# Patient Record
Sex: Female | Born: 1970
Health system: Southern US, Community
[De-identification: ages and names within clinical notes are randomized; demographics above are authoritative.]

## PROBLEM LIST (undated history)

## (undated) DIAGNOSIS — N289 Disorder of kidney and ureter, unspecified: Secondary | ICD-10-CM

## (undated) DIAGNOSIS — E119 Type 2 diabetes mellitus without complications: Secondary | ICD-10-CM

## (undated) DIAGNOSIS — I10 Essential (primary) hypertension: Secondary | ICD-10-CM

## (undated) DIAGNOSIS — R1310 Dysphagia, unspecified: Secondary | ICD-10-CM

## (undated) DIAGNOSIS — E039 Hypothyroidism, unspecified: Secondary | ICD-10-CM

## (undated) DIAGNOSIS — K829 Disease of gallbladder, unspecified: Secondary | ICD-10-CM

## (undated) DIAGNOSIS — K219 Gastro-esophageal reflux disease without esophagitis: Secondary | ICD-10-CM

## (undated) DIAGNOSIS — J40 Bronchitis, not specified as acute or chronic: Secondary | ICD-10-CM

## (undated) DIAGNOSIS — I499 Cardiac arrhythmia, unspecified: Secondary | ICD-10-CM

## (undated) DIAGNOSIS — D329 Benign neoplasm of meninges, unspecified: Secondary | ICD-10-CM

## (undated) DIAGNOSIS — F32A Depression, unspecified: Secondary | ICD-10-CM

## (undated) DIAGNOSIS — G629 Polyneuropathy, unspecified: Secondary | ICD-10-CM

## (undated) DIAGNOSIS — Z9889 Other specified postprocedural states: Secondary | ICD-10-CM

## (undated) DIAGNOSIS — R112 Nausea with vomiting, unspecified: Secondary | ICD-10-CM

## (undated) DIAGNOSIS — E221 Hyperprolactinemia: Secondary | ICD-10-CM

## (undated) DIAGNOSIS — H8110 Benign paroxysmal vertigo, unspecified ear: Secondary | ICD-10-CM

## (undated) DIAGNOSIS — R011 Cardiac murmur, unspecified: Secondary | ICD-10-CM

## (undated) DIAGNOSIS — K5900 Constipation, unspecified: Secondary | ICD-10-CM

## (undated) DIAGNOSIS — M2550 Pain in unspecified joint: Secondary | ICD-10-CM

## (undated) DIAGNOSIS — F319 Bipolar disorder, unspecified: Secondary | ICD-10-CM

## (undated) DIAGNOSIS — F419 Anxiety disorder, unspecified: Secondary | ICD-10-CM

## (undated) DIAGNOSIS — E559 Vitamin D deficiency, unspecified: Secondary | ICD-10-CM

## (undated) DIAGNOSIS — IMO0001 Reserved for inherently not codable concepts without codable children: Secondary | ICD-10-CM

## (undated) DIAGNOSIS — R600 Localized edema: Secondary | ICD-10-CM

## (undated) DIAGNOSIS — K8010 Calculus of gallbladder with chronic cholecystitis without obstruction: Secondary | ICD-10-CM

## (undated) DIAGNOSIS — E282 Polycystic ovarian syndrome: Secondary | ICD-10-CM

## (undated) DIAGNOSIS — T753XXA Motion sickness, initial encounter: Secondary | ICD-10-CM

## (undated) DIAGNOSIS — E785 Hyperlipidemia, unspecified: Secondary | ICD-10-CM

## (undated) DIAGNOSIS — G4730 Sleep apnea, unspecified: Secondary | ICD-10-CM

## (undated) DIAGNOSIS — R42 Dizziness and giddiness: Secondary | ICD-10-CM

## (undated) DIAGNOSIS — M549 Dorsalgia, unspecified: Secondary | ICD-10-CM

## (undated) HISTORY — DX: Type 2 diabetes mellitus without complications: E11.9

## (undated) HISTORY — DX: Benign neoplasm of meninges, unspecified: D32.9

## (undated) HISTORY — PX: GALLBLADDER SURGERY: SHX652

## (undated) HISTORY — DX: Disorder of kidney and ureter, unspecified: N28.9

## (undated) HISTORY — DX: Benign paroxysmal vertigo, unspecified ear: H81.10

## (undated) HISTORY — DX: Hypothyroidism, unspecified: E03.9

## (undated) HISTORY — DX: Dysphagia, unspecified: R13.10

## (undated) HISTORY — DX: Constipation, unspecified: K59.00

## (undated) HISTORY — DX: Hyperprolactinemia: E22.1

## (undated) HISTORY — DX: Pain in unspecified joint: M25.50

## (undated) HISTORY — DX: Localized edema: R60.0

## (undated) HISTORY — DX: Polyneuropathy, unspecified: G62.9

## (undated) HISTORY — DX: Anxiety disorder, unspecified: F41.9

## (undated) HISTORY — DX: Depression, unspecified: F32.A

## (undated) HISTORY — DX: Hyperlipidemia, unspecified: E78.5

## (undated) HISTORY — DX: Vitamin D deficiency, unspecified: E55.9

## (undated) HISTORY — DX: Disease of gallbladder, unspecified: K82.9

## (undated) HISTORY — DX: Dorsalgia, unspecified: M54.9

## (undated) HISTORY — DX: Calculus of gallbladder with chronic cholecystitis without obstruction: K80.10

## (undated) HISTORY — DX: Essential (primary) hypertension: I10

## (undated) HISTORY — PX: CHOLECYSTECTOMY: SHX55

## (undated) HISTORY — DX: Gastro-esophageal reflux disease without esophagitis: K21.9

## (undated) HISTORY — PX: DENTAL SURGERY: SHX609

## (undated) HISTORY — DX: Polycystic ovarian syndrome: E28.2

---

## 2010-07-12 HISTORY — PX: ABDOMINAL HYSTERECTOMY: SHX81

## 2010-07-12 HISTORY — PX: TOTAL ABDOMINAL HYSTERECTOMY: SHX209

## 2012-06-21 DIAGNOSIS — E039 Hypothyroidism, unspecified: Secondary | ICD-10-CM | POA: Insufficient documentation

## 2012-06-21 DIAGNOSIS — E221 Hyperprolactinemia: Secondary | ICD-10-CM | POA: Insufficient documentation

## 2012-06-21 DIAGNOSIS — E063 Autoimmune thyroiditis: Secondary | ICD-10-CM | POA: Insufficient documentation

## 2012-06-21 DIAGNOSIS — E282 Polycystic ovarian syndrome: Secondary | ICD-10-CM | POA: Insufficient documentation

## 2012-06-21 DIAGNOSIS — R635 Abnormal weight gain: Secondary | ICD-10-CM | POA: Insufficient documentation

## 2012-06-21 HISTORY — DX: Hyperprolactinemia: E22.1

## 2012-06-21 HISTORY — DX: Polycystic ovarian syndrome: E28.2

## 2012-06-21 HISTORY — DX: Hypothyroidism, unspecified: E03.9

## 2012-08-08 DIAGNOSIS — R110 Nausea: Secondary | ICD-10-CM | POA: Insufficient documentation

## 2012-08-14 DIAGNOSIS — E119 Type 2 diabetes mellitus without complications: Secondary | ICD-10-CM

## 2012-08-14 DIAGNOSIS — K8010 Calculus of gallbladder with chronic cholecystitis without obstruction: Secondary | ICD-10-CM

## 2012-08-14 DIAGNOSIS — R899 Unspecified abnormal finding in specimens from other organs, systems and tissues: Secondary | ICD-10-CM | POA: Insufficient documentation

## 2012-08-14 DIAGNOSIS — F309 Manic episode, unspecified: Secondary | ICD-10-CM | POA: Insufficient documentation

## 2012-08-14 DIAGNOSIS — K8020 Calculus of gallbladder without cholecystitis without obstruction: Secondary | ICD-10-CM | POA: Insufficient documentation

## 2012-08-14 DIAGNOSIS — D3000 Benign neoplasm of unspecified kidney: Secondary | ICD-10-CM | POA: Insufficient documentation

## 2012-08-14 DIAGNOSIS — F319 Bipolar disorder, unspecified: Secondary | ICD-10-CM | POA: Insufficient documentation

## 2012-08-14 DIAGNOSIS — D1771 Benign lipomatous neoplasm of kidney: Secondary | ICD-10-CM | POA: Insufficient documentation

## 2012-08-14 DIAGNOSIS — R7303 Prediabetes: Secondary | ICD-10-CM | POA: Insufficient documentation

## 2012-08-14 HISTORY — DX: Calculus of gallbladder with chronic cholecystitis without obstruction: K80.10

## 2012-08-14 HISTORY — DX: Type 2 diabetes mellitus without complications: E11.9

## 2013-08-25 ENCOUNTER — Ambulatory Visit: Payer: Self-pay | Admitting: Emergency Medicine

## 2013-08-25 LAB — RAPID STREP-A WITH REFLX: Micro Text Report: NEGATIVE

## 2013-08-28 LAB — BETA STREP CULTURE(ARMC)

## 2013-08-31 ENCOUNTER — Ambulatory Visit: Payer: Self-pay

## 2014-02-14 DIAGNOSIS — H8110 Benign paroxysmal vertigo, unspecified ear: Secondary | ICD-10-CM | POA: Insufficient documentation

## 2014-02-14 DIAGNOSIS — I10 Essential (primary) hypertension: Secondary | ICD-10-CM | POA: Insufficient documentation

## 2014-02-14 DIAGNOSIS — I152 Hypertension secondary to endocrine disorders: Secondary | ICD-10-CM | POA: Insufficient documentation

## 2014-02-14 HISTORY — DX: Essential (primary) hypertension: I10

## 2014-02-26 ENCOUNTER — Ambulatory Visit: Payer: Self-pay | Admitting: Physician Assistant

## 2014-03-30 ENCOUNTER — Ambulatory Visit: Payer: Self-pay

## 2014-05-06 ENCOUNTER — Ambulatory Visit: Payer: Self-pay | Admitting: Physician Assistant

## 2014-05-06 LAB — RAPID STREP-A WITH REFLX: MICRO TEXT REPORT: NEGATIVE

## 2014-05-08 LAB — BETA STREP CULTURE(ARMC)

## 2014-07-12 DIAGNOSIS — R42 Dizziness and giddiness: Secondary | ICD-10-CM

## 2014-07-12 HISTORY — DX: Dizziness and giddiness: R42

## 2014-09-24 DIAGNOSIS — R002 Palpitations: Secondary | ICD-10-CM | POA: Insufficient documentation

## 2015-03-01 ENCOUNTER — Ambulatory Visit
Admission: EM | Admit: 2015-03-01 | Discharge: 2015-03-01 | Disposition: A | Discharge status: Home / Self Care | Payer: 59 | Attending: Family Medicine | Admitting: Family Medicine

## 2015-03-01 ENCOUNTER — Encounter: Payer: Self-pay | Admitting: Emergency Medicine

## 2015-03-01 DIAGNOSIS — T148 Other injury of unspecified body region: Secondary | ICD-10-CM

## 2015-03-01 DIAGNOSIS — W57XXXA Bitten or stung by nonvenomous insect and other nonvenomous arthropods, initial encounter: Secondary | ICD-10-CM | POA: Diagnosis not present

## 2015-03-01 DIAGNOSIS — R197 Diarrhea, unspecified: Secondary | ICD-10-CM | POA: Diagnosis not present

## 2015-03-01 DIAGNOSIS — J069 Acute upper respiratory infection, unspecified: Secondary | ICD-10-CM | POA: Diagnosis not present

## 2015-03-01 HISTORY — DX: Bipolar disorder, unspecified: F31.9

## 2015-03-01 HISTORY — DX: Type 2 diabetes mellitus without complications: E11.9

## 2015-03-01 HISTORY — DX: Essential (primary) hypertension: I10

## 2015-03-01 MED ORDER — MUPIROCIN 2 % EX OINT: 1.0000 "application " | TOPICAL_OINTMENT | Freq: Three times a day (TID) | CUTANEOUS | Status: DC

## 2015-03-01 MED ORDER — DOXYCYCLINE HYCLATE 100 MG PO CAPS: 100.0000 mg | ORAL_CAPSULE | Freq: Two times a day (BID) | ORAL | Status: DC

## 2015-03-01 MED ORDER — HYDROCOD POLST-CPM POLST ER 10-8 MG/5ML PO SUER: 5.0000 mL | Freq: Two times a day (BID) | ORAL | Status: DC

## 2015-03-01 NOTE — Discharge Instructions (Signed)
DEET Insect Repellent  DEET is a commonly used insect repellent. DEET is effective against mosquitoes, ticks, and chiggers.DEET is not effective against stinging insects, such as bees and wasps. When mosquitoes or ticks are active, take the following precautions.  Use DEET according to the directions on the label.  Wear protective clothing if you are outside in an area where there are weeds, tall grass, or bushes. This includes long pants, socks, and loose-fitting, long-sleeved shirts. Consider spraying DEET on your clothing. Avoid being outdoors in the early evening. This is when mosquitoes are most active.  Products with a low concentration of DEET (10% to 20%) may be useful in areas with few insects. Higher concentrations of DEET may be needed in areas with many insects. Repellents used on children should not contain more than 30% DEET. Although higher concentrations of DEET (up to 95%) are available for adults, they are not recommended for routine use. Concentrations higher than 50% do not provide additional protection. Depending on the concentration of DEET in a product, it can be effective for about 2 to 6 hours.  When applying DEET to children, use the lowest concentration that is effective. Ten percent DEET will last approximately 2 to 3 hours, while 30% will last 4 to 5 hours. Do not use DEET on infants younger than 2 months old. Do not apply DEET more often than once a day to children under the age of 2.  Avoid prolonged or excessive use of DEET. Use it sparingly to cover exposed skin and clothing. Adverse reactions to DEET in the recommended concentrations are uncommon. However, skin irritation can occur in some people.  Wash all treated skin and clothing with soap and water after returning indoors.  Do not allow children to apply insect repellent themselves.  Do not apply DEET near cuts or open wounds. You can apply DEET and sunscreen together. However, it is recommend that you apply  the sunscreen first.  Do not apply DEET to a child's hands or near a child's eyes and mouth. If DEET is accidentally sprayed in the eyes, wash the eyes out with large amounts of water.  Store DEET out of the reach of children.  Most authorities feel that it is safe to use DEET during pregnancy. However, pregnant women should only use insect repellents when they are in areas with a high risk of disease carried by insects (malaria, West Nile virus, encephalitis). Document Released: 03/23/2001 Document Revised: 11/12/2013 Document Reviewed: 03/17/2011 [facility] Patient Information 2015 [place], [place]. This information is not intended to replace advice given to you by your health care provider. Make sure you discuss any questions you have with your health care provider.  Diarrhea Diarrhea is frequent loose and watery bowel movements. It can cause you to feel weak and dehydrated. Dehydration can cause you to become tired and thirsty, have a dry mouth, and have decreased urination that often is dark yellow. Diarrhea is a sign of another problem, most often an infection that will not last long. In most cases, diarrhea typically lasts 2-3 days. However, it can last longer if it is a sign of something more serious. It is important to treat your diarrhea as directed by your caregiver to lessen or prevent future episodes of diarrhea. CAUSES  Some common causes include:  Gastrointestinal infections caused by viruses, bacteria, or parasites.  Food poisoning or food allergies.  Certain medicines, such as antibiotics, chemotherapy, and laxatives.  Artificial sweeteners and fructose.  Digestive disorders. HOME CARE INSTRUCTIONS  Ensure adequate fluid intake (hydration): Have 1 cup (8 oz) of fluid for each diarrhea episode. Avoid fluids that contain simple sugars or sports drinks, fruit juices, whole milk products, and sodas. Your urine should be clear or pale yellow if you are drinking enough fluids.  Hydrate with an oral rehydration solution that you can purchase at pharmacies, retail stores, and online. You can prepare an oral rehydration solution at home by mixing the following ingredients together:   - tsp table salt.   tsp baking soda.   tsp salt substitute containing potassium chloride.  1  tablespoons sugar.  1 L (34 oz) of water.  Certain foods and beverages may increase the speed at which food moves through the gastrointestinal (GI) tract. These foods and beverages should be avoided and include:  Caffeinated and alcoholic beverages.  High-fiber foods, such as raw fruits and vegetables, nuts, seeds, and whole grain breads and cereals.  Foods and beverages sweetened with sugar alcohols, such as xylitol, sorbitol, and mannitol.  Some foods may be well tolerated and may help thicken stool including:  Starchy foods, such as rice, toast, pasta, low-sugar cereal, oatmeal, grits, baked potatoes, crackers, and bagels.  Bananas.  Applesauce.  Add probiotic-rich foods to help increase healthy bacteria in the GI tract, such as yogurt and fermented milk products.  Wash your hands well after each diarrhea episode.  Only take over-the-counter or prescription medicines as directed by your caregiver.  Take a warm bath to relieve any burning or pain from frequent diarrhea episodes. SEEK IMMEDIATE MEDICAL CARE IF:   You are unable to keep fluids down.  You have persistent vomiting.  You have blood in your stool, or your stools are black and tarry.  You do not urinate in 6-8 hours, or there is only a small amount of very dark urine.  You have abdominal pain that increases or localizes.  You have weakness, dizziness, confusion, or light-headedness.  You have a severe headache.  Your diarrhea gets worse or does not get better.  You have a fever or persistent symptoms for more than 2-3 days.  You have a fever and your symptoms suddenly get worse. MAKE SURE YOU:    Understand these instructions.  Will watch your condition.  Will get help right away if you are not doing well or get worse. Document Released: 06/18/2002 Document Revised: 11/12/2013 Document Reviewed: 03/05/2012 [facility] Patient Information 2015 [place], [place]. This information is not intended to replace advice given to you by your health care provider. Make sure you discuss any questions you have with your health care provider.  Insect Bite Mosquitoes, flies, fleas, bedbugs, and many other insects can bite. Insect bites are different from insect stings. A sting is when venom is injected into the skin. Some insect bites can transmit infectious diseases. SYMPTOMS  Insect bites usually turn red, swell, and itch for 2 to 4 days. They often go away on their own. TREATMENT  Your caregiver may prescribe antibiotic medicines if a bacterial infection develops in the bite. HOME CARE INSTRUCTIONS  Do not scratch the bite area.  Keep the bite area clean and dry. Wash the bite area thoroughly with soap and water.  Put ice or cool compresses on the bite area.  Put ice in a plastic bag.  Place a towel between your skin and the bag.  Leave the ice on for 20 minutes, 4 times a day for the first 2 to 3 days, or as directed.  You may apply a  baking soda paste, cortisone cream, or calamine lotion to the bite area as directed by your caregiver. This can help reduce itching and swelling.  Only take over-the-counter or prescription medicines as directed by your caregiver.  If you are given antibiotics, take them as directed. Finish them even if you start to feel better. You may need a tetanus shot if:  You cannot remember when you had your last tetanus shot.  You have never had a tetanus shot.  The injury broke your skin. If you get a tetanus shot, your arm may swell, get red, and feel warm to the touch. This is common and not a problem. If you need a tetanus shot and you choose not to have  one, there is a rare chance of getting tetanus. Sickness from tetanus can be serious. SEEK IMMEDIATE MEDICAL CARE IF:   You have increased pain, redness, or swelling in the bite area.  You see a red line on the skin coming from the bite.  You have a fever.  You have joint pain.  You have a headache or neck pain.  You have unusual weakness.  You have a rash.  You have chest pain or shortness of breath.  You have abdominal pain, nausea, or vomiting.  You feel unusually tired or sleepy. MAKE SURE YOU:   Understand these instructions.  Will watch your condition.  Will get help right away if you are not doing well or get worse. Document Released: 08/05/2004 Document Revised: 09/20/2011 Document Reviewed: 01/27/2011 [facility] Patient Information 2015 [place], [place]. This information is not intended to replace advice given to you by your health care provider. Make sure you discuss any questions you have with your health care provider.  Upper Respiratory Infection, Adult An upper respiratory infection (URI) is also sometimes known as the common cold. The upper respiratory tract includes the nose, sinuses, throat, trachea, and bronchi. Bronchi are the airways leading to the lungs. Most people improve within 1 week, but symptoms can last up to 2 weeks. A residual cough may last even longer.  CAUSES Many different viruses can infect the tissues lining the upper respiratory tract. The tissues become irritated and inflamed and often become very moist. Mucus production is also common. A cold is contagious. You can easily spread the virus to others by oral contact. This includes kissing, sharing a glass, coughing, or sneezing. Touching your mouth or nose and then touching a surface, which is then touched by another person, can also spread the virus. SYMPTOMS  Symptoms typically develop 1 to 3 days after you come in contact with a cold virus. Symptoms vary from person to person. They may  include:  Runny nose.  Sneezing.  Nasal congestion.  Sinus irritation.  Sore throat.  Loss of voice (laryngitis).  Cough.  Fatigue.  Muscle aches.  Loss of appetite.  Headache.  Low-grade fever. DIAGNOSIS  You might diagnose your own cold based on familiar symptoms, since most people get a cold 2 to 3 times a year. Your caregiver can confirm this based on your exam. Most importantly, your caregiver can check that your symptoms are not due to another disease such as strep throat, sinusitis, pneumonia, asthma, or epiglottitis. Blood tests, throat tests, and X-rays are not necessary to diagnose a common cold, but they may sometimes be helpful in excluding other more serious diseases. Your caregiver will decide if any further tests are required. RISKS AND COMPLICATIONS  You may be at risk for a more severe case  of the common cold if you smoke cigarettes, have chronic heart disease (such as heart failure) or lung disease (such as asthma), or if you have a weakened immune system. The very young and very old are also at risk for more serious infections. Bacterial sinusitis, middle ear infections, and bacterial pneumonia can complicate the common cold. The common cold can worsen asthma and chronic obstructive pulmonary disease (COPD). Sometimes, these complications can require emergency medical care and may be life-threatening. PREVENTION  The best way to protect against getting a cold is to practice good hygiene. Avoid oral or hand contact with people with cold symptoms. Wash your hands often if contact occurs. There is no clear evidence that vitamin C, vitamin E, echinacea, or exercise reduces the chance of developing a cold. However, it is always recommended to get plenty of rest and practice good nutrition. TREATMENT  Treatment is directed at relieving symptoms. There is no cure. Antibiotics are not effective, because the infection is caused by a virus, not by bacteria. Treatment may  include:  Increased fluid intake. Sports drinks offer valuable electrolytes, sugars, and fluids.  Breathing heated mist or steam (vaporizer or shower).  Eating chicken soup or other clear broths, and maintaining good nutrition.  Getting plenty of rest.  Using gargles or lozenges for comfort.  Controlling fevers with ibuprofen or acetaminophen as directed by your caregiver.  Increasing usage of your inhaler if you have asthma. Zinc gel and zinc lozenges, taken in the first 24 hours of the common cold, can shorten the duration and lessen the severity of symptoms. Pain medicines may help with fever, muscle aches, and throat pain. A variety of non-prescription medicines are available to treat congestion and runny nose. Your caregiver can make recommendations and may suggest nasal or lung inhalers for other symptoms.  HOME CARE INSTRUCTIONS   Only take over-the-counter or prescription medicines for pain, discomfort, or fever as directed by your caregiver.  Use a warm mist humidifier or inhale steam from a shower to increase air moisture. This may keep secretions moist and make it easier to breathe.  Drink enough water and fluids to keep your urine clear or pale yellow.  Rest as needed.  Return to work when your temperature has returned to normal or as your caregiver advises. You may need to stay home longer to avoid infecting others. You can also use a face mask and careful hand washing to prevent spread of the virus. SEEK MEDICAL CARE IF:   After the first few days, you feel you are getting worse rather than better.  You need your caregiver's advice about medicines to control symptoms.  You develop chills, worsening shortness of breath, or brown or red sputum. These may be signs of pneumonia.  You develop yellow or brown nasal discharge or pain in the face, especially when you bend forward. These may be signs of sinusitis.  You develop a fever, swollen neck glands, pain with  swallowing, or white areas in the back of your throat. These may be signs of strep throat. SEEK IMMEDIATE MEDICAL CARE IF:   You have a fever.  You develop severe or persistent headache, ear pain, sinus pain, or chest pain.  You develop wheezing, a prolonged cough, cough up blood, or have a change in your usual mucus (if you have chronic lung disease).  You develop sore muscles or a stiff neck. Document Released: 12/22/2000 Document Revised: 09/20/2011 Document Reviewed: 10/03/2013 [facility] Patient Information 2015 [place], [place]. This information is not  intended to replace advice given to you by your health care provider. Make sure you discuss any questions you have with your health care provider. ° °

## 2015-03-01 NOTE — ED Provider Notes (Signed)
CSN: [id number]     Arrival date & time 03/01/15  1246 History   First MD Initiated Contact with Patient 03/01/15 1434     Chief Complaint  Patient presents with  . Rash  . Cough   (Consider location/radiation/quality/duration/timing/severity/associated sxs/prior Treatment) HPI   A 44 year old female who presents with a rash in her lower extremities in addition to a cough that is productive of white phlegm and diarrhea. States that she was on a vacation up in [place] when she was sitting around a campfire on a low stool and she awoke with bug bites all over her lower legs. These are confined to below the knee mostly on her lower half of her leg and her feet more on the right than the left and more on the anterior surfaces in the posterior surface. His one small area on her left index finger and another that was in her left palm that has since improved. Her cough seems to occur yearly. He keeps her awake at night and is a rapidly type of cough with very minimal production. She quit smoking in 2004. Her boyfriend also has diarrhea been having 6-7 bowel movements per day for 6 days. She also noticed this on her trip and may be related to tainted food. No blood or mucus in the stool. The stool has some form is rather soft. She denies any dark tarry nystatin stool. She has no significant nausea or vomiting. She denies any abdominal pain.  Past Medical History  Diagnosis Date  . Diabetes mellitus without complication     pre-diabetic  . Hypertension   . Bipolar 1 disorder    Past Surgical History  Procedure Laterality Date  . Abdominal hysterectomy    . Gallbladder surgery     No family history on file. Social History  Substance Use Topics  . Smoking status: Former Research scientist (life sciences)  . Smokeless tobacco: Never Used  . Alcohol Use: No   OB History    No data available     Review of Systems  Constitutional: Positive for appetite change and fatigue. Negative for fever and chills.  HENT: Positive  for congestion.   Respiratory: Positive for cough.   Gastrointestinal: Positive for diarrhea.  Skin: Positive for rash.    Allergies  Lisinopril  Home Medications   Prior to Admission medications   Medication Sig Start Date End Date Taking? Authorizing Provider  lamoTRIgine (LAMICTAL) 200 MG tablet Take 200 mg by mouth daily.   Yes Historical Provider, MD  levothyroxine (SYNTHROID, LEVOTHROID) 112 MCG tablet Take 112 mcg by mouth daily before breakfast.   Yes Historical Provider, MD  losartan (COZAAR) 50 MG tablet Take 50 mg by mouth daily.   Yes Historical Provider, MD  metFORMIN (GLUCOPHAGE) 500 MG tablet Take 500 mg by mouth 2 (two) times daily with a meal.   Yes Historical Provider, MD  ziprasidone (GEODON) 60 MG capsule Take 60 mg by mouth 2 (two) times daily with a meal.   Yes Historical Provider, MD  chlorpheniramine-HYDROcodone (TUSSIONEX PENNKINETIC ER) 10-8 MG/5ML SUER Take 5 mLs by mouth 2 (two) times daily. 03/01/15   [name], PA-C  doxycycline (VIBRAMYCIN) 100 MG capsule Take 1 capsule (100 mg total) by mouth 2 (two) times daily. 03/01/15   [name], PA-C  mupirocin ointment (BACTROBAN) 2 % Apply 1 application topically 3 (three) times daily. 03/01/15   [name], PA-C   BP 118/85 mmHg  Pulse 73  Temp(Src) 97.9 F (36.6 C) (  Tympanic)  Resp 16  Ht 5\' 7"  (1.702 m)  Wt 265 lb (120.203 kg)  BMI 41.50 kg/m2  SpO2 97% Physical Exam  Constitutional: She is oriented to person, place, and time. She appears well-developed and well-nourished.  HENT:  Head: Normocephalic and atraumatic.  Right Ear: External ear normal.  Left Ear: External ear normal.  Eyes: Pupils are equal, round, and reactive to light.  Neck: Neck supple.  Pulmonary/Chest: Effort normal and breath sounds normal. No stridor. No respiratory distress. She has no wheezes. She has no rales.  Abdominal: Soft. Bowel sounds are normal. She exhibits no distension. There is no tenderness. There  is no rebound and no guarding.  Musculoskeletal: Normal range of motion.  Lymphadenopathy:    She has no cervical adenopathy.  Neurological: She is alert and oriented to person, place, and time.  Skin: Skin is warm and dry. Rash noted.  Mission of the legs shows numerous scattered papules on an erythematous base that is blanchable. The papules are 1-3 mm in diameter and scattered luster on the lower extremities. Several little further up on the knees and she has one on the dorsum of the left index finger. There are excoriations present.  Psychiatric: She has a normal mood and affect. Her behavior is normal. Judgment and thought content normal.  Nursing note and vitals reviewed.   ED Course  Procedures (including critical care time) Labs Review Labs Reviewed - No data to display  Imaging Review No results found.   MDM   1. Diarrhea   2. Insect bites   3. URI, acute    New Prescriptions   CHLORPHENIRAMINE-HYDROCODONE (TUSSIONEX PENNKINETIC ER) 10-8 MG/5ML SUER    Take 5 mLs by mouth 2 (two) times daily.   DOXYCYCLINE (VIBRAMYCIN) 100 MG CAPSULE    Take 1 capsule (100 mg total) by mouth 2 (two) times daily.   MUPIROCIN OINTMENT (BACTROBAN) 2 %    Apply 1 application topically 3 (three) times daily.  Plan: 1. Diagnosis reviewed with patient 2. rx as per orders; risks, benefits, potential side effects reviewed with patient 3. Recommend supportive treatment with fluids,BRAT diet 4. F/u prn if symptoms worsen or don't improve with PCP     [name], PA-C 03/01/15 1515

## 2015-03-01 NOTE — ED Notes (Signed)
Rash on legs for 1 week. Was at a camp fire and woke up with rash. Also has a cough, diarrhea for 1 week

## 2015-04-08 ENCOUNTER — Encounter: Payer: Self-pay | Admitting: Emergency Medicine

## 2015-04-08 ENCOUNTER — Ambulatory Visit
Admission: EM | Admit: 2015-04-08 | Discharge: 2015-04-08 | Disposition: A | Discharge status: Home / Self Care | Payer: 59 | Attending: Emergency Medicine | Admitting: Emergency Medicine

## 2015-04-08 DIAGNOSIS — S0990XA Unspecified injury of head, initial encounter: Secondary | ICD-10-CM | POA: Diagnosis not present

## 2015-04-08 MED ORDER — ONDANSETRON 4 MG PO TBDP
4.0000 mg | ORAL_TABLET | Freq: Once | ORAL | Status: AC
  Administered 2015-04-08: 4 mg via ORAL

## 2015-04-08 MED ORDER — ONDANSETRON 4 MG PO TBDP: 4.0000 mg | ORAL_TABLET | Freq: Three times a day (TID) | ORAL | Status: DC | PRN

## 2015-04-08 NOTE — ED Notes (Signed)
Pt hit forehead on a truck lid last PM

## 2015-04-08 NOTE — Discharge Instructions (Signed)
Take zofran as needed for nausea. Take over the counter tylenol as needed. Rest. Drink plenty of water. Avoid strenuous activity, no contact sports.   Follow up with your primary care physician this week as discussed. Follow up in 2-3 days.   Return to Urgent care or go to ER immediately for worsening headache, vomiting, neck pain, confusion, abnormal behavior, new or worsening concerns.     Head Injury  You have received a head injury. It does not appear serious at this time. Headaches and vomiting are common following head injury. It should be easy to awaken from sleeping. Sometimes it is necessary for you to stay in the emergency department for a while for observation. Sometimes admission to the hospital may be needed. After injuries such as yours, most problems occur within the first 24 hours, but side effects may occur up to 7-10 days after the injury. It is important for you to carefully monitor your condition and contact your health care provider or seek immediate medical care if there is a change in your condition. WHAT ARE THE TYPES OF HEAD INJURIES? Head injuries can be as minor as a bump. Some head injuries can be more severe. More severe head injuries include:  A jarring injury to the brain (concussion).  A bruise of the brain (contusion). This mean there is bleeding in the brain that can cause swelling.  A cracked skull (skull fracture).  Bleeding in the brain that collects, clots, and forms a bump (hematoma). WHAT CAUSES A HEAD INJURY? A serious head injury is most likely to happen to someone who is in a car wreck and is not wearing a seat belt. Other causes of major head injuries include bicycle or motorcycle accidents, sports injuries, and falls. HOW ARE HEAD INJURIES DIAGNOSED? A complete history of the event leading to the injury and your current symptoms will be helpful in diagnosing head injuries. Many times, pictures of the brain, such as CT or MRI are needed to see the  extent of the injury. Often, an overnight hospital stay is necessary for observation.  WHEN SHOULD I SEEK IMMEDIATE MEDICAL CARE?  You should get help right away if:  You have confusion or drowsiness.  You feel sick to your stomach (nauseous) or have continued, forceful vomiting.  You have dizziness or unsteadiness that is getting worse.  You have severe, continued headaches not relieved by medicine. Only take over-the-counter or prescription medicines for pain, fever, or discomfort as directed by your health care provider.  You do not have normal function of the arms or legs or are unable to walk.  You notice changes in the black spots in the center of the colored part of your eye (pupil).  You have a clear or bloody fluid coming from your nose or ears.  You have a loss of vision. During the next 24 hours after the injury, you must stay with someone who can watch you for the warning signs. This person should contact local emergency services (911 in the [place] if you have seizures, you become unconscious, or you are unable to wake up. HOW CAN I PREVENT A HEAD INJURY IN THE FUTURE? The most important factor for preventing major head injuries is avoiding motor vehicle accidents. To minimize the potential for damage to your head, it is crucial to wear seat belts while riding in motor vehicles. Wearing helmets while bike riding and playing collision sports (like football) is also helpful. Also, avoiding dangerous activities around the house will further  help reduce your risk of head injury.  WHEN CAN I RETURN TO NORMAL ACTIVITIES AND ATHLETICS? You should be reevaluated by your health care provider before returning to these activities. If you have any of the following symptoms, you should not return to activities or contact sports until 1 week after the symptoms have stopped:  Persistent headache.  Dizziness or vertigo.  Poor attention and concentration.  Confusion.  Memory  problems.  Nausea or vomiting.  Fatigue or tire easily.  Irritability.  Intolerant of bright lights or loud noises.  Anxiety or depression.  Disturbed sleep. MAKE SURE YOU:   Understand these instructions.  Will watch your condition.  Will get help right away if you are not doing well or get worse. Document Released: 06/28/2005 Document Revised: 07/03/2013 Document Reviewed: 03/05/2013 [facility] Patient Information 2015 [place], [place]. This information is not intended to replace advice given to you by your health care provider. Make sure you discuss any questions you have with your health care provider.

## 2015-04-08 NOTE — ED Provider Notes (Signed)
[facility] Emergency Department Provider Note  ____________________________________________  Time seen: Approximately 1:21 PM  I have reviewed the triage vital signs and the nursing notes.   HISTORY  Chief Complaint Head Injury   HPI Lori Knight is a 44 y.o. female  Presents for complaints of head injury. Patient reports last night approximately 8 pm she was reaching in her car trunk to get her groceries, and when she reached in, she states she reached too far in and hit her forehead on the trunk edge. States the trunk did NOT fall on her or hit her, but states she hit her forehead when she reached in for the groceries. Denies loss of consciousness. States she did NOT fall. States she had intermittent headache last night to same area that she hit her head at. Also reports she has had some nausea intermittently. States occasional dizziness last night with position changes. Denies fall. Denies LOC. Denies other head injury or other injury.   States today she has had some intermittent right sided headache, as well as intermittent nausea. States current right frontal headache at 4/10 described as an aching pain around right forehead where she hit her head. Denies pain radiation. States she has not taken anything for headache. Denies current dizziness. Denies vision changes. Denies vomiting. Denies weakness, neck pain, back pain, or other changes. Reports has continued to eat and drink well. Patient states she wanted to make sure everything was ok.    Past Medical History  Diagnosis Date  . Diabetes mellitus without complication     pre-diabetic  . Hypertension   . Bipolar 1 disorder     There are no active problems to display for this patient.   Past Surgical History  Procedure Laterality Date  . Abdominal hysterectomy    . Gallbladder surgery      Current Outpatient Rx  Name  Route  Sig  Dispense  Refill  .           .           . lamoTRIgine  (LAMICTAL) 200 MG tablet   Oral   Take 200 mg by mouth daily.         Marland Kitchen levothyroxine (SYNTHROID, LEVOTHROID) 112 MCG tablet   Oral   Take 112 mcg by mouth daily before breakfast.         . losartan (COZAAR) 50 MG tablet   Oral   Take 50 mg by mouth daily.         . metFORMIN (GLUCOPHAGE) 500 MG tablet   Oral   Take 500 mg by mouth 2 (two) times daily with a meal.                    . ziprasidone (GEODON) 60 MG capsule   Oral   Take 60 mg by mouth 2 (two) times daily with a meal.           Allergies Lisinopril  History reviewed. No pertinent family history.  Social History Social History  Substance Use Topics  . Smoking status: Former Research scientist (life sciences)  . Smokeless tobacco: Never Used  . Alcohol Use: No    Review of Systems Constitutional: No fever/chills Eyes: No visual changes. ENT: No sore throat. Cardiovascular: Denies chest pain. Respiratory: Denies shortness of breath. Gastrointestinal: No abdominal pain.  Positive for intermittent nausea, no vomiting.  No diarrhea.  No constipation. Genitourinary: Negative for dysuria. Musculoskeletal: Negative for back pain. Skin: Negative for rash. Neurological:  Negative focal weakness or numbness. Positive for intermittent headache as above.   10-point ROS otherwise negative.  ____________________________________________   PHYSICAL EXAM:  VITAL SIGNS: ED Triage Vitals  Enc Vitals Group     BP 04/08/15 1230 123/77 mmHg     Pulse Rate 04/08/15 1230 67     Resp 04/08/15 1230 20     Temp 04/08/15 1230 98.9 F (37.2 C)     Temp Source 04/08/15 1230 Tympanic     SpO2 04/08/15 1230 99 %     Weight 04/08/15 1230 246 lb (111.585 kg)     Height 04/08/15 1230 5\' 7"  (1.702 m)     Head Cir --      Peak Flow --      Pain Score 04/08/15 1232 5     Pain Loc --      Pain Edu? --      Excl. in [place]? --     Constitutional: Alert and oriented. Well appearing and in no acute distress. Eyes: Conjunctivae are normal. PERRL.  EOMI. Head: Atraumatic. Right forehead minimal TTP, no swelling, no ecchymosis. Skin intact.   Ears: no erythema, normal TMs bilaterally.   Nose: No congestion/rhinnorhea.  Mouth/Throat: Mucous membranes are moist.  Oropharynx non-erythematous. Neck: No stridor.  No cervical spine tenderness to palpation. Hematological/Lymphatic/Immunilogical: No cervical lymphadenopathy. Cardiovascular: Normal rate, regular rhythm. Grossly normal heart sounds.  Good peripheral circulation. Respiratory: Normal respiratory effort.  No retractions. Lungs CTAB. Gastrointestinal: Soft and nontender. No distention. Normal Bowel sounds.  No abdominal bruits. No CVA tenderness. Musculoskeletal: No lower or upper extremity tenderness nor edema.  No joint effusions. Bilateral pedal pulses equal and easily palpated. No midline cervical, thoracic or lumbar TTP. Mild right trapezius muscular TTP, no bony tenderness and no cervical tenderness. Full cervical ROM, and no pain with cervical ROM.  Neurologic:  Normal speech and language. No gross focal neurologic deficits are appreciated. No gait instability.  No ataxia, normal finger to nose. Negative Romberg. Negative brudzkinski's and negative kernig's. No meningismus. CN 2-12 grossly intact.  Skin:  Skin is warm, dry and intact. No rash noted. Psychiatric: Mood and affect are normal. Speech and behavior are normal.   [place] CT head/trauma rule utilized: Recommends NO CT head.  [place] C-spine rule utilized: no c-spine imaging recommended.   LABS (all labs ordered are listed, but only abnormal results are displayed)  Labs Reviewed - No data to display ____________________________________   INITIAL IMPRESSION / ASSESSMENT AND PLAN / ED COURSE  Pertinent labs & imaging results that were available during my care of the patient were reviewed by me and considered in my medical decision making (see chart for details).  Very well appearing patient. No acute distress. No  focal neurological deficits. Ambulatory with steady gait. Drove self to Urgent care. Present for complaints of evaluation post head injury which occurred greater than 16 hours ago. Patient bent and leaned into trunk of car to get groceries and hit right forehead on car edge. Canadian Ct and c-spine rules utilized and recommends no CT head or c-spine imaging. Patient alert and oriented with decisional capacity. Patient states she does NOT want any imaging of her head or her neck performed at this time.  Discussed with patient strict follow up and return parameters regarding recent head injury. Will treat nausea with prn zofran. Counseled resting, fluids, avoidance of strenuous activity as well as close PCP follow up. Follow up with PCP in 2-3 days. Discussed follow up with Primary care  physician this week in 2 days. Discussed follow up and return parameters including no resolution or any worsening concerns. Patient verbalized understanding and agreed to plan.   ____________________________________________   FINAL CLINICAL IMPRESSION(S) / ED DIAGNOSES  Final diagnoses:  Head injury, initial encounter       Marylene Land, NP 04/08/15 [place], NP 04/08/15 1637

## 2015-05-21 ENCOUNTER — Ambulatory Visit
Admission: EM | Admit: 2015-05-21 | Discharge: 2015-05-21 | Disposition: A | Discharge status: Home / Self Care | Payer: 59 | Attending: Family Medicine | Admitting: Family Medicine

## 2015-05-21 ENCOUNTER — Encounter: Payer: Self-pay | Admitting: Emergency Medicine

## 2015-05-21 DIAGNOSIS — L089 Local infection of the skin and subcutaneous tissue, unspecified: Secondary | ICD-10-CM

## 2015-05-21 DIAGNOSIS — L723 Sebaceous cyst: Secondary | ICD-10-CM | POA: Diagnosis not present

## 2015-05-21 MED ORDER — SULFAMETHOXAZOLE-TRIMETHOPRIM 800-160 MG PO TABS: 1.0000 | ORAL_TABLET | Freq: Two times a day (BID) | ORAL | Status: AC

## 2015-05-21 MED ORDER — LIDOCAINE-EPINEPHRINE-TETRACAINE (LET) SOLUTION
3.0000 mL | Freq: Once | NASAL | Status: AC
  Administered 2015-05-21: 3 mL via TOPICAL

## 2015-05-21 MED ORDER — LIDOCAINE HCL (PF) 1 % IJ SOLN
5.0000 mL | Freq: Once | INTRAMUSCULAR | Status: AC
  Administered 2015-05-21: 5 mL

## 2015-05-21 NOTE — Discharge Instructions (Signed)
Keep clean. Apply warm compresses multiple times per day.   Return to Urgent care in 2-3 days for packing removal and wound check. Return sooner for fever, new or worsening concerns.   Sebaceous Cyst Removal Sebaceous cyst removal is a procedure to remove a sac of oily material that forms under your skin (sebaceous cyst). Sebaceous cysts may also be called epidermoid cysts or keratin cysts. Normally, the skin secretes this oily material through a gland or a hair follicle. This type of cyst usually results when a skin gland or hair follicle becomes blocked. You may need this procedure if you have a sebaceous cyst that becomes large, uncomfortable, or infected. LET [facility] CARE PROVIDER KNOW ABOUT:  Any allergies you have.  All medicines you are taking, including vitamins, herbs, eye drops, creams, and over-the-counter medicines.  Previous problems you or members of your family have had with the use of anesthetics.  Any blood disorders you have.  Previous surgeries you have had.  Medical conditions you have. RISKS AND COMPLICATIONS Generally, this is a safe procedure. However, problems may occur, including:  Developing another cyst.  Bleeding.  Infection.  Scarring. BEFORE THE PROCEDURE  Ask your health care provider about:  Changing or stopping your regular medicines. This is especially important if you are taking diabetes medicines or blood thinners.  Taking medicines such as aspirin and ibuprofen. These medicines can thin your blood. Do not take these medicines before your procedure if your health care provider instructs you not to.  If you have an infected cyst, you may have to take antibiotic medicines before or after the cyst removal. Take your antibiotics as directed by your health care provider. Finish all of the medicine even if you start to feel better.  Take a shower on the morning of your procedure. Your health care provider may ask you to use a germ-killing  (antiseptic) soap. PROCEDURE  You will be given a medicine that numbs the area (local anesthetic).  The skin around the cyst will be cleaned with a germ-killing solution (antiseptic).  Your health care provider will make a small surgical incision over the cyst.  The cyst will be separated from the surrounding tissues that are under your skin.  If possible, the cyst will be removed undamaged (intact).  If the cyst bursts (ruptures), it will need to be removed in pieces.  After the cyst is removed, your health care provider will control any bleeding and close the incision with small stitches (sutures). Small incisions may not need sutures, and the bleeding will be controlled by applying direct pressure with gauze.  Your health care provider may apply antibiotic ointment and a light bandage (dressing) over the incision. This procedure may vary among health care providers and hospitals. AFTER THE PROCEDURE  If your cyst ruptured during surgery, you may need to take antibiotic medicine. If you were prescribed an antibiotic medicine, finish all of it even if you start to feel better.   This information is not intended to replace advice given to you by your health care provider. Make sure you discuss any questions you have with your health care provider.   Document Released: 06/25/2000 Document Revised: 07/19/2014 Document Reviewed: 03/13/2014 Elsevier Interactive Patient Education [employer].

## 2015-05-21 NOTE — ED Notes (Signed)
Pt with a knot on back x 2 weeks

## 2015-05-21 NOTE — ED Provider Notes (Signed)
[facility]  ____________________________________________  Time seen: Approximately 7:25 PM  I have reviewed the triage vital signs and the nursing notes.   HISTORY  Chief Complaint Cyst   HPI SHTERNA LARAMEE is a 44 y.o. female presents with a complaint of a tender swollen area to left posterior shoulder times proximal to 2 weeks. States that it has gradually gotten larger and appears red. States it is tender to touch at 5/10. States increases with pain as bra strap rubs against it. Denies fevers. States gradual onset. Denies other similar areas. Reports continues to eat and drink well.   States similar episode several months ago beneath left breast and reports negative MRSA and staph on wound culture at PCP office.     Past Medical History  Diagnosis Date  . Diabetes mellitus without complication ([place])     pre-diabetic  . Hypertension   . Bipolar 1 disorder ([place])     There are no active problems to display for this patient.   Past Surgical History  Procedure Laterality Date  . Abdominal hysterectomy    . Gallbladder surgery      Current Outpatient Rx  Name  Route  Sig  Dispense  Refill  .           .           . lamoTRIgine (LAMICTAL) 200 MG tablet   Oral   Take 200 mg by mouth daily.         Marland Kitchen levothyroxine (SYNTHROID, LEVOTHROID) 112 MCG tablet   Oral   Take 112 mcg by mouth daily before breakfast.         . losartan (COZAAR) 50 MG tablet   Oral   Take 50 mg by mouth daily.         . metFORMIN (GLUCOPHAGE) 500 MG tablet   Oral   Take 500 mg by mouth 2 (two) times daily with a meal.         .           .           . ziprasidone (GEODON) 60 MG capsule   Oral   Take 60 mg by mouth 2 (two) times daily with a meal.           Allergies Lisinopril  History reviewed. No pertinent family history.  Social History Social History  Substance Use Topics  . Smoking status: Former Research scientist (life sciences)  . Smokeless tobacco: Never Used  . Alcohol  Use: No    Review of Systems Constitutional: No fever/chills Eyes: No visual changes. ENT: No sore throat. Cardiovascular: Denies chest pain. Respiratory: Denies shortness of breath. Gastrointestinal: No abdominal pain.  No nausea, no vomiting.  No diarrhea.  No constipation. Genitourinary: Negative for dysuria. Musculoskeletal: Negative for back pain. Skin: Negative for rash. Tender red swollen area. Neurological: Negative for headaches, focal weakness or numbness.  10-point ROS otherwise negative.  ____________________________________________   PHYSICAL EXAM:  VITAL SIGNS: ED Triage Vitals  Enc Vitals Group     BP 05/21/15 1730 114/76 mmHg     Pulse Rate 05/21/15 1730 75     Resp 05/21/15 1730 20     Temp 05/21/15 1730 99.8 F (37.7 C)     Temp Source 05/21/15 1730 Oral     SpO2 05/21/15 1730 99 %     Weight 05/21/15 1730 273 lb (123.832 kg)     Height 05/21/15 1730 5\' 8"  (1.727 m)  Head Cir --      Peak Flow --      Pain Score 05/21/15 1731 4     Pain Loc --      Pain Edu? --      Excl. in [place]? --     Constitutional: Alert and oriented. Well appearing and in no acute distress. Eyes: Conjunctivae are normal. PERRL. EOMI. Head: Atraumatic.  Nose: No congestion/rhinnorhea.  Mouth/Throat: Mucous membranes are moist.   Neck: No stridor.  No cervical spine tenderness to palpation. Hematological/Lymphatic/Immunilogical: No cervical lymphadenopathy. Cardiovascular: Normal rate, regular rhythm. Grossly normal heart sounds.  Good peripheral circulation. Respiratory: Normal respiratory effort.  No retractions. Lungs CTAB. Gastrointestinal: Soft and nontender. No distention. Normal Bowel sounds.  Musculoskeletal: No lower or upper extremity tenderness nor edema. Bilateral pedal pulses equal and easily palpated.  Neurologic:  Normal speech and language. No gross focal neurologic deficits are appreciated. No gait instability. Skin:  Skin is warm, dry and intact. No rash  noted. Except: Left posterior shoulder upper back area with a 3 x 2 cm area of induration and fluctuance, no pointing abscess, round in shape with mild surrounding erythema. Mild to moderate tenderness to palpation. No other erythema, induration or swelling noted. Psychiatric: Mood and affect are normal. Speech and behavior are normal.  ____________________________________________   LABS (all labs ordered are listed, but only abnormal results are displayed)  Labs Reviewed - No data to display   PROCEDURES  Procedure(s) performed:  Procedure(s) performed:  Procedure explained and verbal consent obtained. Consent: Verbal consent obtained. Written consent not obtained. Risks and benefits: risks, benefits and alternatives were discussed Patient identity confirmed: verbally with patient and hospital-assigned identification number  Consent given by: patient   I&D abscess Location: left posterior shoulder Preparation: Patient was prepped and draped in the usual sterile fashion. Anesthesia with 1% Lidocaine 5 mls Irrigation solution: saline and betadine Amount of cleaning: copious Incision made with #11 blade scalpel Moderate amount of thick white drainage immediately obtained with expression. Sterile forceps used to probe and break up loculations and remove cyst sac. 1/4 " iodoform gauze used and packed. Small amount packing used.  Patient tolerate well. Wound well approximated post repair.  dressing applied.  Wound care instructions provided.  Observe for any signs of infection or other problems.     INITIAL IMPRESSION / ASSESSMENT AND PLAN / ED COURSE  Pertinent labs & imaging results that were available during my care of the patient were reviewed by me and considered in my medical decision making (see chart for details).  Very well-appearing patient. No acute distress. Presents for the complaints of left posterior shoulder tender red swollen area times proximally 2 weeks. Suspect  infected sebaceous cyst. Area incised and drained. Patient tolerated well. Small amount packing placed. Patient to apply warm compresses to assist in promoting drainage. Discussed dressing care. Return to urgent care in 2-3 days for packing removal and wound check. Oral Bactrim twice a day 7 days.Discussed follow up with Primary care physician this week. Discussed follow up and return parameters including no resolution or any worsening concerns. Patient verbalized understanding and agreed to plan.   ____________________________________________   FINAL CLINICAL IMPRESSION(S) / ED DIAGNOSES  Final diagnoses:  Infected sebaceous cyst of skin       Marylene Land, NP 05/21/15 1943

## 2015-05-24 ENCOUNTER — Ambulatory Visit
Admission: EM | Admit: 2015-05-24 | Discharge: 2015-05-24 | Disposition: A | Discharge status: Home / Self Care | Payer: 59 | Attending: Family Medicine | Admitting: Family Medicine

## 2015-05-24 DIAGNOSIS — R1084 Generalized abdominal pain: Secondary | ICD-10-CM

## 2015-05-24 DIAGNOSIS — Z09 Encounter for follow-up examination after completed treatment for conditions other than malignant neoplasm: Secondary | ICD-10-CM | POA: Diagnosis not present

## 2015-05-24 DIAGNOSIS — R197 Diarrhea, unspecified: Secondary | ICD-10-CM | POA: Diagnosis not present

## 2015-05-24 DIAGNOSIS — K625 Hemorrhage of anus and rectum: Secondary | ICD-10-CM

## 2015-05-24 LAB — COMPREHENSIVE METABOLIC PANEL
ALK PHOS: 81 U/L (ref 38–126)
ALT: 25 U/L (ref 14–54)
AST: 21 U/L (ref 15–41)
Albumin: 4.1 g/dL (ref 3.5–5.0)
Anion gap: 9 (ref 5–15)
BILIRUBIN TOTAL: 0.5 mg/dL (ref 0.3–1.2)
BUN: 15 mg/dL (ref 6–20)
CALCIUM: 8.8 mg/dL — AB (ref 8.9–10.3)
CO2: 27 mmol/L (ref 22–32)
CREATININE: 0.84 mg/dL (ref 0.44–1.00)
Chloride: 98 mmol/L — ABNORMAL LOW (ref 101–111)
GFR calc Af Amer: >60 mL/min (ref >60)
GFR calc non Af Amer: >60 mL/min (ref >60)
GLUCOSE: 109 mg/dL — AB (ref 65–99)
Potassium: 4.1 mmol/L (ref 3.5–5.1)
Sodium: 134 mmol/L — ABNORMAL LOW (ref 135–145)
TOTAL PROTEIN: 7.6 g/dL (ref 6.5–8.1)

## 2015-05-24 LAB — OCCULT BLOOD X 1 CARD TO LAB, STOOL: FECAL OCCULT BLD: POSITIVE — AB

## 2015-05-24 LAB — CBC WITH DIFFERENTIAL/PLATELET
Basophils Absolute: 0.1 10*3/uL (ref 0–0.1)
Basophils Relative: 1 %
EOS PCT: 2 %
Eosinophils Absolute: 0.1 10*3/uL (ref 0–0.7)
HCT: 45.2 % (ref 35.0–47.0)
Hemoglobin: 15 g/dL (ref 12.0–16.0)
LYMPHS ABS: 2.5 10*3/uL (ref 1.0–3.6)
LYMPHS PCT: 27 %
MCH: 29.7 pg (ref 26.0–34.0)
MCHC: 33.2 g/dL (ref 32.0–36.0)
MCV: 89.7 fL (ref 80.0–100.0)
MONO ABS: 0.7 10*3/uL (ref 0.2–0.9)
Monocytes Relative: 7 %
Neutro Abs: 6.1 10*3/uL (ref 1.4–6.5)
Neutrophils Relative %: 63 %
PLATELETS: 296 10*3/uL (ref 150–440)
RBC: 5.04 MIL/uL (ref 3.80–5.20)
RDW: 14 % (ref 11.5–14.5)
WBC: 9.5 10*3/uL (ref 3.6–11.0)

## 2015-05-24 LAB — LIPASE, BLOOD: Lipase: 36 U/L (ref 11–51)

## 2015-05-24 LAB — C DIFFICILE QUICK SCREEN W PCR REFLEX
C Diff antigen: NEGATIVE
C Diff interpretation: NEGATIVE
C Diff toxin: NEGATIVE

## 2015-05-24 LAB — AMYLASE: Amylase: 51 U/L (ref 28–100)

## 2015-05-24 MED ORDER — ONDANSETRON 8 MG PO TBDP: 8.0000 mg | ORAL_TABLET | Freq: Three times a day (TID) | ORAL | Status: DC | PRN

## 2015-05-24 MED ORDER — MUPIROCIN 2 % EX OINT: 1.0000 "application " | TOPICAL_OINTMENT | Freq: Three times a day (TID) | CUTANEOUS | Status: DC

## 2015-05-24 NOTE — ED Provider Notes (Signed)
CSN: [id number]     Arrival date & time 05/24/15  1007 History   First MD Initiated Contact with Patient 05/24/15 1209    Nurses notes were reviewed. Chief Complaint  Patient presents with  . Diarrhea    one week of diarrhea and noticed blood on toilet tissue. Has had nausea no vomiting. Here also for wound packing removal but none noted.    #1 patient is here to have an abscess rechecked on her left upper shoulder. She states that Wednesday a cyst was removed by Stevphen Meuse and packing placed. She states that she was unable or unwilling to get the Bactrim filled she is here to have that packing removed.   #2 diarrhea/ abdominal pain. She states for about the last 2-3 weeks she's been having diarrhea and intermittent abdominal pain. She states diarrhea is gotten worse initially the symptoms formed stools now is mainly just loose. She denies any history of Clostridium infection or recent antibiotic usage. States that all this started after she returned from a wedding in [place] the food that she ate there is a cavity the dig was a steak dish but after that event is when the abdominal pain and diarrhea started. She's never had a colonoscopy before. There is no history of colon cancer in the mother's side of family and she is  unaware of her father's side of family . She's never had a colonoscopy before. States that today he acknowledges she have diarrhea this morning but she also saw bright red blood coming from her rectum as well and that alarmed and she came in to be seen and evaluated for that. She denies any time and to eating with this abdominal pain and she is status post hysterectomy as well.     (Consider location/radiation/quality/duration/timing/severity/associated sxs/prior Treatment) Patient is a 44 y.o. female presenting with diarrhea.  Diarrhea Quality:  Bloody, watery and unusually odiferous Severity:  Moderate Duration:  2 weeks Timing:  Constant Progression:   Worsening Relieved by:  Nothing Ineffective treatments:  None tried Associated symptoms: abdominal pain and vomiting   Associated symptoms comment:  He has had abdominal pain vomiting and nausea this Abdominal pain:    Location:  Generalized   Quality:  Dull   Severity:  Moderate   Onset quality:  Sudden   Progression:  Waxing and waning Vomiting:    Quality:  Stomach contents   Severity:  Moderate   Timing:  Sporadic Risk factors: suspect food intake   Risk factors: no recent antibiotic use, no sick contacts and no travel to endemic areas     Past Medical History  Diagnosis Date  . Diabetes mellitus without complication ([place])     pre-diabetic  . Hypertension   . Bipolar 1 disorder [facility])    Past Surgical History  Procedure Laterality Date  . Abdominal hysterectomy    . Gallbladder surgery     History reviewed. No pertinent family history. Social History  Substance Use Topics  . Smoking status: Former Research scientist (life sciences)  . Smokeless tobacco: Never Used  . Alcohol Use: No   OB History    No data available     Review of Systems  Respiratory: Negative for chest tightness, shortness of breath and wheezing.   Gastrointestinal: Positive for vomiting, abdominal pain and diarrhea.  All other systems reviewed and are negative.   Allergies  Lisinopril  Home Medications   Prior to Admission medications   Medication Sig Start Date End Date Taking? Authorizing Provider  chlorpheniramine-HYDROcodone (TUSSIONEX PENNKINETIC ER) 10-8 MG/5ML SUER Take 5 mLs by mouth 2 (two) times daily. 03/01/15   [name], PA-C  doxycycline (VIBRAMYCIN) 100 MG capsule Take 1 capsule (100 mg total) by mouth 2 (two) times daily. 03/01/15   [name], PA-C  lamoTRIgine (LAMICTAL) 200 MG tablet Take 200 mg by mouth daily.    Historical Provider, MD  levothyroxine (SYNTHROID, LEVOTHROID) 112 MCG tablet Take 112 mcg by mouth daily before breakfast.    Historical Provider, MD  losartan (COZAAR) 50  MG tablet Take 50 mg by mouth daily.    Historical Provider, MD  metFORMIN (GLUCOPHAGE) 500 MG tablet Take 500 mg by mouth 2 (two) times daily with a meal.    Historical Provider, MD  mupirocin ointment (BACTROBAN) 2 % Apply 1 application topically 3 (three) times daily. 03/01/15   [name], PA-C  mupirocin ointment (BACTROBAN) 2 % Apply 1 application topically 3 (three) times daily. 05/24/15   Frederich Cha, MD  ondansetron (ZOFRAN ODT) 4 MG disintegrating tablet Take 1 tablet (4 mg total) by mouth every 8 (eight) hours as needed for nausea or vomiting. 04/08/15   Marylene Land, NP  sulfamethoxazole-trimethoprim (BACTRIM DS,SEPTRA DS) 800-160 MG tablet Take 1 tablet by mouth 2 (two) times daily. 05/21/15 05/28/15  Marylene Land, NP  ziprasidone (GEODON) 60 MG capsule Take 20 mg by mouth 2 (two) times daily with a meal.     Historical Provider, MD   Meds Ordered and Administered this Visit  Medications - No data to display  BP 144/85 mmHg  Pulse 68  Temp(Src) 97.4 F (36.3 C) (Oral)  Resp 20  Ht 5\' 6"  (1.676 m)  Wt 275 lb (124.739 kg)  BMI 44.41 kg/m2  SpO2 100% No data found.   Physical Exam  Constitutional: She is oriented to person, place, and time. She appears well-developed and well-nourished.  Obese white female  HENT:  Head: Normocephalic.  Eyes: Conjunctivae are normal. Pupils are equal, round, and reactive to light.  Neck: Neck supple.  Abdominal: Soft. She exhibits distension. She exhibits no shifting dullness, no fluid wave and no ascites. There is no hepatosplenomegaly. There is tenderness in the right upper quadrant. There is no CVA tenderness, no tenderness at McBurney's point and negative Murphy's sign.  Obese abdomen with some mild tenderness in the right upper quadrant.  Genitourinary: Rectal exam shows external hemorrhoid and tenderness. Rectal exam shows no internal hemorrhoid, no fissure, no mass and anal tone normal. Guaiac positive stool.  Musculoskeletal:  Normal range of motion.  The cyst that was removed off the left shoulder appears to be healed is some mild swelling but no significant redness no signs whatsoever is present in the wound appears to be completely closed.  Neurological: She is alert and oriented to person, place, and time.  Skin: Skin is warm and dry.  Vitals reviewed.   ED Course  Procedures (including critical care time)  Labs Review Labs Reviewed  OCCULT BLOOD X 1 CARD TO LAB, STOOL - Abnormal; Notable for the following:    Fecal Occult Bld POSITIVE (*)    All other components within normal limits  COMPREHENSIVE METABOLIC PANEL - Abnormal; Notable for the following:    Sodium 134 (*)    Chloride 98 (*)    Glucose, Bld 109 (*)    Calcium 8.8 (*)    All other components within normal limits  STOOL CULTURE  C DIFFICILE QUICK SCREEN W PCR REFLEX  CBC WITH  DIFFERENTIAL/PLATELET  AMYLASE  LIPASE, BLOOD  OVA + PARASITE EXAM    Imaging Review No results found.   Visual Acuity Review  Right Eye Distance:   Left Eye Distance:   Bilateral Distance:    Right Eye Near:   Left Eye Near:    Bilateral Near:     Results for orders placed or performed during the hospital encounter of 05/24/15  Occult blood card to lab, stool Provider will collect  Result Value Ref Range   Fecal Occult Bld POSITIVE (A) NEGATIVE  CBC with Differential  Result Value Ref Range   WBC 9.5 3.6 - 11.0 K/uL   RBC 5.04 3.80 - 5.20 MIL/uL   Hemoglobin 15.0 12.0 - 16.0 g/dL   HCT 45.2 35.0 - 47.0 %   MCV 89.7 80.0 - 100.0 fL   MCH 29.7 26.0 - 34.0 pg   MCHC 33.2 32.0 - 36.0 g/dL   RDW 14.0 11.5 - 14.5 %   Platelets 296 150 - 440 K/uL   Neutrophils Relative % 63 %   Neutro Abs 6.1 1.4 - 6.5 K/uL   Lymphocytes Relative 27 %   Lymphs Abs 2.5 1.0 - 3.6 K/uL   Monocytes Relative 7 %   Monocytes Absolute 0.7 0.2 - 0.9 K/uL   Eosinophils Relative 2 %   Eosinophils Absolute 0.1 0 - 0.7 K/uL   Basophils Relative 1 %   Basophils  Absolute 0.1 0 - 0.1 K/uL  Comprehensive metabolic panel  Result Value Ref Range   Sodium 134 (L) 135 - 145 mmol/L   Potassium 4.1 3.5 - 5.1 mmol/L   Chloride 98 (L) 101 - 111 mmol/L   CO2 27 22 - 32 mmol/L   Glucose, Bld 109 (H) 65 - 99 mg/dL   BUN 15 6 - 20 mg/dL   Creatinine, Ser 0.84 0.44 - 1.00 mg/dL   Calcium 8.8 (L) 8.9 - 10.3 mg/dL   Total Protein 7.6 6.5 - 8.1 g/dL   Albumin 4.1 3.5 - 5.0 g/dL   AST 21 15 - 41 U/L   ALT 25 14 - 54 U/L   Alkaline Phosphatase 81 38 - 126 U/L   Total Bilirubin 0.5 0.3 - 1.2 mg/dL   GFR calc non Af Amer >60 >60 mL/min   GFR calc Af Amer >60 >60 mL/min   Anion gap 9 5 - 15  Amylase  Result Value Ref Range   Amylase 51 28 - 100 U/L  Lipase, blood  Result Value Ref Range   Lipase 36 11 - 51 U/L   Results for orders placed or performed during the hospital encounter of 05/24/15  Occult blood card to lab, stool Provider will collect  Result Value Ref Range   Fecal Occult Bld POSITIVE (A) NEGATIVE  CBC with Differential  Result Value Ref Range   WBC 9.5 3.6 - 11.0 K/uL   RBC 5.04 3.80 - 5.20 MIL/uL   Hemoglobin 15.0 12.0 - 16.0 g/dL   HCT 45.2 35.0 - 47.0 %   MCV 89.7 80.0 - 100.0 fL   MCH 29.7 26.0 - 34.0 pg   MCHC 33.2 32.0 - 36.0 g/dL   RDW 14.0 11.5 - 14.5 %   Platelets 296 150 - 440 K/uL   Neutrophils Relative % 63 %   Neutro Abs 6.1 1.4 - 6.5 K/uL   Lymphocytes Relative 27 %   Lymphs Abs 2.5 1.0 - 3.6 K/uL   Monocytes Relative 7 %   Monocytes Absolute 0.7 0.2 -  0.9 K/uL   Eosinophils Relative 2 %   Eosinophils Absolute 0.1 0 - 0.7 K/uL   Basophils Relative 1 %   Basophils Absolute 0.1 0 - 0.1 K/uL  Comprehensive metabolic panel  Result Value Ref Range   Sodium 134 (L) 135 - 145 mmol/L   Potassium 4.1 3.5 - 5.1 mmol/L   Chloride 98 (L) 101 - 111 mmol/L   CO2 27 22 - 32 mmol/L   Glucose, Bld 109 (H) 65 - 99 mg/dL   BUN 15 6 - 20 mg/dL   Creatinine, Ser 0.84 0.44 - 1.00 mg/dL   Calcium 8.8 (L) 8.9 - 10.3 mg/dL   Total  Protein 7.6 6.5 - 8.1 g/dL   Albumin 4.1 3.5 - 5.0 g/dL   AST 21 15 - 41 U/L   ALT 25 14 - 54 U/L   Alkaline Phosphatase 81 38 - 126 U/L   Total Bilirubin 0.5 0.3 - 1.2 mg/dL   GFR calc non Af Amer >60 >60 mL/min   GFR calc Af Amer >60 >60 mL/min   Anion gap 9 5 - 15  Amylase  Result Value Ref Range   Amylase 51 28 - 100 U/L  Lipase, blood  Result Value Ref Range   Lipase 36 11 - 51 U/L    MDM   1. Encounter for recheck of abscess following incision and drainage   2. Abdominal pain, generalized   3. Diarrhea, unspecified type   4. Rectal bleeding    This time will get stool for ova and parasites and C&S and C. difficile. Do recommend she follow-up with her PCP and get scheduled for colonoscopy because of rectal bleeding. Irregardless of what the stool specimens show. Will send obstruction for Zofran for nausea in case he gets worse. For the abscess recheck recommending she doesn't get the Septra prescription filled this time. She continued get Bactroban ointment which I'll send in as a prescription and use that keep the wound from getting infected. Work note written for today and tomorrow as well.``    Frederich Cha, MD 05/24/15 [phone]

## 2015-05-24 NOTE — ED Notes (Signed)
Patient sent home with stool kit with instructions and will return once collected.

## 2015-05-24 NOTE — ED Notes (Signed)
Pt sent home with stool kit and instructions and will return once collected

## 2015-05-24 NOTE — Discharge Instructions (Signed)
Abdominal Pain, Adult Many things can cause belly (abdominal) pain. Most times, the belly pain is not dangerous. Many cases of belly pain can be watched and treated at home. HOME CARE   Do not take medicines that help you go poop (laxatives) unless told to by your doctor.  Only take medicine as told by your doctor.  Eat or drink as told by your doctor. Your doctor will tell you if you should be on a special diet. GET HELP IF:  You do not know what is causing your belly pain.  You have belly pain while you are sick to your stomach (nauseous) or have runny poop (diarrhea).  You have pain while you pee or poop.  Your belly pain wakes you up at night.  You have belly pain that gets worse or better when you eat.  You have belly pain that gets worse when you eat fatty foods.  You have a fever. GET HELP RIGHT AWAY IF:   The pain does not go away within 2 hours.  You keep throwing up (vomiting).  The pain changes and is only in the right or left part of the belly.  You have bloody or tarry looking poop. MAKE SURE YOU:   Understand these instructions.  Will watch your condition.  Will get help right away if you are not doing well or get worse.   This information is not intended to replace advice given to you by your health care provider. Make sure you discuss any questions you have with your health care provider.   Document Released: 12/15/2007 Document Revised: 07/19/2014 Document Reviewed: 03/07/2013 Elsevier Interactive Patient Education 2016 [employer]  Bloody Diarrhea Bloody diarrhea can be caused by many different conditions. Most of the time bloody diarrhea is the result of food poisoning or minor infections. Bloody diarrhea usually improves over 2 to 3 days of rest and fluid replacement. Other conditions that can cause bloody diarrhea include:  Internal bleeding.  Infection.  Diseases of the bowel and colon. Internal bleeding from an ulcer or bowel disease can  be severe and requires hospital care or even surgery. DIAGNOSIS  To find out what is wrong your caregiver may check your:  Stool.  Blood.  Results from a test that looks inside the body (endoscopy). TREATMENT   Get plenty of rest.  Drink enough water and fluids to keep your urine clear or pale yellow.  Do not smoke.  Solid foods and dairy products should be avoided until your illness improves.  As you improve, slowly return to a regular diet with easily-digested foods first. Examples are:  Bananas.  Rice.  Toast.  Crackers. You should only need these for about 2 days before adding more normal foods to your diet.  Avoid spicy or fatty foods as well as caffeine and alcohol for several days.  Medicine to control cramping and diarrhea can relieve symptoms but may prolong some cases of bloody diarrhea. Antibiotics can speed recovery from diarrhea due to some bacterial infections. Call your caregiver if diarrhea does not get better in 3 days. SEEK MEDICAL CARE IF:   You do not improve after 3 days.  Your diarrhea improves but your stool appears black. SEEK IMMEDIATE MEDICAL CARE IF:   You become extremely weak or faint.  You become very sweaty.  You have increased pain or bleeding.  You develop repeated vomiting.  You vomit and you see blood or the vomit looks black in color.  You have a fever.  This information is not intended to replace advice given to you by your health care provider. Make sure you discuss any questions you have with your health care provider.   Document Released: 06/28/2005 Document Revised: 07/19/2014 Document Reviewed: 05/30/2009 Elsevier Interactive Patient Education 2016 [place].  Diarrhea Diarrhea is watery poop (stool). It can make you feel weak, tired, thirsty, or give you a dry mouth (signs of dehydration). Watery poop is a sign of another problem, most often an infection. It often lasts 2-3 days. It can last longer if it is a  sign of something serious. Take care of yourself as told by your doctor. HOME CARE   Drink 1 cup (8 ounces) of fluid each time you have watery poop.  Do not drink the following fluids:  Those that contain simple sugars (fructose, glucose, galactose, lactose, sucrose, maltose).  Sports drinks.  Fruit juices.  Whole milk products.  Sodas.  Drinks with caffeine (coffee, tea, soda) or alcohol.  Oral rehydration solution may be used if the doctor says it is okay. You may make your own solution. Follow this recipe:   - teaspoon table salt.   teaspoon baking soda.   teaspoon salt substitute containing potassium chloride.  1 tablespoons sugar.  1 liter (34 ounces) of water.  Avoid the following foods:  High fiber foods, such as raw fruits and vegetables.  Nuts, seeds, and whole grain breads and cereals.   Those that are sweetened with sugar alcohols (xylitol, sorbitol, mannitol).  Try eating the following foods:  Starchy foods, such as rice, toast, pasta, low-sugar cereal, oatmeal, baked potatoes, crackers, and bagels.  Bananas.  Applesauce.  Eat probiotic-rich foods, such as yogurt and milk products that are fermented.  Wash your hands well after each time you have watery poop.  Only take medicine as told by your doctor.  Take a warm bath to help lessen burning or pain from having watery poop. GET HELP RIGHT AWAY IF:   You cannot drink fluids without throwing up (vomiting).  You keep throwing up.  You have blood in your poop, or your poop looks black and tarry.  You do not pee (urinate) in 6-8 hours, or there is only a small amount of very dark pee.  You have belly (abdominal) pain that gets worse or stays in the same spot (localizes).  You are weak, dizzy, confused, or light-headed.  You have a very bad headache.  Your watery poop gets worse or does not get better.  You have a fever or lasting symptoms for more than 2-3 days.  You have a fever and  your symptoms suddenly get worse. MAKE SURE YOU:   Understand these instructions.  Will watch your condition.  Will get help right away if you are not doing well or get worse.   This information is not intended to replace advice given to you by your health care provider. Make sure you discuss any questions you have with your health care provider.   Document Released: 12/15/2007 Document Revised: 07/19/2014 Document Reviewed: 03/05/2012 Elsevier Interactive Patient Education [employer].

## 2015-05-26 ENCOUNTER — Telehealth: Payer: Self-pay

## 2015-05-26 NOTE — ED Notes (Signed)
Patient called for Stool results. Informed that all negative so far. Patient states increased abdominal cramping and increased diarrhea. Informed may need to go to ER for evaluation. Follow up with PMD and Dr. Karolee Ohs office contacted and Myles Lipps RN given patient information who will give to Ginger to call with an appointment time/date

## 2015-05-27 LAB — OVA + PARASITE EXAM

## 2015-05-27 LAB — STOOL CULTURE: SPECIAL REQUESTS: NORMAL

## 2015-05-27 LAB — O&P RESULT

## 2015-06-11 ENCOUNTER — Other Ambulatory Visit: Payer: Self-pay

## 2015-06-11 DIAGNOSIS — F319 Bipolar disorder, unspecified: Secondary | ICD-10-CM | POA: Insufficient documentation

## 2015-06-12 ENCOUNTER — Ambulatory Visit (INDEPENDENT_AMBULATORY_CARE_PROVIDER_SITE_OTHER): Payer: 59 | Admitting: Gastroenterology

## 2015-06-12 ENCOUNTER — Other Ambulatory Visit: Payer: Self-pay

## 2015-06-12 ENCOUNTER — Encounter: Payer: Self-pay | Admitting: Gastroenterology

## 2015-06-12 VITALS — BP 127/87 | HR 80 | Temp 98.8°F | Ht 66.0 in | Wt 280.0 lb

## 2015-06-12 DIAGNOSIS — K6289 Other specified diseases of anus and rectum: Secondary | ICD-10-CM | POA: Diagnosis not present

## 2015-06-12 DIAGNOSIS — K921 Melena: Secondary | ICD-10-CM

## 2015-06-12 NOTE — Progress Notes (Signed)
Gastroenterology Consultation  Referring Provider:     Sharyne Peach, MD Primary Care Physician:  Sharyne Peach, MD Primary Gastroenterologist:  Dr. [name]     Reason for Consultation:     Alternating diarrhea and constipation with rectal bleeding and rectal pain        HPI:   Lori Knight is a 44 y.o. y/o female referred for consultation & management of rectal bleeding and rectal pain by Dr. Iona Beard, Rubbie Battiest, MD.  This patient is a pleasant woman who comes in today with a report of alternating diarrhea and constipation for many years. She states it had been worse when she would have her menstrual cycle but now has had her uterus removed but still has her ovaries. The patient reports that she has had episodes of severe rectal pain that have brought her to her knees that it was so painful. She states it was worse than labor pains. There is no report of any unexplained weight loss but she does report having some rectal bleeding at the same time his rectal pain. There is no family history of colon cancer colon polyps. The patient denies any abdominal pain with the rectal pain. The patient was seen in urgent care and her bleeding was not thought to be from hemorrhoids and she was recommended to see a gastrologist. Patient tells me she has a history of bipolar disorder  Past Medical History  Diagnosis Date  . Diabetes mellitus without complication ([place])     pre-diabetic  . Hypertension   . Bipolar 1 disorder ([place])   . BP (high blood pressure) 02/14/2014  . Biliary calculus with cholecystitis 08/14/2012  . Hyperprolactinemia ([place]) 06/21/2012  . Adult hypothyroidism 06/21/2012  . Bilateral polycystic ovarian syndrome 06/21/2012  . Diabetes mellitus ([place]) 08/14/2012    Past Surgical History  Procedure Laterality Date  . Abdominal hysterectomy    . Gallbladder surgery      Prior to Admission medications   Medication Sig Start Date End Date Taking? Authorizing Provider  lamoTRIgine  (LAMICTAL) 200 MG tablet Take 200 mg by mouth daily.   Yes Historical Provider, MD  lansoprazole (PREVACID) 30 MG capsule  05/04/15  Yes Historical Provider, MD  levothyroxine (SYNTHROID, LEVOTHROID) 112 MCG tablet Take 112 mcg by mouth daily before breakfast.   Yes Historical Provider, MD  losartan (COZAAR) 50 MG tablet Take 50 mg by mouth daily.   Yes Historical Provider, MD  magnesium oxide (MAG-OX) 400 MG tablet Take by mouth. 12/10/14 12/10/15 Yes Historical Provider, MD  Melatonin 5 MG CAPS Take 5 mg by mouth Nightly.   Yes Historical Provider, MD  metFORMIN (GLUCOPHAGE) 500 MG tablet Take 500 mg by mouth 2 (two) times daily with a meal.   Yes Historical Provider, MD  Multiple Vitamin (MULTIVITAMIN) tablet Take 1 tablet by mouth daily.   Yes Historical Provider, MD  nadolol (CORGARD) 20 MG tablet  05/04/15  Yes Historical Provider, MD  ondansetron (ZOFRAN ODT) 8 MG disintegrating tablet Take 1 tablet (8 mg total) by mouth every 8 (eight) hours as needed for nausea or vomiting. 05/24/15  Yes Frederich Cha, MD  chlorpheniramine-HYDROcodone [facility] PENNKINETIC ER) 10-8 MG/5ML SUER Take 5 mLs by mouth 2 (two) times daily. Patient not taking: Reported on 06/12/2015 03/01/15   [name], PA-C  doxycycline (VIBRAMYCIN) 100 MG capsule Take 1 capsule (100 mg total) by mouth 2 (two) times daily. Patient not taking: Reported on 06/12/2015 03/01/15   [name],  PA-C  lamoTRIgine (LAMICTAL) 100 MG tablet  05/03/15   Historical Provider, MD  mupirocin ointment (BACTROBAN) 2 % Apply 1 application topically 3 (three) times daily. Patient not taking: Reported on 06/12/2015 03/01/15   [name], PA-C  mupirocin ointment (BACTROBAN) 2 % Apply 1 application topically 3 (three) times daily. Patient not taking: Reported on 06/12/2015 05/24/15   Frederich Cha, MD  ondansetron (ZOFRAN ODT) 4 MG disintegrating tablet Take 1 tablet (4 mg total) by mouth every 8 (eight) hours as needed for nausea or  vomiting. Patient not taking: Reported on 06/12/2015 04/08/15   Marylene Land, NP  ziprasidone (GEODON) 60 MG capsule Take 20 mg by mouth 2 (two) times daily with a meal.     Historical Provider, MD    Family History  Problem Relation Age of Onset  . Diabetes Maternal Grandmother   . Hypertension Maternal Grandmother   . Hyperlipidemia Maternal Grandmother   . Diabetes Mother   . Hypertension Mother   . Hypothyroidism Maternal Grandmother   . Hypothyroidism Mother   . Bipolar disorder Mother      Social History  Substance Use Topics  . Smoking status: Former Research scientist (life sciences)  . Smokeless tobacco: Never Used  . Alcohol Use: No    Allergies as of 06/12/2015 - Review Complete 06/12/2015  Allergen Reaction Noted  . Lisinopril Cough 03/01/2015    Review of Systems:    All systems reviewed and negative except where noted in HPI.   Physical Exam:  BP 127/87 mmHg  Pulse 80  Temp(Src) 98.8 F (37.1 C) (Oral)  Ht 5\' 6"  (1.676 m)  Wt 280 lb (127.007 kg)  BMI 45.21 kg/m2 No LMP recorded. Patient has had a hysterectomy. Psych:  Alert and cooperative. Normal mood and affect. General:   Alert,  Well-developed, obese, well-nourished, pleasant and cooperative in NAD Head:  Normocephalic and atraumatic. Eyes:  Sclera clear, no icterus.   Conjunctiva pink. Ears:  Normal auditory acuity. Nose:  No deformity, discharge, or lesions. Mouth:  No deformity or lesions,oropharynx pink & moist. Neck:  Supple; no masses or thyromegaly. Lungs:  Respirations even and unlabored.  Clear throughout to auscultation.   No wheezes, crackles, or rhonchi. No acute distress. Heart:  Regular rate and rhythm; no murmurs, clicks, rubs, or gallops. Abdomen:  Normal bowel sounds.  No bruits.  Soft, non-tender and non-distended without masses, hepatosplenomegaly or hernias noted.  No guarding or rebound tenderness.  Negative Carnett sign.   Rectal:  Deferred.  Msk:  Symmetrical without gross deformities.  Good, equal  movement & strength bilaterally. Pulses:  Normal pulses noted. Extremities:  No clubbing or edema.  No cyanosis. Neurologic:  Alert and oriented x3;  grossly normal neurologically. Skin:  Intact without significant lesions or rashes.  No jaundice. Lymph Nodes:  No significant cervical adenopathy. Psych:  Alert and cooperative. Normal mood and affect.  Imaging Studies: No results found.  Assessment and Plan:   Lori Knight is a 44 y.o. y/o female comes in today for severe rectal pain with rectal bleeding. The patient is likely having rectal spasms. The patient has been given information on rectal physical therapy. Due to the rectal pain in the rectal bleeding the patient will be set up for colonoscopy. The patient has been explained the plan and agrees with it.I have discussed risks & benefits which include, but are not limited to, bleeding, infection, perforation & drug reaction.  The patient agrees with this plan & written consent will be obtained.  Note: This dictation was prepared with Dragon dictation along with smaller phrase technology. Any transcriptional errors that result from this process are unintentional.

## 2015-06-17 ENCOUNTER — Encounter: Payer: Self-pay | Admitting: *Deleted

## 2015-06-18 NOTE — Discharge Instructions (Signed)

## 2015-06-19 ENCOUNTER — Ambulatory Visit
Admission: RE | Admit: 2015-06-19 | Discharge: 2015-06-19 | Disposition: A | Discharge status: Home / Self Care | Payer: 59 | Source: Ambulatory Visit | Attending: Gastroenterology | Admitting: Gastroenterology

## 2015-06-19 ENCOUNTER — Ambulatory Visit: Payer: 59 | Admitting: Anesthesiology

## 2015-06-19 ENCOUNTER — Encounter
Admission: RE | Disposition: A | Discharge status: Home / Self Care | Payer: Self-pay | Source: Ambulatory Visit | Attending: Gastroenterology

## 2015-06-19 DIAGNOSIS — I10 Essential (primary) hypertension: Secondary | ICD-10-CM | POA: Insufficient documentation

## 2015-06-19 DIAGNOSIS — Z90710 Acquired absence of both cervix and uterus: Secondary | ICD-10-CM | POA: Insufficient documentation

## 2015-06-19 DIAGNOSIS — K602 Anal fissure, unspecified: Secondary | ICD-10-CM | POA: Diagnosis not present

## 2015-06-19 DIAGNOSIS — R011 Cardiac murmur, unspecified: Secondary | ICD-10-CM | POA: Diagnosis not present

## 2015-06-19 DIAGNOSIS — Z9889 Other specified postprocedural states: Secondary | ICD-10-CM | POA: Insufficient documentation

## 2015-06-19 DIAGNOSIS — Z7984 Long term (current) use of oral hypoglycemic drugs: Secondary | ICD-10-CM | POA: Diagnosis not present

## 2015-06-19 DIAGNOSIS — G4730 Sleep apnea, unspecified: Secondary | ICD-10-CM | POA: Diagnosis not present

## 2015-06-19 DIAGNOSIS — E282 Polycystic ovarian syndrome: Secondary | ICD-10-CM | POA: Diagnosis not present

## 2015-06-19 DIAGNOSIS — K641 Second degree hemorrhoids: Secondary | ICD-10-CM | POA: Diagnosis not present

## 2015-06-19 DIAGNOSIS — Z818 Family history of other mental and behavioral disorders: Secondary | ICD-10-CM | POA: Insufficient documentation

## 2015-06-19 DIAGNOSIS — K921 Melena: Secondary | ICD-10-CM | POA: Diagnosis not present

## 2015-06-19 DIAGNOSIS — E119 Type 2 diabetes mellitus without complications: Secondary | ICD-10-CM | POA: Diagnosis not present

## 2015-06-19 DIAGNOSIS — E039 Hypothyroidism, unspecified: Secondary | ICD-10-CM | POA: Diagnosis not present

## 2015-06-19 DIAGNOSIS — Z8249 Family history of ischemic heart disease and other diseases of the circulatory system: Secondary | ICD-10-CM | POA: Diagnosis not present

## 2015-06-19 DIAGNOSIS — Z833 Family history of diabetes mellitus: Secondary | ICD-10-CM | POA: Insufficient documentation

## 2015-06-19 DIAGNOSIS — Z888 Allergy status to other drugs, medicaments and biological substances status: Secondary | ICD-10-CM | POA: Insufficient documentation

## 2015-06-19 DIAGNOSIS — Z87891 Personal history of nicotine dependence: Secondary | ICD-10-CM | POA: Diagnosis not present

## 2015-06-19 DIAGNOSIS — Z79899 Other long term (current) drug therapy: Secondary | ICD-10-CM | POA: Insufficient documentation

## 2015-06-19 DIAGNOSIS — K6289 Other specified diseases of anus and rectum: Secondary | ICD-10-CM | POA: Diagnosis not present

## 2015-06-19 DIAGNOSIS — F319 Bipolar disorder, unspecified: Secondary | ICD-10-CM | POA: Diagnosis not present

## 2015-06-19 HISTORY — DX: Cardiac arrhythmia, unspecified: I49.9

## 2015-06-19 HISTORY — DX: Cardiac murmur, unspecified: R01.1

## 2015-06-19 HISTORY — DX: Reserved for inherently not codable concepts without codable children: IMO0001

## 2015-06-19 HISTORY — DX: Nausea with vomiting, unspecified: R11.2

## 2015-06-19 HISTORY — PX: COLONOSCOPY WITH PROPOFOL: SHX5780

## 2015-06-19 HISTORY — DX: Sleep apnea, unspecified: G47.30

## 2015-06-19 HISTORY — DX: Dizziness and giddiness: R42

## 2015-06-19 HISTORY — DX: Motion sickness, initial encounter: T75.3XXA

## 2015-06-19 HISTORY — DX: Nausea with vomiting, unspecified: Z98.890

## 2015-06-19 LAB — GLUCOSE, CAPILLARY: Glucose-Capillary: 111 mg/dL — ABNORMAL HIGH (ref 65–99)

## 2015-06-19 SURGERY — COLONOSCOPY WITH PROPOFOL
Anesthesia: Monitor Anesthesia Care | Wound class: Contaminated

## 2015-06-19 MED ORDER — ACETAMINOPHEN 160 MG/5ML PO SOLN: 325.0000 mg | ORAL | Status: DC | PRN

## 2015-06-19 MED ORDER — PROPOFOL 10 MG/ML IV BOLUS
INTRAVENOUS | Status: DC | PRN
  Administered 2015-06-19: 40 mg via INTRAVENOUS
  Administered 2015-06-19: 30 mg via INTRAVENOUS
  Administered 2015-06-19: 50 mg via INTRAVENOUS
  Administered 2015-06-19: 70 mg via INTRAVENOUS
  Administered 2015-06-19: 50 mg via INTRAVENOUS
  Administered 2015-06-19: 40 mg via INTRAVENOUS
  Administered 2015-06-19: 50 mg via INTRAVENOUS

## 2015-06-19 MED ORDER — ACETAMINOPHEN 325 MG PO TABS: 325.0000 mg | ORAL_TABLET | ORAL | Status: DC | PRN

## 2015-06-19 MED ORDER — SIMETHICONE 40 MG/0.6ML PO SUSP
ORAL | Status: DC | PRN
  Administered 2015-06-19: 08:00:00

## 2015-06-19 MED ORDER — LACTATED RINGERS IV SOLN
INTRAVENOUS | Status: DC
  Administered 2015-06-19 (×2): via INTRAVENOUS

## 2015-06-19 MED ORDER — LIDOCAINE HCL (CARDIAC) 20 MG/ML IV SOLN
INTRAVENOUS | Status: DC | PRN
  Administered 2015-06-19: 20 mg via INTRAVENOUS

## 2015-06-19 SURGICAL SUPPLY — 28 items
CANISTER SUCT 1200ML W/VALVE (MISCELLANEOUS) ×2 IMPLANT
FCP ESCP3.2XJMB 240X2.8X (MISCELLANEOUS)
FORCEPS BIOP RAD 4 LRG CAP 4 (CUTTING FORCEPS) IMPLANT
FORCEPS BIOP RJ4 240 W/NDL (MISCELLANEOUS)
FORCEPS ESCP3.2XJMB 240X2.8X (MISCELLANEOUS) IMPLANT
GOWN CVR UNV OPN BCK APRN NK (MISCELLANEOUS) ×2 IMPLANT
GOWN ISOL THUMB LOOP REG UNIV (MISCELLANEOUS) ×2
HEMOCLIP INSTINCT (CLIP) IMPLANT
INJECTOR VARIJECT VIN23 (MISCELLANEOUS) IMPLANT
KIT CO2 TUBING (TUBING) IMPLANT
KIT DEFENDO VALVE AND CONN (KITS) IMPLANT
KIT ENDO PROCEDURE OLY (KITS) ×2 IMPLANT
LIGATOR MULTIBAND 6SHOOTER MBL (MISCELLANEOUS) IMPLANT
MARKER SPOT ENDO TATTOO 5ML (MISCELLANEOUS) IMPLANT
PAD GROUND ADULT SPLIT (MISCELLANEOUS) IMPLANT
SNARE SHORT THROW 13M SML OVAL (MISCELLANEOUS) IMPLANT
SNARE SHORT THROW 30M LRG OVAL (MISCELLANEOUS) IMPLANT
SPOT EX ENDOSCOPIC TATTOO (MISCELLANEOUS)
SUCTION POLY TRAP 4CHAMBER (MISCELLANEOUS) IMPLANT
TRAP SUCTION POLY (MISCELLANEOUS) IMPLANT
TUBING CONN 6MMX3.1M (TUBING)
TUBING SUCTION CONN 0.25 STRL (TUBING) IMPLANT
UNDERPAD 30X60 958B10 (PK) (MISCELLANEOUS) IMPLANT
VALVE BIOPSY ENDO (VALVE) IMPLANT
VARIJECT INJECTOR VIN23 (MISCELLANEOUS)
WATER AUXILLARY (MISCELLANEOUS) IMPLANT
WATER STERILE IRR 250ML POUR (IV SOLUTION) ×2 IMPLANT
WATER STERILE IRR 500ML POUR (IV SOLUTION) IMPLANT

## 2015-06-19 NOTE — Anesthesia Procedure Notes (Signed)
Procedure Name: MAC Performed by: Shaunika Italiano Pre-anesthesia Checklist: Patient identified, Emergency Drugs available, Suction available, Patient being monitored and Timeout performed Patient Re-evaluated:Patient Re-evaluated prior to inductionOxygen Delivery Method: Nasal cannula       

## 2015-06-19 NOTE — Transfer of Care (Signed)
Immediate Anesthesia Transfer of Care Note  Patient: Lori Knight  Procedure(s) Performed: Procedure(s) with comments: COLONOSCOPY WITH PROPOFOL (N/A) - Diabetic - oral meds CPAP  Patient Location: PACU  Anesthesia Type: MAC  Level of Consciousness: awake, alert  and patient cooperative  Airway and Oxygen Therapy: Patient Spontanous Breathing and Patient connected to supplemental oxygen  Post-op Assessment: Post-op Vital signs reviewed, Patient's Cardiovascular Status Stable, Respiratory Function Stable, Patent Airway and No signs of Nausea or vomiting  Post-op Vital Signs: Reviewed and stable  Complications: No apparent anesthesia complications

## 2015-06-19 NOTE — Op Note (Signed)
[facility]astroenter[facility] Patient Name: Lori Knight Procedure Date: 06/19/2015 7:54 AM MRN: [id number] Account #: [id number] Date of Birth: September 22, 1970 Admit Type: Outpatient Age: 44 Room: [facility] OR ROOM 01 Gender: Female Note Status: Finalized Procedure:         Colonoscopy Indications:       Hematochezia, Rectal pain Providers:         [name], MD Referring MD:      Rubbie Battiest. Iona Beard, MD (Referring MD) Medicines:         Propofol per Anesthesia Complications:     No immediate complications. Procedure:         Pre-Anesthesia Assessment:                    - Prior to the procedure, a History and Physical was                     performed, and patient medications and allergies were                     reviewed. The patient's tolerance of previous anesthesia                     was also reviewed. The risks and benefits of the procedure                     and the sedation options and risks were discussed with the                     patient. All questions were answered, and informed consent                     was obtained. Prior Anticoagulants: The patient has taken                     no previous anticoagulant or antiplatelet agents. ASA                     Grade Assessment: II - A patient with mild systemic                     disease. After reviewing the risks and benefits, the                     patient was deemed in satisfactory condition to undergo                     the procedure.                    After obtaining informed consent, the colonoscope was                     passed under direct vision. Throughout the procedure, the                     patient's blood pressure, pulse, and oxygen saturations                     were monitored continuously. The Olympus CF H180AL                     colonoscope (S#: [id number]) was introduced through the anus  and advanced to the the cecum, identified by appendiceal   orifice and ileocecal valve. The colonoscopy was performed                     without difficulty. The patient tolerated the procedure                     well. The quality of the bowel preparation was excellent. Findings:      Non-bleeding internal hemorrhoids were found during retroflexion. The       hemorrhoids were Grade II (internal hemorrhoids that prolapse but reduce       spontaneously).      The digital rectal exam findings include anal fissure. Impression:        - Non-bleeding internal hemorrhoids.                    - Anal fissure found on digital rectal exam.                    - No specimens collected. Recommendation:    - High fiber diet daily. Procedure Code(s): --- Professional ---                    [phone], Colonoscopy, flexible; diagnostic, including                     collection of specimen(s) by brushing or washing, when                     performed (separate procedure) Diagnosis Code(s): --- Professional ---                    K92.1, Melena                    K62.89, Other specified diseases of anus and rectum                    K60.2, Anal fissure, unspecified                    K64.1, Second degree hemorrhoids CPT copyright 2014 American Medical Association. All rights reserved. The codes documented in this report are preliminary and upon coder review may  be revised to meet current compliance requirements. [name], MD 06/19/2015 8:10:12 AM This report has been signed electronically. Number of Addenda: 0 Note Initiated On: 06/19/2015 7:54 AM Scope Withdrawal Time: 0 hours 6 minutes 15 seconds  Total Procedure Duration: 0 hours 8 minutes 46 seconds       [facility]

## 2015-06-19 NOTE — Anesthesia Preprocedure Evaluation (Addendum)
Anesthesia Evaluation  Patient identified by MRN, date of birth, ID band  Reviewed: Allergy & Precautions, H&P , NPO status , Patient's Chart, lab work & pertinent test results  Airway Mallampati: II  TM Distance: >3 FB Neck ROM: full    Dental no notable dental hx.    Pulmonary shortness of breath, sleep apnea , former smoker,    Pulmonary exam normal        Cardiovascular hypertension,  Rhythm:regular Rate:Normal     Neuro/Psych PSYCHIATRIC DISORDERS    GI/Hepatic   Endo/Other  diabetesHypothyroidism   Renal/GU      Musculoskeletal   Abdominal   Peds  Hematology   Anesthesia Other Findings   Reproductive/Obstetrics                            Anesthesia Physical Anesthesia Plan  ASA: III  Anesthesia Plan: MAC   Post-op Pain Management:    Induction:   Airway Management Planned:   Additional Equipment:   Intra-op Plan:   Post-operative Plan:   Informed Consent: I have reviewed the patients History and Physical, chart, labs and discussed the procedure including the risks, benefits and alternatives for the proposed anesthesia with the patient or authorized representative who has indicated his/her understanding and acceptance.     Plan Discussed with: CRNA  Anesthesia Plan Comments:        Anesthesia Quick Evaluation

## 2015-06-19 NOTE — Anesthesia Postprocedure Evaluation (Signed)
Anesthesia Post Note  Patient: Lori Knight  Procedure(s) Performed: Procedure(s) (LRB): COLONOSCOPY WITH PROPOFOL (N/A)  Patient location during evaluation: PACU Anesthesia Type: MAC Level of consciousness: awake and alert and oriented Pain management: satisfactory to patient Vital Signs Assessment: post-procedure vital signs reviewed and stable Respiratory status: spontaneous breathing, nonlabored ventilation and respiratory function stable Cardiovascular status: blood pressure returned to baseline and stable Postop Assessment: Adequate PO intake and No signs of nausea or vomiting Anesthetic complications: no    Raliegh Ip

## 2015-06-19 NOTE — H&P (Signed)
[facility]  [address], [place], [place] [address] Phone: [phone] Fax : [phone]  Primary Care Physician:  Sharyne Peach, MD Primary Gastroenterologist:  Dr. [name]  Pre-Procedure History & Physical: HPI:  Lori Knight is a 44 y.o. female is here for an colonoscopy.   Past Medical History  Diagnosis Date  . Diabetes mellitus without complication ([place])     pre-diabetic  . Hypertension   . Bipolar 1 disorder ([place])   . BP (high blood pressure) 02/14/2014  . Biliary calculus with cholecystitis 08/14/2012  . Hyperprolactinemia ([place]) 06/21/2012  . Adult hypothyroidism 06/21/2012  . Bilateral polycystic ovarian syndrome 06/21/2012  . Diabetes mellitus ([place]) 08/14/2012  . PONV (postoperative nausea and vomiting)   . Heart murmur     followed by PCP  . Dysrhythmia     wore heart monitor 2016. Corrected by changing Levothyroxine dose.  . Sleep apnea     has CPAP. has not used since 2011  . Shortness of breath dyspnea     stairs. related to wt.  . Vertigo     2x in last yr  . Motion sickness     carnival rides    Past Surgical History  Procedure Laterality Date  . Abdominal hysterectomy    . Gallbladder surgery    . Dental surgery      Prior to Admission medications   Medication Sig Start Date End Date Taking? Authorizing Provider  lamoTRIgine (LAMICTAL) 200 MG tablet Take 200 mg by mouth daily.   Yes Historical Provider, MD  lansoprazole (PREVACID) 30 MG capsule  05/04/15  Yes Historical Provider, MD  levothyroxine (SYNTHROID, LEVOTHROID) 112 MCG tablet Take 112 mcg by mouth daily before breakfast. Currently takes 1/2 tab AM and 1/2 tab PM   Yes Historical Provider, MD  losartan (COZAAR) 50 MG tablet Take 50 mg by mouth daily.   Yes Historical Provider, MD  magnesium oxide (MAG-OX) 400 MG tablet Take by mouth. 12/10/14 12/10/15 Yes Historical Provider, MD  Melatonin 5 MG CAPS Take 5 mg by mouth Nightly.   Yes Historical Provider, MD    metFORMIN (GLUCOPHAGE) 500 MG tablet Take 500 mg by mouth 2 (two) times daily with a meal.   Yes Historical Provider, MD  Multiple Vitamin (MULTIVITAMIN) tablet Take 1 tablet by mouth daily.   Yes Historical Provider, MD  nadolol (CORGARD) 20 MG tablet  05/04/15  Yes Historical Provider, MD  ondansetron (ZOFRAN ODT) 8 MG disintegrating tablet Take 1 tablet (8 mg total) by mouth every 8 (eight) hours as needed for nausea or vomiting. 05/24/15  Yes Frederich Cha, MD  chlorpheniramine-HYDROcodone [facility] PENNKINETIC ER) 10-8 MG/5ML SUER Take 5 mLs by mouth 2 (two) times daily. Patient not taking: Reported on 06/12/2015 03/01/15   [name], PA-C  doxycycline (VIBRAMYCIN) 100 MG capsule Take 1 capsule (100 mg total) by mouth 2 (two) times daily. Patient not taking: Reported on 06/12/2015 03/01/15   [name], PA-C  lamoTRIgine (LAMICTAL) 100 MG tablet  05/03/15   Historical Provider, MD  mupirocin ointment (BACTROBAN) 2 % Apply 1 application topically 3 (three) times daily. Patient not taking: Reported on 06/12/2015 03/01/15   [name], PA-C  mupirocin ointment (BACTROBAN) 2 % Apply 1 application topically 3 (three) times daily. Patient not taking: Reported on 06/12/2015 05/24/15   Frederich Cha, MD  ondansetron (ZOFRAN ODT) 4 MG disintegrating tablet Take 1 tablet (4 mg total) by mouth every 8 (eight) hours as needed for  nausea or vomiting. Patient not taking: Reported on 06/12/2015 04/08/15   Marylene Land, NP  ziprasidone (GEODON) 60 MG capsule Take 20 mg by mouth 2 (two) times daily with a meal.     Historical Provider, MD    Allergies as of 06/12/2015 - Review Complete 06/12/2015  Allergen Reaction Noted  . Lisinopril Cough 03/01/2015    Family History  Problem Relation Age of Onset  . Diabetes Maternal Grandmother   . Hypertension Maternal Grandmother   . Hyperlipidemia Maternal Grandmother   . Diabetes Mother   . Hypertension Mother   . Hypothyroidism Maternal  Grandmother   . Hypothyroidism Mother   . Bipolar disorder Mother     Social History   Social History  . Marital Status: Single    Spouse Name: N/A  . Number of Children: N/A  . Years of Education: N/A   Occupational History  . Not on file.   Social History Main Topics  . Smoking status: Former Research scientist (life sciences)  . Smokeless tobacco: Never Used     Comment: quit 2004  . Alcohol Use: No  . Drug Use: No  . Sexual Activity: Not on file   Other Topics Concern  . Not on file   Social History Narrative    Review of Systems: See HPI, otherwise negative ROS  Physical Exam: BP 124/83 mmHg  Pulse 74  Temp(Src) 98.2 F (36.8 C) (Tympanic)  Resp 16  Ht 5\' 6"  (1.676 m)  Wt 277 lb (125.646 kg)  BMI 44.73 kg/m2 General:   Alert,  pleasant and cooperative in NAD Head:  Normocephalic and atraumatic. Neck:  Supple; no masses or thyromegaly. Lungs:  Clear throughout to auscultation.    Heart:  Regular rate and rhythm. Abdomen:  Soft, nontender and nondistended. Normal bowel sounds, without guarding, and without rebound.   Neurologic:  Alert and  oriented x4;  grossly normal neurologically.  Impression/Plan: Lori Knight is here for an colonoscopy to be performed for hematochezia  Risks, benefits, limitations, and alternatives regarding  colonoscopy have been reviewed with the patient.  Questions have been answered.  All parties agreeable.   Ollen Bowl, MD  06/19/2015, 7:49 AM

## 2015-06-20 ENCOUNTER — Encounter: Payer: Self-pay | Admitting: Gastroenterology

## 2015-07-15 ENCOUNTER — Ambulatory Visit
Admission: EM | Admit: 2015-07-15 | Discharge: 2015-07-15 | Disposition: A | Discharge status: Home / Self Care | Payer: 59 | Attending: Family Medicine | Admitting: Family Medicine

## 2015-07-15 ENCOUNTER — Encounter: Payer: Self-pay | Admitting: Emergency Medicine

## 2015-07-15 DIAGNOSIS — J0100 Acute maxillary sinusitis, unspecified: Secondary | ICD-10-CM

## 2015-07-15 DIAGNOSIS — J40 Bronchitis, not specified as acute or chronic: Secondary | ICD-10-CM | POA: Diagnosis not present

## 2015-07-15 MED ORDER — HYDROCOD POLST-CPM POLST ER 10-8 MG/5ML PO SUER: 5.0000 mL | Freq: Two times a day (BID) | ORAL | Status: DC | PRN

## 2015-07-15 MED ORDER — AMOXICILLIN-POT CLAVULANATE 875-125 MG PO TABS: 1.0000 | ORAL_TABLET | Freq: Two times a day (BID) | ORAL | Status: DC

## 2015-07-15 MED ORDER — ALBUTEROL SULFATE HFA 108 (90 BASE) MCG/ACT IN AERS: 2.0000 | INHALATION_SPRAY | RESPIRATORY_TRACT | Status: DC | PRN

## 2015-07-15 MED ORDER — FEXOFENADINE-PSEUDOEPHED ER 180-240 MG PO TB24: 1.0000 | ORAL_TABLET | Freq: Every day | ORAL | Status: DC

## 2015-07-15 NOTE — ED Provider Notes (Signed)
CSN: [id number]     Arrival date & time 07/15/15  1209 History   First MD Initiated Contact with Patient 07/15/15 1546    Nurses notes were reviewed. Chief Complaint  Patient presents with  . Cough  . Facial Pain  . Nasal Congestion   She reports off-and-on symptoms of of URI and nasal congestion and cough. Things would get better and then she would have an exacerbation. This feeling off and on for about 2 weeks. She states that last 3 days cough and chest congestion has gotten worse. She reports shortness of breath walking up steps and doing other activities. Because of the increase shortness of breath she decided to come in to be seen and evaluated.  She does not smoke no significant family medical history is present with a concern about.    (Consider location/radiation/quality/duration/timing/severity/associated sxs/prior Treatment) Patient is a 45 y.o. female presenting with cough. The history is provided by the patient. No language interpreter was used.  Cough Cough characteristics:  Harsh and paroxysmal Severity:  Moderate Timing:  Constant Progression:  Worsening Chronicity:  New Context: upper respiratory infection and with activity   Context: not animal exposure, not smoke exposure and not weather changes   Relieved by:  Cough suppressants Ineffective treatments:  Cough suppressants Associated symptoms: rhinorrhea, shortness of breath and sinus congestion   Associated symptoms: no chest pain, no chills, no diaphoresis, no ear pain, no fever, no headaches, no rash, no sore throat and no wheezing     Past Medical History  Diagnosis Date  . Diabetes mellitus without complication ([place])     pre-diabetic  . Hypertension   . Bipolar 1 disorder ([place])   . BP (high blood pressure) 02/14/2014  . Biliary calculus with cholecystitis 08/14/2012  . Hyperprolactinemia ([place]) 06/21/2012  . Adult hypothyroidism 06/21/2012  . Bilateral polycystic ovarian syndrome 06/21/2012  . Diabetes  mellitus ([place]) 08/14/2012  . PONV (postoperative nausea and vomiting)   . Heart murmur     followed by PCP  . Dysrhythmia     wore heart monitor 2016. Corrected by changing Levothyroxine dose.  . Sleep apnea     has CPAP. has not used since 2011  . Shortness of breath dyspnea     stairs. related to wt.  . Vertigo     2x in last yr  . Motion sickness     carnival rides   Past Surgical History  Procedure Laterality Date  . Abdominal hysterectomy    . Gallbladder surgery    . Dental surgery    . Colonoscopy with propofol N/A 06/19/2015    Procedure: COLONOSCOPY WITH PROPOFOL;  Surgeon: [name], MD;  Location: [facility];  Service: Endoscopy;  Laterality: N/A;  Diabetic - oral meds CPAP   Family History  Problem Relation Age of Onset  . Diabetes Maternal Grandmother   . Hypertension Maternal Grandmother   . Hyperlipidemia Maternal Grandmother   . Diabetes Mother   . Hypertension Mother   . Hypothyroidism Maternal Grandmother   . Hypothyroidism Mother   . Bipolar disorder Mother    Social History  Substance Use Topics  . Smoking status: Former Research scientist (life sciences)  . Smokeless tobacco: Never Used     Comment: quit 2004  . Alcohol Use: No   OB History    No data available     Review of Systems  Constitutional: Negative for fever, chills and diaphoresis.  HENT: Positive for rhinorrhea. Negative for ear pain and sore throat.  Respiratory: Positive for cough and shortness of breath. Negative for wheezing.   Cardiovascular: Negative for chest pain.  Skin: Negative for rash.  Neurological: Negative for headaches.    Allergies  Lisinopril and Latex  Home Medications   Prior to Admission medications   Medication Sig Start Date End Date Taking? Authorizing Provider  albuterol (PROVENTIL HFA;VENTOLIN HFA) 108 (90 Base) MCG/ACT inhaler Inhale 2 puffs into the lungs every 4 (four) hours as needed for wheezing or shortness of breath. 07/15/15   Frederich Cha, MD   amoxicillin-clavulanate (AUGMENTIN) 875-125 MG tablet Take 1 tablet by mouth 2 (two) times daily. 07/15/15   Frederich Cha, MD  chlorpheniramine-HYDROcodone [facility] PENNKINETIC ER) 10-8 MG/5ML SUER Take 5 mLs by mouth 2 (two) times daily. Patient not taking: Reported on 06/12/2015 03/01/15   [name], PA-C  chlorpheniramine-HYDROcodone [facility] ER) 10-8 MG/5ML SUER Take 5 mLs by mouth every 12 (twelve) hours as needed for cough. 07/15/15   Frederich Cha, MD  doxycycline (VIBRAMYCIN) 100 MG capsule Take 1 capsule (100 mg total) by mouth 2 (two) times daily. Patient not taking: Reported on 06/12/2015 03/01/15   [name], PA-C  fexofenadine-pseudoephedrine (ALLEGRA-D ALLERGY & CONGESTION) 180-240 MG 24 hr tablet Take 1 tablet by mouth daily. 07/15/15   Frederich Cha, MD  lamoTRIgine (LAMICTAL) 100 MG tablet  05/03/15   Historical Provider, MD  lamoTRIgine (LAMICTAL) 200 MG tablet Take 200 mg by mouth daily.    Historical Provider, MD  lansoprazole (PREVACID) 30 MG capsule  05/04/15   Historical Provider, MD  levothyroxine (SYNTHROID, LEVOTHROID) 112 MCG tablet Take 112 mcg by mouth daily before breakfast. Currently takes 1/2 tab AM and 1/2 tab PM    Historical Provider, MD  losartan (COZAAR) 50 MG tablet Take 50 mg by mouth daily.    Historical Provider, MD  magnesium oxide (MAG-OX) 400 MG tablet Take by mouth. 12/10/14 12/10/15  Historical Provider, MD  Melatonin 5 MG CAPS Take 5 mg by mouth Nightly.    Historical Provider, MD  metFORMIN (GLUCOPHAGE) 500 MG tablet Take 500 mg by mouth 2 (two) times daily with a meal.    Historical Provider, MD  Multiple Vitamin (MULTIVITAMIN) tablet Take 1 tablet by mouth daily.    Historical Provider, MD  mupirocin ointment (BACTROBAN) 2 % Apply 1 application topically 3 (three) times daily. Patient not taking: Reported on 06/12/2015 03/01/15   [name], PA-C  mupirocin ointment (BACTROBAN) 2 % Apply 1 application topically 3 (three) times  daily. Patient not taking: Reported on 06/12/2015 05/24/15   Frederich Cha, MD  nadolol (CORGARD) 20 MG tablet  05/04/15   Historical Provider, MD  ondansetron (ZOFRAN ODT) 4 MG disintegrating tablet Take 1 tablet (4 mg total) by mouth every 8 (eight) hours as needed for nausea or vomiting. Patient not taking: Reported on 06/12/2015 04/08/15   Marylene Land, NP  ondansetron (ZOFRAN ODT) 8 MG disintegrating tablet Take 1 tablet (8 mg total) by mouth every 8 (eight) hours as needed for nausea or vomiting. 05/24/15   Frederich Cha, MD  ziprasidone (GEODON) 60 MG capsule Take 20 mg by mouth 2 (two) times daily with a meal.     Historical Provider, MD   Meds Ordered and Administered this Visit  Medications - No data to display  BP 144/103 mmHg  Pulse 90  Temp(Src) 98.9 F (37.2 C) (Oral)  Resp 16  Ht 5\' 6"  (1.676 m)  Wt 280 lb (127.007 kg)  BMI 45.21 kg/m2  SpO2 98% No data found.   Physical Exam  Constitutional: She is oriented to person, place, and time. She appears well-developed and well-nourished.  HENT:  Head: Normocephalic and atraumatic.  Right Ear: External ear normal.  Left Ear: External ear normal.  Mouth/Throat: Oropharynx is clear and moist.  Eyes: Pupils are equal, round, and reactive to light.  Neck: Normal range of motion. Neck supple. No tracheal deviation present.  Cardiovascular: Normal rate, regular rhythm and normal heart sounds.   No murmur heard. Pulmonary/Chest: Effort normal and breath sounds normal. No respiratory distress.  Musculoskeletal: Normal range of motion. She exhibits no tenderness.  Lymphadenopathy:    She has cervical adenopathy.  Neurological: She is alert and oriented to person, place, and time.  Skin: Skin is warm and dry. No erythema.  Psychiatric: She has a normal mood and affect. Her behavior is normal.  Vitals reviewed.   ED Course  Procedures (including critical care time)  Labs Review Labs Reviewed - No data to display  Imaging  Review No results found.   Visual Acuity Review  Right Eye Distance:   Left Eye Distance:   Bilateral Distance:    Right Eye Near:   Left Eye Near:    Bilateral Near:         MDM   1. Bronchitis   2. Acute maxillary sinusitis, recurrence not specified    Patient will be treated with Augmentin 875 one tablet twice a day for 10 days Allegra-D, Tussionex 1/2-1 teaspoon twice a day for the cough and albuterol for the bronchospasm reactive airways needed. Off work note patient declined states she had bronchitis earlier this summer and will continue to monitor her. She requests referral to primary care and will refer to [facility] office in this building. She also states the colonoscopy was significant for what well and she is pleased with the physician.    Frederich Cha, MD 07/15/15 [phone]

## 2015-07-15 NOTE — Discharge Instructions (Signed)
How to Use an Inhaler Using your inhaler correctly is very important. Good technique will make sure that the medicine reaches your lungs.  HOW TO USE AN INHALER:  Take the cap off the inhaler.  If this is the first time using your inhaler, you need to prime it. Shake the inhaler for 5 seconds. Release four puffs into the air, away from your face. Ask your doctor for help if you have questions.  Shake the inhaler for 5 seconds.  Turn the inhaler so the bottle is above the mouthpiece.  Put your pointer finger on top of the bottle. Your thumb holds the bottom of the inhaler.  Open your mouth.  Either hold the inhaler away from your mouth (the width of 2 fingers) or place your lips tightly around the mouthpiece. Ask your doctor which way to use your inhaler.  Breathe out as much air as possible.  Breathe in and push down on the bottle 1 time to release the medicine. You will feel the medicine go in your mouth and throat.  Continue to take a deep breath in very slowly. Try to fill your lungs.  After you have breathed in completely, hold your breath for 10 seconds. This will help the medicine to settle in your lungs. If you cannot hold your breath for 10 seconds, hold it for as long as you can before you breathe out.  Breathe out slowly, through pursed lips. Whistling is an example of pursed lips.  If your doctor has told you to take more than 1 puff, wait at least 15-30 seconds between puffs. This will help you get the best results from your medicine. Do not use the inhaler more than your doctor tells you to.  Put the cap back on the inhaler.  Follow the directions from your doctor or from the inhaler package about cleaning the inhaler. If you use more than one inhaler, ask your doctor which inhalers to use and what order to use them in. Ask your doctor to help you figure out when you will need to refill your inhaler.  If you use a steroid inhaler, always rinse your mouth with water  after your last puff, gargle and spit out the water. Do not swallow the water. GET HELP IF:  The inhaler medicine only partially helps to stop wheezing or shortness of breath.  You are having trouble using your inhaler.  You have some increase in thick spit (phlegm). GET HELP RIGHT AWAY IF:  The inhaler medicine does not help your wheezing or shortness of breath or you have tightness in your chest.  You have dizziness, headaches, or fast heart rate.  You have chills, fever, or night sweats.  You have a large increase of thick spit, or your thick spit is bloody. MAKE SURE YOU:   Understand these instructions.  Will watch your condition.  Will get help right away if you are not doing well or get worse.   This information is not intended to replace advice given to you by your health care provider. Make sure you discuss any questions you have with your health care provider.   Document Released: 04/06/2008 Document Revised: 04/18/2013 Document Reviewed: 01/25/2013 Elsevier Interactive Patient Education 2016 Elsevier Inc[employer]  Upper Respiratory Infection, Adult Most upper respiratory infections (URIs) are caused by a virus. A URI affects the nose, throat, and upper air passages. The most common type of URI is often called "the common cold." HOME CARE   Take medicines only as  told by your doctor.  Gargle warm saltwater or take cough drops to comfort your throat as told by your doctor.  Use a warm mist humidifier or inhale steam from a shower to increase air moisture. This may make it easier to breathe.  Drink enough fluid to keep your pee (urine) clear or pale yellow.  Eat soups and other clear broths.  Have a healthy diet.  Rest as needed.  Go back to work when your fever is gone or your doctor says it is okay.  You may need to stay home longer to avoid giving your URI to others.  You can also wear a face mask and wash your hands often to prevent spread of the virus.  Use  your inhaler more if you have asthma.  Do not use any tobacco products, including cigarettes, chewing tobacco, or electronic cigarettes. If you need help quitting, ask your doctor. GET HELP IF:  You are getting worse, not better.  Your symptoms are not helped by medicine.  You have chills.  You are getting more short of breath.  You have brown or red mucus.  You have yellow or brown discharge from your nose.  You have pain in your face, especially when you bend forward.  You have a fever.  You have puffy (swollen) neck glands.  You have pain while swallowing.  You have white areas in the back of your throat. GET HELP RIGHT AWAY IF:   You have very bad or constant:  Headache.  Ear pain.  Pain in your forehead, behind your eyes, and over your cheekbones (sinus pain).  Chest pain.  You have long-lasting (chronic) lung disease and any of the following:  Wheezing.  Long-lasting cough.  Coughing up blood.  A change in your usual mucus.  You have a stiff neck.  You have changes in your:  Vision.  Hearing.  Thinking.  Mood. MAKE SURE YOU:   Understand these instructions.  Will watch your condition.  Will get help right away if you are not doing well or get worse.   This information is not intended to replace advice given to you by your health care provider. Make sure you discuss any questions you have with your health care provider.   Document Released: 12/15/2007 Document Revised: 11/12/2014 Document Reviewed: 10/03/2013 Elsevier Interactive Patient Education 2016 Elsevier Inc.  Sinusitis, Adult Sinusitis is redness, soreness, and puffiness (inflammation) of the air pockets in the bones of your face (sinuses). The redness, soreness, and puffiness can cause air and mucus to get trapped in your sinuses. This can allow germs to grow and cause an infection.  HOME CARE   Drink enough fluids to keep your pee (urine) clear or pale yellow.  Use a  humidifier in your home.  Run a hot shower to create steam in the bathroom. Sit in the bathroom with the door closed. Breathe in the steam 3-4 times a day.  Put a warm, moist washcloth on your face 3-4 times a day, or as told by your doctor.  Use salt water sprays (saline sprays) to wet the thick fluid in your nose. This can help the sinuses drain.  Only take medicine as told by your doctor. GET HELP RIGHT AWAY IF:   Your pain gets worse.  You have very bad headaches.  You are sick to your stomach (nauseous).  You throw up (vomit).  You are very sleepy (drowsy) all the time.  Your face is puffy (swollen).  Your vision  changes.  You have a stiff neck.  You have trouble breathing. MAKE SURE YOU:   Understand these instructions.  Will watch your condition.  Will get help right away if you are not doing well or get worse.   This information is not intended to replace advice given to you by your health care provider. Make sure you discuss any questions you have with your health care provider.   Document Released: 12/15/2007 Document Revised: 07/19/2014 Document Reviewed: 02/01/2012 Elsevier Interactive Patient Education [employer].

## 2015-07-15 NOTE — ED Notes (Signed)
Patient c/o cough and chest congestion and nasal congestion since Thanksgiving.  Patient reports fevers.

## 2015-07-21 ENCOUNTER — Telehealth: Payer: Self-pay

## 2015-07-21 NOTE — ED Notes (Signed)
Spoke with patient and informed her that all stool tests were negative. States has since had a Colonoscopy and "all was normal". States diarrhea stopped after the Colonoscopy.

## 2015-07-23 ENCOUNTER — Ambulatory Visit
Admission: EM | Admit: 2015-07-23 | Discharge: 2015-07-23 | Disposition: A | Discharge status: Home / Self Care | Payer: 59 | Attending: Family Medicine | Admitting: Family Medicine

## 2015-07-23 ENCOUNTER — Encounter: Payer: Self-pay | Admitting: Emergency Medicine

## 2015-07-23 DIAGNOSIS — B379 Candidiasis, unspecified: Secondary | ICD-10-CM | POA: Diagnosis not present

## 2015-07-23 DIAGNOSIS — R0982 Postnasal drip: Secondary | ICD-10-CM | POA: Diagnosis not present

## 2015-07-23 MED ORDER — FLUCONAZOLE 150 MG PO TABS: 150.0000 mg | ORAL_TABLET | Freq: Every day | ORAL | Status: DC

## 2015-07-23 MED ORDER — FLUTICASONE PROPIONATE 50 MCG/ACT NA SUSP: 2.0000 | Freq: Every day | NASAL | Status: DC

## 2015-07-23 NOTE — ED Notes (Signed)
Patient c/o runny nose, cough and chest congestion since Thanksgiving.  Patient has been on an antibiotic since Jan. 3.

## 2015-07-23 NOTE — ED Provider Notes (Signed)
Patient presents today with symptoms of rhinorrhea, itchy watery eyes, thick postnasal drip. Patient states that she was seen here earlier in the month and given Augmentin for sinus infection and upper respiratory infection. She states that her cough is better but she has the symptoms above now. She has been taking the prescription cough medicine and also the antibiotic. She has not had to use her albuterol inhaler for her symptoms. She also states that she has a yeast infection now due to the Augmentin. She denies any nausea, vomiting, abdominal pain, diarrhea, severe headache, chest pain, shortness of breath.  ROS: Negative except mentioned above.  Vitals as per Epic. GENERAL: NAD HEENT: mild pharyngeal erythema, no exudate, no erythema of TMs, no cervical LAD RESP: CTA B CARD: RRR NEURO: CN II-XII grossly intact   A/P: Allergic Rhinitis, Postnasal Drip, Vaginal Yeast Infection- Will treat with Claritin or Zyrtec prn , Flonase prn, Diflucan, seek medical attention if symptoms persist or worsen as discussed.  Paulina Fusi, MD 07/23/15 [phone]

## 2015-08-27 ENCOUNTER — Encounter: Payer: Self-pay | Admitting: Family Medicine

## 2015-08-27 ENCOUNTER — Ambulatory Visit (INDEPENDENT_AMBULATORY_CARE_PROVIDER_SITE_OTHER): Payer: 59 | Admitting: Family Medicine

## 2015-08-27 VITALS — BP 138/82 | HR 78 | Resp 16 | Ht 66.0 in | Wt 284.0 lb

## 2015-08-27 DIAGNOSIS — L568 Other specified acute skin changes due to ultraviolet radiation: Secondary | ICD-10-CM | POA: Diagnosis not present

## 2015-08-27 DIAGNOSIS — E039 Hypothyroidism, unspecified: Secondary | ICD-10-CM

## 2015-08-27 DIAGNOSIS — K219 Gastro-esophageal reflux disease without esophagitis: Secondary | ICD-10-CM

## 2015-08-27 DIAGNOSIS — R002 Palpitations: Secondary | ICD-10-CM | POA: Diagnosis not present

## 2015-08-27 DIAGNOSIS — I10 Essential (primary) hypertension: Secondary | ICD-10-CM | POA: Diagnosis not present

## 2015-08-27 DIAGNOSIS — R7303 Prediabetes: Secondary | ICD-10-CM | POA: Diagnosis not present

## 2015-08-27 DIAGNOSIS — E282 Polycystic ovarian syndrome: Secondary | ICD-10-CM | POA: Diagnosis not present

## 2015-08-27 DIAGNOSIS — F309 Manic episode, unspecified: Secondary | ICD-10-CM | POA: Diagnosis not present

## 2015-08-27 DIAGNOSIS — G4700 Insomnia, unspecified: Secondary | ICD-10-CM

## 2015-08-27 DIAGNOSIS — E66813 Obesity, class 3: Secondary | ICD-10-CM

## 2015-08-27 DIAGNOSIS — E669 Obesity, unspecified: Secondary | ICD-10-CM | POA: Insufficient documentation

## 2015-08-27 MED ORDER — MUPIROCIN 2 % EX OINT: 1.0000 "application " | TOPICAL_OINTMENT | Freq: Three times a day (TID) | CUTANEOUS | Status: DC | PRN

## 2015-08-28 ENCOUNTER — Encounter: Payer: Self-pay | Admitting: Family Medicine

## 2015-08-28 ENCOUNTER — Other Ambulatory Visit: Payer: Self-pay | Admitting: Family Medicine

## 2015-08-28 DIAGNOSIS — G4700 Insomnia, unspecified: Secondary | ICD-10-CM | POA: Insufficient documentation

## 2015-08-28 DIAGNOSIS — R002 Palpitations: Secondary | ICD-10-CM | POA: Insufficient documentation

## 2015-08-28 DIAGNOSIS — K219 Gastro-esophageal reflux disease without esophagitis: Secondary | ICD-10-CM | POA: Insufficient documentation

## 2015-08-28 DIAGNOSIS — E559 Vitamin D deficiency, unspecified: Secondary | ICD-10-CM | POA: Insufficient documentation

## 2015-08-28 DIAGNOSIS — K648 Other hemorrhoids: Secondary | ICD-10-CM | POA: Insufficient documentation

## 2015-08-28 LAB — MAGNESIUM: MAGNESIUM: 2.3 mg/dL (ref 1.6–2.3)

## 2015-08-28 LAB — VITAMIN D 25 HYDROXY (VIT D DEFICIENCY, FRACTURES): Vit D, 25-Hydroxy: 16.4 ng/mL — ABNORMAL LOW (ref 30.0–100.0)

## 2015-08-28 MED ORDER — VITAMIN D 50 MCG (2000 UT) PO CAPS: 1.0000 | ORAL_CAPSULE | Freq: Every day | ORAL | Status: DC

## 2015-08-28 NOTE — Progress Notes (Signed)
Date:  08/27/2015   Name:  Lori Knight   DOB:  03/09/1971   MRN:  [id number]  PCP:  Sharyne Peach, MD    Chief Complaint: Establish Care   History of Present Illness:  This is a 45 y.o. female  To establish care. Has a rash over her arms and legs after going on cruise last week, used sunscreen but did not reapply. On Lamictal for bipolar 1 d/o, previously on Geodon, takes long acting and short acting dose daily, followed by Dr. Deneise Lever at [facility], saw last month. Hx palpitations on nadolol, resolved since split Synthroid dose, wants to d/c if possible. Also on losartan for HTN. Takes Prevacid daily for GERD sxs, willing to stop. On magnesium supplement because level low years ago. Hx chole for gallstones 2014. Hx prediabetes a1c 5.8 06/2015. Also hx of hypothyroidism and PCOS followed by Dr. Gabriel Carina on Synthroid and metformin. Colonoscopy 06/2015 by Dr. Roselyn Reef, int hemorrhoids only. Uses albuterol prn only. S/p hysterectomy for precancerous cells 2011. Tetanus booster 2013, last mammogram 6 years ago, last pelvic 2 yrs ago. Father died 44's alcoholism, mother bipolar with CAD.  Review of Systems:  Review of Systems  Constitutional: Negative for fever.  HENT: Negative for ear pain and sore throat.   Eyes: Negative for pain.  Respiratory: Negative for cough and shortness of breath.   Cardiovascular: Negative for chest pain and leg swelling.  Gastrointestinal: Negative for abdominal pain.  Endocrine: Negative for polyuria.  Genitourinary: Negative for difficulty urinating.  Neurological: Negative for syncope and light-headedness.    Patient Active Problem List   Diagnosis Date Noted  . Intermittent palpitations 08/28/2015  . GERD (gastroesophageal reflux disease) 08/28/2015  . Insomnia 08/28/2015  . Obesity, Class III, BMI 40-49.9 (morbid obesity) ([place]) 08/27/2015  . BP (high blood pressure) 02/14/2014  . Benign paroxysmal positional nystagmus 02/14/2014  . Benign neoplasm  of kidney 08/14/2012  . Bipolar I disorder, single manic episode ([place]) 08/14/2012  . Calculus of gallbladder 08/14/2012  . Biliary calculus with cholecystitis 08/14/2012  . Prediabetes 08/14/2012  . Hyperprolactinemia ([place]) 06/21/2012  . Adult hypothyroidism 06/21/2012  . Bilateral polycystic ovarian syndrome 06/21/2012    Prior to Admission medications   Medication Sig Start Date End Date Taking? Authorizing Provider  albuterol (PROVENTIL HFA;VENTOLIN HFA) 108 (90 Base) MCG/ACT inhaler Inhale 2 puffs into the lungs every 4 (four) hours as needed for wheezing or shortness of breath. 07/15/15  Yes Frederich Cha, MD  LamoTRIgine (LAMICTAL XR) 50 MG TB24 Take 1 tablet by mouth daily.   Yes Historical Provider, MD  lamoTRIgine (LAMICTAL) 200 MG tablet Take 200 mg by mouth daily.   Yes Historical Provider, MD  lansoprazole (PREVACID) 30 MG capsule  05/04/15  Yes Historical Provider, MD  levothyroxine (SYNTHROID, LEVOTHROID) 112 MCG tablet Take 112 mcg by mouth daily before breakfast. Currently takes 1/2 tab AM and 1/2 tab PM   Yes Historical Provider, MD  losartan (COZAAR) 50 MG tablet Take 50 mg by mouth daily.   Yes Historical Provider, MD  magnesium oxide (MAG-OX) 400 MG tablet Take 1 tablet by mouth daily. 12/10/14 12/10/15 Yes Historical Provider, MD  Melatonin 5 MG CAPS Take 5 mg by mouth Nightly.   Yes Historical Provider, MD  metFORMIN (GLUCOPHAGE) 500 MG tablet Take 500 mg by mouth 2 (two) times daily with a meal.   Yes Historical Provider, MD  Multiple Vitamin (MULTIVITAMIN) tablet Take 1 tablet by mouth daily.   Yes Historical Provider,  MD  mupirocin ointment (BACTROBAN) 2 % Apply 1 application topically 3 (three) times daily as needed. 08/27/15  Yes Adline Potter, MD  nadolol (CORGARD) 20 MG tablet Take 1 tablet by mouth daily. 05/04/15  Yes Historical Provider, MD  ondansetron (ZOFRAN ODT) 8 MG disintegrating tablet Take 1 tablet (8 mg total) by mouth every 8 (eight) hours as needed for  nausea or vomiting. 05/24/15  Yes Frederich Cha, MD    Allergies  Allergen Reactions  . Lisinopril Cough  . Latex Rash    Condoms only    Past Surgical History  Procedure Laterality Date  . Abdominal hysterectomy    . Gallbladder surgery    . Dental surgery    . Colonoscopy with propofol N/A 06/19/2015    Procedure: COLONOSCOPY WITH PROPOFOL;  Surgeon: [name], MD;  Location: [facility];  Service: Endoscopy;  Laterality: N/A;  Diabetic - oral meds CPAP    Social History  Substance Use Topics  . Smoking status: Former Smoker    Types: Cigarettes    Quit date: 07/12/2002  . Smokeless tobacco: Never Used     Comment: quit 2004  . Alcohol Use: No    Family History  Problem Relation Age of Onset  . Diabetes Maternal Grandmother   . Hypertension Maternal Grandmother   . Hyperlipidemia Maternal Grandmother   . Diabetes Mother   . Hypertension Mother   . Hypothyroidism Maternal Grandmother   . Hypothyroidism Mother   . Bipolar disorder Mother     Medication list has been reviewed and updated.  Physical Examination: BP 138/82 mmHg  Pulse 78  Resp 16  Ht 5\' 6"  (1.676 m)  Wt 284 lb (128.822 kg)  BMI 45.86 kg/m2  Physical Exam  Constitutional: She is oriented to person, place, and time. She appears well-developed and well-nourished.  HENT:  Right Ear: External ear normal.  Left Ear: External ear normal.  Nose: Nose normal.  Mouth/Throat: Oropharynx is clear and moist.  TM's clear  Eyes: Conjunctivae and EOM are normal. Pupils are equal, round, and reactive to light. No scleral icterus.  Neck: Neck supple. No thyromegaly present.  Cardiovascular: Normal rate, regular rhythm and normal heart sounds.   Pulmonary/Chest: Effort normal and breath sounds normal.  Abdominal: Soft. She exhibits no distension and no mass. There is no tenderness.  Musculoskeletal: She exhibits no edema.  Lymphadenopathy:    She has no cervical adenopathy.  Neurological: She is  alert and oriented to person, place, and time. Coordination normal.  Skin: Skin is warm and dry.  Diffuse erythematous rash over B arms and legs in sun exposure distribution  Psychiatric: She has a normal mood and affect. Her behavior is normal.  Nursing note and vitals reviewed.   Assessment and Plan:  1. Photosensitivity dermatitis Likely due to Lamictal, should resolve spontaneously  2. Essential hypertension Well controlled, consider d/c Mg if level ok - Magnesium  3. Obesity, Class III, BMI 40-49.9 (morbid obesity) ([place]) Weight going up on Lamictal, recommend she discuss with psychiatrist - Vitamin D (25 hydroxy)  4. Hypothyroidism, unspecified hypothyroidism type Well controlled (TSH normal 06/2015), followed by endo  5. Bilateral polycystic ovarian syndrome On metformin, followed by endo  6. Prediabetes Well controlled, followed by endo  7. Bipolar I disorder, single manic episode ([place]) Well controlled on Lamictal, followed by psych  8. Intermittent palpitations Resolved with Synthroid split dose, trial off nadolol  9. Gastroesophageal reflux disease, esophagitis presence not specified No recent sxs, trial off  Prevacid, possible rebound sxs discussed  10. Insomnia Well controlled on melatonin  Return in about 4 weeks (around 09/24/2015).  Satira Anis. [facility]  08/28/2015

## 2015-09-18 ENCOUNTER — Ambulatory Visit
Admission: EM | Admit: 2015-09-18 | Discharge: 2015-09-18 | Disposition: A | Discharge status: Home / Self Care | Payer: 59 | Attending: Family Medicine | Admitting: Family Medicine

## 2015-09-18 ENCOUNTER — Encounter: Payer: Self-pay | Admitting: *Deleted

## 2015-09-18 DIAGNOSIS — R100 Acute abdomen: Secondary | ICD-10-CM | POA: Diagnosis not present

## 2015-09-18 DIAGNOSIS — R5381 Other malaise: Secondary | ICD-10-CM

## 2015-09-18 DIAGNOSIS — R5383 Other fatigue: Secondary | ICD-10-CM | POA: Diagnosis not present

## 2015-09-18 DIAGNOSIS — R197 Diarrhea, unspecified: Secondary | ICD-10-CM | POA: Diagnosis not present

## 2015-09-18 DIAGNOSIS — R109 Unspecified abdominal pain: Secondary | ICD-10-CM

## 2015-09-18 LAB — URINALYSIS COMPLETE WITH MICROSCOPIC (ARMC ONLY)
Bilirubin Urine: NEGATIVE
GLUCOSE, UA: NEGATIVE mg/dL
HGB URINE DIPSTICK: NEGATIVE
Ketones, ur: NEGATIVE mg/dL
Leukocytes, UA: NEGATIVE
Nitrite: NEGATIVE
Protein, ur: NEGATIVE mg/dL
Specific Gravity, Urine: 1.025 (ref 1.005–1.030)
pH: 5.5 (ref 5.0–8.0)

## 2015-09-18 LAB — CBC WITH DIFFERENTIAL/PLATELET
BASOS ABS: 0.1 10*3/uL (ref 0–0.1)
Eosinophils Absolute: 0.2 10*3/uL (ref 0–0.7)
HCT: 43.6 % (ref 35.0–47.0)
Hemoglobin: 14.6 g/dL (ref 12.0–16.0)
Lymphs Abs: 2.7 10*3/uL (ref 1.0–3.6)
MCH: 29.6 pg (ref 26.0–34.0)
MCHC: 33.5 g/dL (ref 32.0–36.0)
MCV: 88.3 fL (ref 80.0–100.0)
Monocytes Absolute: 0.8 10*3/uL (ref 0.2–0.9)
Monocytes Relative: 8 %
NEUTROS ABS: 6.7 10*3/uL — AB (ref 1.4–6.5)
Neutrophils Relative %: 63 %
PLATELETS: 270 10*3/uL (ref 150–440)
RBC: 4.94 MIL/uL (ref 3.80–5.20)
RDW: 14.1 % (ref 11.5–14.5)
WBC: 10.6 10*3/uL (ref 3.6–11.0)

## 2015-09-18 LAB — COMPREHENSIVE METABOLIC PANEL
ALT: 46 U/L (ref 14–54)
AST: 28 U/L (ref 15–41)
Albumin: 4.2 g/dL (ref 3.5–5.0)
Alkaline Phosphatase: 75 U/L (ref 38–126)
Anion gap: 8 (ref 5–15)
BILIRUBIN TOTAL: 0.3 mg/dL (ref 0.3–1.2)
BUN: 13 mg/dL (ref 6–20)
CALCIUM: 9 mg/dL (ref 8.9–10.3)
CHLORIDE: 106 mmol/L (ref 101–111)
CO2: 23 mmol/L (ref 22–32)
CREATININE: 0.72 mg/dL (ref 0.44–1.00)
Glucose, Bld: 101 mg/dL — ABNORMAL HIGH (ref 65–99)
Potassium: 4.2 mmol/L (ref 3.5–5.1)
Sodium: 137 mmol/L (ref 135–145)
TOTAL PROTEIN: 7.4 g/dL (ref 6.5–8.1)

## 2015-09-18 LAB — LIPASE, BLOOD: LIPASE: 27 U/L (ref 11–51)

## 2015-09-18 LAB — AMYLASE: AMYLASE: 44 U/L (ref 28–100)

## 2015-09-18 MED ORDER — CIPROFLOXACIN HCL 500 MG PO TABS: 500.0000 mg | ORAL_TABLET | Freq: Two times a day (BID) | ORAL | Status: DC

## 2015-09-18 MED ORDER — FLUCONAZOLE 150 MG PO TABS: 150.0000 mg | ORAL_TABLET | Freq: Once | ORAL | Status: DC

## 2015-09-18 NOTE — Discharge Instructions (Signed)
Diarrhea Diarrhea is watery poop (stool). It can make you feel weak, tired, thirsty, or give you a dry mouth (signs of dehydration). Watery poop is a sign of another problem, most often an infection. It often lasts 2-3 days. It can last longer if it is a sign of something serious. Take care of yourself as told by your doctor. HOME CARE   Drink 1 cup (8 ounces) of fluid each time you have watery poop.  Do not drink the following fluids:  Those that contain simple sugars (fructose, glucose, galactose, lactose, sucrose, maltose).  Sports drinks.  Fruit juices.  Whole milk products.  Sodas.  Drinks with caffeine (coffee, tea, soda) or alcohol.  Oral rehydration solution may be used if the doctor says it is okay. You may make your own solution. Follow this recipe:   - teaspoon table salt.   teaspoon baking soda.   teaspoon salt substitute containing potassium chloride.  1 tablespoons sugar.  1 liter (34 ounces) of water.  Avoid the following foods:  High fiber foods, such as raw fruits and vegetables.  Nuts, seeds, and whole grain breads and cereals.   Those that are sweetened with sugar alcohols (xylitol, sorbitol, mannitol).  Try eating the following foods:  Starchy foods, such as rice, toast, pasta, low-sugar cereal, oatmeal, baked potatoes, crackers, and bagels.  Bananas.  Applesauce.  Eat probiotic-rich foods, such as yogurt and milk products that are fermented.  Wash your hands well after each time you have watery poop.  Only take medicine as told by your doctor.  Take a warm bath to help lessen burning or pain from having watery poop. GET HELP RIGHT AWAY IF:   You cannot drink fluids without throwing up (vomiting).  You keep throwing up.  You have blood in your poop, or your poop looks black and tarry.  You do not pee (urinate) in 6-8 hours, or there is only a small amount of very dark pee.  You have belly (abdominal) pain that gets worse or stays  in the same spot (localizes).  You are weak, dizzy, confused, or light-headed.  You have a very bad headache.  Your watery poop gets worse or does not get better.  You have a fever or lasting symptoms for more than 2-3 days.  You have a fever and your symptoms suddenly get worse. MAKE SURE YOU:   Understand these instructions.  Will watch your condition.  Will get help right away if you are not doing well or get worse.   This information is not intended to replace advice given to you by your health care provider. Make sure you discuss any questions you have with your health care provider.   Document Released: 12/15/2007 Document Revised: 07/19/2014 Document Reviewed: 03/05/2012 Elsevier Interactive Patient Education 2016 Elsevier Inc.  Abdominal Pain, Adult Many things can cause belly (abdominal) pain. Most times, the belly pain is not dangerous. Many cases of belly pain can be watched and treated at home. HOME CARE   Do not take medicines that help you go poop (laxatives) unless told to by your doctor.  Only take medicine as told by your doctor.  Eat or drink as told by your doctor. Your doctor will tell you if you should be on a special diet. GET HELP IF:  You do not know what is causing your belly pain.  You have belly pain while you are sick to your stomach (nauseous) or have runny poop (diarrhea).  You have pain while you  pee or poop.  Your belly pain wakes you up at night.  You have belly pain that gets worse or better when you eat.  You have belly pain that gets worse when you eat fatty foods.  You have a fever. GET HELP RIGHT AWAY IF:   The pain does not go away within 2 hours.  You keep throwing up (vomiting).  The pain changes and is only in the right or left part of the belly.  You have bloody or tarry looking poop. MAKE SURE YOU:   Understand these instructions.  Will watch your condition.  Will get help right away if you are not doing well  or get worse.   This information is not intended to replace advice given to you by your health care provider. Make sure you discuss any questions you have with your health care provider.   Document Released: 12/15/2007 Document Revised: 07/19/2014 Document Reviewed: 03/07/2013 Elsevier Interactive Patient Education 2016 Elsevier Inc.  Probiotics WHAT ARE PROBIOTICS? Probiotics are the good bacteria and yeasts that live in your body and keep you and your digestive system healthy. Probiotics also help your body's defense (immune) system and protect your body against bad bacterial growth.  Certain foods contain probiotics, such as yogurt. Probiotics can also be purchased as a supplement. As with any supplement or drug, it is important to discuss its use with your health care provider.  WHAT AFFECTS THE BALANCE OF BACTERIA IN MY BODY? The balance of bacteria in your body can be affected by:   Antibiotic medicines. Antibiotics are sometimes necessary to treat infection. Unfortunately, they may kill good or friendly bacteria in your body as well as the bad bacteria. This may lead to stomach problems like diarrhea, gas, and cramping.  Disease. Some conditions are the result of an overgrowth of bad bacteria, yeasts, parasites, or fungi. These conditions include:   Infectious diarrhea.  Stomach and respiratory infections.  Skin infections.  Irritable bowel syndrome (IBS).  Inflammatory bowel diseases.  Ulcer due to Helicobacter pylori (H. pylori) infection.  Tooth decay and periodontal disease.  Vaginal infections. Stress and poor diet may also lower the good bacteria in your body.  WHAT TYPE OF PROBIOTIC IS RIGHT FOR ME? Probiotics are available over the counter at your local pharmacy, health food, or grocery store. They come in many different forms, combinations of strains, and dosing strengths. Some may need to be refrigerated. Always read the label for storage and usage  instructions. Specific strains have been shown to be more effective for certain conditions. Ask your health care provider what option is best for you.  WHY WOULD I NEED PROBIOTICS? There are many reasons your health care provider might recommend a probiotic supplement, including:   Diarrhea.  Constipation.  IBS.  Respiratory infections.  Yeast infections.  Acne, eczema, and other skin conditions.  Frequent urinary tract infections (UTIs). ARE THERE SIDE EFFECTS OF PROBIOTICS? Some people experience mild side effects when taking probiotics. Side effects are usually temporary and may include:   Gas.  Bloating.  Cramping. Rarely, serious side effects, such as infection or immune system changes, may occur. WHAT ELSE DO I NEED TO KNOW ABOUT PROBIOTICS?   There are many different strains of probiotics. Certain strains may be more effective depending on your condition. Probiotics are available in varying doses. Ask your health care provider which probiotic you should use and how often.   If you are taking probiotics along with antibiotics, it is generally recommended to  wait at least 2 hours between taking the antibiotic and taking the probiotic.  FOR MORE INFORMATION:  [email or web address]   This information is not intended to replace advice given to you by your health care provider. Make sure you discuss any questions you have with your health care provider.   Document Released: 01/23/2014 Document Reviewed: 01/23/2014 Elsevier Interactive Patient Education [employer].  Weakness Weakness is a lack of strength. You may feel weak all over your body or just in one part of your body. Weakness can be serious. In some cases, you may need more medical tests. HOME [place] a well-balanced diet.  Try to exercise every day.  Only take medicines as told by your doctor. GET HELP RIGHT AWAY IF:   You  cannot do your normal daily activities.  You cannot walk up and down stairs, or you feel very tired when you do so.  You have shortness of breath or chest pain.  You have trouble moving parts of your body.  You have weakness in only one body part or on only one side of the body.  You have a fever.  You have trouble speaking or swallowing.  You cannot control when you pee (urinate) or poop (bowel movement).  You have black or bloody throw up (vomit) or poop.  Your weakness gets worse or spreads to other body parts.  You have new aches or pains. MAKE SURE YOU:   Understand these instructions.  Will watch your condition.  Will get help right away if you are not doing well or get worse.   This information is not intended to replace advice given to you by your health care provider. Make sure you discuss any questions you have with your health care provider.   Document Released: 06/10/2008 Document Revised: 12/28/2011 Document Reviewed: 08/27/2011 Elsevier Interactive Patient Education [employer].

## 2015-09-18 NOTE — ED Provider Notes (Addendum)
CSN: [id number]     Arrival date & time 09/18/15  [id number] History   First MD Initiated Contact with Patient 09/18/15 1057    Nurses notes were reviewed. Chief Complaint  Patient presents with  . Fatigue  . Diarrhea  . Generalized Body Aches    Patient's here because of vague problems. She's had diarrhea for since Sunday she states her boyfriend also had diarrhea for a few days last week but he seemed recovered. She is recently went to the [place] in February for over a week and came back from there. She denies any fever but feels fatigue, generalized body aches and diarrhea.  She states that she's having hot flashes but realizes she needs to see Dr. Vicente Masson for the hot flashes. She reports trying to go to work today but left edema make it because distance field cut. She states that she's had some nausea as well. Past smoking history is positive for prediabetes hypertension bipolar disease hypothyroidism or cystic ovarian disease diabetes mellitus sleep apnea and dyspnea.  She used to smoke. Mother also has diabetes, hypertension and bipolar disease. She's had cholecystectomy and abdominal hysterectomy.    (Consider location/radiation/quality/duration/timing/severity/associated sxs/prior Treatment) HPI  Past Medical History  Diagnosis Date  . Diabetes mellitus without complication ([place])     pre-diabetic  . Hypertension   . Bipolar 1 disorder ([place])   . BP (high blood pressure) 02/14/2014  . Biliary calculus with cholecystitis 08/14/2012  . Hyperprolactinemia ([place]) 06/21/2012  . Adult hypothyroidism 06/21/2012  . Bilateral polycystic ovarian syndrome 06/21/2012  . Diabetes mellitus ([place]) 08/14/2012  . PONV (postoperative nausea and vomiting)   . Heart murmur     followed by PCP  . Dysrhythmia     wore heart monitor 2016. Corrected by changing Levothyroxine dose.  . Sleep apnea     has CPAP. has not used since 2011  . Shortness of breath dyspnea     stairs. related to wt.  . Vertigo     2x  in last yr  . Motion sickness     carnival rides   Past Surgical History  Procedure Laterality Date  . Abdominal hysterectomy    . Gallbladder surgery    . Dental surgery    . Colonoscopy with propofol N/A 06/19/2015    Procedure: COLONOSCOPY WITH PROPOFOL;  Surgeon: [name], MD;  Location: [facility];  Service: Endoscopy;  Laterality: N/A;  Diabetic - oral meds CPAP   Family History  Problem Relation Age of Onset  . Diabetes Maternal Grandmother   . Hypertension Maternal Grandmother   . Hyperlipidemia Maternal Grandmother   . Diabetes Mother   . Hypertension Mother   . Hypothyroidism Maternal Grandmother   . Hypothyroidism Mother   . Bipolar disorder Mother    Social History  Substance Use Topics  . Smoking status: Former Smoker    Types: Cigarettes    Quit date: 07/12/2002  . Smokeless tobacco: Never Used     Comment: quit 2004  . Alcohol Use: No   OB History    No data available     Review of Systems  Constitutional: Positive for chills and fatigue.  Gastrointestinal: Positive for nausea and diarrhea.  Musculoskeletal: Positive for myalgias.  All other systems reviewed and are negative.   Allergies  Lisinopril and Latex  Home Medications   Prior to Admission medications   Medication Sig Start Date End Date Taking? Authorizing Provider  Cholecalciferol (VITAMIN D) 2000 units CAPS Take 1 capsule (2,000  Units total) by mouth daily. 08/28/15  Yes Adline Potter, MD  LamoTRIgine (LAMICTAL XR) 50 MG TB24 Take 1 tablet by mouth daily.   Yes Historical Provider, MD  lamoTRIgine (LAMICTAL) 200 MG tablet Take 200 mg by mouth daily.   Yes Historical Provider, MD  levothyroxine (SYNTHROID, LEVOTHROID) 112 MCG tablet Take 0.5 tablets by mouth 2 (two) times daily.   Yes Historical Provider, MD  losartan (COZAAR) 50 MG tablet Take 50 mg by mouth daily.   Yes Historical Provider, MD  Melatonin 5 MG CAPS Take 5 mg by mouth Nightly.   Yes Historical Provider, MD   metFORMIN (GLUCOPHAGE) 500 MG tablet Take 500 mg by mouth 2 (two) times daily with a meal.   Yes Historical Provider, MD  Multiple Vitamin (MULTIVITAMIN) tablet Take 1 tablet by mouth daily.   Yes Historical Provider, MD  albuterol (PROVENTIL HFA;VENTOLIN HFA) 108 (90 Base) MCG/ACT inhaler Inhale 2 puffs into the lungs every 4 (four) hours as needed for wheezing or shortness of breath. 07/15/15   Frederich Cha, MD  ciprofloxacin (CIPRO) 500 MG tablet Take 1 tablet (500 mg total) by mouth 2 (two) times daily. 09/18/15   Frederich Cha, MD  mupirocin ointment (BACTROBAN) 2 % Apply 1 application topically 3 (three) times daily as needed. 08/27/15   Adline Potter, MD   Meds Ordered and Administered this Visit  Medications - No data to display  BP 148/92 mmHg  Pulse 86  Temp(Src) 97.5 F (36.4 C) (Oral)  Resp 18  Ht 5\' 6"  (1.676 m)  Wt 285 lb (129.275 kg)  BMI 46.02 kg/m2  SpO2 98% No data found.   Physical Exam  Constitutional: She is oriented to person, place, and time. She appears well-developed and well-nourished.  Obese WF  HENT:  Head: Normocephalic and atraumatic.  Right Ear: External ear normal.  Left Ear: External ear normal.  Mouth/Throat: Oropharynx is clear and moist.  Eyes: Pupils are equal, round, and reactive to light.  Neck: Normal range of motion. Neck supple. No tracheal deviation present.  Cardiovascular: Normal rate, regular rhythm and normal heart sounds.   Pulmonary/Chest: Effort normal and breath sounds normal. No respiratory distress.  Abdominal: Soft. Bowel sounds are normal. There is no hepatosplenomegaly. There is no tenderness. There is no CVA tenderness.  Musculoskeletal: Normal range of motion. She exhibits no tenderness.  Lymphadenopathy:    She has no cervical adenopathy.  Neurological: She is alert and oriented to person, place, and time. No cranial nerve deficit.  Skin: Skin is warm and dry.  Vitals reviewed.   ED Course  Procedures (including critical  care time)  Labs Review Labs Reviewed  COMPREHENSIVE METABOLIC PANEL - Abnormal; Notable for the following:    Glucose, Bld 101 (*)    All other components within normal limits  URINALYSIS COMPLETEWITH MICROSCOPIC ([facility] ONLY) - Abnormal; Notable for the following:    Bacteria, UA RARE (*)    Squamous Epithelial / LPF 0-5 (*)    All other components within normal limits  URINE CULTURE  AMYLASE  LIPASE, BLOOD  CBC WITH DIFFERENTIAL/PLATELET    Imaging Review No results found.   Visual Acuity Review  Right Eye Distance:   Left Eye Distance:   Bilateral Distance:    Right Eye Near:   Left Eye Near:    Bilateral Near:    Results for orders placed or performed during the hospital encounter of 09/18/15  Amylase  Result Value Ref Range   Amylase 44 28 -  100 U/L  Comprehensive metabolic panel  Result Value Ref Range   Sodium 137 135 - 145 mmol/L   Potassium 4.2 3.5 - 5.1 mmol/L   Chloride 106 101 - 111 mmol/L   CO2 23 22 - 32 mmol/L   Glucose, Bld 101 (H) 65 - 99 mg/dL   BUN 13 6 - 20 mg/dL   Creatinine, Ser 0.72 0.44 - 1.00 mg/dL   Calcium 9.0 8.9 - 10.3 mg/dL   Total Protein 7.4 6.5 - 8.1 g/dL   Albumin 4.2 3.5 - 5.0 g/dL   AST 28 15 - 41 U/L   ALT 46 14 - 54 U/L   Alkaline Phosphatase 75 38 - 126 U/L   Total Bilirubin 0.3 0.3 - 1.2 mg/dL   GFR calc non Af Amer >60 >60 mL/min   GFR calc Af Amer >60 >60 mL/min   Anion gap 8 5 - 15  Urinalysis complete, with microscopic  Result Value Ref Range   Color, Urine YELLOW YELLOW   APPearance CLEAR CLEAR   Glucose, UA NEGATIVE NEGATIVE mg/dL   Bilirubin Urine NEGATIVE NEGATIVE   Ketones, ur NEGATIVE NEGATIVE mg/dL   Specific Gravity, Urine 1.025 1.005 - 1.030   Hgb urine dipstick NEGATIVE NEGATIVE   pH 5.5 5.0 - 8.0   Protein, ur NEGATIVE NEGATIVE mg/dL   Nitrite NEGATIVE NEGATIVE   Leukocytes, UA NEGATIVE NEGATIVE   RBC / HPF 0-5 0 - 5 RBC/hpf   WBC, UA 0-5 0 - 5 WBC/hpf   Bacteria, UA RARE (A) NONE SEEN    Squamous Epithelial / LPF 0-5 (A) NONE SEEN   Mucous PRESENT   Lipase, blood  Result Value Ref Range   Lipase 27 11 - 51 U/L       MDM   1. Diarrhea, unspecified type   2. Malaise and fatigue   3. Abdominal pain, acute      The diarrhea is of concern explained to patient we can get stool specimens since she is just came [place] from [place] the [place] but she is not too thrilled about that apparently 1 week at stool specimens on her back in December she had nausea and vomiting after she elected the specimen since she did travel outside the country and she's been having diarrhea for 7 more days if urine shows anything abnormal we'll place him Cipro for 5 days. Will get CBC CMP dysmature not missing anything else. Urine was negative lab work essentially normal will place her on Cipro for 3-5 days for the diarrhea and if persistently to come back this specimen will give a work note for today and tomorrow.   Patient discharged informed staff that she has some questions about being put on Cipro in the we discussed the report on about a 3 day course if the urine was negative which we did. Since she did not want to bring a stool specimen in which she states made her sick the last time she did that in November. Also because she yeast infection she goes on Cipro will place her on Diflucan as well. An option is for her not to take Cipro and to see if this diarrhea clears on his.  Note: This dictation was prepared with Dragon dictation along with smaller phrase technology. Any transcriptional errors that result from this process are unintentional.  Frederich Cha, MD 09/18/15 [name], MD 09/18/15 1245

## 2015-09-18 NOTE — ED Notes (Signed)
Patient started having nausea and diarrhea 5 days ago. Fatigue and generalized weakness started 2 weeks ago.

## 2015-09-20 LAB — URINE CULTURE: SPECIAL REQUESTS: NORMAL

## 2015-09-22 ENCOUNTER — Encounter: Payer: Self-pay | Admitting: Family Medicine

## 2015-09-22 ENCOUNTER — Ambulatory Visit (INDEPENDENT_AMBULATORY_CARE_PROVIDER_SITE_OTHER): Payer: 59 | Admitting: Family Medicine

## 2015-09-22 VITALS — BP 130/100 | HR 88 | Ht 66.0 in | Wt 287.0 lb

## 2015-09-22 DIAGNOSIS — I10 Essential (primary) hypertension: Secondary | ICD-10-CM | POA: Diagnosis not present

## 2015-09-22 DIAGNOSIS — R5383 Other fatigue: Secondary | ICD-10-CM

## 2015-09-22 DIAGNOSIS — M791 Myalgia, unspecified site: Secondary | ICD-10-CM

## 2015-09-22 DIAGNOSIS — G4733 Obstructive sleep apnea (adult) (pediatric): Secondary | ICD-10-CM | POA: Insufficient documentation

## 2015-09-22 DIAGNOSIS — R7303 Prediabetes: Secondary | ICD-10-CM

## 2015-09-22 DIAGNOSIS — E559 Vitamin D deficiency, unspecified: Secondary | ICD-10-CM | POA: Diagnosis not present

## 2015-09-22 DIAGNOSIS — Z9989 Dependence on other enabling machines and devices: Secondary | ICD-10-CM

## 2015-09-22 DIAGNOSIS — E039 Hypothyroidism, unspecified: Secondary | ICD-10-CM | POA: Diagnosis not present

## 2015-09-23 ENCOUNTER — Other Ambulatory Visit: Payer: Self-pay | Admitting: Family Medicine

## 2015-09-23 ENCOUNTER — Telehealth: Payer: Self-pay | Admitting: Emergency Medicine

## 2015-09-23 LAB — VITAMIN B12: Vitamin B-12: 580 pg/mL (ref 211–946)

## 2015-09-23 LAB — HEMOGLOBIN A1C
Est. average glucose Bld gHb Est-mCnc: 137 mg/dL
Hgb A1c MFr Bld: 6.4 % — ABNORMAL HIGH (ref 4.8–5.6)

## 2015-09-23 LAB — CK: CK TOTAL: 86 U/L (ref 24–173)

## 2015-09-23 LAB — MAGNESIUM: Magnesium: 2.1 mg/dL (ref 1.6–2.3)

## 2015-09-23 LAB — SEDIMENTATION RATE: SED RATE: 2 mm/h (ref 0–32)

## 2015-09-23 LAB — TSH: TSH: 5.64 u[IU]/mL — AB (ref 0.450–4.500)

## 2015-09-23 MED ORDER — LEVOTHYROXINE SODIUM 125 MCG PO TABS: 62.5000 ug | ORAL_TABLET | Freq: Two times a day (BID) | ORAL | Status: DC

## 2015-09-23 NOTE — Progress Notes (Signed)
Date:  09/22/2015   Name:  Lori Knight   DOB:  11-28-1970   MRN:  [id number]  PCP:  Adline Potter, MD    Chief Complaint: Muscle Pain   History of Present Illness:  This is a 45 y.o. female for one month f/u, seen [facility] last week for diarrhea, place on Cipro as had traveled overseas lately. Also c/o diffuse myalgias and fatigue past month. Reports GERD sxs recurred off Prevacid but have not progressed. Needs CPAP machine recalibrated. Taking vit D 5000 units daily because more is better. Palps ok off nadolol and Mg. Unsure if had mono in past.  Review of Systems:  Review of Systems  Constitutional: Negative for fever.  HENT: Negative for rhinorrhea and sore throat.   Respiratory: Negative for cough and shortness of breath.   Cardiovascular: Negative for chest pain and leg swelling.  Gastrointestinal: Negative for abdominal pain.  Neurological: Negative for syncope and light-headedness.  Hematological: Negative for adenopathy.    Patient Active Problem List   Diagnosis Date Noted  . OSA on CPAP 09/22/2015  . Intermittent palpitations 08/28/2015  . GERD (gastroesophageal reflux disease) 08/28/2015  . Insomnia 08/28/2015  . Internal hemorrhoids 08/28/2015  . Vitamin D deficiency 08/28/2015  . Obesity, Class III, BMI 40-49.9 (morbid obesity) ([place]) 08/27/2015  . BP (high blood pressure) 02/14/2014  . Benign paroxysmal positional nystagmus 02/14/2014  . 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 Bipolar 1 disorder ([place]) 08/14/2012  . Prediabetes 08/14/2012  . Adult hypothyroidism 06/21/2012  . Bilateral polycystic ovarian syndrome 06/21/2012    Prior to Admission medications   Medication Sig Start Date End Date Taking? Authorizing Provider  albuterol (PROVENTIL HFA;VENTOLIN HFA) 108 (90 Base) MCG/ACT inhaler Inhale 2 puffs into the lungs every 4 (four) hours as needed for wheezing or shortness of breath. 07/15/15  Yes Frederich Cha, MD  Cholecalciferol (VITAMIN D) 2000 units CAPS  Take 1 capsule (2,000 Units total) by mouth daily. 08/28/15  Yes Adline Potter, MD  LamoTRIgine (LAMICTAL XR) 50 MG TB24 Take 1 tablet by mouth daily.   Yes Historical Provider, MD  lamoTRIgine (LAMICTAL) 200 MG tablet Take 200 mg by mouth daily.   Yes Historical Provider, MD  levothyroxine (SYNTHROID, LEVOTHROID) 112 MCG tablet Take 0.5 tablets by mouth 2 (two) times daily.   Yes Historical Provider, MD  losartan (COZAAR) 50 MG tablet Take 50 mg by mouth 2 (two) times daily.    Yes Historical Provider, MD  Melatonin 5 MG CAPS Take 15 mg by mouth Nightly.    Yes Historical Provider, MD  metFORMIN (GLUCOPHAGE) 500 MG tablet Take 500 mg by mouth daily.   Yes Historical Provider, MD  Multiple Vitamin (MULTIVITAMIN) tablet Take 1 tablet by mouth daily.   Yes Historical Provider, MD  mupirocin ointment (BACTROBAN) 2 % Apply 1 application topically 3 (three) times daily as needed. 08/27/15  Yes Adline Potter, MD    Allergies  Allergen Reactions  . Lisinopril Cough  . Latex Rash    Condoms only    Past Surgical History  Procedure Laterality Date  . Abdominal hysterectomy    . Gallbladder surgery    . Dental surgery    . Colonoscopy with propofol N/A 06/19/2015    Procedure: COLONOSCOPY WITH PROPOFOL;  Surgeon: [name], MD;  Location: [facility];  Service: Endoscopy;  Laterality: N/A;  Diabetic - oral meds CPAP    Social History  Substance Use Topics  . Smoking status: Former Smoker  Types: Cigarettes    Quit date: 07/12/2002  . Smokeless tobacco: Never Used     Comment: quit 2004  . Alcohol Use: No    Family History  Problem Relation Age of Onset  . Diabetes Maternal Grandmother   . Hypertension Maternal Grandmother   . Hyperlipidemia Maternal Grandmother   . Diabetes Mother   . Hypertension Mother   . Hypothyroidism Maternal Grandmother   . Hypothyroidism Mother   . Bipolar disorder Mother     Medication list has been reviewed and updated.  Physical  Examination: BP 130/100 mmHg  Pulse 88  Ht _0  (1.676 m)  Wt 287 lb (130.182 kg)  BMI 46.35 kg/m2  Physical Exam  Constitutional: She appears well-developed and well-nourished.  Cardiovascular: Normal rate, regular rhythm and normal heart sounds.   Pulmonary/Chest: Effort normal and breath sounds normal.  Abdominal: Soft. She exhibits no distension and no mass. There is no tenderness.  Musculoskeletal: She exhibits no edema.  Neurological: She is alert.  Skin: Skin is warm and dry.  Psychiatric: She has a normal mood and affect. Her behavior is normal.  Nursing note and vitals reviewed.   Assessment and Plan:  1. Myalgia Unclear etiology, rapid flu negative, check labs - Magnesium - B12 - CK (Creatine Kinase) - Sed Rate (ESR) - POCT Influenza A/B  2. Other fatigue As above - POCT Influenza A/B  3. Prediabetes Unclear control - HgB A1c  4. Vitamin D deficiency Decrease vit D supplement to 2000 units daily or 5000 units qod  5. Essential hypertension Marginal control today but well controlled last visit, cont losartan bid  6. Hypothyroidism, unspecified hypothyroidism type Check control on split dose Synthroid - TSH  7. OSA on CPAP Needs CPAP recalibration - Ambulatory referral to Sleep Studies  Return in about 4 weeks (around 10/20/2015).  Satira Anis. [facility]  09/23/2015

## 2015-09-23 NOTE — ED Notes (Signed)
Patient was notified that the urine that was collected at [facility], the lab was unable to run a urine culture on it.  Patient states that she was not feeling better and followed up with her PCP Dr. Vicente Masson yesterday Monday, March 13 and had additional tests performed.  Refer to PCP notes.  Patient was instructed to follow-up with her PCP if her symptoms persist or worsen.  Patient verbalized understanding.

## 2015-10-01 ENCOUNTER — Ambulatory Visit: Payer: 59 | Admitting: Family Medicine

## 2015-10-02 ENCOUNTER — Telehealth: Payer: Self-pay

## 2015-10-02 ENCOUNTER — Other Ambulatory Visit: Payer: Self-pay

## 2015-10-02 NOTE — Telephone Encounter (Signed)
Sent to [place] 

## 2015-10-02 NOTE — Telephone Encounter (Signed)
Ok thanks 

## 2015-10-27 ENCOUNTER — Ambulatory Visit (INDEPENDENT_AMBULATORY_CARE_PROVIDER_SITE_OTHER): Payer: 59 | Admitting: Family Medicine

## 2015-10-27 ENCOUNTER — Encounter: Payer: Self-pay | Admitting: Family Medicine

## 2015-10-27 VITALS — BP 160/108 | HR 88 | Ht 66.0 in | Wt 294.4 lb

## 2015-10-27 DIAGNOSIS — I10 Essential (primary) hypertension: Secondary | ICD-10-CM | POA: Diagnosis not present

## 2015-10-27 DIAGNOSIS — G4733 Obstructive sleep apnea (adult) (pediatric): Secondary | ICD-10-CM | POA: Diagnosis not present

## 2015-10-27 DIAGNOSIS — F319 Bipolar disorder, unspecified: Secondary | ICD-10-CM | POA: Diagnosis not present

## 2015-10-27 DIAGNOSIS — E039 Hypothyroidism, unspecified: Secondary | ICD-10-CM

## 2015-10-27 DIAGNOSIS — E559 Vitamin D deficiency, unspecified: Secondary | ICD-10-CM | POA: Diagnosis not present

## 2015-10-27 DIAGNOSIS — R7303 Prediabetes: Secondary | ICD-10-CM | POA: Diagnosis not present

## 2015-10-27 DIAGNOSIS — Z9989 Dependence on other enabling machines and devices: Secondary | ICD-10-CM

## 2015-10-27 DIAGNOSIS — R5383 Other fatigue: Secondary | ICD-10-CM

## 2015-10-27 MED ORDER — HYDROCHLOROTHIAZIDE 25 MG PO TABS: 25.0000 mg | ORAL_TABLET | Freq: Every day | ORAL | Status: DC

## 2015-10-27 NOTE — Progress Notes (Signed)
Date:  10/27/2015   Name:  Lori Knight   DOB:  1971/06/21   MRN:  [id number]  PCP:  Adline Potter, MD    Chief Complaint: Follow-up; Fatigue; Myalgia; and Weight Gain   History of Present Illness:  This is a 45 y.o. female seen in one month f/u for fatigue, myalgias improved but still having significant daytime fatigue, blood work unrevealing except high TSH, Synthroid dose increased. Prediabetes worse with a1c increased from 5.8% to 6.4%. Weight up 7# since last, thinks may be due to psych meds but also not exercising, feels bipolar d/o well controlled. Scheduled for CPAP titration later this week. Taking vit D supp daily.  Review of Systems:  Review of Systems  Constitutional: Negative for fever.  Respiratory: Negative for cough and shortness of breath.   Cardiovascular: Negative for chest pain and leg swelling.  Endocrine: Negative for polyuria.  Genitourinary: Negative for difficulty urinating.  Skin: Negative for rash.  Neurological: Negative for syncope and light-headedness.    Patient Active Problem List   Diagnosis Date Noted  . OSA on CPAP 09/22/2015  . Intermittent palpitations 08/28/2015  . GERD (gastroesophageal reflux disease) 08/28/2015  . Insomnia 08/28/2015  . Internal hemorrhoids 08/28/2015  . Vitamin D deficiency 08/28/2015  . Obesity, Class III, BMI 40-49.9 (morbid obesity) ([place]) 08/27/2015  . Hypertension 02/14/2014  . Benign paroxysmal positional nystagmus 02/14/2014  . 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 Bipolar 1 disorder ([place]) 08/14/2012  . Prediabetes 08/14/2012  . Adult hypothyroidism 06/21/2012  . Bilateral polycystic ovarian syndrome 06/21/2012    Prior to Admission medications   Medication Sig Start Date End Date Taking? Authorizing Provider  albuterol (PROVENTIL HFA;VENTOLIN HFA) 108 (90 Base) MCG/ACT inhaler Inhale 2 puffs into the lungs every 4 (four) hours as needed for wheezing or shortness of breath. 07/15/15  Yes Frederich Cha, MD   Cholecalciferol (VITAMIN D) 2000 units CAPS Take 1 capsule (2,000 Units total) by mouth daily. 08/28/15  Yes Adline Potter, MD  LamoTRIgine (LAMICTAL XR) 50 MG TB24 Take 1 tablet by mouth daily.   Yes Historical Provider, MD  lamoTRIgine (LAMICTAL) 200 MG tablet Take 200 mg by mouth daily.   Yes Historical Provider, MD  levothyroxine (SYNTHROID, LEVOTHROID) 125 MCG tablet Take 0.5 tablets (62.5 mcg total) by mouth 2 (two) times daily. 09/23/15  Yes Adline Potter, MD  losartan (COZAAR) 50 MG tablet Take 50 mg by mouth 2 (two) times daily.    Yes Historical Provider, MD  Melatonin 5 MG CAPS Take 15 mg by mouth Nightly.    Yes Historical Provider, MD  metFORMIN (GLUCOPHAGE) 500 MG tablet Take 500 mg by mouth 2 (two) times daily.   Yes Historical Provider, MD  Multiple Vitamin (MULTIVITAMIN) tablet Take 1 tablet by mouth daily.   Yes Historical Provider, MD  hydrochlorothiazide (HYDRODIURIL) 25 MG tablet Take 1 tablet (25 mg total) by mouth daily. 10/27/15   Adline Potter, MD    Allergies  Allergen Reactions  . Lisinopril Cough  . Latex Rash    Condoms only    Past Surgical History  Procedure Laterality Date  . Abdominal hysterectomy    . Gallbladder surgery    . Dental surgery    . Colonoscopy with propofol N/A 06/19/2015    Procedure: COLONOSCOPY WITH PROPOFOL;  Surgeon: [name], MD;  Location: [facility];  Service: Endoscopy;  Laterality: N/A;  Diabetic - oral meds CPAP    Social History  Substance Use Topics  . Smoking  status: Former Smoker    Types: Cigarettes    Quit date: 07/12/2002  . Smokeless tobacco: Never Used     Comment: quit 2004  . Alcohol Use: No    Family History  Problem Relation Age of Onset  . Diabetes Maternal Grandmother   . Hypertension Maternal Grandmother   . Hyperlipidemia Maternal Grandmother   . Diabetes Mother   . Hypertension Mother   . Hypothyroidism Maternal Grandmother   . Hypothyroidism Mother   . Bipolar disorder Mother      Medication list has been reviewed and updated.  Physical Examination: BP 160/108 mmHg  Pulse 88  Ht 5\' 6"  (1.676 m)  Wt 294 lb 6.4 oz (133.539 kg)  BMI 47.54 kg/m2  Physical Exam  Constitutional: She appears well-developed and well-nourished.  Cardiovascular: Normal rate, regular rhythm and normal heart sounds.   Pulmonary/Chest: Effort normal and breath sounds normal.  Musculoskeletal: She exhibits no edema.  Neurological: She is alert.  Skin: Skin is warm and dry.  Psychiatric: She has a normal mood and affect. Her behavior is normal.  Nursing note and vitals reviewed.   Assessment and Plan:  1. Other fatigue Unclear etiology, may represent CFS/fibromyalgia syndrome, consider gabapentin/Lyrica if CPAP titration ineffective  2. OSA on CPAP For CPAP titration this week  3. Essential hypertension Poor control on losartan bid, add HCTZ  4. Hypothyroidism, unspecified hypothyroidism type On increased Synthroid dose, consider TSH next visit  5. Prediabetes Poor control, has already increased metformin to bid  6. Obesity, Class III, BMI 40-49.9 (morbid obesity) ([place]) Weight gain continues, pt to discuss with psychiatrist, exercise 150 mins/week advised  7. Vitamin D deficiency On supp, consider recheck level next visit  8. Bipolar d/o Followed by psych   Return in about 4 weeks (around 11/24/2015).  Satira Anis. [facility]  10/27/2015

## 2015-10-28 ENCOUNTER — Ambulatory Visit: Payer: 59 | Attending: Internal Medicine

## 2015-10-28 DIAGNOSIS — G4733 Obstructive sleep apnea (adult) (pediatric): Secondary | ICD-10-CM | POA: Insufficient documentation

## 2015-11-24 ENCOUNTER — Other Ambulatory Visit: Payer: Self-pay | Admitting: Family Medicine

## 2015-11-24 MED ORDER — LEVOTHYROXINE SODIUM 125 MCG PO TABS: 62.5000 ug | ORAL_TABLET | Freq: Two times a day (BID) | ORAL | Status: DC

## 2015-11-27 ENCOUNTER — Ambulatory Visit (INDEPENDENT_AMBULATORY_CARE_PROVIDER_SITE_OTHER): Payer: 59 | Admitting: Family Medicine

## 2015-11-27 ENCOUNTER — Encounter: Payer: Self-pay | Admitting: Family Medicine

## 2015-11-27 VITALS — BP 128/87 | HR 82 | Resp 16 | Ht 66.0 in | Wt 281.0 lb

## 2015-11-27 DIAGNOSIS — G4733 Obstructive sleep apnea (adult) (pediatric): Secondary | ICD-10-CM

## 2015-11-27 DIAGNOSIS — I10 Essential (primary) hypertension: Secondary | ICD-10-CM | POA: Diagnosis not present

## 2015-11-27 DIAGNOSIS — Z9989 Dependence on other enabling machines and devices: Secondary | ICD-10-CM

## 2015-11-27 DIAGNOSIS — E559 Vitamin D deficiency, unspecified: Secondary | ICD-10-CM

## 2015-11-27 DIAGNOSIS — R7303 Prediabetes: Secondary | ICD-10-CM

## 2015-11-27 DIAGNOSIS — K648 Other hemorrhoids: Secondary | ICD-10-CM | POA: Diagnosis not present

## 2015-11-27 DIAGNOSIS — F319 Bipolar disorder, unspecified: Secondary | ICD-10-CM

## 2015-11-27 DIAGNOSIS — E039 Hypothyroidism, unspecified: Secondary | ICD-10-CM | POA: Diagnosis not present

## 2015-11-27 NOTE — Progress Notes (Signed)
Date:  11/27/2015   Name:  Lori Knight   DOB:  1970-08-11   MRN:  [id number]  PCP:  Adline Potter, MD    Chief Complaint: Hypertension and Diarrhea   History of Present Illness:  This is a 45 y.o. female seen in one month f/u. Myalgias/insomnia improved with CPAP titration. HCTZ added to losartan last visit, tolerating well. Did have some diarrhea and painless rectal bleeding earlier this month, known internal hemorrhoids with otherwise negative colonoscopy in December. Has psych appt tomorrow to discuss increased agitation. Synthroid dose increased in March. Taking vit D supplement.  Review of Systems:  Review of Systems  Constitutional: Negative for fever and chills.  Respiratory: Negative for cough and shortness of breath.   Cardiovascular: Negative for chest pain and leg swelling.  Endocrine: Negative for polyuria.  Genitourinary: Negative for difficulty urinating.  Neurological: Negative for syncope and light-headedness.    Patient Active Problem List   Diagnosis Date Noted  . OSA on CPAP 09/22/2015  . Intermittent palpitations 08/28/2015  . GERD (gastroesophageal reflux disease) 08/28/2015  . Insomnia 08/28/2015  . Internal hemorrhoids 08/28/2015  . Vitamin D deficiency 08/28/2015  . Obesity, Class III, BMI 40-49.9 (morbid obesity) ([place]) 08/27/2015  . Hypertension 02/14/2014  . Benign paroxysmal positional nystagmus 02/14/2014  . 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 Bipolar 1 disorder ([place]) 08/14/2012  . Prediabetes 08/14/2012  . Adult hypothyroidism 06/21/2012  . Bilateral polycystic ovarian syndrome 06/21/2012    Prior to Admission medications   Medication Sig Start Date End Date Taking? Authorizing Provider  albuterol (PROVENTIL HFA;VENTOLIN HFA) 108 (90 Base) MCG/ACT inhaler Inhale 2 puffs into the lungs every 4 (four) hours as needed for wheezing or shortness of breath. 07/15/15  Yes Frederich Cha, MD  Cholecalciferol (VITAMIN D) 2000 units CAPS Take 1  capsule (2,000 Units total) by mouth daily. 08/28/15  Yes Adline Potter, MD  hydrochlorothiazide (HYDRODIURIL) 25 MG tablet Take 1 tablet (25 mg total) by mouth daily. 10/27/15  Yes Adline Potter, MD  LamoTRIgine (LAMICTAL XR) 50 MG TB24 Take 1 tablet by mouth daily.   Yes Historical Provider, MD  levothyroxine (SYNTHROID, LEVOTHROID) 125 MCG tablet Take 0.5 tablets (62.5 mcg total) by mouth 2 (two) times daily. 11/24/15  Yes Adline Potter, MD  losartan (COZAAR) 50 MG tablet Take 50 mg by mouth 2 (two) times daily.    Yes Historical Provider, MD  Melatonin 5 MG CAPS Take 15 mg by mouth Nightly.    Yes Historical Provider, MD  metFORMIN (GLUCOPHAGE) 500 MG tablet Take 500 mg by mouth 2 (two) times daily.   Yes Historical Provider, MD  Multiple Vitamin (MULTIVITAMIN) tablet Take 1 tablet by mouth daily.   Yes Historical Provider, MD  lamoTRIgine (LAMICTAL) 200 MG tablet Take 200 mg by mouth daily. Reported on 11/27/2015    Historical Provider, MD    Allergies  Allergen Reactions  . Lisinopril Cough  . Latex Rash    Condoms only    Past Surgical History  Procedure Laterality Date  . Abdominal hysterectomy    . Gallbladder surgery    . Dental surgery    . Colonoscopy with propofol N/A 06/19/2015    Procedure: COLONOSCOPY WITH PROPOFOL;  Surgeon: [name], MD;  Location: [facility];  Service: Endoscopy;  Laterality: N/A;  Diabetic - oral meds CPAP    Social History  Substance Use Topics  . Smoking status: Former Smoker    Types: Cigarettes    Quit date: 07/12/2002  .  Smokeless tobacco: Never Used     Comment: quit 2004  . Alcohol Use: No    Family History  Problem Relation Age of Onset  . Diabetes Maternal Grandmother   . Hypertension Maternal Grandmother   . Hyperlipidemia Maternal Grandmother   . Diabetes Mother   . Hypertension Mother   . Hypothyroidism Maternal Grandmother   . Hypothyroidism Mother   . Bipolar disorder Mother     Medication list has been  reviewed and updated.  Physical Examination: BP 128/87 mmHg  Pulse 82  Resp 16  Ht 5\' 6"  (1.676 m)  Wt 281 lb (127.461 kg)  BMI 45.38 kg/m2  SpO2 96%  Physical Exam  Constitutional: She appears well-developed and well-nourished.  Cardiovascular: Normal rate, regular rhythm and normal heart sounds.   Pulmonary/Chest: Effort normal and breath sounds normal.  Musculoskeletal: She exhibits no edema.  Neurological: She is alert.  Skin: Skin is warm and dry.  Psychiatric: She has a normal mood and affect. Her behavior is normal.  Nursing note and vitals reviewed.   Assessment and Plan:  1. Essential hypertension Improved control on HCTZ with losartan, consider combo pill next refill - Basic Metabolic Panel (BMET)  2. Internal hemorrhoids Discussed high fiber diet, consider suppositories if recurrent  3. OSA on CPAP S/p recent titration  4. Hypothyroidism, unspecified hypothyroidism type On increased Synthroid dose - TSH  5. Prediabetes Marginal control on metformin - HgB A1c  6. Vitamin D deficiency On supplement - Vitamin D (25 hydroxy)  7. Bipolar 1 disorder (HCC) On Lamictal, psych following  8. Obesity, Class III, BMI 40-49.9 (morbid obesity) ([place]) Weight down 6#, continue exercise/weight loss  Return in about 3 months (around 02/27/2016).  Satira Anis. [facility]  11/27/2015

## 2015-11-28 ENCOUNTER — Other Ambulatory Visit: Payer: Self-pay | Admitting: Family Medicine

## 2015-11-28 LAB — BASIC METABOLIC PANEL
BUN / CREAT RATIO: 14 (ref 9–23)
BUN: 10 mg/dL (ref 6–24)
CO2: 19 mmol/L (ref 18–29)
CREATININE: 0.74 mg/dL (ref 0.57–1.00)
Calcium: 9.5 mg/dL (ref 8.7–10.2)
Chloride: 96 mmol/L (ref 96–106)
GFR calc Af Amer: 112 mL/min/{1.73_m2} (ref >59)
GFR calc non Af Amer: 97 mL/min/{1.73_m2} (ref >59)
GLUCOSE: 100 mg/dL — AB (ref 65–99)
Potassium: 3.9 mmol/L (ref 3.5–5.2)
Sodium: 141 mmol/L (ref 134–144)

## 2015-11-28 LAB — HEMOGLOBIN A1C
Est. average glucose Bld gHb Est-mCnc: 146 mg/dL
Hgb A1c MFr Bld: 6.7 % — ABNORMAL HIGH (ref 4.8–5.6)

## 2015-11-28 LAB — VITAMIN D 25 HYDROXY (VIT D DEFICIENCY, FRACTURES): Vit D, 25-Hydroxy: 25.3 ng/mL — ABNORMAL LOW (ref 30.0–100.0)

## 2015-11-28 LAB — TSH: TSH: 5.5 u[IU]/mL — AB (ref 0.450–4.500)

## 2015-11-28 MED ORDER — METFORMIN HCL 1000 MG PO TABS: 1000.0000 mg | ORAL_TABLET | Freq: Two times a day (BID) | ORAL | Status: DC

## 2015-11-28 MED ORDER — CHOLECALCIFEROL 125 MCG (5000 UT) PO CAPS: 5000.0000 [IU] | ORAL_CAPSULE | Freq: Every day | ORAL | Status: AC

## 2015-11-28 MED ORDER — LEVOTHYROXINE SODIUM 150 MCG PO TABS: 150.0000 ug | ORAL_TABLET | Freq: Every day | ORAL | Status: DC

## 2015-12-01 ENCOUNTER — Other Ambulatory Visit: Payer: Self-pay | Admitting: Family Medicine

## 2015-12-01 DIAGNOSIS — E119 Type 2 diabetes mellitus without complications: Secondary | ICD-10-CM

## 2015-12-01 DIAGNOSIS — E1165 Type 2 diabetes mellitus with hyperglycemia: Secondary | ICD-10-CM | POA: Insufficient documentation

## 2015-12-01 DIAGNOSIS — E1169 Type 2 diabetes mellitus with other specified complication: Secondary | ICD-10-CM | POA: Insufficient documentation

## 2015-12-05 ENCOUNTER — Telehealth: Payer: Self-pay

## 2015-12-05 NOTE — Telephone Encounter (Signed)
Patient left message upset that she has been awaiting call back. I have not seen or heard any messages and lifestyles ref is ordered. I called and got VM.

## 2015-12-09 ENCOUNTER — Ambulatory Visit (INDEPENDENT_AMBULATORY_CARE_PROVIDER_SITE_OTHER): Payer: 59 | Admitting: Family Medicine

## 2015-12-09 ENCOUNTER — Encounter: Payer: Self-pay | Admitting: Family Medicine

## 2015-12-09 VITALS — BP 128/82 | HR 96 | Temp 99.5°F | Resp 16 | Ht 66.0 in | Wt 284.0 lb

## 2015-12-09 DIAGNOSIS — F319 Bipolar disorder, unspecified: Secondary | ICD-10-CM | POA: Diagnosis not present

## 2015-12-09 DIAGNOSIS — E119 Type 2 diabetes mellitus without complications: Secondary | ICD-10-CM | POA: Diagnosis not present

## 2015-12-09 DIAGNOSIS — Z9989 Dependence on other enabling machines and devices: Secondary | ICD-10-CM

## 2015-12-09 DIAGNOSIS — E559 Vitamin D deficiency, unspecified: Secondary | ICD-10-CM | POA: Diagnosis not present

## 2015-12-09 DIAGNOSIS — G4733 Obstructive sleep apnea (adult) (pediatric): Secondary | ICD-10-CM

## 2015-12-09 DIAGNOSIS — I10 Essential (primary) hypertension: Secondary | ICD-10-CM

## 2015-12-09 DIAGNOSIS — E039 Hypothyroidism, unspecified: Secondary | ICD-10-CM | POA: Diagnosis not present

## 2015-12-09 MED ORDER — METFORMIN HCL 500 MG PO TABS: 500.0000 mg | ORAL_TABLET | Freq: Two times a day (BID) | ORAL | Status: DC

## 2015-12-09 MED ORDER — SITAGLIPTIN PHOSPHATE 100 MG PO TABS
100.0000 mg | ORAL_TABLET | Freq: Every day | ORAL | Status: DC
Start: 2015-12-09 — End: 2016-03-03

## 2015-12-10 NOTE — Progress Notes (Signed)
Date:  12/09/2015   Name:  Lori Knight   DOB:  July 25, 1970   MRN:  [id number]  PCP:  Adline Potter, MD    Chief Complaint: Medication Problem   History of Present Illness:  This is a 45 y.o. female seen in two week f/u, has appt with Dr. Army Melia on 12/28/15. Synthroid and vit D doses increased after last visit, tolerating well. Blood work showed prediabetes now in diabetic range, metformin increased to 1000 mg bid but not tolerating due to persistent diarrhea, was ok on 500 mg bid, concerned about meds causing weight gain. Psych increased Lamictal dose, seems to be helping. New CPAP machine working well. Diabetic education referral sent but has not heard back from office.  Review of Systems:  Review of Systems  Constitutional: Negative for fever and fatigue.  Respiratory: Negative for cough and shortness of breath.   Cardiovascular: Negative for chest pain and leg swelling.  Endocrine: Negative for polyuria.  Genitourinary: Negative for difficulty urinating.  Neurological: Negative for syncope and light-headedness.    Patient Active Problem List   Diagnosis Date Noted  . Diabetes mellitus type 2, controlled, without complications ([place]) XX123456  . OSA on CPAP 09/22/2015  . Intermittent palpitations 08/28/2015  . GERD (gastroesophageal reflux disease) 08/28/2015  . Insomnia 08/28/2015  . Internal hemorrhoids 08/28/2015  . Vitamin D deficiency 08/28/2015  . Obesity, Class III, BMI 40-49.9 (morbid obesity) ([place]) 08/27/2015  . Hypertension 02/14/2014  . Benign paroxysmal positional nystagmus 02/14/2014  . 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 Bipolar 1 disorder ([place]) 08/14/2012  . Adult hypothyroidism 06/21/2012  . Bilateral polycystic ovarian syndrome 06/21/2012    Prior to Admission medications   Medication Sig Start Date End Date Taking? Authorizing Provider  albuterol (PROVENTIL HFA;VENTOLIN HFA) 108 (90 Base) MCG/ACT inhaler Inhale 2 puffs into the lungs every 4  (four) hours as needed for wheezing or shortness of breath. 07/15/15   Frederich Cha, MD  Cholecalciferol 5000 units capsule Take 1 capsule (5,000 Units total) by mouth daily. 11/28/15   Adline Potter, MD  hydrochlorothiazide (HYDRODIURIL) 25 MG tablet Take 1 tablet (25 mg total) by mouth daily. 10/27/15   Adline Potter, MD  lamoTRIgine (LAMICTAL) 100 MG tablet  11/28/15   Historical Provider, MD  levothyroxine (SYNTHROID, LEVOTHROID) 150 MCG tablet Take 1 tablet (150 mcg total) by mouth daily. 11/28/15   Adline Potter, MD  losartan (COZAAR) 50 MG tablet Take 50 mg by mouth 2 (two) times daily.     Historical Provider, MD  Melatonin 5 MG CAPS Take 15 mg by mouth Nightly.     Historical Provider, MD  metFORMIN (GLUCOPHAGE) 500 MG tablet Take 1 tablet (500 mg total) by mouth 2 (two) times daily with a meal. 12/09/15   Adline Potter, MD  Multiple Vitamin (MULTIVITAMIN) tablet Take 1 tablet by mouth daily.    Historical Provider, MD  sitaGLIPtin (JANUVIA) 100 MG tablet Take 1 tablet (100 mg total) by mouth daily. 12/09/15   Adline Potter, MD    Allergies  Allergen Reactions  . Lisinopril Cough  . Latex Rash    Condoms only    Past Surgical History  Procedure Laterality Date  . Abdominal hysterectomy    . Gallbladder surgery    . Dental surgery    . Colonoscopy with propofol N/A 06/19/2015    Procedure: COLONOSCOPY WITH PROPOFOL;  Surgeon: [name], MD;  Location: [facility];  Service: Endoscopy;  Laterality: N/A;  Diabetic - oral meds CPAP  Social History  Substance Use Topics  . Smoking status: Former Smoker    Types: Cigarettes    Quit date: 07/12/2002  . Smokeless tobacco: Never Used     Comment: quit 2004  . Alcohol Use: No    Family History  Problem Relation Age of Onset  . Diabetes Maternal Grandmother   . Hypertension Maternal Grandmother   . Hyperlipidemia Maternal Grandmother   . Diabetes Mother   . Hypertension Mother   . Hypothyroidism Maternal Grandmother   .  Hypothyroidism Mother   . Bipolar disorder Mother     Medication list has been reviewed and updated.  Physical Examination: BP 128/82 mmHg  Pulse 96  Temp(Src) 99.5 F (37.5 C) (Oral)  Resp 16  Ht 5\' 6"  (1.676 m)  Wt 284 lb (128.822 kg)  BMI 45.86 kg/m2  SpO2 97%  Physical Exam  Constitutional: She appears well-developed and well-nourished.  Cardiovascular: Normal rate, regular rhythm and normal heart sounds.   Pulmonary/Chest: Effort normal and breath sounds normal.  Musculoskeletal: She exhibits no edema.  Neurological: She is alert.  Skin: Skin is warm and dry.  Psychiatric: She has a normal mood and affect. Her behavior is normal.  Nursing note and vitals reviewed.   Assessment and Plan:  1. Controlled type 2 diabetes mellitus without complication, without long-term current use of insulin (Lori Knight) New onset, recommend decrease metformin to 500 mg bid and add Januvia 100 mg daily, diabetic education eval pending  2. Essential hypertension Well controlled on losartan/HCTZ, consider d/c HCTZ next visit as may be worsening diabetic control  3. Hypothyroidism, unspecified hypothyroidism type On increase Synthroid dose, consider repeat TSH next visit  4. Vitamin D deficiency On increased supplement, consider repeat level next visit  5. Bipolar 1 disorder (HCC) Improved on increased Lamictal per psych  6. OSA on CPAP Improved with new CPAP machine  7. Obesity, Class III, BMI 40-49.9 (morbid obesity) (Lori Knight) Weight up 3#, discuss with diabetic educator  Return in about 3 weeks (around 12/30/2015).  Satira Anis. [facility]  12/10/2015

## 2015-12-19 ENCOUNTER — Encounter: Payer: 59 | Attending: Family Medicine | Admitting: *Deleted

## 2015-12-19 ENCOUNTER — Encounter: Payer: Self-pay | Admitting: *Deleted

## 2015-12-19 VITALS — BP 128/82 | Ht 66.0 in | Wt 281.5 lb

## 2015-12-19 DIAGNOSIS — E119 Type 2 diabetes mellitus without complications: Secondary | ICD-10-CM | POA: Diagnosis not present

## 2015-12-19 NOTE — Progress Notes (Signed)
Eat a Diabetes Self-Management Education  Visit Type: First/Initial  Appt. Start Time: 0905 Appt. End Time: 1010  12/19/2015  [name], identified by name and date of birth, is a 45 y.o. female with a diagnosis of Diabetes: Type 2.   ASSESSMENT  Blood pressure 128/82, height '5\' 6"'  (1.676 m), weight 281 lb 8 oz (127.688 kg). Body mass index is 45.46 kg/(m^2).      Diabetes Self-Management Education - 12/19/15 1237    Visit Information   Visit Type First/Initial   Initial Visit   Diabetes Type Type 2   Are you currently following a meal plan? No   Are you taking your medications as prescribed? Yes   Date Diagnosed 1 month   Health Coping   How would you rate your overall health? Poor   Psychosocial Assessment   Patient Belief/Attitude about Diabetes Motivated to manage diabetes   Self-care barriers Other (comment)  lack of money to attend diabetes classes   Self-management support Doctor's office;Friends;Family   Patient Concerns Nutrition/Meal planning;Glycemic Control;Problem Solving;Weight Control   Special Needs None   Preferred Learning Style Visual;Auditory   Learning Readiness Change in progress   How often do you need to have someone help you when you read instructions, pamphlets, or other written materials from your doctor or pharmacy? 1 - Never   What is the last grade level you completed in school? 11th   Pre-Education Assessment   Patient understands the diabetes disease and treatment process. Needs Instruction   Patient understands incorporating nutritional management into lifestyle. Needs Instruction   Patient undertands incorporating physical activity into lifestyle. Needs Instruction   Patient understands using medications safely. Needs Instruction   Patient understands monitoring blood glucose, interpreting and using results Needs Instruction   Patient understands prevention, detection, and treatment of acute complications. Needs Instruction   Patient understands prevention, detection, and treatment of chronic complications. Needs Instruction   Patient understands how to develop strategies to address psychosocial issues. Needs Instruction   Patient understands how to develop strategies to promote health/change behavior. Needs Instruction   Complications   Last HgB A1C per patient/outside source 6.7 %  11/27/15   How often do you check your blood sugar? Not recommended by provider  She has a meter but was told she didn't need to test; recommended she test 3 x week - fasting and 2 hrs after a meal   Have you had a dilated eye exam in the past 12 months? Yes   Have you had a dental exam in the past 12 months? No   Are you checking your feet? No   Dietary Intake   Breakfast egg, sausage, cheese   Snack (morning) red peppers, string cheese, meat and cheese stick   Lunch [employer]; chicken legs; [employer]   Snack (afternoon) red peppers, string cheese, meat and cheese stick   Dinner chicken, brocolli, cauliflower   Beverage(s) water, unsweetened tea   Exercise   Exercise Type Light (walking / raking leaves)  CURVES   How many days per week to you exercise? 1   How many minutes per day do you exercise? 30   Total minutes per week of exercise 30   Patient Education   Previous Diabetes Education No   Disease state  Definition of diabetes, type 1 and 2, and the diagnosis of diabetes;Factors that contribute to the development of diabetes   Nutrition management  Role of diet in the treatment of diabetes and the relationship between the three  main macronutrients and blood glucose level;Carbohydrate counting   Physical activity and exercise  Role of exercise on diabetes management, blood pressure control and cardiac health.   Medications Reviewed patients medication for diabetes, action, purpose, timing of dose and side effects.   Monitoring Purpose and frequency of SMBG.;Identified appropriate SMBG and/or A1C goals.   Chronic  complications Relationship between chronic complications and blood glucose control   Psychosocial adjustment Identified and addressed patients feelings and concerns about diabetes   Individualized Goals (developed by patient)   Reducing Risk Other (comment)   Outcomes   Expected Outcomes Demonstrated interest in learning. Expect positive outcomes   Program Status Not Completed  Pt is unable to afford any further visits at this time.      Individualized Plan for Diabetes Self-Management Training:   Learning Objective:  Patient will have a greater understanding of diabetes self-management. Patient education plan is to attend individual and/or group sessions per assessed needs and concerns.   Plan:   Patient Instructions  Check blood sugars 2 x day before breakfast or 2 hrs after supper 3 x week Exercise: Continue CURVES   for   30  minutes  2-3 days a week and gradually increase to 150 minutes/week Eat 3 meals day, 2 snacks a day Space meals 4-6 hours apart Eat a snack between meals if needed with 1 protein and 1 carbohydrate serving Call back if you want to schedule classes or 1:1 appointment with the dietitian   Expected Outcomes:  Demonstrated interest in learning. Expect positive outcomes  Education material provided:  General Meal Planning Guidelines Simple Meal Plan Carb Counting and Meal Planning  If problems or questions, patient to contact team via:  Lori Knight, [place], CCM, CDE [phone]  Future DSME appointment:  Since she hasn't met her deductible, she is not able to afford diabetes education at this time. Patient reports she has other medical bills to pay as well. She was offered classes or an additional appointment with the dietitian.

## 2015-12-19 NOTE — Patient Instructions (Addendum)
Check blood sugars 2 x day before breakfast or 2 hrs after supper 3 x week Exercise: Continue CURVES   for   30  minutes  2-3 days a week and gradually increase to 150 minutes/week Eat 3 meals day, 2 snacks a day Space meals 4-6 hours apart Eat a snack between meals if needed with 1 protein and 1 carbohydrate serving Call back if you want to schedule classes or 1:1 appointment with the dietitian

## 2015-12-29 ENCOUNTER — Encounter: Payer: Self-pay | Admitting: Internal Medicine

## 2015-12-29 ENCOUNTER — Ambulatory Visit (INDEPENDENT_AMBULATORY_CARE_PROVIDER_SITE_OTHER): Payer: 59 | Admitting: Internal Medicine

## 2015-12-29 VITALS — BP 128/84 | HR 89 | Resp 16 | Ht 66.0 in | Wt 281.0 lb

## 2015-12-29 DIAGNOSIS — E038 Other specified hypothyroidism: Secondary | ICD-10-CM

## 2015-12-29 DIAGNOSIS — G4733 Obstructive sleep apnea (adult) (pediatric): Secondary | ICD-10-CM | POA: Diagnosis not present

## 2015-12-29 DIAGNOSIS — Z1239 Encounter for other screening for malignant neoplasm of breast: Secondary | ICD-10-CM | POA: Diagnosis not present

## 2015-12-29 DIAGNOSIS — E034 Atrophy of thyroid (acquired): Secondary | ICD-10-CM

## 2015-12-29 DIAGNOSIS — Z9989 Dependence on other enabling machines and devices: Secondary | ICD-10-CM

## 2015-12-29 DIAGNOSIS — I10 Essential (primary) hypertension: Secondary | ICD-10-CM | POA: Diagnosis not present

## 2015-12-29 DIAGNOSIS — E119 Type 2 diabetes mellitus without complications: Secondary | ICD-10-CM | POA: Diagnosis not present

## 2015-12-29 NOTE — Progress Notes (Signed)
Date:  12/29/2015   Name:  Lori Knight   DOB:  1971/04/13   MRN:  [id number]  Patient of Dr. Vicente Masson.  Here to establish and follow up.  Chief Complaint: Establish Care; Hypothyroidism; and Diabetes Diabetes She presents for her follow-up diabetic visit. She has type 2 diabetes mellitus. Her disease course has been improving. Hypoglycemia symptoms include nervousness/anxiousness. Pertinent negatives for hypoglycemia include no dizziness or headaches. Pertinent negatives for diabetes include no chest pain, no fatigue and no weakness. Her weight is stable. She has had a previous visit with a dietitian. Frequency home blood tests: not testing but has a meter. An ACE inhibitor/angiotensin II receptor blocker is being taken.  Thyroid Problem Presents for follow-up visit. Symptoms include anxiety and weight gain. Patient reports no constipation, diarrhea, fatigue, hair loss, hoarse voice, leg swelling or palpitations. The symptoms have been improving. Past treatments include levothyroxine (dose changed recently to 150 mcg ).  Hypertension This is a chronic problem. The current episode started more than 1 year ago. The problem is unchanged. The problem is controlled. Pertinent negatives include no chest pain, headaches, palpitations or shortness of breath. Past treatments include angiotensin blockers and diuretics. Hypertensive end-organ damage includes a thyroid problem.  Bipolar with anxiety - lamictal recently increased to 300 mg.  Feels better emotionally but now having intermittent random itching over various areas without rash.  She will follow up with Psychiatry. OSA - recently resumed using CPAP. She tolerates it for several hours then wakes up with a dry cough.  She is not using the humidifier yet.  Review of Systems  Constitutional: Positive for weight gain. Negative for fever, chills, fatigue and unexpected weight change.  HENT: Negative for hoarse voice.   Eyes: Negative for visual  disturbance.  Respiratory: Negative for choking, chest tightness, shortness of breath and wheezing.   Cardiovascular: Negative for chest pain, palpitations and leg swelling.  Gastrointestinal: Negative for vomiting, abdominal pain, diarrhea and constipation.  Genitourinary: Negative for dysuria.  Musculoskeletal: Negative for arthralgias.  Skin: Negative for rash (but random itching).  Neurological: Negative for dizziness, syncope, weakness, numbness and headaches.  Psychiatric/Behavioral: Negative for sleep disturbance and dysphoric mood. The patient is nervous/anxious.     Patient Active Problem List   Diagnosis Date Noted  . Diabetes mellitus type 2, controlled, without complications ([place]) XX123456  . OSA on CPAP 09/22/2015  . Intermittent palpitations 08/28/2015  . GERD (gastroesophageal reflux disease) 08/28/2015  . Insomnia 08/28/2015  . Internal hemorrhoids 08/28/2015  . Vitamin D deficiency 08/28/2015  . Obesity, Class III, BMI 40-49.9 (morbid obesity) ([place]) 08/27/2015  . Hypertension 02/14/2014  . Benign paroxysmal positional nystagmus 02/14/2014  . 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 Bipolar 1 disorder ([place]) 08/14/2012  . Adult hypothyroidism 06/21/2012  . Bilateral polycystic ovarian syndrome 06/21/2012    Prior to Admission medications   Medication Sig Start Date End Date Taking? Authorizing Provider  albuterol (PROVENTIL HFA;VENTOLIN HFA) 108 (90 Base) MCG/ACT inhaler Inhale 2 puffs into the lungs every 4 (four) hours as needed for wheezing or shortness of breath. 07/15/15  Yes Frederich Cha, MD  Ascorbic Acid (VITAMIN C) 1000 MG tablet Take 1,000 mg by mouth daily.   Yes Historical Provider, MD  Cholecalciferol 5000 units capsule Take 1 capsule (5,000 Units total) by mouth daily. 11/28/15  Yes Adline Potter, MD  hydrochlorothiazide (HYDRODIURIL) 25 MG tablet Take 1 tablet (25 mg total) by mouth daily. 10/27/15  Yes Adline Potter, MD  lamoTRIgine (  LAMICTAL) 100 MG  tablet Take 300 mg by mouth daily.  11/28/15  Yes Historical Provider, MD  lamoTRIgine (LAMICTAL) 200 MG tablet  12/20/15  Yes Historical Provider, MD  levothyroxine (SYNTHROID, LEVOTHROID) 150 MCG tablet Take 1 tablet (150 mcg total) by mouth daily. 11/28/15  Yes Adline Potter, MD  losartan (COZAAR) 50 MG tablet Take 50 mg by mouth 2 (two) times daily.    Yes Historical Provider, MD  Melatonin 5 MG CAPS Take 15 mg by mouth Nightly.    Yes Historical Provider, MD  metFORMIN (GLUCOPHAGE) 500 MG tablet Take 1 tablet (500 mg total) by mouth 2 (two) times daily with a meal. 12/09/15  Yes Adline Potter, MD  Multiple Vitamin (MULTIVITAMIN) tablet Take 1 tablet by mouth daily.   Yes Historical Provider, MD  sitaGLIPtin (JANUVIA) 100 MG tablet Take 1 tablet (100 mg total) by mouth daily. 12/09/15  Yes Adline Potter, MD    Allergies  Allergen Reactions  . Lisinopril Cough  . Latex Rash    Condoms only    Past Surgical History  Procedure Laterality Date  . Abdominal hysterectomy    . Gallbladder surgery    . Dental surgery    . Colonoscopy with propofol N/A 06/19/2015    Procedure: COLONOSCOPY WITH PROPOFOL;  Surgeon: [name], MD;  Location: [facility];  Service: Endoscopy;  Laterality: N/A;  Diabetic - oral meds CPAP    Social History  Substance Use Topics  . Smoking status: Former Smoker -- 1.00 packs/day for 10 years    Types: Cigarettes    Quit date: 07/12/2002  . Smokeless tobacco: Never Used     Comment: quit 2004  . Alcohol Use: 0.0 oz/week    0 Standard drinks or equivalent per week     Comment: rarely    Medication list has been reviewed and updated.   Physical Exam  Constitutional: She appears well-developed and well-nourished.  Neck: Normal range of motion. Neck supple. No thyromegaly present.  Cardiovascular: Normal rate, regular rhythm and normal heart sounds.   Pulmonary/Chest: Effort normal and breath sounds normal.  Musculoskeletal: She exhibits no edema or  tenderness.  Lymphadenopathy:    She has no cervical adenopathy.  Neurological: She is alert.  Skin: Skin is warm and dry. No rash noted. No erythema.  Psychiatric: She has a normal mood and affect. Her behavior is normal.  Nursing note and vitals reviewed.   BP 128/84 mmHg  Pulse 89  Resp 16  Ht 5\' 6"  (1.676 m)  Wt 281 lb (127.461 kg)  BMI 45.38 kg/m2  SpO2 96%  Assessment and Plan: 1. Hypothyroidism due to acquired atrophy of thyroid Dose recently changed - will check next visit  2. Controlled type 2 diabetes mellitus without complication, without long-term current use of insulin (HCC) Now on metformin and Januvia Recommend checking BS 2-3 times per week Check A1c next visit  3. Essential hypertension controlled  4. OSA on CPAP Fair compliance - waking early; possibly due to dry air  Discussed using humidifier nightly  5. Breast cancer screening Order for mammogram entered Pt will check on cost Continue self exams   Halina Maidens, MD [facility]  12/29/2015

## 2015-12-29 NOTE — Patient Instructions (Signed)
Breast Self-Awareness Practicing breast self-awareness may pick up problems early, prevent significant medical complications, and possibly save your life. By practicing breast self-awareness, you can become familiar with how your breasts look and feel and if your breasts are changing. This allows you to notice changes early. It can also offer you some reassurance that your breast health is good. One way to learn what is normal for your breasts and whether your breasts are changing is to do a breast self-exam. If you find a lump or something that was not present in the past, it is best to contact your caregiver right away. Other findings that should be evaluated by your caregiver include nipple discharge, especially if it is bloody; skin changes or reddening; areas where the skin seems to be pulled in (retracted); or new lumps and bumps. Breast pain is seldom associated with cancer (malignancy), but should also be evaluated by a caregiver. HOW TO PERFORM A BREAST SELF-EXAM The best time to examine your breasts is 5-7 days after your menstrual period is over. During menstruation, the breasts are lumpier, and it may be more difficult to pick up changes. If you do not menstruate, have reached menopause, or had your uterus removed (hysterectomy), you should examine your breasts at regular intervals, such as monthly. If you are breastfeeding, examine your breasts after a feeding or after using a breast pump. Breast implants do not decrease the risk for lumps or tumors, so continue to perform breast self-exams as recommended. Talk to your caregiver about how to determine the difference between the implant and breast tissue. Also, talk about the amount of pressure you should use during the exam. Over time, you will become more familiar with the variations of your breasts and more comfortable with the exam. A breast self-exam requires you to remove all your clothes above the waist. 1. Look at your breasts and nipples.  Stand in front of a mirror in a room with good lighting. With your hands on your hips, push your hands firmly downward. Look for a difference in shape, contour, and size from one breast to the other (asymmetry). Asymmetry includes puckers, dips, or bumps. Also, look for skin changes, such as reddened or scaly areas on the breasts. Look for nipple changes, such as discharge, dimpling, repositioning, or redness. 2. Carefully feel your breasts. This is best done either in the shower or tub while using soapy water or when flat on your back. Place the arm (on the side of the breast you are examining) above your head. Use the pads (not the fingertips) of your three middle fingers on your opposite hand to feel your breasts. Start in the underarm area and use  inch (2 cm) overlapping circles to feel your breast. Use 3 different levels of pressure (light, medium, and firm pressure) at each circle before moving to the next circle. The light pressure is needed to feel the tissue closest to the skin. The medium pressure will help to feel breast tissue a little deeper, while the firm pressure is needed to feel the tissue close to the ribs. Continue the overlapping circles, moving downward over the breast until you feel your ribs below your breast. Then, move one finger-width towards the center of the body. Continue to use the  inch (2 cm) overlapping circles to feel your breast as you move slowly up toward the collar bone (clavicle) near the base of the neck. Continue the up and down exam using all 3 pressures until you reach the   middle of the chest. Do this with each breast, carefully feeling for lumps or changes. 3.  Keep a written record with breast changes or normal findings for each breast. By writing this information down, you do not need to depend only on memory for size, tenderness, or location. Write down where you are in your menstrual cycle, if you are still menstruating. Breast tissue can have some lumps or  thick tissue. However, see your caregiver if you find anything that concerns you.  SEEK MEDICAL CARE IF:  You see a change in shape, contour, or size of your breasts or nipples.   You see skin changes, such as reddened or scaly areas on the breasts or nipples.   You have an unusual discharge from your nipples.   You feel a new lump or unusually thick areas.    This information is not intended to replace advice given to you by your health care provider. Make sure you discuss any questions you have with your health care provider.   Document Released: 06/28/2005 Document Revised: 06/14/2012 Document Reviewed: 10/13/2011 Elsevier Interactive Patient Education 2016 [employer]  

## 2016-01-19 ENCOUNTER — Other Ambulatory Visit: Payer: Self-pay | Admitting: Internal Medicine

## 2016-01-19 MED ORDER — HYDROCHLOROTHIAZIDE 25 MG PO TABS: 25.0000 mg | ORAL_TABLET | Freq: Every day | ORAL | Status: DC

## 2016-02-04 ENCOUNTER — Other Ambulatory Visit: Payer: Self-pay

## 2016-02-13 ENCOUNTER — Other Ambulatory Visit: Payer: Self-pay

## 2016-02-13 MED ORDER — LEVOTHYROXINE SODIUM 150 MCG PO TABS: 150.0000 ug | ORAL_TABLET | Freq: Every day | ORAL | 2 refills | Status: DC

## 2016-03-03 ENCOUNTER — Ambulatory Visit (INDEPENDENT_AMBULATORY_CARE_PROVIDER_SITE_OTHER): Payer: 59 | Admitting: Internal Medicine

## 2016-03-03 ENCOUNTER — Encounter: Payer: Self-pay | Admitting: Internal Medicine

## 2016-03-03 VITALS — BP 132/88 | HR 101 | Resp 16 | Ht 66.0 in | Wt 283.0 lb

## 2016-03-03 DIAGNOSIS — E038 Other specified hypothyroidism: Secondary | ICD-10-CM | POA: Diagnosis not present

## 2016-03-03 DIAGNOSIS — D2120 Benign neoplasm of connective and other soft tissue of unspecified lower limb, including hip: Secondary | ICD-10-CM | POA: Insufficient documentation

## 2016-03-03 DIAGNOSIS — G5691 Unspecified mononeuropathy of right upper limb: Secondary | ICD-10-CM | POA: Diagnosis not present

## 2016-03-03 DIAGNOSIS — E119 Type 2 diabetes mellitus without complications: Secondary | ICD-10-CM

## 2016-03-03 DIAGNOSIS — E034 Atrophy of thyroid (acquired): Secondary | ICD-10-CM

## 2016-03-03 DIAGNOSIS — D2121 Benign neoplasm of connective and other soft tissue of right lower limb, including hip: Secondary | ICD-10-CM

## 2016-03-03 DIAGNOSIS — M792 Neuralgia and neuritis, unspecified: Secondary | ICD-10-CM | POA: Insufficient documentation

## 2016-03-03 NOTE — Progress Notes (Signed)
Date:  03/03/2016   Name:  Lori Knight   DOB:  02-Sep-1970   MRN:  [id number]   Chief Complaint: Diabetes (having body aches from [place]) Diabetes  She presents for her follow-up diabetic visit. She has type 2 diabetes mellitus. Her disease course has been worsening. Pertinent negatives for hypoglycemia include no dizziness or headaches. Pertinent negatives for diabetes include no chest pain, no fatigue, no polydipsia and no polyuria. Her breakfast blood glucose is taken between 7-8 am. Her breakfast blood glucose range is generally 110-130 mg/dl.  Thyroid Problem  Presents for follow-up visit. Symptoms include weight gain. Patient reports no fatigue or palpitations. Symptom course: dose changed in May.   Lab Results  Component Value Date   HGBA1C 6.7 (H) 11/27/2015   Finger pain - For years she's had discomfort in the distal right fifth finger. There is no history of injury. When she touches the area since a sharp shooting pain all the way up her arm. Now that seems to be getting worse and is triggered by movement as well.  This has never been evaluated but now she thinks his symptoms are severe enough she would like to be seen.    Review of Systems  Constitutional: Positive for unexpected weight change and weight gain. Negative for chills, fatigue and fever.  Respiratory: Positive for shortness of breath. Negative for chest tightness and wheezing.   Cardiovascular: Negative for chest pain, palpitations and leg swelling.  Endocrine: Negative for polydipsia and polyuria.  Genitourinary: Negative for dysuria.  Musculoskeletal: Positive for myalgias. Negative for arthralgias.  Neurological: Negative for dizziness and headaches.  Psychiatric/Behavioral: Negative for sleep disturbance.    Patient Active Problem List   Diagnosis Date Noted  . Diabetes mellitus type 2, controlled, without complications ([place]) XX123456  . OSA on CPAP 09/22/2015  . Intermittent palpitations  08/28/2015  . GERD (gastroesophageal reflux disease) 08/28/2015  . Insomnia 08/28/2015  . Internal hemorrhoids 08/28/2015  . Vitamin D deficiency 08/28/2015  . Obesity, Class III, BMI 40-49.9 (morbid obesity) ([place]) 08/27/2015  . Hypertension 02/14/2014  . Benign paroxysmal positional nystagmus 02/14/2014  . 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 Bipolar 1 disorder ([place]) 08/14/2012  . Adult hypothyroidism 06/21/2012  . Bilateral polycystic ovarian syndrome 06/21/2012    Prior to Admission medications   Medication Sig Start Date End Date Taking? Authorizing Provider  albuterol (PROVENTIL HFA;VENTOLIN HFA) 108 (90 Base) MCG/ACT inhaler Inhale 2 puffs into the lungs every 4 (four) hours as needed for wheezing or shortness of breath. 07/15/15  Yes Frederich Cha, MD  Ascorbic Acid (VITAMIN C) 1000 MG tablet Take 1,000 mg by mouth daily.   Yes Historical Provider, MD  Cholecalciferol 5000 units capsule Take 1 capsule (5,000 Units total) by mouth daily. 11/28/15  Yes Adline Potter, MD  fluticasone Asencion Islam) 50 MCG/ACT nasal spray  02/11/16  Yes Historical Provider, MD  hydrochlorothiazide (HYDRODIURIL) 25 MG tablet Take 1 tablet (25 mg total) by mouth daily. 01/19/16  Yes [name], MD  lamoTRIgine (LAMICTAL) 100 MG tablet Take 300 mg by mouth daily.  11/28/15  Yes Historical Provider, MD  lamoTRIgine (LAMICTAL) 200 MG tablet  12/20/15  Yes Historical Provider, MD  levothyroxine (SYNTHROID, LEVOTHROID) 150 MCG tablet Take 1 tablet (150 mcg total) by mouth daily. 02/13/16  Yes [name], MD  losartan (COZAAR) 50 MG tablet Take 50 mg by mouth 2 (two) times daily.    Yes Historical Provider, MD  Melatonin 5 MG CAPS  Take 15 mg by mouth Nightly.    Yes Historical Provider, MD  metFORMIN (GLUCOPHAGE) 500 MG tablet Take 1 tablet (500 mg total) by mouth 2 (two) times daily with a meal. 12/09/15  Yes Adline Potter, MD  Multiple Vitamin (MULTIVITAMIN) tablet Take 1 tablet by mouth daily.   Yes Historical  Provider, MD  sitaGLIPtin (JANUVIA) 100 MG tablet Take 1 tablet (100 mg total) by mouth daily. 12/09/15  Yes Adline Potter, MD    Allergies  Allergen Reactions  . Lisinopril Cough  . Latex Rash    Condoms only    Past Surgical History:  Procedure Laterality Date  . ABDOMINAL HYSTERECTOMY  2012   cervical dysplasia/ovaries remian  . COLONOSCOPY WITH PROPOFOL N/A 06/19/2015   Procedure: COLONOSCOPY WITH PROPOFOL;  Surgeon: [name], MD;  Location: [facility];  Service: Endoscopy;  Laterality: N/A;  Diabetic - oral meds   . DENTAL SURGERY    . GALLBLADDER SURGERY      Social History  Substance Use Topics  . Smoking status: Former Smoker    Packs/day: 1.00    Years: 10.00    Types: Cigarettes    Quit date: 07/12/2002  . Smokeless tobacco: Never Used     Comment: quit 2004  . Alcohol use 0.0 oz/week     Comment: rarely     Medication list has been reviewed and updated.   Physical Exam  Constitutional: She is oriented to person, place, and time. She appears well-developed. No distress.  HENT:  Head: Normocephalic and atraumatic.  Neck: Normal range of motion. No thyromegaly present.  Cardiovascular: Normal rate, regular rhythm and normal heart sounds.   Pulmonary/Chest: Effort normal and breath sounds normal. No respiratory distress.  Musculoskeletal: She exhibits no edema or tenderness.  Very painful distal right 5th finger - no deformity noted.  Neurological: She is alert and oriented to person, place, and time.  Skin: Skin is warm and dry. No rash noted.     Psychiatric: Her speech is normal and behavior is normal. Thought content normal. Her mood appears anxious.  Nursing note and vitals reviewed.   BP 132/88   Pulse (!) 101   Resp 16   Ht 5\' 6"  (1.676 m)   Wt 283 lb (128.4 kg)   SpO2 98%   BMI 45.68 kg/m   Assessment and Plan: 1. Hypothyroidism due to acquired atrophy of thyroid Check labs - TSH  2. Controlled type 2 diabetes mellitus  without complication, without long-term current use of insulin (HCC) Consider LifeStyle classes Stop Januvia Report sx response in 3-4 weeks - Hemoglobin A1c  3. Neuropathic pain of finger, right - Ambulatory referral to [place], MD [facility]  03/03/2016

## 2016-03-04 LAB — TSH: TSH: 12.98 u[IU]/mL — AB (ref 0.450–4.500)

## 2016-03-04 LAB — HEMOGLOBIN A1C
Est. average glucose Bld gHb Est-mCnc: 131 mg/dL
Hgb A1c MFr Bld: 6.2 % — ABNORMAL HIGH (ref 4.8–5.6)

## 2016-03-05 ENCOUNTER — Encounter: Payer: Self-pay | Admitting: Internal Medicine

## 2016-03-06 ENCOUNTER — Other Ambulatory Visit: Payer: Self-pay | Admitting: Internal Medicine

## 2016-03-06 MED ORDER — LEVOTHYROXINE SODIUM 175 MCG PO TABS: 175.0000 ug | ORAL_TABLET | Freq: Every day | ORAL | 5 refills | Status: DC

## 2016-03-18 ENCOUNTER — Ambulatory Visit: Payer: 59 | Admitting: Internal Medicine

## 2016-03-27 ENCOUNTER — Encounter: Payer: Self-pay | Admitting: Internal Medicine

## 2016-03-30 ENCOUNTER — Telehealth: Payer: Self-pay

## 2016-03-30 ENCOUNTER — Encounter: Payer: Self-pay | Admitting: Internal Medicine

## 2016-03-30 NOTE — Telephone Encounter (Signed)
Patient said she would like you to reach out to her via mychart and not a phone call back due to being in meetings most the day - she would like to go back onto Januvia - higher blood sugar is becoming debilitating. Patient just wants Dr. Gaspar Cola permission that it is okay to continue Januvia. Patient still has some of the medication at home. Durene Fruits has made blood sugar better.

## 2016-03-30 NOTE — Telephone Encounter (Signed)
Please advise 

## 2016-04-12 ENCOUNTER — Ambulatory Visit: Payer: Self-pay | Admitting: Internal Medicine

## 2016-04-12 ENCOUNTER — Other Ambulatory Visit: Payer: Self-pay | Admitting: Internal Medicine

## 2016-04-12 MED ORDER — SITAGLIPTIN PHOSPHATE 100 MG PO TABS: 100.0000 mg | ORAL_TABLET | Freq: Every day | ORAL | 2 refills | Status: DC

## 2016-04-20 ENCOUNTER — Ambulatory Visit (INDEPENDENT_AMBULATORY_CARE_PROVIDER_SITE_OTHER): Payer: 59 | Admitting: Internal Medicine

## 2016-04-20 ENCOUNTER — Encounter: Payer: Self-pay | Admitting: Internal Medicine

## 2016-04-20 ENCOUNTER — Other Ambulatory Visit: Payer: Self-pay | Admitting: Internal Medicine

## 2016-04-20 VITALS — BP 112/78 | HR 94 | Resp 16 | Ht 66.0 in | Wt 283.0 lb

## 2016-04-20 DIAGNOSIS — E119 Type 2 diabetes mellitus without complications: Secondary | ICD-10-CM

## 2016-04-20 DIAGNOSIS — F319 Bipolar disorder, unspecified: Secondary | ICD-10-CM

## 2016-04-20 DIAGNOSIS — E039 Hypothyroidism, unspecified: Secondary | ICD-10-CM | POA: Diagnosis not present

## 2016-04-20 DIAGNOSIS — I10 Essential (primary) hypertension: Secondary | ICD-10-CM

## 2016-04-20 NOTE — Progress Notes (Signed)
Date:  04/20/2016   Name:  Lori Knight   DOB:  1970-10-17   MRN:  [id number]   Chief Complaint: Headache (Wants to get CT for headache and other issues with head. ) and Manic Behavior (Feels she may have brain tumor that can cause some of her behaviors and headaches/. Has had two manic episodes and feels along with psych that it could be something else causing this all of a sudden as bipolar would have caused it long ago. )  HPI Patient comes in to discuss issues with psychiatric dx and not feeling well.  She has no energy and believes that she may have Sheehan's syndrome.  She is also concerned about the possibility of a brain tumor.  Review of chart shows extensive hormonal and pituitary testing at [facility] in 2013 and 2014 - all tests were normal.  She has a diagnosis of Bipolar disorder and has been treated with numerous medications over the past 8 years.  Her current psychiatrist has been seeing her for only a short period time.  Recently added an atypical antipsychotic.  Since then she has had 2 episodes of manic behavior lasting 2 days.  She did not enjoy the sensation and began to believe that something else was wrong - such as a brain tumor or pituitary abnormality.  She denies headache of any sort. She only said that to see if she could get at CT brain. She has no neurological symptoms.  She is now tolerating Januvia for DM and is on higher dose Levothyroxine - due for repeat labs in 6 weeks.  Review of Systems  Constitutional: Negative for chills and fatigue.  Eyes: Negative for visual disturbance.  Respiratory: Negative for chest tightness, shortness of breath and wheezing.   Cardiovascular: Negative for chest pain, palpitations and leg swelling.  Gastrointestinal: Negative for abdominal pain.  Musculoskeletal: Negative for arthralgias.  Skin: Negative for color change and rash.  Neurological: Negative for dizziness, tremors, seizures, syncope, weakness, numbness and  headaches.  Hematological: Negative for adenopathy.  Psychiatric/Behavioral: Negative for hallucinations and suicidal ideas. The patient is not hyperactive (other than 2 brief episodes).     Patient Active Problem List   Diagnosis Date Noted  . Fibroma of foot 03/03/2016  . Neuropathic pain of finger 03/03/2016  . Diabetes mellitus type 2, controlled, without complications ([place]) XX123456  . OSA on CPAP 09/22/2015  . Intermittent palpitations 08/28/2015  . GERD (gastroesophageal reflux disease) 08/28/2015  . Insomnia 08/28/2015  . Internal hemorrhoids 08/28/2015  . Vitamin D deficiency 08/28/2015  . Obesity, Class III, BMI 40-49.9 (morbid obesity) ([place]) 08/27/2015  . Hypertension 02/14/2014  . Benign paroxysmal positional nystagmus 02/14/2014  . 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 Bipolar 1 disorder ([place]) 08/14/2012  . Adult hypothyroidism 06/21/2012  . Bilateral polycystic ovarian syndrome 06/21/2012    Prior to Admission medications   Medication Sig Start Date End Date Taking? Authorizing Provider  albuterol (PROVENTIL HFA;VENTOLIN HFA) 108 (90 Base) MCG/ACT inhaler Inhale 2 puffs into the lungs every 4 (four) hours as needed for wheezing or shortness of breath. 07/15/15  Yes Frederich Cha, MD  Ascorbic Acid (VITAMIN C) 1000 MG tablet Take 1,000 mg by mouth daily.   Yes Historical Provider, MD  Cholecalciferol 5000 units capsule Take 1 capsule (5,000 Units total) by mouth daily. 11/28/15  Yes Adline Potter, MD  fluticasone Asencion Islam) 50 MCG/ACT nasal spray  02/11/16  Yes Historical Provider, MD  hydrochlorothiazide (HYDRODIURIL) 25 MG tablet  Take 1 tablet (25 mg total) by mouth daily. 01/19/16  Yes Reubin Milan, MD  lamoTRIgine (LAMICTAL) 100 MG tablet Take 300 mg by mouth daily.  11/28/15  Yes Historical Provider, MD  levothyroxine (SYNTHROID, LEVOTHROID) 175 MCG tablet Take 1 tablet (175 mcg total) by mouth daily. 03/06/16  Yes Reubin Milan, MD  losartan (COZAAR) 50 MG tablet  Take 50 mg by mouth 2 (two) times daily.    Yes Historical Provider, MD  Melatonin 5 MG CAPS Take 15 mg by mouth Nightly.    Yes Historical Provider, MD  metFORMIN (GLUCOPHAGE) 500 MG tablet Take 1 tablet (500 mg total) by mouth 2 (two) times daily with a meal. 12/09/15  Yes Schuyler Amor, MD  Multiple Vitamin (MULTIVITAMIN) tablet Take 1 tablet by mouth daily.   Yes Historical Provider, MD  REXULTI 1 MG TABS Take 1 tablet by mouth daily. 04/06/16  Yes Historical Provider, MD  sitaGLIPtin (JANUVIA) 100 MG tablet Take 1 tablet (100 mg total) by mouth daily. 04/12/16  Yes Reubin Milan, MD    Allergies  Allergen Reactions  . Lisinopril Cough  . Latex Rash    Condoms only    Past Surgical History:  Procedure Laterality Date  . ABDOMINAL HYSTERECTOMY  2012   cervical dysplasia/ovaries remian  . COLONOSCOPY WITH PROPOFOL N/A 06/19/2015   Procedure: COLONOSCOPY WITH PROPOFOL;  Surgeon: Midge Minium, MD;  [facility];  Service: Endoscopy;  Laterality: N/A;  Diabetic - oral meds   . DENTAL SURGERY    . GALLBLADDER SURGERY      Social History  Substance Use Topics  . Smoking status: Former Smoker    Packs/day: 1.00    Years: 10.00    Types: Cigarettes    Quit date: 07/12/2002  . Smokeless tobacco: Never Used     Comment: quit 2004  . Alcohol use 0.0 oz/week     Comment: rarely     Medication list has been reviewed and updated.   Physical Exam  Constitutional: She is oriented to person, place, and time. She appears well-developed. No distress.  HENT:  Head: Normocephalic and atraumatic.  Eyes: EOM are normal. Pupils are equal, round, and reactive to light.  Neck: Normal range of motion. Neck supple. Carotid bruit is not present. No thyromegaly present.  Cardiovascular: Normal rate, regular rhythm, normal heart sounds and normal pulses.   Pulmonary/Chest: Effort normal and breath sounds normal. No respiratory distress. She has no wheezes. She has no rales.    Musculoskeletal: Normal range of motion.  Neurological: She is alert and oriented to person, place, and time. She has normal strength and normal reflexes. No cranial nerve deficit or sensory deficit. She displays a negative Romberg sign. Coordination and gait normal.  Skin: Skin is warm and dry. No rash noted.  Psychiatric: She has a normal mood and affect. Her behavior is normal. Thought content normal.  Nursing note and vitals reviewed.   BP 112/78   Pulse 94   Resp 16   Ht  (1.676 m)   Wt 283 lb (128.4 kg)   SpO2 97%   BMI 45.68 kg/m   Assessment and Plan: 1. Essential hypertension controlled  2. Controlled type 2 diabetes mellitus without complication, without long-term current use of insulin (HCC) Continue metformin and Januvia Return in 6 weeks for labs  3. Adult hypothyroidism Supplemented No evidence of pituitary insufficiency with extensive workup in recent past  4. Bipolar 1 disorder (HCC) Recommend that she  discuss diagnosis with her psychiatrist tomorrow and inform of hypo-manic episodes since starting [place], MD [facility]  04/20/2016

## 2016-05-05 ENCOUNTER — Other Ambulatory Visit: Payer: Self-pay | Admitting: Orthopedic Surgery

## 2016-05-05 ENCOUNTER — Ambulatory Visit: Admission: RE | Admit: 2016-05-05 | Payer: 59 | Source: Ambulatory Visit

## 2016-05-05 ENCOUNTER — Other Ambulatory Visit (HOSPITAL_COMMUNITY): Payer: Self-pay | Admitting: Orthopedic Surgery

## 2016-05-05 DIAGNOSIS — M25541 Pain in joints of right hand: Secondary | ICD-10-CM

## 2016-05-10 ENCOUNTER — Other Ambulatory Visit: Payer: Self-pay | Admitting: Internal Medicine

## 2016-05-10 MED ORDER — SITAGLIPTIN PHOSPHATE 100 MG PO TABS: 100.0000 mg | ORAL_TABLET | Freq: Every day | ORAL | 1 refills | Status: DC

## 2016-05-10 MED ORDER — HYDROCHLOROTHIAZIDE 25 MG PO TABS: 25.0000 mg | ORAL_TABLET | Freq: Every day | ORAL | 1 refills | Status: DC

## 2016-05-13 ENCOUNTER — Ambulatory Visit
Admission: RE | Admit: 2016-05-13 | Discharge: 2016-05-13 | Disposition: A | Discharge status: Home / Self Care | Payer: 59 | Source: Ambulatory Visit | Attending: Internal Medicine | Admitting: Internal Medicine

## 2016-05-13 DIAGNOSIS — Z1231 Encounter for screening mammogram for malignant neoplasm of breast: Secondary | ICD-10-CM | POA: Diagnosis present

## 2016-05-13 DIAGNOSIS — Z1239 Encounter for other screening for malignant neoplasm of breast: Secondary | ICD-10-CM

## 2016-05-20 ENCOUNTER — Ambulatory Visit (HOSPITAL_COMMUNITY): Payer: 59

## 2016-05-20 ENCOUNTER — Ambulatory Visit: Admit: 2016-05-20 | Payer: 59

## 2016-05-20 SURGERY — RADIOLOGY WITH ANESTHESIA
Anesthesia: General | Laterality: Right

## 2016-05-26 ENCOUNTER — Ambulatory Visit
Admission: EM | Admit: 2016-05-26 | Discharge: 2016-05-26 | Disposition: A | Discharge status: Home / Self Care | Payer: 59 | Attending: Family Medicine | Admitting: Family Medicine

## 2016-05-26 DIAGNOSIS — J40 Bronchitis, not specified as acute or chronic: Secondary | ICD-10-CM

## 2016-05-26 DIAGNOSIS — R059 Cough, unspecified: Secondary | ICD-10-CM

## 2016-05-26 DIAGNOSIS — R05 Cough: Secondary | ICD-10-CM

## 2016-05-26 MED ORDER — ALBUTEROL SULFATE HFA 108 (90 BASE) MCG/ACT IN AERS: 1.0000 | INHALATION_SPRAY | Freq: Four times a day (QID) | RESPIRATORY_TRACT | 0 refills | Status: DC | PRN

## 2016-05-26 MED ORDER — GUAIFENESIN-CODEINE 100-10 MG/5ML PO SOLN: ORAL | 0 refills | Status: DC

## 2016-05-26 MED ORDER — AMOXICILLIN-POT CLAVULANATE 875-125 MG PO TABS: 1.0000 | ORAL_TABLET | Freq: Two times a day (BID) | ORAL | 0 refills | Status: DC

## 2016-05-26 MED ORDER — HYDROCOD POLST-CPM POLST ER 10-8 MG/5ML PO SUER: 5.0000 mL | Freq: Two times a day (BID) | ORAL | 0 refills | Status: DC | PRN

## 2016-05-26 NOTE — ED Provider Notes (Signed)
[facility]    CSN: [id number] Arrival date & time: 05/26/16  0836     History   Chief Complaint Chief Complaint  Patient presents with  . Cough    HPI Lori Knight is a 45 y.o. female.   The history is provided by the patient.  Cough  Associated symptoms: fever and wheezing   URI  Presenting symptoms: congestion, cough, fatigue and fever   Severity:  Moderate Onset quality:  Sudden Duration:  1 week Timing:  Constant Progression:  Worsening Chronicity:  New Relieved by:  Nothing Ineffective treatments:  OTC medications Associated symptoms: wheezing   Risk factors: diabetes mellitus and recent illness (viral uri)   Risk factors: not elderly, no chronic cardiac disease, no chronic kidney disease, no chronic respiratory disease, no immunosuppression, no recent travel and no sick contacts     Past Medical History:  Diagnosis Date  . Adult hypothyroidism 06/21/2012  . Bilateral polycystic ovarian syndrome 06/21/2012  . Biliary calculus with cholecystitis 08/14/2012  . Bipolar 1 disorder ([place])   . BP (high blood pressure) 02/14/2014  . Diabetes mellitus ([place]) 08/14/2012  . Diabetes mellitus without complication ([place])    pre-diabetic  . Dysrhythmia    wore heart monitor 2016. Corrected by changing Levothyroxine dose.  Marland Kitchen Heart murmur    followed by PCP  . Hyperprolactinemia ([place]) 06/21/2012  . Hypertension   . Motion sickness    carnival rides  . PONV (postoperative nausea and vomiting)   . Shortness of breath dyspnea    stairs. related to wt.  . Sleep apnea    has CPAP. has not used since 2011  . Vertigo    2x in last yr    Patient Active Problem List   Diagnosis Date Noted  . Fibroma of foot 03/03/2016  . Neuropathic pain of finger 03/03/2016  . Diabetes mellitus type 2, controlled, without complications ([place]) [id number]  . OSA on CPAP 09/22/2015  . Intermittent palpitations 08/28/2015  . GERD (gastroesophageal reflux disease) 08/28/2015    . Insomnia 08/28/2015  . Internal hemorrhoids 08/28/2015  . Vitamin D deficiency 08/28/2015  . Obesity, Class III, BMI 40-49.9 (morbid obesity) ([place]) 08/27/2015  . Hypertension 02/14/2014  . Benign paroxysmal positional nystagmus 02/14/2014  . 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 Bipolar 1 disorder ([place]) 08/14/2012  . Adult hypothyroidism 06/21/2012  . Bilateral polycystic ovarian syndrome 06/21/2012    Past Surgical History:  Procedure Laterality Date  . ABDOMINAL HYSTERECTOMY  2012   cervical dysplasia/ovaries remian  . COLONOSCOPY WITH PROPOFOL N/A 06/19/2015   Procedure: COLONOSCOPY WITH PROPOFOL;  Surgeon: [name], MD;  Location: [facility];  Service: Endoscopy;  Laterality: N/A;  Diabetic - oral meds   . DENTAL SURGERY    . GALLBLADDER SURGERY      OB History    No data available       Home Medications    Prior to Admission medications   Medication Sig Start Date End Date Taking? Authorizing Provider  Ascorbic Acid (VITAMIN C) 1000 MG tablet Take 1,000 mg by mouth daily.   Yes Historical Provider, MD  Cholecalciferol 5000 units capsule Take 1 capsule (5,000 Units total) by mouth daily. 11/28/15  Yes Adline Potter, MD  hydrochlorothiazide (HYDRODIURIL) 25 MG tablet Take 1 tablet (25 mg total) by mouth daily. 05/10/16  Yes [name], MD  lamoTRIgine (LAMICTAL) 100 MG tablet Take 300 mg by mouth daily.  11/28/15  Yes Historical Provider, MD  levothyroxine (  SYNTHROID, LEVOTHROID) 175 MCG tablet Take 1 tablet (175 mcg total) by mouth daily. 03/06/16  Yes [name], MD  losartan (COZAAR) 50 MG tablet Take 50 mg by mouth 2 (two) times daily.    Yes Historical Provider, MD  Melatonin 5 MG CAPS Take 15 mg by mouth Nightly.    Yes Historical Provider, MD  metFORMIN (GLUCOPHAGE) 500 MG tablet Take 1 tablet (500 mg total) by mouth 2 (two) times daily with a meal. 12/09/15  Yes Adline Potter, MD  Multiple Vitamin (MULTIVITAMIN) tablet Take 1 tablet by  mouth daily.   Yes Historical Provider, MD  Omega-3 Fatty Acids (FISH OIL) 1200 MG CAPS Take by mouth.   Yes Historical Provider, MD  REXULTI 1 MG TABS Take 1 tablet by mouth daily. 04/06/16  Yes Historical Provider, MD  sitaGLIPtin (JANUVIA) 100 MG tablet Take 1 tablet (100 mg total) by mouth daily. 05/10/16  Yes [name], MD  albuterol (PROVENTIL HFA;VENTOLIN HFA) 108 (90 Base) MCG/ACT inhaler Inhale 1-2 puffs into the lungs every 6 (six) hours as needed for wheezing or shortness of breath. 05/26/16   Norval Gable, MD  amoxicillin-clavulanate (AUGMENTIN) 875-125 MG tablet Take 1 tablet by mouth 2 (two) times daily. 05/26/16   Norval Gable, MD  fluticasone Asencion Islam) 50 MCG/ACT nasal spray  02/11/16   Historical Provider, MD  guaiFENesin-codeine 100-10 MG/5ML syrup 5 ml po qhs prn cough 05/26/16   Norval Gable, MD    Family History Family History  Problem Relation Age of Onset  . Diabetes Maternal Grandmother   . Hypertension Maternal Grandmother   . Hyperlipidemia Maternal Grandmother   . Hypothyroidism Maternal Grandmother   . Diabetes Mother   . Hypertension Mother   . Hypothyroidism Mother   . Bipolar disorder Mother   . Breast cancer Neg Hx     Social History Social History  Substance Use Topics  . Smoking status: Former Smoker    Packs/day: 1.00    Years: 10.00    Types: Cigarettes    Quit date: 07/12/2002  . Smokeless tobacco: Never Used     Comment: quit 2004  . Alcohol use 0.0 oz/week     Comment: rarely     Allergies   Lisinopril and Latex   Review of Systems Review of Systems  Constitutional: Positive for fatigue and fever.  HENT: Positive for congestion.   Respiratory: Positive for cough and wheezing.      Physical Exam Triage Vital Signs ED Triage Vitals  Enc Vitals Group     BP 05/26/16 0858 125/85     Pulse Rate 05/26/16 0858 86     Resp 05/26/16 0858 17     Temp 05/26/16 0858 99.1 F (37.3 C)     Temp Source 05/26/16 0858 Oral      SpO2 05/26/16 0858 95 %     Weight 05/26/16 0856 285 lb (129.3 kg)     Height 05/26/16 0856 5\' 6"  (1.676 m)     Head Circumference --      Peak Flow --      Pain Score 05/26/16 0857 3     Pain Loc --      Pain Edu? --      Excl. in [place]? --    No data found.   Updated Vital Signs BP 125/85 (BP Location: Left Arm)   Pulse 86   Temp 99.1 F (37.3 C) (Oral)   Resp 17   Ht 5\' 6"  (1.676 m)  Wt 285 lb (129.3 kg)   SpO2 95%   BMI 46.00 kg/m   Visual Acuity Right Eye Distance:   Left Eye Distance:   Bilateral Distance:    Right Eye Near:   Left Eye Near:    Bilateral Near:     Physical Exam  Constitutional: She appears well-developed and well-nourished. No distress.  HENT:  Head: Normocephalic and atraumatic.  Right Ear: Tympanic membrane, external ear and ear canal normal.  Left Ear: Tympanic membrane, external ear and ear canal normal.  Nose: No mucosal edema, rhinorrhea, nose lacerations, sinus tenderness, nasal deformity, septal deviation or nasal septal hematoma. No epistaxis.  No foreign bodies. Right sinus exhibits no maxillary sinus tenderness and no frontal sinus tenderness. Left sinus exhibits no maxillary sinus tenderness and no frontal sinus tenderness.  Mouth/Throat: Uvula is midline, oropharynx is clear and moist and mucous membranes are normal. No oropharyngeal exudate.  Eyes: Conjunctivae and EOM are normal. Pupils are equal, round, and reactive to light. Right eye exhibits no discharge. Left eye exhibits no discharge. No scleral icterus.  Neck: Normal range of motion. Neck supple. No thyromegaly present.  Cardiovascular: Normal rate, regular rhythm and normal heart sounds.   Pulmonary/Chest: Effort normal. No respiratory distress. She has wheezes (diffuse, mild, plus rhonchi). She has no rales.  Lymphadenopathy:    She has no cervical adenopathy.  Skin: She is not diaphoretic.  Nursing note and vitals reviewed.    UC Treatments / Results  Labs (all labs  ordered are listed, but only abnormal results are displayed) Labs Reviewed - No data to display  EKG  EKG Interpretation None       Radiology No results found.  Procedures Procedures (including critical care time)  Medications Ordered in UC Medications - No data to display   Initial Impression / Assessment and Plan / UC Course  I have reviewed the triage vital signs and the nursing notes.  Pertinent labs & imaging results that were available during my care of the patient were reviewed by me and considered in my medical decision making (see chart for details).  Clinical Course       Final Clinical Impressions(s) / UC Diagnoses   Final diagnoses:  Cough  Bronchitis    New Prescriptions Discharge Medication List as of 05/26/2016  9:37 AM    START taking these medications   Details  amoxicillin-clavulanate (AUGMENTIN) 875-125 MG tablet Take 1 tablet by mouth 2 (two) times daily., Starting Wed 05/26/2016, Normal    chlorpheniramine-HYDROcodone (TUSSIONEX PENNKINETIC ER) 10-8 MG/5ML SUER Take 5 mLs by mouth every 12 (twelve) hours as needed., Starting Wed 05/26/2016, Normal    guaiFENesin-codeine 100-10 MG/5ML syrup 5 ml po qhs prn cough, Print       1. diagnosis reviewed with patient 2. rx as per orders above; reviewed possible side effects, interactions, risks and benefits  3. Follow-up prn if symptoms worsen or don't improve   Norval Gable, MD 05/26/16 1041

## 2016-05-26 NOTE — ED Triage Notes (Signed)
Patient complains of cough, sinus pain and pressure. Patient states that she has tried tussin cough medication without relief. Patient states that she has been having shortness of breath and worsened over the last few days.

## 2016-06-01 ENCOUNTER — Ambulatory Visit: Payer: 59 | Admitting: Internal Medicine

## 2016-06-10 ENCOUNTER — Ambulatory Visit
Admission: EM | Admit: 2016-06-10 | Discharge: 2016-06-10 | Disposition: A | Discharge status: Home / Self Care | Payer: 59 | Attending: Emergency Medicine | Admitting: Emergency Medicine

## 2016-06-10 ENCOUNTER — Ambulatory Visit (INDEPENDENT_AMBULATORY_CARE_PROVIDER_SITE_OTHER): Payer: 59

## 2016-06-10 DIAGNOSIS — R0781 Pleurodynia: Secondary | ICD-10-CM

## 2016-06-10 DIAGNOSIS — J0140 Acute pansinusitis, unspecified: Secondary | ICD-10-CM | POA: Diagnosis not present

## 2016-06-10 MED ORDER — IBUPROFEN 800 MG PO TABS: 800.0000 mg | ORAL_TABLET | Freq: Three times a day (TID) | ORAL | 0 refills | Status: DC | PRN

## 2016-06-10 MED ORDER — HYDROCOD POLST-CPM POLST ER 10-8 MG/5ML PO SUER: 5.0000 mL | Freq: Two times a day (BID) | ORAL | 0 refills | Status: DC | PRN

## 2016-06-10 MED ORDER — HYDROCODONE-ACETAMINOPHEN 5-325 MG PO TABS: 2.0000 | ORAL_TABLET | Freq: Four times a day (QID) | ORAL | 0 refills | Status: DC | PRN

## 2016-06-10 MED ORDER — LEVOFLOXACIN 500 MG PO TABS: 500.0000 mg | ORAL_TABLET | Freq: Every day | ORAL | 0 refills | Status: DC

## 2016-06-10 NOTE — ED Triage Notes (Signed)
Patient complains of cough x 20 days. Patient states that she was seen here on 05/26/2016 for a cough and was treated with Augmentin. Patient states that she had a severe cough this morning and felt something pop in her left rib cage. Patient reports that she has been having pain since this occurred.

## 2016-06-10 NOTE — ED Provider Notes (Signed)
HPI  SUBJECTIVE:  Lori Knight is a 45 y.o. female who presents with a cough for the past 4 days. She reports nasal congestion, rhinorrhea, post nasal drip which is worse at night. She reports posttussive emesis. She denies fevers, bodyaches. She states that her ribs are achy from the coughing and the posttussive emesis but denies any other chest pain. She states that she was coughing this morning "felt a "pop" underneath her breasts and now she is sharp, stabbing pain that is constant and worse with walking, sitting, inspiration and torso rotation. Symptoms are better with holding ribs and standing. She has been using her codeine cough syrup, Tussionex, NyQuil area She was seen in this clinic on 11/15 for cough, fever, wheezing, thought to have a cough, bronchitis, sent home on Augmentin, Tussionex.  She also reports left-sided sinus pain and pressure, states her upper left teeth hurt for the past several weeks in addition to the nasal congestion. She reports ear popping, but no ear pain change in hearing, otorrhea. No antipyretic in past 6-8 hours. She did get a Pneumovax this year. She did not get a flu shot.  She has an extensive past medical history including diabetes, hypertension. No history of asthma, emphysema, COPD, sinusitis. She is a former smoker. LMP: Hysterectomy. PMD: Halina Maidens, MD   Past Medical History:  Diagnosis Date  . Adult hypothyroidism 06/21/2012  . Bilateral polycystic ovarian syndrome 06/21/2012  . Biliary calculus with cholecystitis 08/14/2012  . Bipolar 1 disorder ([place])   . BP (high blood pressure) 02/14/2014  . Diabetes mellitus ([place]) 08/14/2012  . Diabetes mellitus without complication ([place])    pre-diabetic  . Dysrhythmia    wore heart monitor 2016. Corrected by changing Levothyroxine dose.  Marland Kitchen Heart murmur    followed by PCP  . Hyperprolactinemia ([place]) 06/21/2012  . Hypertension   . Motion sickness    carnival rides  . PONV (postoperative nausea and  vomiting)   . Shortness of breath dyspnea    stairs. related to wt.  . Sleep apnea    has CPAP. has not used since 2011  . Vertigo    2x in last yr    Past Surgical History:  Procedure Laterality Date  . ABDOMINAL HYSTERECTOMY  2012   cervical dysplasia/ovaries remian  . COLONOSCOPY WITH PROPOFOL N/A 06/19/2015   Procedure: COLONOSCOPY WITH PROPOFOL;  Surgeon: [name], MD;  Location: [facility];  Service: Endoscopy;  Laterality: N/A;  Diabetic - oral meds   . DENTAL SURGERY    . GALLBLADDER SURGERY      Family History  Problem Relation Age of Onset  . Diabetes Maternal Grandmother   . Hypertension Maternal Grandmother   . Hyperlipidemia Maternal Grandmother   . Hypothyroidism Maternal Grandmother   . Diabetes Mother   . Hypertension Mother   . Hypothyroidism Mother   . Bipolar disorder Mother   . Breast cancer Neg Hx     Social History  Substance Use Topics  . Smoking status: Former Smoker    Packs/day: 1.00    Years: 10.00    Types: Cigarettes    Quit date: 07/12/2002  . Smokeless tobacco: Never Used     Comment: quit 2004  . Alcohol use 0.0 oz/week     Comment: rarely    No current facility-administered medications for this encounter.   Current Outpatient Prescriptions:  .  albuterol (PROVENTIL HFA;VENTOLIN HFA) 108 (90 Base) MCG/ACT inhaler, Inhale 1-2 puffs into the lungs every 6 (six)  hours as needed for wheezing or shortness of breath., Disp: 1 Inhaler, Rfl: 0 .  Ascorbic Acid (VITAMIN C) 1000 MG tablet, Take 1,000 mg by mouth daily., Disp: , Rfl:  .  Cholecalciferol 5000 units capsule, Take 1 capsule (5,000 Units total) by mouth daily., Disp: , Rfl:  .  hydrochlorothiazide (HYDRODIURIL) 25 MG tablet, Take 1 tablet (25 mg total) by mouth daily., Disp: 90 tablet, Rfl: 1 .  lamoTRIgine (LAMICTAL) 100 MG tablet, Take 300 mg by mouth daily. , Disp: , Rfl:  .  levothyroxine (SYNTHROID, LEVOTHROID) 175 MCG tablet, Take 1 tablet (175 mcg total) by mouth  daily., Disp: 30 tablet, Rfl: 5 .  losartan (COZAAR) 50 MG tablet, Take 50 mg by mouth 2 (two) times daily. , Disp: , Rfl:  .  Melatonin 5 MG CAPS, Take 15 mg by mouth Nightly. , Disp: , Rfl:  .  metFORMIN (GLUCOPHAGE) 500 MG tablet, Take 1 tablet (500 mg total) by mouth 2 (two) times daily with a meal., Disp: 180 tablet, Rfl: 3 .  Multiple Vitamin (MULTIVITAMIN) tablet, Take 1 tablet by mouth daily., Disp: , Rfl:  .  Omega-3 Fatty Acids (FISH OIL) 1200 MG CAPS, Take by mouth., Disp: , Rfl:  .  REXULTI 1 MG TABS, Take 1 tablet by mouth daily., Disp: , Rfl:  .  sitaGLIPtin (JANUVIA) 100 MG tablet, Take 1 tablet (100 mg total) by mouth daily., Disp: 90 tablet, Rfl: 1 .  chlorpheniramine-HYDROcodone (TUSSIONEX PENNKINETIC ER) 10-8 MG/5ML SUER, Take 5 mLs by mouth every 12 (twelve) hours as needed for cough., Disp: 120 mL, Rfl: 0 .  HYDROcodone-acetaminophen (NORCO/VICODIN) 5-325 MG tablet, Take 2 tablets by mouth every 6 (six) hours as needed for moderate pain., Disp: 20 tablet, Rfl: 0 .  ibuprofen (ADVIL,MOTRIN) 800 MG tablet, Take 1 tablet (800 mg total) by mouth every 8 (eight) hours as needed., Disp: 30 tablet, Rfl: 0 .  levofloxacin (LEVAQUIN) 500 MG tablet, Take 1 tablet (500 mg total) by mouth daily. X 7 days, Disp: 7 tablet, Rfl: 0  Allergies  Allergen Reactions  . Lisinopril Cough  . Latex Rash    Condoms only     ROS  As noted in HPI.   Physical Exam  BP 115/73 (BP Location: Left Arm)   Pulse 88   Temp 98.7 F (37.1 C) (Oral)   Resp 17   SpO2 95%   Constitutional: Well developed, well nourished, appears uncomfortable, obese  Eyes:  EOMI, conjunctiva normal bilaterally HENT: Normocephalic, atraumatic,mucus membranes moist TMs normal bilaterally. Positive nasal congestion, swollen, erythematous turbinates with purulent nasal drainage. Positive left maxillary and frontal sinus tenderness  Respiratory: Poor inspiratory effort. Positive tenderness along left ribs #6 or 7 in the  mid clavicular line , Tenderness along ribs #10 and 11 in the midaxillary line. No appreciable crepitus.  Cardiovascular: Normal rate regular rhythm no murmurs rubs or gallops GI: nondistended skin: No rash, skin intact Musculoskeletal: no deformities Neurologic: Alert & oriented x 3, no focal neuro deficits Psychiatric: Speech and behavior appropriate   ED Course   Medications - No data to display  Orders Placed This Encounter  Procedures  . DG Ribs Unilateral W/Chest Left    Standing Status:   Standing    Number of Occurrences:   1    Order Specific Question:   Reason for Exam (SYMPTOM  OR DIAGNOSIS REQUIRED)    Answer:   cough felt L anterior rib under breast pop r/o fx ptx  No results found for this or any previous visit (from the past 24 hour(s)). Dg Ribs Unilateral W/chest Left  Result Date: 06/10/2016 CLINICAL DATA:  Cough. EXAM: LEFT RIBS AND CHEST - 3+ VIEW COMPARISON:  03/30/2014 FINDINGS: Mediastinum hilar structures normal. Cardiomegaly with normal pulmonary vascularity. Low lung volumes. No pleural effusion or pneumothorax. No evidence of displaced rib fracture or pneumothorax. IMPRESSION: No acute or focal abnormality. Electronically Signed   By: Marcello Moores  Register   On: 06/10/2016 10:09    ED Clinical Impression  Rib pain on left side  Acute pansinusitis, recurrence not specified  ED Assessment/Plan  Imaging independently reviewed. No pleural effusion, pneumothorax, no evidence of displaced rib fracture. See radiology report for details.  Presentation consistent with hairline left rib fractures and a left-sided sinusitis.  Plan to send home with Norco, ibuprofen, more Tussionex, saline nasal irrigation, Mucinex D. We'll send home with Levaquin 500 mg by mouth twice a day for 7 days because she recently finished a course of Augmentin which should've treated her sinusitis. No incentive spirometer available here. Advised patient to take deep, regular breaths at  least several times an hour to help prevent pneumonia. Follow Up with PMD as needed. To the ER if gets worse.  Discussed  imaging, MDM, plan and followup with patient. Discussed sn/sx that should prompt return to the ED. Patient agrees with plan.   Meds ordered this encounter  Medications  . levofloxacin (LEVAQUIN) 500 MG tablet    Sig: Take 1 tablet (500 mg total) by mouth daily. X 7 days    Dispense:  7 tablet    Refill:  0  . ibuprofen (ADVIL,MOTRIN) 800 MG tablet    Sig: Take 1 tablet (800 mg total) by mouth every 8 (eight) hours as needed.    Dispense:  30 tablet    Refill:  0  . HYDROcodone-acetaminophen (NORCO/VICODIN) 5-325 MG tablet    Sig: Take 2 tablets by mouth every 6 (six) hours as needed for moderate pain.    Dispense:  20 tablet    Refill:  0  . chlorpheniramine-HYDROcodone (TUSSIONEX PENNKINETIC ER) 10-8 MG/5ML SUER    Sig: Take 5 mLs by mouth every 12 (twelve) hours as needed for cough.    Dispense:  120 mL    Refill:  0    *This clinic note was created using [employer]. Therefore, there may be occasional mistakes despite careful proofreading.  ?   Melynda Ripple, MD 06/10/16 1028

## 2016-06-10 NOTE — Discharge Instructions (Signed)
Take the medication as written. You may take 800 mg of motrin with 1 gram of tylenol up to 3 times a day as needed for pain. This is an effective combination for pain.   Use a neti pot or the NeilMed sinus rinse as often as you want to to reduce nasal congestion. Follow the directions on the box.   Take several deep breaths per hour to help prevent a pneumonia. It will take 6-8 weeks for a rib fracture to heal. The Tussionex will help sleep at night, and the Norco will help for severe pain so that you can continue taking deep breaths in.  Go to www.goodrx.com to look up your medications. This will give you a list of where you can find your prescriptions at the most affordable prices.

## 2016-06-26 ENCOUNTER — Ambulatory Visit
Admission: EM | Admit: 2016-06-26 | Discharge: 2016-06-26 | Disposition: A | Discharge status: Home / Self Care | Payer: 59 | Attending: Family Medicine | Admitting: Family Medicine

## 2016-06-26 ENCOUNTER — Encounter: Payer: Self-pay | Admitting: Emergency Medicine

## 2016-06-26 DIAGNOSIS — N393 Stress incontinence (female) (male): Secondary | ICD-10-CM

## 2016-06-26 DIAGNOSIS — N898 Other specified noninflammatory disorders of vagina: Secondary | ICD-10-CM | POA: Diagnosis not present

## 2016-06-26 DIAGNOSIS — B373 Candidiasis of vulva and vagina: Secondary | ICD-10-CM | POA: Diagnosis not present

## 2016-06-26 DIAGNOSIS — B3731 Acute candidiasis of vulva and vagina: Secondary | ICD-10-CM

## 2016-06-26 DIAGNOSIS — Z8781 Personal history of (healed) traumatic fracture: Secondary | ICD-10-CM

## 2016-06-26 LAB — URINALYSIS, COMPLETE (UACMP) WITH MICROSCOPIC
Bilirubin Urine: NEGATIVE
GLUCOSE, UA: NEGATIVE mg/dL
Ketones, ur: NEGATIVE mg/dL
Nitrite: NEGATIVE
PH: 6 (ref 5.0–8.0)
Protein, ur: NEGATIVE mg/dL
SPECIFIC GRAVITY, URINE: 1.025 (ref 1.005–1.030)

## 2016-06-26 MED ORDER — FLUCONAZOLE 150 MG PO TABS: 150.0000 mg | ORAL_TABLET | Freq: Once | ORAL | 0 refills | Status: DC

## 2016-06-26 NOTE — ED Triage Notes (Signed)
Patient c/o vaginal discharge and dysuria that started 4 days ago.  Patient denies fevers.  Patient reports ongoing cough and post nasal drip.

## 2016-06-26 NOTE — ED Provider Notes (Signed)
CSN: [id number]     Arrival date & time 06/26/16  1445 History   None    Chief Complaint  Patient presents with  . Dysuria  . Cough  . Vaginal Discharge   (Consider location/radiation/quality/duration/timing/severity/associated sxs/prior Treatment) 45 yo F with recent URI had course of back to back antibiotics- now has symptoms c/w yeast vulvovaginitis. Describes cottage cheese discharge , vulvar irritation and deep vault itching. Cough also exacerbated SUI ( present since past hysterectomy)and made necessary the use of  Depends/Always /Poise perineal pads that were also irritating. Currently has contact dysuria ,denies bladder discomfort-urine pending. Pt. has DM S/P hyst with ovaries reported in situ. Unsure of menopausal status- bipolar medication makes evaluation of flashes difficult per patient.  Patient experienced hairline  left rib fracture with cough- reports cough and rib pain almost gone since completion of atb. Occassional brief non-productive. Has hx of seasonal allergies and recently began experiencing post nasal drip. Is on new medication for bipolar and concerned about taking OTC Rx- encouraged her to discuss with her PCP      Past Medical History:  Diagnosis Date  . Adult hypothyroidism 06/21/2012  . Bilateral polycystic ovarian syndrome 06/21/2012  . Biliary calculus with cholecystitis 08/14/2012  . Bipolar 1 disorder ([place])   . BP (high blood pressure) 02/14/2014  . Diabetes mellitus ([place]) 08/14/2012  . Diabetes mellitus without complication ([place])    pre-diabetic  . Dysrhythmia    wore heart monitor 2016. Corrected by changing Levothyroxine dose.  Marland Kitchen Heart murmur    followed by PCP  . Hyperprolactinemia ([place]) 06/21/2012  . Hypertension   . Motion sickness    carnival rides  . PONV (postoperative nausea and vomiting)   . Shortness of breath dyspnea    stairs. related to wt.  . Sleep apnea    has CPAP. has not used since 2011  . Vertigo    2x in last yr   Past  Surgical History:  Procedure Laterality Date  . ABDOMINAL HYSTERECTOMY  2012   cervical dysplasia/ovaries remian  . COLONOSCOPY WITH PROPOFOL N/A 06/19/2015   Procedure: COLONOSCOPY WITH PROPOFOL;  Surgeon: [name], MD;  Location: [facility];  Service: Endoscopy;  Laterality: N/A;  Diabetic - oral meds   . DENTAL SURGERY    . GALLBLADDER SURGERY     Family History  Problem Relation Age of Onset  . Diabetes Maternal Grandmother   . Hypertension Maternal Grandmother   . Hyperlipidemia Maternal Grandmother   . Hypothyroidism Maternal Grandmother   . Diabetes Mother   . Hypertension Mother   . Hypothyroidism Mother   . Bipolar disorder Mother   . Breast cancer Neg Hx    Social History  Substance Use Topics  . Smoking status: Former Smoker    Packs/day: 1.00    Years: 10.00    Types: Cigarettes    Quit date: 07/12/2002  . Smokeless tobacco: Never Used     Comment: quit 2004  . Alcohol use 0.0 oz/week     Comment: rarely   OB History    No data available     Review of Systems  Constitutional: Negative.   HENT: Negative.   Eyes: Negative.   Respiratory: Positive for cough.        Infrequent and mild now, non-productive  Cardiovascular: Negative.   Gastrointestinal: Negative.   Endocrine: Negative.   Genitourinary: Positive for dysuria and vaginal discharge.       Perineal contact tenderness with urine stream, no bladder  pain.Symptoms c/w yeast present see HPI  Musculoskeletal: Negative.   Skin: Negative.     Allergies  Lisinopril and Latex  Home Medications   Prior to Admission medications   Medication Sig Start Date End Date Taking? Authorizing Provider  albuterol (PROVENTIL HFA;VENTOLIN HFA) 108 (90 Base) MCG/ACT inhaler Inhale 1-2 puffs into the lungs every 6 (six) hours as needed for wheezing or shortness of breath. 05/26/16   Norval Gable, MD  Ascorbic Acid (VITAMIN C) 1000 MG tablet Take 1,000 mg by mouth daily.    Historical Provider, MD   Cholecalciferol 5000 units capsule Take 1 capsule (5,000 Units total) by mouth daily. 11/28/15   Adline Potter, MD  fluconazole (DIFLUCAN) 150 MG tablet Take 1 tablet (150 mg total) by mouth once. Repeat in one week if continued symptoms 06/26/16 06/26/16  Jan Fireman, PA-C  hydrochlorothiazide (HYDRODIURIL) 25 MG tablet Take 1 tablet (25 mg total) by mouth daily. 05/10/16   [name], MD  ibuprofen (ADVIL,MOTRIN) 800 MG tablet Take 1 tablet (800 mg total) by mouth every 8 (eight) hours as needed. 06/10/16   Melynda Ripple, MD  lamoTRIgine (LAMICTAL) 100 MG tablet Take 300 mg by mouth daily.  11/28/15   Historical Provider, MD  levothyroxine (SYNTHROID, LEVOTHROID) 175 MCG tablet Take 1 tablet (175 mcg total) by mouth daily. 03/06/16   [name], MD  losartan (COZAAR) 50 MG tablet Take 50 mg by mouth 2 (two) times daily.     Historical Provider, MD  Melatonin 5 MG CAPS Take 15 mg by mouth Nightly.     Historical Provider, MD  metFORMIN (GLUCOPHAGE) 500 MG tablet Take 1 tablet (500 mg total) by mouth 2 (two) times daily with a meal. 12/09/15   Adline Potter, MD  Multiple Vitamin (MULTIVITAMIN) tablet Take 1 tablet by mouth daily.    Historical Provider, MD  Omega-3 Fatty Acids (FISH OIL) 1200 MG CAPS Take by mouth.    Historical Provider, MD  REXULTI 1 MG TABS Take 1 tablet by mouth daily. 04/06/16   Historical Provider, MD  sitaGLIPtin (JANUVIA) 100 MG tablet Take 1 tablet (100 mg total) by mouth daily. 05/10/16   [name], MD   Meds Ordered and Administered this Visit  Medications - No data to display  BP (!) 127/91 (BP Location: Left Arm)   Pulse (!) 109   Temp 98.3 F (36.8 C) (Oral)   Resp 16   Ht 5\' 6"  (1.676 m)   Wt 285 lb (129.3 kg)   SpO2 97%   BMI 46.00 kg/m  No data found.   Physical Exam  Constitutional: She is oriented to person, place, and time. She appears well-developed and well-nourished.  HENT:  Head: Normocephalic.  Eyes: EOM are normal.   Neck: Neck supple.  Cardiovascular: Normal rate and regular rhythm.   Pulmonary/Chest: Effort normal and breath sounds normal. No respiratory distress.  Minimal tenderness over area of hx left rib hairline fracture  Abdominal: Soft.  Genitourinary:  Genitourinary Comments: Pt deferred exam; reports discharge  Musculoskeletal: Normal range of motion.  NO CVAT  Neurological: She is alert and oriented to person, place, and time.  Psychiatric: She has a normal mood and affect.    Urgent Care Course   Clinical Course    U/A appears contaminated but given hx and DM will submit for culture- patient advised 3 d callback  Procedures (including critical care time)  Labs Review Labs Reviewed  URINALYSIS, COMPLETE (UACMP) WITH MICROSCOPIC - Abnormal;  Notable for the following:       Result Value   APPearance CLOUDY (*)    Hgb urine dipstick TRACE (*)    Leukocytes, UA MODERATE (*)    Squamous Epithelial / LPF 6-30 (*)    Bacteria, UA FEW (*)    All other components within normal limits  URINE CULTURE    Imaging Review No results found.    MDM   1. Vaginal discharge   2. Vulvovaginal candidiasis   3. SUI (stress urinary incontinence, female)   4. History of rib fracture    Questions fielded and recommendations reviewed with patient. She expresses understanding and accepts. Will RTC with exacerbation,concerns or failure to resolve. F/U with PCP on issues we discussed additionally.  Discharge Medication List as of 06/26/2016  5:12 PM    START taking these medications   Details  fluconazole (DIFLUCAN) 150 MG tablet Take 1 tablet (150 mg total) by mouth once. Repeat in one week if continued symptoms, Starting Sat 06/26/2016, Print         Jan Fireman, [place] 06/27/16 1535

## 2016-06-26 NOTE — Discharge Instructions (Signed)
Diflucan 150 mg 1 po now and repeat in one week if still symptomatic...call back in 3 days for urine culture results

## 2016-06-28 LAB — URINE CULTURE

## 2016-08-28 ENCOUNTER — Encounter: Payer: Self-pay | Admitting: Internal Medicine

## 2016-09-02 ENCOUNTER — Other Ambulatory Visit: Payer: Self-pay | Admitting: Internal Medicine

## 2016-09-02 ENCOUNTER — Ambulatory Visit (INDEPENDENT_AMBULATORY_CARE_PROVIDER_SITE_OTHER): Payer: 59 | Admitting: Internal Medicine

## 2016-09-02 ENCOUNTER — Encounter: Payer: Self-pay | Admitting: Internal Medicine

## 2016-09-02 VITALS — BP 122/88 | HR 88 | Ht 66.0 in | Wt 291.0 lb

## 2016-09-02 DIAGNOSIS — N898 Other specified noninflammatory disorders of vagina: Secondary | ICD-10-CM

## 2016-09-02 DIAGNOSIS — Z1272 Encounter for screening for malignant neoplasm of vagina: Secondary | ICD-10-CM

## 2016-09-02 DIAGNOSIS — E119 Type 2 diabetes mellitus without complications: Secondary | ICD-10-CM

## 2016-09-02 DIAGNOSIS — E039 Hypothyroidism, unspecified: Secondary | ICD-10-CM

## 2016-09-02 LAB — POCT WET PREP WITH KOH
KOH Prep POC: NEGATIVE
RBC Wet Prep HPF POC: 0
TRICHOMONAS UA: NEGATIVE
WBC Wet Prep HPF POC: 5

## 2016-09-02 MED ORDER — METRONIDAZOLE 500 MG PO TABS: 500.0000 mg | ORAL_TABLET | Freq: Two times a day (BID) | ORAL | 0 refills | Status: DC

## 2016-09-02 NOTE — Patient Instructions (Signed)
Bacterial Vaginosis Bacterial vaginosis is a vaginal infection that occurs when the normal balance of bacteria in the vagina is disrupted. It results from an overgrowth of certain bacteria. This is the most common vaginal infection among women ages 15-44. Because bacterial vaginosis increases your risk for STIs (sexually transmitted infections), getting treated can help reduce your risk for chlamydia, gonorrhea, herpes, and HIV (human immunodeficiency virus). Treatment is also important for preventing complications in pregnant women, because this condition can cause an early (premature) delivery. What are the causes? This condition is caused by an increase in harmful bacteria that are normally present in small amounts in the vagina. However, the reason that the condition develops is not fully understood. What increases the risk? The following factors may make you more likely to develop this condition:  Having a new sexual partner or multiple sexual partners.  Having unprotected sex.  Douching.  Having an intrauterine device (IUD).  Smoking.  Drug and alcohol abuse.  Taking certain antibiotic medicines.  Being pregnant.  You cannot get bacterial vaginosis from toilet seats, bedding, swimming pools, or contact with objects around you. What are the signs or symptoms? Symptoms of this condition include:  Grey or white vaginal discharge. The discharge can also be watery or foamy.  A fish-like odor with discharge, especially after sexual intercourse or during menstruation.  Itching in and around the vagina.  Burning or pain with urination.  Some women with bacterial vaginosis have no signs or symptoms. How is this diagnosed? This condition is diagnosed based on:  Your medical history.  A physical exam of the vagina.  Testing a sample of vaginal fluid under a microscope to look for a large amount of bad bacteria or abnormal cells. Your health care provider may use a cotton swab  or a small wooden spatula to collect the sample.  How is this treated? This condition is treated with antibiotics. These may be given as a pill, a vaginal cream, or a medicine that is put into the vagina (suppository). If the condition comes back after treatment, a second round of antibiotics may be needed. Follow these instructions at home: Medicines  Take over-the-counter and prescription medicines only as told by your health care provider.  Take or use your antibiotic as told by your health care provider. Do not stop taking or using the antibiotic even if you start to feel better. General instructions  If you have a female sexual partner, tell her that you have a vaginal infection. She should see her health care provider and be treated if she has symptoms. If you have a female sexual partner, he does not need treatment.  During treatment: ? Avoid sexual activity until you finish treatment. ? Do not douche. ? Avoid alcohol as directed by your health care provider. ? Avoid breastfeeding as directed by your health care provider.  Drink enough water and fluids to keep your urine clear or pale yellow.  Keep the area around your vagina and rectum clean. ? Wash the area daily with warm water. ? Wipe yourself from front to back after using the toilet.  Keep all follow-up visits as told by your health care provider. This is important. How is this prevented?  Do not douche.  Wash the outside of your vagina with warm water only.  Use protection when having sex. This includes latex condoms and dental dams.  Limit how many sexual partners you have. To help prevent bacterial vaginosis, it is best to have sex with just   one partner (monogamous).  Make sure you and your sexual partner are tested for STIs.  Wear cotton or cotton-lined underwear.  Avoid wearing tight pants and pantyhose, especially during summer.  Limit the amount of alcohol that you drink.  Do not use any products that  contain nicotine or tobacco, such as cigarettes and e-cigarettes. If you need help quitting, ask your health care provider.  Do not use illegal drugs. Where to find more information:  Centers for Disease Control and Prevention: www.cdc.gov/std  American Sexual Health Association (ASHA): www.ashastd.org  U.S. Department of Health and Human Services, Office on Women's Health: www.womenshealth.gov/ or https://www.womenshealth.gov/a-z-topics/bacterial-vaginosis Contact a health care provider if:  Your symptoms do not improve, even after treatment.  You have more discharge or pain when urinating.  You have a fever.  You have pain in your abdomen.  You have pain during sex.  You have vaginal bleeding between periods. Summary  Bacterial vaginosis is a vaginal infection that occurs when the normal balance of bacteria in the vagina is disrupted.  Because bacterial vaginosis increases your risk for STIs (sexually transmitted infections), getting treated can help reduce your risk for chlamydia, gonorrhea, herpes, and HIV (human immunodeficiency virus). Treatment is also important for preventing complications in pregnant women, because the condition can cause an early (premature) delivery.  This condition is treated with antibiotic medicines. These may be given as a pill, a vaginal cream, or a medicine that is put into the vagina (suppository). This information is not intended to replace advice given to you by your health care provider. Make sure you discuss any questions you have with your health care provider. Document Released: 06/28/2005 Document Revised: 03/13/2016 Document Reviewed: 03/13/2016 Elsevier Interactive Patient Education  2017 Elsevier Inc.  

## 2016-09-02 NOTE — Progress Notes (Signed)
Date:  09/02/2016   Name:  Lori Knight   DOB:  Oct 24, 1970   MRN:  [id number]   Chief Complaint: skin tags (The ones pt has have gotten bigger, and there are new ones popping up.) and Vaginal Discharge (X 3 months. Everynight has leakage from Vagina. Not urine. Having to change underwear due to large leakage of fluid.) Vaginal Discharge  The patient's primary symptoms include vaginal discharge. This is a new problem. The current episode started 1 to 4 weeks ago. The problem has been unchanged. The patient is experiencing no pain. Pertinent negatives include no abdominal pain, chills, dysuria, fever, frequency or hematuria.  Thyroid Problem  Presents for follow-up visit. Patient reports no fatigue or palpitations. Symptom course: dose changed.  Diabetes  She presents for her follow-up diabetic visit. She has type 2 diabetes mellitus. Pertinent negatives for diabetes include no chest pain and no fatigue.   Skin tag - multiple skin tags over chest, neck.  Some get irritated with jewelry and clothing.  One under right eye. She has not had a follow up PAP since her hysterectomy.  Was told to have them 3-5 year intervals to screen for vaginal cancer.  Hysterectomy was 2012 for cervical dysplasia.  Review of Systems  Constitutional: Negative for chills, fatigue and fever.  Respiratory: Negative for chest tightness and shortness of breath.   Cardiovascular: Negative for chest pain, palpitations and leg swelling.  Gastrointestinal: Negative for abdominal pain.  Genitourinary: Positive for vaginal discharge. Negative for difficulty urinating, dysuria, frequency, genital sores and hematuria.  Skin:       Skin tags  Psychiatric/Behavioral: Negative for dysphoric mood and sleep disturbance.    Patient Active Problem List   Diagnosis Date Noted  . Fibroma of foot 03/03/2016  . Neuropathic pain of finger 03/03/2016  . Diabetes mellitus type 2, controlled, without complications ([place])  [id number]  . OSA on CPAP 09/22/2015  . Intermittent palpitations 08/28/2015  . GERD (gastroesophageal reflux disease) 08/28/2015  . Insomnia 08/28/2015  . Internal hemorrhoids 08/28/2015  . Vitamin D deficiency 08/28/2015  . Obesity, Class III, BMI 40-49.9 (morbid obesity) ([place]) 08/27/2015  . Hypertension 02/14/2014  . Benign paroxysmal positional nystagmus 02/14/2014  . 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 Bipolar 1 disorder ([place]) 08/14/2012  . Adult hypothyroidism 06/21/2012  . Bilateral polycystic ovarian syndrome 06/21/2012    Prior to Admission medications   Medication Sig Start Date End Date Taking? Authorizing Provider  albuterol (PROVENTIL HFA;VENTOLIN HFA) 108 (90 Base) MCG/ACT inhaler Inhale 1-2 puffs into the lungs every 6 (six) hours as needed for wheezing or shortness of breath. 05/26/16  Yes Norval Gable, MD  Ascorbic Acid (VITAMIN C) 1000 MG tablet Take 1,000 mg by mouth daily.   Yes Historical Provider, MD  Cholecalciferol 5000 units capsule Take 1 capsule (5,000 Units total) by mouth daily. 11/28/15  Yes Adline Potter, MD  hydrochlorothiazide (HYDRODIURIL) 25 MG tablet Take 1 tablet (25 mg total) by mouth daily. 05/10/16  Yes [name], MD  lamoTRIgine (LAMICTAL) 100 MG tablet Take 300 mg by mouth daily.  11/28/15  Yes Historical Provider, MD  levothyroxine (SYNTHROID, LEVOTHROID) 175 MCG tablet Take 1 tablet (175 mcg total) by mouth daily. 03/06/16  Yes [name], MD  losartan (COZAAR) 50 MG tablet Take 50 mg by mouth 2 (two) times daily.    Yes Historical Provider, MD  Melatonin 5 MG CAPS Take 15 mg by mouth Nightly.    Yes Historical Provider,  MD  metFORMIN (GLUCOPHAGE) 500 MG tablet Take 1 tablet (500 mg total) by mouth 2 (two) times daily with a meal. 12/09/15  Yes Adline Potter, MD  Multiple Vitamin (MULTIVITAMIN) tablet Take 1 tablet by mouth daily.   Yes Historical Provider, MD  Omega-3 Fatty Acids (FISH OIL) 1200 MG CAPS Take by mouth.   Yes  Historical Provider, MD  REXULTI 1 MG TABS Take 1 tablet by mouth daily. 04/06/16  Yes Historical Provider, MD  sitaGLIPtin (JANUVIA) 100 MG tablet Take 1 tablet (100 mg total) by mouth daily. 05/10/16  Yes [name], MD  fluconazole (DIFLUCAN) 150 MG tablet Take 1 tablet (150 mg total) by mouth once. Repeat in one week if continued symptoms 06/26/16 06/26/16  Jan Fireman, PA-C  ibuprofen (ADVIL,MOTRIN) 800 MG tablet Take 1 tablet (800 mg total) by mouth every 8 (eight) hours as needed. Patient not taking: Reported on 09/02/2016 06/10/16   Melynda Ripple, MD    Allergies  Allergen Reactions  . Lisinopril Cough  . Latex Rash    Condoms only    Past Surgical History:  Procedure Laterality Date  . ABDOMINAL HYSTERECTOMY  2012   cervical dysplasia/ovaries remian  . COLONOSCOPY WITH PROPOFOL N/A 06/19/2015   Procedure: COLONOSCOPY WITH PROPOFOL;  Surgeon: [name], MD;  Location: [facility];  Service: Endoscopy;  Laterality: N/A;  Diabetic - oral meds   . DENTAL SURGERY    . GALLBLADDER SURGERY      Social History  Substance Use Topics  . Smoking status: Former Smoker    Packs/day: 1.00    Years: 10.00    Types: Cigarettes    Quit date: 07/12/2002  . Smokeless tobacco: Never Used     Comment: quit 2004  . Alcohol use 0.0 oz/week     Comment: rarely     Medication list has been reviewed and updated.   Physical Exam  Constitutional: She is oriented to person, place, and time. She appears well-developed. No distress.  HENT:  Head: Normocephalic and atraumatic.  Cardiovascular: Normal rate, regular rhythm and normal heart sounds.   Pulmonary/Chest: Effort normal and breath sounds normal. No respiratory distress.  Genitourinary: There is no tenderness, lesion or injury on the right labia. There is no tenderness, lesion or injury on the left labia. Right adnexum displays no mass, no tenderness and no fullness. Left adnexum displays no mass, no tenderness and no  fullness. No erythema, tenderness or bleeding in the vagina. Vaginal discharge found.  Genitourinary Comments: Cervix and uterus surgically absent Vaginal cuff intact  Musculoskeletal: Normal range of motion.  Neurological: She is alert and oriented to person, place, and time.  Skin: Skin is warm, dry and intact. No ecchymosis and no rash noted. No erythema.  Multiple large skin tags of neck and chest.  Several small tags under right eye  Psychiatric: She has a normal mood and affect. Her behavior is normal. Thought content normal.  Nursing note and vitals reviewed.   BP 122/88   Pulse 88   Ht 5\' 6"  (1.676 m)   Wt 291 lb (132 kg)   SpO2 97%   BMI 46.97 kg/m   Assessment and Plan: 1. Vaginal discharge BV - info sheet given - POCT Wet Prep with KOH - metroNIDAZOLE (FLAGYL) 500 MG tablet; Take 1 tablet (500 mg total) by mouth 2 (two) times daily.  Dispense: 14 tablet; Refill: 0  2. Controlled type 2 diabetes mellitus without complication, without long-term current use of insulin (  [place]) Continue current medications - Hemoglobin A1c - Comprehensive metabolic panel  3. Adult hypothyroidism Dose changed last visit - recheck labs - TSH  4. Screening for vaginal cancer - Pap IG ([facility])   Meds ordered this encounter  Medications  . metroNIDAZOLE (FLAGYL) 500 MG tablet    Sig: Take 1 tablet (500 mg total) by mouth 2 (two) times daily.    Dispense:  14 tablet    Refill:  0    Halina Maidens, MD [facility]  09/02/2016

## 2016-09-03 LAB — COMPREHENSIVE METABOLIC PANEL
ALT: 29 IU/L (ref 0–32)
AST: 19 IU/L (ref 0–40)
Albumin/Globulin Ratio: 1.4 (ref 1.2–2.2)
Albumin: 4 g/dL (ref 3.5–5.5)
Alkaline Phosphatase: 81 IU/L (ref 39–117)
BUN/Creatinine Ratio: 16 (ref 9–23)
BUN: 11 mg/dL (ref 6–24)
Bilirubin Total: <0.2 mg/dL (ref 0.0–1.2)
CO2: 26 mmol/L (ref 18–29)
Calcium: 8.6 mg/dL — ABNORMAL LOW (ref 8.7–10.2)
Chloride: 100 mmol/L (ref 96–106)
Creatinine, Ser: 0.68 mg/dL (ref 0.57–1.00)
GFR calc Af Amer: 120 mL/min/{1.73_m2} (ref >59)
GFR calc non Af Amer: 105 mL/min/{1.73_m2} (ref >59)
Globulin, Total: 2.8 g/dL (ref 1.5–4.5)
Glucose: 147 mg/dL — ABNORMAL HIGH (ref 65–99)
Potassium: 4.2 mmol/L (ref 3.5–5.2)
Sodium: 141 mmol/L (ref 134–144)
Total Protein: 6.8 g/dL (ref 6.0–8.5)

## 2016-09-03 LAB — TSH: TSH: 1.63 u[IU]/mL (ref 0.450–4.500)

## 2016-09-03 LAB — HEMOGLOBIN A1C
Est. average glucose Bld gHb Est-mCnc: 131 mg/dL
Hgb A1c MFr Bld: 6.2 % — ABNORMAL HIGH (ref 4.8–5.6)

## 2016-09-06 ENCOUNTER — Other Ambulatory Visit: Payer: Self-pay | Admitting: Internal Medicine

## 2016-09-07 LAB — PAP IG (IMAGE GUIDED): PAP Smear Comment: 0

## 2016-10-05 ENCOUNTER — Encounter: Payer: Self-pay | Admitting: Internal Medicine

## 2016-10-05 ENCOUNTER — Other Ambulatory Visit: Payer: Self-pay | Admitting: Internal Medicine

## 2016-10-05 DIAGNOSIS — N898 Other specified noninflammatory disorders of vagina: Secondary | ICD-10-CM

## 2016-10-05 MED ORDER — METRONIDAZOLE 500 MG PO TABS: 500.0000 mg | ORAL_TABLET | Freq: Two times a day (BID) | ORAL | 0 refills | Status: DC

## 2016-10-14 ENCOUNTER — Other Ambulatory Visit: Payer: Self-pay | Admitting: Internal Medicine

## 2016-10-15 ENCOUNTER — Other Ambulatory Visit: Payer: Self-pay | Admitting: Internal Medicine

## 2016-10-15 LAB — HM DIABETES EYE EXAM

## 2016-11-10 ENCOUNTER — Encounter: Payer: Self-pay | Admitting: Internal Medicine

## 2017-01-13 ENCOUNTER — Other Ambulatory Visit: Payer: Self-pay

## 2017-01-13 MED ORDER — METFORMIN HCL 500 MG PO TABS: 500.0000 mg | ORAL_TABLET | Freq: Two times a day (BID) | ORAL | 0 refills | Status: DC

## 2017-01-13 NOTE — Telephone Encounter (Signed)
Fax sent from pharmacy- [facility] in [place]- patient requesting refill on Metformin medication. Please Advise.

## 2017-01-15 ENCOUNTER — Other Ambulatory Visit: Payer: Self-pay | Admitting: Internal Medicine

## 2017-01-18 NOTE — Telephone Encounter (Signed)
What does this mean? Does she not have insurance now?

## 2017-01-18 NOTE — Telephone Encounter (Signed)
Understood thanks

## 2017-01-18 NOTE — Telephone Encounter (Signed)
Her insurance will not kick in till September.

## 2017-01-18 NOTE — Telephone Encounter (Signed)
Called patient about meds and to follow up with an office visit, patient stated she will call back after September for insurance purposes.

## 2017-04-14 ENCOUNTER — Encounter: Payer: Self-pay | Admitting: Internal Medicine

## 2017-04-14 ENCOUNTER — Ambulatory Visit (INDEPENDENT_AMBULATORY_CARE_PROVIDER_SITE_OTHER): Payer: BLUE CROSS/BLUE SHIELD | Admitting: Internal Medicine

## 2017-04-14 VITALS — BP 136/88 | HR 92 | Ht 66.0 in | Wt 292.8 lb

## 2017-04-14 DIAGNOSIS — E119 Type 2 diabetes mellitus without complications: Secondary | ICD-10-CM

## 2017-04-14 DIAGNOSIS — E559 Vitamin D deficiency, unspecified: Secondary | ICD-10-CM | POA: Diagnosis not present

## 2017-04-14 DIAGNOSIS — I10 Essential (primary) hypertension: Secondary | ICD-10-CM

## 2017-04-14 DIAGNOSIS — K219 Gastro-esophageal reflux disease without esophagitis: Secondary | ICD-10-CM | POA: Diagnosis not present

## 2017-04-14 MED ORDER — RANITIDINE HCL 150 MG PO TABS: 150.0000 mg | ORAL_TABLET | Freq: Two times a day (BID) | ORAL | 5 refills | Status: DC

## 2017-04-14 NOTE — Progress Notes (Signed)
Date:  04/14/2017   Name:  Lori Knight   DOB:  Dec 01, 1970   MRN:  [id number]   Chief Complaint: Diabetes; Chest Pain (Rib Pain. No congestion, no cough. States ribs just hurts and feels heavy. States nothing to do with heart but entire ribs are hurting. ); and Vitamin D (Wants Vitamin D Tested. Psychiatrist asked for results. )  Diabetes  She presents for her follow-up diabetic visit. She has type 2 diabetes mellitus. Pertinent negatives for hypoglycemia include no dizziness or headaches. Associated symptoms include chest pain. Pertinent negatives for diabetes include no weakness. Symptoms are stable. Current diabetic treatment includes oral agent (dual therapy) Celesta Gentile and metformin). She is compliant with treatment most of the time.  Chest Pain   This is a recurrent problem. The problem occurs intermittently. The pain is mild. The quality of the pain is described as burning. The pain radiates to the precordial region, mid back and left neck. Associated symptoms include back pain (lumbar, not thoracic). Pertinent negatives include no dizziness, exertional chest pressure, fever, headaches, irregular heartbeat, numbness, palpitations, shortness of breath, vomiting or weakness. The pain is aggravated by nothing. She has tried nothing for the symptoms.  Her past medical history is significant for hypertension. Past medical history comments: gerd - previously on prevacid  Hypertension  This is a chronic problem. The problem is controlled. Associated symptoms include chest pain. Pertinent negatives include no headaches, palpitations or shortness of breath.  Gastroesophageal Reflux  She complains of chest pain. She reports no stridor or no wheezing. This is a recurrent problem.   Lab Results  Component Value Date   HGBA1C 6.2 (H) 09/02/2016     Review of Systems  Constitutional: Negative for chills and fever.  Respiratory: Negative for chest tightness, shortness of breath and wheezing.     Cardiovascular: Positive for chest pain. Negative for palpitations and leg swelling.  Gastrointestinal: Negative for constipation, diarrhea and vomiting.       Reflux  Genitourinary: Negative for dysuria.  Musculoskeletal: Positive for back pain (lumbar, not thoracic).  Neurological: Negative for dizziness, weakness, numbness and headaches.  Hematological: Negative for adenopathy.  Psychiatric/Behavioral: Negative for sleep disturbance.    Patient Active Problem List   Diagnosis Date Noted  . Fibroma of foot 03/03/2016  . Neuropathic pain of finger 03/03/2016  . Diabetes mellitus type 2, controlled, without complications ([place]) 54/62/7035  . OSA on CPAP 09/22/2015  . Intermittent palpitations 08/28/2015  . GERD (gastroesophageal reflux disease) 08/28/2015  . Insomnia 08/28/2015  . Internal hemorrhoids 08/28/2015  . Vitamin D deficiency 08/28/2015  . Obesity, Class III, BMI 40-49.9 (morbid obesity) ([place]) 08/27/2015  . Hypertension 02/14/2014  . Benign paroxysmal positional nystagmus 02/14/2014  . 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 Bipolar 1 disorder ([place]) 08/14/2012  . Adult hypothyroidism 06/21/2012  . Bilateral polycystic ovarian syndrome 06/21/2012    Prior to Admission medications   Medication Sig Start Date End Date Taking? Authorizing Provider  albuterol (PROVENTIL HFA;VENTOLIN HFA) 108 (90 Base) MCG/ACT inhaler Inhale 1-2 puffs into the lungs every 6 (six) hours as needed for wheezing or shortness of breath. 05/26/16  Yes Norval Gable, MD  Ascorbic Acid (VITAMIN C) 1000 MG tablet Take 1,000 mg by mouth daily.   Yes [provider]  Cholecalciferol 5000 units capsule Take 1 capsule (5,000 Units total) by mouth daily. 11/28/15  Yes Plonk, Gwyndolyn Saxon, MD  hydrochlorothiazide (HYDRODIURIL) 25 MG tablet TAKE 1 TABLET (25 MG TOTAL) BY MOUTH DAILY. 10/14/16  Yes [name], MD  ibuprofen (ADVIL,MOTRIN) 800 MG tablet Take 1 tablet (800 mg total) by mouth every 8  (eight) hours as needed. 06/10/16  Yes Melynda Ripple, MD  JANUVIA 100 MG tablet TAKE 1 TABLET (100 MG TOTAL) BY MOUTH DAILY. 10/14/16  Yes [name], MD  lamoTRIgine (LAMICTAL) 100 MG tablet Take 300 mg by mouth daily.  11/28/15  Yes [provider]  levothyroxine (SYNTHROID, LEVOTHROID) 175 MCG tablet TAKE 1 TABLET (175 MCG TOTAL) BY MOUTH DAILY. 01/16/17  Yes [name], MD  losartan (COZAAR) 50 MG tablet TAKE 1 TABLET (50 MG TOTAL) BY MOUTH 2 (TWO) TIMES DAILY. 10/15/16  Yes [name], MD  Melatonin 5 MG CAPS Take 15 mg by mouth Nightly.    Yes [provider]  metFORMIN (GLUCOPHAGE) 500 MG tablet Take 1 tablet (500 mg total) by mouth 2 (two) times daily with a meal. 01/13/17  Yes [name], MD  Multiple Vitamin (MULTIVITAMIN) tablet Take 1 tablet by mouth daily.   Yes [provider]  Omega-3 Fatty Acids (FISH OIL) 1200 MG CAPS Take by mouth.   Yes [provider]    Allergies  Allergen Reactions  . Lisinopril Cough  . Latex Rash    Condoms only    Past Surgical History:  Procedure Laterality Date  . ABDOMINAL HYSTERECTOMY  2012   cervical dysplasia/ovaries remian  . COLONOSCOPY WITH PROPOFOL N/A 06/19/2015   Procedure: COLONOSCOPY WITH PROPOFOL;  Surgeon: [name], MD;  Location: [facility];  Service: Endoscopy;  Laterality: N/A;  Diabetic - oral meds   . DENTAL SURGERY    . GALLBLADDER SURGERY      Social History  Substance Use Topics  . Smoking status: Former Smoker    Packs/day: 1.00    Years: 10.00    Types: Cigarettes    Quit date: 07/12/2002  . Smokeless tobacco: Never Used     Comment: quit 2004  . Alcohol use 0.0 oz/week     Comment: rarely     Medication list has been reviewed and updated.  PHQ 2/9 Scores 04/14/2017 12/19/2015 11/27/2015 08/27/2015  PHQ - 2 Score 0 0 - -  Exception Documentation - - Other- indicate reason in comment box Other- indicate reason in comment box  Not completed -  - psychiatry appt next week Follows Psych    Physical Exam  Constitutional: She is oriented to person, place, and time. She appears well-developed. No distress.  HENT:  Head: Normocephalic and atraumatic.  Neck: Normal range of motion. Neck supple. No thyromegaly present.  Cardiovascular: Normal rate, regular rhythm and normal heart sounds.   Pulmonary/Chest: Effort normal and breath sounds normal. No respiratory distress. She has no wheezes.  Abdominal: Soft. Bowel sounds are normal. There is tenderness in the epigastric area. There is no rigidity, no rebound and no guarding.  Musculoskeletal: Normal range of motion.  Neurological: She is alert and oriented to person, place, and time. No sensory deficit.  Skin: Skin is warm and dry. No rash noted.  Psychiatric: She has a normal mood and affect. Her speech is normal and behavior is normal. Thought content normal.  Nursing note and vitals reviewed.   BP 136/88   Pulse 92   Ht 5\' 6"  (1.676 m)   Wt 292 lb 12.8 oz (132.8 kg)   SpO2 96%   BMI 47.26 kg/m   Assessment and Plan: 1. Controlled type 2 diabetes mellitus without complication, without long-term current  use of insulin (HCC) Continue oral agents - Hemoglobin A1c - Comprehensive metabolic panel  2. Essential hypertension controlled  3. Vitamin D deficiency supplementing - VITAMIN D 25 Hydroxy (Vit-D Deficiency, Fractures)  4. Gastroesophageal reflux disease, esophagitis presence not specified May be the source of atypical rib and chest pain - rec trial of H2 blocker - ranitidine (ZANTAC) 150 MG tablet; Take 1 tablet (150 mg total) by mouth 2 (two) times daily.  Dispense: 60 tablet; Refill: 5   Meds ordered this encounter  Medications  . ranitidine (ZANTAC) 150 MG tablet    Sig: Take 1 tablet (150 mg total) by mouth 2 (two) times daily.    Dispense:  60 tablet    Refill:  5    Partially dictated using Editor, commissioning. Any errors are unintentional.  Halina Maidens, MD [facility]  04/14/2017

## 2017-04-15 LAB — COMPREHENSIVE METABOLIC PANEL
ALBUMIN: 4.2 g/dL (ref 3.5–5.5)
ALT: 33 IU/L — ABNORMAL HIGH (ref 0–32)
AST: 22 IU/L (ref 0–40)
Albumin/Globulin Ratio: 1.6 (ref 1.2–2.2)
Alkaline Phosphatase: 94 IU/L (ref 39–117)
BUN / CREAT RATIO: 15 (ref 9–23)
BUN: 12 mg/dL (ref 6–24)
Bilirubin Total: 0.3 mg/dL (ref 0.0–1.2)
CALCIUM: 9.2 mg/dL (ref 8.7–10.2)
CO2: 21 mmol/L (ref 20–29)
CREATININE: 0.8 mg/dL (ref 0.57–1.00)
Chloride: 102 mmol/L (ref 96–106)
GFR calc Af Amer: 102 mL/min/{1.73_m2} (ref >59)
GFR, EST NON AFRICAN AMERICAN: 88 mL/min/{1.73_m2} (ref >59)
GLOBULIN, TOTAL: 2.7 g/dL (ref 1.5–4.5)
Glucose: 157 mg/dL — ABNORMAL HIGH (ref 65–99)
Potassium: 4.3 mmol/L (ref 3.5–5.2)
SODIUM: 140 mmol/L (ref 134–144)
Total Protein: 6.9 g/dL (ref 6.0–8.5)

## 2017-04-15 LAB — VITAMIN D 25 HYDROXY (VIT D DEFICIENCY, FRACTURES): Vit D, 25-Hydroxy: 43.1 ng/mL (ref 30.0–100.0)

## 2017-04-15 LAB — HEMOGLOBIN A1C
Est. average glucose Bld gHb Est-mCnc: 160 mg/dL
Hgb A1c MFr Bld: 7.2 % — ABNORMAL HIGH (ref 4.8–5.6)

## 2017-05-01 ENCOUNTER — Ambulatory Visit
Admission: EM | Admit: 2017-05-01 | Discharge: 2017-05-01 | Disposition: A | Discharge status: Home / Self Care | Payer: BLUE CROSS/BLUE SHIELD | Attending: Family Medicine | Admitting: Family Medicine

## 2017-05-01 DIAGNOSIS — N39498 Other specified urinary incontinence: Secondary | ICD-10-CM

## 2017-05-01 DIAGNOSIS — J209 Acute bronchitis, unspecified: Secondary | ICD-10-CM | POA: Diagnosis not present

## 2017-05-01 LAB — URINALYSIS, COMPLETE (UACMP) WITH MICROSCOPIC
BILIRUBIN URINE: NEGATIVE
GLUCOSE, UA: NEGATIVE mg/dL
Hgb urine dipstick: NEGATIVE
KETONES UR: NEGATIVE mg/dL
LEUKOCYTES UA: NEGATIVE
Nitrite: NEGATIVE
PH: 5.5 (ref 5.0–8.0)
Protein, ur: NEGATIVE mg/dL
RBC / HPF: NONE SEEN RBC/hpf (ref 0–5)
Specific Gravity, Urine: 1.025 (ref 1.005–1.030)

## 2017-05-01 MED ORDER — HYDROCOD POLST-CPM POLST ER 10-8 MG/5ML PO SUER
5.0000 mL | Freq: Two times a day (BID) | ORAL | 0 refills | Status: DC | PRN
Start: 2017-05-01 — End: 2017-05-20

## 2017-05-01 MED ORDER — BENZONATATE 100 MG PO CAPS: 100.0000 mg | ORAL_CAPSULE | Freq: Three times a day (TID) | ORAL | 0 refills | Status: DC | PRN

## 2017-05-01 NOTE — ED Provider Notes (Signed)
[facility]    CSN: [id number] Arrival date & time: 05/01/17  0827  History   Chief Complaint Chief Complaint  Patient presents with  . Cough   HPI  46 year old female presents with severe cough.  Patient states she's been sick for the past week. She's had severe cough, chest tightness, sore throat, and associated back pain. Cough is severe and nonproductive. No associated fever. She's taken some leftover cough medication from previous bouts with improvement but states that it makes her quite sedated. She reports shortness of breath with a cough. She also states that she has urinary incontinence associated with a cough. No other associated symptoms. No other complaints or concerns at this time.  Past Medical History:  Diagnosis Date  . Adult hypothyroidism 06/21/2012  . Bilateral polycystic ovarian syndrome 06/21/2012  . Biliary calculus with cholecystitis 08/14/2012  . Bipolar 1 disorder ([place])   . BP (high blood pressure) 02/14/2014  . Diabetes mellitus ([place]) 08/14/2012  . Diabetes mellitus without complication ([place])    pre-diabetic  . Dysrhythmia    wore heart monitor 2016. Corrected by changing Levothyroxine dose.  Marland Kitchen Heart murmur    followed by PCP  . Hyperprolactinemia ([place]) 06/21/2012  . Hypertension   . Motion sickness    carnival rides  . PONV (postoperative nausea and vomiting)   . Shortness of breath dyspnea    stairs. related to wt.  . Sleep apnea    has CPAP. has not used since 2011  . Vertigo    2x in last yr    Patient Active Problem List   Diagnosis Date Noted  . Fibroma of foot 03/03/2016  . Neuropathic pain of finger 03/03/2016  . Diabetes mellitus type 2, controlled, without complications ([place]) 84/13/2440  . OSA on CPAP 09/22/2015  . Intermittent palpitations 08/28/2015  . GERD (gastroesophageal reflux disease) 08/28/2015  . Insomnia 08/28/2015  . Internal hemorrhoids 08/28/2015  . Vitamin D deficiency 08/28/2015  . Obesity, Class III,  BMI 40-49.9 (morbid obesity) ([place]) 08/27/2015  . Hypertension 02/14/2014  . Benign paroxysmal positional nystagmus 02/14/2014  . 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 Bipolar 1 disorder ([place]) 08/14/2012  . Adult hypothyroidism 06/21/2012  . Bilateral polycystic ovarian syndrome 06/21/2012    Past Surgical History:  Procedure Laterality Date  . ABDOMINAL HYSTERECTOMY  2012   cervical dysplasia/ovaries remian  . COLONOSCOPY WITH PROPOFOL N/A 06/19/2015   Procedure: COLONOSCOPY WITH PROPOFOL;  Surgeon: [name], MD;  Location: [facility];  Service: Endoscopy;  Laterality: N/A;  Diabetic - oral meds   . DENTAL SURGERY    . GALLBLADDER SURGERY      OB History    No data available       Home Medications    Prior to Admission medications   Medication Sig Start Date End Date Taking? Authorizing Provider  albuterol (PROVENTIL HFA;VENTOLIN HFA) 108 (90 Base) MCG/ACT inhaler Inhale 1-2 puffs into the lungs every 6 (six) hours as needed for wheezing or shortness of breath. 05/26/16   Norval Gable, MD  Ascorbic Acid (VITAMIN C) 1000 MG tablet Take 1,000 mg by mouth daily.    [provider]  benzonatate (TESSALON) 100 MG capsule Take 1 capsule (100 mg total) by mouth 3 (three) times daily as needed for cough. 05/01/17   Coral Spikes, DO  chlorpheniramine-HYDROcodone (TUSSIONEX PENNKINETIC ER) 10-8 MG/5ML SUER Take 5 mLs by mouth every 12 (twelve) hours as needed. 05/01/17   Coral Spikes, DO  Cholecalciferol  5000 units capsule Take 1 capsule (5,000 Units total) by mouth daily. 11/28/15   Plonk, Gwyndolyn Saxon, MD  hydrochlorothiazide (HYDRODIURIL) 25 MG tablet TAKE 1 TABLET (25 MG TOTAL) BY MOUTH DAILY. 10/14/16   [name], MD  ibuprofen (ADVIL,MOTRIN) 800 MG tablet Take 1 tablet (800 mg total) by mouth every 8 (eight) hours as needed. 06/10/16   Melynda Ripple, MD  JANUVIA 100 MG tablet TAKE 1 TABLET (100 MG TOTAL) BY MOUTH DAILY. 10/14/16   [name], MD    lamoTRIgine (LAMICTAL) 100 MG tablet Take 300 mg by mouth daily.  11/28/15   [provider]  levothyroxine (SYNTHROID, LEVOTHROID) 175 MCG tablet TAKE 1 TABLET (175 MCG TOTAL) BY MOUTH DAILY. 01/16/17   [name], MD  losartan (COZAAR) 50 MG tablet TAKE 1 TABLET (50 MG TOTAL) BY MOUTH 2 (TWO) TIMES DAILY. 10/15/16   [name], MD  Melatonin 5 MG CAPS Take 15 mg by mouth Nightly.     [provider]  metFORMIN (GLUCOPHAGE) 500 MG tablet Take 1 tablet (500 mg total) by mouth 2 (two) times daily with a meal. 01/13/17   [name], MD  Multiple Vitamin (MULTIVITAMIN) tablet Take 1 tablet by mouth daily.    [provider]  Omega-3 Fatty Acids (FISH OIL) 1200 MG CAPS Take by mouth.    [provider]  ranitidine (ZANTAC) 150 MG tablet Take 1 tablet (150 mg total) by mouth 2 (two) times daily. 04/14/17   [name], MD    Family History Family History  Problem Relation Age of Onset  . Diabetes Maternal Grandmother   . Hypertension Maternal Grandmother   . Hyperlipidemia Maternal Grandmother   . Hypothyroidism Maternal Grandmother   . Diabetes Mother   . Hypertension Mother   . Hypothyroidism Mother   . Bipolar disorder Mother   . Breast cancer Neg Hx     Social History Social History  Substance Use Topics  . Smoking status: Former Smoker    Packs/day: 1.00    Years: 10.00    Types: Cigarettes    Quit date: 07/12/2002  . Smokeless tobacco: Never Used     Comment: quit 2004  . Alcohol use 0.0 oz/week     Comment: rarely     Allergies   Lisinopril and Latex   Review of Systems Review of Systems  Constitutional: Negative for fever.  Respiratory: Positive for cough, chest tightness and shortness of breath.   Genitourinary:       Incontinence with cough.   Physical Exam Triage Vital Signs ED Triage Vitals [05/01/17 0902]  Enc Vitals Group     BP (!) 147/101     Pulse Rate 83     Resp 20     Temp 98 F (36.7 C)      Temp Source Oral     SpO2 96 %     Weight 292 lb (132.5 kg)     Height 5\' 6"  (1.676 m)     Head Circumference      Peak Flow      Pain Score      Pain Loc      Pain Edu?      Excl. in [place]?    Updated Vital Signs BP (!) 147/101 (BP Location: Right Arm) Comment: hasn't taken her b/p meds x 2 days  Pulse 83   Temp 98 F (36.7 C) (Oral)   Resp 20   Ht 5\' 6"  (1.676 m)  Wt 292 lb (132.5 kg)   SpO2 96%   BMI 47.13 kg/m  Physical Exam  Constitutional: She is oriented to person, place, and time. She appears well-developed. No distress.  HENT:  Head: Normocephalic and atraumatic.  Mouth/Throat: Oropharynx is clear and moist.  Normal TM's bilaterally.  Cardiovascular: Normal rate and regular rhythm.   No murmur heard. Pulmonary/Chest: Effort normal and breath sounds normal. No respiratory distress. She has no wheezes. She has no rales.  Musculoskeletal:  Diffuse tenderness of the back. Tenderness with minimal palpation. Out of proportion to exam.  Neurological: She is alert and oriented to person, place, and time.  Psychiatric:  Flat affect.  Vitals reviewed.  UC Treatments / Results  Labs (all labs ordered are listed, but only abnormal results are displayed) Labs Reviewed  URINALYSIS, COMPLETE (UACMP) WITH MICROSCOPIC - Abnormal; Notable for the following:       Result Value   Squamous Epithelial / LPF 6-30 (*)    Bacteria, UA RARE (*)    All other components within normal limits    EKG  EKG Interpretation None       Radiology No results found.  Procedures Procedures (including critical care time)  Medications Ordered in UC Medications - No data to display   Initial Impression / Assessment and Plan / UC Course  I have reviewed the triage vital signs and the nursing notes.  Pertinent labs & imaging results that were available during my care of the patient were reviewed by me and considered in my medical decision making (see chart for details).     46 year old female presents with acute bronchitis. Treating with Tessalon and tussionex. Antibiotic not indicated. Declined Prednisone.  Final Clinical Impressions(s) / UC Diagnoses   Final diagnoses:  Acute bronchitis, unspecified organism   New Prescriptions Discharge Medication List as of 05/01/2017  9:26 AM    START taking these medications   Details  benzonatate (TESSALON) 100 MG capsule Take 1 capsule (100 mg total) by mouth 3 (three) times daily as needed for cough., Starting Sun 05/01/2017, Normal    chlorpheniramine-HYDROcodone (TUSSIONEX PENNKINETIC ER) 10-8 MG/5ML SUER Take 5 mLs by mouth every 12 (twelve) hours as needed., Starting Sun 05/01/2017, Print       Controlled Substance Prescriptions [place] Controlled Substance Registry consulted? No   Coral Spikes, [place] 05/01/17 [phone]

## 2017-05-01 NOTE — Discharge Instructions (Signed)
Cough medication as prescribed. ° °Take care ° °Dr. Stella Bortle  °

## 2017-05-01 NOTE — ED Triage Notes (Addendum)
Pt reports cough starting last weekend. Taking old Tussionex and has helped some. Non-productive. Now feels like it moved into her chest. Also reports low back pain all the way across her back. Pain 4/10. No fevers. Reports she is urinating more frequently than usual.

## 2017-05-11 ENCOUNTER — Encounter: Payer: Self-pay | Admitting: Internal Medicine

## 2017-05-11 ENCOUNTER — Ambulatory Visit (INDEPENDENT_AMBULATORY_CARE_PROVIDER_SITE_OTHER): Payer: BLUE CROSS/BLUE SHIELD | Admitting: Internal Medicine

## 2017-05-11 VITALS — BP 146/92 | HR 94 | Temp 98.1°F | Ht 66.0 in | Wt 283.0 lb

## 2017-05-11 DIAGNOSIS — J40 Bronchitis, not specified as acute or chronic: Secondary | ICD-10-CM

## 2017-05-11 DIAGNOSIS — M62830 Muscle spasm of back: Secondary | ICD-10-CM | POA: Diagnosis not present

## 2017-05-11 MED ORDER — DOXYCYCLINE HYCLATE 100 MG PO TABS: 100.0000 mg | ORAL_TABLET | Freq: Two times a day (BID) | ORAL | 0 refills | Status: DC

## 2017-05-11 MED ORDER — ALBUTEROL SULFATE HFA 108 (90 BASE) MCG/ACT IN AERS: 2.0000 | INHALATION_SPRAY | RESPIRATORY_TRACT | 0 refills | Status: DC | PRN

## 2017-05-11 MED ORDER — HYDROCHLOROTHIAZIDE 25 MG PO TABS: 25.0000 mg | ORAL_TABLET | Freq: Every day | ORAL | 1 refills | Status: DC

## 2017-05-11 NOTE — Progress Notes (Signed)
Date:  05/11/2017   Name:  Lori Knight   DOB:  February 04, 1971   MRN:  [id number]   Chief Complaint: Cough (Sick and coughing since over a month. Seen UC on the 21st- gave two cough medicines. Still not better. Hydrocodone at night and benzononate during the day makes her fall asleep while driving to work. Coughing is now causing vomiting and peeing on herself. )  Cough  This is a new problem. The current episode started 1 to 4 weeks ago. The problem has been gradually worsening. The problem occurs every few minutes. The cough is productive of sputum. Pertinent negatives include no chest pain, chills, fever, headaches, shortness of breath or wheezing. She has tried prescription cough suppressant (was seen in UC 10 days ago) for the symptoms. The treatment provided mild relief.  Back Pain  This is a recurrent problem. The problem occurs intermittently. The pain is present in the lumbar spine (paraspinous muscles). The pain is moderate. The symptoms are aggravated by bending, coughing and twisting. Pertinent negatives include no abdominal pain, chest pain, fever or headaches. She has tried nothing for the symptoms.     Review of Systems  Constitutional: Negative for chills, fatigue and fever.  Respiratory: Positive for cough and chest tightness. Negative for shortness of breath and wheezing.   Cardiovascular: Negative for chest pain and palpitations.  Gastrointestinal: Positive for vomiting. Negative for abdominal pain and diarrhea.  Musculoskeletal: Positive for back pain. Negative for gait problem.  Neurological: Negative for dizziness and headaches.    Patient Active Problem List   Diagnosis Date Noted  . Fibroma of foot 03/03/2016  . Neuropathic pain of finger 03/03/2016  . Diabetes mellitus type 2, controlled, without complications ([place]) 52/77/8242  . OSA on CPAP 09/22/2015  . Intermittent palpitations 08/28/2015  . GERD (gastroesophageal reflux disease) 08/28/2015  . Insomnia  08/28/2015  . Internal hemorrhoids 08/28/2015  . Vitamin D deficiency 08/28/2015  . Obesity, Class III, BMI 40-49.9 (morbid obesity) ([place]) 08/27/2015  . Hypertension 02/14/2014  . Benign paroxysmal positional nystagmus 02/14/2014  . 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 Bipolar 1 disorder ([place]) 08/14/2012  . Adult hypothyroidism 06/21/2012  . Bilateral polycystic ovarian syndrome 06/21/2012    Prior to Admission medications   Medication Sig Start Date End Date Taking? Authorizing Provider  albuterol (PROVENTIL HFA;VENTOLIN HFA) 108 (90 Base) MCG/ACT inhaler Inhale 1-2 puffs into the lungs every 6 (six) hours as needed for wheezing or shortness of breath. 05/26/16  Yes Norval Gable, MD  Ascorbic Acid (VITAMIN C) 1000 MG tablet Take 1,000 mg by mouth daily.   Yes [provider]  benzonatate (TESSALON) 100 MG capsule Take 1 capsule (100 mg total) by mouth 3 (three) times daily as needed for cough. 05/01/17  Yes Cook, Jayce G, DO  chlorpheniramine-HYDROcodone (TUSSIONEX PENNKINETIC ER) 10-8 MG/5ML SUER Take 5 mLs by mouth every 12 (twelve) hours as needed. 05/01/17  Yes Coral Spikes, DO  Cholecalciferol 5000 units capsule Take 1 capsule (5,000 Units total) by mouth daily. 11/28/15  Yes Plonk, Gwyndolyn Saxon, MD  hydrochlorothiazide (HYDRODIURIL) 25 MG tablet TAKE 1 TABLET (25 MG TOTAL) BY MOUTH DAILY. 10/14/16  Yes [name], MD  ibuprofen (ADVIL,MOTRIN) 800 MG tablet Take 1 tablet (800 mg total) by mouth every 8 (eight) hours as needed. 06/10/16  Yes Melynda Ripple, MD  JANUVIA 100 MG tablet TAKE 1 TABLET (100 MG TOTAL) BY MOUTH DAILY. 10/14/16  Yes [name], MD  lamoTRIgine (LAMICTAL)  100 MG tablet Take 300 mg by mouth daily.  11/28/15  Yes [provider]  levothyroxine (SYNTHROID, LEVOTHROID) 175 MCG tablet TAKE 1 TABLET (175 MCG TOTAL) BY MOUTH DAILY. 01/16/17  Yes [name], MD  losartan (COZAAR) 50 MG tablet TAKE 1 TABLET (50 MG TOTAL) BY MOUTH 2 (TWO)  TIMES DAILY. 10/15/16  Yes [name], MD  Melatonin 5 MG CAPS Take 15 mg by mouth Nightly.    Yes [provider]  metFORMIN (GLUCOPHAGE) 500 MG tablet Take 1 tablet (500 mg total) by mouth 2 (two) times daily with a meal. 01/13/17  Yes [name], MD  Multiple Vitamin (MULTIVITAMIN) tablet Take 1 tablet by mouth daily.   Yes [provider]  Omega-3 Fatty Acids (FISH OIL) 1200 MG CAPS Take by mouth.   Yes [provider]  ranitidine (ZANTAC) 150 MG tablet Take 1 tablet (150 mg total) by mouth 2 (two) times daily. 04/14/17  Yes [name], MD    Allergies  Allergen Reactions  . Lisinopril Cough  . Latex Rash    Condoms only    Past Surgical History:  Procedure Laterality Date  . ABDOMINAL HYSTERECTOMY  2012   cervical dysplasia/ovaries remian  . COLONOSCOPY WITH PROPOFOL N/A 06/19/2015   Procedure: COLONOSCOPY WITH PROPOFOL;  Surgeon: [name], MD;  Location: [facility];  Service: Endoscopy;  Laterality: N/A;  Diabetic - oral meds   . DENTAL SURGERY    . GALLBLADDER SURGERY      Social History  Substance Use Topics  . Smoking status: Former Smoker    Packs/day: 1.00    Years: 10.00    Types: Cigarettes    Quit date: 07/12/2002  . Smokeless tobacco: Never Used     Comment: quit 2004  . Alcohol use 0.0 oz/week     Comment: rarely     Medication list has been reviewed and updated.  PHQ 2/9 Scores 04/14/2017 12/19/2015 11/27/2015 08/27/2015  PHQ - 2 Score 0 0 - -  Exception Documentation - - Other- indicate reason in comment box Other- indicate reason in comment box  Not completed - - psychiatry appt next week Follows Psych    Physical Exam  Constitutional: She is oriented to person, place, and time. She appears well-developed. No distress.  HENT:  Head: Normocephalic and atraumatic.  Cardiovascular: Normal rate, regular rhythm and normal heart sounds.   Pulmonary/Chest: Effort normal. No respiratory distress. She has  decreased breath sounds (bronchial breat sounds). She has no wheezes. She has no rhonchi.  Musculoskeletal: Normal range of motion.       Arms: Neurological: She is alert and oriented to person, place, and time.  Skin: Skin is warm and dry. No rash noted.  Psychiatric: She has a normal mood and affect. Her behavior is normal. Thought content normal.  Nursing note and vitals reviewed.   BP (!) 146/92   Pulse 94   Temp 98.1 F (36.7 C) (Oral)   Ht 5\' 6"  (1.676 m)   Wt 283 lb (128.4 kg)   SpO2 95%   BMI 45.68 kg/m   Assessment and Plan: 1. Bronchitis Resume inhaler, continue tussionex at hs - albuterol (PROVENTIL HFA;VENTOLIN HFA) 108 (90 Base) MCG/ACT inhaler; Inhale 2 puffs into the lungs every 4 (four) hours as needed for wheezing or shortness of breath.  Dispense: 1 Inhaler; Refill: 0 - doxycycline (VIBRA-TABS) 100 MG tablet; Take 1 tablet (100 mg total) by mouth 2 (two) times daily.  Dispense: 20 tablet; Refill: 0  2. Spasm of muscle of lower back Use heat and tylenol as needed   Meds ordered this encounter  Medications  . albuterol (PROVENTIL HFA;VENTOLIN HFA) 108 (90 Base) MCG/ACT inhaler    Sig: Inhale 2 puffs into the lungs every 4 (four) hours as needed for wheezing or shortness of breath.    Dispense:  1 Inhaler    Refill:  0  . doxycycline (VIBRA-TABS) 100 MG tablet    Sig: Take 1 tablet (100 mg total) by mouth 2 (two) times daily.    Dispense:  20 tablet    Refill:  0  . hydrochlorothiazide (HYDRODIURIL) 25 MG tablet    Sig: Take 1 tablet (25 mg total) by mouth daily.    Dispense:  90 tablet    Refill:  1    Partially dictated using Editor, commissioning. Any errors are unintentional.  Halina Maidens, MD [facility]  05/11/2017

## 2017-05-20 ENCOUNTER — Encounter: Payer: Self-pay | Admitting: Internal Medicine

## 2017-05-20 ENCOUNTER — Other Ambulatory Visit: Payer: Self-pay | Admitting: Internal Medicine

## 2017-05-20 ENCOUNTER — Ambulatory Visit: Payer: BLUE CROSS/BLUE SHIELD | Admitting: Internal Medicine

## 2017-05-20 VITALS — BP 124/70 | HR 96 | Temp 98.7°F | Ht 66.0 in | Wt 283.0 lb

## 2017-05-20 DIAGNOSIS — J40 Bronchitis, not specified as acute or chronic: Secondary | ICD-10-CM

## 2017-05-20 MED ORDER — HYDROCOD POLST-CPM POLST ER 10-8 MG/5ML PO SUER: 5.0000 mL | Freq: Two times a day (BID) | ORAL | 0 refills | Status: DC

## 2017-05-20 MED ORDER — AMOXICILLIN-POT CLAVULANATE 875-125 MG PO TABS: 1.0000 | ORAL_TABLET | Freq: Two times a day (BID) | ORAL | 0 refills | Status: DC

## 2017-05-20 MED ORDER — FLUTICASONE PROPIONATE 50 MCG/ACT NA SUSP: 2.0000 | Freq: Every day | NASAL | 1 refills | Status: DC

## 2017-05-20 NOTE — Progress Notes (Signed)
Date:  05/20/2017   Name:  Lori Knight   DOB:  02-15-1971   MRN:  [id number]   Chief Complaint: Bronchitis (no better from last week. Coughing so much she is now throwing up. Taking antibiotics and cough syrup with no relief. once starts breathing and talking she starts to vomit. Feels stuck in chest. Because having history of pnuemonia, concerned of it.  )  Cough  This is a chronic problem. The current episode started in the past 7 days. The problem occurs constantly. The cough is non-productive. Pertinent negatives include no chest pain, chills, fever, rash, shortness of breath or wheezing. The symptoms are aggravated by lying down. Treatments tried: finished doxycycline and tussionex. The treatment provided no relief.     Review of Systems  Constitutional: Positive for fatigue. Negative for chills and fever.  Respiratory: Positive for cough and chest tightness. Negative for shortness of breath and wheezing.   Cardiovascular: Negative for chest pain and palpitations.  Gastrointestinal: Positive for vomiting (from coughing hard). Negative for constipation and diarrhea.  Skin: Negative for rash.  Psychiatric/Behavioral: Positive for sleep disturbance.    Patient Active Problem List   Diagnosis Date Noted  . Spasm of muscle of lower back 05/11/2017  . Fibroma of foot 03/03/2016  . Neuropathic pain of finger 03/03/2016  . Diabetes mellitus type 2, controlled, without complications ([place]) 01/75/1025  . OSA on CPAP 09/22/2015  . Intermittent palpitations 08/28/2015  . GERD (gastroesophageal reflux disease) 08/28/2015  . Insomnia 08/28/2015  . Internal hemorrhoids 08/28/2015  . Vitamin D deficiency 08/28/2015  . Obesity, Class III, BMI 40-49.9 (morbid obesity) ([place]) 08/27/2015  . Hypertension 02/14/2014  . Benign paroxysmal positional nystagmus 02/14/2014  . 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 Bipolar 1 disorder ([place]) 08/14/2012  . Adult hypothyroidism 06/21/2012  .  Bilateral polycystic ovarian syndrome 06/21/2012    Prior to Admission medications   Medication Sig Start Date End Date Taking? Authorizing Provider  albuterol (PROVENTIL HFA;VENTOLIN HFA) 108 (90 Base) MCG/ACT inhaler Inhale 2 puffs into the lungs every 4 (four) hours as needed for wheezing or shortness of breath. 05/11/17  Yes [name], MD  Ascorbic Acid (VITAMIN C) 1000 MG tablet Take 1,000 mg by mouth daily.   Yes [provider]  benzonatate (TESSALON) 100 MG capsule Take 1 capsule (100 mg total) by mouth 3 (three) times daily as needed for cough. 05/01/17  Yes Cook, Jayce G, DO  chlorpheniramine-HYDROcodone (TUSSIONEX PENNKINETIC ER) 10-8 MG/5ML SUER Take 5 mLs by mouth every 12 (twelve) hours as needed. 05/01/17  Yes Coral Spikes, DO  Cholecalciferol 5000 units capsule Take 1 capsule (5,000 Units total) by mouth daily. 11/28/15  Yes Plonk, Gwyndolyn Saxon, MD  cyclobenzaprine (FEXMID) 7.5 MG tablet Take 7.5 mg 3 (three) times daily as needed by mouth for muscle spasms.   Yes [provider]  doxycycline (VIBRA-TABS) 100 MG tablet Take 1 tablet (100 mg total) by mouth 2 (two) times daily. 05/11/17  Yes [name], MD  hydrochlorothiazide (HYDRODIURIL) 25 MG tablet Take 1 tablet (25 mg total) by mouth daily. 05/11/17  Yes [name], MD  ibuprofen (ADVIL,MOTRIN) 800 MG tablet Take 1 tablet (800 mg total) by mouth every 8 (eight) hours as needed. 06/10/16  Yes Melynda Ripple, MD  JANUVIA 100 MG tablet TAKE 1 TABLET (100 MG TOTAL) BY MOUTH DAILY. 10/14/16  Yes [name], MD  lamoTRIgine (LAMICTAL) 100 MG tablet Take 300 mg by mouth daily.  11/28/15  Yes [provider]  levothyroxine (SYNTHROID, LEVOTHROID) 175 MCG tablet TAKE 1 TABLET (175 MCG TOTAL) BY MOUTH DAILY. 05/20/17  Yes [name], MD  losartan (COZAAR) 50 MG tablet TAKE 1 TABLET (50 MG TOTAL) BY MOUTH 2 (TWO) TIMES DAILY. 10/15/16  Yes [name], MD  Melatonin 5 MG CAPS  Take 15 mg by mouth Nightly.    Yes [provider]  metFORMIN (GLUCOPHAGE) 500 MG tablet Take 1 tablet (500 mg total) by mouth 2 (two) times daily with a meal. 01/13/17  Yes [name], MD  Multiple Vitamin (MULTIVITAMIN) tablet Take 1 tablet by mouth daily.   Yes [provider]  Omega-3 Fatty Acids (FISH OIL) 1200 MG CAPS Take by mouth.   Yes [provider]  ranitidine (ZANTAC) 150 MG tablet Take 1 tablet (150 mg total) by mouth 2 (two) times daily. 04/14/17  Yes [name], MD    Allergies  Allergen Reactions  . Lisinopril Cough  . Latex Rash    Condoms only    Past Surgical History:  Procedure Laterality Date  . ABDOMINAL HYSTERECTOMY  2012   cervical dysplasia/ovaries remian  . DENTAL SURGERY    . GALLBLADDER SURGERY      Social History   Tobacco Use  . Smoking status: Former Smoker    Packs/day: 1.00    Years: 10.00    Pack years: 10.00    Types: Cigarettes    Last attempt to quit: 07/12/2002    Years since quitting: 14.8  . Smokeless tobacco: Never Used  . Tobacco comment: quit 2004  Substance Use Topics  . Alcohol use: Yes    Alcohol/week: 0.0 oz    Comment: rarely  . Drug use: No     Medication list has been reviewed and updated.  PHQ 2/9 Scores 04/14/2017 12/19/2015 11/27/2015 08/27/2015  PHQ - 2 Score 0 0 - -  Exception Documentation - - Other- indicate reason in comment box Other- indicate reason in comment box  Not completed - - psychiatry appt next week Follows Psych    Physical Exam  Constitutional: She is oriented to person, place, and time. She appears well-developed. No distress.  HENT:  Head: Normocephalic and atraumatic.  Mouth/Throat: No posterior oropharyngeal erythema.  Cardiovascular: Normal rate, regular rhythm and normal heart sounds.  Pulmonary/Chest: Effort normal. No respiratory distress. She has decreased breath sounds. She has no wheezes. She has no rhonchi.  Musculoskeletal: Normal range of  motion.  Neurological: She is alert and oriented to person, place, and time.  Skin: Skin is warm and dry. No rash noted.  Psychiatric: She has a normal mood and affect. Her behavior is normal. Thought content normal.  Nursing note and vitals reviewed.   BP 124/70   Pulse 96   Temp 98.7 F (37.1 C) (Oral)   Ht 5\' 6"  (1.676 m)   Wt 283 lb (128.4 kg)   SpO2 97%   BMI 45.68 kg/m   Assessment and Plan: 1. Bronchitis Change therapy and add flonase for PND triggered cough/gag - chlorpheniramine-HYDROcodone (TUSSIONEX PENNKINETIC ER) 10-8 MG/5ML SUER; Take 5 mLs 2 (two) times daily by mouth.  Dispense: 115 mL; Refill: 0 - amoxicillin-clavulanate (AUGMENTIN) 875-125 MG tablet; Take 1 tablet 2 (two) times daily by mouth.  Dispense: 20 tablet; Refill: 0 - fluticasone (FLONASE) 50 MCG/ACT nasal spray; Place 2 sprays daily into both nostrils.  Dispense: 16 g; Refill: 1   Meds ordered this encounter  Medications  .  chlorpheniramine-HYDROcodone (TUSSIONEX PENNKINETIC ER) 10-8 MG/5ML SUER    Sig: Take 5 mLs 2 (two) times daily by mouth.    Dispense:  115 mL    Refill:  0  . amoxicillin-clavulanate (AUGMENTIN) 875-125 MG tablet    Sig: Take 1 tablet 2 (two) times daily by mouth.    Dispense:  20 tablet    Refill:  0  . fluticasone (FLONASE) 50 MCG/ACT nasal spray    Sig: Place 2 sprays daily into both nostrils.    Dispense:  16 g    Refill:  1    Partially dictated using Editor, commissioning. Any errors are unintentional.  Halina Maidens, MD [facility]  05/20/2017

## 2017-05-21 ENCOUNTER — Encounter: Payer: Self-pay | Admitting: Internal Medicine

## 2017-05-23 ENCOUNTER — Other Ambulatory Visit: Payer: Self-pay | Admitting: Internal Medicine

## 2017-05-23 MED ORDER — ONDANSETRON 4 MG PO TBDP: 4.0000 mg | ORAL_TABLET | Freq: Three times a day (TID) | ORAL | 0 refills | Status: DC | PRN

## 2017-05-23 MED ORDER — AZITHROMYCIN 200 MG/5ML PO SUSR: 250.0000 mg | Freq: Every day | ORAL | 0 refills | Status: AC

## 2017-05-23 NOTE — Telephone Encounter (Signed)
Spoke with you verbally- will inform patient you will switch her to different antibiotic and give her zofran for nausea.

## 2017-06-08 ENCOUNTER — Encounter: Payer: Self-pay | Admitting: Internal Medicine

## 2017-06-08 ENCOUNTER — Ambulatory Visit: Payer: BLUE CROSS/BLUE SHIELD | Admitting: Internal Medicine

## 2017-06-08 VITALS — BP 142/98 | HR 84 | Temp 98.3°F | Ht 66.0 in | Wt 289.0 lb

## 2017-06-08 DIAGNOSIS — N760 Acute vaginitis: Secondary | ICD-10-CM

## 2017-06-08 DIAGNOSIS — J40 Bronchitis, not specified as acute or chronic: Secondary | ICD-10-CM

## 2017-06-08 MED ORDER — HYDROCOD POLST-CPM POLST ER 10-8 MG/5ML PO SUER: 5.0000 mL | Freq: Two times a day (BID) | ORAL | 0 refills | Status: DC

## 2017-06-08 MED ORDER — FLUCONAZOLE 100 MG PO TABS: 100.0000 mg | ORAL_TABLET | Freq: Once | ORAL | 0 refills | Status: AC

## 2017-06-08 NOTE — Progress Notes (Signed)
Date:  06/08/2017   Name:  Lori Knight   DOB:  1971/05/21   MRN:  [id number]   Chief Complaint: Bronchitis (Follow up. Better but not great. Cough is still holding on second half of the day. Not peeing or vomiting on self anymore due to cough. Would like different antibiotic and more cough medicine. Would like diflucan for one day to keep from getting another yeast infection.)  Cough  This is a new problem. The current episode started 1 to 4 weeks ago. The problem has been gradually improving. The problem occurs hourly. The cough is non-productive. Pertinent negatives include no chest pain, fever, sore throat or wheezing. The symptoms are aggravated by lying down and exercise. She has tried prescription cough suppressant (and doxy then zithromax) for the symptoms. The treatment provided moderate relief.  She took 1/2 of the Zpak course and then finished the Doxycycline.  Vaginitis - has had some itching since taking antibiotics.  She took one diflucan and improved but has sx again.  Review of Systems  Constitutional: Negative for fatigue and fever.  HENT: Negative for dental problem, sore throat and trouble swallowing.   Respiratory: Positive for cough. Negative for chest tightness and wheezing.   Cardiovascular: Negative for chest pain.  Gastrointestinal: Negative for constipation, diarrhea, nausea and vomiting.  Genitourinary: Negative for vaginal bleeding and vaginal discharge.       Vaginal itching    Patient Active Problem List   Diagnosis Date Noted  . Spasm of muscle of lower back 05/11/2017  . Fibroma of foot 03/03/2016  . Neuropathic pain of finger 03/03/2016  . Diabetes mellitus type 2, controlled, without complications ([place]) 69/67/8938  . OSA on CPAP 09/22/2015  . Intermittent palpitations 08/28/2015  . GERD (gastroesophageal reflux disease) 08/28/2015  . Insomnia 08/28/2015  . Internal hemorrhoids 08/28/2015  . Vitamin D deficiency 08/28/2015  . Obesity, Class  III, BMI 40-49.9 (morbid obesity) ([place]) 08/27/2015  . Hypertension 02/14/2014  . Benign paroxysmal positional nystagmus 02/14/2014  . 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 Bipolar 1 disorder ([place]) 08/14/2012  . Adult hypothyroidism 06/21/2012  . Bilateral polycystic ovarian syndrome 06/21/2012    Prior to Admission medications   Medication Sig Start Date End Date Taking? Authorizing Provider  albuterol (PROVENTIL HFA;VENTOLIN HFA) 108 (90 Base) MCG/ACT inhaler Inhale 2 puffs into the lungs every 4 (four) hours as needed for wheezing or shortness of breath. 05/11/17   [name], MD  Ascorbic Acid (VITAMIN C) 1000 MG tablet Take 1,000 mg by mouth daily.    [provider]  chlorpheniramine-HYDROcodone (TUSSIONEX PENNKINETIC ER) 10-8 MG/5ML SUER Take 5 mLs 2 (two) times daily by mouth. 05/20/17   [name], MD  Cholecalciferol 5000 units capsule Take 1 capsule (5,000 Units total) by mouth daily. 11/28/15   Plonk, Gwyndolyn Saxon, MD  cyclobenzaprine (FEXMID) 7.5 MG tablet Take 7.5 mg 3 (three) times daily as needed by mouth for muscle spasms.    [provider]  fluticasone (FLONASE) 50 MCG/ACT nasal spray Place 2 sprays daily into both nostrils. 05/20/17   [name], MD  hydrochlorothiazide (HYDRODIURIL) 25 MG tablet Take 1 tablet (25 mg total) by mouth daily. 05/11/17   [name], MD  ibuprofen (ADVIL,MOTRIN) 800 MG tablet Take 1 tablet (800 mg total) by mouth every 8 (eight) hours as needed. 06/10/16   Melynda Ripple, MD  JANUVIA 100 MG tablet TAKE 1 TABLET (100 MG TOTAL) BY MOUTH DAILY. 10/14/16  [name], MD  lamoTRIgine (LAMICTAL) 100 MG tablet Take 300 mg by mouth daily.  11/28/15   [provider]  levothyroxine (SYNTHROID, LEVOTHROID) 175 MCG tablet TAKE 1 TABLET (175 MCG TOTAL) BY MOUTH DAILY. 05/20/17   [name], MD  losartan (COZAAR) 50 MG tablet TAKE 1 TABLET (50 MG TOTAL) BY MOUTH 2 (TWO) TIMES DAILY. 10/15/16    [name], MD  Melatonin 5 MG CAPS Take 15 mg by mouth Nightly.     [provider]  metFORMIN (GLUCOPHAGE) 500 MG tablet Take 1 tablet (500 mg total) by mouth 2 (two) times daily with a meal. 01/13/17   [name], MD  Multiple Vitamin (MULTIVITAMIN) tablet Take 1 tablet by mouth daily.    [provider]  Omega-3 Fatty Acids (FISH OIL) 1200 MG CAPS Take by mouth.    [provider]  ondansetron (ZOFRAN-ODT) 4 MG disintegrating tablet Take 1 tablet (4 mg total) every 8 (eight) hours as needed by mouth for nausea or vomiting. 05/23/17   [name], MD  ranitidine (ZANTAC) 150 MG tablet Take 1 tablet (150 mg total) by mouth 2 (two) times daily. 04/14/17   [name], MD    Allergies  Allergen Reactions  . Lisinopril Cough  . Latex Rash    Condoms only    Past Surgical History:  Procedure Laterality Date  . ABDOMINAL HYSTERECTOMY  2012   cervical dysplasia/ovaries remian  . COLONOSCOPY WITH PROPOFOL N/A 06/19/2015   Procedure: COLONOSCOPY WITH PROPOFOL;  Surgeon: [name], MD;  Location: [facility];  Service: Endoscopy;  Laterality: N/A;  Diabetic - oral meds   . DENTAL SURGERY    . GALLBLADDER SURGERY      Social History   Tobacco Use  . Smoking status: Former Smoker    Packs/day: 1.00    Years: 10.00    Pack years: 10.00    Types: Cigarettes    Last attempt to quit: 07/12/2002    Years since quitting: 14.9  . Smokeless tobacco: Never Used  . Tobacco comment: quit 2004  Substance Use Topics  . Alcohol use: Yes    Alcohol/week: 0.0 oz    Comment: rarely  . Drug use: No     Medication list has been reviewed and updated.  PHQ 2/9 Scores 04/14/2017 12/19/2015 11/27/2015 08/27/2015  PHQ - 2 Score 0 0 - -  Exception Documentation - - Other- indicate reason in comment box Other- indicate reason in comment box  Not completed - - psychiatry appt next week Follows Psych    Physical Exam  Constitutional: She is  oriented to person, place, and time. She appears well-developed. No distress.  HENT:  Head: Normocephalic and atraumatic.  Neck: Normal range of motion. No thyromegaly present.  Cardiovascular: Normal rate, regular rhythm and normal heart sounds.  Pulmonary/Chest: Effort normal and breath sounds normal. No respiratory distress. She has no wheezes. She has no rales.  Musculoskeletal: Normal range of motion.  Neurological: She is alert and oriented to person, place, and time.  Skin: Skin is warm and dry. No rash noted.  Psychiatric: She has a normal mood and affect. Her behavior is normal. Thought content normal.  Nursing note and vitals reviewed.   BP (!) 142/98   Pulse 84   Temp 98.3 F (36.8 C) (Oral)   Ht 5\' 6"  (1.676 m)   Wt 289 lb (131.1 kg)   SpO2 97%   BMI 46.65 kg/m  Assessment and Plan: 1. Bronchitis Much improved - no further antibiotics are needed - chlorpheniramine-HYDROcodone (TUSSIONEX PENNKINETIC ER) 10-8 MG/5ML SUER; Take 5 mLs by mouth 2 (two) times daily.  Dispense: 115 mL; Refill: 0  2. Acute vaginitis - fluconazole (DIFLUCAN) 100 MG tablet; Take 1 tablet (100 mg total) by mouth once for 1 dose.  Dispense: 2 tablet; Refill: 0   Meds ordered this encounter  Medications  . chlorpheniramine-HYDROcodone (TUSSIONEX PENNKINETIC ER) 10-8 MG/5ML SUER    Sig: Take 5 mLs by mouth 2 (two) times daily.    Dispense:  115 mL    Refill:  0  . fluconazole (DIFLUCAN) 100 MG tablet    Sig: Take 1 tablet (100 mg total) by mouth once for 1 dose.    Dispense:  2 tablet    Refill:  0    Partially dictated using Editor, commissioning. Any errors are unintentional.  Halina Maidens, MD [facility]  06/08/2017

## 2017-06-14 ENCOUNTER — Other Ambulatory Visit: Payer: Self-pay | Admitting: Internal Medicine

## 2017-06-15 ENCOUNTER — Other Ambulatory Visit: Payer: Self-pay | Admitting: Internal Medicine

## 2017-06-27 ENCOUNTER — Telehealth: Payer: Self-pay

## 2017-06-27 ENCOUNTER — Other Ambulatory Visit: Payer: Self-pay | Admitting: Internal Medicine

## 2017-06-27 DIAGNOSIS — J40 Bronchitis, not specified as acute or chronic: Secondary | ICD-10-CM

## 2017-06-27 MED ORDER — HYDROCOD POLST-CPM POLST ER 10-8 MG/5ML PO SUER: 5.0000 mL | Freq: Two times a day (BID) | ORAL | 0 refills | Status: DC

## 2017-06-27 NOTE — Telephone Encounter (Signed)
Rx written - she can pick it up.

## 2017-06-27 NOTE — Telephone Encounter (Signed)
Patient called stating cough is still lingering. During the day it is fine but 4pm and after she is coughing so bad she is not sleeping. She is now out of cough syrup. Wants to know if she can have refill on this and pick it up tomorrow. If she needs an appt tomorrow she will come in for that tomorrow if needed. Please Advise.

## 2017-06-27 NOTE — Telephone Encounter (Signed)
Patient informed. 

## 2017-07-12 DIAGNOSIS — D239 Other benign neoplasm of skin, unspecified: Secondary | ICD-10-CM

## 2017-07-12 HISTORY — DX: Other benign neoplasm of skin, unspecified: D23.9

## 2017-07-13 ENCOUNTER — Other Ambulatory Visit: Payer: Self-pay | Admitting: Internal Medicine

## 2017-07-13 DIAGNOSIS — J40 Bronchitis, not specified as acute or chronic: Secondary | ICD-10-CM

## 2017-07-20 ENCOUNTER — Other Ambulatory Visit: Payer: Self-pay

## 2017-07-20 DIAGNOSIS — E119 Type 2 diabetes mellitus without complications: Secondary | ICD-10-CM

## 2017-08-10 ENCOUNTER — Other Ambulatory Visit: Payer: Self-pay

## 2017-08-10 LAB — HEMOGLOBIN A1C
ESTIMATED AVERAGE GLUCOSE: 169 mg/dL
Hgb A1c MFr Bld: 7.5 % — ABNORMAL HIGH (ref 4.8–5.6)

## 2017-08-10 MED ORDER — METFORMIN HCL 500 MG PO TABS: 500.0000 mg | ORAL_TABLET | Freq: Three times a day (TID) | ORAL | 0 refills | Status: DC

## 2017-09-05 ENCOUNTER — Other Ambulatory Visit: Payer: Self-pay | Admitting: Internal Medicine

## 2017-09-11 ENCOUNTER — Ambulatory Visit
Admission: EM | Admit: 2017-09-11 | Discharge: 2017-09-11 | Disposition: A | Discharge status: Home / Self Care | Payer: BLUE CROSS/BLUE SHIELD | Attending: Family Medicine | Admitting: Family Medicine

## 2017-09-11 ENCOUNTER — Other Ambulatory Visit: Payer: Self-pay | Admitting: Internal Medicine

## 2017-09-11 ENCOUNTER — Other Ambulatory Visit: Payer: Self-pay

## 2017-09-11 DIAGNOSIS — R059 Cough, unspecified: Secondary | ICD-10-CM

## 2017-09-11 DIAGNOSIS — R05 Cough: Secondary | ICD-10-CM

## 2017-09-11 MED ORDER — AZITHROMYCIN 250 MG PO TABS: ORAL_TABLET | ORAL | 0 refills | Status: DC

## 2017-09-11 MED ORDER — HYDROCOD POLST-CPM POLST ER 10-8 MG/5ML PO SUER: 5.0000 mL | Freq: Two times a day (BID) | ORAL | 0 refills | Status: DC | PRN

## 2017-09-11 NOTE — ED Triage Notes (Signed)
Pt reports she gets bronchitis quite often and this feels similar, but worse. Has burning in chest with cough. Just off a cruise ship to [place] and [place]. Pain only when coughing

## 2017-09-11 NOTE — ED Provider Notes (Addendum)
[facility]    CSN: [id number] Arrival date & time: 09/11/17  0813     History   Chief Complaint Chief Complaint  Patient presents with  . Cough    HPI Lori Knight is a 47 y.o. female.   Patient states last time she was here, she was told she had a viral URI and ended up having to go see her PCP to get 2 rounds antibiotics for "bronchitis". States she feels she has bronchitis now again.    The history is provided by the patient.  Cough  Associated symptoms: fever and rhinorrhea   Associated symptoms: no wheezing   URI  Presenting symptoms: congestion, cough, fever and rhinorrhea   Severity:  Moderate Duration:  3 days Timing:  Constant Progression:  Worsening Chronicity:  New Relieved by:  Nothing Ineffective treatments:  OTC medications Associated symptoms: no wheezing   Risk factors: diabetes mellitus, recent travel and sick contacts   Risk factors: not elderly, no chronic cardiac disease and no chronic kidney disease     Past Medical History:  Diagnosis Date  . Adult hypothyroidism 06/21/2012  . Bilateral polycystic ovarian syndrome 06/21/2012  . Biliary calculus with cholecystitis 08/14/2012  . Bipolar 1 disorder ([place])   . BP (high blood pressure) 02/14/2014  . Diabetes mellitus ([place]) 08/14/2012  . Diabetes mellitus without complication ([place])    pre-diabetic  . Dysrhythmia    wore heart monitor 2016. Corrected by changing Levothyroxine dose.  Marland Kitchen Heart murmur    followed by PCP  . Hyperprolactinemia ([place]) 06/21/2012  . Hypertension   . Motion sickness    carnival rides  . PONV (postoperative nausea and vomiting)   . Shortness of breath dyspnea    stairs. related to wt.  . Sleep apnea    has CPAP. has not used since 2011  . Vertigo    2x in last yr    Patient Active Problem List   Diagnosis Date Noted  . Spasm of muscle of lower back 05/11/2017  . Fibroma of foot 03/03/2016  . Neuropathic pain of finger 03/03/2016  . Diabetes  mellitus type 2, controlled, without complications ([place]) 40/34/7425  . OSA on CPAP 09/22/2015  . Intermittent palpitations 08/28/2015  . GERD (gastroesophageal reflux disease) 08/28/2015  . Insomnia 08/28/2015  . Internal hemorrhoids 08/28/2015  . Vitamin D deficiency 08/28/2015  . Obesity, Class III, BMI 40-49.9 (morbid obesity) ([place]) 08/27/2015  . Hypertension 02/14/2014  . Benign paroxysmal positional nystagmus 02/14/2014  . 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 Bipolar 1 disorder ([place]) 08/14/2012  . Adult hypothyroidism 06/21/2012  . Bilateral polycystic ovarian syndrome 06/21/2012    Past Surgical History:  Procedure Laterality Date  . ABDOMINAL HYSTERECTOMY  2012   cervical dysplasia/ovaries remian  . COLONOSCOPY WITH PROPOFOL N/A 06/19/2015   Procedure: COLONOSCOPY WITH PROPOFOL;  Surgeon: [name], MD;  Location: [facility];  Service: Endoscopy;  Laterality: N/A;  Diabetic - oral meds   . DENTAL SURGERY    . GALLBLADDER SURGERY      OB History    No data available       Home Medications    Prior to Admission medications   Medication Sig Start Date End Date Taking? Authorizing Provider  albuterol (PROVENTIL HFA;VENTOLIN HFA) 108 (90 Base) MCG/ACT inhaler Inhale 2 puffs into the lungs every 4 (four) hours as needed for wheezing or shortness of breath. 05/11/17   [name], MD  Ascorbic Acid (VITAMIN C) 1000 MG  tablet Take 1,000 mg by mouth daily.    [provider]  azithromycin (ZITHROMAX Z-PAK) 250 MG tablet 2 tabs po once day 1, then 1 tab po qd for next 4 days 09/11/17   Norval Gable, MD  chlorpheniramine-HYDROcodone [facility] PENNKINETIC ER) 10-8 MG/5ML SUER Take 5 mLs by mouth every 12 (twelve) hours as needed. 09/11/17   Norval Gable, MD  Cholecalciferol 5000 units capsule Take 1 capsule (5,000 Units total) by mouth daily. 11/28/15   Plonk, Gwyndolyn Saxon, MD  cyclobenzaprine (FEXMID) 7.5 MG tablet Take 7.5 mg 3 (three) times daily as  needed by mouth for muscle spasms.    [provider]  fluticasone (FLONASE) 50 MCG/ACT nasal spray PLACE 2 SPRAYS DAILY INTO BOTH NOSTRILS. 07/14/17   [name], MD  hydrochlorothiazide (HYDRODIURIL) 25 MG tablet Take 1 tablet (25 mg total) by mouth daily. 05/11/17   [name], MD  ibuprofen (ADVIL,MOTRIN) 800 MG tablet Take 1 tablet (800 mg total) by mouth every 8 (eight) hours as needed. 06/10/16   Melynda Ripple, MD  JANUVIA 100 MG tablet TAKE 1 TABLET (100 MG TOTAL) BY MOUTH DAILY. 06/16/17   [name], MD  lamoTRIgine (LAMICTAL) 100 MG tablet Take 300 mg by mouth daily.  11/28/15   [provider]  levothyroxine (SYNTHROID, LEVOTHROID) 175 MCG tablet TAKE 1 TABLET (175 MCG TOTAL) BY MOUTH DAILY. 09/05/17   [name], MD  losartan (COZAAR) 50 MG tablet TAKE 1 TABLET (50 MG TOTAL) BY MOUTH 2 (TWO) TIMES DAILY. 06/14/17   [name], MD  Melatonin 5 MG CAPS Take 15 mg by mouth Nightly.     [provider]  metFORMIN (GLUCOPHAGE) 500 MG tablet Take 1 tablet (500 mg total) by mouth 3 (three) times daily. 08/10/17   [name], MD  Multiple Vitamin (MULTIVITAMIN) tablet Take 1 tablet by mouth daily.    [provider]  Omega-3 Fatty Acids (FISH OIL) 1200 MG CAPS Take by mouth.    [provider]  ondansetron (ZOFRAN-ODT) 4 MG disintegrating tablet Take 1 tablet (4 mg total) every 8 (eight) hours as needed by mouth for nausea or vomiting. 05/23/17   [name], MD  ranitidine (ZANTAC) 150 MG tablet Take 1 tablet (150 mg total) by mouth 2 (two) times daily. 04/14/17   [name], MD    Family History Family History  Problem Relation Age of Onset  . Diabetes Maternal Grandmother   . Hypertension Maternal Grandmother   . Hyperlipidemia Maternal Grandmother   . Hypothyroidism Maternal Grandmother   . Diabetes Mother   . Hypertension Mother   . Hypothyroidism Mother   . Bipolar disorder Mother   .  Breast cancer Neg Hx     Social History Social History   Tobacco Use  . Smoking status: Former Smoker    Packs/day: 1.00    Years: 10.00    Pack years: 10.00    Types: Cigarettes    Last attempt to quit: 07/12/2002    Years since quitting: 15.1  . Smokeless tobacco: Never Used  . Tobacco comment: quit 2004  Substance Use Topics  . Alcohol use: Yes    Alcohol/week: 0.0 oz    Comment: rarely  . Drug use: No     Allergies   Lisinopril and Latex   Review of Systems Review of Systems  Constitutional: Positive for fever.  HENT: Positive for congestion and rhinorrhea.   Respiratory: Positive for cough. Negative for wheezing.  Physical Exam Triage Vital Signs ED Triage Vitals  Enc Vitals Group     BP 09/11/17 0826 (!) 159/98     Pulse Rate 09/11/17 0826 100     Resp 09/11/17 0826 20     Temp 09/11/17 0826 (!) 100.6 F (38.1 C)     Temp Source 09/11/17 0826 Oral     SpO2 09/11/17 0826 95 %     Weight 09/11/17 0825 300 lb (136.1 kg)     Height 09/11/17 0825 5\' 6"  (1.676 m)     Head Circumference --      Peak Flow --      Pain Score 09/11/17 0912 0     Pain Loc --      Pain Edu? --      Excl. in [place]? --    No data found.  Updated Vital Signs BP (!) 159/98 (BP Location: Right Arm)   Pulse 100   Temp (!) 100.6 F (38.1 C) (Oral)   Resp 20   Ht 5\' 6"  (1.676 m)   Wt 300 lb (136.1 kg)   SpO2 95%   BMI 48.42 kg/m   Visual Acuity Right Eye Distance:   Left Eye Distance:   Bilateral Distance:    Right Eye Near:   Left Eye Near:    Bilateral Near:     Physical Exam  Constitutional: She appears well-developed and well-nourished. No distress.  HENT:  Head: Normocephalic and atraumatic.  Right Ear: Tympanic membrane, external ear and ear canal normal.  Left Ear: Tympanic membrane, external ear and ear canal normal.  Nose: Rhinorrhea present. No mucosal edema, nose lacerations, sinus tenderness, nasal deformity, septal deviation or nasal septal hematoma.  No epistaxis.  No foreign bodies. Right sinus exhibits no maxillary sinus tenderness and no frontal sinus tenderness. Left sinus exhibits no maxillary sinus tenderness and no frontal sinus tenderness.  Mouth/Throat: Uvula is midline, oropharynx is clear and moist and mucous membranes are normal. No oropharyngeal exudate.  Eyes: Conjunctivae are normal. Right eye exhibits no discharge. Left eye exhibits no discharge. No scleral icterus.  Neck: Normal range of motion. Neck supple. No thyromegaly present.  Cardiovascular: Normal rate, regular rhythm and normal heart sounds.  Pulmonary/Chest: Effort normal. No stridor. No respiratory distress. She has no wheezes. She has no rales.  rhonchi  Lymphadenopathy:    She has no cervical adenopathy.  Skin: She is not diaphoretic.  Nursing note and vitals reviewed.    UC Treatments / Results  Labs (all labs ordered are listed, but only abnormal results are displayed) Labs Reviewed - No data to display  EKG  EKG Interpretation None       Radiology No results found.  Procedures Procedures (including critical care time)  Medications Ordered in UC Medications - No data to display   Initial Impression / Assessment and Plan / UC Course  I have reviewed the triage vital signs and the nursing notes.  Pertinent labs & imaging results that were available during my care of the patient were reviewed by me and considered in my medical decision making (see chart for details).       Final Clinical Impressions(s) / UC Diagnoses   Final diagnoses:  Cough    ED Discharge Orders        Ordered    azithromycin (ZITHROMAX Z-PAK) 250 MG tablet     09/11/17 0910    chlorpheniramine-HYDROcodone (TUSSIONEX PENNKINETIC ER) 10-8 MG/5ML SUER  Every 12 hours PRN  09/11/17 0910     1. diagnosis reviewed with patient 2. rx as per orders above; reviewed possible side effects, interactions, risks and benefits; provided some education to patient  regarding antibiotic treatment and use  3. Recommend supportive treatment with rest, fluids, otc analgesics  4. Follow up with PCP 5. Follow-up prn if symptoms worsen or don't improve  Controlled Substance Prescriptions [place] Controlled Substance Registry consulted? Not Applicable   Norval Gable, MD 09/11/17 [place], MD 09/11/17 [phone]

## 2017-09-29 ENCOUNTER — Other Ambulatory Visit: Payer: Self-pay | Admitting: Internal Medicine

## 2017-09-29 ENCOUNTER — Encounter: Payer: Self-pay | Admitting: Internal Medicine

## 2017-09-29 ENCOUNTER — Ambulatory Visit (INDEPENDENT_AMBULATORY_CARE_PROVIDER_SITE_OTHER): Payer: Self-pay | Admitting: Internal Medicine

## 2017-09-29 VITALS — BP 130/82 | HR 93 | Temp 98.0°F | Ht 66.0 in | Wt 289.0 lb

## 2017-09-29 DIAGNOSIS — R0982 Postnasal drip: Secondary | ICD-10-CM

## 2017-09-29 DIAGNOSIS — R059 Cough, unspecified: Secondary | ICD-10-CM

## 2017-09-29 DIAGNOSIS — R05 Cough: Secondary | ICD-10-CM

## 2017-09-29 NOTE — Patient Instructions (Signed)
Resume Flonase nasal spray 2 sprays each nostril once a day  Allegra 180 mg or Claritin 10 mg once a day.

## 2017-09-29 NOTE — Progress Notes (Signed)
Date:  09/29/2017   Name:  Lori Knight   DOB:  09-01-1970   MRN:  [id number]   Chief Complaint: Cough (Came back from a cruise 09/12/2017. Coughing since then. Went to UC- got Zpack and cough medicine. Coughing so bad causing to vomit. X 1 week. Lastnight- could feel drainage in throat and caused to wake up in sleep and vomit. )  Cough  This is a new problem. The current episode started 1 to 4 weeks ago. The problem has been unchanged. The problem occurs every few hours. The cough is non-productive. Associated symptoms include postnasal drip. Pertinent negatives include no chest pain, chills, ear pain, fever, headaches, sore throat, shortness of breath or wheezing.   She feels that the chest congestion has resolved after Zpak.  She had been coughing less until about a week ago.  Now has PND triggering gag reflex and vomiting from cough.  She has been taking tussionex syrup fairly consistently since the beginning of the year.  When she does not take it, she feels that she has nausea and vomiting.  No abdominal pain or tremors.   Review of Systems  Constitutional: Negative for chills, fatigue and fever.  HENT: Positive for postnasal drip. Negative for ear pain, sinus pressure, sore throat and trouble swallowing.   Respiratory: Positive for cough. Negative for chest tightness, shortness of breath and wheezing.   Cardiovascular: Negative for chest pain and palpitations.  Gastrointestinal: Positive for vomiting. Negative for constipation and diarrhea.  Neurological: Negative for dizziness, tremors and headaches.    Patient Active Problem List   Diagnosis Date Noted  . Spasm of muscle of lower back 05/11/2017  . Fibroma of foot 03/03/2016  . Neuropathic pain of finger 03/03/2016  . Diabetes mellitus type 2, controlled, without complications ([place]) 60/04/9322  . OSA on CPAP 09/22/2015  . Intermittent palpitations 08/28/2015  . GERD (gastroesophageal reflux disease) 08/28/2015  .  Insomnia 08/28/2015  . Internal hemorrhoids 08/28/2015  . Vitamin D deficiency 08/28/2015  . Obesity, Class III, BMI 40-49.9 (morbid obesity) ([place]) 08/27/2015  . Hypertension 02/14/2014  . Benign paroxysmal positional nystagmus 02/14/2014  . 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 Bipolar 1 disorder ([place]) 08/14/2012  . Adult hypothyroidism 06/21/2012  . Bilateral polycystic ovarian syndrome 06/21/2012    Prior to Admission medications   Medication Sig Start Date End Date Taking? Authorizing Provider  albuterol (PROVENTIL HFA;VENTOLIN HFA) 108 (90 Base) MCG/ACT inhaler Inhale 2 puffs into the lungs every 4 (four) hours as needed for wheezing or shortness of breath. 05/11/17  Yes [name], MD  Ascorbic Acid (VITAMIN C) 1000 MG tablet Take 1,000 mg by mouth daily.   Yes [provider]  Cholecalciferol 5000 units capsule Take 1 capsule (5,000 Units total) by mouth daily. 11/28/15  Yes Plonk, Gwyndolyn Saxon, MD  cyclobenzaprine (FEXMID) 7.5 MG tablet Take 7.5 mg by mouth 3 (three) times daily as needed for muscle spasms (PRN).    Yes [provider]  diclofenac (VOLTAREN) 75 MG EC tablet Take 75 mg by mouth 2 (two) times daily. 08/23/17  Yes [provider]  fluticasone (FLONASE) 50 MCG/ACT nasal spray PLACE 2 SPRAYS DAILY INTO BOTH NOSTRILS. 07/14/17  Yes [name], MD  hydrochlorothiazide (HYDRODIURIL) 25 MG tablet Take 1 tablet (25 mg total) by mouth daily. 05/11/17  Yes [name], MD  ibuprofen (ADVIL,MOTRIN) 800 MG tablet Take 1 tablet (800 mg total) by mouth every 8 (eight) hours as needed. 06/10/16  Yes Melynda Ripple, MD  JANUVIA 100 MG tablet TAKE 1 TABLET (100 MG TOTAL) BY MOUTH DAILY. 06/16/17  Yes [name], MD  lamoTRIgine (LAMICTAL) 100 MG tablet Take 300 mg by mouth daily.  11/28/15  Yes [provider]  levothyroxine (SYNTHROID, LEVOTHROID) 175 MCG tablet TAKE 1 TABLET (175 MCG TOTAL) BY MOUTH DAILY. 09/05/17  Yes [name], MD  losartan (COZAAR) 50 MG tablet TAKE 1 TABLET (50 MG TOTAL) BY MOUTH 2 (TWO) TIMES DAILY. 06/14/17  Yes [name], MD  Melatonin 5 MG CAPS Take 15 mg by mouth Nightly.    Yes [provider]  metFORMIN (GLUCOPHAGE) 500 MG tablet Take 1 tablet (500 mg total) by mouth 3 (three) times daily. 08/10/17  Yes [name], MD  Multiple Vitamin (MULTIVITAMIN) tablet Take 1 tablet by mouth daily.   Yes [provider]  Omega-3 Fatty Acids (FISH OIL) 1200 MG CAPS Take by mouth.   Yes [provider]  ondansetron (ZOFRAN-ODT) 4 MG disintegrating tablet Take 1 tablet (4 mg total) every 8 (eight) hours as needed by mouth for nausea or vomiting. 05/23/17  Yes [name], MD  ranitidine (ZANTAC) 150 MG tablet Take 1 tablet (150 mg total) by mouth 2 (two) times daily. 04/14/17  Yes [name], MD    Allergies  Allergen Reactions  . Lisinopril Cough  . Latex Rash    Condoms only    Past Surgical History:  Procedure Laterality Date  . ABDOMINAL HYSTERECTOMY  2012   cervical dysplasia/ovaries remian  . COLONOSCOPY WITH PROPOFOL N/A 06/19/2015   Procedure: COLONOSCOPY WITH PROPOFOL;  Surgeon: [name], MD;  Location: [facility];  Service: Endoscopy;  Laterality: N/A;  Diabetic - oral meds   . DENTAL SURGERY    . GALLBLADDER SURGERY      Social History   Tobacco Use  . Smoking status: Former Smoker    Packs/day: 1.00    Years: 10.00    Pack years: 10.00    Types: Cigarettes    Last attempt to quit: 07/12/2002    Years since quitting: 15.2  . Smokeless tobacco: Never Used  . Tobacco comment: quit 2004  Substance Use Topics  . Alcohol use: Yes    Alcohol/week: 0.0 oz    Comment: rarely  . Drug use: No     Medication list has been reviewed and updated.  PHQ 2/9 Scores 04/14/2017 12/19/2015 11/27/2015 08/27/2015  PHQ - 2 Score 0 0 - -  Exception Documentation - - Other- indicate reason in comment box Other- indicate reason  in comment box  Not completed - - psychiatry appt next week Follows Psych    Physical Exam  Constitutional: She is oriented to person, place, and time. She appears well-developed. No distress.  HENT:  Head: Normocephalic and atraumatic.  Right Ear: Tympanic membrane and ear canal normal.  Left Ear: Tympanic membrane and ear canal normal.  Mouth/Throat: No oropharyngeal exudate, posterior oropharyngeal edema or posterior oropharyngeal erythema.  Neck: Normal range of motion. Neck supple. Carotid bruit is not present.  Cardiovascular: Normal rate, regular rhythm and normal heart sounds.  Pulmonary/Chest: Effort normal and breath sounds normal. No respiratory distress. She has no wheezes. She has no rales.  Musculoskeletal: Normal range of motion.  Neurological: She is alert and oriented to person, place, and time.  Skin: Skin is warm and dry. No rash noted.  Psychiatric: She has a normal mood and affect. Her behavior is  normal. Thought content normal.  Nursing note and vitals reviewed.   BP 130/82   Pulse 93   Temp 98 F (36.7 C) (Oral)   Ht 5\' 6"  (1.676 m)   Wt 289 lb (131.1 kg)   SpO2 97%   BMI 46.65 kg/m   Assessment and Plan: 1. Post-nasal drainage Resume flonase and Allegra  2. Cough Due to PND with strong gag reflex causing vomiting Need to stop narcotic cough syrup due to concerns for dependency Treatment of PND should be sufficient   No orders of the defined types were placed in this encounter.   Partially dictated using Editor, commissioning. Any errors are unintentional.  Halina Maidens, MD [facility]  09/29/2017

## 2017-10-27 ENCOUNTER — Other Ambulatory Visit: Payer: Self-pay | Admitting: Internal Medicine

## 2017-10-27 DIAGNOSIS — K219 Gastro-esophageal reflux disease without esophagitis: Secondary | ICD-10-CM

## 2017-11-01 ENCOUNTER — Other Ambulatory Visit: Payer: Self-pay | Admitting: Internal Medicine

## 2017-11-03 DIAGNOSIS — F3181 Bipolar II disorder: Secondary | ICD-10-CM | POA: Diagnosis not present

## 2017-11-05 ENCOUNTER — Other Ambulatory Visit: Payer: Self-pay | Admitting: Internal Medicine

## 2017-11-09 ENCOUNTER — Ambulatory Visit: Payer: BLUE CROSS/BLUE SHIELD | Admitting: Internal Medicine

## 2017-11-10 ENCOUNTER — Ambulatory Visit: Payer: Self-pay | Admitting: Internal Medicine

## 2017-12-16 ENCOUNTER — Other Ambulatory Visit: Payer: Self-pay

## 2017-12-26 ENCOUNTER — Ambulatory Visit: Payer: BLUE CROSS/BLUE SHIELD | Admitting: Internal Medicine

## 2017-12-26 ENCOUNTER — Encounter: Payer: Self-pay | Admitting: Internal Medicine

## 2017-12-26 VITALS — BP 138/84 | HR 85 | Temp 98.4°F | Resp 16 | Ht 66.0 in | Wt 292.0 lb

## 2017-12-26 DIAGNOSIS — E039 Hypothyroidism, unspecified: Secondary | ICD-10-CM | POA: Diagnosis not present

## 2017-12-26 DIAGNOSIS — M722 Plantar fascial fibromatosis: Secondary | ICD-10-CM | POA: Diagnosis not present

## 2017-12-26 DIAGNOSIS — R05 Cough: Secondary | ICD-10-CM

## 2017-12-26 DIAGNOSIS — R059 Cough, unspecified: Secondary | ICD-10-CM | POA: Insufficient documentation

## 2017-12-26 DIAGNOSIS — I10 Essential (primary) hypertension: Secondary | ICD-10-CM | POA: Diagnosis not present

## 2017-12-26 DIAGNOSIS — Z1231 Encounter for screening mammogram for malignant neoplasm of breast: Secondary | ICD-10-CM

## 2017-12-26 DIAGNOSIS — E119 Type 2 diabetes mellitus without complications: Secondary | ICD-10-CM

## 2017-12-26 MED ORDER — BENZONATATE 200 MG PO CAPS: 200.0000 mg | ORAL_CAPSULE | Freq: Every day | ORAL | 0 refills | Status: DC

## 2017-12-26 MED ORDER — MONTELUKAST SODIUM 10 MG PO TABS: 10.0000 mg | ORAL_TABLET | Freq: Every day | ORAL | 3 refills | Status: DC

## 2017-12-26 NOTE — Progress Notes (Signed)
Date:  12/26/2017   Name:  Lori Knight   DOB:  29-Sep-1970   MRN:  [id number]   Chief Complaint: Diabetes and URI (constant infection ) Diabetes  She presents for her follow-up diabetic visit. She has type 2 diabetes mellitus. Her disease course has been fluctuating. Pertinent negatives for hypoglycemia include no dizziness, headaches or nervousness/anxiousness. Associated symptoms include blurred vision and fatigue. Pertinent negatives for diabetes include no chest pain, no foot ulcerations, no polyphagia, no weakness and no weight loss. Current diabetic treatment includes oral agent (dual therapy) Celesta Gentile, metformin). Her home blood glucose trend is fluctuating dramatically. (Range from 80 to 320)  Hypertension  This is a chronic problem. The problem is controlled. Associated symptoms include blurred vision. Pertinent negatives include no chest pain, headaches or palpitations. Past treatments include angiotensin blockers. Identifiable causes of hypertension include a thyroid problem.  Cough  This is a recurrent problem. The problem has been unchanged. The cough is non-productive. Associated symptoms include heartburn and postnasal drip. Pertinent negatives include no chest pain, chills, fever, headaches, rash, weight loss or wheezing. Treatments tried: claritin not tolerated, flonase taste bothersome. The treatment provided no relief.  Thyroid Problem  Presents for follow-up visit. Symptoms include fatigue. Patient reports no anxiety, diaphoresis, palpitations, weight gain or weight loss. The symptoms have been stable.  OSA - She is not sleeping well due to cough.  Using flonase every few days.  Unable to tolerate CPAP due to cough and therefore tired every day and feels that her glucoses are suffering, as is her mental health. Foot pain - had pain on her right heel near the arch.  She denies injury.  Thinks it may be plantar fasciitis.  She has not been taking any anti-inflammatory  medications.  Lab Results  Component Value Date   HGBA1C 7.5 (H) 08/09/2017   Lab Results  Component Value Date   TSH 1.630 09/02/2016     Review of Systems  Constitutional: Positive for fatigue. Negative for chills, diaphoresis, fever, unexpected weight change, weight gain and weight loss.  HENT: Positive for postnasal drip.   Eyes: Positive for blurred vision.  Respiratory: Positive for cough. Negative for chest tightness and wheezing.   Cardiovascular: Negative for chest pain, palpitations and leg swelling.  Gastrointestinal: Positive for heartburn.  Endocrine: Negative for polyphagia.  Musculoskeletal: Positive for arthralgias.  Skin: Negative for color change and rash.  Neurological: Negative for dizziness, weakness and headaches.  Psychiatric/Behavioral: Positive for sleep disturbance. Negative for dysphoric mood. The patient is not nervous/anxious.     Patient Active Problem List   Diagnosis Date Noted  . Spasm of muscle of lower back 05/11/2017  . Fibroma of foot 03/03/2016  . Neuropathic pain of finger 03/03/2016  . Diabetes mellitus type 2, controlled, without complications ([place]) 62/26/3335  . OSA on CPAP 09/22/2015  . Intermittent palpitations 08/28/2015  . GERD (gastroesophageal reflux disease) 08/28/2015  . Insomnia 08/28/2015  . Internal hemorrhoids 08/28/2015  . Vitamin D deficiency 08/28/2015  . Obesity, Class III, BMI 40-49.9 (morbid obesity) ([place]) 08/27/2015  . Hypertension 02/14/2014  . Benign paroxysmal positional nystagmus 02/14/2014  . 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 Bipolar 1 disorder ([place]) 08/14/2012  . Adult hypothyroidism 06/21/2012  . Bilateral polycystic ovarian syndrome 06/21/2012    Prior to Admission medications   Medication Sig Start Date End Date Taking? Authorizing Provider  albuterol (PROVENTIL HFA;VENTOLIN HFA) 108 (90 Base) MCG/ACT inhaler Inhale 2 puffs into the lungs every 4 (four)  hours as needed for wheezing or  shortness of breath. 05/11/17  Yes [name], MD  Ascorbic Acid (VITAMIN C) 1000 MG tablet Take 1,000 mg by mouth daily.   Yes [provider]  Cholecalciferol 5000 units capsule Take 1 capsule (5,000 Units total) by mouth daily. 11/28/15  Yes Plonk, Gwyndolyn Saxon, MD  cyclobenzaprine (FEXMID) 7.5 MG tablet Take 7.5 mg by mouth 3 (three) times daily as needed for muscle spasms (PRN).    Yes [provider]  diclofenac (VOLTAREN) 75 MG EC tablet Take 75 mg by mouth 2 (two) times daily. 08/23/17  Yes [provider]  fluticasone (FLONASE) 50 MCG/ACT nasal spray PLACE 2 SPRAYS DAILY INTO BOTH NOSTRILS. 07/14/17  Yes [name], MD  hydrochlorothiazide (HYDRODIURIL) 25 MG tablet TAKE 1 TABLET BY MOUTH EVERY DAY 11/01/17  Yes [name], MD  ibuprofen (ADVIL,MOTRIN) 800 MG tablet Take 1 tablet (800 mg total) by mouth every 8 (eight) hours as needed. 06/10/16  Yes Melynda Ripple, MD  JANUVIA 100 MG tablet TAKE 1 TABLET (100 MG TOTAL) BY MOUTH DAILY. 06/16/17  Yes [name], MD  lamoTRIgine (LAMICTAL) 100 MG tablet Take 300 mg by mouth daily.  11/28/15  Yes [provider]  levothyroxine (SYNTHROID, LEVOTHROID) 175 MCG tablet TAKE 1 TABLET (175 MCG TOTAL) BY MOUTH DAILY. 09/05/17  Yes [name], MD  losartan (COZAAR) 50 MG tablet TAKE 1 TABLET (50 MG TOTAL) BY MOUTH 2 (TWO) TIMES DAILY. 06/14/17  Yes [name], MD  Melatonin 5 MG CAPS Take 15 mg by mouth Nightly.    Yes [provider]  metFORMIN (GLUCOPHAGE) 500 MG tablet Take 1 tablet (500 mg total) by mouth 3 (three) times daily. 11/06/17  Yes [name], MD  Multiple Vitamin (MULTIVITAMIN) tablet Take 1 tablet by mouth daily.   Yes [provider]  Omega-3 Fatty Acids (FISH OIL) 1200 MG CAPS Take by mouth.   Yes [provider]  ondansetron (ZOFRAN-ODT) 4 MG disintegrating tablet Take 1 tablet (4 mg total) every 8 (eight) hours as needed by mouth for  nausea or vomiting. 05/23/17  Yes [name], MD  ranitidine (ZANTAC) 150 MG tablet TAKE 1 TABLET BY MOUTH TWICE A DAY 10/27/17  Yes [name], MD    Allergies  Allergen Reactions  . Hctz [Hydrochlorothiazide] Cough  . Lisinopril Cough  . Latex Rash    Condoms only    Past Surgical History:  Procedure Laterality Date  . ABDOMINAL HYSTERECTOMY  2012   cervical dysplasia/ovaries remian  . COLONOSCOPY WITH PROPOFOL N/A 06/19/2015   Procedure: COLONOSCOPY WITH PROPOFOL;  Surgeon: [name], MD;  Location: [facility];  Service: Endoscopy;  Laterality: N/A;  Diabetic - oral meds   . DENTAL SURGERY    . GALLBLADDER SURGERY      Social History   Tobacco Use  . Smoking status: Former Smoker    Packs/day: 1.00    Years: 10.00    Pack years: 10.00    Types: Cigarettes    Last attempt to quit: 07/12/2002    Years since quitting: 15.4  . Smokeless tobacco: Never Used  . Tobacco comment: quit 2004  Substance Use Topics  . Alcohol use: Yes    Alcohol/week: 0.0 oz    Comment: rarely  . Drug use: No     Medication list has been reviewed and updated.  Current Meds  Medication Sig  . albuterol (PROVENTIL HFA;VENTOLIN HFA) 108 (90  Base) MCG/ACT inhaler Inhale 2 puffs into the lungs every 4 (four) hours as needed for wheezing or shortness of breath.  . Ascorbic Acid (VITAMIN C) 1000 MG tablet Take 1,000 mg by mouth daily.  . Cholecalciferol 5000 units capsule Take 1 capsule (5,000 Units total) by mouth daily.  . cyclobenzaprine (FEXMID) 7.5 MG tablet Take 7.5 mg by mouth 3 (three) times daily as needed for muscle spasms (PRN).   Marland Kitchen diclofenac (VOLTAREN) 75 MG EC tablet Take 75 mg by mouth 2 (two) times daily.  . fluticasone (FLONASE) 50 MCG/ACT nasal spray PLACE 2 SPRAYS DAILY INTO BOTH NOSTRILS.  Marland Kitchen ibuprofen (ADVIL,MOTRIN) 800 MG tablet Take 1 tablet (800 mg total) by mouth every 8 (eight) hours as needed.  Marland Kitchen JANUVIA 100 MG tablet TAKE 1 TABLET (100 MG TOTAL) BY  MOUTH DAILY.  Marland Kitchen lamoTRIgine (LAMICTAL) 100 MG tablet Take 300 mg by mouth daily.   Marland Kitchen levothyroxine (SYNTHROID, LEVOTHROID) 175 MCG tablet TAKE 1 TABLET (175 MCG TOTAL) BY MOUTH DAILY.  Marland Kitchen losartan (COZAAR) 50 MG tablet TAKE 1 TABLET (50 MG TOTAL) BY MOUTH 2 (TWO) TIMES DAILY.  . Melatonin 5 MG CAPS Take 15 mg by mouth Nightly.   . metFORMIN (GLUCOPHAGE) 500 MG tablet Take 1 tablet (500 mg total) by mouth 3 (three) times daily.  . Multiple Vitamin (MULTIVITAMIN) tablet Take 1 tablet by mouth daily.  . Omega-3 Fatty Acids (FISH OIL) 1200 MG CAPS Take by mouth.  . ondansetron (ZOFRAN-ODT) 4 MG disintegrating tablet Take 1 tablet (4 mg total) every 8 (eight) hours as needed by mouth for nausea or vomiting.  . ranitidine (ZANTAC) 150 MG tablet TAKE 1 TABLET BY MOUTH TWICE A DAY    PHQ 2/9 Scores 04/14/2017 12/19/2015 11/27/2015 08/27/2015  PHQ - 2 Score 0 0 - -  Exception Documentation - - Other- indicate reason in comment box Other- indicate reason in comment box  Not completed - - psychiatry appt next week Follows Psych    Physical Exam  Constitutional: She is oriented to person, place, and time. She appears well-developed. No distress.  HENT:  Head: Normocephalic and atraumatic.  Eyes: Pupils are equal, round, and reactive to light.  Neck: Normal range of motion. Neck supple.  Cardiovascular: Normal rate, regular rhythm and normal heart sounds.  Pulmonary/Chest: Effort normal and breath sounds normal. No respiratory distress.  Musculoskeletal: Normal range of motion. She exhibits tenderness (at plantar fascia insertion on heel).  Neurological: She is alert and oriented to person, place, and time. She has normal strength. No sensory deficit.  Skin: Skin is warm and dry. No rash noted.  Psychiatric: She has a normal mood and affect. Her behavior is normal. Thought content normal.  Nursing note and vitals reviewed.   BP 138/84   Pulse 85   Temp 98.4 F (36.9 C) (Oral)   Resp 16   Ht 5\' 6"   (1.676 m)   Wt 292 lb (132.5 kg)   SpO2 97%   BMI 47.13 kg/m   Assessment and Plan: 1. Controlled type 2 diabetes mellitus without complication, without long-term current use of insulin ([place]) Continue current therapy - may need to change regimen if not improving - Hemoglobin D1V - Basic metabolic panel  2. Essential hypertension controlled  3. Adult hypothyroidism Supplemented; overdue for lab testing - TSH  4. Encounter for screening mammogram for breast cancer Pt is overdue for screening - MM DIGITAL SCREENING BILATERAL; Future  5. Cough in adult patient Continue zantac Add singulair and  benzonate Stop flonase - montelukast (SINGULAIR) 10 MG tablet; Take 1 tablet (10 mg total) by mouth at bedtime.  Dispense: 30 tablet; Refill: 3 - benzonatate (TESSALON) 200 MG capsule; Take 1 capsule (200 mg total) by mouth at bedtime.  Dispense: 30 capsule; Refill: 0  6. Plantar fasciitis Stretching and ice massage recommended   Meds ordered this encounter  Medications  . montelukast (SINGULAIR) 10 MG tablet    Sig: Take 1 tablet (10 mg total) by mouth at bedtime.    Dispense:  30 tablet    Refill:  3  . benzonatate (TESSALON) 200 MG capsule    Sig: Take 1 capsule (200 mg total) by mouth at bedtime.    Dispense:  30 capsule    Refill:  0    Partially dictated using Editor, commissioning. Any errors are unintentional.  Halina Maidens, MD [facility]  12/26/2017

## 2017-12-27 LAB — BASIC METABOLIC PANEL
BUN / CREAT RATIO: 12 (ref 9–23)
BUN: 10 mg/dL (ref 6–24)
CHLORIDE: 102 mmol/L (ref 96–106)
CO2: 23 mmol/L (ref 20–29)
Calcium: 9.1 mg/dL (ref 8.7–10.2)
Creatinine, Ser: 0.82 mg/dL (ref 0.57–1.00)
GFR calc Af Amer: 98 mL/min/{1.73_m2} (ref >59)
GFR calc non Af Amer: 85 mL/min/{1.73_m2} (ref >59)
GLUCOSE: 130 mg/dL — AB (ref 65–99)
Potassium: 4.5 mmol/L (ref 3.5–5.2)
SODIUM: 141 mmol/L (ref 134–144)

## 2017-12-27 LAB — HEMOGLOBIN A1C
ESTIMATED AVERAGE GLUCOSE: 157 mg/dL
Hgb A1c MFr Bld: 7.1 % — ABNORMAL HIGH (ref 4.8–5.6)

## 2017-12-27 LAB — TSH: TSH: 4.68 u[IU]/mL — ABNORMAL HIGH (ref 0.450–4.500)

## 2018-01-08 ENCOUNTER — Encounter: Payer: Self-pay | Admitting: Internal Medicine

## 2018-01-11 ENCOUNTER — Other Ambulatory Visit: Payer: Self-pay | Admitting: Internal Medicine

## 2018-01-11 DIAGNOSIS — R05 Cough: Secondary | ICD-10-CM

## 2018-01-11 DIAGNOSIS — R059 Cough, unspecified: Secondary | ICD-10-CM

## 2018-01-19 DIAGNOSIS — R05 Cough: Secondary | ICD-10-CM | POA: Diagnosis not present

## 2018-01-19 DIAGNOSIS — K219 Gastro-esophageal reflux disease without esophagitis: Secondary | ICD-10-CM | POA: Diagnosis not present

## 2018-01-19 DIAGNOSIS — J329 Chronic sinusitis, unspecified: Secondary | ICD-10-CM | POA: Diagnosis not present

## 2018-01-19 DIAGNOSIS — J309 Allergic rhinitis, unspecified: Secondary | ICD-10-CM | POA: Diagnosis not present

## 2018-01-19 DIAGNOSIS — R0981 Nasal congestion: Secondary | ICD-10-CM | POA: Diagnosis not present

## 2018-01-20 ENCOUNTER — Other Ambulatory Visit: Payer: Self-pay | Admitting: Internal Medicine

## 2018-01-27 ENCOUNTER — Ambulatory Visit: Payer: BLUE CROSS/BLUE SHIELD | Admitting: Internal Medicine

## 2018-01-27 ENCOUNTER — Encounter: Payer: Self-pay | Admitting: Internal Medicine

## 2018-01-27 VITALS — BP 118/70 | HR 88 | Ht 66.0 in | Wt 296.0 lb

## 2018-01-27 DIAGNOSIS — G4733 Obstructive sleep apnea (adult) (pediatric): Secondary | ICD-10-CM | POA: Diagnosis not present

## 2018-01-27 DIAGNOSIS — R059 Cough, unspecified: Secondary | ICD-10-CM

## 2018-01-27 DIAGNOSIS — R05 Cough: Secondary | ICD-10-CM | POA: Diagnosis not present

## 2018-01-27 DIAGNOSIS — N3000 Acute cystitis without hematuria: Secondary | ICD-10-CM | POA: Diagnosis not present

## 2018-01-27 DIAGNOSIS — Z9989 Dependence on other enabling machines and devices: Secondary | ICD-10-CM

## 2018-01-27 DIAGNOSIS — E119 Type 2 diabetes mellitus without complications: Secondary | ICD-10-CM | POA: Diagnosis not present

## 2018-01-27 DIAGNOSIS — F319 Bipolar disorder, unspecified: Secondary | ICD-10-CM

## 2018-01-27 LAB — POCT URINALYSIS DIPSTICK
Bilirubin, UA: NEGATIVE
GLUCOSE UA: NEGATIVE
Ketones, UA: NEGATIVE
NITRITE UA: NEGATIVE
Protein, UA: NEGATIVE
SPEC GRAV UA: 1.02 (ref 1.010–1.025)
UROBILINOGEN UA: 0.2 U/dL
pH, UA: 5 (ref 5.0–8.0)

## 2018-01-27 MED ORDER — CIPROFLOXACIN HCL 250 MG PO TABS: 250.0000 mg | ORAL_TABLET | Freq: Two times a day (BID) | ORAL | 0 refills | Status: AC

## 2018-01-27 NOTE — Progress Notes (Signed)
Date:  01/27/2018   Name:  Lori Knight   DOB:  [birth year]   MRN:  [id number]   Chief Complaint: Headache (On and off- starts in the back of head. All of a sudden. ); Fatigue (On going for a year. Can be talking or eating and then falling alseep. ); and Vaginal Itching (Started a week ago. On and off has this problem. Itching is only in the inside of vaginal and not outside. ) Cough  This is a chronic problem. The current episode started more than 1 month ago. The problem occurs every few minutes. The cough is non-productive. Associated symptoms include postnasal drip. Pertinent negatives include no chest pain, chills, fever, headaches, rash, sore throat or wheezing. She has tried body position changes (last visit added singulair (worse) and tessalon (no relief)) for the symptoms. The treatment provided no relief (referred to ENT - work up pending).   Bipolar - followed and treated by Psychiatry.  She is stable on Lamictal daily. No concerns today.  DM - on dual oral agents.  Her BS vary wildly but last A1C was good.  She is not taking insulin or any other injectable.  Fatigue - chronic issue but seems to be getting worse.  Falls asleep easily but never feels rested. She is not using CPAP but she will try to start.  She is urged to start again, use humidifier as well.  Vaginitis - started last week, unchanged.  Itching in the vagina but no discharge noted.  She actually thinks that she might have a UTI.  She has stopped the tessalon and singulair due to no benefit. She continues on zantac.  She tried to undergo nasal scope but had excessive gagging and choking.  Since then has some pressure on the back of her head.  No n/v or vision changes.  Pain relieved by naproxen.  Review of Systems  Constitutional: Negative for chills, diaphoresis, fatigue, fever and unexpected weight change.  HENT: Positive for postnasal drip. Negative for sinus pressure and sore throat.   Eyes: Negative for  visual disturbance.  Respiratory: Positive for cough. Negative for chest tightness and wheezing.   Cardiovascular: Negative for chest pain, palpitations and leg swelling.  Endocrine: Negative for polyphagia.  Genitourinary: Positive for frequency and urgency. Negative for dysuria and hematuria.  Musculoskeletal: Positive for arthralgias.  Skin: Negative for color change and rash.  Neurological: Negative for dizziness, weakness and headaches.  Psychiatric/Behavioral: Positive for sleep disturbance. Negative for dysphoric mood. The patient is not nervous/anxious.     Patient Active Problem List   Diagnosis Date Noted  . Plantar fasciitis 12/26/2017  . Cough in adult patient 12/26/2017  . Spasm of muscle of lower back 05/11/2017  . Fibroma of foot 03/03/2016  . Neuropathic pain of finger 03/03/2016  . Diabetes mellitus type 2, controlled, without complications ([place]) 66/12/3014  . OSA on CPAP 09/22/2015  . Intermittent palpitations 08/28/2015  . GERD (gastroesophageal reflux disease) 08/28/2015  . Insomnia 08/28/2015  . Internal hemorrhoids 08/28/2015  . Vitamin D deficiency 08/28/2015  . Obesity, Class III, BMI 40-49.9 (morbid obesity) ([place]) 08/27/2015  . Hypertension 02/14/2014  . Benign paroxysmal positional nystagmus 02/14/2014  . 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 Bipolar 1 disorder ([place]) 08/14/2012  . Adult hypothyroidism 06/21/2012  . Bilateral polycystic ovarian syndrome 06/21/2012    Prior to Admission medications   Medication Sig Start Date End Date Taking? Authorizing Provider  albuterol (PROVENTIL HFA;VENTOLIN HFA) 108 (90 Base) MCG/ACT  inhaler Inhale 2 puffs into the lungs every 4 (four) hours as needed for wheezing or shortness of breath. 05/11/17   [name], MD  Ascorbic Acid (VITAMIN C) 1000 MG tablet Take 1,000 mg by mouth daily.    [provider]  benzonatate (TESSALON) 200 MG capsule Take 1 capsule (200 mg total) by mouth at bedtime.  12/26/17   [name], MD  Cholecalciferol 5000 units capsule Take 1 capsule (5,000 Units total) by mouth daily. 11/28/15   Plonk, Gwyndolyn Saxon, MD  cyclobenzaprine (FEXMID) 7.5 MG tablet Take 7.5 mg by mouth 3 (three) times daily as needed for muscle spasms (PRN).     [provider]  diclofenac (VOLTAREN) 75 MG EC tablet Take 75 mg by mouth 2 (two) times daily. 08/23/17   [provider]  fluticasone (FLONASE) 50 MCG/ACT nasal spray PLACE 2 SPRAYS DAILY INTO BOTH NOSTRILS. 07/14/17   [name], MD  hydrochlorothiazide (HYDRODIURIL) 25 MG tablet TAKE 1 TABLET BY MOUTH EVERY DAY Patient not taking: Reported on 12/26/2017 11/01/17   [name], MD  ibuprofen (ADVIL,MOTRIN) 800 MG tablet Take 1 tablet (800 mg total) by mouth every 8 (eight) hours as needed. 06/10/16   Melynda Ripple, MD  JANUVIA 100 MG tablet TAKE 1 TABLET (100 MG TOTAL) BY MOUTH DAILY. 06/16/17   [name], MD  lamoTRIgine (LAMICTAL) 100 MG tablet Take 300 mg by mouth daily.  11/28/15   [provider]  levothyroxine (SYNTHROID, LEVOTHROID) 175 MCG tablet TAKE 1 TABLET (175 MCG TOTAL) BY MOUTH DAILY. 09/05/17   [name], MD  losartan (COZAAR) 50 MG tablet TAKE 1 TABLET (50 MG TOTAL) BY MOUTH 2 (TWO) TIMES DAILY. 06/14/17   [name], MD  Melatonin 5 MG CAPS Take 15 mg by mouth Nightly.     [provider]  metFORMIN (GLUCOPHAGE) 500 MG tablet Take 1 tablet (500 mg total) by mouth 3 (three) times daily. 11/06/17   [name], MD  metFORMIN (GLUCOPHAGE) 500 MG tablet TAKE 1 TABLET BY MOUTH THREE TIMES A DAY *NEED TO SCHEDULE FOLLOW UP IN NEXT 30 DAYS* 01/20/18   [name], MD  montelukast (SINGULAIR) 10 MG tablet Take 1 tablet (10 mg total) by mouth at bedtime. 12/26/17   [name], MD  Multiple Vitamin (MULTIVITAMIN) tablet Take 1 tablet by mouth daily.    [provider]  Omega-3 Fatty Acids (FISH OIL) 1200 MG CAPS Take by mouth.     [provider]  ondansetron (ZOFRAN-ODT) 4 MG disintegrating tablet Take 1 tablet (4 mg total) every 8 (eight) hours as needed by mouth for nausea or vomiting. 05/23/17   [name], MD  ranitidine (ZANTAC) 150 MG tablet TAKE 1 TABLET BY MOUTH TWICE A DAY 10/27/17   [name], MD    Allergies  Allergen Reactions  . Hctz [Hydrochlorothiazide] Cough  . Lisinopril Cough  . Latex Rash    Condoms only    Past Surgical History:  Procedure Laterality Date  . ABDOMINAL HYSTERECTOMY  2012   cervical dysplasia/ovaries remian  . COLONOSCOPY WITH PROPOFOL N/A 06/19/2015   Procedure: COLONOSCOPY WITH PROPOFOL;  Surgeon: [name], MD;  Location: [facility];  Service: Endoscopy;  Laterality: N/A;  Diabetic - oral meds   . DENTAL SURGERY    . GALLBLADDER SURGERY      Social History   Tobacco Use  . Smoking status: Former Smoker    Packs/day: 1.00  Years: 10.00    Pack years: 10.00    Types: Cigarettes    Last attempt to quit: 07/12/2002    Years since quitting: 15.5  . Smokeless tobacco: Never Used  . Tobacco comment: quit 2004  Substance Use Topics  . Alcohol use: Yes    Alcohol/week: 0.0 oz    Comment: rarely  . Drug use: No     Medication list has been reviewed and updated.  Current Meds  Medication Sig  . albuterol (PROVENTIL HFA;VENTOLIN HFA) 108 (90 Base) MCG/ACT inhaler Inhale 2 puffs into the lungs every 4 (four) hours as needed for wheezing or shortness of breath.  . Ascorbic Acid (VITAMIN C) 1000 MG tablet Take 1,000 mg by mouth daily.  . Cholecalciferol 5000 units capsule Take 1 capsule (5,000 Units total) by mouth daily.  . cyclobenzaprine (FEXMID) 7.5 MG tablet Take 7.5 mg by mouth 3 (three) times daily as needed for muscle spasms (PRN).   Marland Kitchen diclofenac (VOLTAREN) 75 MG EC tablet Take 75 mg by mouth 2 (two) times daily.  . fluticasone (FLONASE) 50 MCG/ACT nasal spray PLACE 2 SPRAYS DAILY INTO BOTH NOSTRILS.  . hydrochlorothiazide  (HYDRODIURIL) 25 MG tablet TAKE 1 TABLET BY MOUTH EVERY DAY  . ibuprofen (ADVIL,MOTRIN) 800 MG tablet Take 1 tablet (800 mg total) by mouth every 8 (eight) hours as needed.  Marland Kitchen JANUVIA 100 MG tablet TAKE 1 TABLET (100 MG TOTAL) BY MOUTH DAILY.  Marland Kitchen lamoTRIgine (LAMICTAL) 100 MG tablet Take 300 mg by mouth daily.   Marland Kitchen levothyroxine (SYNTHROID, LEVOTHROID) 175 MCG tablet TAKE 1 TABLET (175 MCG TOTAL) BY MOUTH DAILY.  Marland Kitchen losartan (COZAAR) 50 MG tablet TAKE 1 TABLET (50 MG TOTAL) BY MOUTH 2 (TWO) TIMES DAILY.  . Melatonin 5 MG CAPS Take 15 mg by mouth Nightly.   . metFORMIN (GLUCOPHAGE) 500 MG tablet Take 1 tablet (500 mg total) by mouth 3 (three) times daily.  . metFORMIN (GLUCOPHAGE) 500 MG tablet TAKE 1 TABLET BY MOUTH THREE TIMES A DAY *NEED TO SCHEDULE FOLLOW UP IN NEXT 30 DAYS*  . montelukast (SINGULAIR) 10 MG tablet Take 1 tablet (10 mg total) by mouth at bedtime.  . Multiple Vitamin (MULTIVITAMIN) tablet Take 1 tablet by mouth daily.  . Omega-3 Fatty Acids (FISH OIL) 1200 MG CAPS Take by mouth.  . ondansetron (ZOFRAN-ODT) 4 MG disintegrating tablet Take 1 tablet (4 mg total) every 8 (eight) hours as needed by mouth for nausea or vomiting.  . ranitidine (ZANTAC) 150 MG tablet TAKE 1 TABLET BY MOUTH TWICE A DAY    PHQ 2/9 Scores 01/27/2018 04/14/2017 12/19/2015 11/27/2015  PHQ - 2 Score 0 0 0 -  Exception Documentation Other- indicate reason in comment box - - Other- indicate reason in comment box  Not completed Pt is treated and followed by Psychiatry - - psychiatry appt next week    Physical Exam  Constitutional: She is oriented to person, place, and time. She appears well-developed. No distress.  HENT:  Head: Normocephalic and atraumatic.  Eyes: Pupils are equal, round, and reactive to light.  Neck: Normal range of motion. Neck supple.  Cardiovascular: Normal rate and regular rhythm.  Pulmonary/Chest: Effort normal. No respiratory distress.  Abdominal: Soft. Bowel sounds are normal.    Genitourinary:  Genitourinary Comments: Exam deferred  Musculoskeletal: Normal range of motion. She exhibits no tenderness (palpation of posterior and parietal scalp normal).       Cervical back: She exhibits normal range of motion and no spasm.  Neurological:  She is alert and oriented to person, place, and time.  Skin: Skin is warm and dry. No rash noted.  Psychiatric: She has a normal mood and affect. Her speech is normal and behavior is normal. Thought content normal. Cognition and memory are normal.  Nursing note and vitals reviewed.   BP 118/70   Pulse 88   Ht 5\' 6"  (1.676 m)   Wt 296 lb (134.3 kg)   SpO2 97%   BMI 47.78 kg/m   Assessment and Plan: 1. Cough in adult patient Slightly improved with zantac and elevation of HOB See ENT as planned  2. Controlled type 2 diabetes mellitus without complication, without long-term current use of insulin ([place]) Doing well on oral agents  3. Bipolar 1 disorder ([facility]) stable  4. OSA on CPAP Pt to resume CPAP - urged to try even 4 hours per day  5. Acute cystitis without hematuria Treat with 5 days Cipro - POCT urinalysis dipstick   Meds ordered this encounter  Medications  . ciprofloxacin (CIPRO) 250 MG tablet    Sig: Take 1 tablet (250 mg total) by mouth 2 (two) times daily for 5 days.    Dispense:  10 tablet    Refill:  0    Partially dictated using Editor, commissioning. Any errors are unintentional.  Halina Maidens, MD [facility]  01/27/2018   There are no diagnoses linked to this encounter.

## 2018-01-31 DIAGNOSIS — G4733 Obstructive sleep apnea (adult) (pediatric): Secondary | ICD-10-CM | POA: Diagnosis not present

## 2018-01-31 DIAGNOSIS — K219 Gastro-esophageal reflux disease without esophagitis: Secondary | ICD-10-CM | POA: Diagnosis not present

## 2018-01-31 DIAGNOSIS — R05 Cough: Secondary | ICD-10-CM | POA: Diagnosis not present

## 2018-01-31 DIAGNOSIS — J3489 Other specified disorders of nose and nasal sinuses: Secondary | ICD-10-CM | POA: Diagnosis not present

## 2018-02-09 ENCOUNTER — Encounter: Payer: Self-pay | Admitting: Internal Medicine

## 2018-02-09 DIAGNOSIS — H52223 Regular astigmatism, bilateral: Secondary | ICD-10-CM | POA: Diagnosis not present

## 2018-02-09 DIAGNOSIS — H524 Presbyopia: Secondary | ICD-10-CM | POA: Diagnosis not present

## 2018-02-09 DIAGNOSIS — H5213 Myopia, bilateral: Secondary | ICD-10-CM | POA: Diagnosis not present

## 2018-02-09 DIAGNOSIS — Z7984 Long term (current) use of oral hypoglycemic drugs: Secondary | ICD-10-CM | POA: Diagnosis not present

## 2018-02-09 LAB — HM DIABETES EYE EXAM

## 2018-02-10 ENCOUNTER — Encounter: Payer: Self-pay | Admitting: Internal Medicine

## 2018-02-14 ENCOUNTER — Ambulatory Visit: Payer: Self-pay | Admitting: Family Medicine

## 2018-02-16 ENCOUNTER — Ambulatory Visit
Admission: EM | Admit: 2018-02-16 | Discharge: 2018-02-16 | Disposition: A | Discharge status: Home / Self Care | Payer: BLUE CROSS/BLUE SHIELD | Attending: Family Medicine | Admitting: Family Medicine

## 2018-02-16 ENCOUNTER — Telehealth: Payer: Self-pay

## 2018-02-16 ENCOUNTER — Other Ambulatory Visit: Payer: Self-pay

## 2018-02-16 DIAGNOSIS — R350 Frequency of micturition: Secondary | ICD-10-CM

## 2018-02-16 DIAGNOSIS — R110 Nausea: Secondary | ICD-10-CM

## 2018-02-16 DIAGNOSIS — R300 Dysuria: Secondary | ICD-10-CM

## 2018-02-16 LAB — URINALYSIS, COMPLETE (UACMP) WITH MICROSCOPIC
BILIRUBIN URINE: NEGATIVE
Glucose, UA: NEGATIVE mg/dL
NITRITE: NEGATIVE
PH: 5.5 (ref 5.0–8.0)
Protein, ur: 100 mg/dL — AB
SPECIFIC GRAVITY, URINE: 1.025 (ref 1.005–1.030)
WBC, UA: >50 WBC/hpf (ref 0–5)

## 2018-02-16 MED ORDER — NITROFURANTOIN MONOHYD MACRO 100 MG PO CAPS: 100.0000 mg | ORAL_CAPSULE | Freq: Two times a day (BID) | ORAL | 0 refills | Status: AC

## 2018-02-16 MED ORDER — FLUCONAZOLE 150 MG PO TABS: 150.0000 mg | ORAL_TABLET | Freq: Once | ORAL | 0 refills | Status: AC

## 2018-02-16 NOTE — ED Provider Notes (Signed)
[facility]    CSN: [id number] Arrival date & time: 02/16/18  1918     History   Chief Complaint Chief Complaint  Patient presents with  . Urinary Frequency    HPI Lori Knight is a 47 y.o. female history of DM type II, hypertension, PCOS, OSA presenting today for evaluation of urinary frequency and dysuria.  Patient states that she has had the symptoms for approximately 2 to 3 weeks.  She notes that she was treated around 7/19 for UTI with Cipro for 3 days.  She feels like her symptoms slightly improved, but never fully went away and since have worsened.  She has had some mild nausea, occasional back pain especially on the right side.  Denies any fevers.  Patient is postmenopausal secondary to hysterectomy.  Patient notes she has had a UTI monthly for the past 4 months.  HPI  Past Medical History:  Diagnosis Date  . Adult hypothyroidism 06/21/2012  . Bilateral polycystic ovarian syndrome 06/21/2012  . Biliary calculus with cholecystitis 08/14/2012  . Bipolar 1 disorder ([place])   . BP (high blood pressure) 02/14/2014  . Diabetes mellitus ([place]) 08/14/2012  . Diabetes mellitus without complication ([place])    pre-diabetic  . Dysrhythmia    wore heart monitor 2016. Corrected by changing Levothyroxine dose.  [place] Heart murmur    followed by PCP  . Hyperprolactinemia ([place]) 06/21/2012  . Hypertension   . Motion sickness    carnival rides  . PONV (postoperative nausea and vomiting)   . Shortness of breath dyspnea    stairs. related to wt.  . Sleep apnea    has CPAP. has not used since 2011  . Vertigo    2x in last yr    Patient Active Problem List   Diagnosis Date Noted  . Plantar fasciitis 12/26/2017  . Cough in adult patient 12/26/2017  . Spasm of muscle of lower back 05/11/2017  . Fibroma of foot 03/03/2016  . Neuropathic pain of finger 03/03/2016  . Diabetes mellitus type 2, controlled, without complications ([place]) 01/60/1093  . OSA on CPAP 09/22/2015  .  Intermittent palpitations 08/28/2015  . GERD (gastroesophageal reflux disease) 08/28/2015  . Insomnia 08/28/2015  . Internal hemorrhoids 08/28/2015  . Vitamin D deficiency 08/28/2015  . Obesity, Class III, BMI 40-49.9 (morbid obesity) ([place]) 08/27/2015  . Hypertension 02/14/2014  . Benign paroxysmal positional nystagmus 02/14/2014  . 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 Bipolar 1 disorder ([place]) 08/14/2012  . Adult hypothyroidism 06/21/2012  . Bilateral polycystic ovarian syndrome 06/21/2012    Past Surgical History:  Procedure Laterality Date  . ABDOMINAL HYSTERECTOMY  2012   cervical dysplasia/ovaries remian  . COLONOSCOPY WITH PROPOFOL N/A 06/19/2015   Procedure: COLONOSCOPY WITH PROPOFOL;  Surgeon: [name], MD;  Location: [place];  Service: Endoscopy;  Laterality: N/A;  Diabetic - oral meds   . DENTAL SURGERY    . GALLBLADDER SURGERY      OB History   None      Home Medications    Prior to Admission medications   Medication Sig Start Date End Date Taking? Authorizing Provider  albuterol (PROVENTIL HFA;VENTOLIN HFA) 108 (90 Base) MCG/ACT inhaler Inhale 2 puffs into the lungs every 4 (four) hours as needed for wheezing or shortness of breath. 05/11/17  Yes [name], MD  Ascorbic Acid (VITAMIN C) 1000 MG tablet Take 1,000 mg by mouth daily.   Yes [provider]  Cholecalciferol 5000 units capsule Take 1  capsule (5,000 Units total) by mouth daily. 11/28/15  Yes Plonk, Gwyndolyn Saxon, MD  cyclobenzaprine (FEXMID) 7.5 MG tablet Take 7.5 mg by mouth 3 (three) times daily as needed for muscle spasms (PRN).    Yes [provider]  diclofenac (VOLTAREN) 75 MG EC tablet Take 75 mg by mouth 2 (two) times daily. 08/23/17  Yes [provider]  fluticasone (FLONASE) 50 MCG/ACT nasal spray PLACE 2 SPRAYS DAILY INTO BOTH NOSTRILS. 07/14/17  Yes [name], MD  hydrochlorothiazide (HYDRODIURIL) 25 MG tablet TAKE 1 TABLET BY MOUTH EVERY DAY  11/01/17  Yes [name], MD  ibuprofen (ADVIL,MOTRIN) 800 MG tablet Take 1 tablet (800 mg total) by mouth every 8 (eight) hours as needed. 06/10/16  Yes Melynda Ripple, MD  JANUVIA 100 MG tablet TAKE 1 TABLET (100 MG TOTAL) BY MOUTH DAILY. 06/16/17  Yes [name], MD  lamoTRIgine (LAMICTAL) 100 MG tablet Take 300 mg by mouth daily.  11/28/15  Yes [provider]  levothyroxine (SYNTHROID, LEVOTHROID) 175 MCG tablet TAKE 1 TABLET (175 MCG TOTAL) BY MOUTH DAILY. 09/05/17  Yes [name], MD  losartan (COZAAR) 50 MG tablet TAKE 1 TABLET (50 MG TOTAL) BY MOUTH 2 (TWO) TIMES DAILY. 06/14/17  Yes [name], MD  Melatonin 5 MG CAPS Take 15 mg by mouth Nightly.    Yes [provider]  metFORMIN (GLUCOPHAGE) 500 MG tablet Take 1 tablet (500 mg total) by mouth 3 (three) times daily. 11/06/17  Yes [name], MD  metFORMIN (GLUCOPHAGE) 500 MG tablet TAKE 1 TABLET BY MOUTH THREE TIMES A DAY *NEED TO SCHEDULE FOLLOW UP IN NEXT 30 DAYS* 01/20/18  Yes [name], MD  Multiple Vitamin (MULTIVITAMIN) tablet Take 1 tablet by mouth daily.   Yes [provider]  Omega-3 Fatty Acids (FISH OIL) 1200 MG CAPS Take by mouth.   Yes [provider]  ondansetron (ZOFRAN-ODT) 4 MG disintegrating tablet Take 1 tablet (4 mg total) every 8 (eight) hours as needed by mouth for nausea or vomiting. 05/23/17  Yes [name], MD  ranitidine (ZANTAC) 150 MG tablet TAKE 1 TABLET BY MOUTH TWICE A DAY 10/27/17  Yes [name], MD  fluconazole (DIFLUCAN) 150 MG tablet Take 1 tablet (150 mg total) by mouth once for 1 dose. 02/16/18 02/16/18  Shade Rivenbark C, PA-C  nitrofurantoin, macrocrystal-monohydrate, (MACROBID) 100 MG capsule Take 1 capsule (100 mg total) by mouth 2 (two) times daily for 5 days. 02/16/18 02/21/18  Teleshia Lemere, Elesa Hacker, PA-C    Family History Family History  Problem Relation Age of Onset  . Diabetes Maternal Grandmother   . Hypertension  Maternal Grandmother   . Hyperlipidemia Maternal Grandmother   . Hypothyroidism Maternal Grandmother   . Diabetes Mother   . Hypertension Mother   . Hypothyroidism Mother   . Bipolar disorder Mother   . Breast cancer Neg Hx     Social History Social History   Tobacco Use  . Smoking status: Former Smoker    Packs/day: 1.00    Years: 10.00    Pack years: 10.00    Types: Cigarettes    Last attempt to quit: 07/12/2002    Years since quitting: 15.6  . Smokeless tobacco: Never Used  . Tobacco comment: quit 2004  Substance Use Topics  . Alcohol use: Yes    Alcohol/week: 0.0 standard drinks    Comment: rarely  . Drug use: No     Allergies   Hctz [  hydrochlorothiazide]; Lisinopril; and Latex   Review of Systems Review of Systems  Constitutional: Negative for fever.  Respiratory: Negative for shortness of breath.   Cardiovascular: Negative for chest pain.  Gastrointestinal: Negative for abdominal pain, diarrhea, nausea and vomiting.  Genitourinary: Positive for difficulty urinating, dysuria, frequency and urgency. Negative for flank pain, genital sores, hematuria, menstrual problem, vaginal bleeding, vaginal discharge and vaginal pain.  Musculoskeletal: Negative for back pain.  Skin: Negative for rash.  Neurological: Negative for dizziness, light-headedness and headaches.     Physical Exam Triage Vital Signs ED Triage Vitals  Enc Vitals Group     BP 02/16/18 1937 (!) 168/114     Pulse Rate 02/16/18 1937 84     Resp 02/16/18 1937 18     Temp 02/16/18 1937 99.1 F (37.3 C)     Temp Source 02/16/18 1937 Oral     SpO2 02/16/18 1937 99 %     Weight 02/16/18 1936 296 lb (134.3 kg)     Height 02/16/18 1936 5\' 6"  (1.676 m)     Head Circumference --      Peak Flow --      Pain Score 02/16/18 1935 4     Pain Loc --      Pain Edu? --      Excl. in [place]? --    No data found.  Updated Vital Signs BP (!) 168/114 (BP Location: Left Arm)   Pulse 84   Temp 99.1 F (37.3 C)  (Oral)   Resp 18   Ht 5\' 6"  (1.676 m)   Wt 296 lb (134.3 kg)   SpO2 99%   BMI 47.78 kg/m   Visual Acuity Right Eye Distance:   Left Eye Distance:   Bilateral Distance:    Right Eye Near:   Left Eye Near:    Bilateral Near:     Physical Exam  Constitutional: She is oriented to person, place, and time. She appears well-developed and well-nourished.  No acute distress  HENT:  Head: Normocephalic and atraumatic.  Nose: Nose normal.  Eyes: Conjunctivae are normal.  Neck: Neck supple.  Cardiovascular: Normal rate.  Pulmonary/Chest: Effort normal. No respiratory distress.  Abdominal: She exhibits no distension.  Nontender to light deep palpation throughout all 4 quadrants, does have tenderness over suprapubic area. Negative CVA tenderness  Musculoskeletal: Normal range of motion.  Neurological: She is alert and oriented to person, place, and time.  Skin: Skin is warm and dry.  Psychiatric: She has a normal mood and affect.  Nursing note and vitals reviewed.    UC Treatments / Results  Labs (all labs ordered are listed, but only abnormal results are displayed) Labs Reviewed  URINALYSIS, COMPLETE (UACMP) WITH MICROSCOPIC - Abnormal; Notable for the following components:      Result Value   APPearance CLOUDY (*)    Hgb urine dipstick MODERATE (*)    Ketones, ur TRACE (*)    Protein, ur 100 (*)    Leukocytes, UA LARGE (*)    Bacteria, UA FEW (*)    All other components within normal limits  URINE CULTURE    EKG None  Radiology No results found.  Procedures Procedures (including critical care time)  Medications Ordered in UC Medications - No data to display  Initial Impression / Assessment and Plan / UC Course  I have reviewed the triage vital signs and the nursing notes.  Pertinent labs & imaging results that were available during my care of the patient were reviewed  by me and considered in my medical decision making (see chart for details).     Large  leuks on UA, will treat for UTI with Macrobid.  Urine culture obtained given patient recently treated with persistent symptoms.  Will call patient with results and alter treatment as needed.  Discussed with patient if she continues to have recurrent UTIs to follow-up with her PCP or possibly urology.Discussed strict return precautions. Patient verbalized understanding and is agreeable with plan.  Final Clinical Impressions(s) / UC Diagnoses   Final diagnoses:  Dysuria     Discharge Instructions     Urine showed evidence of infection. We are treating you with macrobid. Be sure to take full course. Stay hydrated- urine should be pale yellow to clear. May continue azo for relief of burning while infection is being cleared.   Please return or follow up with your primary provider if symptoms not improving with treatment. Please return sooner if you have worsening of symptoms or develop fever, nausea, vomiting, abdominal pain, back pain, lightheadedness, dizziness.   ED Prescriptions    Medication Sig Dispense Auth. Provider   nitrofurantoin, macrocrystal-monohydrate, (MACROBID) 100 MG capsule Take 1 capsule (100 mg total) by mouth 2 (two) times daily for 5 days. 10 capsule Cordie Beazley C, PA-C   fluconazole (DIFLUCAN) 150 MG tablet Take 1 tablet (150 mg total) by mouth once for 1 dose. 2 tablet Jules Baty C, PA-C     Controlled Substance Prescriptions [place] Controlled Substance Registry consulted? Not Applicable   Janith Lima, [place] 02/16/18 [phone]

## 2018-02-16 NOTE — Telephone Encounter (Signed)
Patient called and left a VM stating she was not feeling well. Has appt for UTI tomorrow at 8am and wanting to know if Dr Ronnald Ramp could see her today instead.  Called and informed patient Ronnald Ramp schedule is full today but if she has more than just UTI problems then she needs to see UC and not wait. Informed her Ronnald Ramp is only seeing acute issues this week while [facility] is out. Told her if she is having multiple probs to go see urgent care and not wait until next week. She declined urgent care and said when she said "i'm not feeling well" that means her back was hurting and the UTI is getting worse.   Informed her she can still keep 8am appt tomorrow but it will only be to treat and evaluate UTI. She verbalized understanding.

## 2018-02-16 NOTE — Discharge Instructions (Signed)
Urine showed evidence of infection. We are treating you with macrobid. Be sure to take full course. Stay hydrated- urine should be pale yellow to clear. May continue azo for relief of burning while infection is being cleared.   Please return or follow up with your primary provider if symptoms not improving with treatment. Please return sooner if you have worsening of symptoms or develop fever, nausea, vomiting, abdominal pain, back pain, lightheadedness, dizziness.

## 2018-02-16 NOTE — ED Triage Notes (Signed)
Patient complains of urinary frequency, burning with urination, low back pain x off and on for a few months. Patient states that she has had 2 e-visits with insurance and then a visit with Dr. Army Melia. Patient states that she has not had a urine culture. Patient states that she most recently took Cipro. She states that she felt better with cipro x 3 days and then took one diflucan.

## 2018-02-17 ENCOUNTER — Ambulatory Visit: Payer: Self-pay | Admitting: Family Medicine

## 2018-02-18 LAB — URINE CULTURE

## 2018-02-21 ENCOUNTER — Other Ambulatory Visit: Payer: Self-pay | Admitting: Internal Medicine

## 2018-02-21 ENCOUNTER — Telehealth: Payer: Self-pay | Admitting: Emergency Medicine

## 2018-02-21 ENCOUNTER — Telehealth: Payer: Self-pay

## 2018-02-21 DIAGNOSIS — J329 Chronic sinusitis, unspecified: Secondary | ICD-10-CM | POA: Diagnosis not present

## 2018-02-21 DIAGNOSIS — Z87440 Personal history of urinary (tract) infections: Secondary | ICD-10-CM

## 2018-02-21 NOTE — Telephone Encounter (Signed)
Patient called and left VM. She was seen in UC on Thursday for UTI symptoms. Still having pain and irritaion since taking antibiotics. Only has one antibiotic left. She stated that UC said "something was wrong with her Urine culture." She wanted to know if she needs to make an appt with [place]. Should she see uroloygy? Patient canceled her first appt this past week with Dr Ronnald Ramp and no showed for the last one for having this UTI.  Please Advise.

## 2018-02-21 NOTE — Telephone Encounter (Signed)
Her culture was not definite for a single bacteria - it showed mixed species which is usually not a UTI.  If she is still having sx, she needs to be seen or see Urology if they can get in her quickly.

## 2018-02-21 NOTE — Telephone Encounter (Signed)
Patient would like referral for urology here in [place].  Please Advise.

## 2018-02-21 NOTE — Telephone Encounter (Signed)
Patient called wanting to know the results of her urine culture result.  Patient notified that the lab was unable to get a culture off the urine specimen that was collected and the lab suggested recollection.  Patient was informed that if she was still having her urinary symptoms that she should follow-up here or with her PCP for possible recollection and further evaluation.  Patient verbalized understanding.

## 2018-02-23 NOTE — Progress Notes (Signed)
02/24/2018 9:47 AM   Lori Knight 12-21-1970 [id number]  Referring provider: [name], MD [address] [place],  [address]  Chief Complaint  Patient presents with  . Recurrent UTI    New Patient    HPI: Patient is a 47 -year-old Caucasian female who is referred to [place] by Dr. Halina Maidens for recurrent urinary tract infections.  Patient states that she has had 3 to 4 urinary tract infections over the last year.  She states she has received antibiotics from tele docs, urgent care and PCP.    Reviewing her records,  she has had no documented positive urine cultures.  She did have 6-10 RBC's on an UA on 02/16/2018.    Her symptoms with a urinary tract infection consist of burning.    Today, she is experiencing frequency x 5-6, strong urgency, intermittent dysuria x 3 to 4 months, nocturia x 1 -3, SUI and urge incontinence wearing 2 pads during the day and one pad at night, intermittency and straining to urinate.   Patient denies any gross hematuria, dysuria or suprapubic/flank pain.  Patient denies any fevers, chills, nausea or vomiting.       She does not have a history of nephrolithiasis.  She states her bladder was "nicked" during her hysterectomy in 2011.  She states they kept having her sign a paper so she wouldn't sue.    She is not sexually active.  She admits to diarrhea.   She does not engage in good perineal hygiene. She does not take tub baths.   She is drinking 3 to 4 bottles of water daily.  She does not drink coffee.  She drinks two Cokes daily.  She drinks un sweet tea.  She drinks cranberry juice when she feels she has an infection.  She does not drink on alcohol.     She is a former smoker.  Quit in 2004.  Her PVR was 50 mL.  Her CATH UA was positive for 0-5 WBC's and 0-5 RBC's.    PMH: Past Medical History:  Diagnosis Date  . Adult hypothyroidism 06/21/2012  . Bilateral polycystic ovarian syndrome 06/21/2012  . Biliary  calculus with cholecystitis 08/14/2012  . Bipolar 1 disorder ([place])   . BP (high blood pressure) 02/14/2014  . Diabetes mellitus ([place]) 08/14/2012  . Diabetes mellitus without complication ([place])    pre-diabetic  . Dysrhythmia    wore heart monitor 2016. Corrected by changing Levothyroxine dose.  Marland Kitchen Heart murmur    followed by PCP  . Hyperprolactinemia ([place]) 06/21/2012  . Hypertension   . Motion sickness    carnival rides  . PONV (postoperative nausea and vomiting)   . Shortness of breath dyspnea    stairs. related to wt.  . Sleep apnea    has CPAP. has not used since 2011  . Vertigo    2x in last yr    Surgical History: Past Surgical History:  Procedure Laterality Date  . ABDOMINAL HYSTERECTOMY  2012   cervical dysplasia/ovaries remian  . COLONOSCOPY WITH PROPOFOL N/A 06/19/2015   Procedure: COLONOSCOPY WITH PROPOFOL;  Surgeon: [name], MD;  Location: [facility];  Service: Endoscopy;  Laterality: N/A;  Diabetic - oral meds   . DENTAL SURGERY    . GALLBLADDER SURGERY      Home Medications:  Allergies as of 02/24/2018      Reactions   Hctz [hydrochlorothiazide] Cough   Lisinopril Cough   Latex Rash  Condoms only      Medication List        Accurate as of 02/24/18  9:47 AM. Always use your most recent med list.          albuterol 108 (90 Base) MCG/ACT inhaler Commonly known as:  PROVENTIL HFA;VENTOLIN HFA Inhale 2 puffs into the lungs every 4 (four) hours as needed for wheezing or shortness of breath.   Cholecalciferol 5000 units capsule Take 1 capsule (5,000 Units total) by mouth daily.   cyclobenzaprine 7.5 MG tablet Commonly known as:  FEXMID Take 7.5 mg by mouth 3 (three) times daily as needed for muscle spasms (PRN).   diclofenac 75 MG EC tablet Commonly known as:  VOLTAREN Take 75 mg by mouth 2 (two) times daily.   Fish Oil 1200 MG Caps Take by mouth.   fluticasone 50 MCG/ACT nasal spray Commonly known as:  FLONASE PLACE 2 SPRAYS DAILY INTO  BOTH NOSTRILS.   hydrochlorothiazide 25 MG tablet Commonly known as:  HYDRODIURIL TAKE 1 TABLET BY MOUTH EVERY DAY   ibuprofen 800 MG tablet Commonly known as:  ADVIL,MOTRIN Take 1 tablet (800 mg total) by mouth every 8 (eight) hours as needed.   JANUVIA 100 MG tablet Generic drug:  sitaGLIPtin TAKE 1 TABLET (100 MG TOTAL) BY MOUTH DAILY.   lamoTRIgine 100 MG tablet Commonly known as:  LAMICTAL Take 300 mg by mouth daily.   levothyroxine 175 MCG tablet Commonly known as:  SYNTHROID, LEVOTHROID TAKE 1 TABLET (175 MCG TOTAL) BY MOUTH DAILY.   losartan 50 MG tablet Commonly known as:  COZAAR TAKE 1 TABLET (50 MG TOTAL) BY MOUTH 2 (TWO) TIMES DAILY.   Melatonin 5 MG Caps Take 15 mg by mouth Nightly.   metFORMIN 500 MG tablet Commonly known as:  GLUCOPHAGE Take 1 tablet (500 mg total) by mouth 3 (three) times daily.   multivitamin tablet Take 1 tablet by mouth daily.   ranitidine 150 MG tablet Commonly known as:  ZANTAC TAKE 1 TABLET BY MOUTH TWICE A DAY   vitamin C 1000 MG tablet Take 1,000 mg by mouth daily.       Allergies:  Allergies  Allergen Reactions  . Hctz [Hydrochlorothiazide] Cough  . Lisinopril Cough  . Latex Rash    Condoms only    Family History: Family History  Problem Relation Age of Onset  . Diabetes Maternal Grandmother   . Hypertension Maternal Grandmother   . Hyperlipidemia Maternal Grandmother   . Hypothyroidism Maternal Grandmother   . Diabetes Mother   . Hypertension Mother   . Hypothyroidism Mother   . Bipolar disorder Mother   . Breast cancer Neg Hx     Social History:  reports that she quit smoking about 15 years ago. Her smoking use included cigarettes. She has a 10.00 pack-year smoking history. She has never used smokeless tobacco. She reports that she drinks alcohol. She reports that she does not use drugs.  ROS: UROLOGY Frequent Urination?: Yes Hard to postpone urination?: Yes Burning/pain with urination?: Yes Get up  at night to urinate?: Yes Leakage of urine?: Yes Urine stream starts and stops?: Yes Trouble starting stream?: No Do you have to strain to urinate?: Yes Blood in urine?: No Urinary tract infection?: Yes Sexually transmitted disease?: No Injury to kidneys or bladder?: No Painful intercourse?: No Weak stream?: No Currently pregnant?: No Vaginal bleeding?: No Last menstrual period?: n  Gastrointestinal Nausea?: Yes Vomiting?: Yes Indigestion/heartburn?: Yes Diarrhea?: Yes Constipation?: No  Constitutional Fever: No Night  sweats?: Yes Weight loss?: No Fatigue?: Yes  Skin Skin rash/lesions?: No Itching?: No  Eyes Blurred vision?: Yes Double vision?: No  Ears/Nose/Throat Sore throat?: Yes Sinus problems?: Yes  Hematologic/Lymphatic Swollen glands?: No Easy bruising?: No  Cardiovascular Leg swelling?: Yes Chest pain?: No  Respiratory Cough?: Yes Shortness of breath?: Yes  Endocrine Excessive thirst?: Yes  Musculoskeletal Back pain?: Yes Joint pain?: Yes  Neurological Headaches?: Yes Dizziness?: Yes  Psychologic Depression?: Yes Anxiety?: Yes  Physical Exam: Ht 5\' 6"  (1.676 m)   Wt 300 lb (136.1 kg)   BMI 48.42 kg/m   Constitutional:  Well nourished. Alert and oriented, No acute distress. HEENT: [place] AT, moist mucus membranes.  Trachea midline, no masses. Cardiovascular: No clubbing, cyanosis, or edema. Respiratory: Normal respiratory effort, no increased work of breathing. GI: Abdomen is soft, non tender, non distended, no abdominal masses. Liver and spleen not palpable.  No hernias appreciated.  Stool sample for occult testing is not indicated.   GU: No CVA tenderness.  No bladder fullness or masses.  Normal external genitalia, normal pubic hair distribution, no lesions.  Normal urethral meatus, no lesions, no prolapse, no discharge.   No urethral masses, tenderness and/or tenderness. No bladder fullness, tenderness or masses. Normal vagina mucosa,  good estrogen effect, no discharge, no lesions, good pelvic support, no cystocele or rectocele noted.  Cervix and uterus are surgically absent.  No adnexal/parametria masses or tenderness noted.  Anus and perineum are without rashes or lesions.    Skin: No rashes, bruises or suspicious lesions. Lymph: No cervical or inguinal adenopathy. Neurologic: Grossly intact, no focal deficits, moving all 4 extremities. Psychiatric: Normal mood and affect.  Laboratory Data: Lab Results  Component Value Date   WBC 10.6 09/18/2015   HGB 14.6 09/18/2015   HCT 43.6 09/18/2015   MCV 88.3 09/18/2015   PLT 270 09/18/2015    Lab Results  Component Value Date   CREATININE 0.82 12/26/2017    No results found for: PSA  No results found for: TESTOSTERONE  Lab Results  Component Value Date   HGBA1C 7.1 (H) 12/26/2017    Lab Results  Component Value Date   TSH 4.680 (H) 12/26/2017    No results found for: CHOL, HDL, CHOLHDL, VLDL, LDLCALC  Lab Results  Component Value Date   AST 22 04/14/2017   Lab Results  Component Value Date   ALT 33 (H) 04/14/2017   No components found for: ALKALINEPHOPHATASE No components found for: BILIRUBINTOTAL  No results found for: ESTRADIOL  Urinalysis 0-5 WBC's and 0-5 RBC's.  See Epic and HPI.   I have reviewed the labs.   Assessment & Plan:    1. Microscopic hematuria I explained to the patient that there are a number of causes that can be associated with blood in the urine, such as stones, UTI's, damage to the urinary tract and/or cancer. At this time, I felt that the patient warranted further urologic evaluation with 3 or greater RBC's/hpf on microscopic evaluation of the urine.  The AUA guidelines state that a CT urogram is the preferred imaging study to evaluate hematuria. I explained to the patient that a contrast material will be injected into a vein and that in rare instances, an allergic reaction can result and may even life threatening   The  patient denies any allergies to contrast, iodine and/or seafood and is not taking metformin. Following the imaging study,  I've recommended a cystoscopy. I described how this is performed, typically in an office  setting with a flexible cystoscope. We described the risks, benefits, and possible side effects, the most common of which is a minor amount of blood in the urine and/or burning which usually resolves in 24 to 48 hours.   The patient had the opportunity to ask questions which were answered. Based upon this discussion, the patient is willing to proceed. Therefore, I've ordered: a CT Urogram and cystoscopy. The patient will return following all of the above for discussion of the results.  UA Urine culture BUN + creatinine    2. Mixed incontinence Discussion behavioral therapies, bladder training and bladder control strategies Pelvic floor muscle training - patient would like referral  Fluid management - avoid Cokes  She does not want any more medication at this time RTC in 6 weeks for PVR and symptom recheck   3. Nocturia Has sleep apnea, but she cannot tolerate the mask She is currently being evaluated by ENT   Return for CT Urogram report and cystoscopy.  These notes generated with voice recognition software. I apologize for typographical errors.  Zara Council, PA-C  [facility] [address]  [place], [place] [address] [phone]

## 2018-02-24 ENCOUNTER — Other Ambulatory Visit
Admission: RE | Admit: 2018-02-24 | Discharge: 2018-02-24 | Disposition: A | Discharge status: Home / Self Care | Payer: BLUE CROSS/BLUE SHIELD | Source: Ambulatory Visit | Attending: Urology | Admitting: Urology

## 2018-02-24 ENCOUNTER — Ambulatory Visit (INDEPENDENT_AMBULATORY_CARE_PROVIDER_SITE_OTHER): Payer: BLUE CROSS/BLUE SHIELD | Admitting: Urology

## 2018-02-24 ENCOUNTER — Encounter: Payer: Self-pay | Admitting: Urology

## 2018-02-24 VITALS — Ht 66.0 in | Wt 300.0 lb

## 2018-02-24 DIAGNOSIS — R3129 Other microscopic hematuria: Secondary | ICD-10-CM

## 2018-02-24 DIAGNOSIS — N3946 Mixed incontinence: Secondary | ICD-10-CM | POA: Diagnosis not present

## 2018-02-24 DIAGNOSIS — R351 Nocturia: Secondary | ICD-10-CM | POA: Diagnosis not present

## 2018-02-24 LAB — URINALYSIS, COMPLETE (UACMP) WITH MICROSCOPIC
BACTERIA UA: NONE SEEN
Bilirubin Urine: NEGATIVE
GLUCOSE, UA: NEGATIVE mg/dL
Hgb urine dipstick: NEGATIVE
KETONES UR: NEGATIVE mg/dL
LEUKOCYTES UA: NEGATIVE
Nitrite: NEGATIVE
PROTEIN: 100 mg/dL — AB
Specific Gravity, Urine: 1.02 (ref 1.005–1.030)
pH: 6 (ref 5.0–8.0)

## 2018-02-24 NOTE — Progress Notes (Signed)
In and Out Catheterization  Patient is present today for a I & O catheterization due to recurrent UTI. Patient was cleaned and prepped in a sterile fashion with betadine and Lidocaine 2% jelly was instilled into the urethra.  A 14FR cath was inserted no complications were noted , 67ml of urine return was noted, urine was yellow in color. A clean urine sample was collected for UA and culture. Bladder was drained  And catheter was removed with out difficulty.    Preformed by: Fonnie Jarvis, CMA

## 2018-02-25 LAB — URINE CULTURE: Culture: NO GROWTH

## 2018-02-27 ENCOUNTER — Telehealth: Payer: Self-pay

## 2018-02-27 NOTE — Telephone Encounter (Signed)
Called pt informed her of information below. Pt gave verbal understanding.  

## 2018-02-27 NOTE — Telephone Encounter (Signed)
-----   Message from Nori Riis, PA-C sent at 02/26/2018  8:22 PM EDT ----- Please let Lori Knight know that her urine culture was negative.

## 2018-02-28 DIAGNOSIS — J328 Other chronic sinusitis: Secondary | ICD-10-CM | POA: Diagnosis not present

## 2018-02-28 DIAGNOSIS — J3489 Other specified disorders of nose and nasal sinuses: Secondary | ICD-10-CM | POA: Diagnosis not present

## 2018-02-28 DIAGNOSIS — J342 Deviated nasal septum: Secondary | ICD-10-CM | POA: Diagnosis not present

## 2018-02-28 DIAGNOSIS — J343 Hypertrophy of nasal turbinates: Secondary | ICD-10-CM | POA: Diagnosis not present

## 2018-03-17 ENCOUNTER — Ambulatory Visit
Admission: RE | Admit: 2018-03-17 | Discharge: 2018-03-17 | Disposition: A | Discharge status: Home / Self Care | Payer: BLUE CROSS/BLUE SHIELD | Source: Ambulatory Visit | Attending: Urology | Admitting: Urology

## 2018-03-17 DIAGNOSIS — I700 Atherosclerosis of aorta: Secondary | ICD-10-CM | POA: Insufficient documentation

## 2018-03-17 DIAGNOSIS — N200 Calculus of kidney: Secondary | ICD-10-CM | POA: Diagnosis not present

## 2018-03-17 DIAGNOSIS — R3129 Other microscopic hematuria: Secondary | ICD-10-CM | POA: Diagnosis not present

## 2018-03-17 DIAGNOSIS — D1771 Benign lipomatous neoplasm of kidney: Secondary | ICD-10-CM | POA: Insufficient documentation

## 2018-03-17 DIAGNOSIS — K760 Fatty (change of) liver, not elsewhere classified: Secondary | ICD-10-CM | POA: Insufficient documentation

## 2018-03-17 LAB — POCT I-STAT CREATININE: Creatinine, Ser: 0.8 mg/dL (ref 0.44–1.00)

## 2018-03-17 MED ORDER — IOPAMIDOL (ISOVUE-370) INJECTION 76%
100.0000 mL | Freq: Once | INTRAVENOUS | Status: AC | PRN
  Administered 2018-03-17: 100 mL via INTRAVENOUS

## 2018-03-20 ENCOUNTER — Encounter: Payer: Self-pay | Admitting: Internal Medicine

## 2018-03-21 ENCOUNTER — Ambulatory Visit: Payer: BLUE CROSS/BLUE SHIELD

## 2018-03-21 ENCOUNTER — Other Ambulatory Visit: Payer: Self-pay

## 2018-03-21 DIAGNOSIS — R5383 Other fatigue: Secondary | ICD-10-CM

## 2018-03-21 NOTE — Telephone Encounter (Signed)
Patient wants to have a iron test. Please Advise.

## 2018-03-21 NOTE — Telephone Encounter (Signed)
Patient response

## 2018-03-22 DIAGNOSIS — R5383 Other fatigue: Secondary | ICD-10-CM | POA: Diagnosis not present

## 2018-03-23 ENCOUNTER — Encounter: Payer: Self-pay | Admitting: Internal Medicine

## 2018-03-23 LAB — CBC WITH DIFFERENTIAL/PLATELET
Basophils Absolute: 0.1 10*3/uL (ref 0.0–0.2)
Basos: 1 %
EOS (ABSOLUTE): 0.2 10*3/uL (ref 0.0–0.4)
EOS: 1 %
HEMATOCRIT: 43.5 % (ref 34.0–46.6)
HEMOGLOBIN: 14 g/dL (ref 11.1–15.9)
IMMATURE GRANS (ABS): 0.2 10*3/uL — AB (ref 0.0–0.1)
IMMATURE GRANULOCYTES: 2 %
LYMPHS: 23 %
Lymphocytes Absolute: 2.4 10*3/uL (ref 0.7–3.1)
MCH: 28.2 pg (ref 26.6–33.0)
MCHC: 32.2 g/dL (ref 31.5–35.7)
MCV: 88 fL (ref 79–97)
Monocytes Absolute: 0.9 10*3/uL (ref 0.1–0.9)
Monocytes: 8 %
NEUTROS ABS: 6.7 10*3/uL (ref 1.4–7.0)
NEUTROS PCT: 65 %
Platelets: 311 10*3/uL (ref 150–450)
RBC: 4.97 x10E6/uL (ref 3.77–5.28)
RDW: 14.3 % (ref 12.3–15.4)
WBC: 10.4 10*3/uL (ref 3.4–10.8)

## 2018-03-24 NOTE — Telephone Encounter (Signed)
Patient response to you. Please Advise.

## 2018-03-28 ENCOUNTER — Ambulatory Visit: Payer: BLUE CROSS/BLUE SHIELD

## 2018-03-30 ENCOUNTER — Other Ambulatory Visit: Payer: Self-pay

## 2018-03-30 ENCOUNTER — Encounter: Payer: Self-pay | Admitting: Emergency Medicine

## 2018-03-30 ENCOUNTER — Ambulatory Visit
Admission: EM | Admit: 2018-03-30 | Discharge: 2018-03-30 | Disposition: A | Discharge status: Home / Self Care | Payer: BLUE CROSS/BLUE SHIELD | Attending: Emergency Medicine | Admitting: Emergency Medicine

## 2018-03-30 DIAGNOSIS — K219 Gastro-esophageal reflux disease without esophagitis: Secondary | ICD-10-CM | POA: Diagnosis not present

## 2018-03-30 MED ORDER — BENZONATATE 200 MG PO CAPS: 200.0000 mg | ORAL_CAPSULE | Freq: Three times a day (TID) | ORAL | 0 refills | Status: DC | PRN

## 2018-03-30 NOTE — Discharge Instructions (Addendum)
Try either full dose of Maalox or you can try the Benadryl/Maalox mixture that we discussed.  5 mL of Benadryl mixed with 5 mL of Maalox.  This will help with sore throat.  Tessalon might help dampen your cough reflux, I would take this in the morning when your symptoms are bothering you.

## 2018-03-30 NOTE — ED Triage Notes (Signed)
Patient c/o sore throat that started 2 days ago. Patient stated this morning she had an episode of vomiting from her reflux that was pink tinged.

## 2018-03-30 NOTE — ED Provider Notes (Signed)
HPI  SUBJECTIVE:  Lori Knight is a 47 y.o. female who presents with vomiting with posttussive emesis due to GERD which is severe in the morning for the past year and a half. she is seeing ENT for this, and has tried lifestyle modifications such as sleeping with her head upright, no eating 3 hours before bed, etc.  She is tolerating p.o. otherwise.  She reports a mild sore throat today.  She states that her GERD is always worse in the morning, throat itching and irritated, "tickle in the back of my throat", which produces a cough.  Reports a lot of nasal congestion, rhinorrhea, postnasal drip, phlegm in her throat.  Is taking Nasacort for this.  She is here today because she was brushing her teeth, which triggered her gag reflex, causing a cough, resulting in dry heaving.  Patient states that she produced some pink, yellowish tinged phlegm.  She denies actual vomiting.  She denies hemoptysis, vomiting blood, epistaxis, melena, hematochezia, abdominal pain.  She is not on any anticoagulants or antiplatelets.  There are no other aggravating or alleviating factors.  She has tried drinking water without improvement in her symptoms.  She has a past medical history of GERD, diabetes, hypertension, allergies, OSA, but has not been using the CPAP.  No history of alcohol abuse, esophageal varices, peptic ulcer disease, GI bleed.  PMD: .[name], MD .  She is currently being worked up by urology for hematuria.  She is also being followed by ENT.  LMP: Status post hysterectomy.    Past Medical History:  Diagnosis Date  . Adult hypothyroidism 06/21/2012  . Bilateral polycystic ovarian syndrome 06/21/2012  . Biliary calculus with cholecystitis 08/14/2012  . Bipolar 1 disorder ([place])   . BP (high blood pressure) 02/14/2014  . Diabetes mellitus ([place]) 08/14/2012  . Diabetes mellitus without complication ([place])    pre-diabetic  . Dysrhythmia    wore heart monitor 2016. Corrected by changing Levothyroxine  dose.  Marland Kitchen Heart murmur    followed by PCP  . Hyperprolactinemia ([place]) 06/21/2012  . Hypertension   . Motion sickness    carnival rides  . PONV (postoperative nausea and vomiting)   . Shortness of breath dyspnea    stairs. related to wt.  . Sleep apnea    has CPAP. has not used since 2011  . Vertigo    2x in last yr    Past Surgical History:  Procedure Laterality Date  . ABDOMINAL HYSTERECTOMY  2012   cervical dysplasia/ovaries remian  . COLONOSCOPY WITH PROPOFOL N/A 06/19/2015   Procedure: COLONOSCOPY WITH PROPOFOL;  Surgeon: [name], MD;  Location: [facility];  Service: Endoscopy;  Laterality: N/A;  Diabetic - oral meds   . DENTAL SURGERY    . GALLBLADDER SURGERY      Family History  Problem Relation Age of Onset  . Diabetes Maternal Grandmother   . Hypertension Maternal Grandmother   . Hyperlipidemia Maternal Grandmother   . Hypothyroidism Maternal Grandmother   . Diabetes Mother   . Hypertension Mother   . Hypothyroidism Mother   . Bipolar disorder Mother   . Breast cancer Neg Hx     Social History   Tobacco Use  . Smoking status: Former Smoker    Packs/day: 1.00    Years: 10.00    Pack years: 10.00    Types: Cigarettes    Last attempt to quit: 07/12/2002    Years since quitting: 15.7  . Smokeless tobacco: Never Used  .  Tobacco comment: quit 2004  Substance Use Topics  . Alcohol use: Yes    Alcohol/week: 0.0 standard drinks    Comment: rarely  . Drug use: No    No current facility-administered medications for this encounter.   Current Outpatient Medications:  .  albuterol (PROVENTIL HFA;VENTOLIN HFA) 108 (90 Base) MCG/ACT inhaler, Inhale 2 puffs into the lungs every 4 (four) hours as needed for wheezing or shortness of breath., Disp: 1 Inhaler, Rfl: 0 .  Ascorbic Acid (VITAMIN C) 1000 MG tablet, Take 1,000 mg by mouth daily., Disp: , Rfl:  .  Cholecalciferol 5000 units capsule, Take 1 capsule (5,000 Units total) by mouth daily., Disp: , Rfl:   .  cyclobenzaprine (FEXMID) 7.5 MG tablet, Take 7.5 mg by mouth 3 (three) times daily as needed for muscle spasms (PRN). , Disp: , Rfl:  .  diclofenac (VOLTAREN) 75 MG EC tablet, Take 75 mg by mouth 2 (two) times daily., Disp: , Rfl: 1 .  hydrochlorothiazide (HYDRODIURIL) 25 MG tablet, TAKE 1 TABLET BY MOUTH EVERY DAY, Disp: 90 tablet, Rfl: 1 .  ibuprofen (ADVIL,MOTRIN) 800 MG tablet, Take 1 tablet (800 mg total) by mouth every 8 (eight) hours as needed., Disp: 30 tablet, Rfl: 0 .  JANUVIA 100 MG tablet, TAKE 1 TABLET (100 MG TOTAL) BY MOUTH DAILY., Disp: 90 tablet, Rfl: 3 .  lamoTRIgine (LAMICTAL) 100 MG tablet, Take 300 mg by mouth daily. , Disp: , Rfl:  .  levothyroxine (SYNTHROID, LEVOTHROID) 175 MCG tablet, TAKE 1 TABLET (175 MCG TOTAL) BY MOUTH DAILY., Disp: 30 tablet, Rfl: 12 .  losartan (COZAAR) 50 MG tablet, TAKE 1 TABLET (50 MG TOTAL) BY MOUTH 2 (TWO) TIMES DAILY., Disp: 180 tablet, Rfl: 1 .  Melatonin 5 MG CAPS, Take 15 mg by mouth Nightly. , Disp: , Rfl:  .  metFORMIN (GLUCOPHAGE) 500 MG tablet, Take 1 tablet (500 mg total) by mouth 3 (three) times daily., Disp: 270 tablet, Rfl: 0 .  Multiple Vitamin (MULTIVITAMIN) tablet, Take 1 tablet by mouth daily., Disp: , Rfl:  .  Omega-3 Fatty Acids (FISH OIL) 1200 MG CAPS, Take by mouth., Disp: , Rfl:  .  ranitidine (ZANTAC) 150 MG tablet, TAKE 1 TABLET BY MOUTH TWICE A DAY, Disp: 60 tablet, Rfl: 5 .  triamcinolone (NASACORT ALLERGY 24HR) 55 MCG/ACT AERO nasal inhaler, Place 2 sprays into the nose daily., Disp: , Rfl:  .  benzonatate (TESSALON) 200 MG capsule, Take 1 capsule (200 mg total) by mouth 3 (three) times daily as needed for cough., Disp: 30 capsule, Rfl: 0 .  fluticasone (FLONASE) 50 MCG/ACT nasal spray, PLACE 2 SPRAYS DAILY INTO BOTH NOSTRILS., Disp: 16 g, Rfl: 1  Allergies  Allergen Reactions  . Hctz [Hydrochlorothiazide] Cough  . Lisinopril Cough  . Latex Rash    Condoms only     ROS  As noted in HPI.   Physical  Exam  BP (!) 183/101 (BP Location: Right Arm) Comment: patient has not taken BP medication this morning  Pulse 86   Temp 98 F (36.7 C) (Oral)   Resp 18   Ht 5\' 6"  (1.676 m)   Wt 136.1 kg   SpO2 98%   BMI 48.42 kg/m   Constitutional: Well developed, well nourished, no acute distress Eyes:  EOMI, conjunctiva normal bilaterally HENT: Normocephalic, atraumatic,mucus membranes moist.  Erythematous, swollen turbinates with clear nasal congestion.  No epistaxis.  No blood in the oropharynx. Respiratory: Normal inspiratory effort Cardiovascular: Normal rate GI: nondistended, soft, nontender,  active bowel sounds.  No guarding, rebound. skin: No rash, skin intact Musculoskeletal: no deformities Neurologic: Alert & oriented x 3, no focal neuro deficits Psychiatric: Speech and behavior appropriate   ED Course   Medications - No data to display  No orders of the defined types were placed in this encounter.   No results found for this or any previous visit (from the past 24 hour(s)). No results found.  ED Clinical Impression  Gastroesophageal reflux disease, esophagitis presence not specified   ED Assessment/Plan  Suspect that the patient's throat is irritated, doubt GI bleed, variceal bleed.  Suspect that the cough with posttussive emesis is coming from GERD and she has no respiratory complaints.  She is not on any antiplatelets or anticoagulants.[place]es Benadryl/Maalox mixture or full dose of Maalox in the morning, help tamp down her GERD symptoms.  We will also try Tessalon in the morning to see if that does not desensitize her cough reflex.  Blood pressure noted.  Patient is currently asymptomatic.  Not take blood pressure medicine this morning.  States that she will take it as soon as she leaves.  Denies headache, strokelike symptoms, chest pain, difference in her baseline shortness of breath, lower extremity edema, abdominal pain, anuria.  She is currently being worked up for  hematuria.  Discussed MDM, treatment plan, and plan for follow-up with patient. Discussed sn/sx that should prompt return to the ED. patient agrees with plan.   Meds ordered this encounter  Medications  . benzonatate (TESSALON) 200 MG capsule    Sig: Take 1 capsule (200 mg total) by mouth 3 (three) times daily as needed for cough.    Dispense:  30 capsule    Refill:  0    *This clinic note was created using [employer]. Therefore, there may be occasional mistakes despite careful proofreading.   ?   Melynda Ripple, MD 03/30/18 [phone]

## 2018-04-04 DIAGNOSIS — J343 Hypertrophy of nasal turbinates: Secondary | ICD-10-CM | POA: Diagnosis not present

## 2018-04-04 DIAGNOSIS — J328 Other chronic sinusitis: Secondary | ICD-10-CM | POA: Diagnosis not present

## 2018-04-04 DIAGNOSIS — J342 Deviated nasal septum: Secondary | ICD-10-CM | POA: Diagnosis not present

## 2018-04-04 DIAGNOSIS — J3489 Other specified disorders of nose and nasal sinuses: Secondary | ICD-10-CM | POA: Diagnosis not present

## 2018-04-10 ENCOUNTER — Encounter
Admission: RE | Admit: 2018-04-10 | Discharge: 2018-04-10 | Disposition: A | Discharge status: Home / Self Care | Payer: BLUE CROSS/BLUE SHIELD | Source: Ambulatory Visit | Attending: Otolaryngology | Admitting: Otolaryngology

## 2018-04-10 ENCOUNTER — Other Ambulatory Visit: Payer: Self-pay

## 2018-04-10 HISTORY — DX: Bronchitis, not specified as acute or chronic: J40

## 2018-04-10 NOTE — Patient Instructions (Addendum)
Your procedure is scheduled on: 04-17-18 MONDAY Report to Same Day Surgery 2nd floor medical mall [facility] Entrance-take elevator on left to 2nd floor.  Check in with surgery information desk.) To find out your arrival time please call [phone] between 1PM - 3PM on 04-14-18 FRIDAY  Remember: Instructions that are not followed completely may result in serious medical risk, up to and including death, or upon the discretion of your surgeon and anesthesiologist your surgery may need to be rescheduled.    _x___ 1. Do not eat food after midnight the night before your procedure. NO GUM OR CANDY AFTER MIDNIGHT.  You may drink WATER up to 2 hours before you are scheduled to arrive at the hospital for your procedure.  Do not drink WATER within 2 hours of your scheduled arrival to the hospital.  Type 1 and type 2 diabetics should only drink water.   ____Ensure clear carbohydrate drink on the way to the hospital for bariatric patients  ____Ensure clear carbohydrate drink 3 hours before surgery for Dr Dwyane Luo patients if physician instructed.    __x__ 2. No Alcohol for 24 hours before or after surgery.   __x__3. No Smoking or e-cigarettes for 24 prior to surgery.  Do not use any chewable tobacco products for at least 6 hour prior to surgery   ____  4. Bring all medications with you on the day of surgery if instructed.    __x__ 5. Notify your doctor if there is any change in your medical condition     (cold, fever, infections).    x___6. On the morning of surgery brush your teeth with toothpaste and water.  You may rinse your mouth with mouth wash if you wish.  Do not swallow any toothpaste or mouthwash.   Do not wear jewelry, make-up, hairpins, clips or nail polish.  Do not wear lotions, powders, or perfumes. You may wear deodorant.  Do not shave 48 hours prior to surgery. Men may shave face and neck.  Do not bring valuables to the hospital.    [facility] is not responsible for any  belongings or valuables.               Contacts, dentures or bridgework may not be worn into surgery.  Leave your suitcase in the car. After surgery it may be brought to your room.  For patients admitted to the hospital, discharge time is determined by your  treatment team.  _  Patients discharged the day of surgery will not be allowed to drive home.  You will need someone to drive you home and stay with you the night of your procedure.    Please read over the following fact sheets that you were given:   [facility] Preparing for Surgery   _x___ TAKE THE FOLLOWING MEDICATION THE MORNING OF SURGERY WITH SMALL SIP OF WATER. These include:  1. LEVOTHYROXINE  2. LAMICTAL  3. ZANTAC  4.  5.  6.  ____Fleets enema or Magnesium Citrate as directed.   ____ Use CHG Soap or sage wipes as directed on instruction sheet   _X___ Use inhalers on the day of surgery and bring to hospital day of surgery-USE YOUR ALBUTEROL [place]  _X___ Stop Metformin 2 days prior to surgery-LAST DOSE ON Friday, October 4TH    ____ Take 1/2 of usual insulin dose the night before surgery and none on the morning surgery.   ____ Follow recommendations from Cardiologist, Pulmonologist or PCP regarding  stopping Aspirin, Coumadin, Plavix ,Eliquis, Effient, or Pradaxa, and Pletal.  X____Stop Anti-inflammatories such as Advil, Aleve, Ibuprofen, Motrin, Naproxen, DICLOFENAC,  Naprosyn, Goodies powders or aspirin products NOW-OK to take Tylenol    _x___ Stop supplements until after surgery-STOP FISH OIL NOW-MAY RESUME AFTER SURGERY   ____ Bring C-Pap to the hospital.

## 2018-04-11 ENCOUNTER — Encounter: Payer: Self-pay | Admitting: Urology

## 2018-04-11 ENCOUNTER — Ambulatory Visit (INDEPENDENT_AMBULATORY_CARE_PROVIDER_SITE_OTHER): Payer: BLUE CROSS/BLUE SHIELD | Admitting: Urology

## 2018-04-11 VITALS — BP 159/94 | HR 92 | Resp 16 | Ht 67.0 in | Wt 300.5 lb

## 2018-04-11 DIAGNOSIS — N393 Stress incontinence (female) (male): Secondary | ICD-10-CM

## 2018-04-11 DIAGNOSIS — D494 Neoplasm of unspecified behavior of bladder: Secondary | ICD-10-CM

## 2018-04-11 DIAGNOSIS — D1771 Benign lipomatous neoplasm of kidney: Secondary | ICD-10-CM | POA: Diagnosis not present

## 2018-04-11 DIAGNOSIS — R3129 Other microscopic hematuria: Secondary | ICD-10-CM | POA: Diagnosis not present

## 2018-04-11 LAB — URINALYSIS, COMPLETE
Bilirubin, UA: NEGATIVE
Glucose, UA: NEGATIVE
Leukocytes, UA: NEGATIVE
NITRITE UA: NEGATIVE
RBC UA: NEGATIVE
Specific Gravity, UA: >1.03 — ABNORMAL HIGH (ref 1.005–1.030)
UUROB: 0.2 mg/dL (ref 0.2–1.0)
pH, UA: 5.5 (ref 5.0–7.5)

## 2018-04-11 LAB — MICROSCOPIC EXAMINATION
RBC MICROSCOPIC, UA: NONE SEEN /HPF (ref 0–2)
WBC UA: NONE SEEN /HPF (ref 0–5)

## 2018-04-11 NOTE — Progress Notes (Signed)
   04/11/18  CC:  Chief Complaint  Patient presents with  . Cysto    HPI: 47 year old female with history of microscopic hematuria and recurrent urinary tract infections who presents today for office cystoscopy to complete her hematuria work-up.  She underwent CT urogram on 03/17/2018 which shows an incidental 3.7 cm right lower pole angiomyolipoma and a 6 mm left angiomyolipoma.  She does report today that she has had increasing stress incontinence over the past year.  She has had significant weight gain this year.  Vitals:   04/11/18 1450  Weight: (!) 300 lb 8 oz (136.3 kg)  Height: 5\' 7"  (1.702 m)   NED. A&Ox3.   No respiratory distress   Abd soft, NT, ND Normal external genitalia with patent urethral meatus  Cystoscopy Procedure Note  Patient identification was confirmed, informed consent was obtained, and patient was prepped using Betadine solution.  Lidocaine jelly was administered per urethral meatus.     Procedure: - Flexible cystoscope introduced, without any difficulty.   - Thorough search of the bladder revealed:    normal urethral meatus    normal urothelium     no stones    no ulcers     Small 1 cm round papillary tumor at left dome of bladder highly concerning for bladder cancer    no urethral polyps    no trabeculation  - Ureteral orifices were normal in position and appearance.  Squamous metaplasia of bladder neck extending to the trigone appreciated.  Post-Procedure: - Patient tolerated the procedure well  Assessment/ Plan:  1. Microscopic hematuria Bladder tumor as below Angiomyolipoma as below - Urinalysis, Complete  2. Angiomyolipoma of both kidneys We discussed the benign nature of these tumors although they do have a propensity to bleed especially when greater than 4-5 cm We discussed signs and symptoms of spontaneous hemorrhage Given that the right-sided lesion on the larger size, does seem reasonable to follow-up with renal ultrasound in  6 months to assess for interval growth She is agreeable this plan will follow-up with renal ultrasound in 6 months  3. Bladder tumor Small 1 cm tumor at dome of bladder, highly concerning for urothelial carcinoma Recommend proceeding to the operating room for TURBT, small with instillation of intravesical mitomycin Risk and benefits of this were discussed in detail including risk of bleeding, infection, damage surrounding structures, bladder perforation, and irritative voiding symptoms All questions answered She will be having surgery on Monday with ENT here to [facility], will reach out to see if we can do a combined procedure Preop urine culture ordered today  4. Stress incontinence, female Likely exacerbated by recent weight gain Encourage weight loss and Kegel exercises   Return in about 6 months (around 10/11/2018) for RUS with Larene Beach.  [name], MD

## 2018-04-12 ENCOUNTER — Encounter: Payer: Self-pay | Admitting: Urology

## 2018-04-12 ENCOUNTER — Other Ambulatory Visit: Payer: Self-pay | Admitting: Radiology

## 2018-04-12 DIAGNOSIS — D494 Neoplasm of unspecified behavior of bladder: Secondary | ICD-10-CM

## 2018-04-13 ENCOUNTER — Encounter
Admission: RE | Admit: 2018-04-13 | Discharge: 2018-04-13 | Disposition: A | Discharge status: Home / Self Care | Payer: BLUE CROSS/BLUE SHIELD | Source: Ambulatory Visit | Attending: Otolaryngology | Admitting: Otolaryngology

## 2018-04-13 DIAGNOSIS — R9431 Abnormal electrocardiogram [ECG] [EKG]: Secondary | ICD-10-CM | POA: Insufficient documentation

## 2018-04-13 DIAGNOSIS — I10 Essential (primary) hypertension: Secondary | ICD-10-CM | POA: Diagnosis not present

## 2018-04-13 LAB — BASIC METABOLIC PANEL
Anion gap: 7 (ref 5–15)
BUN: 17 mg/dL (ref 6–20)
CHLORIDE: 104 mmol/L (ref 98–111)
CO2: 28 mmol/L (ref 22–32)
CREATININE: 0.78 mg/dL (ref 0.44–1.00)
Calcium: 9.1 mg/dL (ref 8.9–10.3)
GFR calc Af Amer: >60 mL/min (ref >60)
GFR calc non Af Amer: >60 mL/min (ref >60)
Glucose, Bld: 186 mg/dL — ABNORMAL HIGH (ref 70–99)
Potassium: 4.2 mmol/L (ref 3.5–5.1)
Sodium: 139 mmol/L (ref 135–145)

## 2018-04-13 NOTE — Pre-Procedure Instructions (Addendum)
CALLED DR Ronelle Nigh REGARDING ABNORMAL EKG-DR KEPHART WANTS MEDICAL CLEARANCE-ALL OFFICES CLOSED-WILL CALL SURGEONS OFFICE IN AM AND INFORM THEM PT NEEDS CLEARANCE

## 2018-04-13 NOTE — Pre-Procedure Instructions (Signed)
ECG 12 lead1/25/2014 [facility] Result Impression  Diagnosis Line: Normal sinus rhythm Normal ECG No previous ECGs available Confirmed by Delila Spence ([id number]) on 08/05/2012 11:20:00 AM  Result Narrative  Ventricular Rate: 67 Atrial Rate: 67 P-R Interval: 168 QRS Duration: 96 Q-T Interval: 440 QTC Calculation(Bezet): 464 P Axis: 44 R Axis: 66 T Axis: 43  Status Results Details   Unavailable

## 2018-04-14 ENCOUNTER — Telehealth: Payer: Self-pay

## 2018-04-14 ENCOUNTER — Other Ambulatory Visit: Payer: Self-pay

## 2018-04-14 DIAGNOSIS — G4733 Obstructive sleep apnea (adult) (pediatric): Secondary | ICD-10-CM | POA: Diagnosis not present

## 2018-04-14 DIAGNOSIS — Z01818 Encounter for other preprocedural examination: Secondary | ICD-10-CM

## 2018-04-14 DIAGNOSIS — E119 Type 2 diabetes mellitus without complications: Secondary | ICD-10-CM | POA: Diagnosis not present

## 2018-04-14 DIAGNOSIS — I10 Essential (primary) hypertension: Secondary | ICD-10-CM | POA: Diagnosis not present

## 2018-04-14 DIAGNOSIS — R9431 Abnormal electrocardiogram [ECG] [EKG]: Secondary | ICD-10-CM

## 2018-04-14 DIAGNOSIS — R002 Palpitations: Secondary | ICD-10-CM | POA: Diagnosis not present

## 2018-04-14 LAB — CULTURE, URINE COMPREHENSIVE

## 2018-04-14 NOTE — Telephone Encounter (Signed)
LM that she will get a call from Pre Admit to reschedule Surgery since PCP unable to sign off on clearance due to Abnl EKG and differences in last EKG in care everywhere. Informed her that we will order Cardio appt and she should wait for call as to when and where.

## 2018-04-14 NOTE — Pre-Procedure Instructions (Signed)
CALLED AND SPOKE WITH DR Oneal Deputy NURSE ABOUT CLEARANCE-SHE SAID PT WAS LAST SEEN IN July. I TOLD HER I HAD FAXED OVER CLEARANCE REQUEST ALONG WITH EKG.  SHE SAID SHE WILL LOOK FOR IT AND CALL ME BACK ONCE DR [facility] LOOKS AT EKG

## 2018-04-14 NOTE — Pre-Procedure Instructions (Signed)
DR FATH CLEARED PT FOR SURGERY-CALLED AMY AT DR Audree Bane OFFICE AND SPOKE WITH TINA AT DR Delaine Lame OFFICE AND INFORMED THEM PT HAS BEEN CLEARED

## 2018-04-14 NOTE — Pre-Procedure Instructions (Signed)
Lori Knight [place], [place] - 04/14/2018 1:00 PM EDT Formatting of this note might be different from the original. Established Patient Visit   Chief Complaint: Chief Complaint  Patient presents with  . Pre-op Exam  clearance bladder and nose  Date of Service: 04/14/2018 Date of Birth: March 17, 1971 PCP: Eulah Pont, MD  History of Present Illness: Ms. Aden is a 47 y.o.female patient with past medical history significant for type 2 diabetes, hypertension, obstructive sleep apnea, and obesity who presents for preoperative cardiac clearance prior to transurethral resection of bladder tumor. Is a known patient of the clinic but hasn't been seen in a few years. Reports that she is doing well from a cardiac standpoint. Denies chest pain or lower extremity swelling. Has had no recurrence of palpitations. Does have exertional shortness of breath, more noticeable since weight gain. Is actively trying to lose weight through dietary modifications. Has lost 8 pounds since eliminating soda from her diet. Also denies any dizziness, lightheadedness, recent falls, or syncopal episodes.   ECG on 04/13/18 revealed normal sinus rhythm with no acute ischemic changes. Patient is cleared to proceed with surgery from a cardiac standpoint.   Past Medical and Surgical History  Past Medical History Past Medical History:  Diagnosis Date  . Bipolar disorder (CMS-HCC)  . Diabetes mellitus type 2, controlled, without complications (CMS-HCC) 11/09/256  . Hyperprolactinemia (CMS-HCC)  . Hypertension  . Hypothyroidism  . PCOS (polycystic ovarian syndrome)  . Sleep apnea  . Tachycardia   Past Surgical History She has a past surgical history that includes Hysterectomy Vaginal (2012) and cholecystectomy (2013).   Medications and Allergies  Current Medications  Current Outpatient Medications  Medication Sig Dispense Refill  . albuterol 90 mcg/actuation inhaler Inhale into the lungs  . ascorbic acid, vitamin  C, (VITAMIN C) 1000 MG tablet Take 1,000 mg by mouth once daily  . cholecalciferol (VITAMIN D3) 5,000 unit capsule Take 2 capsules by mouth once daily  . cyclobenzaprine (FLEXERIL) 5 MG tablet Take 5 mg by mouth once daily as needed 0  . diclofenac (VOLTAREN) 75 MG EC tablet Take 75 mg by mouth 2 (two) times daily as needed 1  . duke's magic mouthwash suspension SWISH AND SPIT 3 TIMES DAILY 2  . ibuprofen (ADVIL,MOTRIN) 200 MG tablet Take by mouth once daily as needed  . JANUVIA 100 mg tablet Take 1 tablet by mouth once daily 3  . lamoTRIgine (LAMICTAL) 100 MG tablet Take 3 tablets by mouth once daily  . levothyroxine (SYNTHROID, LEVOTHROID) 175 MCG tablet Take 175 mcg by mouth once daily  . losartan (COZAAR) 50 MG tablet Take 1 tablet by mouth 2 (two) times daily  . metFORMIN (GLUCOPHAGE) 500 MG tablet Take 1 tablet by mouth 3 (three) times daily  . multivitamin (MULTIVITAMIN) tablet Take by mouth  . omega-3 fatty acids-fish oil (FISH OIL) 360-1,200 mg Cap Take 1 capsule by mouth once daily  . ranitidine (ZANTAC) 150 MG tablet Take 150 mg by mouth 2 (two) times daily 5  . ziprasidone (GEODON) 60 MG capsule Take 60 mg by mouth once daily. Reported on 09/03/2015   No current facility-administered medications for this visit.   Allergies: Lisinopril; Hydrochlorothiazide; and Latex  Social and Family History  Social History reports that she quit smoking about 15 years ago. She has never used smokeless tobacco. She reports that she drinks about 0.6 oz of alcohol per week. She reports that she does not use drugs.  Family History Family History  Problem Relation  Age of Onset  . Diabetes Mother  . High blood pressure (Hypertension) Mother  . Heart disease Mother  . Hypothyroidism Mother  . Bipolar disorder Mother  . Diabetes Maternal Grandmother  . High blood pressure (Hypertension) Maternal Grandmother  . Heart disease Maternal Grandmother  . Hypothyroidism Maternal Grandmother   Review  of Systems   Review of Systems: The patient denies chest pain, shortness of breath, orthopnea, paroxysmal nocturnal dyspnea, pedal edema, palpitations, heart racing, fatigue, dizziness, lightheadedness, presyncope, syncope, leg pain, leg cramping. Review of 12 Systems is negative except as described in HPI.   Physical Examination   Vitals:BP 124/72  Pulse 76  Resp 16  Ht 168.9 cm (5' 6.5")  Wt (!) 134.4 kg (296 lb 4.8 oz)  BMI 47.11 kg/m  Ht:168.9 cm (5' 6.5") Wt:(!) 134.4 kg (296 lb 4.8 oz) ZOX:WRUE surface area is 2.51 meters squared. Body mass index is 47.11 kg/m.  General: Well developed, well nourished. In no acute distress HEENT: Pupils equally reactive to light and accomodation  Neck: Supple without thyromegaly, or goiter. Carotid pulses 2+. No carotid bruits present.  Pulmonary: Clear to auscultation bilaterally; no wheezes, rales, rhonchi Cardiovascular: Regular rate and rhythm. No gallops, murmurs or rubs Gastrointestinal: Soft nontender, nondistended, with normal bowel sounds Extremities: No cyanosis, clubbing, or edema Peripheral Pulses: 2+ in upper extremities, 2+ in lower extremities  Neurology: Alert and oriented X3 Pysch: Good affect. Responds appropriately  ECG on 04/13/18: Normal sinus rhythm with no acute ischemic changes Vent rate: 65 bpm, PR interval: 170 ms, QRS duration: 92 ms, QT/QTc: 438/455 ms Assessment   47 y.o. female with  1. Essential hypertension  2. OSA on CPAP  3. Controlled type 2 diabetes mellitus without complication, without long-term current use of insulin (CMS-HCC)  4. Heart palpitations  5. Obesity, Class III, BMI 40-49.9 (morbid obesity) (CMS-HCC)  6. Preoperative cardiovascular examination   Plan  1. Essential hypertension -Continue with losartan 50 mg  -Low-sodium diet encouraged 2. Obstructive sleep apnea -Continue with nightly compliance of CPAP machine -Weight loss encouraged 3. Type 2 diabetes -Continue with metformin and  Januvia with routine monitoring by PCP -A1C goal <6.0 4. Heart palpitations -Have resolved; will consider echocardiogram in future if needed  5. Obesity -Weight loss strongly encouraged through dietary modifications and daily physical activity  -Intermittent fasting and mediterranean diet information explained at today's visit  5. Preoperative cardiovascular examination  -Okay to proceed with surgery from a cardiac standpoint   No orders of the defined types were placed in this encounter.  Return if symptoms worsen or fail to improve.  I personally performed the service, non-incident to. (WP)  NICOLE Julian Hy, PA    Electronically signed by Zeb Comfort, PA at 04/14/2018 2:37 PM EDT  Plan of Treatment - documented as of this encounter  Not on file  Visit Diagnoses - documented in this encounter  Diagnosis  Essential hypertension - Primary   OSA on CPAP   Controlled type 2 diabetes mellitus without complication, without long-term current use of insulin (CMS-HCC)   Heart palpitations  Palpitations   Obesity, Class III, BMI 40-49.9 (morbid obesity) (CMS-HCC)   Preoperative cardiovascular examination  Pre-operative cardiovascular examination   Discontinued Medications - documented as of this encounter  Medication Sig Discontinue Reason Start Date End Date  lamoTRIgine (LAMICTAL) 200 MG tablet  Take 200 mg by mouth once daily.   04/14/2018  lamoTRIgine (LAMICTAL) 25 MG tablet  Take 50 mg by mouth once daily.  04/14/2018  levothyroxine (SYNTHROID, LEVOTHROID) 112 MCG tablet  Take 0.5 tablets (56 mcg total) by mouth 2 (two) times daily.  12/20/2014 04/14/2018  losartan (COZAAR) 50 MG tablet  TAKE 1 TABLET (50 MG TOTAL) BY MOUTH 2 (TWO) TIMES DAILY.  10/01/2015 04/14/2018  metFORMIN (GLUCOPHAGE) 500 MG tablet  Take 1 tablet (500 mg total) by mouth 2 (two) times daily.  09/08/2015 04/14/2018  Historical Medications - added in this  encounter  This list may reflect changes made after this encounter.  Medication Sig Dispensed Refills Start Date End Date  duke's magic mouthwash suspension  SWISH AND SPIT 3 TIMES DAILY  2 01/20/2018   metFORMIN (GLUCOPHAGE) 500 MG tablet  Take 1 tablet by mouth 3 (three) times daily  0 09/08/2015   losartan (COZAAR) 50 MG tablet  Take 1 tablet by mouth 2 (two) times daily  0 10/01/2015   levothyroxine (SYNTHROID, LEVOTHROID) 175 MCG tablet  Take 175 mcg by mouth once daily  0 09/05/2017   lamoTRIgine (LAMICTAL) 100 MG tablet  Take 3 tablets by mouth once daily  0 11/28/2015   JANUVIA 100 mg tablet  Take 1 tablet by mouth once daily  3 04/03/2018   ranitidine (ZANTAC) 150 MG tablet  Take 150 mg by mouth 2 (two) times daily  5 04/03/2018   omega-3 fatty acids-fish oil (FISH OIL) 360-1,200 mg Cap  Take 1 capsule by mouth once daily  0    multivitamin (MULTIVITAMIN) tablet  Take by mouth  0    ibuprofen (ADVIL,MOTRIN) 200 MG tablet  Take by mouth once daily as needed  0    diclofenac (VOLTAREN) 75 MG EC tablet  Take 75 mg by mouth 2 (two) times daily as needed  1 01/07/2018   cyclobenzaprine (FLEXERIL) 5 MG tablet  Take 5 mg by mouth once daily as needed  0 07/19/2017   cholecalciferol (VITAMIN D3) 5,000 unit capsule  Take 2 capsules by mouth once daily  0 11/28/2015   ascorbic acid, vitamin C, (VITAMIN C) 1000 MG tablet  Take 1,000 mg by mouth once daily  0    albuterol 90 mcg/actuation inhaler  Inhale into the lungs  0 05/11/2017   Images Patient Contacts   Contact Name Contact Address Communication Relationship to Patient  Etta Quill Unknown [phone] [facility]) Other, Emergency Contact  Document Information  Primary Care Provider Other Service Providers Document Coverage Dates  Eulah Pont, MD (Oct. 04, 2019October 04, 2019 - Present) DM: [phone] (Work) [phone] (Fax) [facility] [place], [place] [address] Internal Medicine  Oct. 04, 2019October 04, 2019   [place] [address] [place], [place] [address]   Encounter Providers Encounter Date  Zeb Comfort, [place] (Attending) [phone] (Work) [phone]) [place], [place] [address] Cardiovascular Disease Oct. 04, 2019October 04, 2019

## 2018-04-14 NOTE — Pre-Procedure Instructions (Signed)
JAMIE FROM DR [facility] CALLED AND SAID THAT DR BERGLUND COMPARED EKG TO PRIOR EKG AND THERE HAS BEEN CHANGES SO SHE IS NOT CLEARING PT AND THEY ARE SENDING A STAT REFERAL TO DR CALLWOOD FOR CARDIAC CLEARANCE. I CALLED AND INFORMED SAMANTHA AT DR JUENGEL'S OFFICE AND INFORMED AMY AT DR Audree Bane OFFICE OF THIS

## 2018-04-14 NOTE — Pre-Procedure Instructions (Signed)
CALLED AND SPOKE WITH TINA AT DR Delaine Lame OFFICE AND NOTIFIED HER OF CLEARANCE-ALSO CALLED AND LEFT MESSAGE FOR AMY AT DR Audree Bane OFFICE REGARDING MEDICAL CLEARANCE NEEDED-FAXED CLEARANCE PAPERWORK TO BOTH OFFICES AND TO DR [facility] OFFICE AS WELL

## 2018-04-16 MED ORDER — CEFAZOLIN SODIUM-DEXTROSE 2-4 GM/100ML-% IV SOLN
2.0000 g | INTRAVENOUS | Status: AC
  Administered 2018-04-17: 2 g via INTRAVENOUS

## 2018-04-16 MED ORDER — SODIUM CHLORIDE 0.9 % IR SOLN
2000.0000 mg | Freq: Once | Status: DC
  Filled 2018-04-16: qty 53

## 2018-04-17 ENCOUNTER — Encounter
Admission: AD | Disposition: A | Discharge status: Home / Self Care | Payer: Self-pay | Source: Ambulatory Visit | Attending: Otolaryngology

## 2018-04-17 ENCOUNTER — Encounter: Payer: Self-pay | Admitting: *Deleted

## 2018-04-17 ENCOUNTER — Observation Stay
Admission: AD | Admit: 2018-04-17 | Discharge: 2018-04-18 | Disposition: A | Discharge status: Home / Self Care | Payer: BLUE CROSS/BLUE SHIELD | Source: Ambulatory Visit | Attending: Otolaryngology | Admitting: Otolaryngology

## 2018-04-17 ENCOUNTER — Other Ambulatory Visit: Payer: Self-pay

## 2018-04-17 ENCOUNTER — Other Ambulatory Visit: Payer: Self-pay | Admitting: Internal Medicine

## 2018-04-17 ENCOUNTER — Ambulatory Visit: Payer: BLUE CROSS/BLUE SHIELD | Admitting: Anesthesiology

## 2018-04-17 DIAGNOSIS — J328 Other chronic sinusitis: Secondary | ICD-10-CM | POA: Diagnosis not present

## 2018-04-17 DIAGNOSIS — Z83511 Family history of glaucoma: Secondary | ICD-10-CM | POA: Diagnosis not present

## 2018-04-17 DIAGNOSIS — Z7951 Long term (current) use of inhaled steroids: Secondary | ICD-10-CM | POA: Diagnosis not present

## 2018-04-17 DIAGNOSIS — Z7984 Long term (current) use of oral hypoglycemic drugs: Secondary | ICD-10-CM | POA: Insufficient documentation

## 2018-04-17 DIAGNOSIS — K219 Gastro-esophageal reflux disease without esophagitis: Secondary | ICD-10-CM | POA: Insufficient documentation

## 2018-04-17 DIAGNOSIS — I10 Essential (primary) hypertension: Secondary | ICD-10-CM | POA: Diagnosis not present

## 2018-04-17 DIAGNOSIS — J343 Hypertrophy of nasal turbinates: Secondary | ICD-10-CM | POA: Insufficient documentation

## 2018-04-17 DIAGNOSIS — D494 Neoplasm of unspecified behavior of bladder: Secondary | ICD-10-CM

## 2018-04-17 DIAGNOSIS — Z91040 Latex allergy status: Secondary | ICD-10-CM | POA: Diagnosis not present

## 2018-04-17 DIAGNOSIS — Z90710 Acquired absence of both cervix and uterus: Secondary | ICD-10-CM | POA: Insufficient documentation

## 2018-04-17 DIAGNOSIS — N3941 Urge incontinence: Secondary | ICD-10-CM | POA: Diagnosis not present

## 2018-04-17 DIAGNOSIS — Z811 Family history of alcohol abuse and dependence: Secondary | ICD-10-CM | POA: Diagnosis not present

## 2018-04-17 DIAGNOSIS — Z888 Allergy status to other drugs, medicaments and biological substances status: Secondary | ICD-10-CM | POA: Insufficient documentation

## 2018-04-17 DIAGNOSIS — N329 Bladder disorder, unspecified: Secondary | ICD-10-CM | POA: Diagnosis present

## 2018-04-17 DIAGNOSIS — F319 Bipolar disorder, unspecified: Secondary | ICD-10-CM | POA: Insufficient documentation

## 2018-04-17 DIAGNOSIS — R0902 Hypoxemia: Secondary | ICD-10-CM

## 2018-04-17 DIAGNOSIS — Z87891 Personal history of nicotine dependence: Secondary | ICD-10-CM | POA: Diagnosis not present

## 2018-04-17 DIAGNOSIS — J342 Deviated nasal septum: Secondary | ICD-10-CM | POA: Diagnosis not present

## 2018-04-17 DIAGNOSIS — R05 Cough: Secondary | ICD-10-CM

## 2018-04-17 DIAGNOSIS — Z8249 Family history of ischemic heart disease and other diseases of the circulatory system: Secondary | ICD-10-CM | POA: Insufficient documentation

## 2018-04-17 DIAGNOSIS — Z8349 Family history of other endocrine, nutritional and metabolic diseases: Secondary | ICD-10-CM | POA: Diagnosis not present

## 2018-04-17 DIAGNOSIS — J321 Chronic frontal sinusitis: Secondary | ICD-10-CM | POA: Diagnosis not present

## 2018-04-17 DIAGNOSIS — R42 Dizziness and giddiness: Secondary | ICD-10-CM | POA: Insufficient documentation

## 2018-04-17 DIAGNOSIS — Z79899 Other long term (current) drug therapy: Secondary | ICD-10-CM | POA: Insufficient documentation

## 2018-04-17 DIAGNOSIS — Z818 Family history of other mental and behavioral disorders: Secondary | ICD-10-CM | POA: Diagnosis not present

## 2018-04-17 DIAGNOSIS — J329 Chronic sinusitis, unspecified: Secondary | ICD-10-CM | POA: Diagnosis not present

## 2018-04-17 DIAGNOSIS — Z833 Family history of diabetes mellitus: Secondary | ICD-10-CM | POA: Insufficient documentation

## 2018-04-17 DIAGNOSIS — Z823 Family history of stroke: Secondary | ICD-10-CM | POA: Insufficient documentation

## 2018-04-17 DIAGNOSIS — J322 Chronic ethmoidal sinusitis: Secondary | ICD-10-CM | POA: Insufficient documentation

## 2018-04-17 DIAGNOSIS — N3081 Other cystitis with hematuria: Principal | ICD-10-CM | POA: Insufficient documentation

## 2018-04-17 DIAGNOSIS — K648 Other hemorrhoids: Secondary | ICD-10-CM | POA: Insufficient documentation

## 2018-04-17 DIAGNOSIS — Z8261 Family history of arthritis: Secondary | ICD-10-CM | POA: Insufficient documentation

## 2018-04-17 DIAGNOSIS — J320 Chronic maxillary sinusitis: Secondary | ICD-10-CM | POA: Insufficient documentation

## 2018-04-17 DIAGNOSIS — E559 Vitamin D deficiency, unspecified: Secondary | ICD-10-CM | POA: Insufficient documentation

## 2018-04-17 DIAGNOSIS — R3129 Other microscopic hematuria: Secondary | ICD-10-CM | POA: Insufficient documentation

## 2018-04-17 DIAGNOSIS — E669 Obesity, unspecified: Secondary | ICD-10-CM | POA: Insufficient documentation

## 2018-04-17 DIAGNOSIS — J3489 Other specified disorders of nose and nasal sinuses: Secondary | ICD-10-CM | POA: Insufficient documentation

## 2018-04-17 DIAGNOSIS — Z87440 Personal history of urinary (tract) infections: Secondary | ICD-10-CM | POA: Insufficient documentation

## 2018-04-17 DIAGNOSIS — E282 Polycystic ovarian syndrome: Secondary | ICD-10-CM | POA: Insufficient documentation

## 2018-04-17 DIAGNOSIS — N3090 Cystitis, unspecified without hematuria: Secondary | ICD-10-CM | POA: Diagnosis not present

## 2018-04-17 DIAGNOSIS — G4733 Obstructive sleep apnea (adult) (pediatric): Secondary | ICD-10-CM | POA: Insufficient documentation

## 2018-04-17 DIAGNOSIS — C679 Malignant neoplasm of bladder, unspecified: Secondary | ICD-10-CM | POA: Diagnosis not present

## 2018-04-17 DIAGNOSIS — E119 Type 2 diabetes mellitus without complications: Secondary | ICD-10-CM | POA: Diagnosis not present

## 2018-04-17 DIAGNOSIS — E039 Hypothyroidism, unspecified: Secondary | ICD-10-CM | POA: Insufficient documentation

## 2018-04-17 DIAGNOSIS — Z9049 Acquired absence of other specified parts of digestive tract: Secondary | ICD-10-CM | POA: Insufficient documentation

## 2018-04-17 DIAGNOSIS — R011 Cardiac murmur, unspecified: Secondary | ICD-10-CM | POA: Insufficient documentation

## 2018-04-17 DIAGNOSIS — Z6841 Body Mass Index (BMI) 40.0 and over, adult: Secondary | ICD-10-CM | POA: Insufficient documentation

## 2018-04-17 DIAGNOSIS — R059 Cough, unspecified: Secondary | ICD-10-CM

## 2018-04-17 DIAGNOSIS — R002 Palpitations: Secondary | ICD-10-CM | POA: Insufficient documentation

## 2018-04-17 HISTORY — PX: ETHMOIDECTOMY: SHX5197

## 2018-04-17 HISTORY — PX: TRANSURETHRAL RESECTION OF BLADDER TUMOR: SHX2575

## 2018-04-17 HISTORY — DX: Hypoxemia: R09.02

## 2018-04-17 HISTORY — PX: NASAL SEPTOPLASTY W/ TURBINOPLASTY: SHX2070

## 2018-04-17 HISTORY — PX: MAXILLARY ANTROSTOMY: SHX2003

## 2018-04-17 HISTORY — PX: IMAGE GUIDED SINUS SURGERY: SHX6570

## 2018-04-17 LAB — HEMOGLOBIN A1C
HEMOGLOBIN A1C: 8.5 % — AB (ref 4.8–5.6)
Mean Plasma Glucose: 197.25 mg/dL

## 2018-04-17 LAB — GLUCOSE, CAPILLARY
GLUCOSE-CAPILLARY: 181 mg/dL — AB (ref 70–99)
Glucose-Capillary: 233 mg/dL — ABNORMAL HIGH (ref 70–99)

## 2018-04-17 SURGERY — SINUS SURGERY, WITH IMAGING GUIDANCE
Anesthesia: General

## 2018-04-17 MED ORDER — ACETAMINOPHEN 10 MG/ML IV SOLN
INTRAVENOUS | Status: AC
  Filled 2018-04-17: qty 100

## 2018-04-17 MED ORDER — LACTATED RINGERS IV SOLN
INTRAVENOUS | Status: DC | PRN
  Administered 2018-04-17: 08:00:00 via INTRAVENOUS

## 2018-04-17 MED ORDER — ONDANSETRON HCL 4 MG/2ML IJ SOLN
4.0000 mg | Freq: Four times a day (QID) | INTRAMUSCULAR | Status: DC | PRN
  Administered 2018-04-17: 4 mg via INTRAVENOUS
  Filled 2018-04-17: qty 2

## 2018-04-17 MED ORDER — LOSARTAN POTASSIUM 50 MG PO TABS
50.0000 mg | ORAL_TABLET | Freq: Two times a day (BID) | ORAL | Status: DC
  Administered 2018-04-17 – 2018-04-18 (×2): 50 mg via ORAL
  Filled 2018-04-17 (×2): qty 1

## 2018-04-17 MED ORDER — HYDROMORPHONE HCL 1 MG/ML IJ SOLN
INTRAMUSCULAR | Status: DC | PRN
  Administered 2018-04-17: .4 mg via INTRAVENOUS

## 2018-04-17 MED ORDER — LABETALOL HCL 5 MG/ML IV SOLN
INTRAVENOUS | Status: AC
  Administered 2018-04-17: 5 mg via INTRAVENOUS
  Filled 2018-04-17: qty 4

## 2018-04-17 MED ORDER — OXYCODONE HCL 5 MG/5ML PO SOLN: 5.0000 mg | Freq: Once | ORAL | Status: AC | PRN

## 2018-04-17 MED ORDER — SUCCINYLCHOLINE CHLORIDE 20 MG/ML IJ SOLN
INTRAMUSCULAR | Status: AC
  Filled 2018-04-17: qty 1

## 2018-04-17 MED ORDER — HYDROCODONE-ACETAMINOPHEN 5-325 MG PO TABS
1.0000 | ORAL_TABLET | ORAL | Status: DC | PRN
  Administered 2018-04-17 (×2): 1 via ORAL
  Filled 2018-04-17 (×2): qty 1

## 2018-04-17 MED ORDER — OXYMETAZOLINE HCL 0.05 % NA SOLN
NASAL | Status: AC
  Administered 2018-04-17: 2 via NASAL
  Filled 2018-04-17: qty 15

## 2018-04-17 MED ORDER — LIDOCAINE-EPINEPHRINE (PF) 1 %-1:200000 IJ SOLN
INTRAMUSCULAR | Status: AC
  Filled 2018-04-17: qty 30

## 2018-04-17 MED ORDER — ONDANSETRON HCL 4 MG/2ML IJ SOLN
INTRAMUSCULAR | Status: AC
  Filled 2018-04-17: qty 2

## 2018-04-17 MED ORDER — OXYCODONE HCL 5 MG PO TABS
5.0000 mg | ORAL_TABLET | Freq: Once | ORAL | Status: AC | PRN
  Administered 2018-04-17: 5 mg via ORAL

## 2018-04-17 MED ORDER — LOSARTAN POTASSIUM 50 MG PO TABS
50.0000 mg | ORAL_TABLET | Freq: Once | ORAL | Status: AC
  Administered 2018-04-17: 50 mg via ORAL
  Filled 2018-04-17: qty 1

## 2018-04-17 MED ORDER — LAMOTRIGINE 100 MG PO TABS
300.0000 mg | ORAL_TABLET | Freq: Every day | ORAL | Status: DC
  Administered 2018-04-17 – 2018-04-18 (×2): 300 mg via ORAL
  Filled 2018-04-17 (×2): qty 3

## 2018-04-17 MED ORDER — FENTANYL CITRATE (PF) 100 MCG/2ML IJ SOLN
25.0000 ug | INTRAMUSCULAR | Status: DC | PRN
  Administered 2018-04-17 (×4): 25 ug via INTRAVENOUS

## 2018-04-17 MED ORDER — LIDOCAINE-EPINEPHRINE (PF) 1 %-1:200000 IJ SOLN
INTRAMUSCULAR | Status: DC | PRN
  Administered 2018-04-17: 5 mL

## 2018-04-17 MED ORDER — FENTANYL CITRATE (PF) 100 MCG/2ML IJ SOLN
INTRAMUSCULAR | Status: DC | PRN
  Administered 2018-04-17 (×2): 50 ug via INTRAVENOUS

## 2018-04-17 MED ORDER — PHENYLEPHRINE HCL 10 % OP SOLN
OPHTHALMIC | Status: DC | PRN
  Administered 2018-04-17: 10.5 mL via NASAL

## 2018-04-17 MED ORDER — INSULIN ASPART 100 UNIT/ML ~~LOC~~ SOLN: 0.0000 [IU] | Freq: Every day | SUBCUTANEOUS | Status: DC

## 2018-04-17 MED ORDER — HYDRALAZINE HCL 20 MG/ML IJ SOLN
10.0000 mg | Freq: Four times a day (QID) | INTRAMUSCULAR | Status: DC | PRN
  Administered 2018-04-17: 10 mg via INTRAVENOUS
  Filled 2018-04-17: qty 1

## 2018-04-17 MED ORDER — SUGAMMADEX SODIUM 200 MG/2ML IV SOLN
INTRAVENOUS | Status: DC | PRN
  Administered 2018-04-17: 200 mg via INTRAVENOUS

## 2018-04-17 MED ORDER — MEPERIDINE HCL 50 MG/ML IJ SOLN: 6.2500 mg | INTRAMUSCULAR | Status: DC | PRN

## 2018-04-17 MED ORDER — LIDOCAINE HCL (PF) 2 % IJ SOLN
INTRAMUSCULAR | Status: AC
  Filled 2018-04-17: qty 10

## 2018-04-17 MED ORDER — DEXAMETHASONE SODIUM PHOSPHATE 10 MG/ML IJ SOLN
INTRAMUSCULAR | Status: DC | PRN
  Administered 2018-04-17: 10 mg via INTRAVENOUS

## 2018-04-17 MED ORDER — LIDOCAINE HCL (PF) 4 % IJ SOLN
INTRAMUSCULAR | Status: AC
  Filled 2018-04-17: qty 5

## 2018-04-17 MED ORDER — ACETAMINOPHEN 10 MG/ML IV SOLN
INTRAVENOUS | Status: DC | PRN
  Administered 2018-04-17: 1000 mg via INTRAVENOUS

## 2018-04-17 MED ORDER — PROPOFOL 10 MG/ML IV BOLUS
INTRAVENOUS | Status: AC
  Filled 2018-04-17: qty 20

## 2018-04-17 MED ORDER — PHENYLEPHRINE HCL 10 MG/ML IJ SOLN
INTRAMUSCULAR | Status: DC | PRN
  Administered 2018-04-17: 50 ug via INTRAVENOUS
  Administered 2018-04-17: 200 ug via INTRAVENOUS
  Administered 2018-04-17: 50 ug via INTRAVENOUS
  Administered 2018-04-17 (×2): 100 ug via INTRAVENOUS
  Administered 2018-04-17: 50 ug via INTRAVENOUS

## 2018-04-17 MED ORDER — MIDAZOLAM HCL 2 MG/2ML IJ SOLN
INTRAMUSCULAR | Status: DC | PRN
  Administered 2018-04-17: 2 mg via INTRAVENOUS

## 2018-04-17 MED ORDER — MIDAZOLAM HCL 2 MG/2ML IJ SOLN
INTRAMUSCULAR | Status: AC
  Filled 2018-04-17: qty 2

## 2018-04-17 MED ORDER — FENTANYL CITRATE (PF) 100 MCG/2ML IJ SOLN
INTRAMUSCULAR | Status: AC
  Filled 2018-04-17: qty 2

## 2018-04-17 MED ORDER — ONDANSETRON HCL 4 MG/2ML IJ SOLN
INTRAMUSCULAR | Status: DC | PRN
  Administered 2018-04-17: 4 mg via INTRAVENOUS

## 2018-04-17 MED ORDER — ROCURONIUM BROMIDE 100 MG/10ML IV SOLN
INTRAVENOUS | Status: DC | PRN
  Administered 2018-04-17 (×2): 10 mg via INTRAVENOUS
  Administered 2018-04-17: 15 mg via INTRAVENOUS
  Administered 2018-04-17: 10 mg via INTRAVENOUS
  Administered 2018-04-17: 35 mg via INTRAVENOUS

## 2018-04-17 MED ORDER — OXYMETAZOLINE HCL 0.05 % NA SOLN
2.0000 | Freq: Once | NASAL | Status: AC
  Administered 2018-04-17: 2 via NASAL

## 2018-04-17 MED ORDER — PROMETHAZINE HCL 25 MG/ML IJ SOLN: 6.2500 mg | INTRAMUSCULAR | Status: DC | PRN

## 2018-04-17 MED ORDER — SEVOFLURANE IN SOLN
RESPIRATORY_TRACT | Status: AC
  Filled 2018-04-17: qty 250

## 2018-04-17 MED ORDER — INSULIN ASPART 100 UNIT/ML ~~LOC~~ SOLN: 0.0000 [IU] | Freq: Three times a day (TID) | SUBCUTANEOUS | Status: DC

## 2018-04-17 MED ORDER — ACETAMINOPHEN 325 MG PO TABS
650.0000 mg | ORAL_TABLET | ORAL | Status: DC | PRN
  Administered 2018-04-17 – 2018-04-18 (×2): 650 mg via ORAL
  Filled 2018-04-17 (×2): qty 2

## 2018-04-17 MED ORDER — LIDOCAINE HCL (CARDIAC) PF 100 MG/5ML IV SOSY
PREFILLED_SYRINGE | INTRAVENOUS | Status: DC | PRN
  Administered 2018-04-17: 100 mg via INTRAVENOUS

## 2018-04-17 MED ORDER — PHENYLEPHRINE HCL 10 MG/ML IJ SOLN
INTRAMUSCULAR | Status: AC
  Filled 2018-04-17: qty 1

## 2018-04-17 MED ORDER — DEXMEDETOMIDINE HCL 200 MCG/2ML IV SOLN
INTRAVENOUS | Status: DC | PRN
  Administered 2018-04-17: 10 ug via INTRAVENOUS

## 2018-04-17 MED ORDER — SODIUM CHLORIDE 0.9 % IV SOLN
INTRAVENOUS | Status: DC
  Administered 2018-04-17: 07:00:00 via INTRAVENOUS

## 2018-04-17 MED ORDER — OXYCODONE HCL 5 MG PO TABS
ORAL_TABLET | ORAL | Status: AC
  Filled 2018-04-17: qty 1

## 2018-04-17 MED ORDER — LABETALOL HCL 5 MG/ML IV SOLN
5.0000 mg | Freq: Once | INTRAVENOUS | Status: AC
  Administered 2018-04-17: 5 mg via INTRAVENOUS

## 2018-04-17 MED ORDER — GEMCITABINE CHEMO FOR BLADDER INSTILLATION 2000 MG
INTRAVENOUS | Status: DC | PRN
  Administered 2018-04-17: 2000 mg via INTRAVESICAL

## 2018-04-17 MED ORDER — OXYBUTYNIN CHLORIDE 5 MG PO TABS: 5.0000 mg | ORAL_TABLET | Freq: Three times a day (TID) | ORAL | 0 refills | Status: DC | PRN

## 2018-04-17 MED ORDER — HYDROMORPHONE HCL 1 MG/ML IJ SOLN
INTRAMUSCULAR | Status: AC
  Filled 2018-04-17: qty 1

## 2018-04-17 MED ORDER — DEXMEDETOMIDINE HCL IN NACL 200 MCG/50ML IV SOLN
INTRAVENOUS | Status: AC
  Filled 2018-04-17: qty 50

## 2018-04-17 MED ORDER — METFORMIN HCL 500 MG PO TABS
500.0000 mg | ORAL_TABLET | Freq: Two times a day (BID) | ORAL | Status: DC
  Administered 2018-04-18: 500 mg via ORAL
  Filled 2018-04-17: qty 1

## 2018-04-17 MED ORDER — LINAGLIPTIN 5 MG PO TABS
5.0000 mg | ORAL_TABLET | Freq: Every day | ORAL | Status: DC
  Filled 2018-04-17 (×2): qty 1

## 2018-04-17 MED ORDER — PROPOFOL 10 MG/ML IV BOLUS
INTRAVENOUS | Status: DC | PRN
  Administered 2018-04-17: 200 mg via INTRAVENOUS

## 2018-04-17 MED ORDER — CEFAZOLIN SODIUM-DEXTROSE 2-4 GM/100ML-% IV SOLN
INTRAVENOUS | Status: AC
  Filled 2018-04-17: qty 100

## 2018-04-17 MED ORDER — SODIUM CHLORIDE 0.9 % IV SOLN
INTRAVENOUS | Status: DC
  Administered 2018-04-17 (×2): via INTRAVENOUS

## 2018-04-17 MED ORDER — FENTANYL CITRATE (PF) 100 MCG/2ML IJ SOLN
INTRAMUSCULAR | Status: AC
  Administered 2018-04-17: 25 ug via INTRAVENOUS
  Filled 2018-04-17: qty 2

## 2018-04-17 MED ORDER — LEVOTHYROXINE SODIUM 50 MCG PO TABS
175.0000 ug | ORAL_TABLET | Freq: Every day | ORAL | Status: DC
  Administered 2018-04-18: 175 ug via ORAL
  Filled 2018-04-17 (×2): qty 1

## 2018-04-17 SURGICAL SUPPLY — 58 items
BAG DRAIN CYSTO-URO LG1000N (MISCELLANEOUS) IMPLANT
BAG URINE DRAINAGE (UROLOGICAL SUPPLIES) ×3 IMPLANT
BATTERY INSTRU NAVIGATION (MISCELLANEOUS) ×6 IMPLANT
BLADE SURG 15 STRL LF DISP TIS (BLADE) ×2 IMPLANT
BLADE SURG 15 STRL SS (BLADE) ×1
BRUSH SCRUB EZ  4% CHG (MISCELLANEOUS) ×1
BRUSH SCRUB EZ 4% CHG (MISCELLANEOUS) ×2 IMPLANT
CANISTER SUCT 1200ML W/VALVE (MISCELLANEOUS) ×6 IMPLANT
CANISTER SUCT 3000ML PPV (MISCELLANEOUS) ×3 IMPLANT
CATH FOLEY 2WAY  5CC 16FR (CATHETERS) ×1
CATH URTH 16FR FL 2W BLN LF (CATHETERS) ×2 IMPLANT
COAG SUCT 10F 3.5MM HAND CTRL (MISCELLANEOUS) ×3 IMPLANT
CRADLE LAMINECT ARM (MISCELLANEOUS) IMPLANT
CUP MEDICINE 2OZ PLAST GRAD ST (MISCELLANEOUS) ×9 IMPLANT
DRAPE UTILITY 15X26 TOWEL STRL (DRAPES) ×3 IMPLANT
DRESSING NASL FOAM PST OP SINU (MISCELLANEOUS) IMPLANT
DRSG NASAL FOAM POST OP SINU (MISCELLANEOUS)
DRSG TELFA 4X3 1S NADH ST (GAUZE/BANDAGES/DRESSINGS) ×3 IMPLANT
ELECT LOOP 22F BIPOLAR SML (ELECTROSURGICAL)
ELECT REM PT RETURN 9FT ADLT (ELECTROSURGICAL) ×3
ELECTRODE LOOP 22F BIPOLAR SML (ELECTROSURGICAL) IMPLANT
ELECTRODE REM PT RTRN 9FT ADLT (ELECTROSURGICAL) ×2 IMPLANT
GLOVE BIO SURGEON STRL SZ 6.5 (GLOVE) ×6 IMPLANT
GLOVE PROTEXIS LATEX SZ 7.5 (GLOVE) ×6 IMPLANT
GOWN STRL REUS W/ TWL LRG LVL3 (GOWN DISPOSABLE) ×8 IMPLANT
GOWN STRL REUS W/TWL LRG LVL3 (GOWN DISPOSABLE) ×4
IV NS 500ML (IV SOLUTION) ×1
IV NS 500ML BAXH (IV SOLUTION) ×2 IMPLANT
KIT TURNOVER CYSTO (KITS) ×3 IMPLANT
LABEL OR SOLS (LABEL) ×3 IMPLANT
LOOP CUT BIPOLAR 24F LRG (ELECTROSURGICAL) IMPLANT
NDL SAFETY ECLIPSE 18X1.5 (NEEDLE) ×2 IMPLANT
NEEDLE ANESTHESIA  27G X 3.5 (NEEDLE) ×1
NEEDLE ANESTHESIA 27G X 3.5 (NEEDLE) ×2 IMPLANT
NEEDLE HYPO 18GX1.5 SHARP (NEEDLE) ×1
NS IRRIG 500ML POUR BTL (IV SOLUTION) ×3 IMPLANT
PACK CYSTO AR (MISCELLANEOUS) ×3 IMPLANT
PACK HEAD/NECK (MISCELLANEOUS) ×3 IMPLANT
PACKING NASAL EPIS 4X2.4 XEROG (MISCELLANEOUS) ×9 IMPLANT
PATTIES SURGICAL .5 X3 (DISPOSABLE) ×3 IMPLANT
SET IRRIG Y TYPE TUR BLADDER L (SET/KITS/TRAYS/PACK) ×3 IMPLANT
SHAVER DIEGO BLD STD TYPE A (BLADE) ×3 IMPLANT
SOL ANTI-FOG 6CC FOG-OUT (MISCELLANEOUS) ×2 IMPLANT
SOL FOG-OUT ANTI-FOG 6CC (MISCELLANEOUS) ×1
SPLINT NASAL REUTER .5MM BIVLV (MISCELLANEOUS) ×3 IMPLANT
SURGILUBE 2OZ TUBE FLIPTOP (MISCELLANEOUS) ×3 IMPLANT
SUT CHROMIC 3-0 (SUTURE) ×1
SUT CHROMIC 3-0 KS 27XMFL CR (SUTURE) ×2
SUT ETHILON 3-0 KS 30 BLK (SUTURE) ×3 IMPLANT
SUT PLAIN GUT 4-0 (SUTURE) ×3 IMPLANT
SUTURE CHRMC 3-0 KS 27XMFL CR (SUTURE) ×2 IMPLANT
SWAB CULTURE AMIES ANAERIB BLU (MISCELLANEOUS) IMPLANT
SYR 20CC LL (SYRINGE) ×3 IMPLANT
SYR 3ML LL SCALE MARK (SYRINGE) ×3 IMPLANT
SYRINGE IRR TOOMEY STRL 70CC (SYRINGE) ×3 IMPLANT
TRACKER CRANIALMASK (MASK) ×3 IMPLANT
TUBING DECLOG MULTIDEBRIDER (TUBING) ×3 IMPLANT
WATER STERILE IRR 1000ML POUR (IV SOLUTION) IMPLANT

## 2018-04-17 NOTE — Consult Note (Signed)
[place] [facility] [place] NAME: Lori Knight    MR#:  [id number]  DATE OF BIRTH:  1970/10/21  DATE OF ADMISSION:  04/17/2018  PRIMARY CARE PHYSICIAN: [name], MD   REQUESTING/REFERRING PHYSICIAN: Dr. [name]  CHIEF COMPLAINT:  No chief complaint on file. Medical Management  HISTORY OF PRESENT ILLNESS:  Lori Knight  is a 47 y.o. female with a known history of non-insulin-dependent diabetes mellitus, hypertension, papillary bladder tumor, recurrent sinus infections, hypothyroidism, polycystic ovarian disease, sleep apnea presents to hospital for an elective endoscopic sinus surgery and TURBT for her bladder tumor.  Postoperatively noted to have hypertension and hypoxia. Patient was noted to have microscopic hematuria and recurrent UTIs, urogram showed angiomyolipomas in both kidneys worse on the right side.  Cystoscopy revealed a small bladder tumor.  So patient had TURBT and intravesical gemcitabine done today along with the sinus surgery that has been planned for her recurrent sinus infections.  She is not on home oxygen.  Postoperatively noted to require 2 L oxygen via nasal cannula, currently using a tent mask due to her nasal surgery.  Denies any headache, nausea or vomiting.  No Foley catheter present. She has urge incontinence due to recent weight gain.  Blood pressure has been elevated with systolic in the 176H and diastolic in the 607P.  Medical consult requested for medical management.  PAST MEDICAL HISTORY:   Past Medical History:  Diagnosis Date  . Adult hypothyroidism 06/21/2012  . Bilateral polycystic ovarian syndrome 06/21/2012  . Biliary calculus with cholecystitis 08/14/2012  . Bipolar 1 disorder ([place])   . BP (high blood pressure) 02/14/2014  . Bronchitis   . Diabetes mellitus ([place]) 08/14/2012  . Diabetes mellitus without complication ([place])    pre-diabetic  . Dysrhythmia    wore heart monitor 2016.  Corrected by changing Levothyroxine dose.  Marland Kitchen Heart murmur    followed by PCP-AS A CHILD-ASYMPTOMATIC  . Hyperprolactinemia ([place]) 06/21/2012  . Hypertension   . Motion sickness    carnival rides  . PONV (postoperative nausea and vomiting)    WAKES UP CRYING  . Shortness of breath dyspnea    stairs. related to wt.  . Sleep apnea    has CPAP. has not used since 2011  . Vertigo    2x in last yr    PAST SURGICAL HISTOIRY:   Past Surgical History:  Procedure Laterality Date  . ABDOMINAL HYSTERECTOMY  2012   cervical dysplasia/ovaries remian  . CHOLECYSTECTOMY    . COLONOSCOPY WITH PROPOFOL N/A 06/19/2015   Procedure: COLONOSCOPY WITH PROPOFOL;  Surgeon: [name], MD;  Location: [facility];  Service: Endoscopy;  Laterality: N/A;  Diabetic - oral meds   . DENTAL SURGERY    . GALLBLADDER SURGERY      SOCIAL HISTORY:   Social History   Tobacco Use  . Smoking status: Former Smoker    Packs/day: 1.00    Years: 10.00    Pack years: 10.00    Types: Cigarettes    Last attempt to quit: 07/12/2002    Years since quitting: 15.7  . Smokeless tobacco: Never Used  . Tobacco comment: quit 2004  Substance Use Topics  . Alcohol use: Yes    Alcohol/week: 0.0 standard drinks    Comment: rarely    FAMILY HISTORY:   Family History  Problem Relation Age of Onset  . Diabetes Maternal Grandmother   . Hypertension Maternal Grandmother   . Hyperlipidemia Maternal  Grandmother   . Hypothyroidism Maternal Grandmother   . Diabetes Mother   . Hypertension Mother   . Hypothyroidism Mother   . Bipolar disorder Mother   . Breast cancer Neg Hx     DRUG ALLERGIES:   Allergies  Allergen Reactions  . Hctz [Hydrochlorothiazide] Cough  . Lisinopril Cough  . Latex Rash    Condoms only    REVIEW OF SYSTEMS:   Review of Systems  Constitutional: Negative for chills, fever, malaise/fatigue and weight loss.  HENT: Negative for ear discharge, ear pain, hearing loss and nosebleeds.    Eyes: Negative for blurred vision, double vision and photophobia.  Respiratory: Positive for shortness of breath. Negative for cough, hemoptysis and wheezing.   Cardiovascular: Negative for chest pain, palpitations, orthopnea and leg swelling.  Gastrointestinal: Positive for abdominal pain and nausea. Negative for constipation, diarrhea, heartburn, melena and vomiting.  Genitourinary: Negative for dysuria, frequency and urgency.  Musculoskeletal: Negative for myalgias and neck pain.  Skin: Negative for rash.  Neurological: Negative for dizziness, tingling, sensory change, speech change, focal weakness and headaches.  Endo/Heme/Allergies: Does not bruise/bleed easily.  Psychiatric/Behavioral: Negative for depression.    MEDICATIONS AT HOME:   Prior to Admission medications   Medication Sig Start Date End Date Taking? Authorizing Provider  Ascorbic Acid (VITAMIN C) 1000 MG tablet Take 1,000 mg by mouth daily.   Yes [provider]  Cholecalciferol 5000 units capsule Take 1 capsule (5,000 Units total) by mouth daily. 11/28/15  Yes Plonk, Gwyndolyn Saxon, MD  ibuprofen (ADVIL,MOTRIN) 200 MG tablet Take 400 mg by mouth every 8 (eight) hours as needed for moderate pain.   Yes [provider]  ibuprofen (ADVIL,MOTRIN) 800 MG tablet Take 1 tablet (800 mg total) by mouth every 8 (eight) hours as needed. 06/10/16  Yes Melynda Ripple, MD  JANUVIA 100 MG tablet TAKE 1 TABLET (100 MG TOTAL) BY MOUTH DAILY. 06/16/17  Yes [name], MD  lamoTRIgine (LAMICTAL) 100 MG tablet Take 300 mg by mouth every morning.  11/28/15  Yes [provider]  levothyroxine (SYNTHROID, LEVOTHROID) 175 MCG tablet TAKE 1 TABLET (175 MCG TOTAL) BY MOUTH DAILY. 09/05/17  Yes [name], MD  losartan (COZAAR) 50 MG tablet TAKE 1 TABLET (50 MG TOTAL) BY MOUTH 2 (TWO) TIMES DAILY. Patient taking differently: Take 50 mg by mouth 2 (two) times daily.  06/14/17  Yes [name], MD  metFORMIN  (GLUCOPHAGE) 500 MG tablet Take 1 tablet (500 mg total) by mouth 3 (three) times daily. 11/06/17  Yes [name], MD  Multiple Vitamin (MULTIVITAMIN) tablet Take 1 tablet by mouth daily.   Yes [provider]  Omega-3 Fatty Acids (FISH OIL) 1200 MG CAPS Take 1,200 mg by mouth daily.    Yes [provider]  ranitidine (ZANTAC) 150 MG tablet TAKE 1 TABLET BY MOUTH TWICE A DAY Patient taking differently: Take 150 mg by mouth 2 (two) times daily.  10/27/17  Yes [name], MD  triamcinolone (NASACORT ALLERGY 24HR) 55 MCG/ACT AERO nasal inhaler Place 2 sprays into the nose daily.   Yes [provider]  albuterol (PROVENTIL HFA;VENTOLIN HFA) 108 (90 Base) MCG/ACT inhaler Inhale 2 puffs into the lungs every 4 (four) hours as needed for wheezing or shortness of breath. Patient not taking: Reported on 04/17/2018 05/11/17   [name], MD  benzonatate (TESSALON) 200 MG capsule Take 1 capsule (200 mg total) by mouth 3 (three) times daily as needed for cough. Patient  not taking: Reported on 04/17/2018 03/30/18   Melynda Ripple, MD  cyclobenzaprine (FEXMID) 7.5 MG tablet Take 7.5 mg by mouth 2 (two) times daily as needed for muscle spasms.     [provider]  diclofenac (VOLTAREN) 75 MG EC tablet Take 75 mg by mouth 2 (two) times daily as needed for moderate pain.  08/23/17   [provider]  fluticasone (FLONASE) 50 MCG/ACT nasal spray PLACE 2 SPRAYS DAILY INTO BOTH NOSTRILS. 07/14/17   [name], MD  hydrochlorothiazide (HYDRODIURIL) 25 MG tablet TAKE 1 TABLET BY MOUTH EVERY DAY 11/01/17   [name], MD  metFORMIN (GLUCOPHAGE) 500 MG tablet TAKE 1 TABLET BY MOUTH THREE TIMES A DAY *NEED TO SCHEDULE FOLLOW UP IN NEXT 30 DAYS* 04/17/18   [name], MD  montelukast (SINGULAIR) 10 MG tablet TAKE 1 TABLET BY MOUTH EVERYDAY AT BEDTIME 04/17/18   [name], MD  oxybutynin (DITROPAN) 5 MG tablet Take 1 tablet (5 mg total) by  mouth every 8 (eight) hours as needed for bladder spasms. 04/17/18   [name], MD      VITAL SIGNS:  Blood pressure (!) 168/97, pulse 74, temperature 97.7 F (36.5 C), resp. rate (!) 92, height 5\' 7"  (1.702 m), weight (!) 136.3 kg, SpO2 93 %.  PHYSICAL EXAMINATION:   Physical Exam  GENERAL:  47 y.o.-year-old obese patient lying in the bed with no acute distress.  EYES: Pupils equal, round, reactive to light and accommodation. No scleral icterus. Extraocular muscles intact.  HEENT: Head atraumatic, normocephalic. Nares closed with dressing in place- tent mask 2L for o2 - Oropharynx dry and dry mucus membranes as well.  NECK:  Supple, no jugular venous distention. No thyroid enlargement, no tenderness.  LUNGS: Normal breath sounds bilaterally, no wheezing, rales,rhonchi or crepitation. No use of accessory muscles of respiration. decreased at the bases CARDIOVASCULAR: S1, S2 normal. No  rubs, or gallops. Soft 2/6 systolic murmur present ABDOMEN: Soft, nontender, nondistended. Bowel sounds present. No organomegaly or mass.  EXTREMITIES: No pedal edema, cyanosis, or clubbing.  NEUROLOGIC: Cranial nerves II through XII are intact. Muscle strength 5/5 in all extremities. Sensation intact. Gait not checked.  PSYCHIATRIC: The patient is alert and oriented x 3.  SKIN: No obvious rash, lesion, or ulcer.   LABORATORY PANEL:   CBC No results for input(s): WBC, HGB, HCT, PLT in the last 168 hours. ------------------------------------------------------------------------------------------------------------------  Chemistries  Recent Labs  Lab 04/13/18 0932  NA 139  K 4.2  CL 104  CO2 28  GLUCOSE 186*  BUN 17  CREATININE 0.78  CALCIUM 9.1   ------------------------------------------------------------------------------------------------------------------  Cardiac Enzymes No results for input(s): TROPONINI in the last 168  hours. ------------------------------------------------------------------------------------------------------------------  RADIOLOGY:  No results found.  EKG:   Orders placed or performed during the hospital encounter of 04/17/18  . EKG 12-Lead  . EKG 12-Lead    IMPRESSION AND PLAN:   Lori Knight  is a 47 y.o. female with a known history of non-insulin-dependent diabetes mellitus, hypertension, papillary bladder tumor, recurrent sinus infections, hypothyroidism, polycystic ovarian disease, sleep apnea presents to hospital for an elective endoscopic sinus surgery and TURBT for her bladder tumor.  Postoperatively noted to have hypertension and hypoxia.  Hypertension-use IV hydralazine as needed for elevated pressures. -Patient can be restarted back on losartan  2.  Diabetes mellitus-on metformin and Januvia -Januvia changed to linagliptin.  Added sliding scale  3.  Sinus surgery-management per ENT -Wean oxygen as tolerated  4.  Papillary  tumor of bladder-management per urology.  Status post TURBT and intravesical gemcitabine installation  5.  DVT prophylaxis-recommend to start Lovenox. -Encourage ambulation   All the records are reviewed and case discussed with Consulting provider. Management plans discussed with the patient, family and they are in agreement.  CODE STATUS: Full Code  TOTAL TIME TAKING CARE OF THIS PATIENT: 50 minutes.    Travanti Mcmanus M.D on 04/17/2018 at 2:55 PM  Between 7am to 6pm - Pager - [phone]  After 6pm go to [email or web address] - password [email or web address] [facility]  [facility]  Office  [phone]  CC: Primary care Physician: [name], MD

## 2018-04-17 NOTE — Anesthesia Procedure Notes (Signed)
Procedure Name: Intubation Date/Time: 04/17/2018 7:49 AM Performed by: Justus Memory, CRNA Pre-anesthesia Checklist: Patient identified, Patient being monitored, Timeout performed, Emergency Drugs available and Suction available Patient Re-evaluated:Patient Re-evaluated prior to induction Oxygen Delivery Method: Circle system utilized Preoxygenation: Pre-oxygenation with 100% oxygen Induction Type: IV induction Ventilation: Mask ventilation without difficulty Laryngoscope Size: 3, McGraph and Mac Grade View: Grade I Tube type: Oral Rae Tube size: 7.0 mm Number of attempts: 1 Airway Equipment and Method: Stylet (pt ramped with blankets for intubation) Placement Confirmation: ETT inserted through vocal cords under direct vision,  positive ETCO2 and breath sounds checked- equal and bilateral Secured at: 22 (secured to chin) cm Tube secured with: Tape Dental Injury: Teeth and Oropharynx as per pre-operative assessment  Difficulty Due To: Difficulty was anticipated and Difficult Airway- due to large tongue Future Recommendations: Recommend- induction with short-acting agent, and alternative techniques readily available

## 2018-04-17 NOTE — Progress Notes (Signed)
Per MD okay for RN to d/c insulin orders and place order for metformin twice a day.

## 2018-04-17 NOTE — Anesthesia Postprocedure Evaluation (Signed)
Anesthesia Post Note  Patient: KLARA STJAMES  Procedure(s) Performed: IMAGE GUIDED SINUS SURGERY (N/A ) NASAL SEPTOPLASTY WITH TURBINATE REDUCTION (Bilateral ) MAXILLARY ANTROSTOMY (Bilateral ) ETHMOIDECTOMY (Bilateral ) NTRANSURETHRAL RESECTION OF BLADDER TUMOR (TURBT) WITH GEMCITABINE (N/A )  Patient location during evaluation: PACU Anesthesia Type: General Level of consciousness: awake and alert and oriented Pain management: pain level controlled Vital Signs Assessment: post-procedure vital signs reviewed and stable Respiratory status: spontaneous breathing, nonlabored ventilation and respiratory function stable Cardiovascular status: blood pressure returned to baseline and stable Postop Assessment: no signs of nausea or vomiting Anesthetic complications: no   Pt admitted for overnight observation due to low O2 sats while asleep, pt has CPAP at home but will not be able to wear it after sinus surgery today.  Last Vitals:  Vitals:   04/17/18 1438 04/17/18 1441  BP: (!) 168/97 (!) 168/97  Pulse: 70 74  Resp:    Temp:    SpO2: 93% 93%    Last Pain:  Vitals:   04/17/18 1438  TempSrc:   PainSc: 6                  Bretton Tandy

## 2018-04-17 NOTE — Anesthesia Post-op Follow-up Note (Signed)
Anesthesia QCDR form completed.        

## 2018-04-17 NOTE — H&P (Signed)
H&P has been reviewedand patient reevaluated,  and no changes necessary. To be downloaded later.  

## 2018-04-17 NOTE — Op Note (Signed)
Date of procedure: 04/17/18  Preoperative diagnosis:  1. Bladder mass  Postoperative diagnosis:  1. Same as above  Procedure: 1. TURBT, small 2. Bladder biopsy 3. Instillation of intravesical gemcitabine  Surgeon: [name], MD  Anesthesia: General  Complications: None  Intraoperative findings: Small approximately 1 cm spherical papillary tumor on narrow stalk on anterior bladder wall, resected via cold cup biopsy.  Squamous metaplasia at the bladder neck also biopsied.  Hemostasis adequate.  EBL: Minimal  Specimens: Bladder tumor, trigone biopsy  Drains: 16 French Foley catheter  Indication: Lori Knight is a 47 y.o. patient with microscopic hematuria found to have a 1 cm tumor in the anterior bladder wall concerning for malignancy.  After reviewing the management options for treatment, she elected to proceed with the above surgical procedure(s). We have discussed the potential benefits and risks of the procedure, side effects of the proposed treatment, the likelihood of the patient achieving the goals of the procedure, and any potential problems that might occur during the procedure or recuperation. Informed consent has been obtained.  Description of procedure:  The patient was brought to the operating room at which time she underwent an ENT procedure with Dr. Kathyrn Sheriff.  This procedure was complete, she was repositioned in the dorsolithotomy position and reprepped and draped. A preoperative time-out was performed.   A 21 French scope was advanced per urethra into the bladder.  The bladder was inspected and only single isolated 1 cm tumor was identified in the anterior bladder wall which had a somewhat low-grade and noninvasive appearance.  It was spherical on a relatively narrow stalk.  The remainder of the bladder was unremarkable.  There is some trigonitis/squamous metaplasia at the bladder neck extending towards the trigone.  I resected the tumor using a single bite of  cold cup biopsy forceps to completely enucleate the tumor.  This is passed off the field as bladder tumor.  The bladder neck was also biopsied as a precaution although this appeared to be benign.  Bugbee electrocautery was then used to fulgurate each of the 2 biopsy/resection sites for adequate hemostasis.  The bladder was then drained.  A 16 French Foley catheter was placed in the balloon filled with 10 cc of sterile water.  The patient was a clean dry, repositioned in supine position, reversed anesthesia and taken to the PACU in stable condition.  Intravesical gemcitabine, 2000 g and 50 mL was instilled into the patient's bladder and allowed to dwell for 1 hour in the PACU.  This was well-tolerated.  After 1 hour the Foley was unclamped and drained and the Foley catheter was removed.  Plan: I will call the patient with her pathology results.  She will follow-up in 3 months with cystoscopy.  [name], M.D.

## 2018-04-17 NOTE — Transfer of Care (Signed)
Immediate Anesthesia Transfer of Care Note  Patient: Lori Knight  Procedure(s) Performed: IMAGE GUIDED SINUS SURGERY (N/A ) NASAL SEPTOPLASTY WITH TURBINATE REDUCTION (Bilateral ) MAXILLARY ANTROSTOMY (Bilateral ) ETHMOIDECTOMY (Bilateral ) NTRANSURETHRAL RESECTION OF BLADDER TUMOR (TURBT) WITH GEMCITABINE (N/A )  Patient Location: PACU  Anesthesia Type:General  Level of Consciousness: sedated  Airway & Oxygen Therapy: Patient Spontanous Breathing and Patient connected to face mask oxygen  Post-op Assessment: Report given to RN and Post -op Vital signs reviewed and stable  Post vital signs: Reviewed and stable  Last Vitals:  Vitals Value Taken Time  BP 169/115 04/17/2018 11:31 AM  Temp 36.5 C 04/17/2018 11:30 AM  Pulse 98 04/17/2018 11:37 AM  Resp 15 04/17/2018 11:37 AM  SpO2 98 % 04/17/2018 11:37 AM  Vitals shown include unvalidated device data.  Last Pain:  Vitals:   04/17/18 1130  TempSrc:   PainSc: Asleep         Complications: No apparent anesthesia complications

## 2018-04-17 NOTE — H&P (Signed)
UROLOGY H&P UPDATE  Agree with prior H&P dated 10/1 by Dr. Erlene Quan.  Cardiac: RRR Lungs: CTA bilaterally  Laterality: N/A Procedure: TURBT, instillation of gemcitabine  Urine: 10/1 cx no growth  Informed consent obtained, we specifically discussed the risk of bleeding, infection, bladder perforation, need for additional procedures, follow up pathways pending pathology results, need for long term surveillance.  Billey Co, MD 04/17/2018

## 2018-04-17 NOTE — Anesthesia Preprocedure Evaluation (Signed)
Anesthesia Evaluation  Patient identified by MRN, date of birth, ID band Patient awake    Reviewed: Allergy & Precautions, NPO status , Patient's Chart, lab work & pertinent test results  History of Anesthesia Complications (+) PONV and history of anesthetic complications  Airway Mallampati: III  TM Distance: >3 FB Neck ROM: Full    Dental  (+) Implants   Pulmonary sleep apnea (has CPAP but does not use) , former smoker,    breath sounds clear to auscultation- rhonchi (-) wheezing      Cardiovascular Exercise Tolerance: Good hypertension, Pt. on medications (-) CAD, (-) Past MI, (-) Cardiac Stents and (-) CABG  Rhythm:Regular Rate:Normal - Systolic murmurs and - Diastolic murmurs    Neuro/Psych PSYCHIATRIC DISORDERS Bipolar Disorder negative neurological ROS     GI/Hepatic Neg liver ROS, GERD  ,  Endo/Other  diabetes, Oral Hypoglycemic AgentsHypothyroidism   Renal/GU negative Renal ROS     Musculoskeletal negative musculoskeletal ROS (+)   Abdominal (+) + obese,   Peds  Hematology negative hematology ROS (+)   Anesthesia Other Findings Past Medical History: 06/21/2012: Adult hypothyroidism 06/21/2012: Bilateral polycystic ovarian syndrome 08/14/2012: Biliary calculus with cholecystitis No date: Bipolar 1 disorder ([place]) 02/14/2014: BP (high blood pressure) No date: Bronchitis 08/14/2012: Diabetes mellitus ([place]) No date: Diabetes mellitus without complication ([place])     Comment:  pre-diabetic No date: Dysrhythmia     Comment:  wore heart monitor 2016. Corrected by changing               Levothyroxine dose. No date: Heart murmur     Comment:  followed by PCP-AS A CHILD-ASYMPTOMATIC 06/21/2012: Hyperprolactinemia ([facility]) No date: Hypertension No date: Motion sickness     Comment:  carnival rides No date: PONV (postoperative nausea and vomiting)     Comment:  WAKES UP CRYING No date: Shortness of breath dyspnea  Comment:  stairs. related to wt. No date: Sleep apnea     Comment:  has CPAP. has not used since 2011 No date: Vertigo     Comment:  2x in last yr   Reproductive/Obstetrics                             Anesthesia Physical Anesthesia Plan  ASA: III  Anesthesia Plan: General   Post-op Pain Management:    Induction: Intravenous  PONV Risk Score and Plan: 3 and Ondansetron, Dexamethasone and Midazolam  Airway Management Planned: Oral ETT  Additional Equipment:   Intra-op Plan:   Post-operative Plan: Extubation in OR  Informed Consent: I have reviewed the patients History and Physical, chart, labs and discussed the procedure including the risks, benefits and alternatives for the proposed anesthesia with the patient or authorized representative who has indicated his/her understanding and acceptance.   Dental advisory given  Plan Discussed with: CRNA and Anesthesiologist  Anesthesia Plan Comments:         Anesthesia Quick Evaluation

## 2018-04-17 NOTE — Op Note (Signed)
04/17/2018  10:33 AM [id number]   Pre-Op Dx:  Deviated Nasal Septum, chronic bilateral maxillary sinusitis, chronic bilateral ethmoid sinusitis, chronic bilateral frontal sinusitis, hypertrophic Inferior Turbinates  Post-op Dx: Same  Proc: Bilateral endoscopic total ethmoidectomy with frontal sinusotomy, bilateral endoscopic maxillary antrostomy, nasal Septoplasty, Bilateral Partial Reduction Inferior Turbinates, use of image guided system  Surg:  Lori Knight  Anes:  GOT  EBL: 100 mL  Comp: None  Findings: Thickened and inflamed mucous membranes in the ethmoid and maxillary sinuses and at the opening the frontal sinus duct on both sides.  The septum was deviated to the left superiorly at the ethmoid plate from the cartilage.  There was thick mucus in the posterior ethmoid air cell on the right side.   Procedure: With the patient in a comfortable supine position,  general orotracheal anesthesia was induced without difficulty.     The patient received preoperative Afrin spray for topical decongestion and vasoconstriction.  Intravenous prophylactic antibiotics were administered.  The image guided system was brought in and the CT scan was downloaded from the disc.  The template was applied to the face and was registered to the system.  There is 0.6 mm of variance.  The suction instruments were then registered to the system and the showed good alignment.  At an appropriate level, the patient was placed in a semi-sitting position.  Nasal vibrissae were trimmed.   1% Xylocaine with 1:100,000 epinephrine, 10 cc's, was infiltrated into the anterior floor of the nose, into the nasal spine region, into the membranous columella, and finally into the submucoperichondrial plane of the septum on both sides.  Several minutes were allowed for this to take effect.  Cottoniod pledgetts soaked in Afrin and 4% Xylocaine were placed into both nasal cavities and left while the patient was prepped and draped in  the standard fashion.  The materials were removed from the nose and observed to be intact and correct in number.  The nose was inspected with a headlight and the 0 degrees scope with the findings as described above.  A left Killian incision was sharply executed and carried down to the quadrangular cartilage. The mucoperichondrium was elelvated along the quadrangular plate back to the bony-cartilaginous junction. The mucoperiostium was then elevated along the ethmoid plate and the vomer. The boney-catilaginous junction was then split with a freer elevator and the mucoperiosteum was elevated on the opposite side. The mucoperiosteum was then elevated along the maxillary crest as needed to expose the crooked bone of the crest.  Boney spurs of the vomer and maxillary crest were removed with Donavan Foil forceps.  The cartilaginous plate was trimmed along its posterior and inferior borders of about 2 mm of cartilage to free it up inferiorly. Some of the deviated ethmoid plate was then fractured and removed with Takahashi forceps to free up the posterior border of the quadrangular plate and allow it to swing back to the midline. The mucosal flaps were placed back into their anatomic position to allow visualization of the airways. The septum now sat in the midline with an improved airway.  A 3-0 Chromic suture on a Keith needle in used to anchor the inferior septum at the nasal spine with a through and through suture. The mucosal flaps are then sutured together using a through and through whip stitch of 4-0 Plain Gut with a mini-Keith needle.  This was used to close the Shepherd incision as well.   The inferior turbinates were then inspected. An incision was created  along the inferior aspect of the left inferior turbinate with removal of some of the inferior soft tissue and bone. Electrocautery was used to control bleeding in the area. The remaining turbinate was then outfractured to open up the airway further. There  was no significant bleeding noted. The right turbinate was then trimmed and outfractured in a similar fashion.  The 0 degrees scope was used to visualize the left nasal passageway and the middle turbinate was infractured.  The uncinate process was incised with side biters and removed with through biting forceps and the [facility] microdebrider.  The natural ostium was visible into the left maxillary sinus and this was then widened posteriorly and inferiorly to visualize the maxillary sinus more.  There is some thickened mucous membranes but no sign of any pus in the sinus.  Once this was completely widened the ethmoid bulla was opened in the middle and posterior ethmoid air cells were opened using the 0 degrees scope and the microdebrider.  The image guided system was used to evaluate the depth of dissection in all the air cells were opened down to the skull base.  The 30 degrees scope was then used to open up the middle ethmoid air cells and then the 70 degrees scope was used to find the opening to the frontal sinus duct.  This was then widened using the frontal sinus through biting instruments.  A wide opening was now visible into the frontal sinus and the ethmoids were all cleaned.  A cottonoid pledget was placed to her temporarily while the right side was then addressed.  The 0 degrees scope was used to visualize the right side and the middle turbinate was infractured.  The uncinate process was incised again and removed with through biting forceps and the microdebrider.  The natural ostium was again found and this was widened posteriorly and inferiorly to create good access to the maxillary sinus.  There is thickened mucous membranes around the opening but no purulence in the sinus.  The ethmoid bulla was then opened and the middle and posterior ethmoid air cells were widened using the microdebrider.  The 30 degrees scope was then used for visualizing the middle and anterior ethmoid air cells and these were  widened.  The large agar nasi cell was opened in the frontal sinus duct was found on its posterior wall.  The posterior wall of the agar nasi cell was then removed to open up widely into the frontal sinus duct.  This was widened using the frontal sinus instruments and the 70 degrees scope.  The image guided system was used for making sure this was all open.  The sinuses were all revisualize in the posterior ethmoid air cell was found completely filled with thick white mucus and was opened widely.  This completed opening all the ethmoid air cells and frontal sinus duct on the right side.  A cottonoid pledget was placed here temporarily.  The left side was revisited and small little bone shards were removed and smoothed out.  The ethmoid sinus was then filled with xerogel in the anterior ethmoids at the frontal sinus duct and then again in the posterior ethmoids.  More was then placed to fill up the ethmoids and keep the middle turbinate medialized.  The right side was then revisualize and again areas were cleaned and suction of all old clots.  Xerogel was placed in the anterior ethmoid, posterior ethmoid and then lateral to the middle turbinate holder medialized.  These were all wetted  and liquefied.  The airways were then visualized and showed open passageways on both sides that were significantly improved compared to before surgery. There was no signifcant bleeding. Nasal splints were applied to both sides of the septum using Xomed 0.72mm regular sized splints that were trimmed, and then held in position with a 3-0 Nylon through and through suture.  The patient was turned back over to anesthesia, and then had a urologic procedure performed in her bladder.  This was done by another physician and is dictated in detail elsewhere.  Dispo: The patient will be transferred to PACU to be discharged home  Plan: Ice, elevation, narcotic analgesia, steroid taper, and prophylactic antibiotics for the duration of  indwelling nasal foreign bodies.  We will reevaluate the patient in the office in 6 days and remove the septal splints.  Return to work in 10 days, strenuous activities in two weeks.   Lori Knight 04/17/2018 10:33 AM

## 2018-04-17 NOTE — Discharge Instructions (Addendum)
Transurethral Resection of Bladder Tumor (TURBT) or Bladder Biopsy ° ° °Definition: ° Transurethral Resection of the Bladder Tumor is a surgical procedure used to diagnose and remove tumors within the bladder. TURBT is the most common treatment for early stage bladder cancer. ° °General instructions: °   ° Your recent bladder surgery requires very little post hospital care but some definite precautions. ° °Despite the fact that no skin incisions were used, the area around the bladder incisions are raw and covered with scabs to promote healing and prevent bleeding. Certain precautions are needed to insure that the scabs are not disturbed over the next 2-4 weeks while the healing proceeds. ° °Because the raw surface inside your bladder and the irritating effects of urine you may expect frequency of urination and/or urgency (a stronger desire to urinate) and perhaps even getting up at night more often. This will usually resolve or improve slowly over the healing period. You may see some blood in your urine over the first 6 weeks. Do not be alarmed, even if the urine was clear for a while. Get off your feet and drink lots of fluids until clearing occurs. If you start to pass clots or don't improve call us. ° °Diet: ° °You may return to your normal diet immediately. Because of the raw surface of your bladder, alcohol, spicy foods, foods high in acid and drinks with caffeine may cause irritation or frequency and should be used in moderation. To keep your urine flowing freely and avoid constipation, drink plenty of fluids during the day (8-10 glasses). Tip: Avoid cranberry juice because it is very acidic. ° °Activity: ° °Your physical activity doesn't need to be restricted. However, if you are very active, you may see some blood in the urine. We suggest that you reduce your activity under the circumstances until the bleeding has stopped. ° °Bowels: ° °It is important to keep your bowels regular during the postoperative  period. Straining with bowel movements can cause bleeding. A bowel movement every other day is reasonable. Use a mild laxative if needed, such as milk of magnesia 2-3 tablespoons, or 2 Dulcolax tablets. Call if you continue to have problems. If you had been taking narcotics for pain, before, during or after your surgery, you may be constipated. Take a laxative if necessary. ° ° ° °Medication: ° °You should resume your pre-surgery medications unless told not to. In addition you may be given an antibiotic to prevent or treat infection. Antibiotics are not always necessary. All medication should be taken as prescribed until the bottles are finished unless you are having an unusual reaction to one of the drugs. ° ° °[facility] °[place], [place] [address] °[phone] ° ° ° ° °

## 2018-04-17 NOTE — Progress Notes (Signed)
Per MD okay for RN to place order for zofran.

## 2018-04-17 NOTE — Progress Notes (Signed)
04/17/2018 6:30 pm  S: Not feeling very well. Nauseated from bladder catheter and doesn't feel like she is urinating well. Is stuffy in her nose.   O: Oxygenating well with face bucket at 98%. BP is down to normal now. Minimal ooze from nose, but mostly congested. Bladder catheter in place.   A: Stable postop. Oxygen better since fully awake. Has OSA and now nasal congestion post nasal and septal surgery.   P: Will monitor overnight and if improved in the morning can go home then. Will give zofran for nausea. Will use ibuprofen for pain, to try to limit narcotic use.Will sleep upright when goes home. Get TED hose.

## 2018-04-17 NOTE — Progress Notes (Signed)
Pt BP still elevated and O2 sats still low. Dr. Randa Lynn ordered Losartan for BP and stated to continue to monitor O2 sats. Lori Knight E 12:48 PM 04/17/2018

## 2018-04-17 NOTE — Progress Notes (Signed)
IS education attempted, pt new from PACU. Will try again later

## 2018-04-18 DIAGNOSIS — Z888 Allergy status to other drugs, medicaments and biological substances status: Secondary | ICD-10-CM | POA: Diagnosis not present

## 2018-04-18 DIAGNOSIS — J322 Chronic ethmoidal sinusitis: Secondary | ICD-10-CM | POA: Diagnosis not present

## 2018-04-18 DIAGNOSIS — E119 Type 2 diabetes mellitus without complications: Secondary | ICD-10-CM | POA: Diagnosis not present

## 2018-04-18 DIAGNOSIS — N3941 Urge incontinence: Secondary | ICD-10-CM | POA: Diagnosis not present

## 2018-04-18 DIAGNOSIS — Z79899 Other long term (current) drug therapy: Secondary | ICD-10-CM | POA: Diagnosis not present

## 2018-04-18 DIAGNOSIS — Z8261 Family history of arthritis: Secondary | ICD-10-CM | POA: Diagnosis not present

## 2018-04-18 DIAGNOSIS — J343 Hypertrophy of nasal turbinates: Secondary | ICD-10-CM | POA: Diagnosis not present

## 2018-04-18 DIAGNOSIS — J342 Deviated nasal septum: Secondary | ICD-10-CM | POA: Diagnosis not present

## 2018-04-18 DIAGNOSIS — Z7951 Long term (current) use of inhaled steroids: Secondary | ICD-10-CM | POA: Diagnosis not present

## 2018-04-18 DIAGNOSIS — J321 Chronic frontal sinusitis: Secondary | ICD-10-CM | POA: Diagnosis not present

## 2018-04-18 DIAGNOSIS — J329 Chronic sinusitis, unspecified: Secondary | ICD-10-CM | POA: Diagnosis not present

## 2018-04-18 DIAGNOSIS — I10 Essential (primary) hypertension: Secondary | ICD-10-CM | POA: Diagnosis not present

## 2018-04-18 DIAGNOSIS — Z91040 Latex allergy status: Secondary | ICD-10-CM | POA: Diagnosis not present

## 2018-04-18 DIAGNOSIS — J3489 Other specified disorders of nose and nasal sinuses: Secondary | ICD-10-CM | POA: Diagnosis not present

## 2018-04-18 DIAGNOSIS — Z87891 Personal history of nicotine dependence: Secondary | ICD-10-CM | POA: Diagnosis not present

## 2018-04-18 DIAGNOSIS — N3081 Other cystitis with hematuria: Secondary | ICD-10-CM | POA: Diagnosis not present

## 2018-04-18 DIAGNOSIS — J320 Chronic maxillary sinusitis: Secondary | ICD-10-CM | POA: Diagnosis not present

## 2018-04-18 DIAGNOSIS — Z811 Family history of alcohol abuse and dependence: Secondary | ICD-10-CM | POA: Diagnosis not present

## 2018-04-18 DIAGNOSIS — C679 Malignant neoplasm of bladder, unspecified: Secondary | ICD-10-CM | POA: Diagnosis not present

## 2018-04-18 LAB — GLUCOSE, CAPILLARY
GLUCOSE-CAPILLARY: 158 mg/dL — AB (ref 70–99)
Glucose-Capillary: 184 mg/dL — ABNORMAL HIGH (ref 70–99)

## 2018-04-18 MED ORDER — ALUM & MAG HYDROXIDE-SIMETH 200-200-20 MG/5ML PO SUSP
15.0000 mL | Freq: Four times a day (QID) | ORAL | Status: DC | PRN
  Administered 2018-04-18: 15 mL via ORAL
  Filled 2018-04-18: qty 30

## 2018-04-18 NOTE — Discharge Summary (Signed)
Physician Discharge Summary  Patient ID: Lori Knight MRN: [id number] DOB/AGE: January 18, 1971 47 y.o.  Admit date: 04/17/2018 Discharge date: 04/18/2018  Admission Diagnoses: Deviated septum, chronic sinusitis, turbinate hypertrophy, bladder tumor, postop hypoxemia  Discharge Diagnoses: Deviated septum, chronic sinusitis, turbinate hypertrophy, bladder tumor, postop hypoxemia now improved Active Problems:   Hypoxia   Discharged Condition: good  Hospital Course: The patient was admitted to observation and watched overnight.  Once anesthesia had worn off and she was no longer using any narcotics then her breathing improved and her oxygenation improved as well.  She is currently satting at 98% on air.  She is discharged home to rest at home.  She will use Tylenol for pain help prevent any sedation.  She is not using her CPAP currently and will be sleeping upright in a chair until seen on Monday when we will remove splints from the nose and open up her airway further.  She had a bladder tumor removed by Dr. Erlene Quan, who will see her postoperatively to manage this.   Consults: Hospitalist to manage medical issues  Significant Diagnostic Studies: Oxygenation had dropped down to 75% in the recovery room, so she was remained on the face bucket until she was more awake.  And now at 98% on room air  Treatments: surgery: Septoplasty, turbinate reduction, bilateral endoscopic ethmoidectomies, frontal sinusotomies, and maxillary antrostomies.  She had removal of a bladder tumor by Dr. Erlene Quan.  Discharge Exam: Blood pressure 107/90, pulse 94, temperature 98.3 F (36.8 C), temperature source Oral, resp. rate 20, height 5\' 7"  (1.702 m), weight (!) 136.3 kg, SpO2 98 %. Her nose has plastic splints in place and small blood that she will begin saline flushes. no active bleeding.  Disposition: Discharge disposition: 01-Home or Self Care       Discharge Instructions    Call MD for:  hives   Complete  by:  As directed    Call MD for:  persistant nausea and vomiting   Complete by:  As directed    Call MD for:  temperature >100.4   Complete by:  As directed    Diet general   Complete by:  As directed    Diet general   Complete by:  As directed    Discharge patient   Complete by:  As directed    Discharge disposition:  01-Home or Self Care   Discharge patient date:  04/17/2018   Foley catheter - discontinue   Complete by:  As directed    During chemo after 1 hour and then remove Foley, 12:11   Increase activity slowly   Complete by:  As directed    Increase activity slowly   Complete by:  As directed       Follow-up Information    [name], MD In 3 months.   Specialty:  Urology Why:  for cystoscopy Contact information: [place] [address] [phone]           Signed: Huey Romans 04/18/2018, 7:28 AM

## 2018-04-18 NOTE — Progress Notes (Signed)
Patient discharge teaching given, including activity, diet, follow-up appoints, and medications. Patient verbalized understanding of all discharge instructions. IV access was d/c'd. Vitals are stable. Skin is intact except as charted in most recent assessments. Pt to be escorted out by NT, to be driven home by family.  Lori Knight  

## 2018-04-18 NOTE — Progress Notes (Signed)
[place] [facility] [place] NAME: Lori Knight    MR#:  [id number]  DATE OF BIRTH:  [birth year]  SUBJECTIVE:  CHIEF COMPLAINT: Patient is resting comfortably, no cystoscopy but able to breathe okay okay to go home from ENT standpoint  REVIEW OF SYSTEMS:  CONSTITUTIONAL: No fever, fatigue or weakness.  EYES: No blurred or double vision.  EARS, NOSE, AND THROAT: No tinnitus or ear pain.  RESPIRATORY: No cough, shortness of breath, wheezing or hemoptysis.  CARDIOVASCULAR: No chest pain, orthopnea, edema.  GASTROINTESTINAL: No nausea, vomiting, diarrhea or abdominal pain.  GENITOURINARY: No dysuria, hematuria.  ENDOCRINE: No polyuria, nocturia,  HEMATOLOGY: No anemia, easy bruising or bleeding SKIN: No rash or lesion. MUSCULOSKELETAL: No joint pain or arthritis.   NEUROLOGIC: No tingling, numbness, weakness.  PSYCHIATRY: No anxiety or depression.   DRUG ALLERGIES:   Allergies  Allergen Reactions  . Hctz [Hydrochlorothiazide] Cough  . Lisinopril Cough  . Latex Rash    Condoms only    VITALS:  Blood pressure (!) 143/74, pulse 82, temperature 98.2 F (36.8 C), temperature source Axillary, resp. rate 20, height 5\' 7"  (1.702 m), weight (!) 136.3 kg, SpO2 100 %.  PHYSICAL EXAMINATION:  GENERAL:  47 y.o.-year-old patient lying in the bed with no acute distress.  Morbidly obese EYES: Pupils equal, round, reactive to light and accommodation. No scleral icterus. Extraocular muscles intact.  HEENT: Head atraumatic, normocephalic. Oropharynx clear and nasopharynx with some crusted blood NECK:  Supple, no jugular venous distention. No thyroid enlargement, no tenderness.  LUNGS: Normal breath sounds bilaterally, no wheezing, rales,rhonchi or crepitation. No use of accessory muscles of respiration.  CARDIOVASCULAR: S1, S2 normal. No murmurs, rubs, or gallops.  ABDOMEN: Soft, nontender, nondistended. Bowel sounds present. No organomegaly or mass.   EXTREMITIES: No pedal edema, cyanosis, or clubbing.  NEUROLOGIC: Cranial nerves II through XII are intact. Muscle strength 5/5 in all extremities. Sensation intact. Gait not checked.  PSYCHIATRIC: The patient is alert and oriented x 3.  SKIN: No obvious rash, lesion, or ulcer.    LABORATORY PANEL:   CBC No results for input(s): WBC, HGB, HCT, PLT in the last 168 hours. ------------------------------------------------------------------------------------------------------------------  Chemistries  Recent Labs  Lab 04/13/18 0932  NA 139  K 4.2  CL 104  CO2 28  GLUCOSE 186*  BUN 17  CREATININE 0.78  CALCIUM 9.1   ------------------------------------------------------------------------------------------------------------------  Cardiac Enzymes No results for input(s): TROPONINI in the last 168 hours. ------------------------------------------------------------------------------------------------------------------  RADIOLOGY:  No results found.  EKG:   Orders placed or performed during the hospital encounter of 04/13/18  . EKG 12-Lead  . EKG 12-Lead    ASSESSMENT AND PLAN:   Satoria Dunlop  is a 47 y.o. female with a known history of non-insulin-dependent diabetes mellitus, hypertension, papillary bladder tumor, recurrent sinus infections, hypothyroidism, polycystic ovarian disease, sleep apnea presents to hospital for an elective endoscopic sinus surgery and TURBT for her bladder tumor.  Postoperatively noted to have hypertension and hypoxia.  Hypertension-use IV hydralazine as needed for elevated pressures. -Patient can be restarted back on losartan  2.  Diabetes mellitus-on metformin and Januvia -Januvia changed to linagliptin.  Added sliding scale during the hospital course.  Resume Januvia at discharge  3.  Sinus surgery-management per ENT -Oxygen weaned off -Nasal flushes as recommended by ENT  4.  Papillary tumor of bladder-management per urology.   Status post TURBT and intravesical gemcitabine installation  5.  DVT prophylaxis-recommend to start Lovenox. -Encourage ambulation  All the records are reviewed and case discussed with Care Management/Social Workerr. Management plans discussed with the patient, family and they are in agreement.  CODE STATUS: fc   TOTAL TIME TAKING CARE OF THIS PATIENT: 33  minutes.    Note: This dictation was prepared with Dragon dictation along with smaller phrase technology. Any transcriptional errors that result from this process are unintentional.   Nicholes Mango M.D on 04/18/2018 at 2:01 PM  Between 7am to 6pm - Pager - [phone] After 6pm go to [email or web address] - password [email or web address] [facility]  [facility]  Office  [phone]  CC: Primary care physician; [name], MD

## 2018-04-19 ENCOUNTER — Encounter: Payer: Self-pay | Admitting: Urology

## 2018-04-19 LAB — SURGICAL PATHOLOGY

## 2018-04-21 ENCOUNTER — Encounter: Payer: Self-pay | Admitting: Urology

## 2018-04-21 ENCOUNTER — Ambulatory Visit: Payer: BLUE CROSS/BLUE SHIELD | Admitting: Urology

## 2018-04-21 DIAGNOSIS — J3489 Other specified disorders of nose and nasal sinuses: Secondary | ICD-10-CM | POA: Diagnosis not present

## 2018-04-24 ENCOUNTER — Encounter: Payer: Self-pay | Admitting: Radiology

## 2018-04-24 DIAGNOSIS — Z48813 Encounter for surgical aftercare following surgery on the respiratory system: Secondary | ICD-10-CM | POA: Diagnosis not present

## 2018-04-25 ENCOUNTER — Encounter: Payer: Self-pay | Admitting: Internal Medicine

## 2018-04-25 ENCOUNTER — Other Ambulatory Visit: Payer: Self-pay | Admitting: Internal Medicine

## 2018-04-25 DIAGNOSIS — E119 Type 2 diabetes mellitus without complications: Secondary | ICD-10-CM

## 2018-04-26 DIAGNOSIS — F3181 Bipolar II disorder: Secondary | ICD-10-CM | POA: Diagnosis not present

## 2018-04-27 ENCOUNTER — Ambulatory Visit
Admission: RE | Admit: 2018-04-27 | Discharge: 2018-04-27 | Disposition: A | Discharge status: Home / Self Care | Payer: BLUE CROSS/BLUE SHIELD | Source: Ambulatory Visit | Attending: Internal Medicine | Admitting: Internal Medicine

## 2018-04-27 DIAGNOSIS — Z1231 Encounter for screening mammogram for malignant neoplasm of breast: Secondary | ICD-10-CM | POA: Diagnosis not present

## 2018-05-01 ENCOUNTER — Ambulatory Visit: Payer: BLUE CROSS/BLUE SHIELD | Admitting: Internal Medicine

## 2018-05-01 ENCOUNTER — Encounter: Payer: Self-pay | Admitting: Internal Medicine

## 2018-05-01 VITALS — BP 124/68 | HR 85 | Ht 67.0 in | Wt 305.0 lb

## 2018-05-01 DIAGNOSIS — E119 Type 2 diabetes mellitus without complications: Secondary | ICD-10-CM

## 2018-05-01 DIAGNOSIS — I10 Essential (primary) hypertension: Secondary | ICD-10-CM | POA: Diagnosis not present

## 2018-05-01 DIAGNOSIS — F319 Bipolar disorder, unspecified: Secondary | ICD-10-CM | POA: Diagnosis not present

## 2018-05-01 DIAGNOSIS — M545 Low back pain, unspecified: Secondary | ICD-10-CM

## 2018-05-01 DIAGNOSIS — F603 Borderline personality disorder: Secondary | ICD-10-CM | POA: Diagnosis not present

## 2018-05-01 MED ORDER — LOSARTAN POTASSIUM 100 MG PO TABS: 50.0000 mg | ORAL_TABLET | Freq: Two times a day (BID) | ORAL | 3 refills | Status: DC

## 2018-05-01 NOTE — Progress Notes (Signed)
Date:  05/01/2018   Name:  Lori Knight   DOB:  1971/01/03   MRN:  [id number]   Chief Complaint: Diabetes (Follow up.); Back Pain; and tongue pain (On and off pains for the last few months of feeling agitated and burning. No sharp pains. )  Diabetes  She presents for her follow-up diabetic visit. She has type 2 diabetes mellitus. Her disease course has been worsening. Pertinent negatives for hypoglycemia include no dizziness or headaches. Pertinent negatives for diabetes include no chest pain, no fatigue and no weakness. Current diabetic treatment includes oral agent (dual therapy). She is compliant with treatment most of the time. Her weight is increasing steadily. She monitors blood glucose at home 1-2 x per week. An ACE inhibitor/angiotensin II receptor blocker is being taken.  Back Pain  This is a chronic problem. The current episode started more than 1 year ago (seen at [facility] last year after a fall with work). The problem is unchanged. The pain is present in the lumbar spine. The quality of the pain is described as aching. The pain does not radiate. The pain is mild. Pertinent negatives include no chest pain, fever, headaches, numbness or weakness. She has tried muscle relaxant and NSAIDs for the symptoms. The treatment provided mild relief.  Hypertension  This is a chronic problem. The problem is unchanged. The problem is controlled. Pertinent negatives include no chest pain, headaches, palpitations or shortness of breath.   Lab Results  Component Value Date   HGBA1C 8.5 (H) 04/17/2018   05/2017 @ [facility]: FINDINGS:       On the AP view there is normal disc space.  No evidence of fracture noted on the AP view. Pedicles are intact. There is no evidence of paravertebral soft tissue abnormality.  Review of Systems  Constitutional: Negative for chills, fatigue and fever.  HENT: Positive for mouth sores (tongue sore).   Respiratory: Negative for chest tightness, shortness of  breath and wheezing.   Cardiovascular: Negative for chest pain and palpitations.  Musculoskeletal: Positive for back pain and myalgias. Negative for gait problem and joint swelling.  Skin: Negative for color change and rash.  Neurological: Negative for dizziness, weakness, numbness and headaches.  Psychiatric/Behavioral: Negative for dysphoric mood and sleep disturbance.    Patient Active Problem List   Diagnosis Date Noted  . Hypoxia 04/17/2018  . Plantar fasciitis 12/26/2017  . Cough in adult patient 12/26/2017  . Spasm of muscle of lower back 05/11/2017  . Fibroma of foot 03/03/2016  . Neuropathic pain of finger 03/03/2016  . Diabetes mellitus type 2, controlled, without complications ([place]) 43/15/4008  . OSA on CPAP 09/22/2015  . Intermittent palpitations 08/28/2015  . GERD (gastroesophageal reflux disease) 08/28/2015  . Insomnia 08/28/2015  . Internal hemorrhoids 08/28/2015  . Vitamin D deficiency 08/28/2015  . Obesity, Class III, BMI 40-49.9 (morbid obesity) ([place]) 08/27/2015  . Hypertension 02/14/2014  . Benign paroxysmal positional nystagmus 02/14/2014  . 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 Bipolar 1 disorder ([place]) 08/14/2012  . Adult hypothyroidism 06/21/2012  . Bilateral polycystic ovarian syndrome 06/21/2012    Allergies  Allergen Reactions  . Hctz [Hydrochlorothiazide] Cough  . Lisinopril Cough  . Latex Rash    Condoms only    Past Surgical History:  Procedure Laterality Date  . ABDOMINAL HYSTERECTOMY  2012   cervical dysplasia/ovaries remian  . CHOLECYSTECTOMY    . COLONOSCOPY WITH PROPOFOL N/A 06/19/2015   Procedure: COLONOSCOPY WITH PROPOFOL;  Surgeon: [name],  MD;  [facility] [facility];  Service: Endoscopy;  Laterality: N/A;  Diabetic - oral meds   . DENTAL SURGERY    . ETHMOIDECTOMY Bilateral 04/17/2018   Procedure: ETHMOIDECTOMY;  Surgeon: [name], MD;  Location: [facility];  Service: ENT;  Laterality: Bilateral;  . GALLBLADDER  SURGERY    . IMAGE GUIDED SINUS SURGERY N/A 04/17/2018   Procedure: IMAGE GUIDED SINUS SURGERY;  Surgeon: [name], MD;  Location: [facility];  Service: ENT;  Laterality: N/A;  . MAXILLARY ANTROSTOMY Bilateral 04/17/2018   Procedure: MAXILLARY ANTROSTOMY;  Surgeon: [name], MD;  Location: [facility];  Service: ENT;  Laterality: Bilateral;  . NASAL SEPTOPLASTY W/ TURBINOPLASTY Bilateral 04/17/2018   Procedure: NASAL SEPTOPLASTY WITH TURBINATE REDUCTION;  Surgeon: [name], MD;  Location: [facility];  Service: ENT;  Laterality: Bilateral;  . TRANSURETHRAL RESECTION OF BLADDER TUMOR N/A 04/17/2018   Procedure: NTRANSURETHRAL RESECTION OF BLADDER TUMOR (TURBT) WITH GEMCITABINE;  Surgeon: [name], MD;  Location: [facility];  Service: Urology;  Laterality: N/A;    Social History   Tobacco Use  . Smoking status: Former Smoker    Packs/day: 1.00    Years: 10.00    Pack years: 10.00    Types: Cigarettes    Last attempt to quit: 07/12/2002    Years since quitting: 15.8  . Smokeless tobacco: Never Used  . Tobacco comment: quit 2004  Substance Use Topics  . Alcohol use: Yes    Alcohol/week: 0.0 standard drinks    Comment: rarely  . Drug use: No     Medication list has been reviewed and updated.  Current Meds  Medication Sig  . albuterol (PROVENTIL HFA;VENTOLIN HFA) 108 (90 Base) MCG/ACT inhaler Inhale 2 puffs into the lungs every 4 (four) hours as needed for wheezing or shortness of breath.  . Ascorbic Acid (VITAMIN C) 1000 MG tablet Take 1,000 mg by mouth daily.  . Cholecalciferol 5000 units capsule Take 1 capsule (5,000 Units total) by mouth daily.  . cyclobenzaprine (FEXMID) 7.5 MG tablet Take 7.5 mg by mouth 2 (two) times daily as needed for muscle spasms.   . diclofenac (VOLTAREN) 75 MG EC tablet Take 75 mg by mouth 2 (two) times daily as needed for moderate pain.   Marland Kitchen ibuprofen (ADVIL,MOTRIN) 200 MG tablet Take 400 mg by mouth every 8 (eight) hours as needed for moderate  pain.  Marland Kitchen JANUVIA 100 MG tablet TAKE 1 TABLET (100 MG TOTAL) BY MOUTH DAILY.  Marland Kitchen lamoTRIgine (LAMICTAL) 100 MG tablet Take 300 mg by mouth every morning.   Marland Kitchen levothyroxine (SYNTHROID, LEVOTHROID) 175 MCG tablet TAKE 1 TABLET (175 MCG TOTAL) BY MOUTH DAILY.  Marland Kitchen losartan (COZAAR) 50 MG tablet TAKE 1 TABLET (50 MG TOTAL) BY MOUTH 2 (TWO) TIMES DAILY. (Patient taking differently: Take 50 mg by mouth 2 (two) times daily. )  . metFORMIN (GLUCOPHAGE) 500 MG tablet TAKE 1 TABLET BY MOUTH THREE TIMES A DAY *NEED TO SCHEDULE FOLLOW UP IN NEXT 30 DAYS*  . Multiple Vitamin (MULTIVITAMIN) tablet Take 1 tablet by mouth daily.  . Omega-3 Fatty Acids (FISH OIL) 1200 MG CAPS Take 1,200 mg by mouth daily.   Marland Kitchen oxybutynin (DITROPAN) 5 MG tablet Take 1 tablet (5 mg total) by mouth every 8 (eight) hours as needed for bladder spasms.  . ranitidine (ZANTAC) 150 MG tablet TAKE 1 TABLET BY MOUTH TWICE A DAY (Patient taking differently: Take 150 mg by mouth 2 (two) times daily. )  . triamcinolone (NASACORT ALLERGY 24HR)  55 MCG/ACT AERO nasal inhaler Place 2 sprays into the nose daily.    PHQ 2/9 Scores 01/27/2018 04/14/2017 12/19/2015 11/27/2015  PHQ - 2 Score 0 0 0 -  Exception Documentation Other- indicate reason in comment box - - Other- indicate reason in comment box  Not completed Pt is treated and followed by Psychiatry - - psychiatry appt next week    Physical Exam  Constitutional: She is oriented to person, place, and time. She appears well-developed. No distress.  HENT:  Head: Normocephalic and atraumatic.  Cardiovascular: Normal rate, regular rhythm and normal heart sounds.  Pulmonary/Chest: Effort normal and breath sounds normal. No respiratory distress.  Musculoskeletal: Normal range of motion.  Neurological: She is alert and oriented to person, place, and time.  Skin: Skin is warm and dry. No rash noted.  Psychiatric: She has a normal mood and affect. Her behavior is normal. Thought content normal.  Nursing  note and vitals reviewed.   BP 124/68 (BP Location: Right Arm, Patient Position: Sitting, Cuff Size: Normal)   Pulse 85   Ht 5\' 7"  (1.702 m)   Wt (!) 305 lb (138.3 kg)   SpO2 98%   BMI 47.77 kg/m   Assessment and Plan: 1. Essential hypertension Continue current therapy  2. Controlled type 2 diabetes mellitus without complication, without long-term current use of insulin ([place]) Continue current therapy See Endocrinology and Dietician  3. Lumbar back pain   Take flexeril at HS and nsaids - Ambulatory referral to Physical Therapy   Partially dictated using Dragon software. Any errors are unintentional.  Halina Maidens, MD [facility]  05/01/2018

## 2018-05-04 ENCOUNTER — Encounter: Payer: Self-pay | Admitting: Internal Medicine

## 2018-05-05 ENCOUNTER — Encounter: Payer: BLUE CROSS/BLUE SHIELD | Attending: Internal Medicine | Admitting: *Deleted

## 2018-05-05 ENCOUNTER — Encounter: Payer: Self-pay | Admitting: *Deleted

## 2018-05-05 VITALS — BP 158/98 | Ht 66.0 in | Wt 306.1 lb

## 2018-05-05 DIAGNOSIS — E1165 Type 2 diabetes mellitus with hyperglycemia: Secondary | ICD-10-CM

## 2018-05-05 DIAGNOSIS — E119 Type 2 diabetes mellitus without complications: Secondary | ICD-10-CM | POA: Insufficient documentation

## 2018-05-05 DIAGNOSIS — Z713 Dietary counseling and surveillance: Secondary | ICD-10-CM | POA: Insufficient documentation

## 2018-05-05 NOTE — Progress Notes (Signed)
Diabetes Self-Management Education  Visit Type: First/Initial  Appt. Start Time: 0910 Appt. End Time: 1030  05/05/2018  [name], identified by name and date of birth, is a 47 y.o. female with a diagnosis of Diabetes: Type 2.   ASSESSMENT  Blood pressure (!) 158/98, height '5\' 6"'  (1.676 m), weight (!) 306 lb 1.6 oz (138.8 kg). Body mass index is 49.41 kg/m.  Diabetes Self-Management Education - 05/05/18 1343      Visit Information   Visit Type  First/Initial      Initial Visit   Diabetes Type  Type 2    Are you currently following a meal plan?  No   "overeating"   Are you taking your medications as prescribed?  Yes    Date Diagnosed  3 years ago      Health Coping   How would you rate your overall health?  Poor      Psychosocial Assessment   Patient Belief/Attitude about Diabetes  Motivated to manage diabetes   "not debilitating - no negative connotations"   Self-care barriers  None    Self-management support  Doctor's office;Family    Patient Concerns  Nutrition/Meal planning;Medication;Monitoring;Healthy Lifestyle;Problem Solving;Glycemic Control;Weight Control    Special Needs  None    Preferred Learning Style  Auditory;Other (comment)   able to ask questions   Learning Readiness  Ready    How often do you need to have someone help you when you read instructions, pamphlets, or other written materials from your doctor or pharmacy?  1 - Never    What is the last grade level you completed in school?  high school      Pre-Education Assessment   Patient understands the diabetes disease and treatment process.  Needs Instruction    Patient understands incorporating nutritional management into lifestyle.  Needs Instruction    Patient undertands incorporating physical activity into lifestyle.  Needs Instruction    Patient understands using medications safely.  Needs Instruction    Patient understands prevention, detection, and treatment of acute complications.   Needs Instruction    Patient understands prevention, detection, and treatment of chronic complications.  Needs Review    Patient understands how to develop strategies to address psychosocial issues.  Needs Instruction    Patient understands how to develop strategies to promote health/change behavior.  Needs Instruction      Complications   Last HgB A1C per patient/outside source  8.5 %   04/17/18   How often do you check your blood sugar?  0 times/day (not testing)   Provided Contour Next One meter and instructed on use. BG upon return demonstration was 219 mg/dL at 10:15 am. Pt was drinking chai tea from [place] with sugar during this visit.     Have you had a dilated eye exam in the past 12 months?  Yes    Have you had a dental exam in the past 12 months?  Yes    Are you checking your feet?  No      Dietary Intake   Breakfast  skips    Snack (morning)  chips, cookies    Lunch  Eats out - fast foods    Snack (afternoon)  chips, cookies, raw veggies    Dinner  seafood, chicken, beef, bread, potatoes, rice, pasta, refried beans, salads - not many fibrous vegetables    Beverage(s)  water, regular soda, unsweetened tea, juice      Exercise   Exercise Type  ADL's  Patient Education   Previous Diabetes Education  Yes (please comment)   Pt was seen here 2 years ago. Came for only 1 visit due to insurance issues - she had not met her deductible and could not afford classes.     Disease state   Definition of diabetes, type 1 and 2, and the diagnosis of diabetes;Explored patient's options for treatment of their diabetes    Nutrition management   Role of diet in the treatment of diabetes and the relationship between the three main macronutrients and blood glucose level;Food label reading, portion sizes and measuring food.;Carbohydrate counting;Reviewed blood glucose goals for pre and post meals and how to evaluate the patients' food intake on their blood glucose level.;Meal timing in regards  to the patients' current diabetes medication.    Physical activity and exercise   Role of exercise on diabetes management, blood pressure control and cardiac health.    Medications  Reviewed patients medication for diabetes, action, purpose, timing of dose and side effects.    Monitoring  Taught/evaluated SMBG meter.;Purpose and frequency of SMBG.;Taught/discussed recording of test results and interpretation of SMBG.;Identified appropriate SMBG and/or A1C goals.    Chronic complications  Relationship between chronic complications and blood glucose control    Psychosocial adjustment  Identified and addressed patients feelings and concerns about diabetes      Individualized Goals (developed by patient)   Reducing Risk  Improve blood sugars Decrease medications Prevent diabetes complications Lose weight Lead a healthier lifestyle Become more fit     Outcomes   Expected Outcomes  Demonstrated interest in learning. Expect positive outcomes       Individualized Plan for Diabetes Self-Management Training:   Learning Objective:  Patient will have a greater understanding of diabetes self-management. Patient education plan is to attend individual and/or group sessions per assessed needs and concerns.   Plan:   Patient Instructions  Check blood sugars 2 x day before breakfast and 2 hrs after supper every day Bring blood sugar records to the next class Call your doctor for a prescription for:  1. Meter strips (type)    Contour Next  checking  2 times per day  2. Lancets (type)    Contour Microlet checking  2     times per day Exercise: Begin walking for 5-10  minutes 3 days a week and gradually increase to 30 minutes 5 x week Eat 3 meals day, 1-2  snacks a day Space meals 4-6 hours apart Don't skip meals Limit fried foods, snacks high in fat, desserts and sweets Avoid sugar sweetened drinks (soda, tea, juices)   Expected Outcomes:  Demonstrated interest in learning. Expect positive  outcomes  Education material provided: General Meal Planning Guidelines Simple Meal Plan Meter - Contour Next One  If problems or questions, patient to contact team via:  Lori Drilling, RN, CCM, CDE [phone]  Future DSME appointment:  Pt to check her work calendar and call back to schedule classes. Provided list of dates for both am and pm classes for the next few months.

## 2018-05-05 NOTE — Patient Instructions (Addendum)
Check blood sugars 2 x day before breakfast and 2 hrs after supper every day Bring blood sugar records to the next class  Call your doctor for a prescription for:  1. Meter strips (type)    Contour Next  checking  2 times per day  2. Lancets (type)    Contour Microlet checking  2     times per day  Exercise: Begin walking for 5-10  minutes 3 days a week and gradually increase to 30 minutes 5 x week  Eat 3 meals day, 1-2  snacks a day Space meals 4-6 hours apart Don't skip meals Limit fried foods, snacks high in fat, desserts and sweets Avoid sugar sweetened drinks (soda, tea, juices)  Return for classes on:

## 2018-05-07 ENCOUNTER — Encounter: Payer: Self-pay | Admitting: Internal Medicine

## 2018-05-08 MED ORDER — GLUCOSE BLOOD VI STRP: ORAL_STRIP | 12 refills | Status: DC

## 2018-05-10 ENCOUNTER — Encounter: Payer: Self-pay | Admitting: Physical Therapy

## 2018-05-10 ENCOUNTER — Other Ambulatory Visit: Payer: Self-pay

## 2018-05-10 ENCOUNTER — Ambulatory Visit: Payer: BLUE CROSS/BLUE SHIELD | Attending: Internal Medicine | Admitting: Physical Therapy

## 2018-05-10 DIAGNOSIS — M6281 Muscle weakness (generalized): Secondary | ICD-10-CM | POA: Diagnosis not present

## 2018-05-10 DIAGNOSIS — G8929 Other chronic pain: Secondary | ICD-10-CM | POA: Insufficient documentation

## 2018-05-10 DIAGNOSIS — M545 Low back pain: Secondary | ICD-10-CM | POA: Insufficient documentation

## 2018-05-10 NOTE — Patient Instructions (Signed)
Patient instructed to add 5 minutes of walking at a gentle pace daily and to focus on sleep hygiene.

## 2018-05-10 NOTE — Therapy (Signed)
[facility] [address] [place], [place], [address] Phone: [phone]   Fax:  [phone]  Physical Therapy Evaluation  Patient Details  Name: Lori Knight MRN: [id number] Date of Birth: July 19, 1970 Referring Provider (PT): Halina Maidens, MD   Encounter Date: 05/10/2018  PT End of Session - 05/10/18 1102    Visit Number  1    Number of Visits  9    Date for PT Re-Evaluation  06/07/18    PT Start Time  0801    PT Stop Time  0850    PT Time Calculation (min)  49 min    Activity Tolerance  Patient limited by pain;Patient tolerated treatment well    Behavior During Therapy  [facility] for tasks assessed/performed       Past Medical History:  Diagnosis Date  . Adult hypothyroidism 06/21/2012  . Bilateral polycystic ovarian syndrome 06/21/2012  . Biliary calculus with cholecystitis 08/14/2012  . Bipolar 1 disorder ([place])   . BP (high blood pressure) 02/14/2014  . Bronchitis   . Diabetes mellitus ([place]) 08/14/2012  . Diabetes mellitus without complication ([place])    pre-diabetic  . Dysrhythmia    wore heart monitor 2016. Corrected by changing Levothyroxine dose.  Marland Kitchen Heart murmur    followed by PCP-AS A CHILD-ASYMPTOMATIC  . Hyperprolactinemia ([place]) 06/21/2012  . Hypertension   . Motion sickness    carnival rides  . PONV (postoperative nausea and vomiting)    WAKES UP CRYING  . Shortness of breath dyspnea    stairs. related to wt.  . Sleep apnea    has CPAP. has not used since 2011  . Vertigo    2x in last yr    Past Surgical History:  Procedure Laterality Date  . ABDOMINAL HYSTERECTOMY  2012   cervical dysplasia/ovaries remian  . CHOLECYSTECTOMY    . COLONOSCOPY WITH PROPOFOL N/A 06/19/2015   Procedure: COLONOSCOPY WITH PROPOFOL;  Surgeon: [name], MD;  Location: [place];  Service: Endoscopy;  Laterality: N/A;  Diabetic - oral meds   . DENTAL SURGERY    . ETHMOIDECTOMY Bilateral 04/17/2018   Procedure:  ETHMOIDECTOMY;  Surgeon: [name], MD;  Location: [facility];  Service: ENT;  Laterality: Bilateral;  . GALLBLADDER SURGERY    . IMAGE GUIDED SINUS SURGERY N/A 04/17/2018   Procedure: IMAGE GUIDED SINUS SURGERY;  Surgeon: [name], MD;  Location: [facility];  Service: ENT;  Laterality: N/A;  . MAXILLARY ANTROSTOMY Bilateral 04/17/2018   Procedure: MAXILLARY ANTROSTOMY;  Surgeon: [name], MD;  Location: [facility];  Service: ENT;  Laterality: Bilateral;  . NASAL SEPTOPLASTY W/ TURBINOPLASTY Bilateral 04/17/2018   Procedure: NASAL SEPTOPLASTY WITH TURBINATE REDUCTION;  Surgeon: [name], MD;  Location: [facility];  Service: ENT;  Laterality: Bilateral;  . TRANSURETHRAL RESECTION OF BLADDER TUMOR N/A 04/17/2018   Procedure: NTRANSURETHRAL RESECTION OF BLADDER TUMOR (TURBT) WITH GEMCITABINE;  Surgeon: [name], MD;  Location: [facility];  Service: Urology;  Laterality: N/A;    There were no vitals filed for this visit.   Subjective Assessment - 05/10/18 1041    Subjective  Patient presents to clinic with c/o of R-sided LBP that began initially in 05/2017 after sustaining a fall on the L side. She states that the pain has increased over the last 5-6 months and has become unbearable. She has tried several differenet mattresses and feels that she has found one that is helping, but also states that she knows some of her  back pain is 2/2 to her weight gain and decreased activity level. She expressed motivation to manage her health better and has recently seen nutritional counseling and is attempting to better manage her DM-II and weight. She feels PT is the next step to getting her health under control.    Pertinent History  s/p bladder sx (04/2018), s/p sinus surgery (04/2018), hx of unmanaged DM-II, hx of fall (05/2017), uses CPAP during sleep, hx of mixed urinary incontinence >1 year, hx of bipolar 1 disorder    Limitations  Sitting;Walking;Lifting;House hold activities;Standing    How  long can you sit comfortably?  <10 min    How long can you stand comfortably?  <15 min    How long can you walk comfortably?  <7 min    Diagnostic tests  Xrays (per patient these were positive for age appropriate arthritic changes)    Patient Stated Goals  lose weight, decrease pain, be able to meet all demands at work    Currently in Pain?  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; limits sleep    Multiple Pain Sites  No         [facility] PT Assessment - 05/10/18 0001      Assessment   Medical Diagnosis  chronic low back pain    Referring Provider (PT)  Halina Maidens, MD    Onset Date/Surgical Date  03/12/17    Hand Dominance  Right    Next MD Visit  not scheduled    Prior Therapy  Yes: ankle rehab 03/2017 ([facility])      Precautions   Precautions  None      Restrictions   Weight Bearing Restrictions  No      Balance Screen   Has the patient fallen in the past 6 months  No      Prior Function   Level of Independence  Independent    Vocation  Full time employment    Programmer, multimedia; 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  Recent memory is intact.  Remote memory is intact.  Attention span and concentration are intact.  Expressive speech is intact.  Patient's fund of knowledge is within normal limits for educational level.  SENSATION: Grossly intact to light touch  bilateral L as determined by testing dermatomes L2-S2 Proprioception and hot/cold testing deferred on this date   MUSCULOSKELETAL: Tremor: None Bulk: Normal Tone: Normal  Posture Patient assumes a standing posture with significant anterior tilt of pelvis, wide BOS, and externally rotated hips. In sitting, the patient shifts away from the R side and reclines back at angle of ~60* to the left. Unable to rise from sitting without UE assistance. Upon shaking the patient's hand, she used the handshake  to assist in pulling herself up to standing.  Gait Wide BOS, externally rotated hips   Palpation Tender over L glute med, L glute min, R QL, R glute med, R glute min   Strength (out of 5) R/L 3+/3+ Hip flexion ** 3/3 Hip ER (sitting) 3/3 Hip IR (sitting) 3/3 Hip abduction (sitting)** 3/3 Hip adduction (sitting)** 3/3 Hip extension ** 4/4 Knee extension** 4/4 Knee flexion 5/5 Ankle dorsiflexion *Indicates pain   AROM (degrees)  R/L (all movements include overpressure unless otherwise stated) Lumbar forward flexion (0-65): Patient refused 2/2 to pain and fear of pain Lumbar extension (0-30): WNL Lumbar lateral flexion (0-25): B: WNL, painful R>L Lumbar rotation: WNL Hip IR (0-45): B: WFL * Hip ER (0-45): B: WFL Hip Flexion (0-125): B: WFL * Hip Abduction (0-40): B: WFL * Hip extension (0-15): B: WFL * *Indicates pain  Repeated Movements No centralization or peripheralization of symptoms with repeated lumbar extension or flexion.  Muscle Length Hamstrings: R: 60 degrees L: 50 degrees  Passive Accessory Intervertebral Motion (PAIVM) Pt denies reproduction of concordant pain with CPA L1-L5 and UPA bilaterally L1-L5. Generally hypomobile throughout.  Special Tests Lumbar quadrant: R: Positive L: Negative [place]ork: R: Negative L: Positive  for R sided lumbar pain SLR: R: Negative L:  Positive for R sided lumbar pain Slump: R: Negative L: Negative Ely: R: Positive L:  Positive FABER and FADDIR deferred 2/2 to soft tissue approximation.  Objective measurements completed on examination: See above findings.     PT Education - 05/10/18 1101    Education Details  prognosis, PNE (factors that can contribute to a pain experience)    Person(s) Educated  Patient    Methods  Explanation;Demonstration    Comprehension  Verbalized understanding;Need further instruction          PT Long Term Goals - 05/10/18 1125      PT LONG TERM GOAL #1   Title  Pt will be independent with HEP in order to improve strength and decrease back pain in order to improve pain-free function at home and work.     Baseline  Not initiated    Time  4    Period  Weeks    Status  New    Target Date  06/07/18      PT LONG TERM GOAL #2   Title  Pt will decrease worst back pain as reported on NPRS by at least 2 points in order to demonstrate clinically significant reduction in back pain.     Baseline  7/10    Time  4    Period  Weeks    Status  New    Target Date  06/07/18      PT LONG TERM GOAL #3   Title  Pt will increase B hip flexion and extension strength of by at least 1/2 MMT grade in order to demonstrate improvement in strength and function.    Baseline  B hip flexion: 3+/5, B hip extension 3/5    Time  4    Period  Weeks    Status  New    Target Date  06/07/18      PT LONG TERM GOAL #4   Title  Patient will be able to walk for >15 minutes at a 3% incline without increased pain or fatigue in order to be able to participate fully in work activities.    Baseline  <7 min, 0% inclince    Time  4    Period  Weeks    Status  New    Target Date  06/07/18             Plan - 05/10/18 1105    Clinical Impression Statement Patient is a pleasant 47 year-old female referred for chronic R-sided low back pain. PT examination reveals deficits in hip strength and extensibility (grossly 3/5 bilaterally, and + Ely's test), as well as decreased activity tolerance (walking < 7  min) and increased pain levels (7/10 at worst). Pt presents with deficits in strength, mobility, range of motion, and pain which will benefit from skilled therapeutic intervention in order to return to PLOF, meet work demands, and improve overall QOL.     History and Personal Factors relevant to plan of care:  (+) social support, good relationship with husband, motivated to manage health; (-) chronicity of condition, obesity, sedentary lifestyle    Clinical Presentation  Stable    Clinical Presentation due to: Examination reveals clinical deficits more indicative of decreased strength and muscle extensibility; patient is under managed care for other health concerns/comorbidities, and has excellent motivation.    Clinical Decision Making  Low    Rehab Potential  Good    PT Frequency  2x / week    PT Duration  4 weeks    PT Treatment/Interventions  Cryotherapy;Electrical Stimulation;Moist Heat;Ultrasound;Gait training;Stair training;Functional mobility training;Therapeutic activities;Therapeutic exercise;Balance training;Neuromuscular re-education;Patient/family education;Manual techniques;Passive range of motion;Dry needling;Energy conservation;Taping;Joint Manipulations;Spinal Manipulations    PT Next Visit Plan  TM walking, gentle motion for lumbar region, general stretching for BLE, strengthening of BLE    PT Home Exercise Plan  5 min walking/daily; sleep hygiene    Consulted and Agree with Plan of Care  Patient       Patient will benefit from skilled therapeutic intervention in order to improve the following deficits and impairments:  Decreased endurance, Decreased mobility, Difficulty walking, Decreased range of motion, Improper body mechanics, Obesity, Pain, Postural dysfunction, Decreased strength, Decreased activity tolerance  Visit Diagnosis: Chronic right-sided low back pain without sciatica  Muscle weakness (generalized)     Problem List Patient Active Problem List   Diagnosis  Date Noted  . Hypoxia 04/17/2018  . Plantar fasciitis 12/26/2017  . Cough in adult patient 12/26/2017  . Spasm of muscle of lower back 05/11/2017  . Fibroma of foot 03/03/2016  . Neuropathic pain of finger 03/03/2016  . Diabetes mellitus type 2, controlled, without complications ([place]) 13/02/6577  . OSA on CPAP 09/22/2015  . Intermittent palpitations 08/28/2015  . GERD (gastroesophageal reflux disease) 08/28/2015  . Insomnia 08/28/2015  . Internal hemorrhoids 08/28/2015  . Vitamin D deficiency 08/28/2015  . Obesity, Class III, BMI 40-49.9 (morbid obesity) ([place]) 08/27/2015  . Hypertension 02/14/2014  . Benign paroxysmal positional nystagmus 02/14/2014  . 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 Bipolar 1 disorder ([place]) 08/14/2012  . Adult hypothyroidism 06/21/2012  . Bilateral polycystic ovarian syndrome 06/21/2012    Myles Gip PT, DPT [phone] 05/10/2018, 11:32 AM  [facility] [address] [place], [place], [address] Phone: [phone]   Fax:  [phone]  Name: YOCELYN BROCIOUS MRN: [id number] Date of Birth: 29-Dec-1970

## 2018-05-12 DIAGNOSIS — F603 Borderline personality disorder: Secondary | ICD-10-CM | POA: Diagnosis not present

## 2018-05-12 DIAGNOSIS — F319 Bipolar disorder, unspecified: Secondary | ICD-10-CM | POA: Diagnosis not present

## 2018-05-16 ENCOUNTER — Encounter: Payer: Self-pay | Admitting: Internal Medicine

## 2018-05-17 ENCOUNTER — Encounter: Payer: Self-pay | Admitting: Physical Therapy

## 2018-05-17 ENCOUNTER — Ambulatory Visit: Payer: BLUE CROSS/BLUE SHIELD | Attending: Internal Medicine | Admitting: Physical Therapy

## 2018-05-17 DIAGNOSIS — M545 Low back pain, unspecified: Secondary | ICD-10-CM

## 2018-05-17 DIAGNOSIS — M6281 Muscle weakness (generalized): Secondary | ICD-10-CM | POA: Insufficient documentation

## 2018-05-17 DIAGNOSIS — G8929 Other chronic pain: Secondary | ICD-10-CM

## 2018-05-17 NOTE — Therapy (Signed)
[facility] [address] [place], [place], [address] Phone: [phone]   Fax:  [phone]  Physical Therapy Treatment  Patient Details  Name: Lori Knight MRN: [id number] Date of Birth: [birth year] Referring Provider (PT): Halina Maidens, MD   Encounter Date: 05/17/2018  PT End of Session - 05/17/18 0824    Visit Number  2    Number of Visits  9    Date for PT Re-Evaluation  06/07/18    PT Start Time  0816    PT Stop Time  0908    PT Time Calculation (min)  52 min    Activity Tolerance  Patient limited by pain;Patient tolerated treatment well    Behavior During Therapy  [facility] for tasks assessed/performed       Past Medical History:  Diagnosis Date  . Adult hypothyroidism 06/21/2012  . Bilateral polycystic ovarian syndrome 06/21/2012  . Biliary calculus with cholecystitis 08/14/2012  . Bipolar 1 disorder ([place])   . BP (high blood pressure) 02/14/2014  . Bronchitis   . Diabetes mellitus ([place]) 08/14/2012  . Diabetes mellitus without complication ([place])    pre-diabetic  . Dysrhythmia    wore heart monitor 2016. Corrected by changing Levothyroxine dose.  Marland Kitchen Heart murmur    followed by PCP-AS A CHILD-ASYMPTOMATIC  . Hyperprolactinemia ([place]) 06/21/2012  . Hypertension   . Motion sickness    carnival rides  . PONV (postoperative nausea and vomiting)    WAKES UP CRYING  . Shortness of breath dyspnea    stairs. related to wt.  . Sleep apnea    has CPAP. has not used since 2011  . Vertigo    2x in last yr    Past Surgical History:  Procedure Laterality Date  . ABDOMINAL HYSTERECTOMY  2012   cervical dysplasia/ovaries remian  . CHOLECYSTECTOMY    . COLONOSCOPY WITH PROPOFOL N/A 06/19/2015   Procedure: COLONOSCOPY WITH PROPOFOL;  Surgeon: [name], MD;  Location: [place];  Service: Endoscopy;  Laterality: N/A;  Diabetic - oral meds   . DENTAL SURGERY    . ETHMOIDECTOMY Bilateral 04/17/2018   Procedure:  ETHMOIDECTOMY;  Surgeon: [name], MD;  Location: [facility];  Service: ENT;  Laterality: Bilateral;  . GALLBLADDER SURGERY    . IMAGE GUIDED SINUS SURGERY N/A 04/17/2018   Procedure: IMAGE GUIDED SINUS SURGERY;  Surgeon: [name], MD;  Location: [facility];  Service: ENT;  Laterality: N/A;  . MAXILLARY ANTROSTOMY Bilateral 04/17/2018   Procedure: MAXILLARY ANTROSTOMY;  Surgeon: [name], MD;  Location: [facility];  Service: ENT;  Laterality: Bilateral;  . NASAL SEPTOPLASTY W/ TURBINOPLASTY Bilateral 04/17/2018   Procedure: NASAL SEPTOPLASTY WITH TURBINATE REDUCTION;  Surgeon: [name], MD;  Location: [facility];  Service: ENT;  Laterality: Bilateral;  . TRANSURETHRAL RESECTION OF BLADDER TUMOR N/A 04/17/2018   Procedure: NTRANSURETHRAL RESECTION OF BLADDER TUMOR (TURBT) WITH GEMCITABINE;  Surgeon: [name], MD;  Location: [facility];  Service: Urology;  Laterality: N/A;    There were no vitals filed for this visit.  Subjective Assessment - 05/17/18 0820    Subjective  Pt. reports more sharp pain since last week, which she has been working more over the past week.  Pt. notes she has not being feel well over the past week, and has been coughing more severely after her sickness has set in.  Pt. notes she is going to be seeing MD tomorrow for follow-up with C-Pap and for sickness.  Pertinent History  s/p bladder sx (04/2018), s/p sinus surgery (04/2018), hx of unmanaged DM-II, hx of fall (05/2017), uses CPAP during sleep, hx of mixed urinary incontinence >1 year, hx of bipolar 1 disorder    Limitations  Sitting;Walking;Lifting;House hold activities;Standing    How long can you sit comfortably?  <10 min    How long can you stand comfortably?  <15 min    How long can you walk comfortably?  <7 min    Diagnostic tests  Xrays (per patient these were positive for age appropriate arthritic changes)    Patient Stated Goals  lose weight, decrease pain, be able to meet all demands at work     Currently in Pain?  Yes    Pain Score  5     Pain Location  Back    Pain Orientation  Lower;Right    Pain Descriptors / Indicators  Sharp;Shooting;Aching;Discomfort    Pain Type  Chronic pain    Pain Onset  More than a month ago           Treatment    There Ex:  Prone Press-up with 60 sec holds, x4 Standing Lumbar Extension, x15 Supine Bridges, x10 TrA Progression, palpation with contraction, pelvic tilts, x multiple bouts    Manual:  Supine B Distal Hamstring Stretch 4x30 sec each Supine B Piriformis Stretch 4x30 sec each Supine B Figure-4 Stretch 4x30 sec each Supine B IR/ER Stretch 4x30 sec each Prone STM to paraspinals/gluteal region, 4 min      PT Long Term Goals - 05/10/18 1125      PT LONG TERM GOAL #1   Title  Pt will be independent with HEP in order to improve strength and decrease back pain in order to improve pain-free function at home and work.     Baseline  Not initiated    Time  4    Period  Weeks    Status  New    Target Date  06/07/18      PT LONG TERM GOAL #2   Title  Pt will decrease worst back pain as reported on NPRS by at least 2 points in order to demonstrate clinically significant reduction in back pain.     Baseline  7/10    Time  4    Period  Weeks    Status  New    Target Date  06/07/18      PT LONG TERM GOAL #3   Title  Pt will increase B hip flexion and extension strength of by at least 1/2 MMT grade in order to demonstrate improvement in strength and function.    Baseline  B hip flexion: 3+/5, B hip extension 3/5    Time  4    Period  Weeks    Status  New    Target Date  06/07/18      PT LONG TERM GOAL #4   Title  Patient will be able to walk for >15 minutes at a 3% incline without increased pain or fatigue in order to be able to participate fully in work activities.    Baseline  <7 min, 0% inclince    Time  4    Period  Weeks    Status  New    Target Date  06/07/18            Plan - 05/17/18 0900     Clinical Impression Statement  Pt. is hypersensitive to back and is unable to move much without  increase in pain.  Pt. was able to toelrate light touch to lower back, specifically on the right side.  Pt. notes breathing deeply is also painful.  Pt. introduced to TrA progression of exercises and encouraged to performed these on a consistent basis in oder to relieve back muscles and alleviate some of the paoin from overworked musculature.    Clinical Presentation  Stable    Clinical Decision Making  Low    Rehab Potential  Good    PT Frequency  2x / week    PT Duration  4 weeks    PT Treatment/Interventions  Cryotherapy;Electrical Stimulation;Moist Heat;Ultrasound;Gait training;Stair training;Functional mobility training;Therapeutic activities;Therapeutic exercise;Balance training;Neuromuscular re-education;Patient/family education;Manual techniques;Passive range of motion;Dry needling;Energy conservation;Taping;Joint Manipulations;Spinal Manipulations    PT Next Visit Plan  TM walking, gentle motion for lumbar region, general stretching for BLE, strengthening of BLE    PT Home Exercise Plan  5 min walking/daily; sleep hygiene    Consulted and Agree with Plan of Care  Patient       Patient will benefit from skilled therapeutic intervention in order to improve the following deficits and impairments:  Decreased endurance, Decreased mobility, Difficulty walking, Decreased range of motion, Improper body mechanics, Obesity, Pain, Postural dysfunction, Decreased strength, Decreased activity tolerance  Visit Diagnosis: Chronic right-sided low back pain without sciatica  Muscle weakness (generalized)     Problem List Patient Active Problem List   Diagnosis Date Noted  . Hypoxia 04/17/2018  . Plantar fasciitis 12/26/2017  . Cough in adult patient 12/26/2017  . Spasm of muscle of lower back 05/11/2017  . Fibroma of foot 03/03/2016  . Neuropathic pain of finger 03/03/2016  . Diabetes mellitus type  2, controlled, without complications ([place]) 19/41/7408  . OSA on CPAP 09/22/2015  . Intermittent palpitations 08/28/2015  . GERD (gastroesophageal reflux disease) 08/28/2015  . Insomnia 08/28/2015  . Internal hemorrhoids 08/28/2015  . Vitamin D deficiency 08/28/2015  . Obesity, Class III, BMI 40-49.9 (morbid obesity) ([place]) 08/27/2015  . Hypertension 02/14/2014  . Benign paroxysmal positional nystagmus 02/14/2014  . 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 Bipolar 1 disorder ([place]) 08/14/2012  . Adult hypothyroidism 06/21/2012  . Bilateral polycystic ovarian syndrome 06/21/2012   Pura Spice, PT, DPT # [id number] Gwenlyn Saran, SPT 05/18/2018, 11:23 AM  [facility] [address] [place], [place], [address] Phone: [phone]   Fax:  [phone]  Name: Lori Knight MRN: [id number] Date of Birth: Jul 11, 1971

## 2018-05-23 ENCOUNTER — Telehealth: Payer: Self-pay | Admitting: *Deleted

## 2018-05-23 DIAGNOSIS — R0602 Shortness of breath: Secondary | ICD-10-CM | POA: Diagnosis not present

## 2018-05-23 NOTE — Telephone Encounter (Signed)
Phone call to follow up on classes. Pt will attend series beginning Jun 12, 2018.

## 2018-05-24 ENCOUNTER — Other Ambulatory Visit: Payer: Self-pay

## 2018-05-25 ENCOUNTER — Ambulatory Visit: Payer: BLUE CROSS/BLUE SHIELD | Admitting: Physical Therapy

## 2018-05-26 DIAGNOSIS — Z48813 Encounter for surgical aftercare following surgery on the respiratory system: Secondary | ICD-10-CM | POA: Diagnosis not present

## 2018-05-30 ENCOUNTER — Other Ambulatory Visit: Payer: Self-pay | Admitting: Internal Medicine

## 2018-05-30 DIAGNOSIS — K219 Gastro-esophageal reflux disease without esophagitis: Secondary | ICD-10-CM

## 2018-05-31 ENCOUNTER — Encounter: Payer: Self-pay | Admitting: Internal Medicine

## 2018-05-31 ENCOUNTER — Ambulatory Visit: Payer: BLUE CROSS/BLUE SHIELD | Admitting: Physical Therapy

## 2018-05-31 DIAGNOSIS — F3181 Bipolar II disorder: Secondary | ICD-10-CM | POA: Diagnosis not present

## 2018-06-01 DIAGNOSIS — F603 Borderline personality disorder: Secondary | ICD-10-CM | POA: Diagnosis not present

## 2018-06-01 DIAGNOSIS — F319 Bipolar disorder, unspecified: Secondary | ICD-10-CM | POA: Diagnosis not present

## 2018-06-06 ENCOUNTER — Ambulatory Visit: Payer: BLUE CROSS/BLUE SHIELD | Admitting: Physical Therapy

## 2018-06-07 DIAGNOSIS — L578 Other skin changes due to chronic exposure to nonionizing radiation: Secondary | ICD-10-CM | POA: Diagnosis not present

## 2018-06-07 DIAGNOSIS — Z1283 Encounter for screening for malignant neoplasm of skin: Secondary | ICD-10-CM | POA: Diagnosis not present

## 2018-06-07 DIAGNOSIS — L918 Other hypertrophic disorders of the skin: Secondary | ICD-10-CM | POA: Diagnosis not present

## 2018-06-07 DIAGNOSIS — L570 Actinic keratosis: Secondary | ICD-10-CM | POA: Diagnosis not present

## 2018-06-07 DIAGNOSIS — D485 Neoplasm of uncertain behavior of skin: Secondary | ICD-10-CM | POA: Diagnosis not present

## 2018-06-12 ENCOUNTER — Ambulatory Visit: Payer: BLUE CROSS/BLUE SHIELD

## 2018-06-15 ENCOUNTER — Encounter: Payer: Self-pay | Admitting: *Deleted

## 2018-06-15 ENCOUNTER — Encounter: Payer: BLUE CROSS/BLUE SHIELD | Attending: Internal Medicine | Admitting: *Deleted

## 2018-06-15 VITALS — Wt 301.5 lb

## 2018-06-15 DIAGNOSIS — Z713 Dietary counseling and surveillance: Secondary | ICD-10-CM | POA: Insufficient documentation

## 2018-06-15 DIAGNOSIS — E119 Type 2 diabetes mellitus without complications: Secondary | ICD-10-CM | POA: Diagnosis not present

## 2018-06-15 DIAGNOSIS — E1165 Type 2 diabetes mellitus with hyperglycemia: Secondary | ICD-10-CM

## 2018-06-15 NOTE — Progress Notes (Signed)

## 2018-06-19 ENCOUNTER — Encounter: Payer: Self-pay | Admitting: Dietician

## 2018-06-19 ENCOUNTER — Ambulatory Visit: Payer: BLUE CROSS/BLUE SHIELD

## 2018-06-19 NOTE — Progress Notes (Signed)
Pt did not come to class 2 tonight. Called pt and pt will reschedule class 2 when she comes  next wk to class 3

## 2018-06-20 DIAGNOSIS — E1159 Type 2 diabetes mellitus with other circulatory complications: Secondary | ICD-10-CM | POA: Diagnosis not present

## 2018-06-20 DIAGNOSIS — E039 Hypothyroidism, unspecified: Secondary | ICD-10-CM | POA: Diagnosis not present

## 2018-06-20 DIAGNOSIS — F319 Bipolar disorder, unspecified: Secondary | ICD-10-CM | POA: Diagnosis not present

## 2018-06-20 DIAGNOSIS — F603 Borderline personality disorder: Secondary | ICD-10-CM | POA: Diagnosis not present

## 2018-06-20 DIAGNOSIS — J301 Allergic rhinitis due to pollen: Secondary | ICD-10-CM | POA: Diagnosis not present

## 2018-06-20 DIAGNOSIS — E1165 Type 2 diabetes mellitus with hyperglycemia: Secondary | ICD-10-CM | POA: Diagnosis not present

## 2018-06-26 ENCOUNTER — Ambulatory Visit: Payer: BLUE CROSS/BLUE SHIELD

## 2018-06-30 ENCOUNTER — Telehealth: Payer: Self-pay | Admitting: Dietician

## 2018-06-30 NOTE — Telephone Encounter (Signed)
Patient did not come to class 3 on 06/26/18 (or class 2 on 06/19/18). Left a voicemail message for her to call back to reschedule; offered next evening and next morning class dates.

## 2018-07-02 ENCOUNTER — Other Ambulatory Visit: Payer: Self-pay | Admitting: Internal Medicine

## 2018-07-10 ENCOUNTER — Other Ambulatory Visit: Payer: Self-pay | Admitting: Internal Medicine

## 2018-07-10 NOTE — Telephone Encounter (Signed)
lvm to call back-ah

## 2018-07-17 DIAGNOSIS — E1165 Type 2 diabetes mellitus with hyperglycemia: Secondary | ICD-10-CM | POA: Diagnosis not present

## 2018-07-17 DIAGNOSIS — E039 Hypothyroidism, unspecified: Secondary | ICD-10-CM | POA: Diagnosis not present

## 2018-07-17 LAB — HEMOGLOBIN A1C: HEMOGLOBIN A1C: 8

## 2018-07-21 ENCOUNTER — Encounter: Payer: Self-pay | Admitting: *Deleted

## 2018-07-21 ENCOUNTER — Ambulatory Visit: Payer: BLUE CROSS/BLUE SHIELD | Admitting: Urology

## 2018-07-21 DIAGNOSIS — K219 Gastro-esophageal reflux disease without esophagitis: Secondary | ICD-10-CM | POA: Diagnosis not present

## 2018-07-21 DIAGNOSIS — J301 Allergic rhinitis due to pollen: Secondary | ICD-10-CM | POA: Diagnosis not present

## 2018-07-21 DIAGNOSIS — R682 Dry mouth, unspecified: Secondary | ICD-10-CM | POA: Diagnosis not present

## 2018-07-21 DIAGNOSIS — G4733 Obstructive sleep apnea (adult) (pediatric): Secondary | ICD-10-CM | POA: Diagnosis not present

## 2018-07-21 DIAGNOSIS — F319 Bipolar disorder, unspecified: Secondary | ICD-10-CM | POA: Diagnosis not present

## 2018-07-27 DIAGNOSIS — F3181 Bipolar II disorder: Secondary | ICD-10-CM | POA: Diagnosis not present

## 2018-08-01 DIAGNOSIS — E1165 Type 2 diabetes mellitus with hyperglycemia: Secondary | ICD-10-CM | POA: Diagnosis not present

## 2018-08-01 DIAGNOSIS — E039 Hypothyroidism, unspecified: Secondary | ICD-10-CM | POA: Diagnosis not present

## 2018-08-01 DIAGNOSIS — I10 Essential (primary) hypertension: Secondary | ICD-10-CM | POA: Diagnosis not present

## 2018-08-15 DIAGNOSIS — L910 Hypertrophic scar: Secondary | ICD-10-CM | POA: Diagnosis not present

## 2018-08-20 ENCOUNTER — Other Ambulatory Visit: Payer: Self-pay | Admitting: Internal Medicine

## 2018-08-20 DIAGNOSIS — K219 Gastro-esophageal reflux disease without esophagitis: Secondary | ICD-10-CM

## 2018-09-05 DIAGNOSIS — Z79899 Other long term (current) drug therapy: Secondary | ICD-10-CM | POA: Diagnosis not present

## 2018-09-05 DIAGNOSIS — F3173 Bipolar disorder, in partial remission, most recent episode manic: Secondary | ICD-10-CM | POA: Diagnosis not present

## 2018-09-05 DIAGNOSIS — F411 Generalized anxiety disorder: Secondary | ICD-10-CM | POA: Diagnosis not present

## 2018-09-13 DIAGNOSIS — G4733 Obstructive sleep apnea (adult) (pediatric): Secondary | ICD-10-CM | POA: Diagnosis not present

## 2018-09-13 DIAGNOSIS — E039 Hypothyroidism, unspecified: Secondary | ICD-10-CM | POA: Diagnosis not present

## 2018-09-15 ENCOUNTER — Encounter: Payer: Self-pay | Admitting: Internal Medicine

## 2018-09-15 ENCOUNTER — Ambulatory Visit: Payer: BLUE CROSS/BLUE SHIELD | Admitting: Internal Medicine

## 2018-09-15 ENCOUNTER — Other Ambulatory Visit: Payer: Self-pay

## 2018-09-15 VITALS — BP 140/90 | HR 84 | Resp 16 | Ht 66.0 in | Wt 286.0 lb

## 2018-09-15 DIAGNOSIS — E039 Hypothyroidism, unspecified: Secondary | ICD-10-CM

## 2018-09-15 DIAGNOSIS — B3731 Acute candidiasis of vulva and vagina: Secondary | ICD-10-CM

## 2018-09-15 DIAGNOSIS — I10 Essential (primary) hypertension: Secondary | ICD-10-CM | POA: Diagnosis not present

## 2018-09-15 DIAGNOSIS — B373 Candidiasis of vulva and vagina: Secondary | ICD-10-CM

## 2018-09-15 DIAGNOSIS — R300 Dysuria: Secondary | ICD-10-CM

## 2018-09-15 DIAGNOSIS — E119 Type 2 diabetes mellitus without complications: Secondary | ICD-10-CM | POA: Diagnosis not present

## 2018-09-15 LAB — POCT URINALYSIS DIPSTICK
Bilirubin, UA: NEGATIVE
Glucose, UA: NEGATIVE
Ketones, UA: NEGATIVE
LEUKOCYTES UA: NEGATIVE
NITRITE UA: NEGATIVE
PH UA: 7 (ref 5.0–8.0)
PROTEIN UA: NEGATIVE
RBC UA: NEGATIVE
SPEC GRAV UA: 1.015 (ref 1.010–1.025)
Urobilinogen, UA: 0.2 E.U./dL

## 2018-09-15 MED ORDER — FLUCONAZOLE 100 MG PO TABS: 100.0000 mg | ORAL_TABLET | Freq: Every day | ORAL | 0 refills | Status: AC

## 2018-09-15 NOTE — Progress Notes (Signed)
Date:  09/15/2018   Name:  Lori Knight   DOB:  December 22, 1970   MRN:  [id number]   Chief Complaint: Dysuria  Dysuria   This is a new problem. The current episode started in the past 7 days. The problem has been unchanged. Quality: itching. The pain is mild. There has been no fever. Associated symptoms include a discharge. Pertinent negatives include no chills, sweats or vomiting. She has tried nothing for the symptoms.  Diabetes  She presents for her follow-up diabetic visit. She has type 2 diabetes mellitus. Her disease course has been improving. Pertinent negatives for hypoglycemia include no dizziness, headaches or sweats. Pertinent negatives for diabetes include no chest pain and no fatigue. Pertinent negatives for diabetic complications include no CVA, heart disease or peripheral neuropathy. Current diabetic treatment includes oral agent (dual therapy) (plus Trulicity). Her weight is decreasing steadily. An ACE inhibitor/angiotensin II receptor blocker is being taken.  Hypertension  This is a chronic problem. The problem is controlled. Pertinent negatives include no chest pain, headaches, palpitations, shortness of breath or sweats. Past treatments include angiotensin blockers. There are no compliance problems.  There is no history of CVA.  DM now being managed by Endocrinology.  Review of Systems  Constitutional: Negative for chills, fatigue and fever.  Respiratory: Negative for cough, chest tightness, shortness of breath and wheezing.   Cardiovascular: Negative for chest pain and palpitations.  Gastrointestinal: Negative for abdominal pain and vomiting.  Genitourinary: Positive for dysuria and vaginal discharge (and itching).  Neurological: Negative for dizziness, light-headedness and headaches.    Patient Active Problem List   Diagnosis Date Noted  . Hypoxia 04/17/2018  . Plantar fasciitis 12/26/2017  . Cough in adult patient 12/26/2017  . Spasm of muscle of lower back  05/11/2017  . Fibroma of foot 03/03/2016  . Neuropathic pain of finger 03/03/2016  . Type 2 diabetes mellitus with hyperglycemia, without long-term current use of insulin ([place]) 12/01/2015  . OSA on CPAP 09/22/2015  . Intermittent palpitations 08/28/2015  . GERD (gastroesophageal reflux disease) 08/28/2015  . Insomnia 08/28/2015  . Internal hemorrhoids 08/28/2015  . Vitamin D deficiency 08/28/2015  . Obesity, Class III, BMI 40-49.9 (morbid obesity) ([place]) 08/27/2015  . Hypertension 02/14/2014  . Benign paroxysmal positional nystagmus 02/14/2014  . 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 Bipolar 1 disorder ([place]) 08/14/2012  . Adult hypothyroidism 06/21/2012  . Bilateral polycystic ovarian syndrome 06/21/2012    Allergies  Allergen Reactions  . Hctz [Hydrochlorothiazide] Cough  . Lisinopril Cough  . Latex Rash    Condoms only  . Prednisone Anxiety    Paranoia    Past Surgical History:  Procedure Laterality Date  . ABDOMINAL HYSTERECTOMY  2012   cervical dysplasia/ovaries remian  . CHOLECYSTECTOMY    . COLONOSCOPY WITH PROPOFOL N/A 06/19/2015   Procedure: COLONOSCOPY WITH PROPOFOL;  Surgeon: [name], MD;  Location: [facility];  Service: Endoscopy;  Laterality: N/A;  Diabetic - oral meds   . DENTAL SURGERY    . ETHMOIDECTOMY Bilateral 04/17/2018   Procedure: ETHMOIDECTOMY;  Surgeon: [name], MD;  Location: [facility];  Service: ENT;  Laterality: Bilateral;  . GALLBLADDER SURGERY    . IMAGE GUIDED SINUS SURGERY N/A 04/17/2018   Procedure: IMAGE GUIDED SINUS SURGERY;  Surgeon: [name], MD;  Location: [facility];  Service: ENT;  Laterality: N/A;  . MAXILLARY ANTROSTOMY Bilateral 04/17/2018   Procedure: MAXILLARY ANTROSTOMY;  Surgeon: [name], MD;  Location: [facility];  Service: ENT;  Laterality: Bilateral;  . NASAL SEPTOPLASTY W/ TURBINOPLASTY Bilateral 04/17/2018   Procedure: NASAL SEPTOPLASTY WITH TURBINATE REDUCTION;  Surgeon: [name], MD;  Location:  [facility];  Service: ENT;  Laterality: Bilateral;  . TRANSURETHRAL RESECTION OF BLADDER TUMOR N/A 04/17/2018   Procedure: NTRANSURETHRAL RESECTION OF BLADDER TUMOR (TURBT) WITH GEMCITABINE;  Surgeon: [name], MD;  Location: [facility];  Service: Urology;  Laterality: N/A;    Social History   Tobacco Use  . Smoking status: Former Smoker    Packs/day: 1.00    Years: 10.00    Pack years: 10.00    Types: Cigarettes    Last attempt to quit: 07/12/2002    Years since quitting: 16.1  . Smokeless tobacco: Never Used  . Tobacco comment: quit 2004  Substance Use Topics  . Alcohol use: Yes    Alcohol/week: 0.0 standard drinks    Comment: rarely  . Drug use: No     Medication list has been reviewed and updated.  Current Meds  Medication Sig  . albuterol (PROVENTIL HFA;VENTOLIN HFA) 108 (90 Base) MCG/ACT inhaler Inhale 2 puffs into the lungs every 4 (four) hours as needed for wheezing or shortness of breath.  . Ascorbic Acid (VITAMIN C) 1000 MG tablet Take 1,000 mg by mouth daily.  . Cholecalciferol 5000 units capsule Take 1 capsule (5,000 Units total) by mouth daily.  . Dulaglutide 1.5 MG/0.5ML SOPN Inject 1.5 mg into the skin once a week.  Marland Kitchen glucose blood (CONTOUR NEXT TEST) test strip Use to check BS up to 3 times daily for Diabetes DX: E11.9  . ibuprofen (ADVIL,MOTRIN) 200 MG tablet Take 400 mg by mouth every 8 (eight) hours as needed for moderate pain.  Marland Kitchen JANUVIA 100 MG tablet TAKE 1 TABLET (100 MG TOTAL) BY MOUTH DAILY.  Marland Kitchen lamoTRIgine (LAMICTAL) 100 MG tablet Take 300 mg by mouth every morning.   Marland Kitchen levothyroxine (SYNTHROID, LEVOTHROID) 175 MCG tablet TAKE 1 TABLET (175 MCG TOTAL) BY MOUTH DAILY.  Marland Kitchen losartan (COZAAR) 100 MG tablet Take 0.5 tablets (50 mg total) by mouth 2 (two) times daily.  . metFORMIN (GLUCOPHAGE) 500 MG tablet Take 1 tablet (500 mg total) by mouth 3 (three) times daily.  . Multiple Vitamin (MULTIVITAMIN) tablet Take 1 tablet by mouth daily.  . Omega-3 Fatty  Acids (FISH OIL) 1200 MG CAPS Take 1,200 mg by mouth daily.   . ranitidine (ZANTAC) 150 MG tablet TAKE 1 TABLET BY MOUTH TWICE A DAY  . triamcinolone (NASACORT ALLERGY 24HR) 55 MCG/ACT AERO nasal inhaler Place 2 sprays into the nose daily.  . [DISCONTINUED] montelukast (SINGULAIR) 10 MG tablet Take 10 mg by mouth daily.    PHQ 2/9 Scores 09/15/2018 05/05/2018 01/27/2018 04/14/2017  PHQ - 2 Score 2 4 0 0  PHQ- 9 Score 2 9 - -  Exception Documentation - - Other- indicate reason in comment box -  Not completed - - Pt is treated and followed by Psychiatry -   Wt Readings from Last 3 Encounters:  09/15/18 286 lb (129.7 kg)  06/15/18 (!) 301 lb 8 oz (136.8 kg)  05/05/18 (!) 306 lb 1.6 oz (138.8 kg)    Physical Exam Vitals signs and nursing note reviewed.  Constitutional:      General: She is not in acute distress.    Appearance: She is well-developed.  HENT:     Head: Normocephalic and atraumatic.  Neck:     Musculoskeletal: Normal range of motion.  Cardiovascular:     Rate and Rhythm:  Normal rate and regular rhythm.  Pulmonary:     Effort: Pulmonary effort is normal. No respiratory distress.  Abdominal:     Tenderness: There is no abdominal tenderness.  Musculoskeletal: Normal range of motion.  Skin:    General: Skin is warm and dry.     Findings: No rash.  Neurological:     Mental Status: She is alert and oriented to person, place, and time.  Psychiatric:        Behavior: Behavior normal.        Thought Content: Thought content normal.     BP 140/90   Pulse 84   Resp 16   Ht 5\' 6"  (1.676 m)   Wt 286 lb (129.7 kg)   SpO2 96%   BMI 46.16 kg/m   Assessment and Plan: 1. Yeast vaginitis - fluconazole (DIFLUCAN) 100 MG tablet; Take 1 tablet (100 mg total) by mouth daily for 3 days.  Dispense: 3 tablet; Refill: 0  2. Dysuria UA negative - POCT urinalysis dipstick  3. Controlled type 2 diabetes mellitus without complication, without long-term current use of insulin  (HCC) Improved with additional medication  4. Essential hypertension Slightly elevated today - pt missed dose yesterday and this AM  5. Adult hypothyroidism Managed by Endo   Partially dictated using Editor, commissioning. Any errors are unintentional.  Halina Maidens, MD [facility]  09/15/2018

## 2018-09-24 ENCOUNTER — Other Ambulatory Visit: Payer: Self-pay | Admitting: Internal Medicine

## 2018-10-05 ENCOUNTER — Ambulatory Visit
Admission: RE | Admit: 2018-10-05 | Discharge: 2018-10-05 | Disposition: A | Discharge status: Home / Self Care | Payer: BLUE CROSS/BLUE SHIELD | Source: Ambulatory Visit | Attending: Urology | Admitting: Urology

## 2018-10-05 ENCOUNTER — Other Ambulatory Visit: Payer: Self-pay

## 2018-10-05 DIAGNOSIS — D1771 Benign lipomatous neoplasm of kidney: Secondary | ICD-10-CM | POA: Diagnosis not present

## 2018-10-05 DIAGNOSIS — R1031 Right lower quadrant pain: Secondary | ICD-10-CM | POA: Diagnosis not present

## 2018-10-06 ENCOUNTER — Encounter: Payer: Self-pay | Admitting: Internal Medicine

## 2018-10-06 ENCOUNTER — Telehealth: Payer: BLUE CROSS/BLUE SHIELD | Admitting: Urology

## 2018-10-06 ENCOUNTER — Telehealth: Payer: Self-pay | Admitting: Urology

## 2018-10-06 ENCOUNTER — Ambulatory Visit: Payer: BLUE CROSS/BLUE SHIELD | Admitting: Internal Medicine

## 2018-10-06 VITALS — BP 124/78 | HR 89 | Temp 98.1°F | Ht 66.0 in | Wt 281.0 lb

## 2018-10-06 DIAGNOSIS — R109 Unspecified abdominal pain: Secondary | ICD-10-CM

## 2018-10-06 DIAGNOSIS — B029 Zoster without complications: Secondary | ICD-10-CM | POA: Diagnosis not present

## 2018-10-06 LAB — POCT URINALYSIS DIPSTICK
BILIRUBIN UA: NEGATIVE
Blood, UA: NEGATIVE
Glucose, UA: NEGATIVE
KETONES UA: NEGATIVE
Leukocytes, UA: NEGATIVE
Nitrite, UA: NEGATIVE
Protein, UA: POSITIVE — AB
SPEC GRAV UA: 1.015 (ref 1.010–1.025)
UROBILINOGEN UA: 0.2 U/dL
pH, UA: 5 (ref 5.0–8.0)

## 2018-10-06 MED ORDER — GABAPENTIN 100 MG PO CAPS: 100.0000 mg | ORAL_CAPSULE | Freq: Every day | ORAL | 0 refills | Status: DC

## 2018-10-06 MED ORDER — VALACYCLOVIR HCL 1 G PO TABS: 1000.0000 mg | ORAL_TABLET | Freq: Three times a day (TID) | ORAL | 0 refills | Status: DC

## 2018-10-06 NOTE — Telephone Encounter (Signed)
Reviewed RUS and did not note any findings that would explain her back pain and nausea.  She should seek care in the ED.

## 2018-10-06 NOTE — Progress Notes (Signed)
Date:  10/06/2018   Name:  Lori Knight   DOB:  08-27-1970   MRN:  [id number]   Chief Complaint: Urinary Tract Infection (X 1 week everything on left flank is sore. Right arm is also sore. [place] tech almost made her cry just touching her stomach. Also on right side of back hurts up to shoulder blade. )  Flank Pain  This is a new problem. Episode onset: about two weeks ago. The problem occurs constantly. The problem is unchanged. Pain location: over right flank up to right shoulder blade and trapezious area. The quality of the pain is described as burning and shooting. The pain is moderate. The symptoms are aggravated by twisting (and light touch). Associated symptoms include abdominal pain (several episodes of sharp suprapubic pain). Pertinent negatives include no bladder incontinence, chest pain, dysuria, fever, numbness, paresis or perianal numbness.    Review of Systems  Constitutional: Negative for chills, fatigue and fever.  HENT: Negative for trouble swallowing.   Respiratory: Negative for cough, chest tightness and shortness of breath.   Cardiovascular: Negative for chest pain, palpitations and leg swelling.  Gastrointestinal: Positive for abdominal pain (several episodes of sharp suprapubic pain).  Genitourinary: Positive for flank pain. Negative for bladder incontinence, dysuria, hematuria and urgency.  Musculoskeletal: Positive for myalgias.  Skin: Negative for color change and rash.  Neurological: Negative for numbness.  Psychiatric/Behavioral: Negative for sleep disturbance.    Patient Active Problem List   Diagnosis Date Noted  . Hypoxia 04/17/2018  . Plantar fasciitis 12/26/2017  . Cough in adult patient 12/26/2017  . Spasm of muscle of lower back 05/11/2017  . Fibroma of foot 03/03/2016  . Neuropathic pain of finger 03/03/2016  . Type 2 diabetes mellitus with hyperglycemia, without long-term current use of insulin ([place]) 12/01/2015  . OSA on CPAP 09/22/2015  .  Intermittent palpitations 08/28/2015  . GERD (gastroesophageal reflux disease) 08/28/2015  . Insomnia 08/28/2015  . Internal hemorrhoids 08/28/2015  . Vitamin D deficiency 08/28/2015  . Obesity, Class III, BMI 40-49.9 (morbid obesity) ([place]) 08/27/2015  . Hypertension 02/14/2014  . Benign paroxysmal positional nystagmus 02/14/2014  . 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 Bipolar 1 disorder ([place]) 08/14/2012  . Adult hypothyroidism 06/21/2012  . Bilateral polycystic ovarian syndrome 06/21/2012    Allergies  Allergen Reactions  . Hctz [Hydrochlorothiazide] Cough  . Lisinopril Cough  . Latex Rash    Condoms only  . Prednisone Anxiety    Paranoia    Past Surgical History:  Procedure Laterality Date  . ABDOMINAL HYSTERECTOMY  2012   cervical dysplasia/ovaries remian  . CHOLECYSTECTOMY    . COLONOSCOPY WITH PROPOFOL N/A 06/19/2015   Procedure: COLONOSCOPY WITH PROPOFOL;  Surgeon: [name], MD;  Location: [facility];  Service: Endoscopy;  Laterality: N/A;  Diabetic - oral meds   . DENTAL SURGERY    . ETHMOIDECTOMY Bilateral 04/17/2018   Procedure: ETHMOIDECTOMY;  Surgeon: [name], MD;  Location: [facility];  Service: ENT;  Laterality: Bilateral;  . GALLBLADDER SURGERY    . IMAGE GUIDED SINUS SURGERY N/A 04/17/2018   Procedure: IMAGE GUIDED SINUS SURGERY;  Surgeon: [name], MD;  Location: [facility];  Service: ENT;  Laterality: N/A;  . MAXILLARY ANTROSTOMY Bilateral 04/17/2018   Procedure: MAXILLARY ANTROSTOMY;  Surgeon: [name], MD;  Location: [facility];  Service: ENT;  Laterality: Bilateral;  . NASAL SEPTOPLASTY W/ TURBINOPLASTY Bilateral 04/17/2018   Procedure: NASAL SEPTOPLASTY WITH TURBINATE REDUCTION;  Surgeon: [name], MD;  Location: [facility];  Service: ENT;  Laterality: Bilateral;  . TRANSURETHRAL RESECTION OF BLADDER TUMOR N/A 04/17/2018   Procedure: NTRANSURETHRAL RESECTION OF BLADDER TUMOR (TURBT) WITH GEMCITABINE;  Surgeon: [name], MD;   Location: [facility];  Service: Urology;  Laterality: N/A;    Social History   Tobacco Use  . Smoking status: Former Smoker    Packs/day: 1.00    Years: 10.00    Pack years: 10.00    Types: Cigarettes    Last attempt to quit: 07/12/2002    Years since quitting: 16.2  . Smokeless tobacco: Never Used  . Tobacco comment: quit 2004  Substance Use Topics  . Alcohol use: Yes    Alcohol/week: 0.0 standard drinks    Comment: rarely  . Drug use: No     Medication list has been reviewed and updated.  Current Meds  Medication Sig  . albuterol (PROVENTIL HFA;VENTOLIN HFA) 108 (90 Base) MCG/ACT inhaler Inhale 2 puffs into the lungs every 4 (four) hours as needed for wheezing or shortness of breath.  . Ascorbic Acid (VITAMIN C) 1000 MG tablet Take 1,000 mg by mouth daily.  . Cholecalciferol 5000 units capsule Take 1 capsule (5,000 Units total) by mouth daily.  . cyclobenzaprine (FEXMID) 7.5 MG tablet Take 7.5 mg by mouth 2 (two) times daily as needed for muscle spasms.   . Dulaglutide 1.5 MG/0.5ML SOPN Inject 1.5 mg into the skin once a week.  Marland Kitchen glucose blood (CONTOUR NEXT TEST) test strip Use to check BS up to 3 times daily for Diabetes DX: E11.9  . ibuprofen (ADVIL,MOTRIN) 200 MG tablet Take 400 mg by mouth every 8 (eight) hours as needed for moderate pain.  Marland Kitchen JANUVIA 100 MG tablet TAKE 1 TABLET (100 MG TOTAL) BY MOUTH DAILY.  Marland Kitchen lamoTRIgine (LAMICTAL) 100 MG tablet Take 300 mg by mouth every morning.   Marland Kitchen levothyroxine (SYNTHROID, LEVOTHROID) 175 MCG tablet TAKE 1 TABLET (175 MCG TOTAL) BY MOUTH DAILY.  Marland Kitchen losartan (COZAAR) 100 MG tablet Take 0.5 tablets (50 mg total) by mouth 2 (two) times daily.  Marland Kitchen lurasidone (LATUDA) 40 MG TABS tablet Take 20 mg by mouth at bedtime.  . magic mouthwash SOLN Take 5 mLs by mouth 3 (three) times daily.   . metFORMIN (GLUCOPHAGE) 500 MG tablet Take 1 tablet (500 mg total) by mouth 3 (three) times daily.  . Multiple Vitamin (MULTIVITAMIN) tablet Take 1 tablet by  mouth daily.  . Omega-3 Fatty Acids (FISH OIL) 1200 MG CAPS Take 1,200 mg by mouth daily.   . ranitidine (ZANTAC) 150 MG tablet TAKE 1 TABLET BY MOUTH TWICE A DAY  . triamcinolone (NASACORT ALLERGY 24HR) 55 MCG/ACT AERO nasal inhaler Place 2 sprays into the nose daily.    PHQ 2/9 Scores 09/15/2018 05/05/2018 01/27/2018 04/14/2017  PHQ - 2 Score 2 4 0 0  PHQ- 9 Score 2 9 - -  Exception Documentation - - Other- indicate reason in comment box -  Not completed - - Pt is treated and followed by Psychiatry -    BP Readings from Last 3 Encounters:  10/06/18 124/78  09/15/18 140/90  05/05/18 (!) 158/98    Physical Exam Vitals signs and nursing note reviewed.  Constitutional:      General: She is not in acute distress.    Appearance: Normal appearance. She is well-developed.     Comments: She appears slightly uncomfortable  HENT:     Head: Normocephalic and atraumatic.  Cardiovascular:     Rate and  Rhythm: Normal rate and regular rhythm.     Pulses: Normal pulses.  Pulmonary:     Effort: Pulmonary effort is normal. No respiratory distress.     Breath sounds: Normal breath sounds. No wheezing or rales.  Musculoskeletal: Normal range of motion.  Lymphadenopathy:     Cervical: No cervical adenopathy.  Skin:    General: Skin is warm and dry.     Findings: No rash.     Comments: Hyperesthesias to light touch over right flank, right shoulder and right upper arm  Neurological:     Mental Status: She is alert and oriented to person, place, and time.  Psychiatric:        Behavior: Behavior normal.        Thought Content: Thought content normal.     Wt Readings from Last 3 Encounters:  10/06/18 281 lb (127.5 kg)  09/15/18 286 lb (129.7 kg)  06/15/18 (!) 301 lb 8 oz (136.8 kg)    BP 124/78   Pulse 89   Temp 98.1 F (36.7 C) (Oral)   Ht 5\' 6"  (1.676 m)   Wt 281 lb (127.5 kg)   SpO2 97%   BMI 45.35 kg/m   Assessment and Plan: 1. Herpes zoster without complication Suspect  zoster without rash Call or message next if worsening - valACYclovir (VALTREX) 1000 MG tablet; Take 1 tablet (1,000 mg total) by mouth 3 (three) times daily.  Dispense: 21 tablet; Refill: 0 - gabapentin (NEURONTIN) 100 MG capsule; Take 1-3 capsules (100-300 mg total) by mouth at bedtime.  Dispense: 60 capsule; Refill: 0   Partially dictated using Editor, commissioning. Any errors are unintentional.  Halina Maidens, MD [facility]  10/06/2018

## 2018-10-06 NOTE — Telephone Encounter (Signed)
Notified patient that Larene Beach recommends going to the emergency room due to no findings on RUS that would explain her back pain and nausea. She states she has an appointment with her PCP today. Per Larene Beach, advised patient to keep that appointment and go to the emergency room afterward. Questions answered. She expresses understanding of conversation.

## 2018-10-09 ENCOUNTER — Ambulatory Visit: Payer: BLUE CROSS/BLUE SHIELD | Admitting: Urology

## 2018-10-10 ENCOUNTER — Telehealth: Payer: Self-pay | Admitting: Urology

## 2018-10-10 ENCOUNTER — Other Ambulatory Visit: Payer: Self-pay

## 2018-10-10 ENCOUNTER — Telehealth (INDEPENDENT_AMBULATORY_CARE_PROVIDER_SITE_OTHER): Payer: BLUE CROSS/BLUE SHIELD | Admitting: Urology

## 2018-10-10 DIAGNOSIS — L578 Other skin changes due to chronic exposure to nonionizing radiation: Secondary | ICD-10-CM | POA: Diagnosis not present

## 2018-10-10 DIAGNOSIS — D1771 Benign lipomatous neoplasm of kidney: Secondary | ICD-10-CM

## 2018-10-10 DIAGNOSIS — L910 Hypertrophic scar: Secondary | ICD-10-CM | POA: Diagnosis not present

## 2018-10-10 DIAGNOSIS — Z87448 Personal history of other diseases of urinary system: Secondary | ICD-10-CM

## 2018-10-10 DIAGNOSIS — Z872 Personal history of diseases of the skin and subcutaneous tissue: Secondary | ICD-10-CM | POA: Diagnosis not present

## 2018-10-10 DIAGNOSIS — L918 Other hypertrophic disorders of the skin: Secondary | ICD-10-CM | POA: Diagnosis not present

## 2018-10-10 DIAGNOSIS — L570 Actinic keratosis: Secondary | ICD-10-CM | POA: Diagnosis not present

## 2018-10-10 DIAGNOSIS — Z86018 Personal history of other benign neoplasm: Secondary | ICD-10-CM | POA: Diagnosis not present

## 2018-10-10 NOTE — Telephone Encounter (Signed)
Please call Lori Knight and have her schedule a follow up visit in October for a microscopic hematuria follow up.

## 2018-10-10 NOTE — Progress Notes (Signed)
Virtual Visit via Telephone Note  I connected with Lori Knight on 10/10/2018 at [phone] by telephone and verified that I am speaking with the correct person using two identifiers.  They are located at home.  I am located at my home.    This visit type was conducted due to national recommendations for restrictions regarding the COVID-19 Pandemic (e.g. social distancing).  This format is felt to be most appropriate for this patient at this time.  All issues noted in this document were discussed and addressed.  No physical exam was performed.   I discussed the limitations, risks, security and privacy concerns of performing an evaluation and management service by telephone and the availability of in person appointments. I also discussed with the patient that there may be a patient responsible charge related to this service. The patient expressed understanding and agreed to proceed.   History of Present Illness: Lori Knight is a 48 year old Caucasian female who underwent a hematuria work-up in October 2019.  Findings were positive for bilateral renal angiomyolipomas and a bladder lesion.  She did undergo biopsy of that bladder lesion and it was found to be noncancerous.  She had contacted the office last week with a complaints of right-sided flank pain associated with nausea.   As the findings on the renal ultrasound did not explain her symptomatology, she is currently being followed by her primary care physician, Dr. Army Melia, for possible herpes zoster.    At this time, she currently is not experiencing any urinary symptoms.  Patient denies any gross hematuria, dysuria or suprapubic/flank pain.  Patient denies any fevers, chills, nausea or vomiting.     Observations/Objective: Renal ultrasound completed on October 05, 2018 noted a 3.1 x 4.1 x 3.6 cm right renal angiomyolipoma and a 6 x 6 x 7 mm left renal angiomyolipoma.  These are unchanged when compared to the CT urogram performed in September  2019 where the right lower pole angiomyolipoma measured 3.7 cm and the left renal angiomyolipoma was 6 mm in size.    Assessment and Plan:  1. History of hematuria Hematuria work up completed in 04/2018 - findings positive for bilateral angiomyolipomas - bladder bx NED No report of gross hematuria   2. Bilateral angiomyolipomas Unchanged from previous CTU in September 2019 Repeat ultrasound in March 2021 for surveillance of interval growth Patient is advised to contact the office if she should experience flank pain or gross hematuria  Follow Up Instructions:  Patient to follow-up in October for repeat UA and symptomatic recheck    I discussed the assessment and treatment plan with the patient. The patient was provided an opportunity to ask questions and all were answered. The patient agreed with the plan and demonstrated an understanding of the instructions.   The patient was advised to call back or seek an in-person evaluation if the symptoms worsen or if the condition fails to improve as anticipated.  I provided 12 minutes of non-face-to-face time during this encounter.   Aidynn Polendo, PA-C

## 2018-10-12 ENCOUNTER — Encounter: Payer: Self-pay | Admitting: Internal Medicine

## 2018-10-14 ENCOUNTER — Other Ambulatory Visit: Payer: Self-pay | Admitting: Internal Medicine

## 2018-10-28 ENCOUNTER — Other Ambulatory Visit: Payer: Self-pay | Admitting: Internal Medicine

## 2018-10-28 DIAGNOSIS — B029 Zoster without complications: Secondary | ICD-10-CM

## 2018-10-31 DIAGNOSIS — I10 Essential (primary) hypertension: Secondary | ICD-10-CM | POA: Diagnosis not present

## 2018-10-31 DIAGNOSIS — E039 Hypothyroidism, unspecified: Secondary | ICD-10-CM | POA: Diagnosis not present

## 2018-10-31 DIAGNOSIS — E1165 Type 2 diabetes mellitus with hyperglycemia: Secondary | ICD-10-CM | POA: Diagnosis not present

## 2018-11-02 NOTE — Telephone Encounter (Signed)
error 

## 2018-11-03 ENCOUNTER — Other Ambulatory Visit: Payer: Self-pay | Admitting: Internal Medicine

## 2018-11-03 DIAGNOSIS — B029 Zoster without complications: Secondary | ICD-10-CM

## 2018-11-04 NOTE — Telephone Encounter (Signed)
LVM. Awaiting cb. Will call again Monday.

## 2018-11-06 NOTE — Telephone Encounter (Signed)
LVM awaiting cb.

## 2018-11-08 ENCOUNTER — Ambulatory Visit: Payer: BLUE CROSS/BLUE SHIELD

## 2018-11-13 ENCOUNTER — Ambulatory Visit: Payer: BLUE CROSS/BLUE SHIELD | Admitting: Urology

## 2018-11-13 DIAGNOSIS — F603 Borderline personality disorder: Secondary | ICD-10-CM | POA: Diagnosis not present

## 2018-11-13 DIAGNOSIS — F319 Bipolar disorder, unspecified: Secondary | ICD-10-CM | POA: Diagnosis not present

## 2018-11-17 DIAGNOSIS — F319 Bipolar disorder, unspecified: Secondary | ICD-10-CM | POA: Diagnosis not present

## 2018-11-17 DIAGNOSIS — F603 Borderline personality disorder: Secondary | ICD-10-CM | POA: Diagnosis not present

## 2018-11-18 ENCOUNTER — Encounter: Payer: Self-pay | Admitting: Internal Medicine

## 2018-11-22 ENCOUNTER — Encounter: Payer: Self-pay | Admitting: Internal Medicine

## 2018-11-23 ENCOUNTER — Other Ambulatory Visit: Payer: Self-pay

## 2018-11-23 DIAGNOSIS — J40 Bronchitis, not specified as acute or chronic: Secondary | ICD-10-CM

## 2018-11-23 MED ORDER — ALBUTEROL SULFATE HFA 108 (90 BASE) MCG/ACT IN AERS: 2.0000 | INHALATION_SPRAY | RESPIRATORY_TRACT | 1 refills | Status: DC | PRN

## 2018-11-23 MED ORDER — FAMOTIDINE 20 MG PO TABS: 20.0000 mg | ORAL_TABLET | Freq: Two times a day (BID) | ORAL | 1 refills | Status: DC

## 2018-11-24 DIAGNOSIS — F3173 Bipolar disorder, in partial remission, most recent episode manic: Secondary | ICD-10-CM | POA: Diagnosis not present

## 2018-11-24 DIAGNOSIS — F411 Generalized anxiety disorder: Secondary | ICD-10-CM | POA: Diagnosis not present

## 2018-11-24 DIAGNOSIS — F603 Borderline personality disorder: Secondary | ICD-10-CM | POA: Diagnosis not present

## 2018-11-24 DIAGNOSIS — F4323 Adjustment disorder with mixed anxiety and depressed mood: Secondary | ICD-10-CM | POA: Diagnosis not present

## 2018-11-27 ENCOUNTER — Other Ambulatory Visit: Payer: Self-pay | Admitting: Internal Medicine

## 2018-11-27 DIAGNOSIS — B029 Zoster without complications: Secondary | ICD-10-CM

## 2018-11-27 MED ORDER — GABAPENTIN 300 MG PO CAPS: 300.0000 mg | ORAL_CAPSULE | Freq: Every day | ORAL | 0 refills | Status: DC

## 2018-11-28 ENCOUNTER — Other Ambulatory Visit: Payer: Self-pay | Admitting: Internal Medicine

## 2018-11-28 ENCOUNTER — Encounter: Payer: Self-pay | Admitting: Internal Medicine

## 2018-11-28 DIAGNOSIS — R413 Other amnesia: Secondary | ICD-10-CM

## 2018-11-28 NOTE — Telephone Encounter (Signed)
Please advise and let me know how to respond? I can let her know she needs to come in before she gets a referral but anything you would want to add?

## 2018-11-28 NOTE — Telephone Encounter (Signed)
Response to you. Just FYI....Marland KitchenMarland Kitchen

## 2018-11-28 NOTE — Telephone Encounter (Signed)
Would patient need to come back in to be seen for this?

## 2018-12-01 DIAGNOSIS — F603 Borderline personality disorder: Secondary | ICD-10-CM | POA: Diagnosis not present

## 2018-12-01 DIAGNOSIS — F319 Bipolar disorder, unspecified: Secondary | ICD-10-CM | POA: Diagnosis not present

## 2018-12-08 DIAGNOSIS — E538 Deficiency of other specified B group vitamins: Secondary | ICD-10-CM | POA: Diagnosis not present

## 2018-12-08 DIAGNOSIS — F3173 Bipolar disorder, in partial remission, most recent episode manic: Secondary | ICD-10-CM | POA: Diagnosis not present

## 2018-12-08 DIAGNOSIS — F603 Borderline personality disorder: Secondary | ICD-10-CM | POA: Diagnosis not present

## 2018-12-08 DIAGNOSIS — F411 Generalized anxiety disorder: Secondary | ICD-10-CM | POA: Diagnosis not present

## 2018-12-08 DIAGNOSIS — R42 Dizziness and giddiness: Secondary | ICD-10-CM | POA: Diagnosis not present

## 2018-12-08 DIAGNOSIS — R413 Other amnesia: Secondary | ICD-10-CM | POA: Diagnosis not present

## 2018-12-08 DIAGNOSIS — F4323 Adjustment disorder with mixed anxiety and depressed mood: Secondary | ICD-10-CM | POA: Diagnosis not present

## 2018-12-08 DIAGNOSIS — E559 Vitamin D deficiency, unspecified: Secondary | ICD-10-CM | POA: Diagnosis not present

## 2018-12-19 ENCOUNTER — Other Ambulatory Visit: Payer: Self-pay

## 2018-12-19 ENCOUNTER — Other Ambulatory Visit: Payer: Self-pay | Admitting: Neurology

## 2018-12-19 DIAGNOSIS — I639 Cerebral infarction, unspecified: Secondary | ICD-10-CM

## 2018-12-19 NOTE — Progress Notes (Signed)
[facility]  [address] [place], [place] [address] Phone: [phone]  Fax: [phone]   Clinic Day:  12/20/2018  Referring physician: Jannifer Franklin, NP  Chief Complaint: Lori Knight is a 48 y.o. female with elevated ferritin level who is referred in consultation with Jannifer Franklin, NP for assessment and management.   HPI:  The patient was noted to have an elevated ferritin level during an evaluation for memory loss. Ferritin was 209 on 12/08/2018.  Labs on 10/31/2018 revealed an AST 53, ALT 102, and bilirubin 0.4.  Symptomatically, she reports her eyes "feel swollen."  She can't touch [employer] boxes, or things like that, due to a feeling that she says is like when you hear "nails on a chalkboard."  She reports "the feeling in her hands goes crazy."  Symptoms improve with cold water.   She has had recent memory issues. Long-term memory is not affected, but she has times where she cannot recall the name of a room in her house or information at work. She has had several recent falls where she doesn't trip, but is unsure why she falls.  She has had symptoms for 6-7 months. She denies any focal weakness or numbness. Head MRI is scheduled for 01/01/2019.  She regularly has diarrhea.  In 04/2018, she had blood in her urine and stool, which has since resolved.  She has been seen by urology.  Notes from [facility] on 10/10/2018 revealed a hematuria work-up in 04/2018 with findings positive for bilateral angiomyolipomas.  Cystoscopy on 04/17/2018 by Dr. [name] revealed a 1 cm spherical papillary tumor on a narrow stalk on the anterior bladder wall which was resected. Bladder biopsy on 04/17/2018 revealed no atypia or malignancy.    She received intravesical gemcitabine.  She has low to mid back pain, worse when standing up from sitting or lying down. She has pain in her lower legs. She has sharp pains in her right pinky finger. She denies any  skin changes. She has itching in her hands after touching something.  She reports her skin is sensitive underneath.   She stopped eating meat in 05/2018, and does not regularly eat green leafy vegetables. She denies any fevers, cough, chest pain, or shortness of breath, or sweats. She lost 15 pounds after starting Trulicity.   She denies any family history of any blood disorders. She denies any known exposure to hepatitis. She has never had a blood transfusion.   She has sleep apnea and is getting a new machine tomorrow. She has diabetes, well-controlled on Trulicity. She has a history of PCOS. She has a history of hyperprolactinemia, although she reports she has not been tested for it in a long time.  She had a hysterectomy in 2011.    Past Medical History:  Diagnosis Date  . Adult hypothyroidism 06/21/2012  . Bilateral polycystic ovarian syndrome 06/21/2012  . Biliary calculus with cholecystitis 08/14/2012  . Bipolar 1 disorder ([place])   . BP (high blood pressure) 02/14/2014  . Bronchitis   . Diabetes mellitus ([place]) 08/14/2012  . Diabetes mellitus without complication ([place])    pre-diabetic  . Dysrhythmia    wore heart monitor 2016. Corrected by changing Levothyroxine dose.  Marland Kitchen Heart murmur    followed by PCP-AS A CHILD-ASYMPTOMATIC  . Hyperprolactinemia ([place]) 06/21/2012  . Hypertension   . Motion sickness    carnival rides  . PONV (postoperative nausea and vomiting)    WAKES UP CRYING  .  Shortness of breath dyspnea    stairs. related to wt.  . Sleep apnea    has CPAP. has not used since 2011  . Vertigo    2x in last yr    Past Surgical History:  Procedure Laterality Date  . ABDOMINAL HYSTERECTOMY  2012   cervical dysplasia/ovaries remian  . CHOLECYSTECTOMY    . COLONOSCOPY WITH PROPOFOL N/A 06/19/2015   Procedure: COLONOSCOPY WITH PROPOFOL;  Surgeon: [name], MD;  Location: [facility];  Service: Endoscopy;  Laterality: N/A;  Diabetic - oral meds   . DENTAL SURGERY     . ETHMOIDECTOMY Bilateral 04/17/2018   Procedure: ETHMOIDECTOMY;  Surgeon: [name], MD;  Location: [facility];  Service: ENT;  Laterality: Bilateral;  . GALLBLADDER SURGERY    . IMAGE GUIDED SINUS SURGERY N/A 04/17/2018   Procedure: IMAGE GUIDED SINUS SURGERY;  Surgeon: [name], MD;  Location: [facility];  Service: ENT;  Laterality: N/A;  . MAXILLARY ANTROSTOMY Bilateral 04/17/2018   Procedure: MAXILLARY ANTROSTOMY;  Surgeon: [name], MD;  Location: [facility];  Service: ENT;  Laterality: Bilateral;  . NASAL SEPTOPLASTY W/ TURBINOPLASTY Bilateral 04/17/2018   Procedure: NASAL SEPTOPLASTY WITH TURBINATE REDUCTION;  Surgeon: [name], MD;  Location: [facility];  Service: ENT;  Laterality: Bilateral;  . TRANSURETHRAL RESECTION OF BLADDER TUMOR N/A 04/17/2018   Procedure: NTRANSURETHRAL RESECTION OF BLADDER TUMOR (TURBT) WITH GEMCITABINE;  Surgeon: [name], MD;  Location: [facility];  Service: Urology;  Laterality: N/A;    Family History  Problem Relation Age of Onset  . Diabetes Maternal Grandmother   . Hypertension Maternal Grandmother   . Hyperlipidemia Maternal Grandmother   . Hypothyroidism Maternal Grandmother   . Heart disease Maternal Grandmother   . Diabetes Mother   . Hypertension Mother   . Hypothyroidism Mother   . Bipolar disorder Mother   . Heart disease Mother   . Breast cancer Neg Hx     Social History:  reports that she quit smoking about 16 years ago. Her smoking use included cigarettes. She has a 10.00 pack-year smoking history. She has never used smokeless tobacco. She reports current alcohol use. She reports that she does not use drugs. She quit smoking in 2004. She denies any tobacco use since. She has had one drink in the past 6 months. She is married. She works in [employer]. The patient is alone today.  Allergies:  Allergies  Allergen Reactions  . Hctz [Hydrochlorothiazide] Cough  . Lisinopril Cough  . Latex Rash    Condoms only  .  Prednisone Anxiety    Paranoia    Current Medications: Current Outpatient Medications  Medication Sig Dispense Refill  . albuterol (VENTOLIN HFA) 108 (90 Base) MCG/ACT inhaler Inhale 2 puffs into the lungs every 4 (four) hours as needed for wheezing or shortness of breath. 1 Inhaler 1  . Ascorbic Acid (VITAMIN C) 1000 MG tablet Take 1,000 mg by mouth daily.    . Cholecalciferol 5000 units capsule Take 1 capsule (5,000 Units total) by mouth daily.    . cyclobenzaprine (FEXMID) 7.5 MG tablet Take 7.5 mg by mouth 2 (two) times daily as needed for muscle spasms.     . Dulaglutide 1.5 MG/0.5ML SOPN Inject 1.5 mg into the skin once a week.    . famotidine (PEPCID) 20 MG tablet Take 1 tablet (20 mg total) by mouth 2 (two) times daily. 180 tablet 1  . glucose blood (CONTOUR NEXT TEST) test strip Use to check BS up  to 3 times daily for Diabetes DX: E11.9 100 each 12  . ibuprofen (ADVIL,MOTRIN) 200 MG tablet Take 400 mg by mouth every 8 (eight) hours as needed for moderate pain.    Marland Kitchen lamoTRIgine (LAMICTAL) 100 MG tablet Take 300 mg by mouth every morning.     Marland Kitchen levothyroxine (SYNTHROID, LEVOTHROID) 175 MCG tablet TAKE 1 TABLET (175 MCG TOTAL) BY MOUTH DAILY. 30 tablet 12  . losartan (COZAAR) 100 MG tablet Take 0.5 tablets (50 mg total) by mouth 2 (two) times daily. 90 tablet 3  . metFORMIN (GLUCOPHAGE) 500 MG tablet Take 1 tablet (500 mg total) by mouth 3 (three) times daily. (Patient taking differently: Take 1,000 mg by mouth 2 (two) times daily with a meal. ) 270 tablet 0  . Multiple Vitamin (MULTIVITAMIN) tablet Take 1 tablet by mouth daily.    . Omega-3 Fatty Acids (FISH OIL) 1200 MG CAPS Take 1,200 mg by mouth daily.     Marland Kitchen triamcinolone (NASACORT ALLERGY 24HR) 55 MCG/ACT AERO nasal inhaler Place 2 sprays into the nose daily.    . diazepam (VALIUM) 2 MG tablet take 30 min prior to MRI, may take 1 additional dose if needed. Must have driver.     No current facility-administered medications for this  visit.     Review of Systems  Constitutional: Positive for weight loss (15 lbs after starting Trulicity). Negative for chills, diaphoresis, fever and malaise/fatigue.  HENT: Negative.  Negative for congestion, ear pain, hearing loss, nosebleeds, sinus pain, sore throat and tinnitus.   Eyes: Negative for blurred vision, double vision and photophobia.       Eyes "feel swollen."  Respiratory: Negative.  Negative for cough, hemoptysis, sputum production and shortness of breath.   Cardiovascular: Negative.  Negative for chest pain, palpitations, orthopnea and claudication.  Gastrointestinal: Positive for diarrhea (daily). Negative for abdominal pain, constipation, melena, nausea and vomiting. Blood in stool: resolved.  Genitourinary: Negative for dysuria, frequency and urgency. Hematuria: resolved.  Musculoskeletal: Positive for back pain (low to mid), falls, joint pain (legs) and myalgias (legs).  Skin: Positive for itching (hands, after touching something). Negative for rash.  Neurological: Positive for tingling (hands), sensory change (hands) and weakness (occasionally in legs, leads to falls). Negative for dizziness and headaches.  Endo/Heme/Allergies: Does not bruise/bleed easily.       Diabetes.  Psychiatric/Behavioral: Positive for memory loss. Negative for depression. The patient is not nervous/anxious and does not have insomnia.   All other systems reviewed and are negative.  Performance status (ECOG):  1  Physical Exam  Constitutional: She is oriented to person, place, and time. She appears well-developed and well-nourished. No distress.  HENT:  Head: Normocephalic and atraumatic.  Mouth/Throat: Oropharynx is clear and moist. No oropharyngeal exudate.  Shoulder length brown hair.  Wearing a mask.  Eyes: Pupils are equal, round, and reactive to light. Conjunctivae and EOM are normal. No scleral icterus.  Dark rimmed glasses.  Right eye brown.  Left eye blue.  Neck: Normal range of  motion. Neck supple. No JVD present.  Cardiovascular: Normal rate, regular rhythm and normal heart sounds. Exam reveals no gallop.  No murmur heard. Pulmonary/Chest: Effort normal and breath sounds normal. No respiratory distress. She has no wheezes. She has no rales.  Abdominal: Soft. Bowel sounds are normal. She exhibits no distension and no mass. There is abdominal tenderness in the left lower quadrant. There is no rebound and no guarding.  Musculoskeletal: Normal range of motion.  General: Tenderness (bilateral ankles, groin) present. No edema.  Lymphadenopathy:    She has no cervical adenopathy.    She has no axillary adenopathy.       Right: No supraclavicular adenopathy present.       Left: No supraclavicular adenopathy present.  Neurological: She is alert and oriented to person, place, and time.  Skin: Skin is warm and dry. No rash noted. She is not diaphoretic. No erythema. No pallor.  Psychiatric: She has a normal mood and affect. Her behavior is normal. Judgment and thought content normal.  Nursing note and vitals reviewed.   No visits with results within 3 Day(s) from this visit.  Latest known visit with results is:  Office Visit on 10/06/2018  Component Date Value Ref Range Status  . Color, UA 10/06/2018 gold   Final  . Clarity, UA 10/06/2018 clear   Final  . Glucose, UA 10/06/2018 Negative  Negative Final  . Bilirubin, UA 10/06/2018 neg   Final  . Ketones, UA 10/06/2018 neg   Final  . Spec Grav, UA 10/06/2018 1.015  1.010 - 1.025 Final  . Blood, UA 10/06/2018 neg   Final  . pH, UA 10/06/2018 5.0  5.0 - 8.0 Final  . Protein, UA 10/06/2018 Positive* Negative Final  . Urobilinogen, UA 10/06/2018 0.2  0.2 or 1.0 E.U./dL Final  . Nitrite, UA 10/06/2018 neg   Final  . Leukocytes, UA 10/06/2018 Negative  Negative Final  . Appearance 10/06/2018 clear   Final  . Odor 10/06/2018 none   Final    Assessment:  KEIRRA ZEIMET is a 48 y.o. female with a mildly elevated  ferritin.  Etiology is felt reactive.  She denies any family history of hemochromatosis.  Hematuria work-up in 04/2018 with findings positive for bilateral angiomyolipomas.  Cystoscopy on 04/17/2018 revealed a 1 cm spherical papillary tumor on a narrow stalk on the anterior bladder wall which was resected.  Bladder biopsy on 04/17/2018 revealed a nodular area of cystitis cystica with no atypia or malignancy.   Bladder trigone biopsy revealed non-keratinizing squamous metaplasia with cystitis cystica with focal calcification.  There was no atypia or malignancy.  She received intravesical gemcitabine.  She has a history of elevated liver function studies.  LFTs on 10/31/2018 included an AST 53 and ALT 102.  Symptomatically, she notes a 6-7 month history of memory changes and intermittent confusion, change in sensation when touching certain things, falls, and mid back and lower leg pain.  Plan: 1.   Labs today:  CBC with diff, CMP, ferritin, iron studies, sed rate, CRP. 2.   Elevated ferritin  Etiology is felt reactive.  Ferritin is slightly high.  She is s/p hysterectomy in 2011.  Discuss work-up. 3.   Elevated liver function tests  Etiology is unclear.  Patient denies any alcohol use.  She denies any prior history of transfusion or hepatitis.  Discuss checking hepatitis serologies. 4.   RTC in 1 week for MD assessment ([place]ox[facility]) and review of work-up.  I discussed the assessment and treatment plan with the patient.  The patient was provided an opportunity to ask questions and all were answered.  The patient agreed with the plan and demonstrated an understanding of the instructions.  The patient was advised to call back if the symptoms worsen or if the condition fails to improve as anticipated.  I provided 25 minutes of face-to-face time during this this encounter and > 50% was spent counseling as documented under my assessment and plan.  Melissa C. Mike Gip, MD, PhD    12/20/2018, 2:00 PM   I, Molly Dorshimer, am acting as Education administrator for [employer]. Mike Gip, MD, PhD.  I, Melissa C. Mike Gip, MD, have reviewed the above documentation for accuracy and completeness, and I agree with the above.

## 2018-12-20 ENCOUNTER — Inpatient Hospital Stay: Payer: BC Managed Care – PPO | Attending: Hematology and Oncology | Admitting: Hematology and Oncology

## 2018-12-20 ENCOUNTER — Inpatient Hospital Stay: Payer: BC Managed Care – PPO

## 2018-12-20 ENCOUNTER — Encounter: Payer: Self-pay | Admitting: Hematology and Oncology

## 2018-12-20 VITALS — BP 155/105 | HR 84 | Temp 98.7°F | Resp 18 | Ht 66.5 in | Wt 284.7 lb

## 2018-12-20 DIAGNOSIS — G4730 Sleep apnea, unspecified: Secondary | ICD-10-CM | POA: Diagnosis not present

## 2018-12-20 DIAGNOSIS — Z794 Long term (current) use of insulin: Secondary | ICD-10-CM | POA: Insufficient documentation

## 2018-12-20 DIAGNOSIS — M79662 Pain in left lower leg: Secondary | ICD-10-CM | POA: Diagnosis not present

## 2018-12-20 DIAGNOSIS — R7989 Other specified abnormal findings of blood chemistry: Secondary | ICD-10-CM | POA: Insufficient documentation

## 2018-12-20 DIAGNOSIS — M79644 Pain in right finger(s): Secondary | ICD-10-CM

## 2018-12-20 DIAGNOSIS — M79661 Pain in right lower leg: Secondary | ICD-10-CM | POA: Diagnosis not present

## 2018-12-20 DIAGNOSIS — M545 Low back pain: Secondary | ICD-10-CM | POA: Diagnosis not present

## 2018-12-20 DIAGNOSIS — Z90710 Acquired absence of both cervix and uterus: Secondary | ICD-10-CM

## 2018-12-20 DIAGNOSIS — E119 Type 2 diabetes mellitus without complications: Secondary | ICD-10-CM | POA: Diagnosis not present

## 2018-12-20 LAB — CBC WITH DIFFERENTIAL/PLATELET
Abs Immature Granulocytes: 0.12 10*3/uL — ABNORMAL HIGH (ref 0.00–0.07)
Basophils Absolute: 0.1 10*3/uL (ref 0.0–0.1)
Basophils Relative: 1 %
Eosinophils Absolute: 0.2 10*3/uL (ref 0.0–0.5)
Eosinophils Relative: 2 %
HCT: 41.4 % (ref 36.0–46.0)
Hemoglobin: 13.8 g/dL (ref 12.0–15.0)
Immature Granulocytes: 1 %
Lymphocytes Relative: 30 %
Lymphs Abs: 3.4 10*3/uL (ref 0.7–4.0)
MCH: 29.7 pg (ref 26.0–34.0)
MCHC: 33.3 g/dL (ref 30.0–36.0)
MCV: 89.2 fL (ref 80.0–100.0)
Monocytes Absolute: 0.7 10*3/uL (ref 0.1–1.0)
Monocytes Relative: 7 %
Neutro Abs: 6.7 10*3/uL (ref 1.7–7.7)
Neutrophils Relative %: 59 %
Platelets: 274 10*3/uL (ref 150–400)
RBC: 4.64 MIL/uL (ref 3.87–5.11)
RDW: 14.3 % (ref 11.5–15.5)
WBC: 11.2 10*3/uL — ABNORMAL HIGH (ref 4.0–10.5)
nRBC: 0 % (ref 0.0–0.2)

## 2018-12-20 LAB — COMPREHENSIVE METABOLIC PANEL
ALT: 118 U/L — ABNORMAL HIGH (ref 0–44)
AST: 67 U/L — ABNORMAL HIGH (ref 15–41)
Albumin: 3.9 g/dL (ref 3.5–5.0)
Alkaline Phosphatase: 93 U/L (ref 38–126)
Anion gap: 10 (ref 5–15)
BUN: 10 mg/dL (ref 6–20)
CO2: 24 mmol/L (ref 22–32)
Calcium: 9 mg/dL (ref 8.9–10.3)
Chloride: 103 mmol/L (ref 98–111)
Creatinine, Ser: 0.87 mg/dL (ref 0.44–1.00)
GFR calc Af Amer: >60 mL/min (ref >60)
GFR calc non Af Amer: >60 mL/min (ref >60)
Glucose, Bld: 142 mg/dL — ABNORMAL HIGH (ref 70–99)
Potassium: 3.9 mmol/L (ref 3.5–5.1)
Sodium: 137 mmol/L (ref 135–145)
Total Bilirubin: 0.4 mg/dL (ref 0.3–1.2)
Total Protein: 7.4 g/dL (ref 6.5–8.1)

## 2018-12-20 LAB — IRON AND TIBC
Iron: 57 ug/dL (ref 28–170)
Saturation Ratios: 15 % (ref 10.4–31.8)
TIBC: 388 ug/dL (ref 250–450)
UIBC: 331 ug/dL

## 2018-12-20 LAB — FERRITIN: Ferritin: 122 ng/mL (ref 11–307)

## 2018-12-20 LAB — SEDIMENTATION RATE: Sed Rate: 14 mm/hr (ref 0–20)

## 2018-12-21 DIAGNOSIS — G4733 Obstructive sleep apnea (adult) (pediatric): Secondary | ICD-10-CM | POA: Diagnosis not present

## 2018-12-21 LAB — C-REACTIVE PROTEIN: CRP: <0.8 mg/dL (ref <1.0)

## 2018-12-21 LAB — HEPATITIS B CORE ANTIBODY, TOTAL: Hep B Core Total Ab: NEGATIVE

## 2018-12-21 LAB — HEPATITIS B SURFACE ANTIGEN: Hepatitis B Surface Ag: NEGATIVE

## 2018-12-21 LAB — HEPATITIS C ANTIBODY: HCV Ab: <0.1 s/co ratio (ref 0.0–0.9)

## 2018-12-25 ENCOUNTER — Other Ambulatory Visit: Payer: Self-pay

## 2018-12-25 NOTE — Progress Notes (Signed)
[facility]  [address], [address] [place], [place] [address] Phone: [phone]  Fax: [phone]   Telemedicine Office Visit:  12/26/2018  Referring physician: [name], MD  I connected with Lillia Abed on 12/26/2018 at 10:54 AM by videoconferencing and verified that I was speaking with the correct person using 2 identifiers.  The patient was at work.  I discussed the limitations, risk, security and privacy concerns of performing an evaluation and management service by videoconferencing and the availability of in person appointments.  I also discussed with the patient that there may be a patient responsible charge related to this service.  The patient expressed understanding and agreed to proceed.   Chief Complaint: Lori Knight is a 48 y.o. female with mildly elevated ferritin who is seen for 1 week assessment, review of work-up, and discussion regarding direction of therapy.   HPI: The patient was last seen in the hematology clinic on 12/20/2018. At that time, she noted a 6-7 month history of memory changes and intermittent confusion, change in sensation when touching certain things, falls, and mid back and lower leg pain.  She was also noted to have increased liver function tests.  Work-up revealed hematocrit of 41.4, hemoglobin 13.8, MCV 89.2, platelets 274,000, WBC 11,200 (ANC 6700).  Ferritin was 122 (normal) with an iron saturation was 15%.  CRP and sed rate were normal.  Hepatitis B core antibody total, hepatitis B surface antigen, hepatitis C antibody were negative.  AST was 67 and ALT 118.  During the interim, the patient is doing well. She denies any new symptoms, noting just that she feels "blah."    Past Medical History:  Diagnosis Date   Adult hypothyroidism 06/21/2012   Bilateral polycystic ovarian syndrome 06/21/2012   Biliary calculus with cholecystitis 08/14/2012   Bipolar 1 disorder (HCC)    BP (high blood pressure)  02/14/2014   Bronchitis    Diabetes mellitus ([place]) 08/14/2012   Diabetes mellitus without complication ([place])    pre-diabetic   Dysrhythmia    wore heart monitor 2016. Corrected by changing Levothyroxine dose.   Heart murmur    followed by PCP-AS A CHILD-ASYMPTOMATIC   Hyperprolactinemia (HCC) 06/21/2012   Hypertension    Motion sickness    carnival rides   PONV (postoperative nausea and vomiting)    WAKES UP CRYING   Shortness of breath dyspnea    stairs. related to wt.   Sleep apnea    has CPAP. has not used since 2011   Vertigo    2x in last yr    Past Surgical History:  Procedure Laterality Date   ABDOMINAL HYSTERECTOMY  2012   cervical dysplasia/ovaries remian   CHOLECYSTECTOMY     COLONOSCOPY WITH PROPOFOL N/A 06/19/2015   Procedure: COLONOSCOPY WITH PROPOFOL;  Surgeon: [name], MD;  Location: [facility];  Service: Endoscopy;  Laterality: N/A;  Diabetic - oral meds    DENTAL SURGERY     ETHMOIDECTOMY Bilateral 04/17/2018   Procedure: ETHMOIDECTOMY;  Surgeon: [name], MD;  Location: [facility];  Service: ENT;  Laterality: Bilateral;   GALLBLADDER SURGERY     IMAGE GUIDED SINUS SURGERY N/A 04/17/2018   Procedure: IMAGE GUIDED SINUS SURGERY;  Surgeon: [name], MD;  Location: [facility];  Service: ENT;  Laterality: N/A;   MAXILLARY ANTROSTOMY Bilateral 04/17/2018   Procedure: MAXILLARY ANTROSTOMY;  Surgeon: [name], MD;  Location: [facility];  Service: ENT;  Laterality: Bilateral;   NASAL SEPTOPLASTY W/  TURBINOPLASTY Bilateral 04/17/2018   Procedure: NASAL SEPTOPLASTY WITH TURBINATE REDUCTION;  Surgeon: [name], MD;  Location: [facility];  Service: ENT;  Laterality: Bilateral;   TRANSURETHRAL RESECTION OF BLADDER TUMOR N/A 04/17/2018   Procedure: NTRANSURETHRAL RESECTION OF BLADDER TUMOR (TURBT) WITH GEMCITABINE;  Surgeon: [name], MD;  Location: [facility];  Service: Urology;  Laterality: N/A;    Family History  Problem  Relation Age of Onset   Diabetes Maternal Grandmother    Hypertension Maternal Grandmother    Hyperlipidemia Maternal Grandmother    Hypothyroidism Maternal Grandmother    Heart disease Maternal Grandmother    Diabetes Mother    Hypertension Mother    Hypothyroidism Mother    Bipolar disorder Mother    Heart disease Mother    Breast cancer Neg Hx     Social History:  reports that she quit smoking about 16 years ago. Her smoking use included cigarettes. She has a 10.00 pack-year smoking history. She has never used smokeless tobacco. She reports current alcohol use. She reports that she does not use drugs. She quit smoking in 2004. She denies any tobacco use since. She has had one drink in the past 6 months. She is married. She works in [employer]. The patient is alone today.  Participants in the patient's visit and their role in the encounter included the patient and Waymon Budge, RN today.  The intake visit was provided by Waymon Budge, RN.  Allergies:  Allergies  Allergen Reactions   Hctz [Hydrochlorothiazide] Cough   Lisinopril Cough   Latex Rash    Condoms only   Prednisone Anxiety    Paranoia    Current Medications: Current Outpatient Medications  Medication Sig Dispense Refill   albuterol (VENTOLIN HFA) 108 (90 Base) MCG/ACT inhaler Inhale 2 puffs into the lungs every 4 (four) hours as needed for wheezing or shortness of breath. 1 Inhaler 1   Ascorbic Acid (VITAMIN C) 1000 MG tablet Take 1,000 mg by mouth daily.     Cholecalciferol 5000 units capsule Take 1 capsule (5,000 Units total) by mouth daily.     cyclobenzaprine (FEXMID) 7.5 MG tablet Take 7.5 mg by mouth 2 (two) times daily as needed for muscle spasms.      Dulaglutide 1.5 MG/0.5ML SOPN Inject 1.5 mg into the skin once a week.     famotidine (PEPCID) 20 MG tablet Take 1 tablet (20 mg total) by mouth 2 (two) times daily. 180 tablet 1   glucose blood (CONTOUR NEXT TEST) test  strip Use to check BS up to 3 times daily for Diabetes DX: E11.9 100 each 12   ibuprofen (ADVIL,MOTRIN) 200 MG tablet Take 400 mg by mouth every 8 (eight) hours as needed for moderate pain.     lamoTRIgine (LAMICTAL) 100 MG tablet Take 300 mg by mouth every morning.      levothyroxine (SYNTHROID, LEVOTHROID) 175 MCG tablet TAKE 1 TABLET (175 MCG TOTAL) BY MOUTH DAILY. 30 tablet 12   losartan (COZAAR) 100 MG tablet Take 0.5 tablets (50 mg total) by mouth 2 (two) times daily. 90 tablet 3   metFORMIN (GLUCOPHAGE) 500 MG tablet Take 1 tablet (500 mg total) by mouth 3 (three) times daily. (Patient taking differently: Take 1,000 mg by mouth 2 (two) times daily with a meal. ) 270 tablet 0   Multiple Vitamin (MULTIVITAMIN) tablet Take 1 tablet by mouth daily.     Omega-3 Fatty Acids (FISH OIL) 1200 MG CAPS Take 1,200 mg by mouth daily.  triamcinolone (NASACORT ALLERGY 24HR) 55 MCG/ACT AERO nasal inhaler Place 2 sprays into the nose daily.     diazepam (VALIUM) 2 MG tablet take 30 min prior to MRI, may take 1 additional dose if needed. Must have driver.     No current facility-administered medications for this visit.     Review of Systems  Constitutional: Positive for weight loss (15 lbs after starting Trulicity). Negative for chills, diaphoresis, fever and malaise/fatigue.       Feels "blah."  HENT: Negative.  Negative for congestion, ear pain, hearing loss, nosebleeds, sinus pain, sore throat and tinnitus.   Eyes: Negative for blurred vision, double vision and photophobia.       Eyes "feel swollen."  Respiratory: Negative.  Negative for cough, hemoptysis, sputum production and shortness of breath.   Cardiovascular: Negative.  Negative for chest pain, palpitations, orthopnea and claudication.  Gastrointestinal: Positive for diarrhea (daily). Negative for abdominal pain, blood in stool, constipation, melena, nausea and vomiting.  Genitourinary: Negative for dysuria, frequency, hematuria  and urgency.  Musculoskeletal: Positive for back pain (low to mid), falls, joint pain (legs) and myalgias (legs).  Skin: Positive for itching (hands, after touching something). Negative for rash.  Neurological: Positive for tingling (hands), sensory change (hands) and weakness (occasionally in legs, leads to falls). Negative for dizziness and headaches.  Endo/Heme/Allergies: Does not bruise/bleed easily.       Diabetes.  Psychiatric/Behavioral: Positive for memory loss. Negative for depression. The patient is not nervous/anxious and does not have insomnia.   All other systems reviewed and are negative.   Performance status (ECOG): 1  Physical Exam  Constitutional: She is oriented to person, place, and time. She appears well-developed and well-nourished. No distress.  HENT:  Head: Normocephalic and atraumatic.  Shoulder length brown hair.  Wearing a mask.  Eyes: Conjunctivae and EOM are normal. No scleral icterus.  Dark rimmed glasses.  Right eye brown.  Left eye blue.  Neurological: She is alert and oriented to person, place, and time.  Skin: No pallor.  Psychiatric: She has a normal mood and affect. Her behavior is normal. Judgment and thought content normal.  Nursing note reviewed.   No visits with results within 3 Day(s) from this visit.  Latest known visit with results is:  Appointment on 12/20/2018  Component Date Value Ref Range Status   HCV Ab 12/20/2018 <0.1  0.0 - 0.9 s/co ratio Final   Comment: (NOTE)                                  Negative:     < 0.8                             Indeterminate: 0.8 - 0.9                                  Positive:     > 0.9 The CDC recommends that a positive HCV antibody result be followed up with a HCV Nucleic Acid Amplification test (267124). Performed At: [facility] [place], [place] [id number] Rush Farmer MD [id number]    Hepatitis B Surface Ag 12/20/2018 Negative  Negative Final   Comment:  (NOTE) Performed At: [facility] [place], [place] [id number] Rush Farmer MD [id number]  Hep B Core Total Ab 12/20/2018 Negative  Negative Final   Comment: (NOTE) Performed At: [facility] [place], [place] [id number] Rush Farmer MD [id number]    CRP 12/20/2018 <0.8  <1.0 mg/dL Final   Performed at [facility], [place] [address], [place], [place] [address]   Sed Rate 12/20/2018 14  0 - 20 mm/hr Final   Performed at [facility], [address] [place], [place] [address]   Iron 12/20/2018 57  28 - 170 ug/dL Final   TIBC 12/20/2018 388  250 - 450 ug/dL Final   Saturation Ratios 12/20/2018 15  10.4 - 31.8 % Final   UIBC 12/20/2018 331  ug/dL Final   Performed at [facility], [address] [place], [place] [address]   Ferritin 12/20/2018 122  11 - 307 ng/mL Final   Performed at [facility], [address] [place], [place] [address]   Sodium 12/20/2018 137  135 - 145 mmol/L Final   Potassium 12/20/2018 3.9  3.5 - 5.1 mmol/L Final   Chloride 12/20/2018 103  98 - 111 mmol/L Final   CO2 12/20/2018 24  22 - 32 mmol/L Final   Glucose, Bld 12/20/2018 142* 70 - 99 mg/dL Final   BUN 12/20/2018 10  6 - 20 mg/dL Final   Creatinine, Ser 12/20/2018 0.87  0.44 - 1.00 mg/dL Final   Calcium 12/20/2018 9.0  8.9 - 10.3 mg/dL Final   Total Protein 12/20/2018 7.4  6.5 - 8.1 g/dL Final   Albumin 12/20/2018 3.9  3.5 - 5.0 g/dL Final   AST 12/20/2018 67* 15 - 41 U/L Final   ALT 12/20/2018 118* 0 - 44 U/L Final   Alkaline Phosphatase 12/20/2018 93  38 - 126 U/L Final   Total Bilirubin 12/20/2018 0.4  0.3 - 1.2 mg/dL Final   GFR calc non Af Amer 12/20/2018 >60  >60 mL/min Final   GFR calc Af Amer 12/20/2018 >60  >60 mL/min Final   Anion gap 12/20/2018 10  5 - 15 Final   Performed at [facility], [address], [place], [place] [address]   WBC 12/20/2018 11.2* 4.0  - 10.5 K/uL Final   RBC 12/20/2018 4.64  3.87 - 5.11 MIL/uL Final   Hemoglobin 12/20/2018 13.8  12.0 - 15.0 g/dL Final   HCT 12/20/2018 41.4  36.0 - 46.0 % Final   MCV 12/20/2018 89.2  80.0 - 100.0 fL Final   MCH 12/20/2018 29.7  26.0 - 34.0 pg Final   MCHC 12/20/2018 33.3  30.0 - 36.0 g/dL Final   RDW 12/20/2018 14.3  11.5 - 15.5 % Final   Platelets 12/20/2018 274  150 - 400 K/uL Final   nRBC 12/20/2018 0.0  0.0 - 0.2 % Final   Neutrophils Relative % 12/20/2018 59  % Final   Neutro Abs 12/20/2018 6.7  1.7 - 7.7 K/uL Final   Lymphocytes Relative 12/20/2018 30  % Final   Lymphs Abs 12/20/2018 3.4  0.7 - 4.0 K/uL Final   Monocytes Relative 12/20/2018 7  % Final   Monocytes Absolute 12/20/2018 0.7  0.1 - 1.0 K/uL Final   Eosinophils Relative 12/20/2018 2  % Final   Eosinophils Absolute 12/20/2018 0.2  0.0 - 0.5 K/uL Final   Basophils Relative 12/20/2018 1  % Final   Basophils Absolute 12/20/2018 0.1  0.0 - 0.1 K/uL Final   Immature Granulocytes 12/20/2018 1  % Final   Abs Immature Granulocytes 12/20/2018  0.12* 0.00 - 0.07 K/uL Final   Performed at [facility], [address], [place], [place] [address]    Assessment:  Lori Knight is a 48 y.o. female with a mildly elevated ferritin.  Etiology is felt reactive.  She denies any family history of hemochromatosis.  Hematuria work-up in 04/2018 with findings positive for bilateral angiomyolipomas.  Cystoscopy on 04/17/2018 revealed a 1 cm spherical papillary tumor on a narrow stalk on the anterior bladder wall which was resected.  Bladder biopsy on 04/17/2018 revealed a nodular area of cystitis cystica with no atypia or malignancy.   Bladder trigone biopsy revealed non-keratinizing squamous metaplasia with cystitis cystica with focal calcification.  There was no atypia or malignancy.  She received intravesical gemcitabine.  Work-up on 12/20/2018 revealed hematocrit of 41.4, hemoglobin 13.8, MCV 89.2,  platelets 274,000, WBC 11,200 (ANC 6700).  Ferritin was 122 (normal) with an iron saturation was 15%.  CRP and sed rate were normal.  Hepatitis B core antibody total, hepatitis B surface antigen, hepatitis C antibody were negative.  AST was 67 and ALT 118.  She has a history of elevated liver function studies.  LFTs on 10/31/2018 included an AST 53 and ALT 102.  Symptomatically, she feels "blah".  Initial exam revealed no adenopathy or hepatosplenonegaly.  Plan: 1.   Review work-up from 12/20/2018. 2.   Elevated ferritin             Ferritin was 209 on 12/08/2018.  Ferritin was 122 (normal) on 12/20/2018.  Initial mildly elevated ferritin was felt reactive.               No intervention required. 3.   Elevated liver function tests             AST 53 and ALT 102 on 10/31/2018.  AST 67 and ALT 118 on 12/20/2018.              Hepatitis B and C testing were negative.    Patient denies any new medication or alcohol use .             Discuss further follow-up by Dr. Army Melia and possible GI consultation 4.   RTC prn.  I discussed the assessment and treatment plan with the patient.  The patient was provided an opportunity to ask questions and all were answered.  The patient agreed with the plan and demonstrated an understanding of the instructions.  The patient was advised to call back or seek an in person evaluation if the symptoms worsen or if the condition fails to improve as anticipated.  I provided 7 minutes (10:54 AM - 11:00 AM) of face-to-face video visit time during this this encounter and > 50% was spent counseling as documented under my assessment and plan.  I provided these services from the [facility] office.   Nolon Stalls, MD, PhD  12/26/2018, 10:54 AM  I, Molly Dorshimer, am acting as Education administrator for [employer]. Mike Gip, MD, PhD.  I, Deaisha Welborn C. Mike Gip, MD, have reviewed the above documentation for accuracy and completeness, and I agree with the above.

## 2018-12-26 ENCOUNTER — Encounter: Payer: Self-pay | Admitting: Hematology and Oncology

## 2018-12-26 ENCOUNTER — Inpatient Hospital Stay (HOSPITAL_BASED_OUTPATIENT_CLINIC_OR_DEPARTMENT_OTHER): Payer: BC Managed Care – PPO | Admitting: Hematology and Oncology

## 2018-12-26 DIAGNOSIS — R7989 Other specified abnormal findings of blood chemistry: Secondary | ICD-10-CM

## 2018-12-26 DIAGNOSIS — R945 Abnormal results of liver function studies: Secondary | ICD-10-CM

## 2018-12-26 NOTE — Progress Notes (Signed)
Confirmed Name, DOB, and Address. Denies any concerns.  

## 2019-01-01 ENCOUNTER — Other Ambulatory Visit: Payer: Self-pay

## 2019-01-01 ENCOUNTER — Ambulatory Visit
Admission: RE | Admit: 2019-01-01 | Discharge: 2019-01-01 | Disposition: A | Discharge status: Home / Self Care | Payer: BC Managed Care – PPO | Source: Ambulatory Visit | Attending: Neurology | Admitting: Neurology

## 2019-01-01 ENCOUNTER — Ambulatory Visit: Payer: Self-pay

## 2019-01-01 DIAGNOSIS — Z9889 Other specified postprocedural states: Secondary | ICD-10-CM | POA: Diagnosis not present

## 2019-01-01 DIAGNOSIS — D320 Benign neoplasm of cerebral meninges: Secondary | ICD-10-CM | POA: Diagnosis not present

## 2019-01-01 DIAGNOSIS — I639 Cerebral infarction, unspecified: Secondary | ICD-10-CM | POA: Diagnosis not present

## 2019-01-08 ENCOUNTER — Other Ambulatory Visit: Payer: Self-pay | Admitting: Neurology

## 2019-01-08 DIAGNOSIS — D329 Benign neoplasm of meninges, unspecified: Secondary | ICD-10-CM

## 2019-01-09 ENCOUNTER — Ambulatory Visit
Admission: RE | Admit: 2019-01-09 | Discharge: 2019-01-09 | Disposition: A | Discharge status: Home / Self Care | Payer: BLUE CROSS/BLUE SHIELD | Source: Ambulatory Visit | Attending: Neurology | Admitting: Neurology

## 2019-01-09 ENCOUNTER — Other Ambulatory Visit: Payer: Self-pay

## 2019-01-09 DIAGNOSIS — D320 Benign neoplasm of cerebral meninges: Secondary | ICD-10-CM | POA: Diagnosis not present

## 2019-01-09 DIAGNOSIS — D329 Benign neoplasm of meninges, unspecified: Secondary | ICD-10-CM | POA: Diagnosis not present

## 2019-01-09 MED ORDER — GADOBUTROL 1 MMOL/ML IV SOLN
10.0000 mL | Freq: Once | INTRAVENOUS | Status: AC | PRN
  Administered 2019-01-09: 10 mL via INTRAVENOUS

## 2019-01-15 DIAGNOSIS — D329 Benign neoplasm of meninges, unspecified: Secondary | ICD-10-CM | POA: Diagnosis not present

## 2019-01-20 DIAGNOSIS — G4733 Obstructive sleep apnea (adult) (pediatric): Secondary | ICD-10-CM | POA: Diagnosis not present

## 2019-01-24 DIAGNOSIS — R413 Other amnesia: Secondary | ICD-10-CM | POA: Diagnosis not present

## 2019-01-24 DIAGNOSIS — R42 Dizziness and giddiness: Secondary | ICD-10-CM | POA: Diagnosis not present

## 2019-02-07 ENCOUNTER — Encounter: Payer: Self-pay | Admitting: Internal Medicine

## 2019-02-19 DIAGNOSIS — M545 Low back pain: Secondary | ICD-10-CM | POA: Diagnosis not present

## 2019-02-19 DIAGNOSIS — M79641 Pain in right hand: Secondary | ICD-10-CM | POA: Diagnosis not present

## 2019-02-19 DIAGNOSIS — R202 Paresthesia of skin: Secondary | ICD-10-CM | POA: Diagnosis not present

## 2019-02-19 DIAGNOSIS — R413 Other amnesia: Secondary | ICD-10-CM | POA: Diagnosis not present

## 2019-02-19 DIAGNOSIS — R200 Anesthesia of skin: Secondary | ICD-10-CM | POA: Diagnosis not present

## 2019-02-20 DIAGNOSIS — G4733 Obstructive sleep apnea (adult) (pediatric): Secondary | ICD-10-CM | POA: Diagnosis not present

## 2019-03-15 DIAGNOSIS — F319 Bipolar disorder, unspecified: Secondary | ICD-10-CM | POA: Diagnosis not present

## 2019-03-22 DIAGNOSIS — F319 Bipolar disorder, unspecified: Secondary | ICD-10-CM | POA: Diagnosis not present

## 2019-03-23 DIAGNOSIS — G4733 Obstructive sleep apnea (adult) (pediatric): Secondary | ICD-10-CM | POA: Diagnosis not present

## 2019-03-23 DIAGNOSIS — R413 Other amnesia: Secondary | ICD-10-CM | POA: Diagnosis not present

## 2019-03-30 DIAGNOSIS — F319 Bipolar disorder, unspecified: Secondary | ICD-10-CM | POA: Diagnosis not present

## 2019-04-13 ENCOUNTER — Other Ambulatory Visit: Payer: Self-pay | Admitting: Family Medicine

## 2019-04-13 DIAGNOSIS — Z87448 Personal history of other diseases of urinary system: Secondary | ICD-10-CM

## 2019-04-13 NOTE — Progress Notes (Signed)
04/16/2019 9:21 AM   Lillia Abed 1971/06/17 [id number]  Referring provider: [name], MD [address] [place],  [place] [address]  Chief Complaint  Patient presents with  . Hematuria    HPI: Patient is a 48 -year-old female with a history of hematuria, bilateral angiomyolipomas, mixed incontinence and nocturia who presents today for follow up.  History of hematuria Former smoker.  CTU 03/2018 revealed normal adrenal glands.  No kidney stones identified. No hydronephrosis, hydroureter or ureterolithiasis. Anterior right lower pole angiomyolipoma measures 3.7 cm, image 49/2. Tiny angiomyolipoma within the left kidney measures 6 mm. No suspicious kidney mass. Urinary bladder appears normal.  Cystoscopy in 04/2018 with Dr. Erlene Quan noted a small 1 cm round papillary tumor at left dome of bladder highly concerning for bladder cancer.  She underwent a TURBT in 04/2018.  Lesion found not to be malignant.  She does not report any gross hematuria.  UA today was negative for microscopic hematuria.     Bilateral angiomyolipomas Found on 03/2018 CTU: 3.7 cm right lower pole angiomyolipoma and 6 mm left kidney angiomyolipoma.  RUS in 09/2018 revealed unchanged bilateral renal angiomyolipomas.  She is having bilateral flank pain.  It is mild.  Repeat RUS pending.  Mixed incontinence The patient is  experiencing urgency x 8 or more, frequency x 8 or more, not restricting fluids to avoid visits to the restroom, is engaging in toilet mapping, incontinence x 8 or more and nocturia x 4-7.   Her BP is 171/108.   Her PVR is 0 mL.  She is having frequency, dysuria (intermittent), nocturia and incontinence (wears depends - goes through two a day)  Nocturia Sleeping with CPAP machine      PMH: Past Medical History:  Diagnosis Date  . Adult hypothyroidism 06/21/2012  . Bilateral polycystic ovarian syndrome 06/21/2012  . Biliary calculus with cholecystitis 08/14/2012  . Bipolar 1  disorder ([place])   . BP (high blood pressure) 02/14/2014  . Bronchitis   . Diabetes mellitus ([place]) 08/14/2012  . Diabetes mellitus without complication ([place])    pre-diabetic  . Dysrhythmia    wore heart monitor 2016. Corrected by changing Levothyroxine dose.  Marland Kitchen Heart murmur    followed by PCP-AS A CHILD-ASYMPTOMATIC  . Hyperprolactinemia ([place]) 06/21/2012  . Hypertension   . Motion sickness    carnival rides  . PONV (postoperative nausea and vomiting)    WAKES UP CRYING  . Shortness of breath dyspnea    stairs. related to wt.  . Sleep apnea    has CPAP. has not used since 2011  . Vertigo    2x in last yr    Surgical History: Past Surgical History:  Procedure Laterality Date  . ABDOMINAL HYSTERECTOMY  2012   cervical dysplasia/ovaries remian  . CHOLECYSTECTOMY    . COLONOSCOPY WITH PROPOFOL N/A 06/19/2015   Procedure: COLONOSCOPY WITH PROPOFOL;  Surgeon: [name], MD;  Location: [facility];  Service: Endoscopy;  Laterality: N/A;  Diabetic - oral meds   . DENTAL SURGERY    . ETHMOIDECTOMY Bilateral 04/17/2018   Procedure: ETHMOIDECTOMY;  Surgeon: [name], MD;  Location: [facility];  Service: ENT;  Laterality: Bilateral;  . GALLBLADDER SURGERY    . IMAGE GUIDED SINUS SURGERY N/A 04/17/2018   Procedure: IMAGE GUIDED SINUS SURGERY;  Surgeon: [name], MD;  Location: [facility];  Service: ENT;  Laterality: N/A;  . MAXILLARY ANTROSTOMY Bilateral 04/17/2018   Procedure: MAXILLARY ANTROSTOMY;  Surgeon: [name],  MD;  Location: [facility];  Service: ENT;  Laterality: Bilateral;  . NASAL SEPTOPLASTY W/ TURBINOPLASTY Bilateral 04/17/2018   Procedure: NASAL SEPTOPLASTY WITH TURBINATE REDUCTION;  Surgeon: [name], MD;  Location: [facility];  Service: ENT;  Laterality: Bilateral;  . TRANSURETHRAL RESECTION OF BLADDER TUMOR N/A 04/17/2018   Procedure: NTRANSURETHRAL RESECTION OF BLADDER TUMOR (TURBT) WITH GEMCITABINE;  Surgeon: [name], MD;  Location: [facility];   Service: Urology;  Laterality: N/A;    Home Medications:  Allergies as of 04/16/2019      Reactions   Hctz [hydrochlorothiazide] Cough   Lisinopril Cough   Latex Rash   Condoms only   Prednisone Anxiety   Paranoia      Medication List       Accurate as of April 16, 2019  9:21 AM. If you have any questions, ask your nurse or doctor.        albuterol 108 (90 Base) MCG/ACT inhaler Commonly known as: VENTOLIN HFA Inhale 2 puffs into the lungs every 4 (four) hours as needed for wheezing or shortness of breath.   Cholecalciferol 125 MCG (5000 UT) capsule Take 1 capsule (5,000 Units total) by mouth daily.   cyclobenzaprine 7.5 MG tablet Commonly known as: FEXMID Take 7.5 mg by mouth 2 (two) times daily as needed for muscle spasms.   diazepam 2 MG tablet Commonly known as: VALIUM take 30 min prior to MRI, may take 1 additional dose if needed. Must have driver.   Dulaglutide 1.5 MG/0.5ML Sopn Inject 1.5 mg into the skin once a week.   famotidine 20 MG tablet Commonly known as: Pepcid Take 1 tablet (20 mg total) by mouth 2 (two) times daily.   Fish Oil 1200 MG Caps Take 1,200 mg by mouth daily.   glucose blood test strip Commonly known as: Contour Next Test Use to check BS up to 3 times daily for Diabetes DX: E11.9   ibuprofen 200 MG tablet Commonly known as: ADVIL Take 400 mg by mouth every 8 (eight) hours as needed for moderate pain.   lamoTRIgine 100 MG tablet Commonly known as: LAMICTAL Take 300 mg by mouth every morning.   levothyroxine 175 MCG tablet Commonly known as: SYNTHROID TAKE 1 TABLET (175 MCG TOTAL) BY MOUTH DAILY.   losartan 100 MG tablet Commonly known as: COZAAR Take 0.5 tablets (50 mg total) by mouth 2 (two) times daily.   metFORMIN 500 MG tablet Commonly known as: GLUCOPHAGE Take 1 tablet (500 mg total) by mouth 3 (three) times daily. What changed:   how much to take  when to take this   multivitamin tablet Take 1 tablet by mouth  daily.   Nasacort Allergy 24HR 55 MCG/ACT Aero nasal inhaler Generic drug: triamcinolone Place 2 sprays into the nose daily.   vitamin C 1000 MG tablet Take 1,000 mg by mouth daily.       Allergies:  Allergies  Allergen Reactions  . Hctz [Hydrochlorothiazide] Cough  . Lisinopril Cough  . Latex Rash    Condoms only  . Prednisone Anxiety    Paranoia    Family History: Family History  Problem Relation Age of Onset  . Diabetes Maternal Grandmother   . Hypertension Maternal Grandmother   . Hyperlipidemia Maternal Grandmother   . Hypothyroidism Maternal Grandmother   . Heart disease Maternal Grandmother   . Diabetes Mother   . Hypertension Mother   . Hypothyroidism Mother   . Bipolar disorder Mother   . Heart disease Mother   .  Breast cancer Neg Hx     Social History:  reports that she quit smoking about 16 years ago. Her smoking use included cigarettes. She has a 10.00 pack-year smoking history. She has never used smokeless tobacco. She reports current alcohol use. She reports that she does not use drugs.  ROS: UROLOGY Frequent Urination?: Yes Hard to postpone urination?: No Burning/pain with urination?: Yes Get up at night to urinate?: Yes Leakage of urine?: Yes Urine stream starts and stops?: No Trouble starting stream?: No Do you have to strain to urinate?: No Blood in urine?: No Urinary tract infection?: Yes Sexually transmitted disease?: No Injury to kidneys or bladder?: No Painful intercourse?: No Weak stream?: No Currently pregnant?: No Vaginal bleeding?: No Last menstrual period?: N  Gastrointestinal Nausea?: Yes Vomiting?: Yes Indigestion/heartburn?: Yes Diarrhea?: Yes Constipation?: Yes  Constitutional Fever: No Night sweats?: Yes Weight loss?: No Fatigue?: Yes  Skin Skin rash/lesions?: Yes Itching?: Yes  Eyes Blurred vision?: Yes Double vision?: No  Ears/Nose/Throat Sore throat?: Yes Sinus problems?: No  Hematologic/Lymphatic  Swollen glands?: No Easy bruising?: No  Cardiovascular Leg swelling?: Yes Chest pain?: No  Respiratory Cough?: No Shortness of breath?: No  Endocrine Excessive thirst?: Yes  Musculoskeletal Back pain?: Yes Joint pain?: Yes  Neurological Headaches?: Yes Dizziness?: Yes  Psychologic Depression?: Yes Anxiety?: Yes  Physical Exam: BP (!) 171/108   Pulse 83   Ht 5\' 6"  (1.676 m)   Wt 268 lb (121.6 kg)   BMI 43.26 kg/m   Constitutional:  Well nourished. Alert and oriented, No acute distress. HEENT: [place] AT, moist mucus membranes.  Trachea midline, no masses. Cardiovascular: No clubbing, cyanosis, or edema. Respiratory: Normal respiratory effort, no increased work of breathing. Neurologic: Grossly intact, no focal deficits, moving all 4 extremities. Psychiatric: Normal mood and affect.   Laboratory Data: Lab Results  Component Value Date   WBC 11.2 (H) 12/20/2018   HGB 13.8 12/20/2018   HCT 41.4 12/20/2018   MCV 89.2 12/20/2018   PLT 274 12/20/2018    Lab Results  Component Value Date   CREATININE 0.87 12/20/2018    No results found for: PSA  No results found for: TESTOSTERONE  Lab Results  Component Value Date   HGBA1C 8.0 07/17/2018    Lab Results  Component Value Date   TSH 4.680 (H) 12/26/2017    No results found for: CHOL, HDL, CHOLHDL, VLDL, LDLCALC  Lab Results  Component Value Date   AST 67 (H) 12/20/2018   Lab Results  Component Value Date   ALT 118 (H) 12/20/2018   No components found for: ALKALINEPHOPHATASE No components found for: BILIRUBINTOTAL  No results found for: ESTRADIOL  Urinalysis Component     Latest Ref Rng & Units 04/16/2019  Color, Urine     YELLOW YELLOW  Appearance     CLEAR HAZY (A)  Specific Gravity, Urine     1.005 - 1.030 1.025  pH     5.0 - 8.0 6.0  Glucose, UA     NEGATIVE mg/dL NEGATIVE  Hgb urine dipstick     NEGATIVE NEGATIVE  Bilirubin Urine     NEGATIVE NEGATIVE  Ketones, ur     NEGATIVE  mg/dL NEGATIVE  Protein     NEGATIVE mg/dL 30 (A)  Nitrite     NEGATIVE NEGATIVE  Leukocytes,Ua     NEGATIVE NEGATIVE  Squamous Epithelial / LPF     0 - 5 6-10  WBC, UA     0 - 5 WBC/hpf 0-5  RBC / HPF     0 - 5 RBC/hpf NONE SEEN  Bacteria, UA     NONE SEEN FEW (A)   I have reviewed the labs.   Assessment & Plan:    1. History of hematuria Hematuria work up completed in 04/2018 - findings positive for NODULAR AREA OF CYSTITIS CYSTICA.  NEGATIVE FOR ATYPIA AND MALIGNANCY.   DEEPER SECTIONS EXAMINED.  BLADDER, TRIGONE; BIOPSY:  SQUAMOUS METAPLASIA, NON-KERATINIZING.  CYSTITIS CYSTICA WITH FOCAL CALCIFICATION. NEGATIVE FOR ATYPIA AND MALIGNANCY No report of gross hematuria  UA today negative for microscopic hematuria  RTC in one year for UA - patient to report any gross hematuria in the interim    2. Bilateral angiomyolipomas Will order a RUS   3. Mixed incontinence Discussion behavioral therapies, bladder training and bladder control strategies Pelvic floor muscle training - patient is not wanting a referral for PT as she does not feel comfortable placing things in her vaginal canal  Fluid management - avoid Cokes  She is not a good candidate for OAB medications as she has uncontrolled HTN and issues with memory  explained the PTNS provides treatment by indirectly providing electrical stimulation to the nerves responsible for bladder and pelvic floor function - a needle electrode generates an adjustable electrical pulse that travels to the sacral plexus via the tibial nerve which is located in the ankle, among other functions, the sacral nerve plexus regulates bladder and pelvic floor function - treatment protocol requires once-a-week treatments for 12 weeks, 30 minutes per session and many patients begin to see improvements by the 6th treatment. Patients who respond to treatment may require occasional treatments (~ once every 3 weeks) to sustain improvements. PTNS is a low-risk  procedure. The most common side-effects with PTNS treatment are temporary and minor, resulting from the placement of the needle electrode. They include minor bleeding, mild pain and skin inflammation and patients have seen up to an 80% success rate with this form of treatment RTC for PTNS    4. Nocturia Sleeping with CPAP   Return for Patient would like to start PTNS - need to check insurance and need first am appointment in [place] .  These notes generated with voice recognition software. I apologize for typographical errors.  Zara Council, PA-C  [facility] [address]  [place], [place] [address] [phone]

## 2019-04-16 ENCOUNTER — Telehealth: Payer: Self-pay | Admitting: Urology

## 2019-04-16 ENCOUNTER — Other Ambulatory Visit
Admission: RE | Admit: 2019-04-16 | Discharge: 2019-04-16 | Disposition: A | Discharge status: Home / Self Care | Payer: BC Managed Care – PPO | Attending: Urology | Admitting: Urology

## 2019-04-16 ENCOUNTER — Other Ambulatory Visit: Payer: Self-pay

## 2019-04-16 ENCOUNTER — Ambulatory Visit: Payer: BLUE CROSS/BLUE SHIELD | Admitting: Urology

## 2019-04-16 ENCOUNTER — Encounter: Payer: Self-pay | Admitting: Urology

## 2019-04-16 VITALS — BP 171/108 | HR 83 | Ht 66.0 in | Wt 268.0 lb

## 2019-04-16 DIAGNOSIS — R351 Nocturia: Secondary | ICD-10-CM | POA: Diagnosis not present

## 2019-04-16 DIAGNOSIS — Z87448 Personal history of other diseases of urinary system: Secondary | ICD-10-CM | POA: Diagnosis not present

## 2019-04-16 DIAGNOSIS — D1771 Benign lipomatous neoplasm of kidney: Secondary | ICD-10-CM

## 2019-04-16 DIAGNOSIS — N3946 Mixed incontinence: Secondary | ICD-10-CM | POA: Diagnosis not present

## 2019-04-16 LAB — URINALYSIS, COMPLETE (UACMP) WITH MICROSCOPIC
Bilirubin Urine: NEGATIVE
Glucose, UA: NEGATIVE mg/dL
Hgb urine dipstick: NEGATIVE
Ketones, ur: NEGATIVE mg/dL
Leukocytes,Ua: NEGATIVE
Nitrite: NEGATIVE
Protein, ur: 30 mg/dL — AB
RBC / HPF: NONE SEEN RBC/hpf (ref 0–5)
Specific Gravity, Urine: 1.025 (ref 1.005–1.030)
pH: 6 (ref 5.0–8.0)

## 2019-04-16 LAB — BLADDER SCAN AMB NON-IMAGING: Scan Result: 0

## 2019-04-16 NOTE — Telephone Encounter (Signed)
Mrs. Oyen is having flank pain and is interested in PTNS.  Is her RUS order still good or do I need to place another one?  Would you check with her insurance to see if it covers PTNS?

## 2019-04-16 NOTE — Telephone Encounter (Signed)
As far as I can tell the order is still good I sent it over to Manuela Schwartz to call the patient to schedule I will submit a PA for the PTNS   [facility]

## 2019-04-17 DIAGNOSIS — F319 Bipolar disorder, unspecified: Secondary | ICD-10-CM | POA: Diagnosis not present

## 2019-04-18 DIAGNOSIS — R413 Other amnesia: Secondary | ICD-10-CM | POA: Diagnosis not present

## 2019-04-18 NOTE — Telephone Encounter (Signed)
NO PA required Patient will need to pay a $40.00 copay for each PTNS treatment Ref# [id number] Patient agrees to the copay  [facility]

## 2019-04-19 ENCOUNTER — Ambulatory Visit
Admission: RE | Admit: 2019-04-19 | Discharge: 2019-04-19 | Disposition: A | Discharge status: Home / Self Care | Payer: BC Managed Care – PPO | Source: Ambulatory Visit | Attending: Urology | Admitting: Urology

## 2019-04-19 ENCOUNTER — Other Ambulatory Visit: Payer: Self-pay

## 2019-04-19 DIAGNOSIS — D1771 Benign lipomatous neoplasm of kidney: Secondary | ICD-10-CM | POA: Insufficient documentation

## 2019-04-19 DIAGNOSIS — D3001 Benign neoplasm of right kidney: Secondary | ICD-10-CM | POA: Diagnosis not present

## 2019-04-22 DIAGNOSIS — G4733 Obstructive sleep apnea (adult) (pediatric): Secondary | ICD-10-CM | POA: Diagnosis not present

## 2019-04-23 ENCOUNTER — Other Ambulatory Visit: Payer: Self-pay | Admitting: Internal Medicine

## 2019-04-24 DIAGNOSIS — F319 Bipolar disorder, unspecified: Secondary | ICD-10-CM | POA: Diagnosis not present

## 2019-04-29 ENCOUNTER — Ambulatory Visit
Admission: EM | Admit: 2019-04-29 | Discharge: 2019-04-29 | Disposition: A | Discharge status: Home / Self Care | Payer: BC Managed Care – PPO | Attending: Family Medicine | Admitting: Family Medicine

## 2019-04-29 ENCOUNTER — Other Ambulatory Visit: Payer: Self-pay

## 2019-04-29 ENCOUNTER — Encounter: Payer: Self-pay | Admitting: Emergency Medicine

## 2019-04-29 ENCOUNTER — Ambulatory Visit (INDEPENDENT_AMBULATORY_CARE_PROVIDER_SITE_OTHER): Payer: BC Managed Care – PPO

## 2019-04-29 DIAGNOSIS — M546 Pain in thoracic spine: Secondary | ICD-10-CM

## 2019-04-29 DIAGNOSIS — M545 Low back pain, unspecified: Secondary | ICD-10-CM

## 2019-04-29 MED ORDER — MELOXICAM 15 MG PO TABS: 15.0000 mg | ORAL_TABLET | Freq: Every day | ORAL | 0 refills | Status: DC | PRN

## 2019-04-29 NOTE — Discharge Instructions (Addendum)
Take medication as prescribed. Rest. Drink plenty of fluids. Stretch.   Follow up with your primary care physician this week as needed. Return to Urgent care for new or worsening concerns.

## 2019-04-29 NOTE — ED Provider Notes (Signed)
[facility] ____________________________________________  Time seen: Approximately 1:52 PM  I have reviewed the triage vital signs and the nursing notes.   HISTORY  Chief Complaint Back Pain (APPT)  HPI Lori Knight is a 48 y.o. female presenting for evaluation of right back pain.  Patient reports she has had right-sided back pain for at least 6 months, but reports increasing the last 2 months.  Patient states 2 years ago she had a fall which she has had some intermittent back pain to the same area since but not chronically.  Has been following with chiropractor but due to increase pain prompted her to come in today.  No new fall or injuries.  States pain is mostly right lower but intermittently goes up the right mid back as well.  Denies any accompanying abdominal pain, dysuria, change in chronic paresthesias, rash, urinary bowel retention or incontinence.  Has continued been active, continues to ambulate well.  Has taken occasional naproxen but nothing on a recurrent basis.  Patient is requesting x-ray today.  Reports otherwise doing well.  No fevers.  History of diabetes.  No renal insufficiency.  [name], MD: PCP   Past Medical History:  Diagnosis Date  . Adult hypothyroidism 06/21/2012  . Bilateral polycystic ovarian syndrome 06/21/2012  . Biliary calculus with cholecystitis 08/14/2012  . Bipolar 1 disorder ([place])   . BP (high blood pressure) 02/14/2014  . Bronchitis   . Diabetes mellitus ([place]) 08/14/2012  . Diabetes mellitus without complication ([place])    pre-diabetic  . Dysrhythmia    wore heart monitor 2016. Corrected by changing Levothyroxine dose.  Marland Kitchen Heart murmur    followed by PCP-AS A CHILD-ASYMPTOMATIC  . Hyperprolactinemia ([place]) 06/21/2012  . Hypertension   . Motion sickness    carnival rides  . PONV (postoperative nausea and vomiting)    WAKES UP CRYING  . Shortness of breath dyspnea    stairs. related to wt.  . Sleep apnea    has CPAP.  has not used since 2011  . Vertigo    2x in last yr    Patient Active Problem List   Diagnosis Date Noted  . Elevated liver function tests 12/26/2018  . Elevated ferritin 12/20/2018  . Hypoxia 04/17/2018  . Plantar fasciitis 12/26/2017  . Cough in adult patient 12/26/2017  . Spasm of muscle of lower back 05/11/2017  . Fibroma of foot 03/03/2016  . Neuropathic pain of finger 03/03/2016  . Type 2 diabetes mellitus with hyperglycemia, without long-term current use of insulin ([place]) 12/01/2015  . OSA on CPAP 09/22/2015  . Intermittent palpitations 08/28/2015  . GERD (gastroesophageal reflux disease) 08/28/2015  . Insomnia 08/28/2015  . Internal hemorrhoids 08/28/2015  . Vitamin D deficiency 08/28/2015  . Obesity, Class III, BMI 40-49.9 (morbid obesity) ([place]) 08/27/2015  . Hypertension 02/14/2014  . Benign paroxysmal positional nystagmus 02/14/2014  . 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 Bipolar 1 disorder ([place]) 08/14/2012  . Adult hypothyroidism 06/21/2012  . Bilateral polycystic ovarian syndrome 06/21/2012    Past Surgical History:  Procedure Laterality Date  . ABDOMINAL HYSTERECTOMY  2012   cervical dysplasia/ovaries remian  . CHOLECYSTECTOMY    . COLONOSCOPY WITH PROPOFOL N/A 06/19/2015   Procedure: COLONOSCOPY WITH PROPOFOL;  Surgeon: [name], MD;  Location: [place];  Service: Endoscopy;  Laterality: N/A;  Diabetic - oral meds   . DENTAL SURGERY    . ETHMOIDECTOMY Bilateral 04/17/2018   Procedure: ETHMOIDECTOMY;  Surgeon: [name], MD;  Location:  [facility];  Service: ENT;  Laterality: Bilateral;  . GALLBLADDER SURGERY    . IMAGE GUIDED SINUS SURGERY N/A 04/17/2018   Procedure: IMAGE GUIDED SINUS SURGERY;  Surgeon: [name], MD;  Location: [facility];  Service: ENT;  Laterality: N/A;  . MAXILLARY ANTROSTOMY Bilateral 04/17/2018   Procedure: MAXILLARY ANTROSTOMY;  Surgeon: [name], MD;  Location: [facility];  Service: ENT;  Laterality: Bilateral;   . NASAL SEPTOPLASTY W/ TURBINOPLASTY Bilateral 04/17/2018   Procedure: NASAL SEPTOPLASTY WITH TURBINATE REDUCTION;  Surgeon: [name], MD;  Location: [facility];  Service: ENT;  Laterality: Bilateral;  . TRANSURETHRAL RESECTION OF BLADDER TUMOR N/A 04/17/2018   Procedure: NTRANSURETHRAL RESECTION OF BLADDER TUMOR (TURBT) WITH GEMCITABINE;  Surgeon: [name], MD;  Location: [facility];  Service: Urology;  Laterality: N/A;     No current facility-administered medications for this encounter.   Current Outpatient Medications:  .  Ascorbic Acid (VITAMIN C) 1000 MG tablet, Take 1,000 mg by mouth daily., Disp: , Rfl:  .  Cholecalciferol 5000 units capsule, Take 1 capsule (5,000 Units total) by mouth daily., Disp: , Rfl:  .  Dulaglutide 1.5 MG/0.5ML SOPN, Inject 1.5 mg into the skin once a week., Disp: , Rfl:  .  famotidine (PEPCID) 20 MG tablet, Take 1 tablet (20 mg total) by mouth 2 (two) times daily., Disp: 180 tablet, Rfl: 1 .  lamoTRIgine (LAMICTAL) 100 MG tablet, Take 300 mg by mouth every morning. , Disp: , Rfl:  .  levothyroxine (SYNTHROID, LEVOTHROID) 175 MCG tablet, TAKE 1 TABLET (175 MCG TOTAL) BY MOUTH DAILY., Disp: 30 tablet, Rfl: 12 .  losartan (COZAAR) 100 MG tablet, TAKE 1/2 TABLET (50 MG TOTAL) BY MOUTH 2 (TWO) TIMES DAILY., Disp: 90 tablet, Rfl: 3 .  metFORMIN (GLUCOPHAGE) 500 MG tablet, Take 1 tablet (500 mg total) by mouth 3 (three) times daily. (Patient taking differently: Take 1,000 mg by mouth 2 (two) times daily with a meal. ), Disp: 270 tablet, Rfl: 0 .  Multiple Vitamin (MULTIVITAMIN) tablet, Take 1 tablet by mouth daily., Disp: , Rfl:  .  Omega-3 Fatty Acids (FISH OIL) 1200 MG CAPS, Take 1,200 mg by mouth daily. , Disp: , Rfl:  .  triamcinolone (NASACORT ALLERGY 24HR) 55 MCG/ACT AERO nasal inhaler, Place 2 sprays into the nose daily., Disp: , Rfl:  .  albuterol (VENTOLIN HFA) 108 (90 Base) MCG/ACT inhaler, Inhale 2 puffs into the lungs every 4 (four) hours as needed for  wheezing or shortness of breath., Disp: 1 Inhaler, Rfl: 1 .  cyclobenzaprine (FEXMID) 7.5 MG tablet, Take 7.5 mg by mouth 2 (two) times daily as needed for muscle spasms. , Disp: , Rfl:  .  diazepam (VALIUM) 2 MG tablet, take 30 min prior to MRI, may take 1 additional dose if needed. Must have driver., Disp: , Rfl:  .  glucose blood (CONTOUR NEXT TEST) test strip, Use to check BS up to 3 times daily for Diabetes DX: E11.9, Disp: 100 each, Rfl: 12 .  ibuprofen (ADVIL,MOTRIN) 200 MG tablet, Take 400 mg by mouth every 8 (eight) hours as needed for moderate pain., Disp: , Rfl:  .  meloxicam (MOBIC) 15 MG tablet, Take 1 tablet (15 mg total) by mouth daily as needed., Disp: 20 tablet, Rfl: 0  Allergies Hctz [hydrochlorothiazide], Lisinopril, Latex, and Prednisone  Family History  Problem Relation Age of Onset  . Diabetes Maternal Grandmother   . Hypertension Maternal Grandmother   . Hyperlipidemia Maternal Grandmother   . Hypothyroidism Maternal  Grandmother   . Heart disease Maternal Grandmother   . Diabetes Mother   . Hypertension Mother   . Hypothyroidism Mother   . Bipolar disorder Mother   . Heart disease Mother   . Breast cancer Neg Hx     Social History Social History   Tobacco Use  . Smoking status: Former Smoker    Packs/day: 1.00    Years: 10.00    Pack years: 10.00    Types: Cigarettes    Quit date: 07/12/2002    Years since quitting: 16.8  . Smokeless tobacco: Never Used  . Tobacco comment: quit 2004  Substance Use Topics  . Alcohol use: Yes    Alcohol/week: 0.0 standard drinks    Comment: rarely  . Drug use: No    Review of Systems Constitutional: No fever Cardiovascular: Denies chest pain. Respiratory: Denies shortness of breath. Gastrointestinal: No abdominal pain.  No nausea, no vomiting.  No diarrhea.  No constipation. Genitourinary: Negative for dysuria. Musculoskeletal: Positive for back pain. Skin: Negative for rash.    ____________________________________________   PHYSICAL EXAM:  VITAL SIGNS: ED Triage Vitals  Enc Vitals Group     BP 04/29/19 1313 (!) 149/103     Pulse Rate 04/29/19 1313 87     Resp 04/29/19 1313 16     Temp 04/29/19 1313 98.3 F (36.8 C)     Temp Source 04/29/19 1313 Oral     SpO2 04/29/19 1313 98 %     Weight 04/29/19 1309 290 lb (131.5 kg)     Height 04/29/19 1309 5\' 6"  (1.676 m)     Head Circumference --      Peak Flow --      Pain Score 04/29/19 1309 7     Pain Loc --      Pain Edu? --      Excl. in [place]? --     Constitutional: Alert and oriented. Well appearing and in no acute distress. Eyes: Conjunctivae are normal.  ENT      Head: Normocephalic and atraumatic. Cardiovascular: Normal rate, regular rhythm. Grossly normal heart sounds.  Good peripheral circulation. Respiratory: Normal respiratory effort without tachypnea nor retractions. Breath sounds are clear and equal bilaterally. No wheezes, rales, rhonchi. Gastrointestinal: Soft and nontender.No CVA tenderness. Musculoskeletal:  Steady gait.  Bilateral pedal pulses equal and easily palpated.  No cervical tenderness palpation.  Diffuse mild midline lower thoracic to lumbar tenderness palpation with diffuse right para lumbar tenderness to palpation and tenderness over sciatic notch, no rash, no edema, able to fully lumbar flex and extend with mild pain as well as right and left lumbar rotation, no pain with standing bilateral knee lifts, no saddle anesthesia, bilateral plantar flexion dorsiflexion equal.  Changes positions quickly in room. Neurologic:  Normal speech and language. Speech is normal. No gait instability.  Skin:  Skin is warm, dry and intact. No rash noted. Psychiatric: Mood and affect are normal. Speech and behavior are normal. Patient exhibits appropriate insight and judgment   ___________________________________________   LABS (all labs ordered are listed, but only abnormal results are displayed)   Labs Reviewed - No data to display  RADIOLOGY  Dg Thoracic Spine 2 View  Result Date: 04/29/2019 CLINICAL DATA:  Acute on chronic mid back pain. EXAM: THORACIC SPINE 2 VIEWS COMPARISON:  None. FINDINGS: No acute fracture or subluxation. Mild anterior spondylosis noted at several levels. No focal bony lesions are present. IMPRESSION: No acute abnormality. Electronically Signed   By: Margarette Canada  M.D.   On: 04/29/2019 14:35   Dg Lumbar Spine Complete  Result Date: 04/29/2019 CLINICAL DATA:  Acute on chronic low back pain. EXAM: LUMBAR SPINE - COMPLETE 4+ VIEW COMPARISON:  None. FINDINGS: Five non rib-bearing lumbar type vertebra are identified in normal alignment. No acute fracture or subluxation. No focal bony lesions or spondylolysis. IMPRESSION: No acute or significant abnormality. Electronically Signed   By: Margarette Canada M.D.   On: 04/29/2019 14:36   ____________________________________________   PROCEDURES Procedures    INITIAL IMPRESSION / ASSESSMENT AND PLAN / ED COURSE  Pertinent labs & imaging results that were available during my care of the patient were reviewed by me and considered in my medical decision making (see chart for details).  Well-appearing patient.  No acute distress.  Diffuse right lower back pain.  Requesting x-ray.  Lumbar and thoracic x-rays completed, results as above and discussed with patient.  Will treat with daily Mobic.  Encourage stretching, supportive care and follow-up with primary care.Discussed indication, risks and benefits of medications with patient.  Discussed follow up with Primary care physician this week. Discussed follow up and return parameters including no resolution or any worsening concerns. Patient verbalized understanding and agreed to plan.   ____________________________________________   FINAL CLINICAL IMPRESSION(S) / ED DIAGNOSES  Final diagnoses:  Acute right-sided thoracic back pain  Lumbar back pain     ED Discharge  Orders         Ordered    meloxicam (MOBIC) 15 MG tablet  Daily PRN     04/29/19 1442           Note: This dictation was prepared with Dragon dictation along with smaller phrase technology. Any transcriptional errors that result from this process are unintentional.         Marylene Land, NP 04/29/19 1458

## 2019-04-29 NOTE — ED Triage Notes (Signed)
Patient c/o lower right sided back pain for 4-6 months.  Patient states that she has been seeing a chiropractor and has gotten worse.

## 2019-05-03 DIAGNOSIS — F319 Bipolar disorder, unspecified: Secondary | ICD-10-CM | POA: Diagnosis not present

## 2019-05-09 DIAGNOSIS — F319 Bipolar disorder, unspecified: Secondary | ICD-10-CM | POA: Diagnosis not present

## 2019-05-18 ENCOUNTER — Other Ambulatory Visit: Payer: Self-pay | Admitting: Internal Medicine

## 2019-05-23 DIAGNOSIS — G4733 Obstructive sleep apnea (adult) (pediatric): Secondary | ICD-10-CM | POA: Diagnosis not present

## 2019-05-23 DIAGNOSIS — F319 Bipolar disorder, unspecified: Secondary | ICD-10-CM | POA: Diagnosis not present

## 2019-05-25 ENCOUNTER — Ambulatory Visit: Payer: Self-pay | Admitting: Physician Assistant

## 2019-05-25 ENCOUNTER — Ambulatory Visit: Payer: BC Managed Care – PPO | Admitting: Urology

## 2019-05-25 DIAGNOSIS — G4733 Obstructive sleep apnea (adult) (pediatric): Secondary | ICD-10-CM | POA: Diagnosis not present

## 2019-05-25 DIAGNOSIS — G4719 Other hypersomnia: Secondary | ICD-10-CM | POA: Diagnosis not present

## 2019-05-25 DIAGNOSIS — F319 Bipolar disorder, unspecified: Secondary | ICD-10-CM | POA: Diagnosis not present

## 2019-05-29 ENCOUNTER — Ambulatory Visit: Payer: Self-pay | Admitting: Urology

## 2019-05-30 ENCOUNTER — Encounter: Payer: Self-pay | Admitting: Internal Medicine

## 2019-05-30 ENCOUNTER — Other Ambulatory Visit: Payer: Self-pay | Admitting: Internal Medicine

## 2019-05-30 DIAGNOSIS — F411 Generalized anxiety disorder: Secondary | ICD-10-CM | POA: Diagnosis not present

## 2019-05-30 DIAGNOSIS — F3161 Bipolar disorder, current episode mixed, mild: Secondary | ICD-10-CM | POA: Diagnosis not present

## 2019-05-30 DIAGNOSIS — F064 Anxiety disorder due to known physiological condition: Secondary | ICD-10-CM | POA: Diagnosis not present

## 2019-05-30 DIAGNOSIS — K219 Gastro-esophageal reflux disease without esophagitis: Secondary | ICD-10-CM

## 2019-05-30 DIAGNOSIS — F603 Borderline personality disorder: Secondary | ICD-10-CM | POA: Diagnosis not present

## 2019-05-30 MED ORDER — OMEPRAZOLE 40 MG PO CPDR: 40.0000 mg | DELAYED_RELEASE_CAPSULE | Freq: Every day | ORAL | 1 refills | Status: DC

## 2019-05-30 NOTE — Telephone Encounter (Signed)
Please advise patient message. She wants a different acid reflux medication. She also is having issues with hypertension and back pain.  OV?

## 2019-05-31 DIAGNOSIS — E1165 Type 2 diabetes mellitus with hyperglycemia: Secondary | ICD-10-CM | POA: Diagnosis not present

## 2019-05-31 DIAGNOSIS — E039 Hypothyroidism, unspecified: Secondary | ICD-10-CM | POA: Diagnosis not present

## 2019-05-31 DIAGNOSIS — I10 Essential (primary) hypertension: Secondary | ICD-10-CM | POA: Diagnosis not present

## 2019-05-31 LAB — HEPATIC FUNCTION PANEL
ALT: 98 — AB (ref 7–35)
AST: 56 — AB (ref 13–35)
Alkaline Phosphatase: 102 (ref 25–125)
Bilirubin, Total: 0.4

## 2019-05-31 LAB — LIPID PANEL
Cholesterol: 204 — AB (ref 0–200)
Cholesterol: 204 — AB (ref 0–200)
HDL: 44 (ref 35–70)
HDL: 44 (ref 35–70)
LDL Cholesterol: 121
LDL Cholesterol: 121
LDl/HDL Ratio: 4.6
Triglycerides: 196 — AB (ref 40–160)
Triglycerides: 196 — AB (ref 40–160)

## 2019-05-31 LAB — BASIC METABOLIC PANEL
BUN: 9 (ref 4–21)
CO2: 29 — AB (ref 13–22)
Chloride: 102 (ref 99–108)
Creatinine: 0.8 (ref 0.5–1.1)
Glucose: 146
Potassium: 4.3 (ref 3.4–5.3)
Sodium: 139 (ref 137–147)

## 2019-05-31 LAB — HEMOGLOBIN A1C
Hemoglobin A1C: 7.8
Hemoglobin A1C: 7.8

## 2019-05-31 LAB — TSH
TSH: 5.3 (ref 0.41–5.90)
TSH: 5.38 (ref 0.41–5.90)

## 2019-05-31 LAB — COMPREHENSIVE METABOLIC PANEL: Calcium: 9 (ref 8.7–10.7)

## 2019-06-01 ENCOUNTER — Ambulatory Visit: Payer: Self-pay | Admitting: Physician Assistant

## 2019-06-04 DIAGNOSIS — I10 Essential (primary) hypertension: Secondary | ICD-10-CM | POA: Diagnosis not present

## 2019-06-04 DIAGNOSIS — E1165 Type 2 diabetes mellitus with hyperglycemia: Secondary | ICD-10-CM | POA: Diagnosis not present

## 2019-06-04 DIAGNOSIS — E039 Hypothyroidism, unspecified: Secondary | ICD-10-CM | POA: Diagnosis not present

## 2019-06-08 DIAGNOSIS — H52223 Regular astigmatism, bilateral: Secondary | ICD-10-CM | POA: Diagnosis not present

## 2019-06-08 DIAGNOSIS — H524 Presbyopia: Secondary | ICD-10-CM | POA: Diagnosis not present

## 2019-06-08 DIAGNOSIS — H5213 Myopia, bilateral: Secondary | ICD-10-CM | POA: Diagnosis not present

## 2019-06-08 LAB — HM DIABETES EYE EXAM

## 2019-06-13 ENCOUNTER — Other Ambulatory Visit: Payer: Self-pay | Admitting: Internal Medicine

## 2019-06-13 DIAGNOSIS — F319 Bipolar disorder, unspecified: Secondary | ICD-10-CM | POA: Diagnosis not present

## 2019-06-13 DIAGNOSIS — F603 Borderline personality disorder: Secondary | ICD-10-CM | POA: Diagnosis not present

## 2019-06-15 ENCOUNTER — Ambulatory Visit: Payer: Self-pay | Admitting: Physician Assistant

## 2019-06-15 ENCOUNTER — Other Ambulatory Visit: Payer: Self-pay | Admitting: Internal Medicine

## 2019-06-15 ENCOUNTER — Encounter: Payer: Self-pay | Admitting: Internal Medicine

## 2019-06-15 DIAGNOSIS — Z1231 Encounter for screening mammogram for malignant neoplasm of breast: Secondary | ICD-10-CM

## 2019-06-20 ENCOUNTER — Ambulatory Visit (INDEPENDENT_AMBULATORY_CARE_PROVIDER_SITE_OTHER): Payer: BC Managed Care – PPO | Admitting: Internal Medicine

## 2019-06-20 ENCOUNTER — Encounter: Payer: Self-pay | Admitting: Internal Medicine

## 2019-06-20 ENCOUNTER — Other Ambulatory Visit: Payer: Self-pay

## 2019-06-20 VITALS — BP 140/94 | HR 70 | Ht 66.0 in | Wt 287.0 lb

## 2019-06-20 DIAGNOSIS — Z0000 Encounter for general adult medical examination without abnormal findings: Secondary | ICD-10-CM | POA: Diagnosis not present

## 2019-06-20 DIAGNOSIS — E559 Vitamin D deficiency, unspecified: Secondary | ICD-10-CM

## 2019-06-20 DIAGNOSIS — E118 Type 2 diabetes mellitus with unspecified complications: Secondary | ICD-10-CM

## 2019-06-20 DIAGNOSIS — K219 Gastro-esophageal reflux disease without esophagitis: Secondary | ICD-10-CM | POA: Diagnosis not present

## 2019-06-20 DIAGNOSIS — Z1231 Encounter for screening mammogram for malignant neoplasm of breast: Secondary | ICD-10-CM

## 2019-06-20 DIAGNOSIS — I10 Essential (primary) hypertension: Secondary | ICD-10-CM

## 2019-06-20 DIAGNOSIS — Z9989 Dependence on other enabling machines and devices: Secondary | ICD-10-CM

## 2019-06-20 DIAGNOSIS — F319 Bipolar disorder, unspecified: Secondary | ICD-10-CM

## 2019-06-20 DIAGNOSIS — G4733 Obstructive sleep apnea (adult) (pediatric): Secondary | ICD-10-CM

## 2019-06-20 LAB — POCT URINALYSIS DIPSTICK
Bilirubin, UA: NEGATIVE
Blood, UA: NEGATIVE
Glucose, UA: NEGATIVE
Ketones, UA: NEGATIVE
Leukocytes, UA: NEGATIVE
Nitrite, UA: NEGATIVE
Protein, UA: NEGATIVE
Spec Grav, UA: 1.01 (ref 1.010–1.025)
Urobilinogen, UA: 0.2 E.U./dL
pH, UA: 6 (ref 5.0–8.0)

## 2019-06-20 LAB — GLUCOSE, POCT (MANUAL RESULT ENTRY): POC Glucose: 105 mg/dl — AB (ref 70–99)

## 2019-06-20 MED ORDER — AMLODIPINE BESYLATE 5 MG PO TABS: 5.0000 mg | ORAL_TABLET | Freq: Every day | ORAL | 3 refills | Status: DC

## 2019-06-20 NOTE — Progress Notes (Signed)
Date:  06/20/2019   Name:  Lori Knight   DOB:  12-26-1970   MRN:  [id number]   Chief Complaint: Annual Exam (Breast Exam.) and Hypertension Lori Knight is a 48 y.o. female who presents today for her Complete Annual Exam. She feels well. She reports exercising walking. She reports she is sleeping fairly well. She denies breast issues.   Mammogram - scheduled Pap - discontinued Colonoscopy 2016 Declines flu vaccine Immunization History  Administered Date(s) Administered  . Pneumococcal Polysaccharide-23 05/09/2014    Hypertension This is a chronic problem. The problem is controlled. Pertinent negatives include no chest pain, headaches, palpitations or shortness of breath. Past treatments include angiotensin blockers. The current treatment provides significant improvement.  Gastroesophageal Reflux She complains of heartburn and water brash. She reports no abdominal pain, no chest pain, no coughing or no wheezing. This is a recurrent problem. The problem occurs rarely. The problem has been rapidly improving. Pertinent negatives include no fatigue. She has tried a PPI (changed from H2 blocker to PPI recently with excellent improvement in sx) for the symptoms.  Diabetes She presents for her follow-up diabetic visit. She has type 2 diabetes mellitus. Her disease course has been improving. Pertinent negatives for hypoglycemia include no dizziness, headaches, nervousness/anxiousness or tremors. Pertinent negatives for diabetes include no chest pain, no fatigue, no polydipsia and no polyuria. Symptoms are improving. Current diabetic treatments: trulicity, metformin, glipizide (just added) She is compliant with treatment most of the time. An ACE inhibitor/angiotensin II receptor blocker is being taken. Eye exam is current.  Back and muscle pain - she has been seen by Neurology for memory issues and recommended to have sleep study and evaluation for fibromyalgia and sleep deprivation.  She  has chronic back pain that has been relieved with Emu Oil.  She is still waiting for the tests and specialist appointments recommended by Neurology.  Lab Results  Component Value Date   CREATININE 0.8 05/31/2019   BUN 9 05/31/2019   NA 139 05/31/2019   K 4.3 05/31/2019   CL 102 05/31/2019   CO2 29 (A) 05/31/2019   Lab Results  Component Value Date   CHOL 204 (A) 05/31/2019   CHOL 204 (A) 05/31/2019   HDL 44 05/31/2019   HDL 44 05/31/2019   LDLCALC 121 05/31/2019   LDLCALC 121 05/31/2019   TRIG 196 (A) 05/31/2019   TRIG 196 (A) 05/31/2019   Lab Results  Component Value Date   TSH 5.30 05/31/2019   TSH 5.38 05/31/2019   Lab Results  Component Value Date   HGBA1C 7.8 05/31/2019   HGBA1C 7.8 05/31/2019     Review of Systems  Constitutional: Negative for chills, fatigue and fever.  HENT: Negative for congestion, hearing loss, tinnitus, trouble swallowing and voice change.   Eyes: Negative for visual disturbance.  Respiratory: Negative for cough, chest tightness, shortness of breath and wheezing.   Cardiovascular: Negative for chest pain, palpitations and leg swelling.  Gastrointestinal: Positive for heartburn. Negative for abdominal pain, constipation, diarrhea and vomiting.  Endocrine: Negative for polydipsia and polyuria.  Genitourinary: Negative for dysuria, frequency, genital sores, vaginal bleeding and vaginal discharge.  Musculoskeletal: Positive for arthralgias and myalgias. Negative for gait problem and joint swelling.  Skin: Negative for color change and rash.  Neurological: Negative for dizziness, tremors, light-headedness and headaches.  Hematological: Negative for adenopathy. Does not bruise/bleed easily.  Psychiatric/Behavioral: Negative for dysphoric mood and sleep disturbance. The patient is not nervous/anxious.  Patient Active Problem List   Diagnosis Date Noted  . Gastroesophageal reflux disease without esophagitis 06/20/2019  . Elevated liver  function tests 12/26/2018  . Elevated ferritin 12/20/2018  . Plantar fasciitis 12/26/2017  . Cough in adult patient 12/26/2017  . Spasm of muscle of lower back 05/11/2017  . Fibroma of foot 03/03/2016  . Neuropathic pain of finger 03/03/2016  . Type II diabetes mellitus with complication ([place]) [id number]  . OSA on CPAP 09/22/2015  . Intermittent palpitations 08/28/2015  . Insomnia 08/28/2015  . Internal hemorrhoids 08/28/2015  . Vitamin D deficiency 08/28/2015  . Obesity, Class III, BMI 40-49.9 (morbid obesity) ([place]) 08/27/2015  . Essential hypertension 02/14/2014  . Benign paroxysmal positional nystagmus 02/14/2014  . 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 Bipolar 1 disorder ([place]) 08/14/2012  . Adult hypothyroidism 06/21/2012  . Bilateral polycystic ovarian syndrome 06/21/2012    Allergies  Allergen Reactions  . Hctz [Hydrochlorothiazide] Cough  . Lisinopril Cough  . Latex Rash    Condoms only  . Prednisone Anxiety    Paranoia    Past Surgical History:  Procedure Laterality Date  . ABDOMINAL HYSTERECTOMY  2012   cervical dysplasia/ovaries remian  . CHOLECYSTECTOMY    . COLONOSCOPY WITH PROPOFOL N/A 06/19/2015   Procedure: COLONOSCOPY WITH PROPOFOL;  Surgeon: [name], MD;  Location: [facility];  Service: Endoscopy;  Laterality: N/A;  Diabetic - oral meds   . DENTAL SURGERY    . ETHMOIDECTOMY Bilateral 04/17/2018   Procedure: ETHMOIDECTOMY;  Surgeon: [name], MD;  Location: [facility];  Service: ENT;  Laterality: Bilateral;  . GALLBLADDER SURGERY    . IMAGE GUIDED SINUS SURGERY N/A 04/17/2018   Procedure: IMAGE GUIDED SINUS SURGERY;  Surgeon: [name], MD;  Location: [facility];  Service: ENT;  Laterality: N/A;  . MAXILLARY ANTROSTOMY Bilateral 04/17/2018   Procedure: MAXILLARY ANTROSTOMY;  Surgeon: [name], MD;  Location: [facility];  Service: ENT;  Laterality: Bilateral;  . NASAL SEPTOPLASTY W/ TURBINOPLASTY Bilateral 04/17/2018   Procedure: NASAL  SEPTOPLASTY WITH TURBINATE REDUCTION;  Surgeon: [name], MD;  Location: [facility];  Service: ENT;  Laterality: Bilateral;  . TRANSURETHRAL RESECTION OF BLADDER TUMOR N/A 04/17/2018   Procedure: NTRANSURETHRAL RESECTION OF BLADDER TUMOR (TURBT) WITH GEMCITABINE;  Surgeon: [name], MD;  Location: [facility];  Service: Urology;  Laterality: N/A;    Social History   Tobacco Use  . Smoking status: Former Smoker    Packs/day: 1.00    Years: 10.00    Pack years: 10.00    Types: Cigarettes    Quit date: 07/12/2002    Years since quitting: 16.9  . Smokeless tobacco: Never Used  . Tobacco comment: quit 2004  Substance Use Topics  . Alcohol use: Yes    Alcohol/week: 0.0 standard drinks    Comment: rarely  . Drug use: No     Medication list has been reviewed and updated.  Current Meds  Medication Sig  . albuterol (VENTOLIN HFA) 108 (90 Base) MCG/ACT inhaler Inhale 2 puffs into the lungs every 4 (four) hours as needed for wheezing or shortness of breath.  . Ascorbic Acid (VITAMIN C) 1000 MG tablet Take 1,000 mg by mouth daily.  . Cholecalciferol 5000 units capsule Take 1 capsule (5,000 Units total) by mouth daily.  . CONTOUR NEXT TEST test strip USE TO CHECK BS UP TO 3 TIMES DAILY FOR DIABETES DX: E11.9  . Dulaglutide 1.5 MG/0.5ML SOPN Inject 1.5 mg into the skin once a week.  Marland Kitchen  glipiZIDE (GLUCOTROL) 5 MG tablet Take 5 mg by mouth every morning.  Marland Kitchen ibuprofen (ADVIL,MOTRIN) 200 MG tablet Take 400 mg by mouth every 8 (eight) hours as needed for moderate pain.  Marland Kitchen lamoTRIgine (LAMICTAL) 150 MG tablet Take 300 mg by mouth daily.  Marland Kitchen levothyroxine (SYNTHROID, LEVOTHROID) 175 MCG tablet TAKE 1 TABLET (175 MCG TOTAL) BY MOUTH DAILY.  Marland Kitchen losartan (COZAAR) 100 MG tablet TAKE 1/2 TABLET (50 MG TOTAL) BY MOUTH 2 (TWO) TIMES DAILY.  . meloxicam (MOBIC) 15 MG tablet Take 1 tablet (15 mg total) by mouth daily as needed.  . metFORMIN (GLUCOPHAGE) 500 MG tablet Take 1 tablet (500 mg total) by mouth 3  (three) times daily. (Patient taking differently: Take 1,000 mg by mouth 2 (two) times daily with a meal. )  . Multiple Vitamin (MULTIVITAMIN) tablet Take 1 tablet by mouth daily.  . Omega-3 Fatty Acids (FISH OIL) 1200 MG CAPS Take 1,200 mg by mouth daily.   Marland Kitchen omeprazole (PRILOSEC) 40 MG capsule Take 1 capsule (40 mg total) by mouth daily.  Marland Kitchen triamcinolone (NASACORT ALLERGY 24HR) 55 MCG/ACT AERO nasal inhaler Place 2 sprays into the nose daily.    PHQ 2/9 Scores 06/20/2019 09/15/2018 05/05/2018 01/27/2018  PHQ - 2 Score 2 2 4  0  PHQ- 9 Score 5 2 9  -  Exception Documentation - - - Other- indicate reason in comment box  Not completed - - - Pt is treated and followed by Psychiatry    BP Readings from Last 3 Encounters:  06/20/19 (!) 140/94  04/29/19 (!) 149/103  04/16/19 (!) 171/108    Physical Exam Vitals signs and nursing note reviewed.  Constitutional:      General: She is not in acute distress.    Appearance: She is well-developed. She is obese.  HENT:     Head: Normocephalic and atraumatic.     Right Ear: Tympanic membrane and ear canal normal.     Left Ear: Tympanic membrane and ear canal normal.     Nose:     Right Sinus: No maxillary sinus tenderness.     Left Sinus: No maxillary sinus tenderness.  Eyes:     General: No scleral icterus.       Right eye: No discharge.        Left eye: No discharge.     Conjunctiva/sclera: Conjunctivae normal.  Neck:     Musculoskeletal: Normal range of motion. No erythema.     Thyroid: No thyromegaly.     Vascular: No carotid bruit.  Cardiovascular:     Rate and Rhythm: Normal rate and regular rhythm.     Pulses: Normal pulses.     Heart sounds: Normal heart sounds.  Pulmonary:     Effort: Pulmonary effort is normal. No respiratory distress.     Breath sounds: No wheezing.  Chest:     Breasts:        Right: No mass, nipple discharge, skin change or tenderness.        Left: No mass, nipple discharge, skin change or tenderness.   Abdominal:     General: Bowel sounds are normal.     Palpations: Abdomen is soft.     Tenderness: There is no abdominal tenderness.  Musculoskeletal: Normal range of motion.     Lumbar back: She exhibits no bony tenderness.     Right lower leg: No edema.     Left lower leg: No edema.     Comments: Tender over the muscles of the mid  and lower back to light palpation.  No discrete spasm noted.   Lymphadenopathy:     Cervical: No cervical adenopathy.  Skin:    General: Skin is warm and dry.     Capillary Refill: Capillary refill takes less than 2 seconds.     Findings: No rash.  Neurological:     General: No focal deficit present.     Mental Status: She is alert and oriented to person, place, and time.     Cranial Nerves: No cranial nerve deficit.     Sensory: No sensory deficit.     Deep Tendon Reflexes: Reflexes are normal and symmetric.  Psychiatric:        Attention and Perception: Attention normal.        Mood and Affect: Mood is anxious.        Speech: Speech normal.        Behavior: Behavior normal.        Thought Content: Thought content normal.        Cognition and Memory: Cognition normal.     Wt Readings from Last 3 Encounters:  06/20/19 287 lb (130.2 kg)  04/29/19 290 lb (131.5 kg)  04/16/19 268 lb (121.6 kg)    BP (!) 140/94   Pulse 70   Ht 5\' 6"  (1.676 m)   Wt 287 lb (130.2 kg)   SpO2 98%   BMI 46.32 kg/m   Assessment and Plan: 1. Annual physical exam Continue regular exercise, work on diet - POCT urinalysis dipstick  2. Encounter for screening mammogram for breast cancer scheduled  3. Essential hypertension Not controlled on ARB - will add amlodipine 5 mg and recheck in 2 months - CBC with Differential/Platelet - amLODipine (NORVASC) 5 MG tablet; Take 1 tablet (5 mg total) by mouth daily.  Dispense: 90 tablet; Refill: 3  4. Type II diabetes mellitus with complication ([facility]) Clinically stable by exam and report without s/s of hypoglycemia. Some  FSBS are in the 80's which concern her. Being managed by Endocrinology DM complicated by HTN, lipids. Tolerating medications well without side effects or other concerns. - POCT Glucose (CBG)  5. Gastroesophageal reflux disease without esophagitis Symptoms well controlled on daily PPI No red flag signs such as weight loss, n/v, melena Will continue omeprazole daily  6. Bipolar 1 disorder ([place]) Followed by Psych - establishing with a new provider in the same clinic Clinically stable and doing well.  No SI/HI  7. Obesity, Class III, BMI 40-49.9 (morbid obesity) ([place]) Working on exercise daily, diet changes  8. Vitamin D deficiency Check levels and advise regarding supplementation Low levels could contribute to myalgias - Vitamin D (25 hydroxy)  9. OSA on CPAP Recommended to have a repeat study to confirm adequate treatment - pt awaiting that appointment   Partially dictated using Dragon software. Any errors are unintentional.  Halina Maidens, MD [facility]  06/20/2019

## 2019-06-21 LAB — CBC WITH DIFFERENTIAL/PLATELET
Basophils Absolute: 0.1 10*3/uL (ref 0.0–0.2)
Basos: 1 %
EOS (ABSOLUTE): 0.2 10*3/uL (ref 0.0–0.4)
Eos: 2 %
Hematocrit: 41.8 % (ref 34.0–46.6)
Hemoglobin: 13.6 g/dL (ref 11.1–15.9)
Immature Grans (Abs): 0.1 10*3/uL (ref 0.0–0.1)
Immature Granulocytes: 1 %
Lymphocytes Absolute: 2.7 10*3/uL (ref 0.7–3.1)
Lymphs: 25 %
MCH: 28.8 pg (ref 26.6–33.0)
MCHC: 32.5 g/dL (ref 31.5–35.7)
MCV: 89 fL (ref 79–97)
Monocytes Absolute: 0.8 10*3/uL (ref 0.1–0.9)
Monocytes: 8 %
Neutrophils Absolute: 6.7 10*3/uL (ref 1.4–7.0)
Neutrophils: 63 %
Platelets: 309 10*3/uL (ref 150–450)
RBC: 4.72 x10E6/uL (ref 3.77–5.28)
RDW: 13.6 % (ref 11.7–15.4)
WBC: 10.6 10*3/uL (ref 3.4–10.8)

## 2019-06-21 LAB — VITAMIN D 25 HYDROXY (VIT D DEFICIENCY, FRACTURES): Vit D, 25-Hydroxy: 45.9 ng/mL (ref 30.0–100.0)

## 2019-06-22 ENCOUNTER — Ambulatory Visit: Payer: Self-pay | Admitting: Physician Assistant

## 2019-06-22 DIAGNOSIS — G4733 Obstructive sleep apnea (adult) (pediatric): Secondary | ICD-10-CM | POA: Diagnosis not present

## 2019-06-22 DIAGNOSIS — F319 Bipolar disorder, unspecified: Secondary | ICD-10-CM | POA: Diagnosis not present

## 2019-06-22 DIAGNOSIS — F603 Borderline personality disorder: Secondary | ICD-10-CM | POA: Diagnosis not present

## 2019-06-26 ENCOUNTER — Ambulatory Visit
Admission: RE | Admit: 2019-06-26 | Discharge: 2019-06-26 | Disposition: A | Discharge status: Home / Self Care | Payer: BC Managed Care – PPO | Source: Ambulatory Visit | Attending: Internal Medicine | Admitting: Internal Medicine

## 2019-06-26 ENCOUNTER — Other Ambulatory Visit: Payer: Self-pay

## 2019-06-26 DIAGNOSIS — Z1231 Encounter for screening mammogram for malignant neoplasm of breast: Secondary | ICD-10-CM | POA: Insufficient documentation

## 2019-06-28 DIAGNOSIS — F603 Borderline personality disorder: Secondary | ICD-10-CM | POA: Diagnosis not present

## 2019-06-28 DIAGNOSIS — F319 Bipolar disorder, unspecified: Secondary | ICD-10-CM | POA: Diagnosis not present

## 2019-06-29 ENCOUNTER — Ambulatory Visit: Payer: Self-pay

## 2019-06-29 ENCOUNTER — Ambulatory Visit: Payer: BC Managed Care – PPO

## 2019-07-04 DIAGNOSIS — Z20828 Contact with and (suspected) exposure to other viral communicable diseases: Secondary | ICD-10-CM | POA: Diagnosis not present

## 2019-07-09 ENCOUNTER — Ambulatory Visit
Admission: EM | Admit: 2019-07-09 | Discharge: 2019-07-09 | Disposition: A | Discharge status: Home / Self Care | Payer: BC Managed Care – PPO | Attending: Internal Medicine | Admitting: Internal Medicine

## 2019-07-09 ENCOUNTER — Other Ambulatory Visit: Payer: Self-pay

## 2019-07-09 DIAGNOSIS — Z888 Allergy status to other drugs, medicaments and biological substances status: Secondary | ICD-10-CM | POA: Diagnosis not present

## 2019-07-09 DIAGNOSIS — Z833 Family history of diabetes mellitus: Secondary | ICD-10-CM | POA: Insufficient documentation

## 2019-07-09 DIAGNOSIS — Z8249 Family history of ischemic heart disease and other diseases of the circulatory system: Secondary | ICD-10-CM | POA: Diagnosis not present

## 2019-07-09 DIAGNOSIS — Z7984 Long term (current) use of oral hypoglycemic drugs: Secondary | ICD-10-CM | POA: Insufficient documentation

## 2019-07-09 DIAGNOSIS — Z79899 Other long term (current) drug therapy: Secondary | ICD-10-CM | POA: Diagnosis not present

## 2019-07-09 DIAGNOSIS — Z87891 Personal history of nicotine dependence: Secondary | ICD-10-CM | POA: Diagnosis not present

## 2019-07-09 DIAGNOSIS — Z20828 Contact with and (suspected) exposure to other viral communicable diseases: Secondary | ICD-10-CM | POA: Insufficient documentation

## 2019-07-09 DIAGNOSIS — E559 Vitamin D deficiency, unspecified: Secondary | ICD-10-CM | POA: Diagnosis not present

## 2019-07-09 DIAGNOSIS — M791 Myalgia, unspecified site: Secondary | ICD-10-CM

## 2019-07-09 DIAGNOSIS — E119 Type 2 diabetes mellitus without complications: Secondary | ICD-10-CM | POA: Diagnosis not present

## 2019-07-09 DIAGNOSIS — R6889 Other general symptoms and signs: Secondary | ICD-10-CM | POA: Diagnosis not present

## 2019-07-09 DIAGNOSIS — K219 Gastro-esophageal reflux disease without esophagitis: Secondary | ICD-10-CM | POA: Diagnosis not present

## 2019-07-09 DIAGNOSIS — Z91040 Latex allergy status: Secondary | ICD-10-CM | POA: Insufficient documentation

## 2019-07-09 DIAGNOSIS — E039 Hypothyroidism, unspecified: Secondary | ICD-10-CM | POA: Insufficient documentation

## 2019-07-09 DIAGNOSIS — J029 Acute pharyngitis, unspecified: Secondary | ICD-10-CM

## 2019-07-09 DIAGNOSIS — R0981 Nasal congestion: Secondary | ICD-10-CM | POA: Diagnosis not present

## 2019-07-09 DIAGNOSIS — Z79890 Hormone replacement therapy (postmenopausal): Secondary | ICD-10-CM | POA: Diagnosis not present

## 2019-07-09 DIAGNOSIS — G4733 Obstructive sleep apnea (adult) (pediatric): Secondary | ICD-10-CM | POA: Diagnosis not present

## 2019-07-09 DIAGNOSIS — Z8349 Family history of other endocrine, nutritional and metabolic diseases: Secondary | ICD-10-CM | POA: Diagnosis not present

## 2019-07-09 DIAGNOSIS — I10 Essential (primary) hypertension: Secondary | ICD-10-CM | POA: Diagnosis not present

## 2019-07-09 MED ORDER — NAPROXEN 375 MG PO TABS: 375.0000 mg | ORAL_TABLET | Freq: Two times a day (BID) | ORAL | 0 refills | Status: DC

## 2019-07-09 MED ORDER — ONDANSETRON 4 MG PO TBDP: 4.0000 mg | ORAL_TABLET | Freq: Three times a day (TID) | ORAL | 0 refills | Status: DC | PRN

## 2019-07-09 NOTE — ED Triage Notes (Signed)
Patient complains of nausea that started yesterday. Reports that she was having sharp pain from low back to her neck that was causing her to feel nausea. States that the pain is still there today but nausea is some better.

## 2019-07-09 NOTE — ED Provider Notes (Signed)
[facility]    CSN: [id number] Arrival date & time: 07/09/19  0920      History   Chief Complaint Chief Complaint  Patient presents with  . Nausea    HPI Lori Knight is a 48 y.o. female with a history of diabetes mellitus type 2-suboptimally controlled, hypothyroidism-on levothyroxine comes to urgent care with complaints of nausea and back/neck pain.  Symptoms have been persistent over the past couple of days and got worse last night.  She denies having any emesis.  She had chills but no fever.  She has some runny nose and some throat discomfort.  Patient's family was exposed to Covid positive individual last week.  Patient was tested sometime last week which was about 3 days after exposure.  Test was negative at that time.  Symptoms continues to be persistent and her husband also has flulike symptoms.  Patient denies any shortness of breath.   HPI  Past Medical History:  Diagnosis Date  . Adult hypothyroidism 06/21/2012  . Bilateral polycystic ovarian syndrome 06/21/2012  . Biliary calculus with cholecystitis 08/14/2012  . Bipolar 1 disorder ([place])   . BP (high blood pressure) 02/14/2014  . Bronchitis   . Diabetes mellitus ([place]) 08/14/2012  . Diabetes mellitus without complication ([place])    pre-diabetic  . Dysrhythmia    wore heart monitor 2016. Corrected by changing Levothyroxine dose.  Marland Kitchen Heart murmur    followed by PCP-AS A CHILD-ASYMPTOMATIC  . Hyperprolactinemia ([place]) 06/21/2012  . Hypertension   . Hypoxia 04/17/2018  . Motion sickness    carnival rides  . PONV (postoperative nausea and vomiting)    WAKES UP CRYING  . Shortness of breath dyspnea    stairs. related to wt.  . Sleep apnea    has CPAP. has not used since 2011  . Vertigo    2x in last yr    Patient Active Problem List   Diagnosis Date Noted  . Gastroesophageal reflux disease without esophagitis 06/20/2019  . Elevated liver function tests 12/26/2018  . Elevated ferritin 12/20/2018  .  Plantar fasciitis 12/26/2017  . Cough in adult patient 12/26/2017  . Spasm of muscle of lower back 05/11/2017  . Fibroma of foot 03/03/2016  . Neuropathic pain of finger 03/03/2016  . Type II diabetes mellitus with complication ([place]) [id number]  . OSA on CPAP 09/22/2015  . Intermittent palpitations 08/28/2015  . Insomnia 08/28/2015  . Internal hemorrhoids 08/28/2015  . Vitamin D deficiency 08/28/2015  . Obesity, Class III, BMI 40-49.9 (morbid obesity) ([place]) 08/27/2015  . Essential hypertension 02/14/2014  . Benign paroxysmal positional nystagmus 02/14/2014  . 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 Bipolar 1 disorder ([place]) 08/14/2012  . Adult hypothyroidism 06/21/2012  . Bilateral polycystic ovarian syndrome 06/21/2012    Past Surgical History:  Procedure Laterality Date  . ABDOMINAL HYSTERECTOMY  2012   cervical dysplasia/ovaries remian  . CHOLECYSTECTOMY    . COLONOSCOPY WITH PROPOFOL N/A 06/19/2015   Procedure: COLONOSCOPY WITH PROPOFOL;  Surgeon: [name], MD;  Location: [facility];  Service: Endoscopy;  Laterality: N/A;  Diabetic - oral meds   . DENTAL SURGERY    . ETHMOIDECTOMY Bilateral 04/17/2018   Procedure: ETHMOIDECTOMY;  Surgeon: [name], MD;  Location: [facility];  Service: ENT;  Laterality: Bilateral;  . GALLBLADDER SURGERY    . IMAGE GUIDED SINUS SURGERY N/A 04/17/2018   Procedure: IMAGE GUIDED SINUS SURGERY;  Surgeon: [name], MD;  Location: [facility];  Service: ENT;  Laterality:  N/A;  . MAXILLARY ANTROSTOMY Bilateral 04/17/2018   Procedure: MAXILLARY ANTROSTOMY;  Surgeon: [name], MD;  Location: [facility];  Service: ENT;  Laterality: Bilateral;  . NASAL SEPTOPLASTY W/ TURBINOPLASTY Bilateral 04/17/2018   Procedure: NASAL SEPTOPLASTY WITH TURBINATE REDUCTION;  Surgeon: [name], MD;  Location: [facility];  Service: ENT;  Laterality: Bilateral;  . TRANSURETHRAL RESECTION OF BLADDER TUMOR N/A 04/17/2018   Procedure: NTRANSURETHRAL RESECTION  OF BLADDER TUMOR (TURBT) WITH GEMCITABINE;  Surgeon: [name], MD;  Location: [facility];  Service: Urology;  Laterality: N/A;    OB History   No obstetric history on file.      Home Medications    Prior to Admission medications   Medication Sig Start Date End Date Taking? Authorizing Provider  albuterol (VENTOLIN HFA) 108 (90 Base) MCG/ACT inhaler Inhale 2 puffs into the lungs every 4 (four) hours as needed for wheezing or shortness of breath. 11/23/18  Yes [name], MD  amLODipine (NORVASC) 5 MG tablet Take 1 tablet (5 mg total) by mouth daily. 06/20/19  Yes [name], MD  Ascorbic Acid (VITAMIN C) 1000 MG tablet Take 1,000 mg by mouth daily.   Yes [provider]  Cholecalciferol 5000 units capsule Take 1 capsule (5,000 Units total) by mouth daily. 11/28/15  Yes Plonk, Gwyndolyn Saxon, MD  CONTOUR NEXT TEST test strip USE TO CHECK BS UP TO 3 TIMES DAILY FOR DIABETES DX: E11.9 06/13/19  Yes [name], MD  Dulaglutide 1.5 MG/0.5ML SOPN Inject 1.5 mg into the skin once a week. 08/28/18  Yes [provider]  glipiZIDE (GLUCOTROL) 5 MG tablet Take 5 mg by mouth every morning. 06/04/19  Yes [provider]  lamoTRIgine (LAMICTAL) 150 MG tablet Take 300 mg by mouth daily. 05/15/19  Yes [provider]  levothyroxine (SYNTHROID, LEVOTHROID) 175 MCG tablet TAKE 1 TABLET (175 MCG TOTAL) BY MOUTH DAILY. 09/05/17  Yes [name], MD  losartan (COZAAR) 100 MG tablet TAKE 1/2 TABLET (50 MG TOTAL) BY MOUTH 2 (TWO) TIMES DAILY. 04/23/19  Yes [name], MD  metFORMIN (GLUCOPHAGE) 500 MG tablet Take 1 tablet (500 mg total) by mouth 3 (three) times daily. Patient taking differently: Take 1,000 mg by mouth 2 (two) times daily with a meal.  07/10/18  Yes [name], MD  Multiple Vitamin (MULTIVITAMIN) tablet Take 1 tablet by mouth daily.   Yes [provider]  Omega-3 Fatty Acids (FISH OIL) 1200 MG CAPS Take 1,200 mg by mouth  daily.    Yes [provider]  omeprazole (PRILOSEC) 40 MG capsule Take 1 capsule (40 mg total) by mouth daily. 05/30/19  Yes [name], MD  triamcinolone (NASACORT ALLERGY 24HR) 55 MCG/ACT AERO nasal inhaler Place 2 sprays into the nose daily.   Yes [provider]  naproxen (NAPROSYN) 375 MG tablet Take 1 tablet (375 mg total) by mouth 2 (two) times daily. 07/09/19   Chase Picket, MD  ondansetron (ZOFRAN ODT) 4 MG disintegrating tablet Take 1 tablet (4 mg total) by mouth every 8 (eight) hours as needed for nausea or vomiting. 07/09/19   Lucille Crichlow, Myrene Galas, MD    Family History Family History  Problem Relation Age of Onset  . Diabetes Maternal Grandmother   . Hypertension Maternal Grandmother   . Hyperlipidemia Maternal Grandmother   . Hypothyroidism Maternal Grandmother   . Heart disease Maternal Grandmother   . Diabetes Mother   . Hypertension Mother   . Hypothyroidism Mother   .  Bipolar disorder Mother   . Heart disease Mother   . Breast cancer Neg Hx     Social History Social History   Tobacco Use  . Smoking status: Former Smoker    Packs/day: 1.00    Years: 10.00    Pack years: 10.00    Types: Cigarettes    Quit date: 07/12/2002    Years since quitting: 17.0  . Smokeless tobacco: Never Used  . Tobacco comment: quit 2004  Substance Use Topics  . Alcohol use: Yes    Alcohol/week: 0.0 standard drinks    Comment: rarely  . Drug use: No     Allergies   Hctz [hydrochlorothiazide], Lisinopril, Latex, and Prednisone   Review of Systems Review of Systems  Constitutional: Negative for activity change, chills, fatigue and fever.  HENT: Positive for congestion and sore throat. Negative for hearing loss, postnasal drip, sinus pressure, sinus pain and voice change.   Respiratory: Negative for cough, chest tightness and shortness of breath.   Gastrointestinal: Positive for nausea. Negative for abdominal pain, diarrhea and vomiting.    Genitourinary: Negative for dysuria, frequency, hematuria and vaginal discharge.  Musculoskeletal: Positive for myalgias. Negative for arthralgias and joint swelling.  Skin: Negative for pallor and wound.  Neurological: Negative for dizziness, weakness and headaches.  Psychiatric/Behavioral: Negative.      Physical Exam Triage Vital Signs ED Triage Vitals  Enc Vitals Group     BP      Pulse      Resp      Temp      Temp src      SpO2      Weight      Height      Head Circumference      Peak Flow      Pain Score      Pain Loc      Pain Edu?      Excl. in [place]?    No data found.  Updated Vital Signs BP (!) 158/104 (BP Location: Left Arm)   Pulse 78   Temp 98.5 F (36.9 C) (Oral)   Resp 18   Ht 5\' 6"  (1.676 m)   Wt 127 kg   SpO2 97%   BMI 45.19 kg/m   Visual Acuity Right Eye Distance:   Left Eye Distance:   Bilateral Distance:    Right Eye Near:   Left Eye Near:    Bilateral Near:     Physical Exam Vitals and nursing note reviewed.  Constitutional:      General: She is not in acute distress.    Appearance: She is not ill-appearing.  HENT:     Right Ear: Tympanic membrane normal.     Left Ear: Tympanic membrane normal.     Mouth/Throat:     Mouth: Mucous membranes are moist.  Cardiovascular:     Rate and Rhythm: Normal rate and regular rhythm.     Pulses: Normal pulses.     Heart sounds: Normal heart sounds.  Pulmonary:     Effort: Pulmonary effort is normal. No respiratory distress.     Breath sounds: Normal breath sounds. No rhonchi or rales.  Abdominal:     General: Bowel sounds are normal. There is no distension.     Palpations: Abdomen is soft.     Tenderness: There is no guarding or rebound.  Musculoskeletal:        General: No swelling or signs of injury. Normal range of motion.  Cervical back: Normal range of motion and neck supple. No rigidity.  Lymphadenopathy:     Cervical: No cervical adenopathy.  Skin:    General: Skin is warm.      Capillary Refill: Capillary refill takes less than 2 seconds.     Findings: No bruising or erythema.  Neurological:     General: No focal deficit present.     Mental Status: She is alert and oriented to person, place, and time.      UC Treatments / Results  Labs (all labs ordered are listed, but only abnormal results are displayed) Labs Reviewed  NOVEL CORONAVIRUS, NAA  NOVEL CORONAVIRUS, NAA (HOSP ORDER, SEND-OUT TO REF LAB; TAT 18-24 HRS)    EKG   Radiology No results found.  Procedures Procedures (including critical care time)  Medications Ordered in UC Medications - No data to display  Initial Impression / Assessment and Plan / UC Course  I have reviewed the triage vital signs and the nursing notes.  Pertinent labs & imaging results that were available during my care of the patient were reviewed by me and considered in my medical decision making (see chart for details).     1.  Flulike symptoms in the setting of exposure to COVID-19 individual: COVID-19 PCR test sent Patient is advised to continue naproxen as needed for pain Zofran as needed for nausea/vomiting Patient to return if she has worsening symptoms. Final Clinical Impressions(s) / UC Diagnoses   Final diagnoses:  Flu-like symptoms   Discharge Instructions   None    ED Prescriptions    Medication Sig Dispense Auth. Provider   naproxen (NAPROSYN) 375 MG tablet Take 1 tablet (375 mg total) by mouth 2 (two) times daily. 20 tablet Sandford Diop, Myrene Galas, MD   ondansetron (ZOFRAN ODT) 4 MG disintegrating tablet Take 1 tablet (4 mg total) by mouth every 8 (eight) hours as needed for nausea or vomiting. 20 tablet Takiah Maiden, Myrene Galas, MD     PDMP not reviewed this encounter.   Chase Picket, MD 07/09/19 1101

## 2019-07-10 LAB — NOVEL CORONAVIRUS, NAA (HOSP ORDER, SEND-OUT TO REF LAB; TAT 18-24 HRS): SARS-CoV-2, NAA: NOT DETECTED

## 2019-07-12 ENCOUNTER — Ambulatory Visit: Payer: Self-pay

## 2019-07-12 ENCOUNTER — Ambulatory Visit: Payer: BC Managed Care – PPO

## 2019-07-17 ENCOUNTER — Ambulatory Visit: Payer: BC Managed Care – PPO | Admitting: Urology

## 2019-07-17 ENCOUNTER — Other Ambulatory Visit: Payer: Self-pay

## 2019-07-17 DIAGNOSIS — N3946 Mixed incontinence: Secondary | ICD-10-CM | POA: Diagnosis not present

## 2019-07-17 DIAGNOSIS — E039 Hypothyroidism, unspecified: Secondary | ICD-10-CM | POA: Diagnosis not present

## 2019-07-17 NOTE — Progress Notes (Signed)
PTNS  Session # 1  Health & Social Factors: N/A Caffeine: 1 Alcohol: 0 Daytime voids #per day: 8 Night-time voids #per night: 4-7 Urgency: Mild Incontinence Episodes #per day: Pt unsure as she wears depends will track going forward. Uses 2 depends daily  Ankle used: Right Treatment Setting: 4 Feeling/ Response: Sensory  Comments: Pt signed PTNS consent form, form scanned in. Pt given voiding diary and pt education.   Preformed By: Gordy Clement, CMA   Follow Up: RTC in 1 week for PTNS #2

## 2019-07-20 ENCOUNTER — Ambulatory Visit: Payer: Self-pay | Admitting: Physician Assistant

## 2019-07-20 ENCOUNTER — Ambulatory Visit: Payer: BC Managed Care – PPO

## 2019-07-23 DIAGNOSIS — G4733 Obstructive sleep apnea (adult) (pediatric): Secondary | ICD-10-CM | POA: Diagnosis not present

## 2019-07-24 ENCOUNTER — Other Ambulatory Visit: Payer: Self-pay

## 2019-07-24 ENCOUNTER — Ambulatory Visit: Payer: BC Managed Care – PPO | Admitting: Urology

## 2019-07-24 DIAGNOSIS — N3946 Mixed incontinence: Secondary | ICD-10-CM | POA: Diagnosis not present

## 2019-07-24 NOTE — Progress Notes (Signed)
PTNS  Session # 2  Health & Social Factors: Pt has a new CPAP has noticed great improvement in symptoms. Caffeine: 1 Alcohol: 0 Daytime voids #per day: 4 Night-time voids #per night: 0 Urgency: Strong Incontinence Episodes #per day: 0 Ankle used: Left Treatment Setting: 9 Feeling/ Response: Sensory  Comments: N/A  Preformed By: Gordy Clement, CMA  Follow Up: RTC as scheduled

## 2019-07-25 DIAGNOSIS — F603 Borderline personality disorder: Secondary | ICD-10-CM | POA: Diagnosis not present

## 2019-07-25 DIAGNOSIS — F319 Bipolar disorder, unspecified: Secondary | ICD-10-CM | POA: Diagnosis not present

## 2019-07-27 ENCOUNTER — Ambulatory Visit: Payer: Self-pay | Admitting: Physician Assistant

## 2019-07-27 ENCOUNTER — Ambulatory Visit: Payer: BC Managed Care – PPO

## 2019-07-31 ENCOUNTER — Ambulatory Visit: Payer: Self-pay | Admitting: Urology

## 2019-08-03 ENCOUNTER — Ambulatory Visit: Payer: Self-pay | Admitting: Physician Assistant

## 2019-08-03 ENCOUNTER — Ambulatory Visit: Payer: BC Managed Care – PPO

## 2019-08-04 ENCOUNTER — Other Ambulatory Visit: Payer: Self-pay

## 2019-08-04 ENCOUNTER — Ambulatory Visit
Admission: EM | Admit: 2019-08-04 | Discharge: 2019-08-04 | Disposition: A | Discharge status: Home / Self Care | Payer: BC Managed Care – PPO | Attending: Family Medicine | Admitting: Family Medicine

## 2019-08-04 ENCOUNTER — Encounter: Payer: Self-pay | Admitting: Emergency Medicine

## 2019-08-04 ENCOUNTER — Ambulatory Visit (INDEPENDENT_AMBULATORY_CARE_PROVIDER_SITE_OTHER): Payer: BC Managed Care – PPO

## 2019-08-04 DIAGNOSIS — Z7189 Other specified counseling: Secondary | ICD-10-CM

## 2019-08-04 DIAGNOSIS — R05 Cough: Secondary | ICD-10-CM

## 2019-08-04 DIAGNOSIS — J069 Acute upper respiratory infection, unspecified: Secondary | ICD-10-CM | POA: Diagnosis not present

## 2019-08-04 DIAGNOSIS — Z20822 Contact with and (suspected) exposure to covid-19: Secondary | ICD-10-CM

## 2019-08-04 MED ORDER — BENZONATATE 200 MG PO CAPS: 200.0000 mg | ORAL_CAPSULE | Freq: Three times a day (TID) | ORAL | 0 refills | Status: DC | PRN

## 2019-08-04 NOTE — ED Triage Notes (Signed)
Pt c/o cough. Started about 2 weeks ago. This week she started having runny nose, congestion and runny nose. Denies fever. She had body aches and chills last week but has resolved.

## 2019-08-04 NOTE — Discharge Instructions (Signed)
It was very nice seeing you today in clinic. Thank you for entrusting me with your care.   Rest and stay hydrated. Use cough medication as needed for cough.   You were tested for SARS-CoV-2 (novel coronavirus) today. Testing is performed by an outside lab ([facility]) and has variable turn around times ranging between 24-48 hours. Current recommendations from the the CDC and [place] DHHS require that you remain out of work in order to quarantine at home until negative test results are have been received. In the event that your test results are positive, you will be contacted with further directives. These measures are being implemented out of an abundance of caution to prevent transmission and spread during the current SARS-CoV-2 pandemic.  Make arrangements to follow up with your regular doctor in 1 week for re-evaluation if not improving. If your symptoms/condition worsens, please seek follow up care either here or in the ER. Please remember, our [facility] providers are "right here with you" when you need [place].   Again, it was my pleasure to take care of you today. Thank you for choosing our clinic. I hope that you start to feel better quickly.   Honor Loh, MSN, APRN, FNP-C, CEN Advanced Practice Provider [facility]

## 2019-08-04 NOTE — ED Provider Notes (Addendum)
[place], [place]   Name: Lori Knight DOB: November 03, 1970 MRN: [id number] CSN: [id number] PCP: [name], MD  Arrival date and time:  08/04/19 1034  Chief Complaint:  Cough   NOTE: Prior to seeing the patient today, I have reviewed the triage nursing documentation and vital signs. Clinical staff has updated patient's PMH/PSHx, current medication list, and drug allergies/intolerances to ensure comprehensive history available to assist in medical decision making.   History:   HPI: Lori Knight is a 49 y.o. female who presents today with complaints of worsening cough that started approximately 2 weeks ago. She initially experienced some concurrent diffuse myalgias and chills, however notes that these symptoms have resolved at this point. Earlier this week, patient developed congestion and rhinorrhea. Patient denies fevers. Cough has been non-productive with no associated wheezing. Patient has chronic issues with exertional dyspnea related to her weight per her report. Cough is reported to be worse at night. Patient advising that she has been coughing so hard at times that it has caused her to have episodes of urinary stress incontinence. Patient is a former smoker. She denies a PMH of any underlying lung disease; no asthma or COPD. She does have seasonal allergies for which she uses daily TAC nasal spray. She denies that she has experienced any nausea, vomiting, diarrhea, or abdominal pain. She is eating and drinking well. Patient denies any perceived alterations to her sense of taste or smell. Patient presents out of concerns for her personal health after being exposed to her step-son who tested positive for SARS-CoV-2 (novel coronavirus) on 07/09/2019. She has not been tested for SARS-CoV-2 (novel coronavirus) in the past 14 days; last tested negative on 07/09/2019 per her report. Patient has not been vaccinated for influenza this season. Despite her symptoms, patient has not taken any over  the counter interventions to help improve/relieve her reported symptoms at home.   Past Medical History:  Diagnosis Date  . Adult hypothyroidism 06/21/2012  . Bilateral polycystic ovarian syndrome 06/21/2012  . Biliary calculus with cholecystitis 08/14/2012  . Bipolar 1 disorder ([place])   . BP (high blood pressure) 02/14/2014  . Bronchitis   . Diabetes mellitus ([place]) 08/14/2012  . Diabetes mellitus without complication ([place])    pre-diabetic  . Dysrhythmia    wore heart monitor 2016. Corrected by changing Levothyroxine dose.  Marland Kitchen Heart murmur    followed by PCP-AS A CHILD-ASYMPTOMATIC  . Hyperprolactinemia ([place]) 06/21/2012  . Hypertension   . Hypoxia 04/17/2018  . Motion sickness    carnival rides  . PONV (postoperative nausea and vomiting)    WAKES UP CRYING  . Shortness of breath dyspnea    stairs. related to wt.  . Sleep apnea    has CPAP. has not used since 2011  . Vertigo    2x in last yr    Past Surgical History:  Procedure Laterality Date  . ABDOMINAL HYSTERECTOMY  2012   cervical dysplasia/ovaries remian  . CHOLECYSTECTOMY    . COLONOSCOPY WITH PROPOFOL N/A 06/19/2015   Procedure: COLONOSCOPY WITH PROPOFOL;  Surgeon: [name], MD;  Location: [facility];  Service: Endoscopy;  Laterality: N/A;  Diabetic - oral meds   . DENTAL SURGERY    . ETHMOIDECTOMY Bilateral 04/17/2018   Procedure: ETHMOIDECTOMY;  Surgeon: [name], MD;  Location: [facility];  Service: ENT;  Laterality: Bilateral;  . GALLBLADDER SURGERY    . IMAGE GUIDED SINUS SURGERY N/A 04/17/2018   Procedure: IMAGE GUIDED SINUS SURGERY;  Surgeon: Kathyrn Sheriff,  [name], MD;  Location: [facility];  Service: ENT;  Laterality: N/A;  . MAXILLARY ANTROSTOMY Bilateral 04/17/2018   Procedure: MAXILLARY ANTROSTOMY;  Surgeon: [name], MD;  Location: [facility];  Service: ENT;  Laterality: Bilateral;  . NASAL SEPTOPLASTY W/ TURBINOPLASTY Bilateral 04/17/2018   Procedure: NASAL SEPTOPLASTY WITH TURBINATE REDUCTION;  Surgeon:  [name], MD;  Location: [facility];  Service: ENT;  Laterality: Bilateral;  . TRANSURETHRAL RESECTION OF BLADDER TUMOR N/A 04/17/2018   Procedure: NTRANSURETHRAL RESECTION OF BLADDER TUMOR (TURBT) WITH GEMCITABINE;  Surgeon: [name], MD;  Location: [facility];  Service: Urology;  Laterality: N/A;    Family History  Problem Relation Age of Onset  . Diabetes Maternal Grandmother   . Hypertension Maternal Grandmother   . Hyperlipidemia Maternal Grandmother   . Hypothyroidism Maternal Grandmother   . Heart disease Maternal Grandmother   . Diabetes Mother   . Hypertension Mother   . Hypothyroidism Mother   . Bipolar disorder Mother   . Heart disease Mother   . Breast cancer Neg Hx     Social History   Tobacco Use  . Smoking status: Former Smoker    Packs/day: 1.00    Years: 10.00    Pack years: 10.00    Types: Cigarettes    Quit date: 07/12/2002    Years since quitting: 17.0  . Smokeless tobacco: Never Used  . Tobacco comment: quit 2004  Substance Use Topics  . Alcohol use: Yes    Alcohol/week: 0.0 standard drinks    Comment: rarely  . Drug use: No    Patient Active Problem List   Diagnosis Date Noted  . Gastroesophageal reflux disease without esophagitis 06/20/2019  . Elevated liver function tests 12/26/2018  . Elevated ferritin 12/20/2018  . Plantar fasciitis 12/26/2017  . Cough in adult patient 12/26/2017  . Spasm of muscle of lower back 05/11/2017  . Fibroma of foot 03/03/2016  . Neuropathic pain of finger 03/03/2016  . Type II diabetes mellitus with complication ([place]) [id number]  . OSA on CPAP 09/22/2015  . Intermittent palpitations 08/28/2015  . Insomnia 08/28/2015  . Internal hemorrhoids 08/28/2015  . Vitamin D deficiency 08/28/2015  . Obesity, Class III, BMI 40-49.9 (morbid obesity) ([place]) 08/27/2015  . Essential hypertension 02/14/2014  . Benign paroxysmal positional nystagmus 02/14/2014  . 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 Bipolar 1 disorder  ([place]) 08/14/2012  . Adult hypothyroidism 06/21/2012  . Bilateral polycystic ovarian syndrome 06/21/2012    Home Medications:    Current Meds  Medication Sig  . amLODipine (NORVASC) 5 MG tablet Take 1 tablet (5 mg total) by mouth daily.  . Ascorbic Acid (VITAMIN C) 1000 MG tablet Take 1,000 mg by mouth daily.  . Cholecalciferol 5000 units capsule Take 1 capsule (5,000 Units total) by mouth daily.  Marland Kitchen glipiZIDE (GLUCOTROL) 5 MG tablet Take 5 mg by mouth every morning.  . lamoTRIgine (LAMICTAL) 150 MG tablet Take 300 mg by mouth daily.  Marland Kitchen levothyroxine (SYNTHROID, LEVOTHROID) 175 MCG tablet TAKE 1 TABLET (175 MCG TOTAL) BY MOUTH DAILY.  Marland Kitchen losartan (COZAAR) 100 MG tablet TAKE 1/2 TABLET (50 MG TOTAL) BY MOUTH 2 (TWO) TIMES DAILY.  . metFORMIN (GLUCOPHAGE) 500 MG tablet Take 1 tablet (500 mg total) by mouth 3 (three) times daily. (Patient taking differently: Take 1,000 mg by mouth 2 (two) times daily with a meal. )  . Multiple Vitamin (MULTIVITAMIN) tablet Take 1 tablet by mouth daily.  . naproxen (NAPROSYN) 375 MG tablet Take 1 tablet (375  mg total) by mouth 2 (two) times daily.  . Omega-3 Fatty Acids (FISH OIL) 1200 MG CAPS Take 1,200 mg by mouth daily.   Marland Kitchen omeprazole (PRILOSEC) 40 MG capsule Take 1 capsule (40 mg total) by mouth daily.  Marland Kitchen triamcinolone (NASACORT ALLERGY 24HR) 55 MCG/ACT AERO nasal inhaler Place 2 sprays into the nose daily.    Allergies:   Hctz [hydrochlorothiazide], Lisinopril, Latex, and Prednisone  Review of Systems (ROS): Review of Systems  Constitutional: Positive for chills (last week; resolved). Negative for fatigue and fever.  HENT: Positive for congestion, postnasal drip and rhinorrhea. Negative for ear pain, sinus pressure, sinus pain, sneezing and sore throat.   Eyes: Negative for pain, discharge and redness.  Respiratory: Positive for cough and shortness of breath (chronic exertional dyspnea 2/2 weight). Negative for chest tightness and wheezing.     Cardiovascular: Negative for chest pain and palpitations.  Gastrointestinal: Negative for abdominal pain, diarrhea, nausea and vomiting.  Endocrine:       PMH (+) for diabetes and HYPOthyroidism  Genitourinary:       (+) stress urinary incontinence  Musculoskeletal: Positive for myalgias (last week; resolved). Negative for arthralgias, back pain and neck pain.  Skin: Negative for color change, pallor and rash.  Allergic/Immunologic: Positive for environmental allergies.  Neurological: Negative for dizziness, syncope, weakness and headaches.  Hematological: Negative for adenopathy.     Vital Signs: Today's Vitals   08/04/19 1058 08/04/19 1102 08/04/19 1159  BP:  (!) 157/92   Pulse:  66   Resp:  20   Temp:  98.4 F (36.9 C)   TempSrc:  Oral   SpO2:  96%   Weight: 280 lb (127 kg)    Height: 5\' 6"  (1.676 m)    PainSc: 0-No pain  0-No pain    Physical Exam: Physical Exam  Constitutional: She is oriented to person, place, and time and well-developed, well-nourished, and in no distress.  HENT:  Head: Normocephalic and atraumatic.  Right Ear: Tympanic membrane normal.  Left Ear: Tympanic membrane normal.  Nose: Rhinorrhea present. No mucosal edema or sinus tenderness.  Mouth/Throat: Uvula is midline and mucous membranes are normal. Posterior oropharyngeal erythema (+) clear PND present. No oropharyngeal exudate or posterior oropharyngeal edema.  Eyes: Pupils are equal, round, and reactive to light.  Cardiovascular: Normal rate, regular rhythm, normal heart sounds and intact distal pulses.  Pulmonary/Chest: Effort normal. She has rhonchi (upper airways; clears with cough).  Moderate cough noted in clinic. No SOB or increased WOB. No distress. Able to speak in complete sentences without difficulties. SPO2 96% on RA.  Neurological: She is alert and oriented to person, place, and time. Gait normal.  Skin: Skin is warm and dry. No rash noted. She is not diaphoretic.  Psychiatric:  Mood, memory, affect and judgment normal.  Nursing note and vitals reviewed.   Urgent Care Treatments / Results:   Orders Placed This Encounter  Procedures  . Novel Coronavirus, NAA (Hosp order, Send-out to Ref Lab; TAT 18-24 hrs  . DG Chest 2 View    LABS: PLEASE NOTE: all labs that were ordered this encounter are listed, however only abnormal results are displayed. Labs Reviewed  NOVEL CORONAVIRUS, NAA (HOSP ORDER, SEND-OUT TO REF LAB; TAT 18-24 HRS)    EKG: -None  RADIOLOGY: DG Chest 2 View  Result Date: 08/04/2019 CLINICAL DATA:  Worsening cough for 2 weeks EXAM: CHEST - 2 VIEW COMPARISON:  06/10/2016 FINDINGS: The heart size and mediastinal contours are within normal limits.  Both lungs are clear. The visualized skeletal structures are unremarkable. IMPRESSION: No active cardiopulmonary disease. Electronically Signed   By: Kathreen Devoid   On: 08/04/2019 11:50    PROCEDURES: Procedures  MEDICATIONS RECEIVED THIS VISIT: Medications - No data to display  PERTINENT CLINICAL COURSE NOTES/UPDATES:   Initial Impression / Assessment and Plan / Urgent Care Course:  Pertinent labs & imaging results that were available during my care of the patient were personally reviewed by me and considered in my medical decision making (see lab/imaging section of note for values and interpretations).  Lori Knight is a 49 y.o. female who presents to [facility] today with complaints of Cough  Patient overall well appearing and in no acute distress today in clinic. Presenting symptoms (see HPI) and exam as documented above. She presents with symptoms associated with SARS-CoV-2 (novel coronavirus) following direct exposure. Patient tested negative for the virus on 07/09/2019. Discussed typical symptom constellation. Reviewed potential for infection and need for repeat testing. Patient amenable to being tested. SARS-CoV-2 swab collected by certified clinical staff. Discussed variable turn  around times associated with testing, as swabs are being processed at [facility], and have been taking between 24-48 hours to come back. She was advised to self quarantine, per [facility] DHHS guidelines, until negative results received. These measures are being implemented out of an abundance of caution to prevent transmission and spread during the current SARS-CoV-2 pandemic.  Radiographs of the chest performed today revealed no acute cardiopulmonary process; no evidence of peribronchial thickening, areas of consolidation, or focal infiltrates. Presenting symptoms consistent with acute viral illness. Until ruled out with confirmatory lab testing, SARS-CoV-2 remains part of the differential. Her testing is pending at this time. I discussed with her that her symptoms are felt to be viral in nature, thus antibiotics would not offer her any relief or improve his symptoms any faster than conservative symptomatic management. Discussed supportive care measures at home during acute phase of illness. Will send in a supply of benzonatate (Tessalon) for patient to use on a PRN basis to help with her cough. Patient to rest as much as possible. She was encouraged to ensure adequate hydration (water and ORS) to prevent dehydration and electrolyte derangements. Patient may use APAP and/or IBU on an as needed basis for pain/fever. Patient has multiple co-morbidities that increase her SARS-CoV-2 morbidity and mortality risk including HTN, T2DM, HLD, chronic bronchitis, OSAH, and Body mass index is 45.19 kg/m. If patient found to be positive for SARS-CoV-2, her co-morbidities should qualify her for the monoclonal antibody (bamlanivimab). Outpatient COVID response team to determine eligibility and contact the patient to discuss if she is deemed to be eligible for the infusion treatment.  Current clinical condition warrants patient being out of work in order to quarantine while waiting for testing results. She was provided with the  appropriate documentation to provide to her place of employment that will allow for her to RTW on 08/07/2019 with no restrictions. RTW is contingent on her SARS-CoV-2 test results being reviewed as negative.   Discussed follow up with primary care physician in 1 week for re-evaluation. I have reviewed the follow up and strict return precautions for any new or worsening symptoms. Patient is aware of symptoms that would be deemed urgent/emergent, and would thus require further evaluation either here or in the emergency department. At the time of discharge, she verbalized understanding and consent with the discharge plan as it was reviewed with her. All questions were fielded by provider  and/or clinic staff prior to patient discharge.    Final Clinical Impressions / Urgent Care Diagnoses:   Final diagnoses:  Viral URI with cough  Encounter for laboratory testing for COVID-19 virus  Advice given about COVID-19 virus infection    New Prescriptions:  [place] Controlled Substance Registry consulted? Not Applicable  Meds ordered this encounter  Medications  . benzonatate (TESSALON) 200 MG capsule    Sig: Take 1 capsule (200 mg total) by mouth 3 (three) times daily as needed for cough.    Dispense:  21 capsule    Refill:  0    Recommended Follow up Care:  Patient encouraged to follow up with the following provider within the specified time frame, or sooner as dictated by the severity of her symptoms. As always, she was instructed that for any urgent/emergent care needs, she should seek care either here or in the emergency department for more immediate evaluation.  Follow-up Information    [name], MD In 1 week.   Specialty: Internal Medicine Why: General reassessment of symptoms if not improving Contact information: [address] [place] [address] [phone]         NOTE: This note was prepared using Dragon dictation software along with smaller phrase technology.  Despite my best ability to proofread, there is the potential that transcriptional errors may still occur from this process, and are completely unintentional.    Karen Kitchens, NP 08/04/19 2329

## 2019-08-05 LAB — NOVEL CORONAVIRUS, NAA (HOSP ORDER, SEND-OUT TO REF LAB; TAT 18-24 HRS): SARS-CoV-2, NAA: NOT DETECTED

## 2019-08-07 ENCOUNTER — Ambulatory Visit: Payer: Self-pay | Admitting: Urology

## 2019-08-10 ENCOUNTER — Ambulatory Visit: Payer: Self-pay | Admitting: Physician Assistant

## 2019-08-10 ENCOUNTER — Ambulatory Visit: Payer: BC Managed Care – PPO

## 2019-08-13 ENCOUNTER — Encounter: Payer: Self-pay | Admitting: Internal Medicine

## 2019-08-13 ENCOUNTER — Other Ambulatory Visit: Payer: Self-pay

## 2019-08-13 ENCOUNTER — Ambulatory Visit: Payer: BC Managed Care – PPO | Admitting: Internal Medicine

## 2019-08-13 VITALS — BP 128/90 | HR 87 | Temp 99.0°F | Ht 66.0 in | Wt 294.0 lb

## 2019-08-13 DIAGNOSIS — E1169 Type 2 diabetes mellitus with other specified complication: Secondary | ICD-10-CM | POA: Insufficient documentation

## 2019-08-13 DIAGNOSIS — F319 Bipolar disorder, unspecified: Secondary | ICD-10-CM

## 2019-08-13 DIAGNOSIS — I10 Essential (primary) hypertension: Secondary | ICD-10-CM

## 2019-08-13 DIAGNOSIS — E785 Hyperlipidemia, unspecified: Secondary | ICD-10-CM | POA: Diagnosis not present

## 2019-08-13 DIAGNOSIS — E118 Type 2 diabetes mellitus with unspecified complications: Secondary | ICD-10-CM

## 2019-08-13 NOTE — Progress Notes (Signed)
Date:  08/13/2019   Name:  Lori Knight   DOB:  12/11/1970   MRN:  [id number]   Chief Complaint: Hypertension (Follow up.) Pt will be losing insurance at the end of March.  She may need to come back here for her DM medications.  Also may have issues with psych care.  Currently only taking Lamictal and doing well. Hypertension This is a chronic problem. The problem is controlled (has not started checking at home). Pertinent negatives include no chest pain, headaches, palpitations or shortness of breath. There are no associated agents to hypertension. Past treatments include calcium channel blockers and angiotensin blockers. The current treatment provides significant improvement. There are no compliance problems (has also started walking regularly).   Diabetes She presents for her follow-up diabetic visit. She has type 2 diabetes mellitus. Her disease course has been improving. Pertinent negatives for hypoglycemia include no dizziness, headaches or nervousness/anxiousness. Pertinent negatives for diabetes include no chest pain and no fatigue. Symptoms are stable. Current diabetic treatments: metformin, glimepiride and Trulicity. She is compliant with treatment all of the time. An ACE inhibitor/angiotensin II receptor blocker is being taken.  Hyperlipidemia This is a chronic problem. Condition status: LDL too high in setting of DM. Pertinent negatives include no chest pain or shortness of breath. She is currently on no antihyperlipidemic treatment.  Bipolar - on Lamictal and doing well.  Has been trying to get established with a new Psych and has appointment in the near future but won't be able to continue with them when her insurance stops due to cost.  Immunization History  Administered Date(s) Administered  . Pneumococcal Polysaccharide-23 05/09/2014     Lab Results  Component Value Date   CREATININE 0.8 05/31/2019   BUN 9 05/31/2019   NA 139 05/31/2019   K 4.3 05/31/2019   CL 102  05/31/2019   CO2 29 (A) 05/31/2019   Lab Results  Component Value Date   CHOL 204 (A) 05/31/2019   CHOL 204 (A) 05/31/2019   HDL 44 05/31/2019   HDL 44 05/31/2019   LDLCALC 121 05/31/2019   LDLCALC 121 05/31/2019   TRIG 196 (A) 05/31/2019   TRIG 196 (A) 05/31/2019   Lab Results  Component Value Date   TSH 5.30 05/31/2019   TSH 5.38 05/31/2019   Lab Results  Component Value Date   HGBA1C 7.8 05/31/2019   HGBA1C 7.8 05/31/2019     Review of Systems  Constitutional: Negative for chills, fatigue and fever.  HENT: Negative for trouble swallowing.   Respiratory: Negative for chest tightness, shortness of breath and wheezing.   Cardiovascular: Positive for leg swelling. Negative for chest pain and palpitations.  Gastrointestinal: Negative for abdominal pain.  Skin: Negative for color change and rash.  Neurological: Negative for dizziness, light-headedness and headaches.  Psychiatric/Behavioral: Negative for dysphoric mood and sleep disturbance. The patient is not nervous/anxious.     Patient Active Problem List   Diagnosis Date Noted  . Gastroesophageal reflux disease without esophagitis 06/20/2019  . Elevated liver function tests 12/26/2018  . Elevated ferritin 12/20/2018  . Plantar fasciitis 12/26/2017  . Cough in adult patient 12/26/2017  . Spasm of muscle of lower back 05/11/2017  . Fibroma of foot 03/03/2016  . Neuropathic pain of finger 03/03/2016  . Type II diabetes mellitus with complication ([place]) [id number]  . OSA on CPAP 09/22/2015  . Intermittent palpitations 08/28/2015  . Insomnia 08/28/2015  . Internal hemorrhoids 08/28/2015  . Vitamin D deficiency 08/28/2015  .  Obesity, Class III, BMI 40-49.9 (morbid obesity) ([place]) 08/27/2015  . Essential hypertension 02/14/2014  . Benign paroxysmal positional nystagmus 02/14/2014  . 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 Bipolar 1 disorder ([place]) 08/14/2012  . Adult hypothyroidism 06/21/2012  . Bilateral polycystic  ovarian syndrome 06/21/2012    Allergies  Allergen Reactions  . Hctz [Hydrochlorothiazide] Cough  . Lisinopril Cough  . Latex Rash    Condoms only  . Prednisone Anxiety    Paranoia    Past Surgical History:  Procedure Laterality Date  . ABDOMINAL HYSTERECTOMY  2012   cervical dysplasia/ovaries remian  . CHOLECYSTECTOMY    . COLONOSCOPY WITH PROPOFOL N/A 06/19/2015   Procedure: COLONOSCOPY WITH PROPOFOL;  Surgeon: [name], MD;  Location: [place];  Service: Endoscopy;  Laterality: N/A;  Diabetic - oral meds   . DENTAL SURGERY    . ETHMOIDECTOMY Bilateral 04/17/2018   Procedure: ETHMOIDECTOMY;  Surgeon: [name], MD;  Location: [facility];  Service: ENT;  Laterality: Bilateral;  . GALLBLADDER SURGERY    . IMAGE GUIDED SINUS SURGERY N/A 04/17/2018   Procedure: IMAGE GUIDED SINUS SURGERY;  Surgeon: [name], MD;  Location: [facility];  Service: ENT;  Laterality: N/A;  . MAXILLARY ANTROSTOMY Bilateral 04/17/2018   Procedure: MAXILLARY ANTROSTOMY;  Surgeon: [name], MD;  Location: [facility];  Service: ENT;  Laterality: Bilateral;  . NASAL SEPTOPLASTY W/ TURBINOPLASTY Bilateral 04/17/2018   Procedure: NASAL SEPTOPLASTY WITH TURBINATE REDUCTION;  Surgeon: [name], MD;  Location: [facility];  Service: ENT;  Laterality: Bilateral;  . TRANSURETHRAL RESECTION OF BLADDER TUMOR N/A 04/17/2018   Procedure: NTRANSURETHRAL RESECTION OF BLADDER TUMOR (TURBT) WITH GEMCITABINE;  Surgeon: [name], MD;  Location: [facility];  Service: Urology;  Laterality: N/A;    Social History   Tobacco Use  . Smoking status: Former Smoker    Packs/day: 1.00    Years: 10.00    Pack years: 10.00    Types: Cigarettes    Quit date: 07/12/2002    Years since quitting: 17.0  . Smokeless tobacco: Never Used  . Tobacco comment: quit 2004  Substance Use Topics  . Alcohol use: Yes    Alcohol/week: 0.0 standard drinks    Comment: rarely  . Drug use: No     Medication list has been  reviewed and updated.  Current Meds  Medication Sig  . amLODipine (NORVASC) 5 MG tablet Take 1 tablet (5 mg total) by mouth daily.  . Ascorbic Acid (VITAMIN C) 1000 MG tablet Take 1,000 mg by mouth daily.  . benzonatate (TESSALON) 200 MG capsule Take 1 capsule (200 mg total) by mouth 3 (three) times daily as needed for cough.  . Cholecalciferol 5000 units capsule Take 1 capsule (5,000 Units total) by mouth daily.  . CONTOUR NEXT TEST test strip USE TO CHECK BS UP TO 3 TIMES DAILY FOR DIABETES DX: E11.9  . Dulaglutide 1.5 MG/0.5ML SOPN Inject 1.5 mg into the skin once a week.  Marland Kitchen glipiZIDE (GLUCOTROL) 5 MG tablet Take 5 mg by mouth every morning.  . lamoTRIgine (LAMICTAL) 150 MG tablet Take 300 mg by mouth daily.  Marland Kitchen levothyroxine (SYNTHROID, LEVOTHROID) 175 MCG tablet TAKE 1 TABLET (175 MCG TOTAL) BY MOUTH DAILY.  Marland Kitchen losartan (COZAAR) 100 MG tablet TAKE 1/2 TABLET (50 MG TOTAL) BY MOUTH 2 (TWO) TIMES DAILY.  . metFORMIN (GLUCOPHAGE) 500 MG tablet Take 1 tablet (500 mg total) by mouth 3 (three) times daily. (Patient taking differently: Take 1,000 mg by mouth 2 (  two) times daily with a meal. )  . Multiple Vitamin (MULTIVITAMIN) tablet Take 1 tablet by mouth daily.  . naproxen (NAPROSYN) 375 MG tablet Take 1 tablet (375 mg total) by mouth 2 (two) times daily.  . Omega-3 Fatty Acids (FISH OIL) 1200 MG CAPS Take 1,200 mg by mouth daily.   Marland Kitchen omeprazole (PRILOSEC) 40 MG capsule Take 1 capsule (40 mg total) by mouth daily.  . ondansetron (ZOFRAN ODT) 4 MG disintegrating tablet Take 1 tablet (4 mg total) by mouth every 8 (eight) hours as needed for nausea or vomiting.  . triamcinolone (NASACORT ALLERGY 24HR) 55 MCG/ACT AERO nasal inhaler Place 2 sprays into the nose daily.    PHQ 2/9 Scores 08/13/2019 06/20/2019 09/15/2018 05/05/2018  PHQ - 2 Score 0 2 2 4   PHQ- 9 Score 2 5 2 9   Exception Documentation - - - -  Not completed - - - -    BP Readings from Last 3 Encounters:  08/13/19 128/90  08/04/19 (!)  157/92  07/09/19 (!) 158/104    Physical Exam Vitals and nursing note reviewed.  Constitutional:      General: She is not in acute distress.    Appearance: She is well-developed.  HENT:     Head: Normocephalic and atraumatic.  Cardiovascular:     Rate and Rhythm: Normal rate and regular rhythm.     Pulses: Normal pulses.     Heart sounds: No murmur.  Pulmonary:     Effort: Pulmonary effort is normal. No respiratory distress.     Breath sounds: No wheezing or rhonchi.  Musculoskeletal:     Cervical back: Normal range of motion.     Right lower leg: Edema present.     Left lower leg: Edema (1+ edema both lower legs) present.  Lymphadenopathy:     Cervical: No cervical adenopathy.  Skin:    General: Skin is warm and dry.     Findings: No rash.  Neurological:     General: No focal deficit present.     Mental Status: She is alert and oriented to person, place, and time.  Psychiatric:        Behavior: Behavior normal.        Thought Content: Thought content normal.     Wt Readings from Last 3 Encounters:  08/13/19 294 lb (133.4 kg)  08/04/19 280 lb (127 kg)  07/09/19 280 lb (127 kg)    BP 128/90   Pulse 87   Temp 99 F (37.2 C) (Oral)   Ht 5\' 6"  (1.676 m)   Wt 294 lb (133.4 kg)   SpO2 98%   BMI 47.45 kg/m   Assessment and Plan: 1. Essential hypertension Clinically stable exam with well controlled BP on amlodipine and losartan. Tolerating medications without side effects at this time other than mild asymptomatic edema May need to change regimen or add diuretic Pt to continue current regimen and low sodium diet; benefits of regular exercise as able discussed.  2. Type II diabetes mellitus with complication ([place]) Clinically stable by exam and report without s/s of hypoglycemia. DM complicated by lipids and HTN. Tolerating medications - Trulicity, glimepiride and metformin -  well without side effects or other concerns.  3. Mild hyperlipidemia Cholesterol not at  goal of <70 Has never been on statin medication - discussed today and pt would like to defer until next visit.  4. Bipolar 1 disorder ([place]) Stable per patient on Lamictal. She should try to continue Psych care - if  unable, I would refill Lamictal as long as the regimen does not need to be changed   Partially dictated using Editor, commissioning. Any errors are unintentional.  Halina Maidens, MD [facility]  08/13/2019

## 2019-08-13 NOTE — Patient Instructions (Signed)
Start checking blood pressure at home and record.  Goal is 135/85 or less.  We will discuss adding a cholesterol medication at your next visit.

## 2019-08-14 ENCOUNTER — Ambulatory Visit: Payer: BC Managed Care – PPO | Admitting: Urology

## 2019-08-14 DIAGNOSIS — N3946 Mixed incontinence: Secondary | ICD-10-CM | POA: Diagnosis not present

## 2019-08-14 NOTE — Progress Notes (Signed)
PTNS  Session # 3  Health & Social Factors: Pt has not been using CPAP as she has a cough, was recently tested for Covid due to remote exposure. Test was negative. Pt notes however that due to coughing she feels as though she is voiding more often at night.  Caffeine: 1 Alcohol: 2-3 per week Daytime voids #per day: 3-4 Night-time voids #per night: 4-7 Urgency: Mild Incontinence Episodes #per day: 1-2 Ankle used: Left Treatment Setting: 9 Feeling/ Response: Sensory Comments: Pt unable to tolerate RT ankle, will use LT for the remaining treatments.  Preformed By: Gordy Clement, CMA   Follow Up: RTC in 1 week as scheduled

## 2019-08-17 ENCOUNTER — Ambulatory Visit: Payer: BC Managed Care – PPO

## 2019-08-17 ENCOUNTER — Ambulatory Visit: Payer: Self-pay | Admitting: Physician Assistant

## 2019-08-21 ENCOUNTER — Other Ambulatory Visit: Payer: Self-pay

## 2019-08-21 ENCOUNTER — Ambulatory Visit: Payer: BC Managed Care – PPO | Admitting: Urology

## 2019-08-21 DIAGNOSIS — N3946 Mixed incontinence: Secondary | ICD-10-CM

## 2019-08-21 NOTE — Progress Notes (Signed)
PTNS  Session # 4  Health & Social Factors: None. Caffeine: 1 Alcohol: 3 per week Daytime voids #per day: 3-4 Night-time voids #per night: 2-3 Urgency: Mild Incontinence Episodes #per day: 0 Ankle used: Left Treatment Setting: 11 Feeling/ Response: Sensory Comments: Pt states she will lose insurance on the 26th of this month  Preformed By: Gordy Clement, CMA   Follow Up: RTC 1 week

## 2019-08-23 DIAGNOSIS — G4733 Obstructive sleep apnea (adult) (pediatric): Secondary | ICD-10-CM | POA: Diagnosis not present

## 2019-08-24 ENCOUNTER — Ambulatory Visit: Payer: BC Managed Care – PPO

## 2019-08-24 ENCOUNTER — Ambulatory Visit: Payer: Self-pay | Admitting: Physician Assistant

## 2019-08-27 ENCOUNTER — Other Ambulatory Visit: Payer: Self-pay

## 2019-08-27 ENCOUNTER — Ambulatory Visit
Admission: EM | Admit: 2019-08-27 | Discharge: 2019-08-27 | Disposition: A | Discharge status: Home / Self Care | Payer: BC Managed Care – PPO | Attending: Family Medicine | Admitting: Family Medicine

## 2019-08-27 DIAGNOSIS — J029 Acute pharyngitis, unspecified: Secondary | ICD-10-CM | POA: Diagnosis not present

## 2019-08-27 DIAGNOSIS — Z8249 Family history of ischemic heart disease and other diseases of the circulatory system: Secondary | ICD-10-CM | POA: Insufficient documentation

## 2019-08-27 DIAGNOSIS — E559 Vitamin D deficiency, unspecified: Secondary | ICD-10-CM | POA: Diagnosis not present

## 2019-08-27 DIAGNOSIS — E119 Type 2 diabetes mellitus without complications: Secondary | ICD-10-CM | POA: Diagnosis not present

## 2019-08-27 DIAGNOSIS — G4733 Obstructive sleep apnea (adult) (pediatric): Secondary | ICD-10-CM | POA: Diagnosis not present

## 2019-08-27 DIAGNOSIS — Z91040 Latex allergy status: Secondary | ICD-10-CM | POA: Diagnosis not present

## 2019-08-27 DIAGNOSIS — I10 Essential (primary) hypertension: Secondary | ICD-10-CM | POA: Diagnosis not present

## 2019-08-27 DIAGNOSIS — M791 Myalgia, unspecified site: Secondary | ICD-10-CM | POA: Diagnosis not present

## 2019-08-27 DIAGNOSIS — Z888 Allergy status to other drugs, medicaments and biological substances status: Secondary | ICD-10-CM | POA: Insufficient documentation

## 2019-08-27 DIAGNOSIS — B349 Viral infection, unspecified: Secondary | ICD-10-CM | POA: Diagnosis not present

## 2019-08-27 DIAGNOSIS — Z87891 Personal history of nicotine dependence: Secondary | ICD-10-CM | POA: Diagnosis not present

## 2019-08-27 DIAGNOSIS — Z833 Family history of diabetes mellitus: Secondary | ICD-10-CM | POA: Diagnosis not present

## 2019-08-27 DIAGNOSIS — E039 Hypothyroidism, unspecified: Secondary | ICD-10-CM | POA: Insufficient documentation

## 2019-08-27 DIAGNOSIS — Z7984 Long term (current) use of oral hypoglycemic drugs: Secondary | ICD-10-CM | POA: Insufficient documentation

## 2019-08-27 DIAGNOSIS — Z79899 Other long term (current) drug therapy: Secondary | ICD-10-CM | POA: Diagnosis not present

## 2019-08-27 DIAGNOSIS — R05 Cough: Secondary | ICD-10-CM | POA: Diagnosis not present

## 2019-08-27 DIAGNOSIS — Z79890 Hormone replacement therapy (postmenopausal): Secondary | ICD-10-CM | POA: Insufficient documentation

## 2019-08-27 DIAGNOSIS — Z6841 Body Mass Index (BMI) 40.0 and over, adult: Secondary | ICD-10-CM | POA: Insufficient documentation

## 2019-08-27 DIAGNOSIS — Z20822 Contact with and (suspected) exposure to covid-19: Secondary | ICD-10-CM | POA: Diagnosis not present

## 2019-08-27 DIAGNOSIS — Z8349 Family history of other endocrine, nutritional and metabolic diseases: Secondary | ICD-10-CM | POA: Diagnosis not present

## 2019-08-27 MED ORDER — BENZONATATE 200 MG PO CAPS: 200.0000 mg | ORAL_CAPSULE | Freq: Three times a day (TID) | ORAL | 0 refills | Status: DC | PRN

## 2019-08-27 NOTE — ED Provider Notes (Signed)
[facility]    CSN: [id number] Arrival date & time: 08/27/19  1252      History   Chief Complaint Chief Complaint  Patient presents with  . Cough    HPI Lori Knight is a 49 y.o. female.   49 yo female with a c/o cough, body aches and throat discomfort for the past 4 days. States also feels some shortness of breath. Denies any wheezing, sputum production. No known sick contacts.    Cough   Past Medical History:  Diagnosis Date  . Adult hypothyroidism 06/21/2012  . Bilateral polycystic ovarian syndrome 06/21/2012  . Biliary calculus with cholecystitis 08/14/2012  . Bipolar 1 disorder ([place])   . BP (high blood pressure) 02/14/2014  . Bronchitis   . Diabetes mellitus ([place]) 08/14/2012  . Diabetes mellitus without complication ([place])    pre-diabetic  . Dysrhythmia    wore heart monitor 2016. Corrected by changing Levothyroxine dose.  Marland Kitchen Heart murmur    followed by PCP-AS A CHILD-ASYMPTOMATIC  . Hyperprolactinemia ([place]) 06/21/2012  . Hypertension   . Hypoxia 04/17/2018  . Motion sickness    carnival rides  . PONV (postoperative nausea and vomiting)    WAKES UP CRYING  . Shortness of breath dyspnea    stairs. related to wt.  . Sleep apnea    has CPAP. has not used since 2011  . Vertigo    2x in last yr    Patient Active Problem List   Diagnosis Date Noted  . Mild hyperlipidemia 08/13/2019  . Gastroesophageal reflux disease without esophagitis 06/20/2019  . Elevated liver function tests 12/26/2018  . Elevated ferritin 12/20/2018  . Plantar fasciitis 12/26/2017  . Cough in adult patient 12/26/2017  . Spasm of muscle of lower back 05/11/2017  . Fibroma of foot 03/03/2016  . Neuropathic pain of finger 03/03/2016  . Type II diabetes mellitus with complication ([place]) [id number]  . OSA on CPAP 09/22/2015  . Intermittent palpitations 08/28/2015  . Insomnia 08/28/2015  . Internal hemorrhoids 08/28/2015  . Vitamin D deficiency 08/28/2015  . Obesity, Class  III, BMI 40-49.9 (morbid obesity) ([place]) 08/27/2015  . Essential hypertension 02/14/2014  . Benign paroxysmal positional nystagmus 02/14/2014  . 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 Bipolar 1 disorder ([place]) 08/14/2012  . Adult hypothyroidism 06/21/2012  . Bilateral polycystic ovarian syndrome 06/21/2012    Past Surgical History:  Procedure Laterality Date  . ABDOMINAL HYSTERECTOMY  2012   cervical dysplasia/ovaries remian  . CHOLECYSTECTOMY    . COLONOSCOPY WITH PROPOFOL N/A 06/19/2015   Procedure: COLONOSCOPY WITH PROPOFOL;  Surgeon: [name], MD;  Location: [place];  Service: Endoscopy;  Laterality: N/A;  Diabetic - oral meds   . DENTAL SURGERY    . ETHMOIDECTOMY Bilateral 04/17/2018   Procedure: ETHMOIDECTOMY;  Surgeon: [name], MD;  Location: [facility];  Service: ENT;  Laterality: Bilateral;  . GALLBLADDER SURGERY    . IMAGE GUIDED SINUS SURGERY N/A 04/17/2018   Procedure: IMAGE GUIDED SINUS SURGERY;  Surgeon: [name], MD;  Location: [facility];  Service: ENT;  Laterality: N/A;  . MAXILLARY ANTROSTOMY Bilateral 04/17/2018   Procedure: MAXILLARY ANTROSTOMY;  Surgeon: [name], MD;  Location: [facility];  Service: ENT;  Laterality: Bilateral;  . NASAL SEPTOPLASTY W/ TURBINOPLASTY Bilateral 04/17/2018   Procedure: NASAL SEPTOPLASTY WITH TURBINATE REDUCTION;  Surgeon: [name], MD;  Location: [facility];  Service: ENT;  Laterality: Bilateral;  . TRANSURETHRAL RESECTION OF BLADDER TUMOR N/A 04/17/2018   Procedure: Hoyt Koch  RESECTION OF BLADDER TUMOR (TURBT) WITH GEMCITABINE;  Surgeon: [name], MD;  Location: [facility];  Service: Urology;  Laterality: N/A;    OB History   No obstetric history on file.      Home Medications    Prior to Admission medications   Medication Sig Start Date End Date Taking? Authorizing Provider  amLODipine (NORVASC) 5 MG tablet Take 1 tablet (5 mg total) by mouth daily. 06/20/19   [name], MD  Ascorbic  Acid (VITAMIN C) 1000 MG tablet Take 1,000 mg by mouth daily.    [provider]  benzonatate (TESSALON) 200 MG capsule Take 1 capsule (200 mg total) by mouth 3 (three) times daily as needed for cough. 08/27/19   Norval Gable, MD  Cholecalciferol 5000 units capsule Take 1 capsule (5,000 Units total) by mouth daily. 11/28/15   Adline Potter, MD  CONTOUR NEXT TEST test strip USE TO CHECK BS UP TO 3 TIMES DAILY FOR DIABETES DX: E11.9 06/13/19   [name], MD  Dulaglutide 1.5 MG/0.5ML SOPN Inject 1.5 mg into the skin once a week. 08/28/18   [provider]  glipiZIDE (GLUCOTROL) 5 MG tablet Take 5 mg by mouth every morning. 06/04/19   [provider]  lamoTRIgine (LAMICTAL) 150 MG tablet Take 300 mg by mouth daily. 05/15/19   [provider]  levothyroxine (SYNTHROID, LEVOTHROID) 175 MCG tablet TAKE 1 TABLET (175 MCG TOTAL) BY MOUTH DAILY. 09/05/17   [name], MD  losartan (COZAAR) 100 MG tablet TAKE 1/2 TABLET (50 MG TOTAL) BY MOUTH 2 (TWO) TIMES DAILY. 04/23/19   [name], MD  metFORMIN (GLUCOPHAGE) 500 MG tablet Take 1 tablet (500 mg total) by mouth 3 (three) times daily. Patient taking differently: Take 1,000 mg by mouth 2 (two) times daily with a meal.  07/10/18   [name], MD  Multiple Vitamin (MULTIVITAMIN) tablet Take 1 tablet by mouth daily.    [provider]  naproxen (NAPROSYN) 375 MG tablet Take 1 tablet (375 mg total) by mouth 2 (two) times daily. 07/09/19   LampteyMyrene Galas, MD  Omega-3 Fatty Acids (FISH OIL) 1200 MG CAPS Take 1,200 mg by mouth daily.     [provider]  omeprazole (PRILOSEC) 40 MG capsule Take 1 capsule (40 mg total) by mouth daily. 05/30/19   [name], MD  ondansetron (ZOFRAN ODT) 4 MG disintegrating tablet Take 1 tablet (4 mg total) by mouth every 8 (eight) hours as needed for nausea or vomiting. 07/09/19   Lamptey, Myrene Galas, MD  triamcinolone (NASACORT ALLERGY 24HR) 55  MCG/ACT AERO nasal inhaler Place 2 sprays into the nose daily.    [provider]  albuterol (VENTOLIN HFA) 108 (90 Base) MCG/ACT inhaler Inhale 2 puffs into the lungs every 4 (four) hours as needed for wheezing or shortness of breath. 11/23/18 08/04/19  [name], MD    Family History Family History  Problem Relation Age of Onset  . Diabetes Maternal Grandmother   . Hypertension Maternal Grandmother   . Hyperlipidemia Maternal Grandmother   . Hypothyroidism Maternal Grandmother   . Heart disease Maternal Grandmother   . Diabetes Mother   . Hypertension Mother   . Hypothyroidism Mother   . Bipolar disorder Mother   . Heart disease Mother   . Breast cancer Neg Hx     Social History Social History   Tobacco Use  . Smoking status: Former Smoker    Packs/day: 1.00  Years: 10.00    Pack years: 10.00    Types: Cigarettes    Quit date: 07/12/2002    Years since quitting: 17.1  . Smokeless tobacco: Never Used  . Tobacco comment: quit 2004  Substance Use Topics  . Alcohol use: Yes    Alcohol/week: 0.0 standard drinks    Comment: rarely  . Drug use: No     Allergies   Hctz [hydrochlorothiazide], Lisinopril, Latex, and Prednisone   Review of Systems Review of Systems  Respiratory: Positive for cough.      Physical Exam Triage Vital Signs ED Triage Vitals  Enc Vitals Group     BP 08/27/19 1320 129/81     Pulse Rate 08/27/19 1320 85     Resp 08/27/19 1320 20     Temp 08/27/19 1320 98.2 F (36.8 C)     Temp Source 08/27/19 1320 Oral     SpO2 08/27/19 1320 98 %     Weight 08/27/19 1318 293 lb 14 oz (133.3 kg)     Height 08/27/19 1318 5\' 6"  (1.676 m)     Head Circumference --      Peak Flow --      Pain Score 08/27/19 1318 2     Pain Loc --      Pain Edu? --      Excl. in [place]? --    No data found.  Updated Vital Signs BP 129/81 (BP Location: Right Arm)   Pulse 85   Temp 98.2 F (36.8 C) (Oral)   Resp 20   Ht 5\' 6"  (1.676 m)   Wt 133.3 kg    SpO2 98%   BMI 47.43 kg/m   Visual Acuity Right Eye Distance:   Left Eye Distance:   Bilateral Distance:    Right Eye Near:   Left Eye Near:    Bilateral Near:     Physical Exam Vitals and nursing note reviewed.  Constitutional:      General: She is not in acute distress.    Appearance: She is not toxic-appearing or diaphoretic.  HENT:     Mouth/Throat:     Pharynx: Posterior oropharyngeal erythema present. No oropharyngeal exudate.  Cardiovascular:     Rate and Rhythm: Normal rate.     Heart sounds: Normal heart sounds.  Pulmonary:     Effort: Pulmonary effort is normal. No respiratory distress.     Breath sounds: Normal breath sounds.  Musculoskeletal:     Cervical back: Normal range of motion and neck supple.  Neurological:     Mental Status: She is alert.      UC Treatments / Results  Labs (all labs ordered are listed, but only abnormal results are displayed) Labs Reviewed  NOVEL CORONAVIRUS, NAA (HOSP ORDER, SEND-OUT TO REF LAB; TAT 18-24 HRS)    EKG   Radiology No results found.  Procedures Procedures (including critical care time)  Medications Ordered in UC Medications - No data to display  Initial Impression / Assessment and Plan / UC Course  I have reviewed the triage vital signs and the nursing notes.  Pertinent labs & imaging results that were available during my care of the patient were reviewed by me and considered in my medical decision making (see chart for details).      Final Clinical Impressions(s) / UC Diagnoses   Final diagnoses:  Viral syndrome     Discharge Instructions     Rest, fluids, over the counter medications as needed    ED  Prescriptions    Medication Sig Dispense Auth. Provider   benzonatate (TESSALON) 200 MG capsule Take 1 capsule (200 mg total) by mouth 3 (three) times daily as needed for cough. 30 capsule Norval Gable, MD      1. diagnosis reviewed with patient 2. rx as per orders above; reviewed  possible side effects, interactions, risks and benefits  3. Recommend supportive treatment as above  4. Follow-up prn if symptoms worsen or don't improve   PDMP not reviewed this encounter.   Norval Gable, MD 08/27/19 1357

## 2019-08-27 NOTE — Discharge Instructions (Addendum)
Rest, fluids, over the counter medications as needed  

## 2019-08-27 NOTE — ED Triage Notes (Signed)
Pt states cough, sore throat starting Friday. States hard to take a deep breath. Generalized bodyaches over the weekend.

## 2019-08-28 ENCOUNTER — Ambulatory Visit: Payer: Self-pay | Admitting: Urology

## 2019-08-28 LAB — NOVEL CORONAVIRUS, NAA (HOSP ORDER, SEND-OUT TO REF LAB; TAT 18-24 HRS): SARS-CoV-2, NAA: NOT DETECTED

## 2019-08-30 DIAGNOSIS — F603 Borderline personality disorder: Secondary | ICD-10-CM | POA: Diagnosis not present

## 2019-08-30 DIAGNOSIS — F411 Generalized anxiety disorder: Secondary | ICD-10-CM | POA: Diagnosis not present

## 2019-08-30 DIAGNOSIS — Z79899 Other long term (current) drug therapy: Secondary | ICD-10-CM | POA: Diagnosis not present

## 2019-08-31 ENCOUNTER — Ambulatory Visit: Payer: Self-pay | Admitting: Physician Assistant

## 2019-08-31 DIAGNOSIS — E039 Hypothyroidism, unspecified: Secondary | ICD-10-CM | POA: Diagnosis not present

## 2019-08-31 DIAGNOSIS — E1165 Type 2 diabetes mellitus with hyperglycemia: Secondary | ICD-10-CM | POA: Diagnosis not present

## 2019-09-03 ENCOUNTER — Other Ambulatory Visit: Payer: Self-pay

## 2019-09-03 ENCOUNTER — Telehealth: Payer: Self-pay | Admitting: Internal Medicine

## 2019-09-03 DIAGNOSIS — R059 Cough, unspecified: Secondary | ICD-10-CM

## 2019-09-03 DIAGNOSIS — R05 Cough: Secondary | ICD-10-CM

## 2019-09-03 NOTE — Telephone Encounter (Signed)
Informed patient

## 2019-09-03 NOTE — Telephone Encounter (Signed)
Lori Knight needs a referral to pulmonology she does not have a preference but she did say someone in the [place] system.

## 2019-09-04 ENCOUNTER — Ambulatory Visit: Payer: Self-pay | Admitting: Urology

## 2019-09-04 DIAGNOSIS — I10 Essential (primary) hypertension: Secondary | ICD-10-CM | POA: Diagnosis not present

## 2019-09-04 DIAGNOSIS — E1165 Type 2 diabetes mellitus with hyperglycemia: Secondary | ICD-10-CM | POA: Diagnosis not present

## 2019-09-04 DIAGNOSIS — E039 Hypothyroidism, unspecified: Secondary | ICD-10-CM | POA: Diagnosis not present

## 2019-09-07 ENCOUNTER — Ambulatory Visit: Payer: Self-pay | Admitting: Physician Assistant

## 2019-09-07 ENCOUNTER — Ambulatory Visit: Payer: BC Managed Care – PPO | Admitting: Internal Medicine

## 2019-09-10 DIAGNOSIS — G4733 Obstructive sleep apnea (adult) (pediatric): Secondary | ICD-10-CM | POA: Diagnosis not present

## 2019-09-10 DIAGNOSIS — R05 Cough: Secondary | ICD-10-CM | POA: Diagnosis not present

## 2019-09-10 DIAGNOSIS — R682 Dry mouth, unspecified: Secondary | ICD-10-CM | POA: Diagnosis not present

## 2019-09-10 DIAGNOSIS — K219 Gastro-esophageal reflux disease without esophagitis: Secondary | ICD-10-CM | POA: Diagnosis not present

## 2019-09-11 ENCOUNTER — Other Ambulatory Visit: Payer: Self-pay

## 2019-09-11 ENCOUNTER — Ambulatory Visit: Payer: BC Managed Care – PPO | Admitting: Urology

## 2019-09-11 DIAGNOSIS — N3946 Mixed incontinence: Secondary | ICD-10-CM | POA: Diagnosis not present

## 2019-09-11 NOTE — Progress Notes (Signed)
PTNS  Session # 5  Health & Social Factors: new cough Caffeine: 4-5 Alcohol: 0 Daytime voids #per day: 5-6 Night-time voids #per night: 3 Urgency: Mild Incontinence Episodes #per day: 0  Ankle used: Left  Treatment Setting: 5 Feeling/ Response: Sensory  Comments: Pt states that she has been drinking unsweet tea but didn't realize it was caffeinated she is working to reduce her intake.  Preformed By: Gordy Clement, CMA   Follow Up: RTC 1 week

## 2019-09-13 DIAGNOSIS — R0600 Dyspnea, unspecified: Secondary | ICD-10-CM | POA: Diagnosis not present

## 2019-09-13 DIAGNOSIS — Z20822 Contact with and (suspected) exposure to covid-19: Secondary | ICD-10-CM | POA: Diagnosis not present

## 2019-09-14 DIAGNOSIS — R0600 Dyspnea, unspecified: Secondary | ICD-10-CM | POA: Diagnosis not present

## 2019-09-17 DIAGNOSIS — R454 Irritability and anger: Secondary | ICD-10-CM | POA: Diagnosis not present

## 2019-09-17 DIAGNOSIS — F319 Bipolar disorder, unspecified: Secondary | ICD-10-CM | POA: Diagnosis not present

## 2019-09-17 DIAGNOSIS — Z79899 Other long term (current) drug therapy: Secondary | ICD-10-CM | POA: Diagnosis not present

## 2019-09-18 ENCOUNTER — Ambulatory Visit: Payer: Self-pay | Admitting: Urology

## 2019-09-18 ENCOUNTER — Ambulatory Visit: Payer: BC Managed Care – PPO | Admitting: Urology

## 2019-09-25 ENCOUNTER — Ambulatory Visit: Payer: Self-pay | Admitting: Urology

## 2019-10-02 ENCOUNTER — Ambulatory Visit: Payer: Self-pay | Admitting: Urology

## 2019-10-02 ENCOUNTER — Ambulatory Visit: Payer: BC Managed Care – PPO | Admitting: Urology

## 2019-10-09 ENCOUNTER — Other Ambulatory Visit: Payer: Self-pay

## 2019-10-09 ENCOUNTER — Ambulatory Visit: Payer: BC Managed Care – PPO | Admitting: Urology

## 2019-10-10 DIAGNOSIS — G4733 Obstructive sleep apnea (adult) (pediatric): Secondary | ICD-10-CM | POA: Diagnosis not present

## 2019-10-11 DIAGNOSIS — Z86018 Personal history of other benign neoplasm: Secondary | ICD-10-CM | POA: Diagnosis not present

## 2019-10-11 DIAGNOSIS — Z872 Personal history of diseases of the skin and subcutaneous tissue: Secondary | ICD-10-CM | POA: Diagnosis not present

## 2019-10-11 DIAGNOSIS — F319 Bipolar disorder, unspecified: Secondary | ICD-10-CM | POA: Diagnosis not present

## 2019-10-11 DIAGNOSIS — L578 Other skin changes due to chronic exposure to nonionizing radiation: Secondary | ICD-10-CM | POA: Diagnosis not present

## 2019-10-11 DIAGNOSIS — F603 Borderline personality disorder: Secondary | ICD-10-CM | POA: Diagnosis not present

## 2019-10-11 DIAGNOSIS — L720 Epidermal cyst: Secondary | ICD-10-CM | POA: Diagnosis not present

## 2019-10-16 ENCOUNTER — Ambulatory Visit: Payer: BC Managed Care – PPO | Admitting: Urology

## 2019-10-25 ENCOUNTER — Ambulatory Visit: Payer: BC Managed Care – PPO | Admitting: Physician Assistant

## 2019-10-26 ENCOUNTER — Other Ambulatory Visit: Payer: Self-pay

## 2019-10-26 ENCOUNTER — Ambulatory Visit (INDEPENDENT_AMBULATORY_CARE_PROVIDER_SITE_OTHER): Payer: BC Managed Care – PPO | Admitting: Urology

## 2019-10-26 DIAGNOSIS — N3946 Mixed incontinence: Secondary | ICD-10-CM | POA: Diagnosis not present

## 2019-10-26 NOTE — Progress Notes (Signed)
PTNS  Session # 6  Health & Social Factors: same Caffeine: 3 Alcohol: 0 Daytime voids #per day: 6 Night-time voids #per night: 3 Urgency: moderate Incontinence Episodes #per day: 3-4 Ankle used: left Treatment Setting: 9 Feeling/ Response: both Comments: patient noted some discomfort with needle stick  Preformed By: Fonnie Jarvis, CMA  Follow Up: 1wk

## 2019-10-26 NOTE — Patient Instructions (Signed)
Tracking Your Bladder Symptoms    Patient Name:___________________________________________________   Sample: Day   Daytime Voids  Nighttime Voids Urgency for the Day(0-4) Number of Accidents Beverage Comments  Monday IIII II 2 I Water IIII Coffee  I      Week Starting:____________________________________   Day Daytime  Voids Nighttime  Voids Urgency for  The Day(0-4) Number of Accidents Beverages Comments                                                           This week my symptoms were:  O much better  O better O the same O worse   

## 2019-10-29 DIAGNOSIS — R454 Irritability and anger: Secondary | ICD-10-CM | POA: Diagnosis not present

## 2019-10-29 DIAGNOSIS — F3161 Bipolar disorder, current episode mixed, mild: Secondary | ICD-10-CM | POA: Diagnosis not present

## 2019-10-29 DIAGNOSIS — F411 Generalized anxiety disorder: Secondary | ICD-10-CM | POA: Diagnosis not present

## 2019-10-30 ENCOUNTER — Ambulatory Visit (INDEPENDENT_AMBULATORY_CARE_PROVIDER_SITE_OTHER): Payer: BC Managed Care – PPO | Admitting: Urology

## 2019-10-30 ENCOUNTER — Other Ambulatory Visit: Payer: Self-pay

## 2019-10-30 DIAGNOSIS — N3946 Mixed incontinence: Secondary | ICD-10-CM

## 2019-10-30 NOTE — Progress Notes (Signed)
PTNS  Session # 7  Health & Social Factors: Patient is now working from home. Using CPAP  Caffeine: 3 Alcohol: 0 Daytime voids #per day: 6 Night-time voids #per night: 2 Urgency: Severe  Incontinence Episodes #per day: Leakage when standing 3-4 Ankle used: Left Treatment Setting: 9 Feeling/ Response: Sensory  Comments: Bilateral leg edema   Preformed By: Gordy Clement, CMA   Follow Up: RTC 1 week

## 2019-11-02 ENCOUNTER — Encounter: Payer: Self-pay | Admitting: Internal Medicine

## 2019-11-06 ENCOUNTER — Ambulatory Visit: Payer: BC Managed Care – PPO | Admitting: Urology

## 2019-11-06 ENCOUNTER — Other Ambulatory Visit: Payer: Self-pay

## 2019-11-13 ENCOUNTER — Ambulatory Visit (INDEPENDENT_AMBULATORY_CARE_PROVIDER_SITE_OTHER): Payer: BC Managed Care – PPO | Admitting: Urology

## 2019-11-13 ENCOUNTER — Other Ambulatory Visit: Payer: Self-pay

## 2019-11-13 DIAGNOSIS — N3946 Mixed incontinence: Secondary | ICD-10-CM

## 2019-11-13 NOTE — Progress Notes (Signed)
PTNS  Session # 8  Health & Social Factors: Patient is beginning to exercise, using treadmill. Working to decrease caffeine intake. Working on timed voiding.  Caffeine: 3 Alcohol: 0 Daytime voids #per day: 6 Night-time voids #per night: 2 Urgency: Mild Incontinence Episodes #per day: 2 Ankle used: Right Treatment Setting: 6 Feeling/ Response: Sensory Comments: Bilateral leg edema   Preformed By: Gordy Clement, CMA   Follow Up: RTC in 1 week

## 2019-11-20 ENCOUNTER — Ambulatory Visit (INDEPENDENT_AMBULATORY_CARE_PROVIDER_SITE_OTHER): Payer: BC Managed Care – PPO | Admitting: Physician Assistant

## 2019-11-20 ENCOUNTER — Other Ambulatory Visit: Payer: Self-pay

## 2019-11-20 ENCOUNTER — Ambulatory Visit: Payer: BC Managed Care – PPO | Admitting: Urology

## 2019-11-20 DIAGNOSIS — N3946 Mixed incontinence: Secondary | ICD-10-CM | POA: Diagnosis not present

## 2019-11-20 NOTE — Progress Notes (Signed)
PTNS  Session # 9  Health & Social Factors: Walking a lot more for new job Caffeine: 3 Alcohol: 0 Daytime voids #per day: 4-5 Night-time voids #per night: 2 (wakes up w/ wet pad) Urgency: Strong Incontinence Episodes #per day: 2 Ankle used: Right Treatment Setting: 4 Feeling/ Response:Sensory Comments: Bilateral leg edema  Preformed By: Gordy Clement, CMA  Follow Up: RTC in 1 week

## 2019-11-27 ENCOUNTER — Other Ambulatory Visit: Payer: Self-pay

## 2019-11-27 ENCOUNTER — Ambulatory Visit: Payer: BC Managed Care – PPO | Admitting: Urology

## 2019-11-27 ENCOUNTER — Ambulatory Visit (INDEPENDENT_AMBULATORY_CARE_PROVIDER_SITE_OTHER): Payer: BC Managed Care – PPO | Admitting: Physician Assistant

## 2019-11-27 DIAGNOSIS — N3946 Mixed incontinence: Secondary | ICD-10-CM | POA: Diagnosis not present

## 2019-11-27 NOTE — Progress Notes (Signed)
PTNS  Session #10  Health & Social Factors: Walking a lot more for new job Caffeine: 3 Alcohol: 0 Daytime voids #per day: 4-5 Night-time voids #per night: 2 (wakes up w/ wet pad) Urgency: Strong Incontinence Episodes #per day: 2 Ankle used: Left  Treatment Setting:8 Feeling/ Response:Sensory Comments: Bilateral leg edema  Preformed By: Gordy Clement, CMA  Follow Up: RTC in 1 week

## 2019-12-04 ENCOUNTER — Ambulatory Visit: Payer: BC Managed Care – PPO | Admitting: Urology

## 2019-12-04 ENCOUNTER — Ambulatory Visit: Payer: BC Managed Care – PPO | Admitting: Physician Assistant

## 2019-12-05 DIAGNOSIS — E039 Hypothyroidism, unspecified: Secondary | ICD-10-CM | POA: Diagnosis not present

## 2019-12-05 DIAGNOSIS — E1165 Type 2 diabetes mellitus with hyperglycemia: Secondary | ICD-10-CM | POA: Diagnosis not present

## 2019-12-06 DIAGNOSIS — Z01818 Encounter for other preprocedural examination: Secondary | ICD-10-CM | POA: Diagnosis not present

## 2019-12-07 DIAGNOSIS — Z01818 Encounter for other preprocedural examination: Secondary | ICD-10-CM | POA: Diagnosis not present

## 2019-12-11 ENCOUNTER — Other Ambulatory Visit: Payer: Self-pay

## 2019-12-11 ENCOUNTER — Ambulatory Visit: Payer: BC Managed Care – PPO | Admitting: Urology

## 2019-12-11 ENCOUNTER — Ambulatory Visit: Payer: BC Managed Care – PPO | Admitting: Physician Assistant

## 2019-12-11 DIAGNOSIS — R351 Nocturia: Secondary | ICD-10-CM

## 2019-12-11 NOTE — Progress Notes (Signed)
PTNS  Session #11  [place] a lot more for new job Caffeine:2 Alcohol:0 Daytime voids #per day:5-6 Night-time voids #per night:2-3 Urgency:Strong Incontinence Episodes #per day:2 Ankle used:Right Treatment Setting:4 Feeling/ Response:Sensory & Toe Flex Comments:Bilateral leg edema  Preformed [id number] [place], CMA  Follow Up:RTC in 1 week

## 2019-12-13 DIAGNOSIS — E1165 Type 2 diabetes mellitus with hyperglycemia: Secondary | ICD-10-CM | POA: Diagnosis not present

## 2019-12-13 DIAGNOSIS — I10 Essential (primary) hypertension: Secondary | ICD-10-CM | POA: Diagnosis not present

## 2019-12-13 DIAGNOSIS — E039 Hypothyroidism, unspecified: Secondary | ICD-10-CM | POA: Diagnosis not present

## 2019-12-15 DIAGNOSIS — H52223 Regular astigmatism, bilateral: Secondary | ICD-10-CM | POA: Diagnosis not present

## 2019-12-15 DIAGNOSIS — E119 Type 2 diabetes mellitus without complications: Secondary | ICD-10-CM | POA: Diagnosis not present

## 2019-12-15 DIAGNOSIS — H524 Presbyopia: Secondary | ICD-10-CM | POA: Diagnosis not present

## 2019-12-15 DIAGNOSIS — H5213 Myopia, bilateral: Secondary | ICD-10-CM | POA: Diagnosis not present

## 2019-12-18 ENCOUNTER — Other Ambulatory Visit: Payer: Self-pay

## 2019-12-18 ENCOUNTER — Ambulatory Visit: Payer: BC Managed Care – PPO | Admitting: Physician Assistant

## 2019-12-18 ENCOUNTER — Ambulatory Visit (INDEPENDENT_AMBULATORY_CARE_PROVIDER_SITE_OTHER): Payer: BC Managed Care – PPO | Admitting: Urology

## 2019-12-18 DIAGNOSIS — R351 Nocturia: Secondary | ICD-10-CM

## 2019-12-18 NOTE — Progress Notes (Signed)
PTNS  Session # 12  Health & Social Factors: no change Caffeine: 1-2 Alcohol: <1 Daytime voids #per day: 3-4 Night-time voids #per night: 2+ Urgency: strong Incontinence Episodes #per day: multiple--with cough, sneeze Ankle used: Left Treatment Setting: 6 Feeling/ Response: Sensory Comments: Patient reports significant improvement in daytime frequency with PTNS treatments.  She continues to experience nocturia x2+ and remains compliant with CPAP.  She wishes to proceed with monthly maintenance.  Performed By: Zara Council, PA-C  Follow Up: 1 month PTNS maintenance

## 2019-12-21 ENCOUNTER — Encounter: Payer: Self-pay | Admitting: Nurse Practitioner

## 2019-12-21 ENCOUNTER — Other Ambulatory Visit: Payer: Self-pay | Admitting: Neurosurgery

## 2019-12-21 DIAGNOSIS — D329 Benign neoplasm of meninges, unspecified: Secondary | ICD-10-CM

## 2019-12-24 ENCOUNTER — Ambulatory Visit: Payer: BC Managed Care – PPO | Admitting: Nurse Practitioner

## 2019-12-24 ENCOUNTER — Encounter: Payer: Self-pay | Admitting: Nurse Practitioner

## 2019-12-24 ENCOUNTER — Other Ambulatory Visit: Payer: Self-pay

## 2019-12-24 VITALS — BP 132/83 | HR 87 | Temp 98.7°F | Ht 66.5 in | Wt 305.0 lb

## 2019-12-24 DIAGNOSIS — R059 Cough, unspecified: Secondary | ICD-10-CM

## 2019-12-24 DIAGNOSIS — F309 Manic episode, unspecified: Secondary | ICD-10-CM

## 2019-12-24 DIAGNOSIS — Z7689 Persons encountering health services in other specified circumstances: Secondary | ICD-10-CM | POA: Diagnosis not present

## 2019-12-24 DIAGNOSIS — E1159 Type 2 diabetes mellitus with other circulatory complications: Secondary | ICD-10-CM | POA: Diagnosis not present

## 2019-12-24 DIAGNOSIS — E039 Hypothyroidism, unspecified: Secondary | ICD-10-CM

## 2019-12-24 DIAGNOSIS — I10 Essential (primary) hypertension: Secondary | ICD-10-CM

## 2019-12-24 DIAGNOSIS — I152 Hypertension secondary to endocrine disorders: Secondary | ICD-10-CM

## 2019-12-24 DIAGNOSIS — G4733 Obstructive sleep apnea (adult) (pediatric): Secondary | ICD-10-CM

## 2019-12-24 DIAGNOSIS — E1169 Type 2 diabetes mellitus with other specified complication: Secondary | ICD-10-CM

## 2019-12-24 DIAGNOSIS — E785 Hyperlipidemia, unspecified: Secondary | ICD-10-CM

## 2019-12-24 DIAGNOSIS — D329 Benign neoplasm of meninges, unspecified: Secondary | ICD-10-CM

## 2019-12-24 DIAGNOSIS — R05 Cough: Secondary | ICD-10-CM

## 2019-12-24 DIAGNOSIS — Z9989 Dependence on other enabling machines and devices: Secondary | ICD-10-CM

## 2019-12-24 NOTE — Patient Instructions (Signed)

## 2019-12-24 NOTE — Assessment & Plan Note (Signed)
Referral to weight management at [facility] placed, she wishes to focus less on medications and more on weight loss to help reduce her medication intake.

## 2019-12-24 NOTE — Assessment & Plan Note (Signed)
Ongoing, stable at this time.  Will continue collaboration with pulmonary and current medication regimen which includes Trelegy, Prilosec, and Tessalon.  Recent note reviewed.

## 2019-12-24 NOTE — Assessment & Plan Note (Signed)
Chronic, ongoing with BP at goal today in office.  Continue current medication regimen and adjust as needed.  Focus on DASH diet and regular exercise at home.  Recommend she check BP at few days a week at home and document for provider.  Referral to weight management at [facility] placed, she wishes to focus less on medications and more on weight loss to help reduce her medication intake.  Return in 8 weeks and will obtain BMP next visit.

## 2019-12-24 NOTE — Assessment & Plan Note (Signed)
Chronic, stable, managed by endocrinology.  Continue current medication regimen as prescribed by endocrinology and adjust as needed.  Recent labs obtained by endocrinology and within normal range. 

## 2019-12-24 NOTE — Progress Notes (Signed)
New Patient Office Visit  Subjective:  Patient ID: Lori Knight, female    DOB: 06-30-1971  Age: 49 y.o. MRN: [id number]  CC:  Chief Complaint  Patient presents with  . Establish Care    pt states she wants to discuss possibly coming off some medications and menopause     HPI Lori Knight presents for new patient visit to establish care.  Introduced to Designer, jewellery role and practice setting.  All questions answered.  Discussed provider/patient relationship and expectations.  DIABETES Followed by endocrinology and last seen 12/13/2019 with her A1C 6.9%.  Continues on Metformin 1000 MG BID (intolerant to higher dosing), Trulicity 1.5 MG weekly, and Glimepiride 5 MG daily.  She has appointment with diabetic dietician and is going to learn about about diet and how to use monitors.  Her goal is weight loss, she reports currently being size 24 and that when she lost weight before and was size 14 she was able to come off a lot of her medications, which is her goal at this time.   Hypoglycemic episodes:no Polydipsia/polyuria: occasional, but sees urology for nocturia issues Visual disturbance: no Chest pain: no Paresthesias: no Glucose Monitoring: no  Accucheck frequency: Not Checking  Fasting glucose:  Post prandial:  Evening:  Before meals: Taking Insulin?: no  Long acting insulin:  Short acting insulin: Blood Pressure Monitoring: not checking Retinal Examination: Up to Date Foot Exam: Up to Date Diabetic Education: Completed Pneumovax: Up to Date Influenza: Up to Date Aspirin: no   HYPOTHYROIDISM Followed by endocrinology and continues on Levothyroxine 175 MCG 6 days per week, takes 2 tablets on Sunday.  TSH on 12/05/19 was 1.527.  Thyroid control status:stable Satisfied with current treatment? yes Medication side effects: no Medication compliance: good compliance Etiology of hypothyroidism:  Recent dose adjustment:no Fatigue: no Cold intolerance: no Heat  intolerance: no Weight gain: no Weight loss: no Constipation: no Diarrhea/loose stools: no Palpitations: no Lower extremity edema: no Anxiety/depressed mood: no  HYPERTENSION / HYPERLIPIDEMIA Continues on Losartan 100 MG, Amlodipine 5 MG and no current statin, but does use Fish Oil.  Has CPAP and uses 25%, often gets up to urinate and forgets to put it back on.    Her last echo in 2019 noted EF 50-55%.  Is currently being followed by pulmonary for a chronic cough, currently using Trelegy and Tessalon + Prilosec.  Reports she is the heaviest she has been in her life and the SOB started with this.  Has history of smoking, smoked for 9 years and quit in 2004. Satisfied with current treatment? yes Duration of hypertension: chronic BP monitoring frequency: not checking BP range:  BP medication side effects: no Duration of hyperlipidemia: chronic Aspirin: no Recent stressors: no Recurrent headaches: no Visual changes: no Palpitations: no Dyspnea: no Chest pain: no Lower extremity edema: no Dizzy/lightheaded: no  The ASCVD Risk score Mikey Bussing DC Jr., et al., 2013) failed to calculate for the following reasons:   The patient has a prior MI or stroke diagnosis  BIPOLAR I DISORDER Goes to psychiatry at this time.  Continues on Lamictal 300 MG daily.  About 13 months ago her mother shot herself and she does endorse that this is when weight gain became more prevalent in her life.  This is first suicide in her family.    She has concerns for menopause and would like labs to assess this, reports some decreased concentration and occasional brain fog.  Has seen neurology and has small meningioma,  is following up with them in July. Mood status: stable Satisfied with current treatment?: yes Symptom severity: mild  Duration of current treatment : chronic Side effects: no Medication compliance: good compliance Psychotherapy/counseling: none Depressed mood: no Anxious mood: no Anhedonia:  no Significant weight loss or gain: no Insomnia: none Fatigue: no Feelings of worthlessness or guilt: no Impaired concentration/indecisiveness: yes Suicidal ideations: no Hopelessness: no Crying spells: no Depression screen [facility] 2/9 12/24/2019 08/13/2019 06/20/2019 09/15/2018 05/05/2018  Decreased Interest 0 0 _0 Down, Depressed, Hopeless 1 0 _1 PHQ - 2 Score 1 0 _2 Altered sleeping - 0 1 0 0  Tired, decreased energy - 0 1 0 0  Change in appetite - 2 1 0 3  Feeling bad or failure about yourself  - 0 0 0 2  Trouble concentrating - 0 0 0 0  Moving slowly or fidgety/restless - 0 0 0 0  Suicidal thoughts - 0 0 0 0  PHQ-9 Score - _3 Difficult doing work/chores - Not difficult at all Not difficult at all - Somewhat difficult    Past Medical History:  Diagnosis Date  . Adult hypothyroidism 06/21/2012  . Bilateral polycystic ovarian syndrome 06/21/2012  . Biliary calculus with cholecystitis 08/14/2012  . Bipolar 1 disorder ([place])   . BP (high blood pressure) 02/14/2014  . Bronchitis   . Diabetes mellitus ([place]) 08/14/2012  . Diabetes mellitus without complication ([place])    pre-diabetic  . Dysrhythmia    wore heart monitor 2016. Corrected by changing Levothyroxine dose.  Marland Kitchen Heart murmur    followed by PCP-AS A CHILD-ASYMPTOMATIC  . Hyperprolactinemia ([place]) 06/21/2012  . Hypertension   . Hypoxia 04/17/2018  . Motion sickness    carnival rides  . PONV (postoperative nausea and vomiting)    WAKES UP CRYING  . Shortness of breath dyspnea    stairs. related to wt.  . Sleep apnea    has CPAP. has not used since 2011  . Vertigo    2x in last yr    Past Surgical History:  Procedure Laterality Date  . ABDOMINAL HYSTERECTOMY  2012   cervical dysplasia/ovaries remian  . CHOLECYSTECTOMY    . COLONOSCOPY WITH PROPOFOL N/A 06/19/2015   Procedure: COLONOSCOPY WITH PROPOFOL;  Surgeon: [name], MD;  Location: [facility];  Service: Endoscopy;  Laterality: N/A;  Diabetic -  oral meds   . DENTAL SURGERY    . ETHMOIDECTOMY Bilateral 04/17/2018   Procedure: ETHMOIDECTOMY;  Surgeon: [name], MD;  Location: [facility];  Service: ENT;  Laterality: Bilateral;  . GALLBLADDER SURGERY    . IMAGE GUIDED SINUS SURGERY N/A 04/17/2018   Procedure: IMAGE GUIDED SINUS SURGERY;  Surgeon: [name], MD;  Location: [facility];  Service: ENT;  Laterality: N/A;  . MAXILLARY ANTROSTOMY Bilateral 04/17/2018   Procedure: MAXILLARY ANTROSTOMY;  Surgeon: [name], MD;  Location: [facility];  Service: ENT;  Laterality: Bilateral;  . NASAL SEPTOPLASTY W/ TURBINOPLASTY Bilateral 04/17/2018   Procedure: NASAL SEPTOPLASTY WITH TURBINATE REDUCTION;  Surgeon: [name], MD;  Location: [facility];  Service: ENT;  Laterality: Bilateral;  . TRANSURETHRAL RESECTION OF BLADDER TUMOR N/A 04/17/2018   Procedure: NTRANSURETHRAL RESECTION OF BLADDER TUMOR (TURBT) WITH GEMCITABINE;  Surgeon: [name], MD;  Location: [facility];  Service: Urology;  Laterality: N/A;    Family History  Problem Relation Age of Onset  . Diabetes Maternal Grandmother   . Hypertension Maternal Grandmother   .  Hyperlipidemia Maternal Grandmother   . Hypothyroidism Maternal Grandmother   . Heart disease Maternal Grandmother   . Diabetes Mother   . Hypertension Mother   . Hypothyroidism Mother   . Bipolar disorder Mother   . Heart disease Mother   . Alcohol abuse Father   . Depression Daughter   . Breast cancer Neg Hx     Social History   Socioeconomic History  . Marital status: Significant Other    Spouse name: Not on file  . Number of children: Not on file  . Years of education: Not on file  . Highest education level: Not on file  Occupational History  . Not on file  Tobacco Use  . Smoking status: Former Smoker    Packs/day: 1.00    Years: 10.00    Pack years: 10.00    Types: Cigarettes    Quit date: 07/12/2002    Years since quitting: 17.4  . Smokeless tobacco: Never Used  . Tobacco comment:  quit 2004  Vaping Use  . Vaping Use: Never used  Substance and Sexual Activity  . Alcohol use: Yes    Alcohol/week: 0.0 standard drinks    Comment: rarely  . Drug use: No  . Sexual activity: Yes  Other Topics Concern  . Not on file  Social History Narrative  . Not on file   Social Determinants of Health   Financial Resource Strain:   . Difficulty of Paying Living Expenses:   Food Insecurity:   . Worried About Charity fundraiser in the Last Year:   . Arboriculturist in the Last Year:   Transportation Needs:   . Film/video editor (Medical):   Marland Kitchen Lack of Transportation (Non-Medical):   Physical Activity:   . Days of Exercise per Week:   . Minutes of Exercise per Session:   Stress:   . Feeling of Stress :   Social Connections:   . Frequency of Communication with Friends and Family:   . Frequency of Social Gatherings with Friends and Family:   . Attends Religious Services:   . Active Member of Clubs or Organizations:   . Attends Archivist Meetings:   Marland Kitchen Marital Status:   Intimate Partner Violence:   . Fear of Current or Ex-Partner:   . Emotionally Abused:   Marland Kitchen Physically Abused:   . Sexually Abused:     ROS Review of Systems  Constitutional: Negative for activity change, appetite change, diaphoresis, fatigue and fever.  Respiratory: Negative for cough, chest tightness and shortness of breath.   Cardiovascular: Negative for chest pain, palpitations and leg swelling.  Gastrointestinal: Negative.   Endocrine: Negative for cold intolerance, heat intolerance, polydipsia, polyphagia and polyuria.  Neurological: Negative.   Psychiatric/Behavioral: Negative.     Objective:   Today's Vitals: BP 132/83   Pulse 87   Temp 98.7 F (37.1 C) (Oral)   Ht 5' 6.5" (1.689 m)   Wt (!) 305 lb (138.3 kg)   SpO2 97%   BMI 48.49 kg/m   Physical Exam Vitals and nursing note reviewed.  Constitutional:      General: She is awake. She is not in acute distress.     Appearance: She is well-developed and well-groomed. She is morbidly obese. She is not ill-appearing.  HENT:     Head: Normocephalic.     Right Ear: Hearing normal.     Left Ear: Hearing normal.  Eyes:     General: Lids are normal.  Right eye: No discharge.        Left eye: No discharge.     Conjunctiva/sclera: Conjunctivae normal.     Pupils: Pupils are equal, round, and reactive to light.  Neck:     Thyroid: No thyromegaly.     Vascular: No carotid bruit.  Cardiovascular:     Rate and Rhythm: Normal rate and regular rhythm.     Heart sounds: Normal heart sounds. No murmur heard.  No gallop.   Pulmonary:     Effort: Pulmonary effort is normal. No accessory muscle usage or respiratory distress.     Breath sounds: Normal breath sounds.  Abdominal:     General: Bowel sounds are normal.     Palpations: Abdomen is soft.  Musculoskeletal:     Cervical back: Normal range of motion and neck supple.     Right lower leg: No edema.     Left lower leg: No edema.  Lymphadenopathy:     Cervical: No cervical adenopathy.  Skin:    General: Skin is warm and dry.  Neurological:     Mental Status: She is alert and oriented to person, place, and time.  Psychiatric:        Attention and Perception: Attention normal.        Mood and Affect: Mood normal.        Speech: Speech normal.        Behavior: Behavior normal. Behavior is cooperative.        Thought Content: Thought content normal.    Assessment & Plan:   Problem List Items Addressed This Visit      Cardiovascular and Mediastinum   Hypertension associated with diabetes ([place])    Chronic, ongoing with BP at goal today in office.  Continue current medication regimen and adjust as needed.  Focus on DASH diet and regular exercise at home.  Recommend she check BP at few days a week at home and document for provider.  Referral to weight management at [facility] placed, she wishes to focus less on medications and more on weight loss to help  reduce her medication intake.  Return in 8 weeks and will obtain BMP next visit.        Respiratory   OSA on CPAP    Not consistently using, recommend 100% use of CPAP at home for overall health.        Endocrine   Adult hypothyroidism    Chronic, stable, managed by endocrinology.  Continue current medication regimen as prescribed by endocrinology and adjust as needed.  Recent labs obtained by endocrinology and within normal range.      Type 2 diabetes mellitus with morbid obesity (HCC)    Chronic, ongoing with recent A1C at endocrinology 6.9%.  Continue current medication regimen as prescribed by endocrinology and adjust as needed.  Recommend she attend diabetic education as instructed and monitor BS 2-3 times a day at home + document.  Educated on BS goals in morning and 2 hours after meals.  Referral to weight management at [facility] placed, she wishes to focus less on medications and more on weight loss to help reduce her medication intake.  Return in 8 weeks for follow-up.      Hyperlipidemia associated with type 2 diabetes mellitus (HCC)    Chronic, ongoing.  Continue fish oil.  Will check lipid panel next visit and discuss initiating statin, however her goal is medication reduction so unsure she will wish to start this.  Referral to weight management  at [facility] placed, she wishes to focus less on medications and more on weight loss to help reduce her medication intake.  Return in 8 weeks.        Nervous and Auditory   Meningioma ([place])    Followed by neurology, continue collaboration with neurology team and review notes.        Other   Bipolar I disorder, single manic episode (HCC)    Chronic, stable.  Denies SI/HI.  PHQ2 score -- 1.  Followed by psychiatry, will continue this collaboration and current medication regimen as prescribed by them.  She reports stable mood with Lamictal.  Attempt to obtain psychiatry notes.  She has concerns about menopause and would like labs checked, will  plan on checking these in 8 weeks at follow-up.      Obesity, Class III, BMI 40-49.9 (morbid obesity) ([place])    Referral to weight management at [facility] placed, she wishes to focus less on medications and more on weight loss to help reduce her medication intake.        Relevant Orders   Amb Ref to Medical Weight Management   Cough in adult patient    Ongoing, stable at this time.  Will continue collaboration with pulmonary and current medication regimen which includes Trelegy, Prilosec, and Tessalon.  Recent note reviewed.       Other Visit Diagnoses    Encounter to establish care    -  Primary      Outpatient Encounter Medications as of 12/24/2019  Medication Sig  . amLODipine (NORVASC) 5 MG tablet Take 1 tablet (5 mg total) by mouth daily.  . Ascorbic Acid (VITAMIN C) 1000 MG tablet Take 1,000 mg by mouth daily.  . benzonatate (TESSALON) 200 MG capsule Take 1 capsule (200 mg total) by mouth 3 (three) times daily as needed for cough.  . blood glucose meter kit and supplies by XX route as directed  . Cholecalciferol 5000 units capsule Take 1 capsule (5,000 Units total) by mouth daily.  . CONTOUR NEXT TEST test strip USE TO CHECK BS UP TO 3 TIMES DAILY FOR DIABETES DX: E11.9  . Dulaglutide 1.5 MG/0.5ML SOPN Inject 1.5 mg into the skin once a week.  . Fluticasone-Umeclidin-Vilant (TRELEGY ELLIPTA) 100-62.5-25 MCG/INH AEPB Inhale 1 puff into the lungs daily.  Marland Kitchen glipiZIDE (GLUCOTROL) 5 MG tablet Take 5 mg by mouth every morning.  . lamoTRIgine (LAMICTAL) 100 MG tablet Take 300 mg by mouth daily.  Marland Kitchen levothyroxine (SYNTHROID, LEVOTHROID) 175 MCG tablet TAKE 1 TABLET (175 MCG TOTAL) BY MOUTH DAILY.  Marland Kitchen losartan (COZAAR) 100 MG tablet TAKE 1/2 TABLET (50 MG TOTAL) BY MOUTH 2 (TWO) TIMES DAILY.  . metFORMIN (GLUCOPHAGE) 500 MG tablet Take 1 tablet (500 mg total) by mouth 3 (three) times daily. (Patient taking differently: Take 1,000 mg by mouth 2 (two) times daily with a meal. )  . mometasone  (ELOCON) 0.1 % cream 1(ONE) APPLICATION(S) TOPICAL FOR SPOT TREATING  . Multiple Vitamin (MULTIVITAMIN) tablet Take 1 tablet by mouth daily.  . naproxen (NAPROSYN) 375 MG tablet Take 1 tablet (375 mg total) by mouth 2 (two) times daily.  . Omega-3 Fatty Acids (FISH OIL) 1200 MG CAPS Take 1,200 mg by mouth daily.   Marland Kitchen omeprazole (PRILOSEC) 40 MG capsule Take 1 capsule (40 mg total) by mouth daily.  . ondansetron (ZOFRAN ODT) 4 MG disintegrating tablet Take 1 tablet (4 mg total) by mouth every 8 (eight) hours as needed for nausea or vomiting.  . [  DISCONTINUED] albuterol (VENTOLIN HFA) 108 (90 Base) MCG/ACT inhaler Inhale 2 puffs into the lungs every 4 (four) hours as needed for wheezing or shortness of breath.  . [DISCONTINUED] lamoTRIgine (LAMICTAL) 150 MG tablet Take 300 mg by mouth daily.  . [DISCONTINUED] triamcinolone (NASACORT ALLERGY 24HR) 55 MCG/ACT AERO nasal inhaler Place 2 sprays into the nose daily.   No facility-administered encounter medications on file as of 12/24/2019.    Follow-up: Return in about 8 weeks (around 02/18/2020) for T2DM, HTN/HLD, MOOD, WEIGHT.   [name], NP

## 2019-12-24 NOTE — Assessment & Plan Note (Signed)
Chronic, ongoing.  Continue fish oil.  Will check lipid panel next visit and discuss initiating statin, however her goal is medication reduction so unsure she will wish to start this.  Referral to weight management at [facility] placed, she wishes to focus less on medications and more on weight loss to help reduce her medication intake.  Return in 8 weeks.

## 2019-12-24 NOTE — Assessment & Plan Note (Signed)
Chronic, ongoing with recent A1C at endocrinology 6.9%.  Continue current medication regimen as prescribed by endocrinology and adjust as needed.  Recommend she attend diabetic education as instructed and monitor BS 2-3 times a day at home + document.  Educated on BS goals in morning and 2 hours after meals.  Referral to weight management at [facility] placed, she wishes to focus less on medications and more on weight loss to help reduce her medication intake.  Return in 8 weeks for follow-up.

## 2019-12-24 NOTE — Assessment & Plan Note (Signed)
Followed by neurology, continue collaboration with neurology team and review notes.

## 2019-12-24 NOTE — Assessment & Plan Note (Addendum)
Chronic, stable.  Denies SI/HI.  PHQ2 score -- 1.  Followed by psychiatry, will continue this collaboration and current medication regimen as prescribed by them.  She reports stable mood with Lamictal.  Attempt to obtain psychiatry notes.  She has concerns about menopause and would like labs checked, will plan on checking these in 8 weeks at follow-up.

## 2019-12-24 NOTE — Assessment & Plan Note (Signed)
Not consistently using, recommend 100% use of CPAP at home for overall health.

## 2020-01-02 ENCOUNTER — Other Ambulatory Visit: Payer: Self-pay

## 2020-01-02 ENCOUNTER — Encounter: Payer: Self-pay | Admitting: *Deleted

## 2020-01-02 ENCOUNTER — Encounter: Payer: BC Managed Care – PPO | Attending: Surgery | Admitting: *Deleted

## 2020-01-02 VITALS — BP 150/98 | Ht 66.0 in | Wt 297.7 lb

## 2020-01-02 DIAGNOSIS — E119 Type 2 diabetes mellitus without complications: Secondary | ICD-10-CM

## 2020-01-02 DIAGNOSIS — E1165 Type 2 diabetes mellitus with hyperglycemia: Secondary | ICD-10-CM | POA: Diagnosis not present

## 2020-01-02 NOTE — Progress Notes (Signed)
Diabetes Self-Management Education  Visit Type: First/Initial  Appt. Start Time: 0850 Appt. End Time: 1025  01/02/2020  [name], identified by name and date of birth, is a 49 y.o. female with a diagnosis of Diabetes: Type 2.   ASSESSMENT  Blood pressure (!) 150/98, height 5\' 6"  (1.676 m), weight 297 lb 11.2 oz (135 kg). Body mass index is 48.05 kg/m.   Diabetes Self-Management Education - 01/02/20 1101      Visit Information   Visit Type First/Initial      Initial Visit   Diabetes Type Type 2    Are you currently following a meal plan? Yes    What type of meal plan do you follow? no fried foods    Are you taking your medications as prescribed? Yes    Date Diagnosed "a long time"      Health Coping   How would you rate your overall health? Poor      Psychosocial Assessment   Patient Belief/Attitude about Diabetes Other (comment)   "ugh"   Self-care barriers None    Self-management support Doctor's office;Family    Patient Concerns Nutrition/Meal planning;Medication;Monitoring;Healthy Lifestyle;Problem Solving;Glycemic Control;Weight Control    Special Needs None    Preferred Learning Style Auditory;Visual;Hands on    [place] in progress    How often do you need to have someone help you when you read instructions, pamphlets, or other written materials from your doctor or pharmacy? 1 - Never    What is the last grade level you completed in school? GED      Pre-Education Assessment   Patient understands the diabetes disease and treatment process. Needs Instruction    Patient understands incorporating nutritional management into lifestyle. Needs Review    Patient undertands incorporating physical activity into lifestyle. Needs Instruction    Patient understands using medications safely. Needs Instruction    Patient understands monitoring blood glucose, interpreting and using results Needs Review    Patient understands prevention, detection, and  treatment of acute complications. Needs Instruction    Patient understands prevention, detection, and treatment of chronic complications. Needs Instruction    Patient understands how to develop strategies to address psychosocial issues. Needs Instruction    Patient understands how to develop strategies to promote health/change behavior. Needs Instruction      Complications   Last HgB A1C per patient/outside source 6.9 %   12/05/2019   How often do you check your blood sugar? 0 times/day (not testing)   Pt brought her meter but was having trouble using it correctly. She has sent it back to the company. Instructed on use. BG upon return demonstration was 71 mg/dL at 10:10 am - fasting. Pt had taken her Glipizide this morning.   Number of hypoglycemic episodes per month --   Pt questions if she is having low blood sugars. She usually takes her Glipizide and walks on treadmill. Then she reports a strange feeling and she gets a protein drink and feels better. Instructed her to check blood sugar when this happens.   Have you had a dilated eye exam in the past 12 months? Yes    Have you had a dental exam in the past 12 months? Yes    Are you checking your feet? Yes    How many days per week are you checking your feet? 1      Dietary Intake   Breakfast toast or fruit; hashbrown and large Sprite    Snack (morning) 0-1 snack/day -  cherries, tangerine, bananas    Lunch [place] sandwich, wings, fast food - reports eating 6-7 servings of fried foods per week    Dinner chicken or beef, occasional corn, beans, rice, broccoli, cauliflower, carrots, potatoes, salads    Beverage(s) water, Sprite 3-4 x week, unsweetened tea      Exercise   Exercise Type Light (walking / raking leaves)    How many days per week to you exercise? 4    How many minutes per day do you exercise? 30    Total minutes per week of exercise 120      Patient Education   Previous Diabetes Education Yes (please comment)   2017 and 2019 in  this clinic - didn't complete classes   Disease state  Definition of diabetes, type 1 and 2, and the diagnosis of diabetes;Factors that contribute to the development of diabetes    Nutrition management  Role of diet in the treatment of diabetes and the relationship between the three main macronutrients and blood glucose level;Food label reading, portion sizes and measuring food.;Carbohydrate counting;Reviewed blood glucose goals for pre and post meals and how to evaluate the patients' food intake on their blood glucose level.;Meal timing in regards to the patients' current diabetes medication.;Other (comment)   take Glipizide 30 minutes before eating breakfast   Physical activity and exercise  Role of exercise on diabetes management, blood pressure control and cardiac health.;Identified with patient nutritional and/or medication changes necessary with exercise.   Exercise an hour after eating   Medications Reviewed patients medication for diabetes, action, purpose, timing of dose and side effects.    Monitoring Taught/evaluated SMBG meter.;Purpose and frequency of SMBG.;Taught/discussed recording of test results and interpretation of SMBG.;Identified appropriate SMBG and/or A1C goals.    Acute complications Taught treatment of hypoglycemia - the 15 rule.    Chronic complications Relationship between chronic complications and blood glucose control    Psychosocial adjustment Role of stress on diabetes;Identified and addressed patients feelings and concerns about diabetes      Individualized Goals (developed by patient)   Reducing Risk Other (comment)   improve blood sugars, decrease medications, prevent diabetes complications, lose weight, lead a healthier lifestyle, become more fit     Outcomes   Expected Outcomes Demonstrated interest in learning. Expect positive outcomes           Individualized Plan for Diabetes Self-Management Training:   Learning Objective:  Patient will have a greater  understanding of diabetes self-management. Patient education plan is to attend individual and/or group sessions per assessed needs and concerns.   Plan:   Patient Instructions  Check blood sugars 1 x day before breakfast or 2 hrs after one meal every day Exercise: Continue walking for 20-40  minutes 4 days a week (total of 150 minutes/week) Eat 3 meals day, 1-2  snacks a day Space meals 4-6 hours apart Limit fried foods Avoid sugar sweetened drinks (soda, juices) Carry fast acting glucose and a snack at all times Call back if you want to schedule classes or an appointment with the nurse or dietitian  Expected Outcomes:  Demonstrated interest in learning. Expect positive outcomes  Education material provided:  General Meal Planning Guidelines Simple Meal Plan Quick and Balanced Meals Combination and Fast Food Lists Glucose tablets Symptoms, causes and treatments of Hypoglycemia  If problems or questions, patient to contact team via:  [name], RN, [place], [place] [phone]  Future DSME appointment:  Patient wants to wait and see if insurance will  cover this visit before scheduling classes or an additional appointment with this nurse or dietitian.

## 2020-01-02 NOTE — Patient Instructions (Signed)
Check blood sugars 1 x day before breakfast or 2 hrs after one meal every day  Exercise: Continue walking for 20-40  minutes 4 days a week (total of 150 minutes/week)  Eat 3 meals day, 1-2  snacks a day Space meals 4-6 hours apart Limit fried foods Avoid sugar sweetened drinks (soda, juices)  Carry fast acting glucose and a snack at all times  Call back if you want to schedule classes or an appointment with the nurse or dietitian

## 2020-01-03 ENCOUNTER — Ambulatory Visit: Payer: BC Managed Care – PPO | Admitting: *Deleted

## 2020-01-23 NOTE — Progress Notes (Deleted)
PTNS  Session # ***  Health & Social Factors: *** Caffeine: *** Alcohol: *** Daytime voids #per day: *** Night-time voids #per night: *** Urgency: *** Incontinence Episodes #per day: *** Ankle used: *** Treatment Setting: *** Feeling/ Response: *** Comments: ***  Preformed By: ***  Assistant: ***  Follow Up: ***

## 2020-01-24 ENCOUNTER — Ambulatory Visit: Payer: BC Managed Care – PPO | Admitting: Urology

## 2020-01-24 ENCOUNTER — Encounter: Payer: Self-pay | Admitting: Urology

## 2020-01-28 ENCOUNTER — Other Ambulatory Visit: Payer: Self-pay | Admitting: Nurse Practitioner

## 2020-01-28 DIAGNOSIS — F411 Generalized anxiety disorder: Secondary | ICD-10-CM | POA: Diagnosis not present

## 2020-01-28 DIAGNOSIS — K219 Gastro-esophageal reflux disease without esophagitis: Secondary | ICD-10-CM

## 2020-01-28 DIAGNOSIS — F319 Bipolar disorder, unspecified: Secondary | ICD-10-CM | POA: Diagnosis not present

## 2020-01-28 MED ORDER — OMEPRAZOLE 40 MG PO CPDR: 40.0000 mg | DELAYED_RELEASE_CAPSULE | Freq: Every day | ORAL | 4 refills | Status: DC

## 2020-01-30 ENCOUNTER — Ambulatory Visit: Payer: BC Managed Care – PPO | Admitting: Internal Medicine

## 2020-02-13 DIAGNOSIS — G4733 Obstructive sleep apnea (adult) (pediatric): Secondary | ICD-10-CM | POA: Diagnosis not present

## 2020-02-18 ENCOUNTER — Encounter: Payer: Self-pay | Admitting: *Deleted

## 2020-02-19 ENCOUNTER — Ambulatory Visit: Payer: Self-pay

## 2020-02-20 ENCOUNTER — Encounter: Payer: Self-pay | Admitting: Nurse Practitioner

## 2020-02-20 ENCOUNTER — Other Ambulatory Visit: Payer: Self-pay

## 2020-02-20 ENCOUNTER — Ambulatory Visit (INDEPENDENT_AMBULATORY_CARE_PROVIDER_SITE_OTHER): Payer: BC Managed Care – PPO | Admitting: Nurse Practitioner

## 2020-02-20 DIAGNOSIS — R809 Proteinuria, unspecified: Secondary | ICD-10-CM

## 2020-02-20 DIAGNOSIS — E1159 Type 2 diabetes mellitus with other circulatory complications: Secondary | ICD-10-CM

## 2020-02-20 DIAGNOSIS — Z9989 Dependence on other enabling machines and devices: Secondary | ICD-10-CM

## 2020-02-20 DIAGNOSIS — M25521 Pain in right elbow: Secondary | ICD-10-CM

## 2020-02-20 DIAGNOSIS — E1169 Type 2 diabetes mellitus with other specified complication: Secondary | ICD-10-CM | POA: Diagnosis not present

## 2020-02-20 DIAGNOSIS — F309 Manic episode, unspecified: Secondary | ICD-10-CM | POA: Diagnosis not present

## 2020-02-20 DIAGNOSIS — G4733 Obstructive sleep apnea (adult) (pediatric): Secondary | ICD-10-CM

## 2020-02-20 DIAGNOSIS — I10 Essential (primary) hypertension: Secondary | ICD-10-CM

## 2020-02-20 DIAGNOSIS — E039 Hypothyroidism, unspecified: Secondary | ICD-10-CM

## 2020-02-20 DIAGNOSIS — E785 Hyperlipidemia, unspecified: Secondary | ICD-10-CM

## 2020-02-20 DIAGNOSIS — E1129 Type 2 diabetes mellitus with other diabetic kidney complication: Secondary | ICD-10-CM | POA: Diagnosis not present

## 2020-02-20 DIAGNOSIS — I152 Hypertension secondary to endocrine disorders: Secondary | ICD-10-CM

## 2020-02-20 LAB — MICROALBUMIN, URINE WAIVED
Creatinine, Urine Waived: 200 mg/dL (ref 10–300)
Microalb, Ur Waived: 80 mg/L — ABNORMAL HIGH (ref 0–19)

## 2020-02-20 NOTE — Assessment & Plan Note (Signed)
Chronic, ongoing with BP at goal today in office.  Continue current medication regimen and adjust as needed.  Focus on DASH diet and regular exercise at home.  Recommend she check BP at few days a week at home and document for provider.  Referral to weight management at [facility] has been placed, she wishes to focus less on medications and more on weight loss to help reduce her medication intake.  Return in 3 months.

## 2020-02-20 NOTE — Assessment & Plan Note (Signed)
Chronic, stable, managed by endocrinology.  Continue current medication regimen as prescribed by endocrinology and adjust as needed.  Recent labs obtained by endocrinology and within normal range. 

## 2020-02-20 NOTE — Assessment & Plan Note (Signed)
Praised for 11 pounds weight loss.  She is on wait list for [facility] weight management.  Recommended eating smaller high protein, low fat meals more frequently and exercising 30 mins a day 5 times a week with a goal of 10-15lb weight loss in the next 3 months. Patient voiced their understanding and motivation to adhere to these recommendations.

## 2020-02-20 NOTE — Progress Notes (Signed)
BP 137/82 (BP Location: Right Arm, Cuff Size: Normal)    Pulse (!) 101    Temp 98.9 F (37.2 C) (Oral)    Wt 294 lb 3.2 oz (133.4 kg)    SpO2 96%    BMI 47.49 kg/m    Subjective:    Patient ID: Lori Knight, female    DOB: 1970/10/12, 49 y.o.   MRN: [id number]  HPI: Lori Knight is a 49 y.o. female  Chief Complaint  Patient presents with   Depression   Diabetes   Hyperlipidemia   Hypertension   PT in [place] Followed by endocrinology and last seen 12/13/2019 with her A1C 6.9%. Continues on Metformin 1000 MG BID (intolerant to higher dosing), Trulicity 1.5 MG weekly, and Glimepiride 5 MG daily.  Her goal is weight loss, she reports currently being size 24 and that when she lost weight before and was size 14 she was able to come off a lot of her medications, which is her goal at this time.  Referral to [facility] weight management placed last visit, is on waiting list.   Hypoglycemic episodes:no Polydipsia/polyuria: occasional, but sees urology for nocturia issues Visual disturbance: no Chest pain: no Paresthesias: no Glucose Monitoring: no             Accucheck frequency: Not Checking             Fasting glucose:             Post prandial:             Evening:             Before meals: Taking Insulin?: no             Long acting insulin:             Short acting insulin: Blood Pressure Monitoring: not checking Retinal Examination: Up to Date Foot Exam: Up to Date Diabetic Education: Completed Pneumovax: Up to Date Influenza: Up to Date Aspirin: no   HYPOTHYROIDISM Followed by endocrinology and continues on Levothyroxine 175 MCG 6 days per week, takes 2 tablets on Sunday.  TSH on 12/05/19 was 1.527. Thyroid control status:stable Satisfied with current treatment? yes Medication side effects: no Medication compliance: good compliance Etiology of hypothyroidism:  Recent dose adjustment:no Fatigue: no Cold intolerance: no Heat intolerance: no Weight  gain: no Weight loss: no Constipation: no Diarrhea/loose stools: no Palpitations: no Lower extremity edema: no Anxiety/depressed mood: no  HYPERTENSION / HYPERLIPIDEMIA Continues on Losartan 100 MG, Amlodipine 5 MG and no current statin, but does use Fish Oil.  Has CPAP and is now actively using for 2 weeks, last visit was using only 25% of the time.    Her last echo in 2019 noted EF 50-55%.  Is currently being followed by pulmonary for a chronic cough, currently using Trelegy (not using all the time, uses 3-4 times a week) and Tessalon + Prilosec.  Has history of smoking, smoked for 9 years and quit in 2004. Satisfied with current treatment? yes Duration of hypertension: chronic BP monitoring frequency: not checking BP range:  BP medication side effects: no Duration of hyperlipidemia: chronic Aspirin: no Recent stressors: no Recurrent headaches: no Visual changes: no Palpitations: no Dyspnea: no Chest pain: no Lower extremity edema: no Dizzy/lightheaded: no  The ASCVD Risk score Mikey Bussing DC Jr., et al., 2013) failed to calculate for the following reasons:   The patient has a prior MI or stroke diagnosis  ELBOW PAIN About  two weeks ago right elbow became mildly uncomfortable to bend, went to urgent care yesterday but did not stay due to wait.  Reports it has gotten worse and radiates down in hand.  The more she tries not to use it, the more it hurts.  Right hand dominant. Duration: weeks Location: elbow Mechanism of injury: unknown Onset: sudden Severity: 5/10  Quality:  dull and aching Frequency: intermittent Radiation: yes Aggravating factors: flexion of arm, lifting, reaching up Alleviating factors: Naproxen in past, but stopped this due to swelling Status: worse Treatments attempted: none  Relief with NSAIDs?:  No NSAIDs Taken Swelling: no Redness: no  Warmth: no Trauma: no Chest pain: no  Shortness of breath: no  Fever: no Decreased sensation: no Paresthesias:  no Weakness: no  BIPOLAR I DISORDER Goes to psychiatry at this time.  Continues on Lamictal 300 MG daily. About 13 months ago her mother shot herself and she does endorse that this is when weight gain became more prevalent in her life.  This is first suicide in her family.  Has seen neurology and has small meningioma, is following up with them in July. Mood status: stable Satisfied with current treatment?: yes Symptom severity: mild  Duration of current treatment : chronic Side effects: no Medication compliance: good compliance Psychotherapy/counseling: none Depressed mood: no Anxious mood: no Anhedonia: no Significant weight loss or gain: no Insomnia: none Fatigue: no Feelings of worthlessness or guilt: no Impaired concentration/indecisiveness: yes Suicidal ideations: no Hopelessness: no Crying spells: no Depression screen [facility] 2/9 02/20/2020 01/02/2020 12/24/2019 08/13/2019 06/20/2019  Decreased Interest 0 0 0 0 1  Down, Depressed, Hopeless 0 0 1 0 1  PHQ - 2 Score 0 0 1 0 2  Altered sleeping 0 - - 0 1  Tired, decreased energy 1 - - 0 1  Change in appetite 1 - - 2 1  Feeling bad or failure about yourself  0 - - 0 0  Trouble concentrating 0 - - 0 0  Moving slowly or fidgety/restless 0 - - 0 0  Suicidal thoughts 0 - - 0 0  PHQ-9 Score 2 - - 2 5  Difficult doing work/chores Not difficult at all - - Not difficult at all Not difficult at all    Relevant past medical, surgical, family and social history reviewed and updated as indicated. Interim medical history since our last visit reviewed. Allergies and medications reviewed and updated.  Review of Systems  Constitutional: Negative for activity change, appetite change, diaphoresis, fatigue and fever.  Respiratory: Negative for cough, chest tightness and shortness of breath.   Cardiovascular: Negative for chest pain, palpitations and leg swelling.  Gastrointestinal: Negative.   Endocrine: Negative for cold intolerance, heat  intolerance, polydipsia, polyphagia and polyuria.  Musculoskeletal: Positive for arthralgias.  Neurological: Negative.   Psychiatric/Behavioral: Negative.     Per HPI unless specifically indicated above     Objective:    BP 137/82 (BP Location: Right Arm, Cuff Size: Normal)    Pulse (!) 101    Temp 98.9 F (37.2 C) (Oral)    Wt 294 lb 3.2 oz (133.4 kg)    SpO2 96%    BMI 47.49 kg/m   Wt Readings from Last 3 Encounters:  02/20/20 294 lb 3.2 oz (133.4 kg)  01/02/20 297 lb 11.2 oz (135 kg)  12/24/19 (!) 305 lb (138.3 kg)    Physical Exam Vitals and nursing note reviewed.  Constitutional:      General: She is awake. She is not  in acute distress.    Appearance: She is well-developed and well-groomed. She is morbidly obese. She is not ill-appearing.  HENT:     Head: Normocephalic.     Right Ear: Hearing normal.     Left Ear: Hearing normal.  Eyes:     General: Lids are normal.        Right eye: No discharge.        Left eye: No discharge.     Conjunctiva/sclera: Conjunctivae normal.     Pupils: Pupils are equal, round, and reactive to light.  Neck:     Thyroid: No thyromegaly.     Vascular: No carotid bruit.  Cardiovascular:     Rate and Rhythm: Normal rate and regular rhythm.     Heart sounds: Normal heart sounds. No murmur heard.  No gallop.   Pulmonary:     Effort: Pulmonary effort is normal. No accessory muscle usage or respiratory distress.     Breath sounds: Normal breath sounds.  Abdominal:     General: Bowel sounds are normal.     Palpations: Abdomen is soft.  Musculoskeletal:     Right elbow: No swelling, deformity or lacerations. Decreased range of motion. Tenderness present in medial epicondyle.     Left elbow: Normal.     Cervical back: Normal range of motion and neck supple.     Right lower leg: No edema.     Left lower leg: No edema.     Comments: Decreased ROM to right elbow with discomfort on flexion and external and internal rotation.  Grip strength and  sensation equal bilaterally.  Lymphadenopathy:     Cervical: No cervical adenopathy.  Skin:    General: Skin is warm and dry.  Neurological:     Mental Status: She is alert and oriented to person, place, and time.  Psychiatric:        Attention and Perception: Attention normal.        Mood and Affect: Mood normal.        Speech: Speech normal.        Behavior: Behavior normal. Behavior is cooperative.        Thought Content: Thought content normal.     Results for orders placed or performed in visit on 02/20/20  Microalbumin, Urine Waived  Result Value Ref Range   Microalb, Ur Waived 80 (H) 0 - 19 mg/L   Creatinine, Urine Waived 200 10 - 300 mg/dL   Microalb/Creat Ratio 30-300 (H) <30 mg/g      Assessment & Plan:   Problem List Items Addressed This Visit      Cardiovascular and Mediastinum   Hypertension associated with diabetes ([place])    Chronic, ongoing with BP at goal today in office.  Continue current medication regimen and adjust as needed.  Focus on DASH diet and regular exercise at home.  Recommend she check BP at few days a week at home and document for provider.  Referral to weight management at [facility] has been placed, she wishes to focus less on medications and more on weight loss to help reduce her medication intake.  Return in 3 months.      Relevant Orders   Basic metabolic panel   Microalbumin, Urine Waived (Completed)     Respiratory   OSA on CPAP    Has increased and become more consistent with CPAP use, praised for this, is feeling better with this increased use.  Recommend 100% use of CPAP.  Endocrine   Adult hypothyroidism    Chronic, stable, managed by endocrinology.  Continue current medication regimen as prescribed by endocrinology and adjust as needed.  Recent labs obtained by endocrinology and within normal range.      Type 2 diabetes mellitus with morbid obesity (HCC) - Primary    Chronic, ongoing with recent A1C at endocrinology 6.9%.   Continue current medication regimen as prescribed by endocrinology and adjust as needed.  Educated on BS goals in morning and 2 hours after meals.  Referral to weight management at [facility] has been placed, she wishes to focus less on medications and more on weight loss to help reduce her medication intake.  Return in 3 months.      Relevant Orders   Basic metabolic panel   Microalbumin, Urine Waived (Completed)   Hyperlipidemia associated with type 2 diabetes mellitus ([place])   Relevant Orders   Lipid Panel w/o Chol/HDL Ratio   Type 2 diabetes mellitus with proteinuria (HCC)    Chronic, ongoing with recent A1C at endocrinology 6.9%.  Urine ALB 80 and A:C 30-300 today.  Obtain BMP, continue Losartan for kidney protection.  Continue current medication regimen as prescribed by endocrinology and adjust as needed.  Educated on BS goals in morning and 2 hours after meals.  Referral to weight management at [facility] has been placed, she wishes to focus less on medications and more on weight loss to help reduce her medication intake.  Return in 3 months.        Other   Bipolar I disorder, single manic episode (HCC)    Chronic, stable.  Denies SI/HI.  Followed by psychiatry, will continue this collaboration and current medication regimen as prescribed by them.  She reports stable mood with Lamictal.  Attempt to obtain psychiatry notes.  Return in 3 months.      Obesity, Class III, BMI 40-49.9 (morbid obesity) ([place])    Praised for 11 pounds weight loss.  She is on wait list for [facility] weight management.  Recommended eating smaller high protein, low fat meals more frequently and exercising 30 mins a day 5 times a week with a goal of 10-15lb weight loss in the next 3 months. Patient voiced their understanding and motivation to adhere to these recommendations.       Right elbow pain    Acute, ? nerve related.  Recommend use of Diclofenac gel, Tylenol, and ice as needed at home.  Wear an elbow support when working.  She  prefers to defer ortho or imaging.  Would like referral to PT, will order today and plan to follow-up in office if worsening or ongoing.      Relevant Orders   Ambulatory referral to Physical Therapy       Follow up plan: Return in about 3 months (around 05/22/2020) for T2DM, HTN/HLD, .

## 2020-02-20 NOTE — Assessment & Plan Note (Signed)
Chronic, ongoing with recent A1C at endocrinology 6.9%.  Continue current medication regimen as prescribed by endocrinology and adjust as needed.  Educated on BS goals in morning and 2 hours after meals.  Referral to weight management at [facility] has been placed, she wishes to focus less on medications and more on weight loss to help reduce her medication intake.  Return in 3 months.

## 2020-02-20 NOTE — Patient Instructions (Signed)
Vitamin B12 500 to 1000 MCG daily  Pitcher's Elbow  Pitcher's elbow is a type of elbow injury that develops gradually over time (overuse injury). This condition is also called valgus extension overload syndrome (VEOS). This injury is common in athletes who make repeated overhead throwing motions, such as a baseball pitcher repeatedly throwing a ball. Throwing motions can:  Overstretch the strong band of tissue (ligament) on the inside of the elbow (ulnar collateral ligament, or UCL). UCL injuries can range from minor inflammation to a complete ligament tear.  Push the bones at the outside and back of the elbow together (compression). These forces can injure the ligament over time and create an abnormal extra bone in the elbow (osteophyte or bone spur). What are the causes? This condition may be caused by:  Continuous or repetitive overhead throwing, such as baseball pitching.  Improper throwing motion.  Tightness in the shoulder that puts added demand on the elbow. What increases the risk? This condition is more likely to develop in athletes who play sports that involve repetitive forceful straightening of the elbow, such as:  Baseball or softball.  Tennis and other racquet sports.  Football.  Lacrosse.  Gymnastics.  [place]avelin. What are the signs or symptoms? Symptoms of this condition include:  Elbow pain.  Increased pain in your elbow as you straighten your arm forcefully, such as when throwing.  Swelling.  Limited range of motion or "locking" of the elbow.  Popping or tearing sensation in your elbow. How is this diagnosed? This condition is diagnosed based on:  Your symptoms and medical history.  A physical exam. Your health care provider may: ? Check your elbow's strength, stability, and range of motion. ? Compare your injured elbow to your other elbow. ? Gently press your arm and elbow to find the source of pain.  Imaging tests, such as: ? X-rays or CT  scans to check for stress fractures and bone spurs. ? Ultrasound or MRI to check for tears in the ligaments or tendons. How is this treated? Treatment for this condition may include:  Stopping activities that require overhead arm motions, then returning gradually to full activities.  Modifying your sports technique to decrease elbow strain. This may include wearing a brace.  Taking medicine to relieve pain.  Injecting medicines (corticosteroids) into your elbow to reduce swelling and pain.  Doing strength and range-of-motion exercises (physical therapy) as told by your health care provider.  Surgery. This may be needed if all other treatments do not work. It may involve: ? Repairing damaged ligaments. ? Removing abnormal bone growths. ? Removing pieces of bone or cartilage. After surgery, you will have to wear a brace for several weeks and eventually have physical therapy. Follow these instructions at home: If you have a brace:  Wear the brace as told by your health care provider. Remove it only as told by your health care provider.  Loosen the brace if your fingers tingle, become numb, or turn cold and blue.  Keep the brace clean.  If the brace is not waterproof: ? Do not let it get wet. ? Cover it with a watertight covering when you take a bath or a shower. Managing pain, stiffness, and swelling   If directed, put ice on the injured area. ? If you have a removable brace, remove it as told by your health care provider. ? Put ice in a plastic bag. ? Place a towel between your skin and the bag. ? Leave the ice on for  20 minutes, 2-3 times a day.  Move your fingers often to avoid stiffness and to lessen swelling.  Raise (elevate) the injured area above the level of your heart while you are sitting or lying down. Activity  Rest your elbow and avoid activities that require overhead arm motions as told by your health care provider.  Return to your normal activities as told  by your health care provider. Ask your health care provider what activities are safe for you.  Do exercises as told by your health care provider. General instructions  Do not use any products that contain nicotine or tobacco, such as cigarettes, e-cigarettes, and chewing tobacco. These can delay healing. If you need help quitting, ask your health care provider.  Take over-the-counter and prescription medicines only as told by your health care provider.  Keep all follow-up visits as told by your health care provider. This is important. How is this prevented?  Warm up and stretch before being active.  Cool down and stretch after being active.  Give your body time to rest between periods of activity.  Maintain physical fitness, including: ? Strength. ? Flexibility.  Have your technique checked to make sure you use proper form.  Rest your elbow if you show signs of fatigue.  When you start any new athletic activity, increase your participation slowly.  Follow the rules for your sport on how often and how much you can throw in a game.  Avoid overhead throwing motions as told by your health care provider. Contact a health care provider if:  Your pain does not improve or it gets worse after 2-4 weeks of rest. Get help right away if:  Your pain is severe.  You cannot move your arm or elbow. These symptoms may represent a serious problem that is an emergency. Do not wait to see if the symptoms will go away. Get medical help right away. Call your local emergency services (911 in the [place] Do not drive yourself to the hospital. Summary  Pitcher's elbow is a type of elbow injury that develops gradually over time.  Treatment depends on the severity of the injury.  Rest your elbow and avoid activities that require overhead arm motions as told by your health care provider.  If directed, put ice on the injured area. This information is not intended to replace advice given to you by  your health care provider. Make sure you discuss any questions you have with your health care provider. Document Revised: 10/19/2018 Document Reviewed: 12/22/2017 Elsevier Patient Education  [place].

## 2020-02-20 NOTE — Assessment & Plan Note (Signed)
Chronic, ongoing with recent A1C at endocrinology 6.9%.  Urine ALB 80 and A:C 30-300 today.  Obtain BMP, continue Losartan for kidney protection.  Continue current medication regimen as prescribed by endocrinology and adjust as needed.  Educated on BS goals in morning and 2 hours after meals.  Referral to weight management at [facility] has been placed, she wishes to focus less on medications and more on weight loss to help reduce her medication intake.  Return in 3 months.

## 2020-02-20 NOTE — Assessment & Plan Note (Signed)
Acute, ? nerve related.  Recommend use of Diclofenac gel, Tylenol, and ice as needed at home.  Wear an elbow support when working.  She prefers to defer ortho or imaging.  Would like referral to PT, will order today and plan to follow-up in office if worsening or ongoing.

## 2020-02-20 NOTE — Assessment & Plan Note (Signed)
Chronic, stable.  Denies SI/HI.  Followed by psychiatry, will continue this collaboration and current medication regimen as prescribed by them.  She reports stable mood with Lamictal.  Attempt to obtain psychiatry notes.  Return in 3 months.

## 2020-02-20 NOTE — Assessment & Plan Note (Signed)
Has increased and become more consistent with CPAP use, praised for this, is feeling better with this increased use.  Recommend 100% use of CPAP.

## 2020-02-21 LAB — BASIC METABOLIC PANEL
BUN/Creatinine Ratio: 18 (ref 9–23)
BUN: 16 mg/dL (ref 6–24)
CO2: 22 mmol/L (ref 20–29)
Calcium: 9.4 mg/dL (ref 8.7–10.2)
Chloride: 102 mmol/L (ref 96–106)
Creatinine, Ser: 0.91 mg/dL (ref 0.57–1.00)
GFR calc Af Amer: 85 mL/min/{1.73_m2} (ref >59)
GFR calc non Af Amer: 74 mL/min/{1.73_m2} (ref >59)
Glucose: 129 mg/dL — ABNORMAL HIGH (ref 65–99)
Potassium: 4.1 mmol/L (ref 3.5–5.2)
Sodium: 141 mmol/L (ref 134–144)

## 2020-02-21 LAB — LIPID PANEL W/O CHOL/HDL RATIO
Cholesterol, Total: 213 mg/dL — ABNORMAL HIGH (ref 100–199)
HDL: 36 mg/dL — ABNORMAL LOW (ref >39)
LDL Chol Calc (NIH): 119 mg/dL — ABNORMAL HIGH (ref 0–99)
Triglycerides: 332 mg/dL — ABNORMAL HIGH (ref 0–149)
VLDL Cholesterol Cal: 58 mg/dL — ABNORMAL HIGH (ref 5–40)

## 2020-02-21 NOTE — Progress Notes (Signed)
Contacted via [place] morning Zelia, your labs have returned.  Kidney function remains stable and electrolytes.  Cholesterol levels remain elevated.  Guidelines do recommend all diabetics be on a statin due to increased risk for stroke and heart attack, but I know your wish is focus on diet and weight loss.  We will continue to monitor these and see how your journey with [facility] goes -- I think it will be amazing and look forward to being part of the journey.  Our goal is to get the triglycerides down and get the LDL <70.  If any questions let me know. Keep being awesome!! Kindest regards, Nathanyel Defenbaugh

## 2020-03-11 ENCOUNTER — Telehealth: Payer: Self-pay | Admitting: Nurse Practitioner

## 2020-03-11 NOTE — Telephone Encounter (Signed)
Copied from [place] [id number]. Topic: Complaint - Billing/Coding >> Mar 11, 2020  2:30 PM Scherrie Gerlach wrote: DOS: 12/24/2019 Details of complaint: pt states she has spoken to [facility] and the code used for her visit,  Z76.89, will not every be paid.  She got a bill for basically a new pt visit, which it was. How would the patient like to see this issue resolved? She is requesting a different specific dx code be used for one of her health issues, and remove this code and the billing for it. Pt states she will not stay with a company that purposely bills knowing it will not be paid  Route to Engineer, building services.

## 2020-03-19 DIAGNOSIS — E1165 Type 2 diabetes mellitus with hyperglycemia: Secondary | ICD-10-CM | POA: Diagnosis not present

## 2020-03-19 DIAGNOSIS — E039 Hypothyroidism, unspecified: Secondary | ICD-10-CM | POA: Diagnosis not present

## 2020-03-19 LAB — HEMOGLOBIN A1C: Hemoglobin A1C: 6.7

## 2020-04-02 ENCOUNTER — Other Ambulatory Visit: Payer: Self-pay | Admitting: Nurse Practitioner

## 2020-04-08 IMAGING — MG DIGITAL SCREENING BILAT W/ TOMO W/ CAD
8 of 17 series · 8 of 40 positions shown · non-contrast
Comparison: Previous exam(s).

CLINICAL DATA: Screening.

EXAM:
DIGITAL SCREENING BILATERAL MAMMOGRAM WITH TOMO AND CAD

[L CC synth-2D]
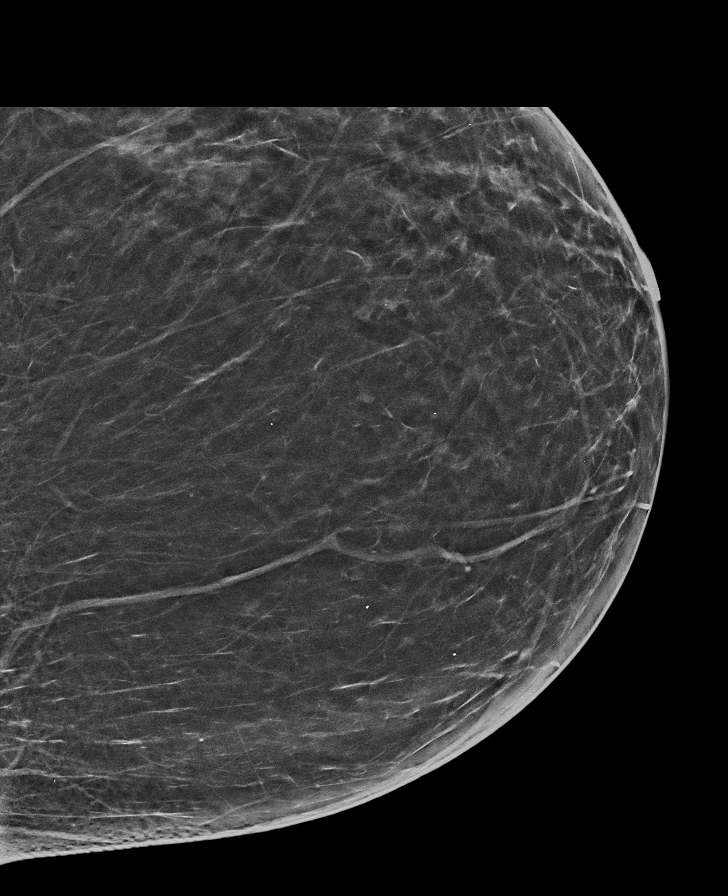

[R CC synth-2D (1 of 2)]
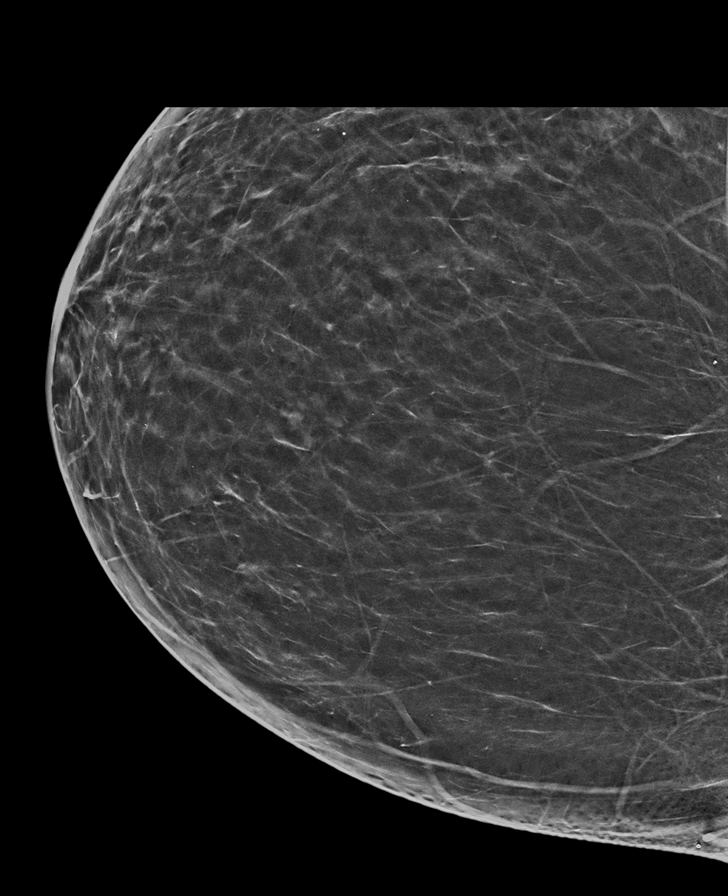

[R CC synth-2D (2 of 2)]
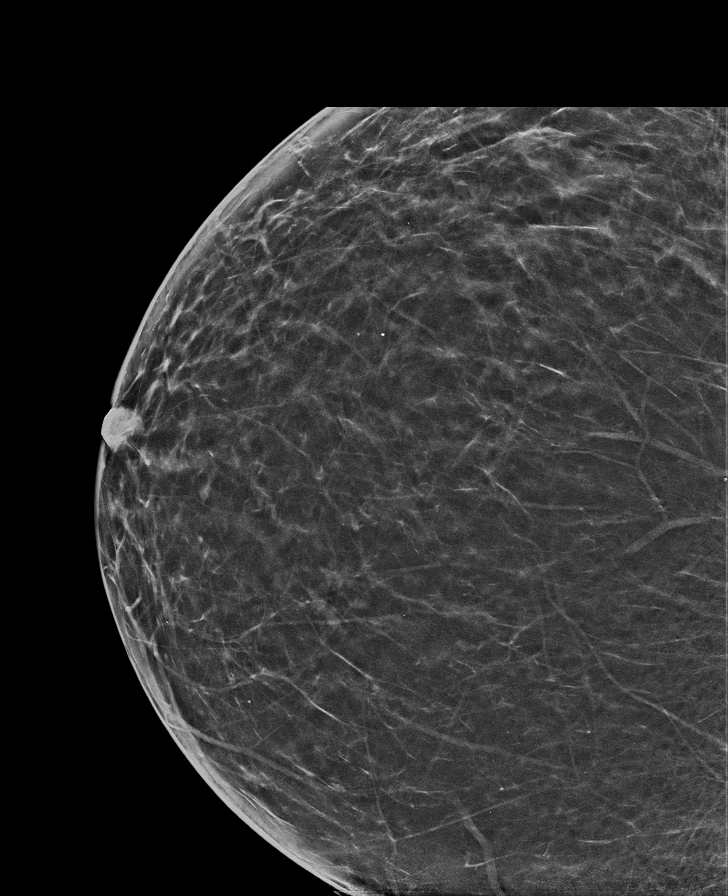

[R MLO synth-2D (1 of 3)]
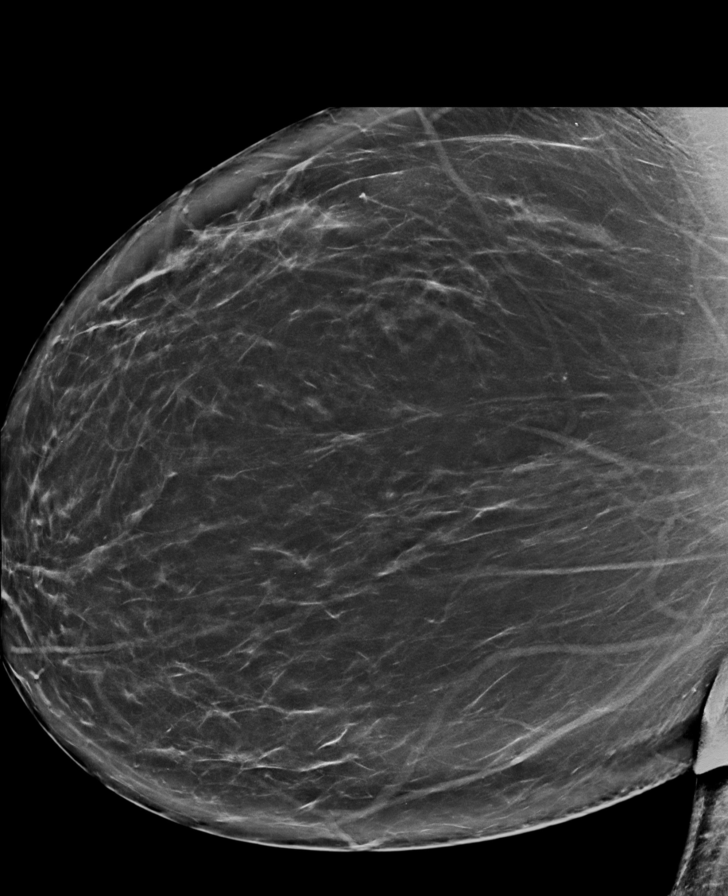

[L MLO synth-2D]
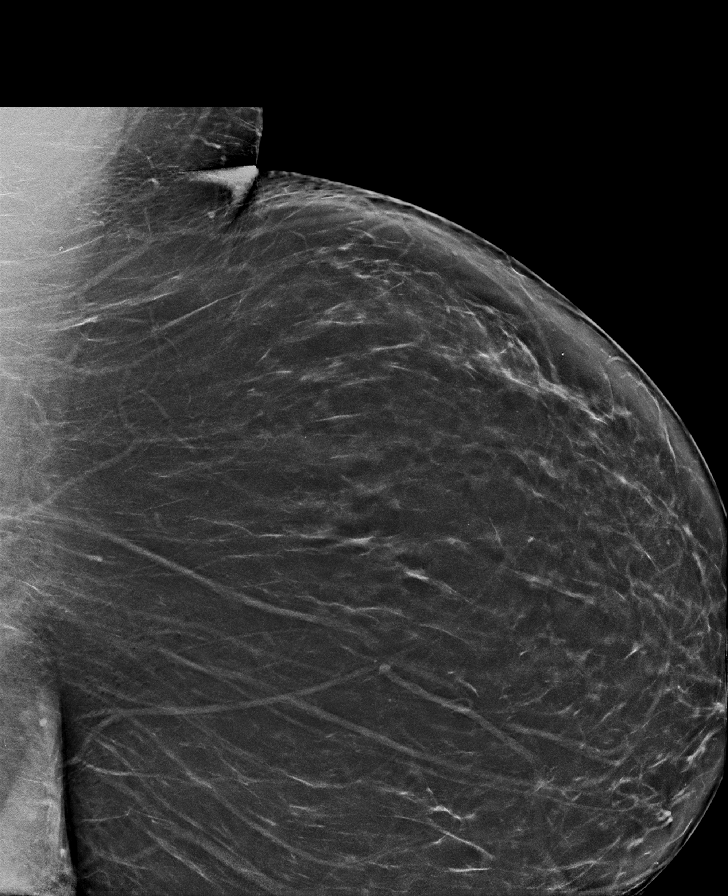

[R MLO synth-2D (2 of 3)]
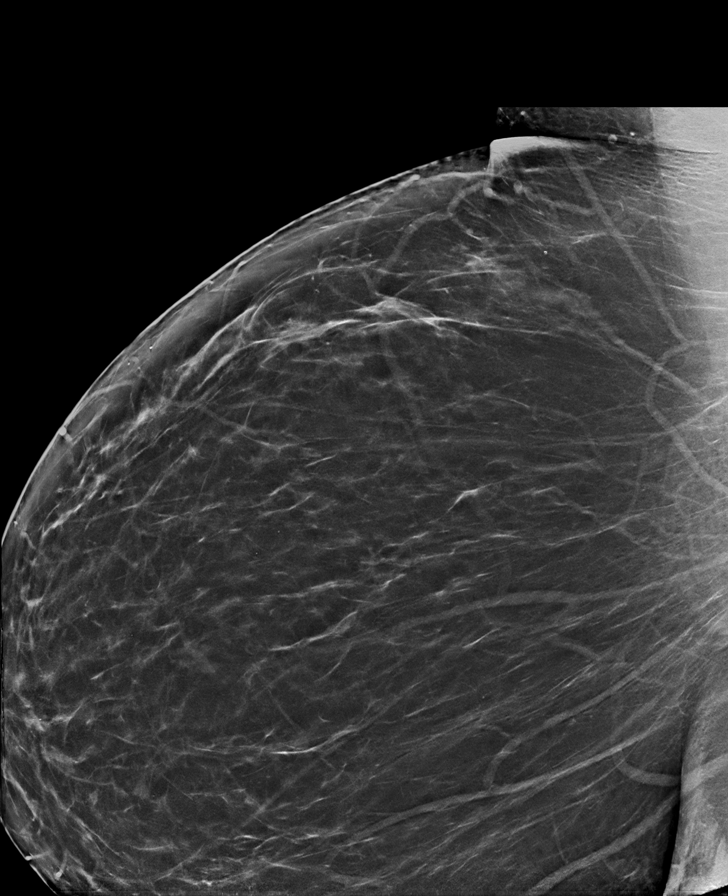

[R MLO synth-2D (3 of 3)]
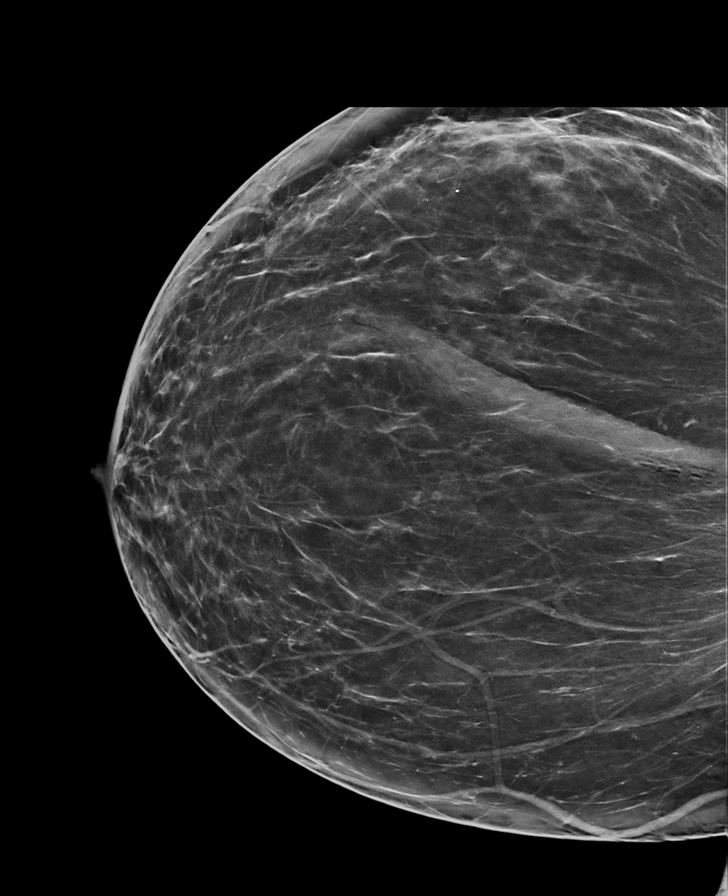

[R CC tomo · tomo slice 39/77.0]
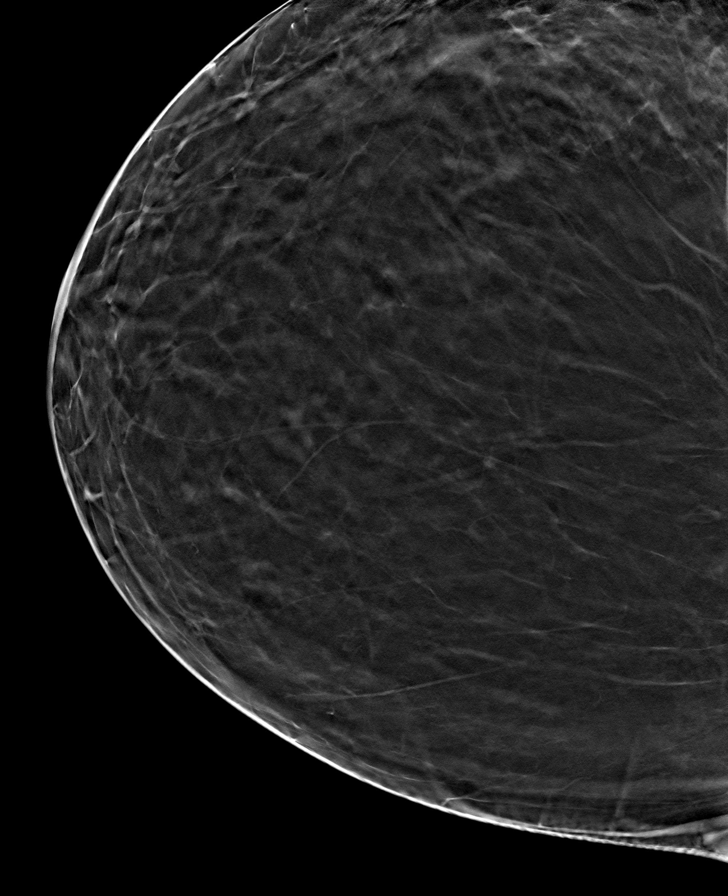

[8 of 40 positions shown; findings below may reference images not displayed]

ACR Breast Density Category b: There are scattered areas of
fibroglandular density.
FINDINGS: There are no findings suspicious for malignancy. Images were
processed with CAD.
IMPRESSION: No mammographic evidence of malignancy. A result letter of this
screening mammogram will be mailed directly to the patient.

RECOMMENDATION:
Screening mammogram in one year. (Code:[id number])

BI-RADS CATEGORY  1: Negative.

## 2020-04-15 ENCOUNTER — Ambulatory Visit
Admission: RE | Admit: 2020-04-15 | Discharge: 2020-04-15 | Disposition: A | Discharge status: Home / Self Care | Payer: BC Managed Care – PPO | Source: Ambulatory Visit | Attending: Internal Medicine | Admitting: Internal Medicine

## 2020-04-15 ENCOUNTER — Other Ambulatory Visit: Payer: Self-pay

## 2020-04-15 VITALS — BP 147/87 | HR 86 | Temp 98.5°F | Resp 18 | Ht 66.0 in | Wt 295.0 lb

## 2020-04-15 DIAGNOSIS — Z888 Allergy status to other drugs, medicaments and biological substances status: Secondary | ICD-10-CM | POA: Diagnosis not present

## 2020-04-15 DIAGNOSIS — E785 Hyperlipidemia, unspecified: Secondary | ICD-10-CM | POA: Insufficient documentation

## 2020-04-15 DIAGNOSIS — Z9049 Acquired absence of other specified parts of digestive tract: Secondary | ICD-10-CM | POA: Insufficient documentation

## 2020-04-15 DIAGNOSIS — M792 Neuralgia and neuritis, unspecified: Secondary | ICD-10-CM | POA: Diagnosis not present

## 2020-04-15 DIAGNOSIS — I10 Essential (primary) hypertension: Secondary | ICD-10-CM | POA: Insufficient documentation

## 2020-04-15 DIAGNOSIS — E1165 Type 2 diabetes mellitus with hyperglycemia: Secondary | ICD-10-CM | POA: Diagnosis not present

## 2020-04-15 DIAGNOSIS — M7711 Lateral epicondylitis, right elbow: Secondary | ICD-10-CM | POA: Diagnosis not present

## 2020-04-15 DIAGNOSIS — F319 Bipolar disorder, unspecified: Secondary | ICD-10-CM | POA: Diagnosis not present

## 2020-04-15 DIAGNOSIS — D329 Benign neoplasm of meninges, unspecified: Secondary | ICD-10-CM | POA: Diagnosis not present

## 2020-04-15 DIAGNOSIS — Z20822 Contact with and (suspected) exposure to covid-19: Secondary | ICD-10-CM | POA: Insufficient documentation

## 2020-04-15 DIAGNOSIS — R7989 Other specified abnormal findings of blood chemistry: Secondary | ICD-10-CM | POA: Insufficient documentation

## 2020-04-15 DIAGNOSIS — E1159 Type 2 diabetes mellitus with other circulatory complications: Secondary | ICD-10-CM | POA: Insufficient documentation

## 2020-04-15 DIAGNOSIS — K219 Gastro-esophageal reflux disease without esophagitis: Secondary | ICD-10-CM | POA: Diagnosis not present

## 2020-04-15 DIAGNOSIS — Z7984 Long term (current) use of oral hypoglycemic drugs: Secondary | ICD-10-CM | POA: Insufficient documentation

## 2020-04-15 DIAGNOSIS — R52 Pain, unspecified: Secondary | ICD-10-CM | POA: Diagnosis not present

## 2020-04-15 DIAGNOSIS — Z79899 Other long term (current) drug therapy: Secondary | ICD-10-CM | POA: Diagnosis not present

## 2020-04-15 DIAGNOSIS — Z87891 Personal history of nicotine dependence: Secondary | ICD-10-CM | POA: Insufficient documentation

## 2020-04-15 DIAGNOSIS — Z6841 Body Mass Index (BMI) 40.0 and over, adult: Secondary | ICD-10-CM | POA: Diagnosis not present

## 2020-04-15 DIAGNOSIS — E039 Hypothyroidism, unspecified: Secondary | ICD-10-CM | POA: Diagnosis not present

## 2020-04-15 DIAGNOSIS — Z7951 Long term (current) use of inhaled steroids: Secondary | ICD-10-CM | POA: Insufficient documentation

## 2020-04-15 DIAGNOSIS — R197 Diarrhea, unspecified: Secondary | ICD-10-CM | POA: Diagnosis not present

## 2020-04-15 MED ORDER — LIDOCAINE 5 % EX PTCH: 1.0000 | MEDICATED_PATCH | CUTANEOUS | 0 refills | Status: DC

## 2020-04-15 NOTE — Discharge Instructions (Signed)
Elbow brace will be helpful Lidoderm patch to be applied to the right elbow.

## 2020-04-15 NOTE — ED Triage Notes (Signed)
Pt with generalized body aches and diarrhea x past two weeks. States she just feels really bad all over.

## 2020-04-16 LAB — SARS CORONAVIRUS 2 (TAT 6-24 HRS): SARS Coronavirus 2: NEGATIVE

## 2020-04-16 NOTE — ED Provider Notes (Signed)
[facility]    CSN: [id number] Arrival date & time: 04/15/20  1501      History   Chief Complaint Chief Complaint  Patient presents with  . Appointment  . Diarrhea  . Generalized Body Aches    HPI Lori Knight is a 49 y.o. female comes to the urgent care with complaints of right elbow pain which has been ongoing for several weeks.  Patient has been seen by her primary care physician and recommended wrist brace.  Patient describes pain as sharp and shooting down the fifth finger of the right hand.  No trauma to the elbow.  No swelling.  No erythema.  Patient has no history of arthritic disease.  He has not tried any over-the-counter medication  She also complains of generalized body aches, diarrhea for the past 2 weeks.  No COVID-19 exposures.  Patient is not immunized against COVID-19 virus.Marland Kitchen   HPI  Past Medical History:  Diagnosis Date  . Adult hypothyroidism 06/21/2012  . Bilateral polycystic ovarian syndrome 06/21/2012  . Biliary calculus with cholecystitis 08/14/2012  . Bipolar 1 disorder ([address])   . BP (high blood pressure) 02/14/2014  . Bronchitis   . Diabetes mellitus ([place]) 08/14/2012  . Diabetes mellitus without complication ([place])    pre-diabetic  . Dysrhythmia    wore heart monitor 2016. Corrected by changing Levothyroxine dose.  Marland Kitchen Heart murmur    followed by PCP-AS A CHILD-ASYMPTOMATIC  . Hyperprolactinemia ([place]) 06/21/2012  . Hypertension   . Hypoxia 04/17/2018  . Motion sickness    carnival rides  . PONV (postoperative nausea and vomiting)    WAKES UP CRYING  . Shortness of breath dyspnea    stairs. related to wt.  . Sleep apnea    has CPAP. has not used since 2011  . Vertigo    2x in last yr    Patient Active Problem List   Diagnosis Date Noted  . Type 2 diabetes mellitus with proteinuria ([place]) 02/20/2020  . Right elbow pain 02/20/2020  . Meningioma ([place]) 12/24/2019  . Hyperlipidemia associated with type 2 diabetes mellitus ([place])  08/13/2019  . Gastroesophageal reflux disease without esophagitis 06/20/2019  . Elevated liver function tests 12/26/2018  . Elevated ferritin 12/20/2018  . Cough in adult patient 12/26/2017  . Spasm of muscle of lower back 05/11/2017  . Fibroma of foot 03/03/2016  . Neuropathic pain of finger 03/03/2016  . Type 2 diabetes mellitus with morbid obesity ([place]) 12/01/2015  . OSA on CPAP 09/22/2015  . Insomnia 08/28/2015  . Internal hemorrhoids 08/28/2015  . Vitamin D deficiency 08/28/2015  . Obesity, Class III, BMI 40-49.9 (morbid obesity) ([place]) 08/27/2015  . Hypertension associated with diabetes ([place]) 02/14/2014  . Benign paroxysmal positional nystagmus 02/14/2014  . 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 Bipolar I disorder, single manic episode ([place]) 08/14/2012  . Adult hypothyroidism 06/21/2012  . Bilateral polycystic ovarian syndrome 06/21/2012    Past Surgical History:  Procedure Laterality Date  . ABDOMINAL HYSTERECTOMY  2012   cervical dysplasia/ovaries remian  . CHOLECYSTECTOMY    . COLONOSCOPY WITH PROPOFOL N/A 06/19/2015   Procedure: COLONOSCOPY WITH PROPOFOL;  Surgeon: [name], MD;  Location: [facility];  Service: Endoscopy;  Laterality: N/A;  Diabetic - oral meds   . DENTAL SURGERY    . ETHMOIDECTOMY Bilateral 04/17/2018   Procedure: ETHMOIDECTOMY;  Surgeon: [name], MD;  Location: [facility];  Service: ENT;  Laterality: Bilateral;  . GALLBLADDER SURGERY    . IMAGE  GUIDED SINUS SURGERY N/A 04/17/2018   Procedure: IMAGE GUIDED SINUS SURGERY;  Surgeon: [name], MD;  Location: [facility];  Service: ENT;  Laterality: N/A;  . MAXILLARY ANTROSTOMY Bilateral 04/17/2018   Procedure: MAXILLARY ANTROSTOMY;  Surgeon: [name], MD;  Location: [facility];  Service: ENT;  Laterality: Bilateral;  . NASAL SEPTOPLASTY W/ TURBINOPLASTY Bilateral 04/17/2018   Procedure: NASAL SEPTOPLASTY WITH TURBINATE REDUCTION;  Surgeon: [name], MD;  Location: [facility];   Service: ENT;  Laterality: Bilateral;  . TRANSURETHRAL RESECTION OF BLADDER TUMOR N/A 04/17/2018   Procedure: NTRANSURETHRAL RESECTION OF BLADDER TUMOR (TURBT) WITH GEMCITABINE;  Surgeon: [name], MD;  Location: [facility];  Service: Urology;  Laterality: N/A;    OB History   No obstetric history on file.      Home Medications    Prior to Admission medications   Medication Sig Start Date End Date Taking? Authorizing Provider  amLODipine (NORVASC) 5 MG tablet Take 1 tablet (5 mg total) by mouth daily. 06/20/19   [name], MD  Ascorbic Acid (VITAMIN C) 1000 MG tablet Take 1,000 mg by mouth daily.    [provider]  benzonatate (TESSALON) 200 MG capsule Take 1 capsule (200 mg total) by mouth 3 (three) times daily as needed for cough. 08/27/19   Norval Gable, MD  blood glucose meter kit and supplies by XX route as directed 09/04/19 09/03/20  [provider]  Cholecalciferol 5000 units capsule Take 1 capsule (5,000 Units total) by mouth daily. 11/28/15   Adline Potter, MD  CONTOUR NEXT TEST test strip USE TO CHECK BS UP TO 3 TIMES DAILY FOR DIABETES DX: E11.9 06/13/19   [name], MD  Dulaglutide 1.5 MG/0.5ML SOPN Inject 1.5 mg into the skin once a week. 08/28/18   [provider]  Fluticasone-Umeclidin-Vilant (TRELEGY ELLIPTA) 100-62.5-25 MCG/INH AEPB Inhale 1 puff into the lungs daily. 09/13/19   [provider]  glipiZIDE (GLUCOTROL) 5 MG tablet Take 5 mg by mouth every morning. 06/04/19   [provider]  lamoTRIgine (LAMICTAL) 100 MG tablet Take 300 mg by mouth daily. 12/22/19   [provider]  levothyroxine (SYNTHROID, LEVOTHROID) 175 MCG tablet TAKE 1 TABLET (175 MCG TOTAL) BY MOUTH DAILY. Patient taking differently: Take 175 mcg by mouth daily. 2 tablets on Sun 09/05/17   [name], MD  lidocaine (LIDODERM) 5 % Place 1 patch onto the skin daily. Remove & Discard patch within 12 hours or as directed by MD  04/15/20   [place] Counterman, Myrene Galas, MD  losartan (COZAAR) 100 MG tablet TAKE 1/2 TABLET (50 MG TOTAL) BY MOUTH 2 (TWO) TIMES DAILY. 04/23/19   [name], MD  metFORMIN (GLUCOPHAGE) 500 MG tablet Take 1 tablet (500 mg total) by mouth 3 (three) times daily. Patient taking differently: Take 1,000 mg by mouth 2 (two) times daily with a meal.  07/10/18   [name], MD  mometasone (ELOCON) 0.1 % cream Apply 1 application topically daily as needed.  11/30/19   [provider]  Multiple Vitamin (MULTIVITAMIN) tablet Take 1 tablet by mouth daily.    [provider]  Omega-3 Fatty Acids (FISH OIL) 1200 MG CAPS Take 1,200 mg by mouth daily.     [provider]  omeprazole (PRILOSEC) 40 MG capsule Take 1 capsule (40 mg total) by mouth daily. 01/28/20   Cannady, Henrine Screws T, NP  ondansetron (ZOFRAN ODT) 4 MG disintegrating tablet Take 1 tablet (4 mg total) by mouth every  8 (eight) hours as needed for nausea or vomiting. 07/09/19   Locke Barrell, Myrene Galas, MD  albuterol (VENTOLIN HFA) 108 (90 Base) MCG/ACT inhaler Inhale 2 puffs into the lungs every 4 (four) hours as needed for wheezing or shortness of breath. 11/23/18 08/04/19  [name], MD    Family History Family History  Problem Relation Age of Onset  . Diabetes Maternal Grandmother   . Hypertension Maternal Grandmother   . Hyperlipidemia Maternal Grandmother   . Hypothyroidism Maternal Grandmother   . Heart disease Maternal Grandmother   . Diabetes Mother   . Hypertension Mother   . Hypothyroidism Mother   . Bipolar disorder Mother   . Heart disease Mother   . Alcohol abuse Father   . Depression Daughter   . Breast cancer Neg Hx     Social History Social History   Tobacco Use  . Smoking status: Former Smoker    Packs/day: 1.00    Years: 10.00    Pack years: 10.00    Types: Cigarettes    Quit date: 07/12/2002    Years since quitting: 17.7  . Smokeless tobacco: Never Used  . Tobacco comment: quit 2004    Vaping Use  . Vaping Use: Never used  Substance Use Topics  . Alcohol use: Yes    Alcohol/week: 0.0 standard drinks    Comment: rarely  . Drug use: No     Allergies   Hctz [hydrochlorothiazide], Lisinopril, Latex, and Prednisone   Review of Systems Review of Systems  Constitutional: Negative.   HENT: Negative.   Respiratory: Positive for shortness of breath.   Cardiovascular: Negative.   Gastrointestinal: Negative.   Musculoskeletal: Positive for myalgias. Negative for arthralgias, back pain and joint swelling.  Neurological: Negative for headaches.     Physical Exam Triage Vital Signs ED Triage Vitals  Enc Vitals Group     BP 04/15/20 1544 (!) 147/87     Pulse Rate 04/15/20 1544 86     Resp 04/15/20 1544 18     Temp 04/15/20 1544 98.5 F (36.9 C)     Temp Source 04/15/20 1544 Oral     SpO2 04/15/20 1544 97 %     Weight 04/15/20 1543 295 lb (133.8 kg)     Height 04/15/20 1543 _0  (1.676 m)     Head Circumference --      Peak Flow --      Pain Score 04/15/20 1542 6     Pain Loc --      Pain Edu? --      Excl. in [place]? --    No data found.  Updated Vital Signs BP (!) 147/87 (BP Location: Left Arm)   Pulse 86   Temp 98.5 F (36.9 C) (Oral)   Resp 18   Ht _1  (1.676 m)   Wt 133.8 kg   SpO2 97%   BMI 47.61 kg/m   Visual Acuity Right Eye Distance:   Left Eye Distance:   Bilateral Distance:    Right Eye Near:   Left Eye Near:    Bilateral Near:     Physical Exam Vitals and nursing note reviewed.  Constitutional:      Appearance: Normal appearance. She is not toxic-appearing or diaphoretic.  Cardiovascular:     Rate and Rhythm: Normal rate and regular rhythm.  Musculoskeletal:     Comments: Slightly decreased range of motion around the right elbow.  No swelling.  Tenderness on palpation over the lateral epicondyle.  Neurological:  Mental Status: She is alert.      UC Treatments / Results  Labs (all labs ordered are listed, but only  abnormal results are displayed) Labs Reviewed  SARS CORONAVIRUS 2 (TAT 6-24 HRS)    EKG   Radiology No results found.  Procedures Procedures (including critical care time)  Medications Ordered in UC Medications - No data to display  Initial Impression / Assessment and Plan / UC Course  I have reviewed the triage vital signs and the nursing notes.  Pertinent labs & imaging results that were available during my care of the patient were reviewed by me and considered in my medical decision making (see chart for details).     1.  Lateral epicondylitis with neuropathic pain in the right forearm: I offered gabapentin but patient refused I referred her to orthopedic surgery in [place] clinic to be evaluated Lidoderm patch Range of motion exercises Return precautions given. Final Clinical Impressions(s) / UC Diagnoses   Final diagnoses:  Neuropathic pain of right forearm     Discharge Instructions     Elbow brace will be helpful Lidoderm patch to be applied to the right elbow.   ED Prescriptions    Medication Sig Dispense Auth. Provider   lidocaine (LIDODERM) 5 % Place 1 patch onto the skin daily. Remove & Discard patch within 12 hours or as directed by MD 30 patch Alyviah Crandle, Myrene Galas, MD     PDMP not reviewed this encounter.   Chase Picket, MD 04/16/20 1452

## 2020-04-17 NOTE — Telephone Encounter (Signed)
I contacted the patient and informed her that I have submitted a request to [employer] to remove Z76.89 as the primary diagnosis code for DOS 01/02/20. Also advised that the claim would be resubmitted to [facility] for payment. Patient was happy and thanked me for my call.  Jolene FYI: Most insurance companies deny claims if a Z diagnosis code is used as the primary diagnosis unless it is an annual CPE.    Thanks

## 2020-04-22 NOTE — Progress Notes (Deleted)
04/23/2020 9:21 AM   Lori Knight Dec 02, 1970 [id number]  Referring provider: [name], [place],  [place] [address]  No chief complaint on file.   HPI: Patient is a 49 -year-old female with a history of hematuria, bilateral angiomyolipomas, mixed incontinence and nocturia who presents today for follow up.  Intermediate risk hematuria Former smoker.  CTU 03/2018 revealed normal adrenal glands.  No kidney stones identified. No hydronephrosis, hydroureter or ureterolithiasis. Anterior right lower pole angiomyolipoma measures 3.7 cm, image 49/2. Tiny angiomyolipoma within the left kidney measures 6 mm. No suspicious kidney mass. Urinary bladder appears normal.  Cystoscopy in 04/2018 with Dr. Erlene Quan noted a small 1 cm round papillary tumor at left dome of bladder highly concerning for bladder cancer.  She underwent a TURBT in 04/2018.  Lesion found not to be malignant.   ***  Bilateral angiomyolipomas Found on 03/2018 CTU: 3.7 cm right lower pole angiomyolipoma and 6 mm left kidney angiomyolipoma.  RUS in 09/2018 revealed unchanged bilateral renal angiomyolipomas.  She is having bilateral flank pain.  It is mild.  Repeat RUS pending.  Mixed incontinence The patient is  experiencing urgency x 8 or more, frequency x 8 or more, not restricting fluids to avoid visits to the restroom, is engaging in toilet mapping, incontinence x 8 or more and nocturia x 4-7.   Her BP is 171/108.   Her PVR is 0 mL.  She is having frequency, dysuria (intermittent), nocturia and incontinence (wears depends - goes through two a day)  Nocturia Sleeping with CPAP machine      PMH: Past Medical History:  Diagnosis Date  . Adult hypothyroidism 06/21/2012  . Bilateral polycystic ovarian syndrome 06/21/2012  . Biliary calculus with cholecystitis 08/14/2012  . Bipolar 1 disorder ([place])   . BP (high blood pressure) 02/14/2014  . Bronchitis   . Diabetes mellitus ([place]) 08/14/2012    . Diabetes mellitus without complication ([place])    pre-diabetic  . Dysrhythmia    wore heart monitor 2016. Corrected by changing Levothyroxine dose.  Marland Kitchen Heart murmur    followed by PCP-AS A CHILD-ASYMPTOMATIC  . Hyperprolactinemia ([place]) 06/21/2012  . Hypertension   . Hypoxia 04/17/2018  . Motion sickness    carnival rides  . PONV (postoperative nausea and vomiting)    WAKES UP CRYING  . Shortness of breath dyspnea    stairs. related to wt.  . Sleep apnea    has CPAP. has not used since 2011  . Vertigo    2x in last yr    Surgical History: Past Surgical History:  Procedure Laterality Date  . ABDOMINAL HYSTERECTOMY  2012   cervical dysplasia/ovaries remian  . CHOLECYSTECTOMY    . COLONOSCOPY WITH PROPOFOL N/A 06/19/2015   Procedure: COLONOSCOPY WITH PROPOFOL;  Surgeon: [name], MD;  Location: [facility];  Service: Endoscopy;  Laterality: N/A;  Diabetic - oral meds   . DENTAL SURGERY    . ETHMOIDECTOMY Bilateral 04/17/2018   Procedure: ETHMOIDECTOMY;  Surgeon: [name], MD;  Location: [facility];  Service: ENT;  Laterality: Bilateral;  . GALLBLADDER SURGERY    . IMAGE GUIDED SINUS SURGERY N/A 04/17/2018   Procedure: IMAGE GUIDED SINUS SURGERY;  Surgeon: [name], MD;  Location: [facility];  Service: ENT;  Laterality: N/A;  . MAXILLARY ANTROSTOMY Bilateral 04/17/2018   Procedure: MAXILLARY ANTROSTOMY;  Surgeon: [name], MD;  Location: [facility];  Service: ENT;  Laterality: Bilateral;  . NASAL SEPTOPLASTY W/  TURBINOPLASTY Bilateral 04/17/2018   Procedure: NASAL SEPTOPLASTY WITH TURBINATE REDUCTION;  Surgeon: [name], MD;  Location: [facility];  Service: ENT;  Laterality: Bilateral;  . TRANSURETHRAL RESECTION OF BLADDER TUMOR N/A 04/17/2018   Procedure: NTRANSURETHRAL RESECTION OF BLADDER TUMOR (TURBT) WITH GEMCITABINE;  Surgeon: [name], MD;  Location: [facility];  Service: Urology;  Laterality: N/A;    Home Medications:  Allergies as of 04/23/2020       Reactions   Hctz [hydrochlorothiazide] Cough   Lisinopril Cough   Latex Rash   Condoms only   Prednisone Anxiety   Paranoia      Medication List       Accurate as of April 22, 2020  9:21 AM. If you have any questions, ask your nurse or doctor.        amLODipine 5 MG tablet Commonly known as: NORVASC Take 1 tablet (5 mg total) by mouth daily.   benzonatate 200 MG capsule Commonly known as: TESSALON Take 1 capsule (200 mg total) by mouth 3 (three) times daily as needed for cough.   blood glucose meter kit and supplies by XX route as directed   Cholecalciferol 125 MCG (5000 UT) capsule Take 1 capsule (5,000 Units total) by mouth daily.   Contour Next Test test strip Generic drug: glucose blood USE TO CHECK BS UP TO 3 TIMES DAILY FOR DIABETES DX: E11.9   Dulaglutide 1.5 MG/0.5ML Sopn Inject 1.5 mg into the skin once a week.   Fish Oil 1200 MG Caps Take 1,200 mg by mouth daily.   glipiZIDE 5 MG tablet Commonly known as: GLUCOTROL Take 5 mg by mouth every morning.   lamoTRIgine 100 MG tablet Commonly known as: LAMICTAL Take 300 mg by mouth daily.   levothyroxine 175 MCG tablet Commonly known as: SYNTHROID TAKE 1 TABLET (175 MCG TOTAL) BY MOUTH DAILY. What changed: additional instructions   lidocaine 5 % Commonly known as: Lidoderm Place 1 patch onto the skin daily. Remove & Discard patch within 12 hours or as directed by MD   losartan 100 MG tablet Commonly known as: COZAAR TAKE 1/2 TABLET (50 MG TOTAL) BY MOUTH 2 (TWO) TIMES DAILY.   metFORMIN 500 MG tablet Commonly known as: GLUCOPHAGE Take 1 tablet (500 mg total) by mouth 3 (three) times daily. What changed:   how much to take  when to take this   mometasone 0.1 % cream Commonly known as: ELOCON Apply 1 application topically daily as needed.   multivitamin tablet Take 1 tablet by mouth daily.   omeprazole 40 MG capsule Commonly known as: PRILOSEC Take 1 capsule (40 mg total) by mouth  daily.   ondansetron 4 MG disintegrating tablet Commonly known as: Zofran ODT Take 1 tablet (4 mg total) by mouth every 8 (eight) hours as needed for nausea or vomiting.   Trelegy Ellipta 100-62.5-25 MCG/INH Aepb Generic drug: Fluticasone-Umeclidin-Vilant Inhale 1 puff into the lungs daily.   vitamin C 1000 MG tablet Take 1,000 mg by mouth daily.       Allergies:  Allergies  Allergen Reactions  . Hctz [Hydrochlorothiazide] Cough  . Lisinopril Cough  . Latex Rash    Condoms only  . Prednisone Anxiety    Paranoia    Family History: Family History  Problem Relation Age of Onset  . Diabetes Maternal Grandmother   . Hypertension Maternal Grandmother   . Hyperlipidemia Maternal Grandmother   . Hypothyroidism Maternal Grandmother   . Heart disease Maternal Grandmother   . Diabetes Mother   .  Hypertension Mother   . Hypothyroidism Mother   . Bipolar disorder Mother   . Heart disease Mother   . Alcohol abuse Father   . Depression Daughter   . Breast cancer Neg Hx     Social History:  reports that she quit smoking about 17 years ago. Her smoking use included cigarettes. She has a 10.00 pack-year smoking history. She has never used smokeless tobacco. She reports current alcohol use. She reports that she does not use drugs.  ROS: For pertinent review of systems please refer to history of present illness  Physical Exam: There were no vitals taken for this visit.  Constitutional:  Well nourished. Alert and oriented, No acute distress. HEENT: [place] AT, moist mucus membranes.  Trachea midline, no masses. Cardiovascular: No clubbing, cyanosis, or edema. Respiratory: Normal respiratory effort, no increased work of breathing. GI: Abdomen is soft, non tender, non distended, no abdominal masses. Liver and spleen not palpable.  No hernias appreciated.  Stool sample for occult testing is not indicated.   GU: No CVA tenderness.  No bladder fullness or masses.  *** external genitalia,  *** pubic hair distribution, no lesions.  Normal urethral meatus, no lesions, no prolapse, no discharge.   No urethral masses, tenderness and/or tenderness. No bladder fullness, tenderness or masses. *** vagina mucosa, *** estrogen effect, no discharge, no lesions, *** pelvic support, *** cystocele and *** rectocele noted.  No cervical motion tenderness.  Uterus is freely mobile and non-fixed.  No adnexal/parametria masses or tenderness noted.  Anus and perineum are without rashes or lesions.   ***  Skin: No rashes, bruises or suspicious lesions. Lymph: No cervical or inguinal adenopathy. Neurologic: Grossly intact, no focal deficits, moving all 4 extremities. Psychiatric: Normal mood and affect.   Laboratory Data: Lab Results  Component Value Date   WBC 10.6 06/20/2019   HGB 13.6 06/20/2019   HCT 41.8 06/20/2019   MCV 89 06/20/2019   PLT 309 06/20/2019    Lab Results  Component Value Date   CREATININE 0.91 02/20/2020    Lab Results  Component Value Date   HGBA1C 6.7% 03/19/2020    Lab Results  Component Value Date   TSH 5.30 05/31/2019   TSH 5.38 05/31/2019       Component Value Date/Time   CHOL 213 (H) 02/20/2020 1552   HDL 36 (L) 02/20/2020 1552   LDLCALC 119 (H) 02/20/2020 1552    Lab Results  Component Value Date   AST 56 (A) 05/31/2019   Lab Results  Component Value Date   ALT 98 (A) 05/31/2019    Urinalysis *** I have reviewed the labs.   Assessment & Plan:    1. History of hematuria Hematuria work up completed in 04/2018 - findings positive for NODULAR AREA OF CYSTITIS CYSTICA.  NEGATIVE FOR ATYPIA AND MALIGNANCY.   DEEPER SECTIONS EXAMINED.  BLADDER, TRIGONE; BIOPSY:  SQUAMOUS METAPLASIA, NON-KERATINIZING.  CYSTITIS CYSTICA WITH FOCAL CALCIFICATION. NEGATIVE FOR ATYPIA AND MALIGNANCY No report of gross hematuria  UA today negative for microscopic hematuria  RTC in one year for UA - patient to report any gross hematuria in the interim    2.  Bilateral angiomyolipomas Will order a RUS   3. Mixed incontinence Discussion behavioral therapies, bladder training and bladder control strategies Pelvic floor muscle training - patient is not wanting a referral for PT as she does not feel comfortable placing things in her vaginal canal  Fluid management - avoid Cokes  She is not a good  candidate for OAB medications as she has uncontrolled HTN and issues with memory  explained the PTNS provides treatment by indirectly providing electrical stimulation to the nerves responsible for bladder and pelvic floor function - a needle electrode generates an adjustable electrical pulse that travels to the sacral plexus via the tibial nerve which is located in the ankle, among other functions, the sacral nerve plexus regulates bladder and pelvic floor function - treatment protocol requires once-a-week treatments for 12 weeks, 30 minutes per session and many patients begin to see improvements by the 6th treatment. Patients who respond to treatment may require occasional treatments (~ once every 3 weeks) to sustain improvements. PTNS is a low-risk procedure. The most common side-effects with PTNS treatment are temporary and minor, resulting from the placement of the needle electrode. They include minor bleeding, mild pain and skin inflammation and patients have seen up to an 80% success rate with this form of treatment RTC for PTNS    4. Nocturia Sleeping with CPAP   No follow-ups on file.  These notes generated with voice recognition software. I apologize for typographical errors.  Zara Council, PA-C  [facility] [address]  [place], [place] [address] [phone]

## 2020-04-23 ENCOUNTER — Ambulatory Visit: Payer: BC Managed Care – PPO | Admitting: Urology

## 2020-04-23 ENCOUNTER — Other Ambulatory Visit: Payer: Self-pay | Admitting: Nurse Practitioner

## 2020-04-23 DIAGNOSIS — R351 Nocturia: Secondary | ICD-10-CM

## 2020-04-23 DIAGNOSIS — R319 Hematuria, unspecified: Secondary | ICD-10-CM

## 2020-04-23 DIAGNOSIS — K219 Gastro-esophageal reflux disease without esophagitis: Secondary | ICD-10-CM

## 2020-04-23 DIAGNOSIS — D1771 Benign lipomatous neoplasm of kidney: Secondary | ICD-10-CM

## 2020-04-23 DIAGNOSIS — N3946 Mixed incontinence: Secondary | ICD-10-CM

## 2020-04-23 MED ORDER — OMEPRAZOLE 40 MG PO CPDR: 40.0000 mg | DELAYED_RELEASE_CAPSULE | Freq: Every day | ORAL | 4 refills | Status: DC

## 2020-04-27 NOTE — Progress Notes (Signed)
04/28/2020 9:39 AM   Lori Knight 08/25/1970 [id number]  Referring provider: [name], MD [address] [place],  [place] [address] Chief Complaint  Patient presents with  . Follow-up    1year    HPI: Lori Knight is a 49 y.o. female who returns for a 1 year follow up of nocturia, mixed incontinence, bilateral angiomyolipomas, and history of hematuria.   Intermediate risk hematuria Former smoker.  CTU 03/2018 revealed normal adrenal glands.  No kidney stones identified. No hydronephrosis, hydroureter or ureterolithiasis. Anterior right lower pole angiomyolipoma measures 3.7 cm, image 49/2. Tiny angiomyolipoma within the left kidney measures 6 mm. No suspicious kidney mass. Urinary bladder appears normal.  Cystoscopy in 04/2018 with Dr. Erlene Quan noted a small 1 cm round papillary tumor at left dome of bladder highly concerning for bladder cancer.  She underwent a TURBT in 04/2018.  Lesion found not to be malignant.  She does not report any gross hematuria.  UA today was negative for microscopic hematuria.     Bilateral angiomyolipomas Found on 03/2018 CTU: 3.7 cm right lower pole angiomyolipoma and 6 mm left kidney angiomyolipoma.  RUS in 09/2018 revealed unchanged bilateral renal angiomyolipomas.  She was having bilateral flank pain.  Pain was mild. RUS from 04/19/2019 noted a 4.0 cm hyperechoic mass mid inferior RIGHT kidney consistent with angiomyolipoma. A 7 mm hyperechoic nodule LEFT kidney consistent with angiomyolipoma. She has occasional flank pain. Repeat RUS is pending.  Mixed incontinence Patient has completed 12 sessions of PTNS.   The patient is  experiencing urgency x 0-3 (improved), frequency x 4-7 (improved), is restricting fluids to avoid visits to the restroom, is engaging in toilet mapping, incontinence x 0-3 (improved) and nocturia x 0-3 (improved).   Her BP is 150/93.   Her PVR is 22 mL.  Previous PVR was 0 mL. She has limited her sweet tea  and caffeine consumption. She reports improvement in her in day time and night time symptoms.   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 She loves her new job.   Nocturia Sleeping with CPAP machine more often now.     PMH: Past Medical History:  Diagnosis Date  . Adult hypothyroidism 06/21/2012  . Bilateral polycystic ovarian syndrome 06/21/2012  . Biliary calculus with cholecystitis 08/14/2012  . Bipolar 1 disorder ([place])   . BP (high blood pressure) 02/14/2014  . Bronchitis   . Diabetes mellitus ([place]) 08/14/2012  . Diabetes mellitus without complication ([place])    pre-diabetic  . Dysrhythmia    wore heart monitor 2016. Corrected by changing Levothyroxine dose.  Marland Kitchen Heart murmur    followed by PCP-AS A CHILD-ASYMPTOMATIC  . Hyperprolactinemia ([place]) 06/21/2012  . Hypertension   . Hypoxia 04/17/2018  . Motion sickness    carnival rides  . PONV (postoperative nausea and vomiting)    WAKES UP CRYING  . Shortness of breath dyspnea    stairs. related to wt.  . Sleep apnea    has CPAP. has not used since 2011  . Vertigo    2x in last yr    Surgical History: Past Surgical History:  Procedure Laterality Date  . ABDOMINAL HYSTERECTOMY  2012   cervical dysplasia/ovaries remian  . CHOLECYSTECTOMY    . COLONOSCOPY WITH PROPOFOL N/A 06/19/2015   Procedure: COLONOSCOPY WITH PROPOFOL;  Surgeon: [name], MD;  Location: [facility];  Service: Endoscopy;  Laterality: N/A;  Diabetic - oral meds   .  DENTAL SURGERY    . ETHMOIDECTOMY Bilateral 04/17/2018   Procedure: ETHMOIDECTOMY;  Surgeon: [name], MD;  Location: [facility];  Service: ENT;  Laterality: Bilateral;  . GALLBLADDER SURGERY    . IMAGE GUIDED SINUS SURGERY N/A 04/17/2018   Procedure: IMAGE GUIDED SINUS SURGERY;  Surgeon: [name], MD;  Location: [facility];  Service: ENT;  Laterality: N/A;  . MAXILLARY ANTROSTOMY Bilateral 04/17/2018   Procedure: MAXILLARY ANTROSTOMY;  Surgeon: [name],  MD;  Location: [facility];  Service: ENT;  Laterality: Bilateral;  . NASAL SEPTOPLASTY W/ TURBINOPLASTY Bilateral 04/17/2018   Procedure: NASAL SEPTOPLASTY WITH TURBINATE REDUCTION;  Surgeon: [name], MD;  Location: [facility];  Service: ENT;  Laterality: Bilateral;  . TRANSURETHRAL RESECTION OF BLADDER TUMOR N/A 04/17/2018   Procedure: NTRANSURETHRAL RESECTION OF BLADDER TUMOR (TURBT) WITH GEMCITABINE;  Surgeon: [name], MD;  Location: [facility];  Service: Urology;  Laterality: N/A;    Home Medications:  Allergies as of 04/28/2020      Reactions   Hctz [hydrochlorothiazide] Cough   Lisinopril Cough   Latex Rash   Condoms only   Prednisone Anxiety   Paranoia      Medication List       Accurate as of April 28, 2020  9:39 AM. If you have any questions, ask your nurse or doctor.        STOP taking these medications   Trelegy Ellipta 100-62.5-25 MCG/INH Aepb Generic drug: Fluticasone-Umeclidin-Vilant Stopped by: Zara Council, PA-C     TAKE these medications   amLODipine 5 MG tablet Commonly known as: NORVASC Take 1 tablet (5 mg total) by mouth daily.   benzonatate 200 MG capsule Commonly known as: TESSALON Take 1 capsule (200 mg total) by mouth 3 (three) times daily as needed for cough.   blood glucose meter kit and supplies by XX route as directed   Cholecalciferol 125 MCG (5000 UT) capsule Take 1 capsule (5,000 Units total) by mouth daily.   Contour Next Test test strip Generic drug: glucose blood USE TO CHECK BS UP TO 3 TIMES DAILY FOR DIABETES DX: E11.9   Dulaglutide 1.5 MG/0.5ML Sopn Inject 1.5 mg into the skin once a week.   Fish Oil 1200 MG Caps Take 1,200 mg by mouth daily.   glipiZIDE 5 MG tablet Commonly known as: GLUCOTROL Take 5 mg by mouth every morning.   lamoTRIgine 100 MG tablet Commonly known as: LAMICTAL Take 300 mg by mouth daily.   levothyroxine 175 MCG tablet Commonly known as: SYNTHROID TAKE 1 TABLET (175 MCG TOTAL) BY  MOUTH DAILY. What changed: additional instructions   lidocaine 5 % Commonly known as: Lidoderm Place 1 patch onto the skin daily. Remove & Discard patch within 12 hours or as directed by MD   losartan 100 MG tablet Commonly known as: COZAAR TAKE 1/2 TABLET (50 MG TOTAL) BY MOUTH 2 (TWO) TIMES DAILY.   metFORMIN 500 MG tablet Commonly known as: GLUCOPHAGE Take 1 tablet (500 mg total) by mouth 3 (three) times daily. What changed:   how much to take  when to take this   mometasone 0.1 % cream Commonly known as: ELOCON Apply 1 application topically daily as needed.   multivitamin tablet Take 1 tablet by mouth daily.   omeprazole 40 MG capsule Commonly known as: PRILOSEC Take 1 capsule (40 mg total) by mouth daily.   ondansetron 4 MG disintegrating tablet Commonly known as: Zofran ODT Take 1 tablet (4 mg total) by mouth every 8 (eight)  hours as needed for nausea or vomiting.   vitamin C 1000 MG tablet Take 1,000 mg by mouth daily.       Allergies:  Allergies  Allergen Reactions  . Hctz [Hydrochlorothiazide] Cough  . Lisinopril Cough  . Latex Rash    Condoms only  . Prednisone Anxiety    Paranoia    Family History: Family History  Problem Relation Age of Onset  . Diabetes Maternal Grandmother   . Hypertension Maternal Grandmother   . Hyperlipidemia Maternal Grandmother   . Hypothyroidism Maternal Grandmother   . Heart disease Maternal Grandmother   . Diabetes Mother   . Hypertension Mother   . Hypothyroidism Mother   . Bipolar disorder Mother   . Heart disease Mother   . Alcohol abuse Father   . Depression Daughter   . Breast cancer Neg Hx     Social History:  reports that she quit smoking about 17 years ago. Her smoking use included cigarettes. She has a 10.00 pack-year smoking history. She has never used smokeless tobacco. She reports current alcohol use. She reports that she does not use drugs.   Physical Exam: BP (!) 150/93   Pulse 82   Ht 5'  6" (1.676 m)   Wt 295 lb (133.8 kg)   BMI 47.61 kg/m Constitutional:  Well nourished. Alert and oriented, No acute distress. HEENT: [place] AT, mask in place.  Trachea midline Cardiovascular: No clubbing, cyanosis, or edema. Respiratory: Normal respiratory effort, no increased work of breathing. Neurologic: Grossly intact, no focal deficits, moving all 4 extremities. Psychiatric: Normal mood and affect.    Laboratory Data: Lab Results  Component Value Date   CREATININE 0.91 02/20/2020   Lab Results  Component Value Date   HGBA1C 6.7% 03/19/2020    Urinalysis Component     Latest Ref Rng & Units 04/28/2020  Color, Urine     YELLOW YELLOW  Appearance     CLEAR CLEAR  Specific Gravity, Urine     1.005 - 1.030 1.015  pH     5.0 - 8.0 5.5  Glucose, UA     NEGATIVE mg/dL NEGATIVE  Hgb urine dipstick     NEGATIVE NEGATIVE  Bilirubin Urine     NEGATIVE NEGATIVE  Ketones, ur     NEGATIVE mg/dL NEGATIVE  Protein     NEGATIVE mg/dL NEGATIVE  Nitrite     NEGATIVE NEGATIVE  Leukocytes,Ua     NEGATIVE NEGATIVE  Squamous Epithelial / LPF     0 - 5 6-10  WBC, UA     0 - 5 WBC/hpf 0-5  RBC / HPF     0 - 5 RBC/hpf NONE SEEN  Bacteria, UA     NONE SEEN MANY (A)    Pertinent Imaging: Results for orders placed or performed in visit on 04/28/20  BLADDER SCAN AMB NON-IMAGING  Result Value Ref Range   Scan Result 67m    Assessment & Plan:    1. Intermediate risk hematuria Hematuria work up completed in 04/2018 - findings positive for NODULAR AREA OF CYSTITIS CYSTICA.  NEGATIVE FOR ATYPIA AND MALIGNANCY.   DEEPER SECTIONS EXAMINED.  BLADDER, TRIGONE; BIOPSY:  SQUAMOUS METAPLASIA, NON-KERATINIZING.  CYSTITIS CYSTICA WITH FOCAL CALCIFICATION. NEGATIVE FOR ATYPIA AND MALIGNANCY No gross hematuria. UA is negative for microscopic hematuria. RTC in 1 year with UA.  2. Bilateral angiomyolipomas Will order a renal ultrasound  3. Mixed incontinence Patient [facility]  [facility]  4. Nocturia Sleeping with CPAP machine  Return in about 1 year (around 04/28/2021) for UA, OAB questionnaire and PVR .  [place] [address] [place], [place] [address] [phone]  I, Selena Batten, am acting as a scribe for [employer],  I have reviewed the above documentation for accuracy and completeness, and I agree with the above.    Zara Council, PA-C

## 2020-04-28 ENCOUNTER — Other Ambulatory Visit
Admission: RE | Admit: 2020-04-28 | Discharge: 2020-04-28 | Disposition: A | Discharge status: Home / Self Care | Payer: BC Managed Care – PPO | Attending: Urology | Admitting: Urology

## 2020-04-28 ENCOUNTER — Other Ambulatory Visit: Payer: Self-pay

## 2020-04-28 ENCOUNTER — Encounter: Payer: Self-pay | Admitting: Urology

## 2020-04-28 ENCOUNTER — Ambulatory Visit (INDEPENDENT_AMBULATORY_CARE_PROVIDER_SITE_OTHER): Payer: BC Managed Care – PPO | Admitting: Urology

## 2020-04-28 VITALS — BP 150/93 | HR 82 | Ht 66.0 in | Wt 295.0 lb

## 2020-04-28 DIAGNOSIS — N3946 Mixed incontinence: Secondary | ICD-10-CM | POA: Diagnosis not present

## 2020-04-28 DIAGNOSIS — R351 Nocturia: Secondary | ICD-10-CM | POA: Diagnosis not present

## 2020-04-28 DIAGNOSIS — D1771 Benign lipomatous neoplasm of kidney: Secondary | ICD-10-CM | POA: Diagnosis not present

## 2020-04-28 DIAGNOSIS — R319 Hematuria, unspecified: Secondary | ICD-10-CM

## 2020-04-28 LAB — URINALYSIS, COMPLETE (UACMP) WITH MICROSCOPIC
Bilirubin Urine: NEGATIVE
Glucose, UA: NEGATIVE mg/dL
Hgb urine dipstick: NEGATIVE
Ketones, ur: NEGATIVE mg/dL
Leukocytes,Ua: NEGATIVE
Nitrite: NEGATIVE
Protein, ur: NEGATIVE mg/dL
RBC / HPF: NONE SEEN RBC/hpf (ref 0–5)
Specific Gravity, Urine: 1.015 (ref 1.005–1.030)
pH: 5.5 (ref 5.0–8.0)

## 2020-04-28 LAB — BLADDER SCAN AMB NON-IMAGING

## 2020-05-01 NOTE — Telephone Encounter (Signed)
Issue resolved. Patient contacted.

## 2020-05-05 ENCOUNTER — Ambulatory Visit: Payer: 59

## 2020-05-07 ENCOUNTER — Ambulatory Visit: Payer: BC Managed Care – PPO | Admitting: Nurse Practitioner

## 2020-05-07 ENCOUNTER — Other Ambulatory Visit: Payer: Self-pay

## 2020-05-07 ENCOUNTER — Encounter: Payer: Self-pay | Admitting: Nurse Practitioner

## 2020-05-07 DIAGNOSIS — E1159 Type 2 diabetes mellitus with other circulatory complications: Secondary | ICD-10-CM

## 2020-05-07 DIAGNOSIS — E1169 Type 2 diabetes mellitus with other specified complication: Secondary | ICD-10-CM

## 2020-05-07 DIAGNOSIS — E039 Hypothyroidism, unspecified: Secondary | ICD-10-CM | POA: Diagnosis not present

## 2020-05-07 DIAGNOSIS — N959 Unspecified menopausal and perimenopausal disorder: Secondary | ICD-10-CM

## 2020-05-07 DIAGNOSIS — I152 Hypertension secondary to endocrine disorders: Secondary | ICD-10-CM

## 2020-05-07 DIAGNOSIS — E785 Hyperlipidemia, unspecified: Secondary | ICD-10-CM

## 2020-05-07 NOTE — Patient Instructions (Signed)
Menopause Menopause is the normal time of life when menstrual periods stop completely. It is usually confirmed by 12 months without a menstrual period. The transition to menopause (perimenopause) most often happens between the ages of 45 and 55. During perimenopause, hormone levels change in your body, which can cause symptoms and affect your health. Menopause may increase your risk for:  Loss of bone (osteoporosis), which causes bone breaks (fractures).  Depression.  Hardening and narrowing of the arteries (atherosclerosis), which can cause heart attacks and strokes. What are the causes? This condition is usually caused by a natural change in hormone levels that happens as you get older. The condition may also be caused by surgery to remove both ovaries (bilateral oophorectomy). What increases the risk? This condition is more likely to start at an earlier age if you have certain medical conditions or treatments, including:  A tumor of the pituitary gland in the brain.  A disease that affects the ovaries and hormone production.  Radiation treatment for cancer.  Certain cancer treatments, such as chemotherapy or hormone (anti-estrogen) therapy.  Heavy smoking and excessive alcohol use.  Family history of early menopause. This condition is also more likely to develop earlier in women who are very thin. What are the signs or symptoms? Symptoms of this condition include:  Hot flashes.  Irregular menstrual periods.  Night sweats.  Changes in feelings about sex. This could be a decrease in sex drive or an increased comfort around your sexuality.  Vaginal dryness and thinning of the vaginal walls. This may cause painful intercourse.  Dryness of the skin and development of wrinkles.  Headaches.  Problems sleeping (insomnia).  Mood swings or irritability.  Memory problems.  Weight gain.  Hair growth on the face and chest.  Bladder infections or problems with urinating. How  is this diagnosed? This condition is diagnosed based on your medical history, a physical exam, your age, your menstrual history, and your symptoms. Hormone tests may also be done. How is this treated? In some cases, no treatment is needed. You and your health care provider should make a decision together about whether treatment is necessary. Treatment will be based on your individual condition and preferences. Treatment for this condition focuses on managing symptoms. Treatment may include:  Menopausal hormone therapy (MHT).  Medicines to treat specific symptoms or complications.  Acupuncture.  Vitamin or herbal supplements. Before starting treatment, make sure to let your health care provider know if you have a personal or family history of:  Heart disease.  Breast cancer.  Blood clots.  Diabetes.  Osteoporosis. Follow these instructions at home: Lifestyle  Do not use any products that contain nicotine or tobacco, such as cigarettes and e-cigarettes. If you need help quitting, ask your health care provider.  Get at least 30 minutes of physical activity on 5 or more days each week.  Avoid alcoholic and caffeinated beverages, as well as spicy foods. This may help prevent hot flashes.  Get 7-8 hours of sleep each night.  If you have hot flashes, try: ? Dressing in layers. ? Avoiding things that may trigger hot flashes, such as spicy food, warm places, or stress. ? Taking slow, deep breaths when a hot flash starts. ? Keeping a fan in your home and office.  Find ways to manage stress, such as deep breathing, meditation, or journaling.  Consider going to group therapy with other women who are having menopause symptoms. Ask your health care provider about recommended group therapy meetings. Eating and   drinking  Eat a healthy, balanced diet that contains whole grains, lean protein, low-fat dairy, and plenty of fruits and vegetables.  Your health care provider may recommend  adding more soy to your diet. Foods that contain soy include tofu, tempeh, and soy milk.  Eat plenty of foods that contain calcium and vitamin D for bone health. Items that are rich in calcium include low-fat milk, yogurt, beans, almonds, sardines, broccoli, and kale. Medicines  Take over-the-counter and prescription medicines only as told by your health care provider.  Talk with your health care provider before starting any herbal supplements. If prescribed, take vitamins and supplements as told by your health care provider. These may include: ? Calcium. Women age 51 and older should get 1,200 mg (milligrams) of calcium every day. ? Vitamin D. Women need 600-800 International Units of vitamin D each day. ? Vitamins B12 and B6. Aim for 50 micrograms of B12 and 1.5 mg of B6 each day. General instructions  Keep track of your menstrual periods, including: ? When they occur. ? How heavy they are and how long they last. ? How much time passes between periods.  Keep track of your symptoms, noting when they start, how often you have them, and how long they last.  Use vaginal lubricants or moisturizers to help with vaginal dryness and improve comfort during sex.  Keep all follow-up visits as told by your health care provider. This is important. This includes any group therapy or counseling. Contact a health care provider if:  You are still having menstrual periods after age 55.  You have pain during sex.  You have not had a period for 12 months and you develop vaginal bleeding. Get help right away if:  You have: ? Severe depression. ? Excessive vaginal bleeding. ? Pain when you urinate. ? A fast or irregular heart beat (palpitations). ? Severe headaches. ? Abdomen (abdominal) pain or severe indigestion.  You fell and you think you have a broken bone.  You develop leg or chest pain.  You develop vision problems.  You feel a lump in your breast. Summary  Menopause is the normal  time of life when menstrual periods stop completely. It is usually confirmed by 12 months without a menstrual period.  The transition to menopause (perimenopause) most often happens between the ages of 45 and 55.  Symptoms can be managed through medicines, lifestyle changes, and complementary therapies such as acupuncture.  Eat a balanced diet that is rich in nutrients to promote bone health and heart health and to manage symptoms during menopause. This information is not intended to replace advice given to you by your health care provider. Make sure you discuss any questions you have with your health care provider. Document Revised: 06/10/2017 Document Reviewed: 07/31/2016 Elsevier Patient Education  2020 [employer].  

## 2020-05-07 NOTE — Assessment & Plan Note (Signed)
Chronic, ongoing.  Continue fish oil.  Will check lipid panel next visit and discuss initiating statin, however her goal is medication reduction so unsure she will wish to start this.  Referral to weight management at [facility] placed, she wishes to focus less on medications and more on weight loss to help reduce her medication intake.  Return in 6 months.

## 2020-05-07 NOTE — Progress Notes (Signed)
BP 138/88 (BP Location: Left Arm, Patient Position: Sitting, Cuff Size: Large)   Pulse 80   Temp 98.8 F (37.1 C) (Oral)   Resp 16   Wt 294 lb (133.4 kg)   SpO2 98%   BMI 47.45 kg/m    Subjective:    Patient ID: Lori Knight, female    DOB: [birth year], 49 y.o.   MRN: [id number]  HPI: Lori Knight is a 50 y.o. female  Chief Complaint  Patient presents with  . Hyperlipidemia  . Diabetes  . Hypertension   DIABETES Followed by endocrinology and last seen 04/15/2020 with her A1C 6.7%. Continues on Metformin 1000 MG BID (intolerant to higher dosing), Trulicity 1.5 MG weekly, and Glipizide 5 MG daily (was decreased to 1/2 pill at recent visit with endo).  Her goal is weight loss, she reports currently being size 24 and that when she lost weight before and was size 14 she was able to come off a lot of her medications, which is her goal at this time.  Referral to [facility] weight management placed last visit, is on waiting list. Hypoglycemic episodes:no Polydipsia/polyuria:occasional, but sees urology for nocturia issues, last saw on 04/28/20 Visual disturbance:no Chest pain:no Paresthesias:no Glucose Monitoring:no Accucheck frequency: daily Fasting glucose: this morning 171, later in morning 130 Post prandial: Evening: Before meals: Taking Insulin?:no Long acting insulin: Short acting insulin: Blood Pressure Monitoring:not checking Retinal Examination:Up to Date Foot Exam:Up to Date Diabetic Education:Completed Pneumovax:Up to Date Influenza:Up to Date Aspirin:no  HYPOTHYROIDISM Followed by endocrinology and continues on Levothyroxine 175 MCG 6 days per week, takes 2 tablets on Sunday. TSH on 12/05/19 was 1.527.  Would like hormonal testing today Thyroid control status:stable Satisfied with current treatment?yes Medication side effects:no Medication  compliance:good compliance Etiology of hypothyroidism:  Recent dose adjustment:no Fatigue:no Cold intolerance:no Heat intolerance:no Weight gain:no Weight loss:no Constipation:no Diarrhea/loose stools:no Palpitations:no Lower extremity edema:no Anxiety/depressed mood:no  HYPERTENSION / HYPERLIPIDEMIA Continues on Losartan 100 MG, Amlodipine 5 MG and no current statin, but does use Fish Oil. Recent LDL 119.  Has CPAP using 3-4 nights a week, has a new mask that works better.    Her last echo in 2019 noted EF 50-55%. Is currently being followed by pulmonary for a chronic cough, tried Trelegy but caused side effects and Tessalon + Prilosec. Has history of smoking, smoked for 9 years and quit in 2004. Satisfied with current treatment?yes Duration of hypertension:chronic BP monitoring frequency:not checking BP range:  BP medication side effects:no Duration of hyperlipidemia:chronic Aspirin:no Recent stressors:no Recurrent headaches:no Visual changes:no Palpitations:no Dyspnea:no Chest pain:no Lower extremity edema:no Dizzy/lightheaded:no The ASCVD Risk score Mikey Bussing DC Jr., et al., 2013) failed to calculate for the following reasons: The patient has a prior MI or stroke diagnosis  Relevant past medical, surgical, family and social history reviewed and updated as indicated. Interim medical history since our last visit reviewed. Allergies and medications reviewed and updated.  Review of Systems  Constitutional: Negative for activity change, appetite change, diaphoresis, fatigue and fever.  Respiratory: Negative for cough, chest tightness and shortness of breath.   Cardiovascular: Negative for chest pain, palpitations and leg swelling.  Gastrointestinal: Negative.   Endocrine: Negative for cold intolerance, heat intolerance, polydipsia, polyphagia and polyuria.  Neurological: Negative.   Psychiatric/Behavioral: Negative.     Per HPI unless  specifically indicated above     Objective:    BP 138/88 (BP Location: Left Arm, Patient Position: Sitting, Cuff Size: Large)   Pulse 80   Temp 98.8  F (37.1 C) (Oral)   Resp 16   Wt 294 lb (133.4 kg)   SpO2 98%   BMI 47.45 kg/m   Wt Readings from Last 3 Encounters:  05/07/20 294 lb (133.4 kg)  04/28/20 295 lb (133.8 kg)  04/15/20 295 lb (133.8 kg)    Physical Exam Vitals and nursing note reviewed.  Constitutional:      General: She is awake. She is not in acute distress.    Appearance: She is well-developed and well-groomed. She is morbidly obese. She is not ill-appearing.  HENT:     Head: Normocephalic.     Right Ear: Hearing normal.     Left Ear: Hearing normal.  Eyes:     General: Lids are normal.        Right eye: No discharge.        Left eye: No discharge.     Conjunctiva/sclera: Conjunctivae normal.     Pupils: Pupils are equal, round, and reactive to light.  Neck:     Thyroid: No thyromegaly.     Vascular: No carotid bruit.  Cardiovascular:     Rate and Rhythm: Normal rate and regular rhythm.     Heart sounds: Normal heart sounds. No murmur heard.  No gallop.   Pulmonary:     Effort: Pulmonary effort is normal. No accessory muscle usage or respiratory distress.     Breath sounds: Normal breath sounds.  Abdominal:     General: Bowel sounds are normal.     Palpations: Abdomen is soft.  Musculoskeletal:     Cervical back: Normal range of motion and neck supple.     Right lower leg: No edema.     Left lower leg: No edema.  Lymphadenopathy:     Cervical: No cervical adenopathy.  Skin:    General: Skin is warm and dry.  Neurological:     Mental Status: She is alert and oriented to person, place, and time.  Psychiatric:        Attention and Perception: Attention normal.        Mood and Affect: Mood normal.        Speech: Speech normal.        Behavior: Behavior normal. Behavior is cooperative.        Thought Content: Thought content normal.      Results for orders placed or performed during the hospital encounter of 04/28/20  Urinalysis, Complete w Microscopic  Result Value Ref Range   Color, Urine YELLOW YELLOW   APPearance CLEAR CLEAR   Specific Gravity, Urine 1.015 1.005 - 1.030   pH 5.5 5.0 - 8.0   Glucose, UA NEGATIVE NEGATIVE mg/dL   Hgb urine dipstick NEGATIVE NEGATIVE   Bilirubin Urine NEGATIVE NEGATIVE   Ketones, ur NEGATIVE NEGATIVE mg/dL   Protein, ur NEGATIVE NEGATIVE mg/dL   Nitrite NEGATIVE NEGATIVE   Leukocytes,Ua NEGATIVE NEGATIVE   Squamous Epithelial / LPF 6-10 0 - 5   WBC, UA 0-5 0 - 5 WBC/hpf   RBC / HPF NONE SEEN 0 - 5 RBC/hpf   Bacteria, UA MANY (A) NONE SEEN      Assessment & Plan:   Problem List Items Addressed This Visit      Cardiovascular and Mediastinum   Hypertension associated with diabetes ([place])    Chronic, ongoing with BP at goal today in office.  Continue current medication regimen and adjust as needed.  Focus on DASH diet and regular exercise at home.  Recommend she check BP at  few days a week at home and document for provider.  Referral to weight management at [facility] has been placed, she wishes to focus less on medications and more on weight loss to help reduce her medication intake.  Return in 6 months.        Endocrine   Adult hypothyroidism    Chronic, stable, managed by endocrinology.  Continue current medication regimen as prescribed by endocrinology and adjust as needed.  Recent labs obtained by endocrinology and within normal range.      Type 2 diabetes mellitus with morbid obesity (HCC) - Primary    Chronic, ongoing with recent A1C at endocrinology 6.7%.  Continue current medication regimen as prescribed by endocrinology and adjust as needed.  Educated on BS goals in morning and 2 hours after meals.  Referral to weight management at [facility] has been placed, she wishes to focus less on medications and more on weight loss to help reduce her medication intake.  Return in 6  months.      Hyperlipidemia associated with type 2 diabetes mellitus (HCC)    Chronic, ongoing.  Continue fish oil.  Will check lipid panel next visit and discuss initiating statin, however her goal is medication reduction so unsure she will wish to start this.  Referral to weight management at [facility] placed, she wishes to focus less on medications and more on weight loss to help reduce her medication intake.  Return in 6 months.        Other   Obesity, Class III, BMI 40-49.9 (morbid obesity) ([place])    She is on wait list for [facility] weight management.  Recommended eating smaller high protein, low fat meals more frequently and exercising 30 mins a day 5 times a week with a goal of 10-15lb weight loss in the next 3 months. Patient voiced their understanding and motivation to adhere to these recommendations.        Other Visit Diagnoses    Menopausal disorder       Will check hormone levels today per request.   Relevant Orders   FSH/LH       Follow up plan: Return in about 6 months (around 11/05/2020) for T2DM, HTN/HLD, MOOD.

## 2020-05-07 NOTE — Assessment & Plan Note (Signed)
Chronic, ongoing with recent A1C at endocrinology 6.7%.  Continue current medication regimen as prescribed by endocrinology and adjust as needed.  Educated on BS goals in morning and 2 hours after meals.  Referral to weight management at [facility] has been placed, she wishes to focus less on medications and more on weight loss to help reduce her medication intake.  Return in 6 months.

## 2020-05-07 NOTE — Assessment & Plan Note (Signed)
Chronic, stable, managed by endocrinology.  Continue current medication regimen as prescribed by endocrinology and adjust as needed.  Recent labs obtained by endocrinology and within normal range.

## 2020-05-07 NOTE — Assessment & Plan Note (Addendum)
She is on wait list for [facility] weight management.  Recommended eating smaller high protein, low fat meals more frequently and exercising 30 mins a day 5 times a week with a goal of 10-15lb weight loss in the next 3 months. Patient voiced their understanding and motivation to adhere to these recommendations.

## 2020-05-07 NOTE — Assessment & Plan Note (Addendum)
Chronic, ongoing with BP at goal today in office.  Continue current medication regimen and adjust as needed.  Focus on DASH diet and regular exercise at home.  Recommend she check BP at few days a week at home and document for provider.  Referral to weight management at [facility] has been placed, she wishes to focus less on medications and more on weight loss to help reduce her medication intake.  Return in 6 months.

## 2020-05-08 LAB — FSH/LH
FSH: 22 m[IU]/mL
LH: 22.3 m[IU]/mL

## 2020-05-08 NOTE — Progress Notes (Signed)
Contacted via [place] morning Lori Knight, based on these labs I suspect you are in menopausal or postmenopausal phase.  Do you still have menstrual cycles?  Any symptoms, hot flashes, night sweats?  Let me know.  Your LH looks postmenopausal. Keep being awesome!!  Thank you for allowing me to participate in your care. Kindest regards, [name]

## 2020-05-22 ENCOUNTER — Ambulatory Visit
Admission: RE | Admit: 2020-05-22 | Discharge: 2020-05-22 | Disposition: A | Discharge status: Home / Self Care | Payer: Worker's Compensation | Source: Ambulatory Visit | Attending: Family Medicine | Admitting: Family Medicine

## 2020-05-22 ENCOUNTER — Ambulatory Visit: Payer: BC Managed Care – PPO | Admitting: Nurse Practitioner

## 2020-05-22 ENCOUNTER — Ambulatory Visit: Payer: 59 | Attending: Family Medicine

## 2020-05-22 ENCOUNTER — Other Ambulatory Visit: Payer: Self-pay

## 2020-05-22 VITALS — BP 161/92 | HR 78 | Temp 98.7°F | Resp 16 | Ht 66.0 in | Wt 290.0 lb

## 2020-05-22 DIAGNOSIS — W19XXXA Unspecified fall, initial encounter: Secondary | ICD-10-CM | POA: Diagnosis not present

## 2020-05-22 DIAGNOSIS — S8991XA Unspecified injury of right lower leg, initial encounter: Secondary | ICD-10-CM | POA: Diagnosis not present

## 2020-05-22 DIAGNOSIS — S99921A Unspecified injury of right foot, initial encounter: Secondary | ICD-10-CM

## 2020-05-22 DIAGNOSIS — S99911A Unspecified injury of right ankle, initial encounter: Secondary | ICD-10-CM

## 2020-05-22 DIAGNOSIS — M7918 Myalgia, other site: Secondary | ICD-10-CM

## 2020-05-22 DIAGNOSIS — M545 Low back pain, unspecified: Secondary | ICD-10-CM

## 2020-05-22 MED ORDER — MELOXICAM 15 MG PO TABS: 15.0000 mg | ORAL_TABLET | Freq: Every day | ORAL | 0 refills | Status: DC | PRN

## 2020-05-22 NOTE — ED Triage Notes (Signed)
Patient complains of a fall that occurred on Saturday. 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 States that pain has been constant and feels like her right ankle has worsened. States that it feels tight. Reports that she is also having lower back pain. Denies hitting her head or LOC.

## 2020-05-22 NOTE — ED Provider Notes (Signed)
[facility]    CSN: [id number] Arrival date & time: 05/22/20  1700      History   Chief Complaint Chief Complaint  Patient presents with  . Work Related Injury  . Fall  . Ankle Pain  . Back Pain   HPI   49 year old female presents with the above complaints.  This is a workman's comp injury.  Patient works in Personal assistant.  She states that she suffered a fall while trying to close a door from a house.  She states that she subsequently fell into the door and into the house.  She injured her right foot, right ankle, right knee.  She is also experiencing low back pain.  Denies hitting her head.  No loss of consciousness.  Patient states that she has been icing and using icy hot without resolution.  She was advised to come in for evaluation.  Pain currently 6/10 in severity.  No other associated symptoms.    Past Medical History:  Diagnosis Date  . Adult hypothyroidism 06/21/2012  . Bilateral polycystic ovarian syndrome 06/21/2012  . Biliary calculus with cholecystitis 08/14/2012  . Bipolar 1 disorder ([place])   . BP (high blood pressure) 02/14/2014  . Bronchitis   . Diabetes mellitus ([place]) 08/14/2012  . Diabetes mellitus without complication ([place])    pre-diabetic  . Dysrhythmia    wore heart monitor 2016. Corrected by changing Levothyroxine dose.  Marland Kitchen Heart murmur    followed by PCP-AS A CHILD-ASYMPTOMATIC  . Hyperprolactinemia ([place]) 06/21/2012  . Hypertension   . Hypoxia 04/17/2018  . Motion sickness    carnival rides  . PONV (postoperative nausea and vomiting)    WAKES UP CRYING  . Shortness of breath dyspnea    stairs. related to wt.  . Sleep apnea    has CPAP. has not used since 2011  . Vertigo    2x in last yr    Patient Active Problem List   Diagnosis Date Noted  . Type 2 diabetes mellitus with proteinuria ([place]) 02/20/2020  . Right elbow pain 02/20/2020  . Meningioma ([place]) 12/24/2019  . Hyperlipidemia associated with type 2 diabetes mellitus ([place])  08/13/2019  . Gastroesophageal reflux disease without esophagitis 06/20/2019  . Elevated liver function tests 12/26/2018  . Elevated ferritin 12/20/2018  . Cough in adult patient 12/26/2017  . Spasm of muscle of lower back 05/11/2017  . Fibroma of foot 03/03/2016  . Neuropathic pain of finger 03/03/2016  . Type 2 diabetes mellitus with morbid obesity ([place]) 12/01/2015  . OSA on CPAP 09/22/2015  . Insomnia 08/28/2015  . Internal hemorrhoids 08/28/2015  . Vitamin D deficiency 08/28/2015  . Obesity, Class III, BMI 40-49.9 (morbid obesity) ([place]) 08/27/2015  . Hypertension associated with diabetes ([place]) 02/14/2014  . Benign paroxysmal positional nystagmus 02/14/2014  . 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 Bipolar I disorder, single manic episode ([place]) 08/14/2012  . Adult hypothyroidism 06/21/2012  . Bilateral polycystic ovarian syndrome 06/21/2012    Past Surgical History:  Procedure Laterality Date  . ABDOMINAL HYSTERECTOMY  2012   cervical dysplasia/ovaries remian  . CHOLECYSTECTOMY    . COLONOSCOPY WITH PROPOFOL N/A 06/19/2015   Procedure: COLONOSCOPY WITH PROPOFOL;  Surgeon: [name], MD;  Location: [facility];  Service: Endoscopy;  Laterality: N/A;  Diabetic - oral meds   . DENTAL SURGERY    . ETHMOIDECTOMY Bilateral 04/17/2018   Procedure: ETHMOIDECTOMY;  Surgeon: [name], MD;  Location: [facility];  Service: ENT;  Laterality: Bilateral;  .  GALLBLADDER SURGERY    . IMAGE GUIDED SINUS SURGERY N/A 04/17/2018   Procedure: IMAGE GUIDED SINUS SURGERY;  Surgeon: [name], MD;  Location: [facility];  Service: ENT;  Laterality: N/A;  . MAXILLARY ANTROSTOMY Bilateral 04/17/2018   Procedure: MAXILLARY ANTROSTOMY;  Surgeon: [name], MD;  Location: [facility];  Service: ENT;  Laterality: Bilateral;  . NASAL SEPTOPLASTY W/ TURBINOPLASTY Bilateral 04/17/2018   Procedure: NASAL SEPTOPLASTY WITH TURBINATE REDUCTION;  Surgeon: [name], MD;  Location: [facility];   Service: ENT;  Laterality: Bilateral;  . TRANSURETHRAL RESECTION OF BLADDER TUMOR N/A 04/17/2018   Procedure: NTRANSURETHRAL RESECTION OF BLADDER TUMOR (TURBT) WITH GEMCITABINE;  Surgeon: [name], MD;  Location: [facility];  Service: Urology;  Laterality: N/A;    OB History   No obstetric history on file.      Home Medications    Prior to Admission medications   Medication Sig Start Date End Date Taking? Authorizing Provider  amLODipine (NORVASC) 5 MG tablet Take 1 tablet (5 mg total) by mouth daily. 06/20/19  Yes [name], MD  Ascorbic Acid (VITAMIN C) 1000 MG tablet Take 1,000 mg by mouth daily.   Yes [provider]  blood glucose meter kit and supplies by XX route as directed 09/04/19 09/03/20 Yes [provider]  Cholecalciferol 5000 units capsule Take 1 capsule (5,000 Units total) by mouth daily. 11/28/15  Yes Plonk, Gwyndolyn Saxon, MD  CONTOUR NEXT TEST test strip USE TO CHECK BS UP TO 3 TIMES DAILY FOR DIABETES DX: E11.9 06/13/19  Yes [name], MD  Dulaglutide 1.5 MG/0.5ML SOPN Inject 1.5 mg into the skin once a week. 08/28/18  Yes [provider]  glipiZIDE (GLUCOTROL) 5 MG tablet Take 5 mg by mouth every morning. 06/04/19  Yes [provider]  lamoTRIgine (LAMICTAL) 100 MG tablet Take 300 mg by mouth daily. 12/22/19  Yes [provider]  levothyroxine (SYNTHROID, LEVOTHROID) 175 MCG tablet TAKE 1 TABLET (175 MCG TOTAL) BY MOUTH DAILY. Patient taking differently: Take 175 mcg by mouth daily. 2 tablets on Sun 09/05/17  Yes [name], MD  losartan (COZAAR) 100 MG tablet TAKE 1/2 TABLET (50 MG TOTAL) BY MOUTH 2 (TWO) TIMES DAILY. 04/23/19  Yes [name], MD  metFORMIN (GLUCOPHAGE) 500 MG tablet Take 1 tablet (500 mg total) by mouth 3 (three) times daily. Patient taking differently: Take 1,000 mg by mouth 2 (two) times daily with a meal.  07/10/18  Yes [name], MD  mometasone (ELOCON) 0.1 % cream Apply 1  application topically daily as needed.  11/30/19  Yes [provider]  Multiple Vitamin (MULTIVITAMIN) tablet Take 1 tablet by mouth daily.   Yes [provider]  Omega-3 Fatty Acids (FISH OIL) 1200 MG CAPS Take 1,200 mg by mouth daily.    Yes [provider]  omeprazole (PRILOSEC) 40 MG capsule Take 1 capsule (40 mg total) by mouth daily. 04/23/20  Yes Cannady, Jolene T, NP  ondansetron (ZOFRAN ODT) 4 MG disintegrating tablet Take 1 tablet (4 mg total) by mouth every 8 (eight) hours as needed for nausea or vomiting. 07/09/19  Yes Lamptey, Myrene Galas, MD  meloxicam (MOBIC) 15 MG tablet Take 1 tablet (15 mg total) by mouth daily as needed for pain. 05/22/20   Coral Spikes, DO  albuterol (VENTOLIN HFA) 108 (90 Base) MCG/ACT inhaler Inhale 2 puffs into the lungs every 4 (four) hours as needed for wheezing or shortness of breath. 11/23/18 08/04/19  [name], MD    Family History Family History  Problem Relation Age of Onset  . Diabetes Maternal Grandmother   . Hypertension Maternal Grandmother   . Hyperlipidemia Maternal Grandmother   . Hypothyroidism Maternal Grandmother   . Heart disease Maternal Grandmother   . Diabetes Mother   . Hypertension Mother   . Hypothyroidism Mother   . Bipolar disorder Mother   . Heart disease Mother   . Alcohol abuse Father   . Depression Daughter   . Breast cancer Neg Hx     Social History Social History   Tobacco Use  . Smoking status: Former Smoker    Packs/day: 1.00    Years: 10.00    Pack years: 10.00    Types: Cigarettes    Quit date: 07/12/2002    Years since quitting: 17.8  . Smokeless tobacco: Never Used  . Tobacco comment: quit 2004  Vaping Use  . Vaping Use: Never used  Substance Use Topics  . Alcohol use: Yes    Alcohol/week: 0.0 standard drinks    Comment: rarely  . Drug use: No     Allergies   Hctz [hydrochlorothiazide], Lisinopril, Latex, and Prednisone   Review of Systems Review of  Systems Per HPI  Physical Exam Triage Vital Signs ED Triage Vitals  Enc Vitals Group     BP 05/22/20 1723 (!) 161/92     Pulse Rate 05/22/20 1723 78     Resp 05/22/20 1723 16     Temp 05/22/20 1723 98.7 F (37.1 C)     Temp Source 05/22/20 1723 Oral     SpO2 05/22/20 1723 99 %     Weight 05/22/20 1721 290 lb (131.5 kg)     Height 05/22/20 1721 '5\' 6"'  (1.676 m)     Head Circumference --      Peak Flow --      Pain Score 05/22/20 1720 6     Pain Loc --      Pain Edu? --      Excl. in [place]? --    Updated Vital Signs BP (!) 161/92 (BP Location: Right Arm)   Pulse 78   Temp 98.7 F (37.1 C) (Oral)   Resp 16   Ht '5\' 6"'  (1.676 m)   Wt 131.5 kg   SpO2 99%   BMI 46.81 kg/m   Visual Acuity Right Eye Distance:   Left Eye Distance:   Bilateral Distance:    Right Eye Near:   Left Eye Near:    Bilateral Near:     Physical Exam Vitals and nursing note reviewed.  Constitutional:      General: She is not in acute distress.    Appearance: Normal appearance. She is obese. She is not ill-appearing.  HENT:     Head: Normocephalic and atraumatic.  Cardiovascular:     Rate and Rhythm: Normal rate and regular rhythm.  Pulmonary:     Effort: Pulmonary effort is normal.     Breath sounds: Normal breath sounds. No wheezing, rhonchi or rales.  Musculoskeletal:     Comments: Right foot and ankle -mild tenderness over both the medial and lateral aspects of the ankle.  Mild tenderness at the base of the fifth metatarsal.  Right knee -anterior joint line tenderness.  Tenderness over the patella.  Lumbar spine -patient has a discrete area of tenderness to the right lumbar spine.  Neurological:     Mental Status: She is alert.  Psychiatric:  Mood and Affect: Mood normal.        Behavior: Behavior normal.    UC Treatments / Results  Labs (all labs ordered are listed, but only abnormal results are displayed) Labs Reviewed - No data to display  EKG   Radiology DG Lumbar  Spine Complete  Result Date: 05/22/2020 CLINICAL DATA:  49 year old who fell five days ago and injured the RIGHT lower extremity. Low back pain. Initial encounter. EXAM: LUMBAR SPINE - COMPLETE 4+ VIEW COMPARISON:  04/29/2019. FINDINGS: Five non-rib-bearing lumbar vertebrae with anatomic alignment. No fractures. Mild disc space narrowing and endplate hypertrophic changes at L1-2 and L3-4, unchanged. Remaining disc spaces well-preserved. No pars defects. No significant facet arthropathy. Sacroiliac joints anatomically aligned without significant degenerative changes. IMPRESSION: 1. No acute osseous abnormality. 2. Stable mild degenerative disc disease and spondylosis at L1-2 and L3-4. Electronically Signed   By: Evangeline Dakin M.D.   On: 05/22/2020 18:32   DG Ankle Complete Right  Result Date: 05/22/2020 CLINICAL DATA:  49 year old who fell five days ago and injured the RIGHT lower extremity. Low back pain. Initial encounter. EXAM: RIGHT ANKLE - COMPLETE 3+ VIEW COMPARISON:  None. FINDINGS: Mild diffuse soft tissue swelling. No evidence of acute fracture. Tibiotalar joint anatomically aligned with well-preserved joint space. Well-preserved bone mineral density. No intrinsic osseous abnormalities. No visible joint effusion. IMPRESSION: No osseous abnormality. Electronically Signed   By: Evangeline Dakin M.D.   On: 05/22/2020 18:29   DG Knee Complete 4 Views Right  Result Date: 05/22/2020 CLINICAL DATA:  49 year old who fell 5 days ago and injured the RIGHT lower extremity. Low back pain. Initial encounter. EXAM: RIGHT KNEE - COMPLETE 4+ VIEW COMPARISON:  None. FINDINGS: No evidence of acute fracture or dislocation. Well-preserved joint spaces. Well-preserved bone mineral density. No intrinsic osseous abnormality. Accessory ossicle adjacent to the head of the fibula. No visible joint effusion. IMPRESSION: Normal examination. Electronically Signed   By: Evangeline Dakin M.D.   On: 05/22/2020 18:33   DG  Foot Complete Right  Result Date: 05/22/2020 CLINICAL DATA:  49 year old who fell five days ago and injured the RIGHT lower extremity. Low back pain. Initial encounter. EXAM: RIGHT FOOT COMPLETE - 3+ VIEW COMPARISON:  None. FINDINGS: No evidence of acute or subacute fracture or dislocation. Well-preserved joint spaces. Well-preserved bone mineral density. Small plantar calcaneal spur. No other intrinsic osseous abnormalities. IMPRESSION: 1. No acute or subacute osseous abnormality. 2. Small plantar calcaneal spur. Electronically Signed   By: Evangeline Dakin M.D.   On: 05/22/2020 18:28    Procedures Procedures (including critical care time)  Medications Ordered in UC Medications - No data to display  Initial Impression / Assessment and Plan / UC Course  I have reviewed the triage vital signs and the nursing notes.  Pertinent labs & imaging results that were available during my care of the patient were reviewed by me and considered in my medical decision making (see chart for details).    49 year old female presents with musculoskeletal pain after suffering a fall.  X-rays obtained today were independently reviewed by me.  No acute fractures noted.  Meloxicam as needed.  Workmen's Comp. form filled out.  Supportive care.  Final Clinical Impressions(s) / UC Diagnoses   Final diagnoses:  Musculoskeletal pain  Fall, initial encounter     Discharge Instructions     Rest.  Medication as directed.  Take care  Dr. Lacinda Axon     ED Prescriptions    Medication Sig Dispense Auth. Provider  meloxicam (MOBIC) 15 MG tablet Take 1 tablet (15 mg total) by mouth daily as needed for pain. 30 tablet Coral Spikes, DO     PDMP not reviewed this encounter.   Coral Spikes, [place] 05/22/20 1844

## 2020-05-22 NOTE — Discharge Instructions (Signed)
Rest.  Medication as directed.  Take care  Dr. Fatisha Rabalais  

## 2020-05-23 ENCOUNTER — Other Ambulatory Visit: Payer: Self-pay | Admitting: Neurosurgery

## 2020-05-23 DIAGNOSIS — D329 Benign neoplasm of meninges, unspecified: Secondary | ICD-10-CM

## 2020-06-06 ENCOUNTER — Other Ambulatory Visit: Payer: Self-pay | Admitting: Internal Medicine

## 2020-06-06 DIAGNOSIS — I10 Essential (primary) hypertension: Secondary | ICD-10-CM

## 2020-06-13 ENCOUNTER — Other Ambulatory Visit: Payer: Self-pay | Admitting: Family Medicine

## 2020-06-24 ENCOUNTER — Encounter: Payer: BC Managed Care – PPO | Admitting: Internal Medicine

## 2020-07-03 ENCOUNTER — Other Ambulatory Visit: Payer: Self-pay | Admitting: Internal Medicine

## 2020-07-08 ENCOUNTER — Other Ambulatory Visit: Payer: Self-pay | Admitting: Radiology

## 2020-07-08 DIAGNOSIS — D1771 Benign lipomatous neoplasm of kidney: Secondary | ICD-10-CM

## 2020-07-12 DIAGNOSIS — U071 COVID-19: Secondary | ICD-10-CM

## 2020-07-12 HISTORY — DX: COVID-19: U07.1

## 2020-07-13 ENCOUNTER — Other Ambulatory Visit: Payer: Self-pay

## 2020-07-13 ENCOUNTER — Ambulatory Visit
Admission: EM | Admit: 2020-07-13 | Discharge: 2020-07-13 | Disposition: A | Discharge status: Home / Self Care | Payer: 59 | Attending: Emergency Medicine | Admitting: Emergency Medicine

## 2020-07-13 DIAGNOSIS — R52 Pain, unspecified: Secondary | ICD-10-CM | POA: Diagnosis not present

## 2020-07-13 DIAGNOSIS — U071 COVID-19: Secondary | ICD-10-CM | POA: Diagnosis not present

## 2020-07-13 LAB — CBC WITH DIFFERENTIAL/PLATELET
Abs Immature Granulocytes: 0.12 10*3/uL — ABNORMAL HIGH (ref 0.00–0.07)
Basophils Absolute: 0 10*3/uL (ref 0.0–0.1)
Basophils Relative: 1 %
Eosinophils Absolute: 0 10*3/uL (ref 0.0–0.5)
Eosinophils Relative: 0 %
HCT: 41.8 % (ref 36.0–46.0)
Hemoglobin: 13.4 g/dL (ref 12.0–15.0)
Immature Granulocytes: 2 %
Lymphocytes Relative: 17 %
Lymphs Abs: 1.2 10*3/uL (ref 0.7–4.0)
MCH: 27.5 pg (ref 26.0–34.0)
MCHC: 32.1 g/dL (ref 30.0–36.0)
MCV: 85.7 fL (ref 80.0–100.0)
Monocytes Absolute: 1.4 10*3/uL — ABNORMAL HIGH (ref 0.1–1.0)
Monocytes Relative: 20 %
Neutro Abs: 4.2 10*3/uL (ref 1.7–7.7)
Neutrophils Relative %: 60 %
Platelets: 251 10*3/uL (ref 150–400)
RBC: 4.88 MIL/uL (ref 3.87–5.11)
RDW: 16.1 % — ABNORMAL HIGH (ref 11.5–15.5)
WBC: 7 10*3/uL (ref 4.0–10.5)
nRBC: 0 % (ref 0.0–0.2)

## 2020-07-13 NOTE — ED Triage Notes (Signed)
Patient presents to Urgent Care with complaints of fever last temp.99.0, cough, sore throat, joint pain, headache, body aches since yesterday. Treating symptoms Tylenol.

## 2020-07-13 NOTE — Discharge Instructions (Addendum)
You were seen for body pain and are being treated for the same.   You were tested for COVID-19. If you know you were exposed to a confirmed case of COVID-19, continue quarantine until your test results are available.  Interact as little as possible until COVID-19 test results are available.  Continue to keep her social distance of 6 feet from others, wash hands frequently and wear facemask when indoors or when you are unable to social distance in outdoor settings.  If you develop any symptoms, reach out to the urgent care for further instructions.  If your test results are positive, a member of the urgent care team will reach out to you with further instructions. If you have any further questions, please don't hesitate to reach out to the urgent care clinic.  Over the counter medications that will help your symptoms: Flonase (runny nose, congestion), Zyrtec or Xyzal (postnasal drip, sneezing), Tylenol/Ibuprofen (fever and body aches).  Please sign up for MyChart to access your lab results.  Take care, Dr. Sharlet Salina, NP-c

## 2020-07-13 NOTE — ED Provider Notes (Addendum)
[facility] - [place], [place]   Name: Lori Knight DOB: 03/25/1971 MRN: [id number] CSN: [id number] PCP: [name], NP  Arrival date and time:  07/13/20 [id number]  Chief Complaint:  Generalized Body Aches, Fever, Sore Throat (/), and Cough   NOTE: Prior to seeing the patient today, I have reviewed the triage nursing documentation and vital signs. Clinical staff has updated patient's PMH/PSHx, current medication list, and drug allergies/intolerances to ensure comprehensive history available to assist in medical decision making.   History:   HPI: Lori Knight is a 50 y.o. female who presents today with complaints of the symptoms below.  COVID-19 assessment Symptoms: Body aches and headaches Symptom onset: 12/31 Vaccination status: Unvaccinated Positive exposure: No known positive exposure; husband had a positive exposure and has since tested negative. Social precautions: Wears a mask "for most of the time" Employment risk: High.  Works as a Cabin crew. Previous COVID-19 test result: None  Past Medical History:  Diagnosis Date  . Adult hypothyroidism 06/21/2012  . Bilateral polycystic ovarian syndrome 06/21/2012  . Biliary calculus with cholecystitis 08/14/2012  . Bipolar 1 disorder ([place])   . BP (high blood pressure) 02/14/2014  . Bronchitis   . Diabetes mellitus ([place]) 08/14/2012  . Diabetes mellitus without complication ([place])    pre-diabetic  . Dysrhythmia    wore heart monitor 2016. Corrected by changing Levothyroxine dose.  Marland Kitchen Heart murmur    followed by PCP-AS A CHILD-ASYMPTOMATIC  . Hyperprolactinemia ([place]) 06/21/2012  . Hypertension   . Hypoxia 04/17/2018  . Motion sickness    carnival rides  . PONV (postoperative nausea and vomiting)    WAKES UP CRYING  . Shortness of breath dyspnea    stairs. related to wt.  . Sleep apnea    has CPAP. has not used since 2011  . Vertigo    2x in last yr    Past Surgical History:  Procedure Laterality Date  .  ABDOMINAL HYSTERECTOMY  2012   cervical dysplasia/ovaries remian  . CHOLECYSTECTOMY    . COLONOSCOPY WITH PROPOFOL N/A 06/19/2015   Procedure: COLONOSCOPY WITH PROPOFOL;  Surgeon: [name], MD;  Location: [facility];  Service: Endoscopy;  Laterality: N/A;  Diabetic - oral meds   . DENTAL SURGERY    . ETHMOIDECTOMY Bilateral 04/17/2018   Procedure: ETHMOIDECTOMY;  Surgeon: [name], MD;  Location: [facility];  Service: ENT;  Laterality: Bilateral;  . GALLBLADDER SURGERY    . IMAGE GUIDED SINUS SURGERY N/A 04/17/2018   Procedure: IMAGE GUIDED SINUS SURGERY;  Surgeon: [name], MD;  Location: [facility];  Service: ENT;  Laterality: N/A;  . MAXILLARY ANTROSTOMY Bilateral 04/17/2018   Procedure: MAXILLARY ANTROSTOMY;  Surgeon: [name], MD;  Location: [facility];  Service: ENT;  Laterality: Bilateral;  . NASAL SEPTOPLASTY W/ TURBINOPLASTY Bilateral 04/17/2018   Procedure: NASAL SEPTOPLASTY WITH TURBINATE REDUCTION;  Surgeon: [name], MD;  Location: [facility];  Service: ENT;  Laterality: Bilateral;  . TRANSURETHRAL RESECTION OF BLADDER TUMOR N/A 04/17/2018   Procedure: NTRANSURETHRAL RESECTION OF BLADDER TUMOR (TURBT) WITH GEMCITABINE;  Surgeon: [name], MD;  Location: [facility];  Service: Urology;  Laterality: N/A;    Family History  Problem Relation Age of Onset  . Diabetes Maternal Grandmother   . Hypertension Maternal Grandmother   . Hyperlipidemia Maternal Grandmother   . Hypothyroidism Maternal Grandmother   . Heart disease Maternal Grandmother   . Diabetes Mother   . Hypertension Mother   . Hypothyroidism Mother   .  Bipolar disorder Mother   . Heart disease Mother   . Alcohol abuse Father   . Depression Daughter   . Breast cancer Neg Hx     Social History   Tobacco Use  . Smoking status: Former Smoker    Packs/day: 1.00    Years: 10.00    Pack years: 10.00    Types: Cigarettes    Quit date: 07/12/2002    Years since quitting: 18.0  . Smokeless  tobacco: Never Used  . Tobacco comment: quit 2004  Vaping Use  . Vaping Use: Never used  Substance Use Topics  . Alcohol use: Yes    Alcohol/week: 0.0 standard drinks    Comment: rarely  . Drug use: No    Patient Active Problem List   Diagnosis Date Noted  . Type 2 diabetes mellitus with proteinuria (HCC) 02/20/2020  . Right elbow pain 02/20/2020  . Meningioma (HCC) 12/24/2019  . Hyperlipidemia associated with type 2 diabetes mellitus (HCC) 08/13/2019  . Gastroesophageal reflux disease without esophagitis 06/20/2019  . Elevated liver function tests 12/26/2018  . Elevated ferritin 12/20/2018  . Cough in adult patient 12/26/2017  . Spasm of muscle of lower back 05/11/2017  . Fibroma of foot 03/03/2016  . Neuropathic pain of finger 03/03/2016  . Type 2 diabetes mellitus with morbid obesity (HCC) 12/01/2015  . OSA on CPAP 09/22/2015  . Insomnia 08/28/2015  . Internal hemorrhoids 08/28/2015  . Vitamin D deficiency 08/28/2015  . Obesity, Class III, BMI 40-49.9 (morbid obesity) (HCC) 08/27/2015  . Hypertension associated with diabetes (HCC) 02/14/2014  . Benign paroxysmal positional nystagmus 02/14/2014  . 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 Bipolar I disorder, single manic episode (HCC) 08/14/2012  . Adult hypothyroidism 06/21/2012  . Bilateral polycystic ovarian syndrome 06/21/2012    Home Medications:    No outpatient medications have been marked as taking for the 07/13/20 encounter [facility] Encounter).    Allergies:   Hctz [hydrochlorothiazide], Lisinopril, Latex, and Prednisone  Review of Systems (ROS): Review of Systems  Constitutional: Positive for fatigue. Negative for fever.  Gastrointestinal: Negative for nausea and vomiting.  Musculoskeletal: Positive for arthralgias and myalgias.  Neurological: Positive for headaches. Negative for weakness.     Vital Signs: Today's Vitals   07/13/20 1000 07/13/20 1001 07/13/20 1002 07/13/20 1113  BP:   (!) 157/102    Pulse:   96   Resp:   18   Temp:   99 F (37.2 C)   TempSrc:   Oral   SpO2:   96%   Weight:  300 lb (136.1 kg)    PainSc: 8    8     Physical Exam: Physical Exam Vitals and nursing note reviewed.  Constitutional:      Appearance: Normal appearance.  Cardiovascular:     Rate and Rhythm: Normal rate and regular rhythm.     Pulses: Normal pulses.     Heart sounds: Normal heart sounds.  Pulmonary:     Effort: Pulmonary effort is normal.     Breath sounds: Normal breath sounds.  Musculoskeletal:     Comments: Tenderness along spine and hips  Skin:    General: Skin is warm and dry.  Neurological:     General: No focal deficit present.     Mental Status: She is alert and oriented to person, place, and time.     Cranial Nerves: Cranial nerves are intact.     Coordination: Romberg sign negative.     Gait: Gait is  intact.  Psychiatric:        Mood and Affect: Mood normal.        Behavior: Behavior normal.      Urgent Care Treatments / Results:   LABS: PLEASE NOTE: all labs that were ordered this encounter are listed, however only abnormal results are displayed. Labs Reviewed  CBC WITH DIFFERENTIAL/PLATELET - Abnormal; Notable for the following components:      Result Value   RDW 16.1 (*)    Monocytes Absolute 1.4 (*)    Abs Immature Granulocytes 0.12 (*)    All other components within normal limits  SARS CORONAVIRUS 2 (TAT 6-24 HRS)    EKG: -None  RADIOLOGY: No results found.  PROCEDURES: Procedures  MEDICATIONS RECEIVED THIS VISIT: Medications - No data to display  PERTINENT CLINICAL COURSE NOTES/UPDATES:   Initial Impression / Assessment and Plan / Urgent Care Course:  Pertinent labs & imaging results that were available during my care of the patient were personally reviewed by me and considered in my medical decision making (see lab/imaging section of note for values and interpretations).  Lori Knight is a 50 y.o. female who presents to [facility] today with complaints of body aches, diagnosed with the same, and treated as such with the directions below.  Meningitis ruled out. NP and patient reviewed discharge instructions below during visit.   Patient is well appearing overall in clinic today. She does not appear to be in any acute distress. Presenting symptoms (see HPI) and exam as documented above.   I have reviewed the follow up and strict return precautions for any new or worsening symptoms. Patient is aware of symptoms that would be deemed urgent/emergent, and would thus require further evaluation either here or in the emergency department. At the time of discharge, she verbalized understanding and consent with the discharge plan as it was reviewed with her. All questions were fielded by provider and/or clinic staff prior to patient discharge.    Final Clinical Impressions / Urgent Care Diagnoses:   Final diagnoses:  Body aches    New Prescriptions:  [place] Controlled Substance Registry consulted? Not Applicable  No orders of the defined types were placed in this encounter.     Discharge Instructions     You were seen for body pain and are being treated for the same.   You were tested for COVID-19. If you know you were exposed to a confirmed case of COVID-19, continue quarantine until your test results are available.  Interact as little as possible until COVID-19 test results are available.  Continue to keep her social distance of 6 feet from others, wash hands frequently and wear facemask when indoors or when you are unable to social distance in outdoor settings.  If you develop any symptoms, reach out to the urgent care for further instructions.  If your test results are positive, a member of the urgent care team will reach out to you with further instructions. If you have any further questions, please don't hesitate to reach out to the urgent care clinic.  Over the counter medications that will help your symptoms:  Flonase (runny nose, congestion), Zyrtec or Xyzal (postnasal drip, sneezing), Tylenol/Ibuprofen (fever and body aches).  Please sign up for MyChart to access your lab results.  Take care, Dr. Marland Kitchen, NP-c     Recommended Follow up Care:  Patient encouraged to follow up with the following provider within the specified time frame, or sooner as dictated by the severity  of her symptoms. As always, she was instructed that for any urgent/emergent care needs, she should seek care either here or in the emergency department for more immediate evaluation.   Lori Baron, DNP, NP-c    Lori Baron, NP 07/13/20 [place], Lori Babinski, NP 07/13/20 1152

## 2020-07-14 LAB — SARS CORONAVIRUS 2 (TAT 6-24 HRS): SARS Coronavirus 2: POSITIVE — AB

## 2020-07-16 ENCOUNTER — Ambulatory Visit: Payer: 59

## 2020-07-16 ENCOUNTER — Other Ambulatory Visit: Payer: Self-pay | Admitting: Nurse Practitioner

## 2020-07-16 DIAGNOSIS — F309 Manic episode, unspecified: Secondary | ICD-10-CM

## 2020-07-21 ENCOUNTER — Ambulatory Visit: Payer: 59 | Admitting: Urology

## 2020-07-23 ENCOUNTER — Ambulatory Visit: Payer: 59

## 2020-07-31 ENCOUNTER — Ambulatory Visit
Admission: RE | Admit: 2020-07-31 | Discharge: 2020-07-31 | Disposition: A | Discharge status: Home / Self Care | Payer: 59 | Source: Ambulatory Visit | Attending: Urology | Admitting: Urology

## 2020-07-31 ENCOUNTER — Other Ambulatory Visit: Payer: Self-pay

## 2020-07-31 DIAGNOSIS — D1771 Benign lipomatous neoplasm of kidney: Secondary | ICD-10-CM | POA: Insufficient documentation

## 2020-08-06 ENCOUNTER — Ambulatory Visit: Payer: 59

## 2020-08-10 ENCOUNTER — Encounter (INDEPENDENT_AMBULATORY_CARE_PROVIDER_SITE_OTHER): Payer: Self-pay

## 2020-08-10 NOTE — Progress Notes (Incomplete)
04/28/2020 11:53 PM   Lori Knight 1971-06-29 [id number]  Referring provider: [name], NP [address] [place],   [id number] No chief complaint on file.   HPI: Lori Knight is a 50 y.o. female who returns for a 1 year follow up of nocturia, mixed incontinence, bilateral angiomyolipomas, and history of hematuria.   Intermediate risk hematuria Former smoker.  CTU 03/2018 revealed normal adrenal glands.  No kidney stones identified. No hydronephrosis, hydroureter or ureterolithiasis. Anterior right lower pole angiomyolipoma measures 3.7 cm, image 49/2. Tiny angiomyolipoma within the left kidney measures 6 mm. No suspicious kidney mass. Urinary bladder appears normal.  Cystoscopy in 04/2018 with Dr. Erlene Quan noted a small 1 cm round papillary tumor at left dome of bladder highly concerning for bladder cancer.  She underwent a TURBT in 04/2018.  Lesion found not to be malignant.  ***  Bilateral angiomyolipomas Found on 03/2018 CTU: 3.7 cm right lower pole angiomyolipoma and 6 mm left kidney angiomyolipoma.  RUS in 09/2018 revealed unchanged bilateral renal angiomyolipomas.  She was having bilateral flank pain.  Pain was mild. RUS from 04/19/2019 noted a 4.0 cm hyperechoic mass mid inferior RIGHT kidney consistent with angiomyolipoma. A 7 mm hyperechoic nodule LEFT kidney consistent with angiomyolipoma.  RUS 07/2020 No significant interval change in the known bilateral renal angiomyolipoma.  No hydronephrosis or other acute finding.  Mixed incontinence Patient has completed 12 sessions of PTNS.   The patient is  experiencing urgency x 0-3 (improved), frequency x 4-7 (improved), is restricting fluids to avoid visits to the restroom, is engaging in toilet mapping, incontinence x 0-3 (improved) and nocturia x 0-3 (improved).   Her BP is 150/93.   Her PVR is 22 mL.  Previous PVR was 0 mL. She has limited her sweet tea and caffeine consumption. She reports improvement in her in day  time and night time symptoms.   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 She loves her new job.   Nocturia Sleeping with CPAP machine more often now.     PMH: Past Medical History:  Diagnosis Date  . Adult hypothyroidism 06/21/2012  . Bilateral polycystic ovarian syndrome 06/21/2012  . Biliary calculus with cholecystitis 08/14/2012  . Bipolar 1 disorder ([place])   . BP (high blood pressure) 02/14/2014  . Bronchitis   . Diabetes mellitus ([place]) 08/14/2012  . Diabetes mellitus without complication ([place])    pre-diabetic  . Dysrhythmia    wore heart monitor 2016. Corrected by changing Levothyroxine dose.  Marland Kitchen Heart murmur    followed by PCP-AS A CHILD-ASYMPTOMATIC  . Hyperprolactinemia ([place]) 06/21/2012  . Hypertension   . Hypoxia 04/17/2018  . Motion sickness    carnival rides  . PONV (postoperative nausea and vomiting)    WAKES UP CRYING  . Shortness of breath dyspnea    stairs. related to wt.  . Sleep apnea    has CPAP. has not used since 2011  . Vertigo    2x in last yr    Surgical History: Past Surgical History:  Procedure Laterality Date  . ABDOMINAL HYSTERECTOMY  2012   cervical dysplasia/ovaries remian  . CHOLECYSTECTOMY    . COLONOSCOPY WITH PROPOFOL N/A 06/19/2015   Procedure: COLONOSCOPY WITH PROPOFOL;  Surgeon: [name], MD;  Location: [facility];  Service: Endoscopy;  Laterality: N/A;  Diabetic - oral meds   . DENTAL SURGERY    . ETHMOIDECTOMY Bilateral 04/17/2018   Procedure: ETHMOIDECTOMY;  Surgeon: [name], MD;  Location: [facility];  Service: ENT;  Laterality: Bilateral;  . GALLBLADDER SURGERY    . IMAGE GUIDED SINUS SURGERY N/A 04/17/2018   Procedure: IMAGE GUIDED SINUS SURGERY;  Surgeon: Vernie Murders, MD;  Location: [facility];  Service: ENT;  Laterality: N/A;  . MAXILLARY ANTROSTOMY Bilateral 04/17/2018   Procedure: MAXILLARY ANTROSTOMY;  Surgeon: Vernie Murders, MD;  Location: [facility];  Service: ENT;  Laterality: Bilateral;   . NASAL SEPTOPLASTY W/ TURBINOPLASTY Bilateral 04/17/2018   Procedure: NASAL SEPTOPLASTY WITH TURBINATE REDUCTION;  Surgeon: Vernie Murders, MD;  Location: [facility];  Service: ENT;  Laterality: Bilateral;  . TRANSURETHRAL RESECTION OF BLADDER TUMOR N/A 04/17/2018   Procedure: NTRANSURETHRAL RESECTION OF BLADDER TUMOR (TURBT) WITH GEMCITABINE;  Surgeon: Vanna Scotland, MD;  Location: [facility];  Service: Urology;  Laterality: N/A;    Home Medications:  Allergies as of 08/11/2020      Reactions   Hctz [hydrochlorothiazide] Cough   Lisinopril Cough   Latex Rash   Condoms only   Prednisone Anxiety   Paranoia      Medication List       Accurate as of August 10, 2020 11:53 PM. If you have any questions, ask your nurse or doctor.        amLODipine 5 MG tablet Commonly known as: NORVASC TAKE 1 TABLET BY MOUTH EVERY DAY   blood glucose meter kit and supplies by XX route as directed   Cholecalciferol 125 MCG (5000 UT) capsule Take 1 capsule (5,000 Units total) by mouth daily.   Contour Next Test test strip Generic drug: glucose blood USE TO CHECK BS UP TO 3 TIMES DAILY FOR DIABETES DX: E11.9   Dulaglutide 1.5 MG/0.5ML Sopn Inject 1.5 mg into the skin once a week.   Fish Oil 1200 MG Caps Take 1,200 mg by mouth daily.   glipiZIDE 5 MG tablet Commonly known as: GLUCOTROL Take 5 mg by mouth every morning.   lamoTRIgine 100 MG tablet Commonly known as: LAMICTAL Take 300 mg by mouth daily.   levothyroxine 175 MCG tablet Commonly known as: SYNTHROID TAKE 1 TABLET (175 MCG TOTAL) BY MOUTH DAILY. What changed: additional instructions   losartan 100 MG tablet Commonly known as: COZAAR TAKE 1/2 TABLET (50 MG TOTAL) BY MOUTH 2 (TWO) TIMES DAILY.   meloxicam 15 MG tablet Commonly known as: MOBIC Take 1 tablet (15 mg total) by mouth daily as needed for pain.   metFORMIN 500 MG tablet Commonly known as: GLUCOPHAGE Take 1 tablet (500 mg total) by mouth 3 (three) times  daily. What changed:   how much to take  when to take this   mometasone 0.1 % cream Commonly known as: ELOCON Apply 1 application topically daily as needed.   multivitamin tablet Take 1 tablet by mouth daily.   omeprazole 40 MG capsule Commonly known as: PRILOSEC Take 1 capsule (40 mg total) by mouth daily.   ondansetron 4 MG disintegrating tablet Commonly known as: Zofran ODT Take 1 tablet (4 mg total) by mouth every 8 (eight) hours as needed for nausea or vomiting.   vitamin C 1000 MG tablet Take 1,000 mg by mouth daily.       Allergies:  Allergies  Allergen Reactions  . Hctz [Hydrochlorothiazide] Cough  . Lisinopril Cough  . Latex Rash    Condoms only  . Prednisone Anxiety    Paranoia    Family History: Family History  Problem Relation Age of Onset  . Diabetes Maternal Grandmother   . Hypertension Maternal  Grandmother   . Hyperlipidemia Maternal Grandmother   . Hypothyroidism Maternal Grandmother   . Heart disease Maternal Grandmother   . Diabetes Mother   . Hypertension Mother   . Hypothyroidism Mother   . Bipolar disorder Mother   . Heart disease Mother   . Alcohol abuse Father   . Depression Daughter   . Breast cancer Neg Hx     Social History:  reports that she quit smoking about 18 years ago. Her smoking use included cigarettes. She has a 10.00 pack-year smoking history. She has never used smokeless tobacco. She reports current alcohol use. She reports that she does not use drugs.   Physical Exam: There were no vitals taken for this visit.  Constitutional:  Well nourished. Alert and oriented, No acute distress. HEENT: [place] AT, moist mucus membranes.  Trachea midline, no masses. Cardiovascular: No clubbing, cyanosis, or edema. Respiratory: Normal respiratory effort, no increased work of breathing. GI: Abdomen is soft, non tender, non distended, no abdominal masses. Liver and spleen not palpable.  No hernias appreciated.  Stool sample for occult  testing is not indicated.   GU: No CVA tenderness.  No bladder fullness or masses.  *** external genitalia, *** pubic hair distribution, no lesions.  Normal urethral meatus, no lesions, no prolapse, no discharge.   No urethral masses, tenderness and/or tenderness. No bladder fullness, tenderness or masses. *** vagina mucosa, *** estrogen effect, no discharge, no lesions, *** pelvic support, *** cystocele and *** rectocele noted.  No cervical motion tenderness.  Uterus is freely mobile and non-fixed.  No adnexal/parametria masses or tenderness noted.  Anus and perineum are without rashes or lesions.   ***  Skin: No rashes, bruises or suspicious lesions. Lymph: No cervical or inguinal adenopathy. Neurologic: Grossly intact, no focal deficits, moving all 4 extremities. Psychiatric: Normal mood and affect.   Laboratory Data: Component     Latest Ref Rng & Units 05/07/2020  LH     mIU/mL 22.3  FSH     mIU/mL 22.0   Component     Latest Ref Rng & Units 07/13/2020  SARS Coronavirus 2     NEGATIVE POSITIVE (A)   Component     Latest Ref Rng & Units 07/13/2020  WBC     4.0 - 10.5 K/uL 7.0  RBC     3.87 - 5.11 MIL/uL 4.88  Hemoglobin     12.0 - 15.0 g/dL 13.4  HCT     36.0 - 46.0 % 41.8  MCV     80.0 - 100.0 fL 85.7  MCH     26.0 - 34.0 pg 27.5  MCHC     30.0 - 36.0 g/dL 32.1  RDW     11.5 - 15.5 % 16.1 (H)  Platelets     150 - 400 K/uL 251  nRBC     0.0 - 0.2 % 0.0  Neutrophils     % 60  NEUT#     1.7 - 7.7 K/uL 4.2  Lymphocytes     % 17  Lymphocyte #     0.7 - 4.0 K/uL 1.2  Monocytes Relative     % 20  Monocyte #     0.1 - 1.0 K/uL 1.4 (H)  Eosinophil     % 0  Eosinophils Absolute     0.0 - 0.5 K/uL 0.0  Basophil     % 1  Basophils Absolute     0.0 - 0.1 K/uL 0.0  Immature Granulocytes     %  2  Abs Immature Granulocytes     0.00 - 0.07 K/uL 0.12 (H)  Lymphs     Not Estab. %   Monocytes     Not Estab. %   Eos     Not Estab. %   Basos     Not Estab. %    Monocytes Absolute     0.1 - 0.9 x10E3/uL   EOS (ABSOLUTE)     0.0 - 0.4 x10E3/uL   Immature Grans (Abs)     0.0 - 0.1 x10E3/uL     Urinalysis Component     Latest Ref Rng & Units 04/28/2020  Color, Urine     YELLOW YELLOW  Appearance     CLEAR CLEAR  Specific Gravity, Urine     1.005 - 1.030 1.015  pH     5.0 - 8.0 5.5  Glucose, UA     NEGATIVE mg/dL NEGATIVE  Hgb urine dipstick     NEGATIVE NEGATIVE  Bilirubin Urine     NEGATIVE NEGATIVE  Ketones, ur     NEGATIVE mg/dL NEGATIVE  Protein     NEGATIVE mg/dL NEGATIVE  Nitrite     NEGATIVE NEGATIVE  Leukocytes,Ua     NEGATIVE NEGATIVE  Squamous Epithelial / LPF     0 - 5 6-10  WBC, UA     0 - 5 WBC/hpf 0-5  RBC / HPF     0 - 5 RBC/hpf NONE SEEN  Bacteria, UA     NONE SEEN MANY (A)    Pertinent Imaging: Results for orders placed or performed during the hospital encounter of 07/13/20  SARS CORONAVIRUS 2 (TAT 6-24 HRS) Nasopharyngeal Nasopharyngeal Swab   Specimen: Nasopharyngeal Swab  Result Value Ref Range   SARS Coronavirus 2 POSITIVE (A) NEGATIVE  CBC with Differential  Result Value Ref Range   WBC 7.0 4.0 - 10.5 K/uL   RBC 4.88 3.87 - 5.11 MIL/uL   Hemoglobin 13.4 12.0 - 15.0 g/dL   HCT 75.1 02.5 - 85.2 %   MCV 85.7 80.0 - 100.0 fL   MCH 27.5 26.0 - 34.0 pg   MCHC 32.1 30.0 - 36.0 g/dL   RDW 77.8 (H) 24.2 - 35.3 %   Platelets 251 150 - 400 K/uL   nRBC 0.0 0.0 - 0.2 %   Neutrophils Relative % 60 %   Neutro Abs 4.2 1.7 - 7.7 K/uL   Lymphocytes Relative 17 %   Lymphs Abs 1.2 0.7 - 4.0 K/uL   Monocytes Relative 20 %   Monocytes Absolute 1.4 (H) 0.1 - 1.0 K/uL   Eosinophils Relative 0 %   Eosinophils Absolute 0.0 0.0 - 0.5 K/uL   Basophils Relative 1 %   Basophils Absolute 0.0 0.0 - 0.1 K/uL   Immature Granulocytes 2 %   Abs Immature Granulocytes 0.12 (H) 0.00 - 0.07 K/uL   Assessment & Plan:    1. Intermediate risk hematuria Hematuria work up completed in 04/2018 - findings positive for  NODULAR AREA OF CYSTITIS CYSTICA.  NEGATIVE FOR ATYPIA AND MALIGNANCY.   DEEPER SECTIONS EXAMINED.  BLADDER, TRIGONE; BIOPSY:  SQUAMOUS METAPLASIA, NON-KERATINIZING.  CYSTITIS CYSTICA WITH FOCAL CALCIFICATION. NEGATIVE FOR ATYPIA AND MALIGNANCY No gross hematuria. UA is negative for microscopic hematuria. RTC in 1 year with UA.  2. Bilateral angiomyolipomas Will order a renal ultrasound  3. Mixed incontinence Patient would like to delay PTNS until January 2022 due to [facility].  4. Nocturia Sleeping with CPAP machine  No follow-ups on file.  [place] [address] [place], [place] [address] [phone]

## 2020-08-10 NOTE — Progress Notes (Signed)
04/28/2020 1:37 PM   Lori Knight 03-May-1971 [id number]  Referring provider: [name], NP [address] [place],   [id number] Chief Complaint  Patient presents with  . Follow-up    RUS and UA results    HPI: Lori Knight is a 50 y.o. female who returns for a 1 year follow up of nocturia, mixed incontinence, bilateral angiomyolipomas, and history of hematuria.   Intermediate risk hematuria Former smoker.  CTU 03/2018 revealed normal adrenal glands.  No kidney stones identified. No hydronephrosis, hydroureter or ureterolithiasis. Anterior right lower pole angiomyolipoma measures 3.7 cm, image 49/2. Tiny angiomyolipoma within the left kidney measures 6 mm. No suspicious kidney mass. Urinary bladder appears normal. Cystoscopy in 04/2018 with Dr. Erlene Quan noted a small 1 cm round papillary tumor at left dome of bladder highly concerning for bladder cancer.  She underwent a TURBT in 04/2018.  Lesion found not to be malignant.  She denies any gross hematuria.  UA negative for micro heme.    Bilateral angiomyolipomas Found on 03/2018 CTU: 3.7 cm right lower pole angiomyolipoma and 6 mm left kidney angiomyolipoma.  RUS in 09/2018 revealed unchanged bilateral renal angiomyolipomas.  She was having bilateral flank pain.  Pain was mild. RUS from 04/19/2019 noted a 4.0 cm hyperechoic mass mid inferior RIGHT kidney consistent with angiomyolipoma. A 7 mm hyperechoic nodule LEFT kidney consistent with angiomyolipoma.  RUS 07/2020 No significant interval change in the known bilateral renal angiomyolipoma.  No hydronephrosis or other acute finding.  Mixed incontinence Patient has completed 12 sessions of PTNS.  Would like to table the treatments at this time due to the discomfort of the needle.    Nocturia Sleeping with CPAP machine more often now.     PMH: Past Medical History:  Diagnosis Date  . Adult hypothyroidism 06/21/2012  . Bilateral polycystic ovarian syndrome 06/21/2012   . Biliary calculus with cholecystitis 08/14/2012  . Bipolar 1 disorder ([place])   . BP (high blood pressure) 02/14/2014  . Bronchitis   . Diabetes mellitus ([place]) 08/14/2012  . Diabetes mellitus without complication ([place])    pre-diabetic  . Dysrhythmia    wore heart monitor 2016. Corrected by changing Levothyroxine dose.  Marland Kitchen Heart murmur    followed by PCP-AS A CHILD-ASYMPTOMATIC  . Hyperprolactinemia ([place]) 06/21/2012  . Hypertension   . Hypoxia 04/17/2018  . Motion sickness    carnival rides  . PONV (postoperative nausea and vomiting)    WAKES UP CRYING  . Shortness of breath dyspnea    stairs. related to wt.  . Sleep apnea    has CPAP. has not used since 2011  . Vertigo    2x in last yr    Surgical History: Past Surgical History:  Procedure Laterality Date  . ABDOMINAL HYSTERECTOMY  2012   cervical dysplasia/ovaries remian  . CHOLECYSTECTOMY    . COLONOSCOPY WITH PROPOFOL N/A 06/19/2015   Procedure: COLONOSCOPY WITH PROPOFOL;  Surgeon: [name], MD;  Location: [facility];  Service: Endoscopy;  Laterality: N/A;  Diabetic - oral meds   . DENTAL SURGERY    . ETHMOIDECTOMY Bilateral 04/17/2018   Procedure: ETHMOIDECTOMY;  Surgeon: [name], MD;  Location: [facility];  Service: ENT;  Laterality: Bilateral;  . GALLBLADDER SURGERY    . IMAGE GUIDED SINUS SURGERY N/A 04/17/2018   Procedure: IMAGE GUIDED SINUS SURGERY;  Surgeon: [name], MD;  Location: [facility];  Service: ENT;  Laterality: N/A;  . MAXILLARY ANTROSTOMY Bilateral 04/17/2018   Procedure: MAXILLARY  ANTROSTOMY;  Surgeon: [name], MD;  Location: [facility];  Service: ENT;  Laterality: Bilateral;  . NASAL SEPTOPLASTY W/ TURBINOPLASTY Bilateral 04/17/2018   Procedure: NASAL SEPTOPLASTY WITH TURBINATE REDUCTION;  Surgeon: [name], MD;  Location: [facility];  Service: ENT;  Laterality: Bilateral;  . TRANSURETHRAL RESECTION OF BLADDER TUMOR N/A 04/17/2018   Procedure: NTRANSURETHRAL RESECTION OF BLADDER TUMOR  (TURBT) WITH GEMCITABINE;  Surgeon: [name], MD;  Location: [facility];  Service: Urology;  Laterality: N/A;    Home Medications:  Allergies as of 08/11/2020      Reactions   Hctz [hydrochlorothiazide] Cough   Lisinopril Cough   Latex Rash   Condoms only   Prednisone Anxiety   Paranoia      Medication List       Accurate as of August 11, 2020  1:37 PM. If you have any questions, ask your nurse or doctor.        STOP taking these medications   glipiZIDE 5 MG tablet Commonly known as: GLUCOTROL Stopped by: Sophiana Milanese, PA-C   meloxicam 15 MG tablet Commonly known as: MOBIC Stopped by: Zara Council, PA-C     TAKE these medications   albuterol 108 (90 Base) MCG/ACT inhaler Commonly known as: VENTOLIN HFA Inhale 2 puffs into the lungs every 4 (four) hours as needed for wheezing or shortness of breath.   amLODipine 5 MG tablet Commonly known as: NORVASC TAKE 1 TABLET BY MOUTH EVERY DAY   blood glucose meter kit and supplies by XX route as directed   Cholecalciferol 125 MCG (5000 UT) capsule Take 1 capsule (5,000 Units total) by mouth daily.   Contour Next Test test strip Generic drug: glucose blood USE TO CHECK BS UP TO 3 TIMES DAILY FOR DIABETES DX: E11.9   Dulaglutide 1.5 MG/0.5ML Sopn Inject 1.5 mg into the skin once a week.   Fish Oil 1200 MG Caps Take 1,200 mg by mouth daily.   lamoTRIgine 150 MG tablet Commonly known as: LAMICTAL Take 150 mg by mouth 2 (two) times daily. What changed: Another medication with the same name was removed. Continue taking this medication, and follow the directions you see here. Changed by: Zara Council, PA-C   levothyroxine 175 MCG tablet Commonly known as: SYNTHROID TAKE 1 TABLET (175 MCG TOTAL) BY MOUTH DAILY.   losartan 100 MG tablet Commonly known as: COZAAR TAKE 1/2 TABLET (50 MG TOTAL) BY MOUTH 2 (TWO) TIMES DAILY.   metFORMIN 500 MG tablet Commonly known as: GLUCOPHAGE Take 1 tablet (500 mg  total) by mouth 3 (three) times daily.   mometasone 0.1 % cream Commonly known as: ELOCON Apply 1 application topically daily as needed.   multivitamin tablet Take 1 tablet by mouth daily.   omeprazole 40 MG capsule Commonly known as: PRILOSEC Take 1 capsule (40 mg total) by mouth daily.   ondansetron 4 MG disintegrating tablet Commonly known as: Zofran ODT Take 1 tablet (4 mg total) by mouth every 8 (eight) hours as needed for nausea or vomiting.   vitamin C 1000 MG tablet Take 1,000 mg by mouth daily.       Allergies:  Allergies  Allergen Reactions  . Hctz [Hydrochlorothiazide] Cough  . Lisinopril Cough  . Latex Rash    Condoms only  . Prednisone Anxiety    Paranoia    Family History: Family History  Problem Relation Age of Onset  . Diabetes Maternal Grandmother   . Hypertension Maternal Grandmother   . Hyperlipidemia Maternal Grandmother   .  Hypothyroidism Maternal Grandmother   . Heart disease Maternal Grandmother   . Diabetes Mother   . Hypertension Mother   . Hypothyroidism Mother   . Bipolar disorder Mother   . Heart disease Mother   . Alcohol abuse Father   . Depression Daughter   . Breast cancer Neg Hx     Social History:  reports that she quit smoking about 18 years ago. Her smoking use included cigarettes. She has a 10.00 pack-year smoking history. She has never used smokeless tobacco. She reports current alcohol use. She reports that she does not use drugs.   Physical Exam: BP (!) 141/91   Pulse 83   Ht '5\' 6"'  (1.676 m)   Wt 295 lb (133.8 kg)   BMI 47.61 kg/m   Constitutional:  Well nourished. Alert and oriented, No acute distress. HEENT: [place] AT, mask in place.  Trachea midline Cardiovascular: No clubbing, cyanosis, or edema. Respiratory: Normal respiratory effort, no increased work of breathing. Neurologic: Grossly intact, no focal deficits, moving all 4 extremities. Psychiatric: Normal mood and affect.   Laboratory Data: Component      Latest Ref Rng & Units 08/11/2020  Color, Urine     YELLOW YELLOW  Appearance     CLEAR CLEAR  Specific Gravity, Urine     1.005 - 1.030 1.025  pH     5.0 - 8.0 6.5  Glucose, UA     NEGATIVE mg/dL NEGATIVE  Hgb urine dipstick     NEGATIVE NEGATIVE  Bilirubin Urine     NEGATIVE NEGATIVE  Ketones, ur     NEGATIVE mg/dL TRACE (A)  Protein     NEGATIVE mg/dL 100 (A)  Nitrite     NEGATIVE NEGATIVE  Leukocytes,Ua     NEGATIVE NEGATIVE  Squamous Epithelial / LPF     0 - 5 6-10  WBC, UA     0 - 5 WBC/hpf 0-5  RBC / HPF     0 - 5 RBC/hpf NONE SEEN  Bacteria, UA     NONE SEEN MANY (A)  Mucus      PRESENT   Component     Latest Ref Rng & Units 05/07/2020  LH     mIU/mL 22.3  FSH     mIU/mL 22.0   Component     Latest Ref Rng & Units 07/13/2020  SARS Coronavirus 2     NEGATIVE POSITIVE (A)   Component     Latest Ref Rng & Units 07/13/2020  WBC     4.0 - 10.5 K/uL 7.0  RBC     3.87 - 5.11 MIL/uL 4.88  Hemoglobin     12.0 - 15.0 g/dL 13.4  HCT     36.0 - 46.0 % 41.8  MCV     80.0 - 100.0 fL 85.7  MCH     26.0 - 34.0 pg 27.5  MCHC     30.0 - 36.0 g/dL 32.1  RDW     11.5 - 15.5 % 16.1 (H)  Platelets     150 - 400 K/uL 251  nRBC     0.0 - 0.2 % 0.0  Neutrophils     % 60  NEUT#     1.7 - 7.7 K/uL 4.2  Lymphocytes     % 17  Lymphocyte #     0.7 - 4.0 K/uL 1.2  Monocytes Relative     % 20  Monocyte #     0.1 - 1.0 K/uL 1.4 (H)  Eosinophil     % 0  Eosinophils Absolute     0.0 - 0.5 K/uL 0.0  Basophil     % 1  Basophils Absolute     0.0 - 0.1 K/uL 0.0  Immature Granulocytes     % 2  Abs Immature Granulocytes     0.00 - 0.07 K/uL 0.12 (H)  Lymphs     Not Estab. %   Monocytes     Not Estab. %   Eos     Not Estab. %   Basos     Not Estab. %   Monocytes Absolute     0.1 - 0.9 x10E3/uL   EOS (ABSOLUTE)     0.0 - 0.4 x10E3/uL   Immature Grans (Abs)     0.0 - 0.1 x10E3/uL    Specimen:  Blood  Ref Range & Units 6 d ago  Hemoglobin A1C 4.2 -  5.6 % 6.3High   Average Blood Glucose (Calc) mg/dL 134   Resulting [facility] - LAB   Narrative  Normal Range:  4.2 - 5.6%  Increased Risk: 5.7 - 6.4%  Diabetes:    >= 6.5%  Glycemic Control for adults with diabetes: <7%  Specimen Collected: 08/05/20 1:25 PM Last Resulted: 08/05/20 1:37 PM  Received From: [facility]  Result Received: 08/06/20 11:03 AM  I have reviewed the labs.  Pertinent Imaging: CLINICAL DATA:  Follow-up renal angiomyolipoma is.  EXAM: RENAL / URINARY TRACT ULTRASOUND COMPLETE  COMPARISON:  Renal ultrasound April 19, 2019  FINDINGS: Right Kidney:  Renal measurements: 12.0 x 6.2 x 5.7 cm = volume: 220 mL. Echogenicity within normal limits. No hydronephrosis. Known angiomyolipoma in the upper interpolar region measures 3.9 x 3.6 x 3.0 cm previously 4.0 x 3.7 x 3.2 cm.  Left Kidney:  Renal measurements: 12.4 x 6.5 x 5.5 cm = volume: 234 mL. Echogenicity within normal limits. No hydronephrosis. Small angiolipoma in the lower interpolar region measures 8 x 8 x 7 mm previously 7 x 7 x 6 mm.  Bladder:  Appears normal for degree of bladder distention.  Other:  None.  IMPRESSION: 1. No significant interval change in the known bilateral renal angiomyolipoma. 2. No hydronephrosis or other acute finding.   Electronically Signed   By: Dahlia Bailiff MD   On: 07/31/2020 14:21 I have independently reviewed the films.  See HPI.   Assessment & Plan:    1. Intermediate risk hematuria Hematuria work up completed in 04/2018 - findings positive for NODULAR AREA OF CYSTITIS CYSTICA.  NEGATIVE FOR ATYPIA AND MALIGNANCY.   DEEPER SECTIONS EXAMINED.  BLADDER, TRIGONE; BIOPSY:  SQUAMOUS METAPLASIA, NON-KERATINIZING.  CYSTITIS CYSTICA WITH FOCAL CALCIFICATION. NEGATIVE FOR ATYPIA AND MALIGNANCY No gross hematuria. UA is negative for microscopic hematuria. RTC in 1 year with UA.  2. Bilateral  angiomyolipomas Unchanged RUS in one year   3. Mixed incontinence Would like to table the treatments at this time.    4. Nocturia Sleeping with CPAP machine  Return in about 1 year (around 08/11/2021) for RUS report and UA .  [facility] [address] [name], [place] [address] [phone]

## 2020-08-11 ENCOUNTER — Other Ambulatory Visit: Payer: Self-pay | Admitting: *Deleted

## 2020-08-11 ENCOUNTER — Ambulatory Visit (INDEPENDENT_AMBULATORY_CARE_PROVIDER_SITE_OTHER): Payer: 59 | Admitting: Urology

## 2020-08-11 ENCOUNTER — Other Ambulatory Visit: Payer: Self-pay

## 2020-08-11 ENCOUNTER — Encounter: Payer: Self-pay | Admitting: Urology

## 2020-08-11 ENCOUNTER — Other Ambulatory Visit
Admission: RE | Admit: 2020-08-11 | Discharge: 2020-08-11 | Disposition: A | Discharge status: Home / Self Care | Payer: 59 | Attending: Urology | Admitting: Urology

## 2020-08-11 VITALS — BP 141/91 | HR 83 | Ht 66.0 in | Wt 295.0 lb

## 2020-08-11 DIAGNOSIS — R319 Hematuria, unspecified: Secondary | ICD-10-CM

## 2020-08-11 DIAGNOSIS — N3946 Mixed incontinence: Secondary | ICD-10-CM | POA: Diagnosis not present

## 2020-08-11 DIAGNOSIS — D1771 Benign lipomatous neoplasm of kidney: Secondary | ICD-10-CM | POA: Diagnosis not present

## 2020-08-11 DIAGNOSIS — R351 Nocturia: Secondary | ICD-10-CM | POA: Diagnosis not present

## 2020-08-11 LAB — URINALYSIS, COMPLETE (UACMP) WITH MICROSCOPIC
Bilirubin Urine: NEGATIVE
Glucose, UA: NEGATIVE mg/dL
Hgb urine dipstick: NEGATIVE
Leukocytes,Ua: NEGATIVE
Nitrite: NEGATIVE
Protein, ur: 100 mg/dL — AB
RBC / HPF: NONE SEEN RBC/hpf (ref 0–5)
Specific Gravity, Urine: 1.025 (ref 1.005–1.030)
pH: 6.5 (ref 5.0–8.0)

## 2020-08-12 ENCOUNTER — Telehealth: Payer: Self-pay | Admitting: Urology

## 2020-08-12 ENCOUNTER — Other Ambulatory Visit: Payer: Self-pay | Admitting: Urology

## 2020-08-12 DIAGNOSIS — D1771 Benign lipomatous neoplasm of kidney: Secondary | ICD-10-CM

## 2020-08-12 NOTE — Telephone Encounter (Signed)
Would you call her and schedule her for next year for RUS report and UA?

## 2020-08-12 NOTE — Progress Notes (Signed)
Orders in for RUS.  

## 2020-08-20 ENCOUNTER — Ambulatory Visit
Admission: RE | Admit: 2020-08-20 | Discharge: 2020-08-20 | Disposition: A | Discharge status: Home / Self Care | Payer: 59 | Source: Ambulatory Visit | Attending: Family Medicine | Admitting: Family Medicine

## 2020-08-20 ENCOUNTER — Other Ambulatory Visit: Payer: Self-pay

## 2020-08-20 ENCOUNTER — Ambulatory Visit: Payer: 59

## 2020-08-20 ENCOUNTER — Ambulatory Visit (INDEPENDENT_AMBULATORY_CARE_PROVIDER_SITE_OTHER): Payer: 59

## 2020-08-20 VITALS — BP 121/86 | HR 101 | Temp 98.5°F | Resp 18 | Ht 66.0 in | Wt 300.0 lb

## 2020-08-20 DIAGNOSIS — R059 Cough, unspecified: Secondary | ICD-10-CM

## 2020-08-20 DIAGNOSIS — R0602 Shortness of breath: Secondary | ICD-10-CM | POA: Diagnosis not present

## 2020-08-20 DIAGNOSIS — J069 Acute upper respiratory infection, unspecified: Secondary | ICD-10-CM

## 2020-08-20 MED ORDER — HYDROCOD POLST-CPM POLST ER 10-8 MG/5ML PO SUER: 5.0000 mL | Freq: Every evening | ORAL | 0 refills | Status: DC | PRN

## 2020-08-20 MED ORDER — BENZONATATE 200 MG PO CAPS: 200.0000 mg | ORAL_CAPSULE | Freq: Three times a day (TID) | ORAL | 0 refills | Status: DC | PRN

## 2020-08-20 NOTE — ED Provider Notes (Signed)
[facility]    CSN: [id number] Arrival date & time: 08/20/20  1458      History   Chief Complaint Chief Complaint  Patient presents with  . Cough  . Nasal Congestion  . Diarrhea   HPI  50 year old female presents with the above complaint.  Patient reports she has had symptoms over the past several days.  She reports congestion, sneezing and also cough.  The congestion and sneezing have improved.  However she continues to be bothered by the cough.  She also reports diarrhea over the past 2 to 3 days.  She has recently had COVID.  She states that she is concerned about possible pneumonia.  Her husband had Covid pneumonia.  No fever.  Cough is not particular productive.  No relieving factors.  No other complaints.   Past Medical History:  Diagnosis Date  . Adult hypothyroidism 06/21/2012  . Bilateral polycystic ovarian syndrome 06/21/2012  . Biliary calculus with cholecystitis 08/14/2012  . Bipolar 1 disorder ([place])   . BP (high blood pressure) 02/14/2014  . Bronchitis   . COVID-19 07/2020  . Diabetes mellitus ([place]) 08/14/2012  . Diabetes mellitus without complication ([place])    pre-diabetic  . Dysrhythmia    wore heart monitor 2016. Corrected by changing Levothyroxine dose.  Marland Kitchen Heart murmur    followed by PCP-AS A CHILD-ASYMPTOMATIC  . Hyperprolactinemia ([place]) 06/21/2012  . Hypertension   . Hypoxia 04/17/2018  . Motion sickness    carnival rides  . PONV (postoperative nausea and vomiting)    WAKES UP CRYING  . Shortness of breath dyspnea    stairs. related to wt.  . Sleep apnea    has CPAP. has not used since 2011  . Vertigo    2x in last yr    Patient Active Problem List   Diagnosis Date Noted  . Type 2 diabetes mellitus with proteinuria ([place]) 02/20/2020  . Right elbow pain 02/20/2020  . Meningioma ([place]) 12/24/2019  . Hyperlipidemia associated with type 2 diabetes mellitus ([place]) 08/13/2019  . Gastroesophageal reflux disease without esophagitis 06/20/2019   . Elevated liver function tests 12/26/2018  . Elevated ferritin 12/20/2018  . Cough in adult patient 12/26/2017  . Spasm of muscle of lower back 05/11/2017  . Fibroma of foot 03/03/2016  . Neuropathic pain of finger 03/03/2016  . Type 2 diabetes mellitus with morbid obesity ([place]) 12/01/2015  . OSA on CPAP 09/22/2015  . Insomnia 08/28/2015  . Internal hemorrhoids 08/28/2015  . Vitamin D deficiency 08/28/2015  . Obesity, Class III, BMI 40-49.9 (morbid obesity) ([place]) 08/27/2015  . Hypertension associated with diabetes ([place]) 02/14/2014  . Benign paroxysmal positional nystagmus 02/14/2014  . 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 Bipolar I disorder, single manic episode ([place]) 08/14/2012  . Adult hypothyroidism 06/21/2012  . Bilateral polycystic ovarian syndrome 06/21/2012    Past Surgical History:  Procedure Laterality Date  . ABDOMINAL HYSTERECTOMY  2012   cervical dysplasia/ovaries remian  . CHOLECYSTECTOMY    . COLONOSCOPY WITH PROPOFOL N/A 06/19/2015   Procedure: COLONOSCOPY WITH PROPOFOL;  Surgeon: [name], MD;  Location: [facility];  Service: Endoscopy;  Laterality: N/A;  Diabetic - oral meds   . DENTAL SURGERY    . ETHMOIDECTOMY Bilateral 04/17/2018   Procedure: ETHMOIDECTOMY;  Surgeon: [name], MD;  Location: [facility];  Service: ENT;  Laterality: Bilateral;  . GALLBLADDER SURGERY    . IMAGE GUIDED SINUS SURGERY N/A 04/17/2018   Procedure: IMAGE GUIDED SINUS SURGERY;  Surgeon:  [name], MD;  Location: [facility];  Service: ENT;  Laterality: N/A;  . MAXILLARY ANTROSTOMY Bilateral 04/17/2018   Procedure: MAXILLARY ANTROSTOMY;  Surgeon: [name], MD;  Location: [facility];  Service: ENT;  Laterality: Bilateral;  . NASAL SEPTOPLASTY W/ TURBINOPLASTY Bilateral 04/17/2018   Procedure: NASAL SEPTOPLASTY WITH TURBINATE REDUCTION;  Surgeon: [name], MD;  Location: [facility];  Service: ENT;  Laterality: Bilateral;  . TRANSURETHRAL RESECTION OF BLADDER TUMOR  N/A 04/17/2018   Procedure: NTRANSURETHRAL RESECTION OF BLADDER TUMOR (TURBT) WITH GEMCITABINE;  Surgeon: [name], MD;  Location: [facility];  Service: Urology;  Laterality: N/A;    OB History   No obstetric history on file.      Home Medications    Prior to Admission medications   Medication Sig Start Date End Date Taking? Authorizing Provider  albuterol (VENTOLIN HFA) 108 (90 Base) MCG/ACT inhaler Inhale 2 puffs into the lungs every 4 (four) hours as needed for wheezing or shortness of breath. 08/11/20  Yes [provider]  amLODipine (NORVASC) 5 MG tablet TAKE 1 TABLET BY MOUTH EVERY DAY 06/06/20  Yes Cannady, Jolene T, NP  Ascorbic Acid (VITAMIN C) 1000 MG tablet Take 1,000 mg by mouth daily.   Yes [provider]  benzonatate (TESSALON) 200 MG capsule Take 1 capsule (200 mg total) by mouth 3 (three) times daily as needed for cough. 08/20/20  Yes Thersa Salt G, DO  blood glucose meter kit and supplies by XX route as directed 09/04/19 09/03/20 Yes [provider]  chlorpheniramine-HYDROcodone (TUSSIONEX PENNKINETIC ER) 10-8 MG/5ML SUER Take 5 mLs by mouth at bedtime as needed. 08/20/20  Yes Coral Spikes, DO  Cholecalciferol 5000 units capsule Take 1 capsule (5,000 Units total) by mouth daily. 11/28/15  Yes Plonk, Gwyndolyn Saxon, MD  CONTOUR NEXT TEST test strip USE TO CHECK BS UP TO 3 TIMES DAILY FOR DIABETES DX: E11.9 06/13/19  Yes [name], MD  Dulaglutide 1.5 MG/0.5ML SOPN Inject 1.5 mg into the skin once a week. 08/28/18  Yes [provider]  lamoTRIgine (LAMICTAL) 150 MG tablet Take 150 mg by mouth 2 (two) times daily. 07/03/20  Yes [provider]  levothyroxine (SYNTHROID, LEVOTHROID) 175 MCG tablet TAKE 1 TABLET (175 MCG TOTAL) BY MOUTH DAILY. 09/05/17  Yes [name], MD  losartan (COZAAR) 100 MG tablet TAKE 1/2 TABLET (50 MG TOTAL) BY MOUTH 2 (TWO) TIMES DAILY. 07/03/20  Yes Cannady, Jolene T, NP  metFORMIN (GLUCOPHAGE) 500 MG  tablet Take 1 tablet (500 mg total) by mouth 3 (three) times daily. 07/10/18  Yes [name], MD  mometasone (ELOCON) 0.1 % cream Apply 1 application topically daily as needed.  11/30/19  Yes [provider]  Multiple Vitamin (MULTIVITAMIN) tablet Take 1 tablet by mouth daily.   Yes [provider]  Omega-3 Fatty Acids (FISH OIL) 1200 MG CAPS Take 1,200 mg by mouth daily.    Yes [provider]  omeprazole (PRILOSEC) 40 MG capsule Take 1 capsule (40 mg total) by mouth daily. 04/23/20  Yes Cannady, Jolene T, NP  ondansetron (ZOFRAN ODT) 4 MG disintegrating tablet Take 1 tablet (4 mg total) by mouth every 8 (eight) hours as needed for nausea or vomiting. 07/09/19  Yes Lamptey, Myrene Galas, MD    Family History Family History  Problem Relation Age of Onset  . Diabetes Maternal Grandmother   . Hypertension Maternal Grandmother   . Hyperlipidemia Maternal Grandmother   . Hypothyroidism Maternal Grandmother   .  Heart disease Maternal Grandmother   . Diabetes Mother   . Hypertension Mother   . Hypothyroidism Mother   . Bipolar disorder Mother   . Heart disease Mother   . Alcohol abuse Father   . Depression Daughter   . Breast cancer Neg Hx     Social History Social History   Tobacco Use  . Smoking status: Former Smoker    Packs/day: 1.00    Years: 10.00    Pack years: 10.00    Types: Cigarettes    Quit date: 07/12/2002    Years since quitting: 18.1  . Smokeless tobacco: Never Used  . Tobacco comment: quit 2004  Vaping Use  . Vaping Use: Never used  Substance Use Topics  . Alcohol use: Yes    Alcohol/week: 0.0 standard drinks    Comment: rarely  . Drug use: No     Allergies   Hctz [hydrochlorothiazide], Lisinopril, Latex, and Prednisone   Review of Systems Review of Systems Per HPI  Physical Exam Triage Vital Signs ED Triage Vitals  Enc Vitals Group     BP 08/20/20 1512 121/86     Pulse Rate 08/20/20 1512 (!) 101     Resp 08/20/20  1512 18     Temp 08/20/20 1510 98.5 F (36.9 C)     Temp Source 08/20/20 1510 Oral     SpO2 08/20/20 1512 97 %     Weight 08/20/20 1508 300 lb (136.1 kg)     Height 08/20/20 1508 _0  (1.676 m)     Head Circumference --      Peak Flow --      Pain Score 08/20/20 1508 0     Pain Loc --      Pain Edu? --      Excl. in [place]? --    No data found.  Updated Vital Signs BP 121/86 (BP Location: Left Arm)   Pulse (!) 101   Temp 98.5 F (36.9 C) (Oral)   Resp 18   Ht _1  (1.676 m)   Wt 136.1 kg   SpO2 97%   BMI 48.42 kg/m   Visual Acuity Right Eye Distance:   Left Eye Distance:   Bilateral Distance:    Right Eye Near:   Left Eye Near:    Bilateral Near:     Physical Exam Vitals and nursing note reviewed.  Constitutional:      General: She is not in acute distress.    Appearance: Normal appearance. She is obese. She is not ill-appearing.  HENT:     Head: Normocephalic and atraumatic.  Cardiovascular:     Rate and Rhythm: Normal rate and regular rhythm.     Heart sounds: No murmur heard.   Pulmonary:     Effort: Pulmonary effort is normal.     Breath sounds: Normal breath sounds. No wheezing, rhonchi or rales.  Neurological:     Mental Status: She is alert.  Psychiatric:        Mood and Affect: Mood normal.        Behavior: Behavior normal.    UC Treatments / Results  Labs (all labs ordered are listed, but only abnormal results are displayed) Labs Reviewed - No data to display  EKG   Radiology DG Chest 2 View  Result Date: 08/20/2020 CLINICAL DATA:  Cough, shortness of breath. EXAM: CHEST - 2 VIEW COMPARISON:  August 04, 2019. FINDINGS: The heart size and mediastinal contours are within normal limits. Both lungs are  clear. No pneumothorax or pleural effusion is noted. The visualized skeletal structures are unremarkable. IMPRESSION: No active cardiopulmonary disease. Electronically Signed   By: Marijo Conception M.D.   On: 08/20/2020 15:32     Procedures Procedures (including critical care time)  Medications Ordered in UC Medications - No data to display  Initial Impression / Assessment and Plan / UC Course  I have reviewed the triage vital signs and the nursing notes.  Pertinent labs & imaging results that were available during my care of the patient were reviewed by me and considered in my medical decision making (see chart for details).    50 year old female presents with a viral respiratory illness.  Chest x-ray was obtained and was independent reviewed by me.  Interpretation: Normal chest x-ray.  Advise supportive care.  Medications were sent regarding cough but patient declined.  She states that she has Best boy at home and will use those.  Final Clinical Impressions(s) / UC Diagnoses   Final diagnoses:  Viral URI with cough     Discharge Instructions     Medication as prescribed.  Use the albuterol as needed.  Take care  Dr. Lacinda Axon    ED Prescriptions    Medication Sig Dispense Auth. Provider   benzonatate (TESSALON) 200 MG capsule Take 1 capsule (200 mg total) by mouth 3 (three) times daily as needed for cough. 30 capsule [place], Sterling G, DO   chlorpheniramine-HYDROcodone (TUSSIONEX PENNKINETIC ER) 10-8 MG/5ML SUER Take 5 mLs by mouth at bedtime as needed. 60 mL Coral Spikes, DO     PDMP not reviewed this encounter.   Coral Spikes, [place] 08/20/20 2028

## 2020-08-20 NOTE — Discharge Instructions (Signed)
Medication as prescribed.  Use the albuterol as needed.  Take care  Dr. Lacinda Axon

## 2020-08-20 NOTE — ED Triage Notes (Signed)
Patient c/o nasal congestion, sneezing and cough that started 1 week ago. She also reports diarrhea x 2 -3 days. She is requesting a chest XRAY.

## 2020-08-25 ENCOUNTER — Ambulatory Visit: Admission: RE | Admit: 2020-08-25 | Payer: 59 | Source: Ambulatory Visit

## 2020-09-08 ENCOUNTER — Other Ambulatory Visit: Payer: Self-pay

## 2020-09-08 ENCOUNTER — Ambulatory Visit
Admission: RE | Admit: 2020-09-08 | Discharge: 2020-09-08 | Disposition: A | Discharge status: Home / Self Care | Payer: 59 | Source: Ambulatory Visit | Attending: Neurosurgery | Admitting: Neurosurgery

## 2020-09-08 DIAGNOSIS — D329 Benign neoplasm of meninges, unspecified: Secondary | ICD-10-CM

## 2020-09-08 MED ORDER — GADOBUTROL 1 MMOL/ML IV SOLN
10.0000 mL | Freq: Once | INTRAVENOUS | Status: AC | PRN
  Administered 2020-09-08: 10 mL via INTRAVENOUS

## 2020-09-11 ENCOUNTER — Other Ambulatory Visit: Payer: Self-pay

## 2020-09-11 ENCOUNTER — Ambulatory Visit (INDEPENDENT_AMBULATORY_CARE_PROVIDER_SITE_OTHER): Payer: 59 | Admitting: Bariatrics

## 2020-09-11 ENCOUNTER — Encounter (INDEPENDENT_AMBULATORY_CARE_PROVIDER_SITE_OTHER): Payer: Self-pay | Admitting: Bariatrics

## 2020-09-11 VITALS — BP 146/88 | HR 79 | Temp 97.9°F | Ht 67.0 in | Wt 289.0 lb

## 2020-09-11 DIAGNOSIS — Z9189 Other specified personal risk factors, not elsewhere classified: Secondary | ICD-10-CM

## 2020-09-11 DIAGNOSIS — M545 Low back pain, unspecified: Secondary | ICD-10-CM

## 2020-09-11 DIAGNOSIS — Z6841 Body Mass Index (BMI) 40.0 and over, adult: Secondary | ICD-10-CM

## 2020-09-11 DIAGNOSIS — E1159 Type 2 diabetes mellitus with other circulatory complications: Secondary | ICD-10-CM

## 2020-09-11 DIAGNOSIS — E785 Hyperlipidemia, unspecified: Secondary | ICD-10-CM

## 2020-09-11 DIAGNOSIS — R0602 Shortness of breath: Secondary | ICD-10-CM

## 2020-09-11 DIAGNOSIS — I152 Hypertension secondary to endocrine disorders: Secondary | ICD-10-CM

## 2020-09-11 DIAGNOSIS — F3289 Other specified depressive episodes: Secondary | ICD-10-CM

## 2020-09-11 DIAGNOSIS — G4733 Obstructive sleep apnea (adult) (pediatric): Secondary | ICD-10-CM

## 2020-09-11 DIAGNOSIS — Z1331 Encounter for screening for depression: Secondary | ICD-10-CM | POA: Diagnosis not present

## 2020-09-11 DIAGNOSIS — E1169 Type 2 diabetes mellitus with other specified complication: Secondary | ICD-10-CM

## 2020-09-11 DIAGNOSIS — E559 Vitamin D deficiency, unspecified: Secondary | ICD-10-CM | POA: Diagnosis not present

## 2020-09-11 DIAGNOSIS — E039 Hypothyroidism, unspecified: Secondary | ICD-10-CM

## 2020-09-11 DIAGNOSIS — R5383 Other fatigue: Secondary | ICD-10-CM | POA: Diagnosis not present

## 2020-09-11 DIAGNOSIS — G8929 Other chronic pain: Secondary | ICD-10-CM

## 2020-09-11 DIAGNOSIS — Z9989 Dependence on other enabling machines and devices: Secondary | ICD-10-CM

## 2020-09-11 DIAGNOSIS — Z0289 Encounter for other administrative examinations: Secondary | ICD-10-CM

## 2020-09-11 DIAGNOSIS — E1129 Type 2 diabetes mellitus with other diabetic kidney complication: Secondary | ICD-10-CM

## 2020-09-11 DIAGNOSIS — R809 Proteinuria, unspecified: Secondary | ICD-10-CM
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
## 2020-09-12 LAB — COMPREHENSIVE METABOLIC PANEL
ALT: 33 IU/L — ABNORMAL HIGH (ref 0–32)
AST: 20 IU/L (ref 0–40)
Albumin/Globulin Ratio: 1.4 (ref 1.2–2.2)
Albumin: 4.2 g/dL (ref 3.8–4.9)
Alkaline Phosphatase: 122 IU/L — ABNORMAL HIGH (ref 44–121)
BUN/Creatinine Ratio: 16 (ref 9–23)
BUN: 12 mg/dL (ref 6–24)
Bilirubin Total: 0.2 mg/dL (ref 0.0–1.2)
CO2: 24 mmol/L (ref 20–29)
Calcium: 9.5 mg/dL (ref 8.7–10.2)
Chloride: 100 mmol/L (ref 96–106)
Creatinine, Ser: 0.74 mg/dL (ref 0.57–1.00)
Globulin, Total: 2.9 g/dL (ref 1.5–4.5)
Glucose: 122 mg/dL — ABNORMAL HIGH (ref 65–99)
Potassium: 4.5 mmol/L (ref 3.5–5.2)
Sodium: 140 mmol/L (ref 134–144)
Total Protein: 7.1 g/dL (ref 6.0–8.5)
eGFR: 98 mL/min/{1.73_m2} (ref >59)

## 2020-09-12 LAB — LIPID PANEL WITH LDL/HDL RATIO
Cholesterol, Total: 237 mg/dL — ABNORMAL HIGH (ref 100–199)
HDL: 43 mg/dL (ref >39)
LDL Chol Calc (NIH): 147 mg/dL — ABNORMAL HIGH (ref 0–99)
LDL/HDL Ratio: 3.4 ratio — ABNORMAL HIGH (ref 0.0–3.2)
Triglycerides: 255 mg/dL — ABNORMAL HIGH (ref 0–149)
VLDL Cholesterol Cal: 47 mg/dL — ABNORMAL HIGH (ref 5–40)

## 2020-09-12 LAB — T4, FREE: Free T4: 1.49 ng/dL (ref 0.82–1.77)

## 2020-09-12 LAB — T3: T3, Total: 92 ng/dL (ref 71–180)

## 2020-09-12 LAB — VITAMIN D 25 HYDROXY (VIT D DEFICIENCY, FRACTURES): Vit D, 25-Hydroxy: 51 ng/mL (ref 30.0–100.0)

## 2020-09-12 LAB — TSH: TSH: 0.396 u[IU]/mL — ABNORMAL LOW (ref 0.450–4.500)

## 2020-09-12 LAB — INSULIN, RANDOM: INSULIN: 28.7 u[IU]/mL — ABNORMAL HIGH (ref 2.6–24.9)

## 2020-09-15 ENCOUNTER — Encounter (INDEPENDENT_AMBULATORY_CARE_PROVIDER_SITE_OTHER): Payer: Self-pay | Admitting: Bariatrics

## 2020-09-15 NOTE — Progress Notes (Signed)
Dear Lori Guarneri, NP,   Thank you for referring Lori Knight to our clinic. The following note includes my evaluation and treatment recommendations.  Chief Complaint:   OBESITY Lori Knight (MR# [id number]) is a 50 y.o. female who presents for evaluation and treatment of obesity and related comorbidities. Current BMI is Body mass index is 45.26 kg/m. Lori Knight has been struggling with her weight for many years and has been unsuccessful in either losing weight, maintaining weight loss, or reaching her healthy weight goal.  Lori Knight is currently in the action stage of change and ready to dedicate time achieving and maintaining a healthier weight. Lori Knight is interested in becoming our patient and working on intensive lifestyle modifications including (but not limited to) diet and exercise for weight loss.  Lori Knight does not necessarily like to cook. She craves sweets but has more cravings at night. She considers herself a "picky" eater.  Lori Knight's habits were reviewed today and are as follows: Her family eats meals together, she thinks her family will eat healthier with her, her desired weight loss is 109 lbs, she has been heavy most of her life, she started gaining weight at age 30, her heaviest weight ever was 300 pounds, she is a picky eater and doesn't like to eat healthier foods, she has significant food cravings issues, she snacks frequently in the evenings, she skips meals frequently, she is frequently drinking liquids with calories, she frequently makes poor food choices, she has problems with excessive hunger, she frequently eats larger portions than normal, she has binge eating behaviors and she struggles with emotional eating.  Depression Screen Lori Knight (modified PHQ-9) score was 24.  Depression screen Lori Knight 2/9 09/11/2020  Decreased Interest 3  Down, Depressed, Hopeless 3  PHQ - 2 Score 6  Altered sleeping 3  Tired, decreased energy 3  Change in appetite 3  Feeling bad  or failure about yourself  3  Trouble concentrating 3  Moving slowly or fidgety/restless 3  Suicidal thoughts 0  PHQ-9 Score 24  Difficult doing work/chores -  Some recent data might be hidden   Subjective:   1. Other fatigue Lori Knight admits to daytime somnolence and admits to waking up still tired. Patent has a history of symptoms of daytime fatigue. Lori Knight generally gets 6-8 hours of sleep per night, and states that she has poor sleep quality. Snoring is present. Apneic episodes are present. Epworth Sleepiness Score is 22.  2. SOB (shortness of breath) on exertion Lori Knight notes increasing shortness of breath with exercising and seems to be worsening over time with weight gain. She notes getting out of breath sooner with activity than she used to. This has gotten worse recently. Lori Knight denies shortness of breath at rest or orthopnea.  3. Type 2 diabetes mellitus with proteinuria (HCC) Lori Knight is taking Metformin and Trulicity (1.5). Her last A1c was 6.3.  4. Hyperlipidemia associated with type 2 diabetes mellitus (Lori Knight) Lori Knight is taking fish oil. Her last total cholesterol was 235, triglycerides 418, and HDL 36.8.   5. Adult hypothyroidism Lori Knight is taking Synthroid. She is controlled.  6. Hypertension associated with diabetes (Lori Knight) Lori Knight hypertension is controlled.  7. Vitamin D deficiency Lori Knight is taking OTC Vit D 5,000 IU supplementation.  8. Chronic bilateral low back pain without sciatica Lori Knight fell last year and has low back pain as a result. She is taking Tylenol.   9. OSA on CPAP Lori Knight wears a CPAP.  10. Other depression Lori Knight is struggling with  emotional eating and using food for comfort to the extent that it is negatively impacting her health. She has been working on behavior modification techniques to help reduce her emotional eating. She shows no sign of suicidal or homicidal ideations.  11. At risk for activity intolerance Lori Knight is at risk for exercise intolerance due to  obesity.  Assessment/Plan:   1. Other fatigue Lori Knight does feel that her weight is causing her energy to be lower than it should be. Fatigue may be related to obesity, depression or many other causes. Labs will be ordered, and in the meanwhile, Lori Knight will focus on self care including making healthy food choices, increasing physical activity and focusing on stress reduction. Check labs today.  - EKG 12-Lead - Comprehensive metabolic panel - Insulin, random - Lipid Panel With LDL/HDL Ratio - T3 - T4, free - TSH - VITAMIN D 25 Hydroxy (Vit-D Deficiency, Fractures)  2. SOB (shortness of breath) on exertion Lori Knight does feel that she gets out of breath more easily that she used to when she exercises. Lori Knight's shortness of breath appears to be obesity related and exercise induced. She has agreed to work on weight loss and gradually increase exercise to treat her exercise induced shortness of breath. Will continue to monitor closely.  3. Type 2 diabetes mellitus with proteinuria (HCC) [name], neuropathy, limb loss, blindness, coronary artery disease, and death. Intensive lifestyle modification including diet, [name]  - Comprehensive metabolic panel - Insulin, random  4. Hyperlipidemia associated with type 2 diabetes mellitus (Lori Knight) Cardiovascular risk and specific lipid/LDL goals reviewed.  We discussed several lifestyle modifications today and Lori Knight will continue to work on diet, exercise and weight loss efforts. Orders and follow up as documented in patient record. Check labs today.  Counseling Intensive lifestyle modifications are the first line treatment for this issue. . Dietary changes: Increase soluble fiber. Decrease simple carbohydrates. . Exercise changes: Moderate to vigorous-intensity aerobic activity  150 minutes per week if tolerated. . Lipid-lowering medications: see documented in medical record.  - Lipid Panel With LDL/HDL Ratio  5. Adult hypothyroidism Patient with long-standing hypothyroidism, on levothyroxine therapy. She appears euthyroid. Orders and follow up as documented in patient record.  Counseling . Good thyroid control is important for overall health. Supratherapeutic thyroid levels are dangerous and will not improve weight loss results. . The correct way to take levothyroxine is fasting, with water, separated by at least 30 minutes from breakfast, and separated by more than 4 hours from calcium, iron, multivitamins, acid reflux medications (PPIs).    6. Hypertension associated with diabetes ([place]) Lori Knight is working on healthy weight loss and exercise to improve blood pressure control. We will watch for signs of hypotension as she continues her lifestyle modifications. Continue current treatment plan.  7. Vitamin D deficiency Low Vitamin D level contributes to fatigue and are associated with obesity, breast, and colon cancer. She agrees to continue to take OTC Vitamin D @5 ,000 IU daily and will follow-up for routine testing of Vitamin D, at least 2-3 times per year to avoid over-replacement. Check labs today.  - VITAMIN D 25 Hydroxy (Vit-D Deficiency, Fractures)  8. Chronic bilateral low back pain without sciatica No pounding exercises. Gradually increase activity.  9. OSA on CPAP Intensive lifestyle modifications are the first line treatment for this issue. We discussed several lifestyle modifications  today and she will continue to work on diet, exercise and weight loss efforts. We will continue to monitor. Orders and follow up as documented in patient record. Continue wearing CPAP nightly.  10. Other depression Behavior modification techniques were discussed today to help Pessy deal with her emotional/non-hunger eating behaviors.  Orders and follow up as documented in  patient record. Refer to Dr. Mallie Mussel.  11. At risk for activity intolerance Lori Knight was given approximately 15 minutes of exercise intolerance counseling today. She is 50 y.o. female and has risk factors exercise intolerance including obesity. We discussed intensive lifestyle modifications today with an emphasis on specific weight loss instructions and strategies. Lori Knight will slowly increase activity as tolerated.  Repetitive spaced learning was employed today to elicit superior memory formation and behavioral change.  12. Class 3 severe obesity due to excess calories with serious comorbidity and body mass index (BMI) of 45.0 to 49.9 in adult [facility]) Lori Knight is currently in the action stage of change and her goal is to continue with weight loss efforts. I recommend Lori Knight begin the structured treatment plan as follows:  Reviewed 07/13/2020 labs with pt. Work on portion sizes.  She has agreed to the Category 3 Plan.  Exercise goals: No exercise has been prescribed at this time.   Behavioral modification strategies: increasing lean protein intake, decreasing simple carbohydrates, increasing vegetables, increasing water intake, decreasing eating out, no skipping meals, meal planning and cooking strategies, keeping healthy foods in the home and planning for success.  She was informed of the importance of frequent follow-up visits to maximize her success with intensive lifestyle modifications for her multiple health conditions. She was informed we would discuss her lab results at her next visit unless there is a critical issue that needs to be addressed sooner. Lori Knight agreed to keep her next visit at the agreed upon time to discuss these results.  Objective:   Blood pressure (!) 146/88, pulse 79, temperature 97.9 F (36.6 C), height 5\' 7"  (1.702 m), weight 289 lb (131.1 kg), SpO2 97 %. Body mass index is 45.26 kg/m.  EKG: Normal sinus rhythm, rate 78.  Indirect Calorimeter completed today shows a VO2 of  319 and a REE of 2219.  Her calculated basal metabolic rate is 4174 thus her basal metabolic rate is better than expected.  General: Cooperative, alert, well developed, in no acute distress. HEENT: Conjunctivae and lids unremarkable. Cardiovascular: Regular rhythm.  Lungs: Normal work of breathing. Neurologic: No focal deficits.   Lab Results  Component Value Date   CREATININE 0.74 09/11/2020   BUN 12 09/11/2020   NA 140 09/11/2020   K 4.5 09/11/2020   CL 100 09/11/2020   CO2 24 09/11/2020   Lab Results  Component Value Date   ALT 33 (H) 09/11/2020   AST 20 09/11/2020   ALKPHOS 122 (H) 09/11/2020   BILITOT 0.2 09/11/2020   Lab Results  Component Value Date   HGBA1C 6.7% 03/19/2020   HGBA1C 7.8 05/31/2019   HGBA1C 7.8 05/31/2019   HGBA1C 8.0 07/17/2018   HGBA1C 8.5 (H) 04/17/2018   Lab Results  Component Value Date   INSULIN 28.7 (H) 09/11/2020   Lab Results  Component Value Date   TSH 0.396 (L) 09/11/2020   Lab Results  Component Value Date   CHOL 237 (H) 09/11/2020   HDL 43 09/11/2020   LDLCALC 147 (H) 09/11/2020   TRIG 255 (H) 09/11/2020   Lab Results  Component Value Date   WBC 7.0 07/13/2020  HGB 13.4 07/13/2020   HCT 41.8 07/13/2020   MCV 85.7 07/13/2020   PLT 251 07/13/2020   Lab Results  Component Value Date   IRON 57 12/20/2018   TIBC 388 12/20/2018   FERRITIN 122 12/20/2018    Attestation Statements:   Reviewed by clinician on day of visit: allergies, medications, problem list, medical history, surgical history, family history, social history, and previous encounter notes.  Coral Ceo, am acting as Location manager for [employer], DO.  I have reviewed the above documentation for accuracy and completeness, and I agree with the above. Jearld Lesch, DO

## 2020-09-25 ENCOUNTER — Other Ambulatory Visit: Payer: Self-pay

## 2020-09-25 ENCOUNTER — Ambulatory Visit (INDEPENDENT_AMBULATORY_CARE_PROVIDER_SITE_OTHER): Payer: 59 | Admitting: Bariatrics

## 2020-09-25 ENCOUNTER — Encounter (INDEPENDENT_AMBULATORY_CARE_PROVIDER_SITE_OTHER): Payer: Self-pay | Admitting: Bariatrics

## 2020-09-25 ENCOUNTER — Telehealth (INDEPENDENT_AMBULATORY_CARE_PROVIDER_SITE_OTHER): Payer: 59 | Admitting: Psychology

## 2020-09-25 VITALS — BP 146/84 | HR 70 | Temp 97.8°F | Ht 67.0 in | Wt 289.0 lb

## 2020-09-25 DIAGNOSIS — E1129 Type 2 diabetes mellitus with other diabetic kidney complication: Secondary | ICD-10-CM

## 2020-09-25 DIAGNOSIS — F3189 Other bipolar disorder: Secondary | ICD-10-CM | POA: Diagnosis not present

## 2020-09-25 DIAGNOSIS — Z9189 Other specified personal risk factors, not elsewhere classified: Secondary | ICD-10-CM | POA: Diagnosis not present

## 2020-09-25 DIAGNOSIS — E039 Hypothyroidism, unspecified: Secondary | ICD-10-CM

## 2020-09-25 DIAGNOSIS — E785 Hyperlipidemia, unspecified: Secondary | ICD-10-CM

## 2020-09-25 DIAGNOSIS — Z6841 Body Mass Index (BMI) 40.0 and over, adult: Secondary | ICD-10-CM

## 2020-09-25 DIAGNOSIS — F5089 Other specified eating disorder: Secondary | ICD-10-CM

## 2020-09-25 DIAGNOSIS — E1169 Type 2 diabetes mellitus with other specified complication: Secondary | ICD-10-CM | POA: Diagnosis not present

## 2020-09-25 DIAGNOSIS — R809 Proteinuria, unspecified: Secondary | ICD-10-CM

## 2020-09-25 NOTE — Progress Notes (Signed)
Office: [phone]  /  Fax: [phone]    Date: October 09, 2020   Appointment Start Time: 8:00am Duration: 35 minutes Provider: Glennie Isle, Psy.D. Type of Session: Individual Therapy  Location of Patient: Home Location of Provider: Provider's Home (private office) Type of Contact: Telepsychological Visit via MyChart Video Visit  Session Content: Lori Knight is a 50 y.o. female presenting for a follow-up appointment to address the previously established treatment goal of increasing coping skills. Today's appointment was a telepsychological visit due to COVID-19. Lori Knight provided verbal consent for today's telepsychological appointment and she is aware she is responsible for securing confidentiality on her end of the session. Prior to proceeding with today's appointment, Lori Knight's physical location at the time of this appointment was obtained as well a phone number she could be reached at in the event of technical difficulties. Lori Knight and this provider participated in today's telepsychological service.   This provider conducted a brief check-in. Lori Knight reported she started journaling on [employer] as he husband was reportedly given that option at his appointment. She was encouraged to discuss this further with Dr. Owens Shark to ensure that she is meeting her personal calorie/protein goals. She agreed to discuss this further with Dr. Owens Shark during their appointment today. Notably, Lori Knight discussed journaling has helped with increasing awareness of calorie intake and has helped with accountability. Moreover, Lori Knight expressed concern about continuing with her current psychiatric provider. She requested this provider place a referral for psychiatric and therapeutic services. She agreed to a referral for [facility].   [name] today's appointment focused on emotional and physical hunger. She indicated she was unsure if she got a chance to take a look at the handout; therefore, she provided verbal  consent for this provider to re-send it again via e-mail. Lori Knight was receptive to today's appointment as evidenced by openness to sharing, responsiveness to feedback, and willingness to discuss emotional and physical hunger.  Mental Status Examination:  Appearance: well groomed and appropriate hygiene  Behavior: appropriate to circumstances Mood: euthymic Affect: mood congruent Speech: normal in rate, volume, and tone Eye Contact: appropriate Psychomotor Activity: unable to assess  Gait: unable to assess Thought Process: linear, logical, and goal directed  Thought Content/Perception: no hallucinations, delusions, bizarre thinking or behavior reported or observed and denies suicidal and homicidal ideation, plan, and intent Orientation: time, person, place, and purpose of appointment Memory/Concentration: memory, attention, language, and fund of knowledge intact  Insight/Judgment: fair  Interventions:  Conducted a brief chart review Provided empathic reflections and validation Reviewed content from the previous session Employed supportive psychotherapy interventions to facilitate reduced distress and to improve coping skills with identified stressors Psychoeducation provided regarding physical versus emotional hunger Recommended/discussed option for longer-term therapeutic services  DSM-5 Diagnosis(es): 296.89 (F31.89) Other Specified Bipolar and Related Disorder, Emotional Eating Behaviors   Treatment Goal & Progress: During the initial appointment with this provider, the following treatment goal was established: increase coping skills. Progress is limited, as Lori Knight has just begun treatment with this provider; however, she is receptive to the interaction and interventions and rapport is being established.   Plan: The next appointment will be scheduled in two weeks, which will be via MyChart Video Visit. The next session will focus on working towards the established treatment goal.  Additionally, a referral will be placed with [facility]. Moreover, Lori Knight provided verbal consent during today's visit for this provider to speak with Dr. Owens Shark regarding today's appointment as it relates to challenges with the structured meal plan and desire to  journal.

## 2020-09-30 ENCOUNTER — Encounter (INDEPENDENT_AMBULATORY_CARE_PROVIDER_SITE_OTHER): Payer: Self-pay | Admitting: Bariatrics

## 2020-09-30 DIAGNOSIS — I700 Atherosclerosis of aorta: Secondary | ICD-10-CM | POA: Insufficient documentation

## 2020-09-30 NOTE — Progress Notes (Signed)
Chief Complaint:   OBESITY Lori Knight is here to discuss her progress with her obesity treatment plan along with follow-up of her obesity related diagnoses. Lori Knight is on the Category 3 Plan and states she is following her eating plan approximately 50% of the time. Lori Knight states she is not exercising at this time.  Today's visit was #: 2 Starting weight: 289 lbs Starting date: 09/11/2020 Today's weight: 289 lbs Today's date: 09/25/2020 Total lbs lost to date: 0 Total lbs lost since last in-office visit: 0  Interim History: Lori Knight's weight remains the same since her first visit.  She has only been following the plan 50%.  She had to travel for work and had less control.  Subjective:   1. Type 2 diabetes mellitus with proteinuria (HCC) She is taking dulaglutide and Glucophage.  Insulin is 28.7, glucose 122.  Lab Results  Component Value Date   HGBA1C 6.7% 03/19/2020   HGBA1C 7.8 05/31/2019   HGBA1C 7.8 05/31/2019   Lab Results  Component Value Date   MICROALBUR 80 (H) 02/20/2020   LDLCALC 147 (H) 09/11/2020   CREATININE 0.74 09/11/2020   Lab Results  Component Value Date   INSULIN 28.7 (H) 09/11/2020   2. Hyperlipidemia associated with type 2 diabetes mellitus ([place]) Lori Knight has hyperlipidemia and has been trying to improve her cholesterol levels with intensive lifestyle modification including a low saturated fat diet, exercise and weight loss. She denies any chest pain, claudication or myalgias.  Taking fish oil.  Not on a statin.  Lab Results  Component Value Date   ALT 33 (H) 09/11/2020   AST 20 09/11/2020   ALKPHOS 122 (H) 09/11/2020   BILITOT 0.2 09/11/2020   Lab Results  Component Value Date   CHOL 237 (H) 09/11/2020   HDL 43 09/11/2020   LDLCALC 147 (H) 09/11/2020   TRIG 255 (H) 09/11/2020   3. Adult hypothyroidism She is taking Synthroid.  Lab Results  Component Value Date   TSH 0.396 (L) 09/11/2020   4. Other disorder of eating She is seeing Dr.  Mallie Mussel.  5. At risk for heart disease Lori Knight is at a higher than average risk for cardiovascular disease due to obesity.   Assessment/Plan:   1. Type 2 diabetes mellitus with proteinuria (HCC) [name], neuropathy, limb loss, blindness, coronary artery disease, and death. Intensive lifestyle modification including diet, exercise and weight loss are the first line of treatment for diabetes.  Continue medications.   2. Hyperlipidemia associated with type 2 diabetes mellitus ([place]) Cardiovascular risk and specific lipid/LDL goals reviewed.  We discussed several lifestyle modifications today and Lori Knight will continue to work on diet, exercise and weight loss efforts. Orders and follow up as documented in patient record.  Will talk with her endocrinologist about a statin.  Counseling Intensive lifestyle modifications are the first line treatment for this issue. . Dietary changes: Increase soluble fiber. Decrease simple carbohydrates. . Exercise changes: Moderate to vigorous-intensity aerobic activity 150 minutes per week if tolerated. . Lipid-lowering medications: see documented in medical record.  3. Adult hypothyroidism Patient with long-standing hypothyroidism, on levothyroxine therapy. She appears euthyroid. Orders and follow up as documented in patient record.  She will take her medication and nothing within 30 minutes.  Recheck in 6-8 weeks.  Counseling . Good thyroid control is important for overall health. Supratherapeutic thyroid levels are dangerous and will not improve weight loss results. . The  correct way to take levothyroxine is fasting, with water, separated by at least 30 minutes from breakfast, and separated by more than 4 hours from calcium, iron, multivitamins, acid reflux medications (PPIs).   4. Other disorder of eating Continue with Dr. Mallie Mussel.  5. At risk for heart  disease Lori Knight was given approximately 15 minutes of coronary artery disease prevention counseling today. She is 50 y.o. female and has risk factors for heart disease including obesity. We discussed intensive lifestyle modifications today with an emphasis on specific weight loss instructions and strategies.   Repetitive spaced learning was employed today to elicit superior memory formation and behavioral change.  6. Class 3 severe obesity with serious comorbidity and body mass index (BMI) of 45.0 to 49.9 in adult, unspecified obesity type (HCC)  Lori Knight is currently in the action stage of change. As such, her goal is to continue with weight loss efforts. She has agreed to the Category 3 Plan and the [place] (alternate).   She will work on adhering more closely to the plan, meal planning, will pack her lunch.  Sheet provided:  Vegetable Proteins.  Labs from 09/11/2020, including CMP, lipid panel, vitamin D, glucose, insulin, and thyroid panel.  Exercise goals: All adults should avoid inactivity. Some physical activity is better than none, and adults who participate in any amount of physical activity gain some health benefits.  Behavioral modification strategies: increasing lean protein intake, decreasing simple carbohydrates, increasing vegetables, increasing water intake, decreasing eating out, no skipping meals, meal planning and cooking strategies, keeping healthy foods in the home and planning for success.  Lori Knight has agreed to follow-up with our clinic in 2 weeks. She was informed of the importance of frequent follow-up visits to maximize her success with intensive lifestyle modifications for her multiple health conditions.   Objective:   Blood pressure (!) 146/84, pulse 70, temperature 97.8 F (36.6 C), height 5\' 7"  (1.702 m), weight 289 lb (131.1 kg), SpO2 97 %. Body mass index is 45.26 kg/m.  General: Cooperative, alert, well developed, in no acute distress. HEENT: Conjunctivae and  lids unremarkable. Cardiovascular: Regular rhythm.  Lungs: Normal work of breathing. Neurologic: No focal deficits.   Lab Results  Component Value Date   CREATININE 0.74 09/11/2020   BUN 12 09/11/2020   NA 140 09/11/2020   K 4.5 09/11/2020   CL 100 09/11/2020   CO2 24 09/11/2020   Lab Results  Component Value Date   ALT 33 (H) 09/11/2020   AST 20 09/11/2020   ALKPHOS 122 (H) 09/11/2020   BILITOT 0.2 09/11/2020   Lab Results  Component Value Date   HGBA1C 6.7% 03/19/2020   HGBA1C 7.8 05/31/2019   HGBA1C 7.8 05/31/2019   HGBA1C 8.0 07/17/2018   HGBA1C 8.5 (H) 04/17/2018   Lab Results  Component Value Date   INSULIN 28.7 (H) 09/11/2020   Lab Results  Component Value Date   TSH 0.396 (L) 09/11/2020   Lab Results  Component Value Date   CHOL 237 (H) 09/11/2020   HDL 43 09/11/2020   LDLCALC 147 (H) 09/11/2020   TRIG 255 (H) 09/11/2020   Lab Results  Component Value Date   WBC 7.0 07/13/2020   HGB 13.4 07/13/2020   HCT 41.8 07/13/2020   MCV 85.7 07/13/2020   PLT 251 07/13/2020   Lab Results  Component Value Date   IRON 57 12/20/2018   TIBC 388 12/20/2018   FERRITIN 122 12/20/2018   Attestation Statements:   Reviewed by clinician on day  of visit: allergies, medications, problem list, medical history, surgical history, family history, social history, and previous encounter notes.  I, Water quality scientist, CMA, am acting as Location manager for [employer], DO  I have reviewed the above documentation for accuracy and completeness, and I agree with the above. Jearld Lesch, DO

## 2020-10-09 ENCOUNTER — Ambulatory Visit (INDEPENDENT_AMBULATORY_CARE_PROVIDER_SITE_OTHER): Payer: 59 | Admitting: Bariatrics

## 2020-10-09 ENCOUNTER — Telehealth (INDEPENDENT_AMBULATORY_CARE_PROVIDER_SITE_OTHER): Payer: 59 | Admitting: Psychology

## 2020-10-09 ENCOUNTER — Encounter (INDEPENDENT_AMBULATORY_CARE_PROVIDER_SITE_OTHER): Payer: Self-pay | Admitting: Bariatrics

## 2020-10-09 ENCOUNTER — Other Ambulatory Visit: Payer: Self-pay

## 2020-10-09 VITALS — BP 108/75 | HR 87 | Temp 98.1°F | Ht 67.0 in | Wt 284.0 lb

## 2020-10-09 DIAGNOSIS — F3189 Other bipolar disorder: Secondary | ICD-10-CM | POA: Diagnosis not present

## 2020-10-09 DIAGNOSIS — E1169 Type 2 diabetes mellitus with other specified complication: Secondary | ICD-10-CM | POA: Diagnosis not present

## 2020-10-09 DIAGNOSIS — Z9189 Other specified personal risk factors, not elsewhere classified: Secondary | ICD-10-CM

## 2020-10-09 DIAGNOSIS — E669 Obesity, unspecified: Secondary | ICD-10-CM | POA: Diagnosis not present

## 2020-10-09 DIAGNOSIS — E785 Hyperlipidemia, unspecified: Secondary | ICD-10-CM

## 2020-10-09 NOTE — Progress Notes (Signed)
Office: [phone]  /  Fax: [phone]    Date: October 23, 2020   Appointment Start Time: 10:07am Duration: 31 minutes Provider: Glennie Isle, Psy.D. Type of Session: Individual Therapy  Location of Patient: Home Location of Provider: Provider's Home (private office) Type of Contact: Telepsychological Visit via MyChart Video Visit  Session Content: Lori Knight is a 50 y.o. female presenting for a follow-up appointment to address the previously established treatment goal of increasing coping skills. Today's appointment was a telepsychological visit due to COVID-19. Lori Knight provided verbal consent for today's telepsychological appointment and she is aware she is responsible for securing confidentiality on her end of the session. Prior to proceeding with today's appointment, Lori Knight's physical location at the time of this appointment was obtained as well a phone number she could be reached at in the event of technical difficulties. Lori Knight and this provider participated in today's telepsychological service. Of note, today's appointment was initiated late due to this provider.   This provider conducted a brief check-in. Lori Knight shared about recent weight loss, noting, "I was very excited." She acknowledged feeling upset when she went over her prescribed calories.  Thus, Lori Knight noted a plan to increase her calories by 200 and discuss further with Dr. Owens Shark during her next visit on April 21st.   Emotional and physical hunger were reviewed. Session focused on coping with unpleasant emotions/thoughts related to going over her prescribed calories by exploring triggers. Psychoeducation regarding triggers for emotional eating was provided. Lori Knight was provided a handout, and encouraged to utilize the handout between now and the next appointment to increase awareness of triggers and frequency. Lori Knight agreed. This provider also discussed behavioral strategies for specific triggers, such as placing the utensil down when  conversing to avoid mindless eating. Lori Knight provided verbal consent during today's appointment for this provider to send a handout about triggers via e-mail. Overall, Lori Knight was receptive to today's appointment as evidenced by openness to sharing, responsiveness to feedback, and willingness to explore triggers for emotional eating.  Mental Status Examination:  Appearance: well groomed and appropriate hygiene  Behavior: appropriate to circumstances Mood: euthymic Affect: mood congruent Speech: normal in rate, volume, and tone Eye Contact: appropriate Psychomotor Activity: unable to assess  Gait: unable to assess Thought Process: linear, logical, and goal directed  Thought Content/Perception: no hallucinations, delusions, bizarre thinking or behavior reported or observed and no evidence of suicidal and homicidal ideation, plan, and intent Orientation: time, person, place, and purpose of appointment Memory/Concentration: memory, attention, language, and fund of knowledge intact  Insight/Judgment: fair  Interventions:  Conducted a brief chart review Provided empathic reflections and validation Reviewed content from the previous session Employed supportive psychotherapy interventions to facilitate reduced distress and to improve coping skills with identified stressors Psychoeducation provided regarding triggers for emotional eating Recommended/discussed option for longer-term therapeutic services  DSM-5 Diagnosis(es): 296.89 (F31.89) Other Specified Bipolar and Related Disorder, Emotional Eating Behaviors   Treatment Goal & Progress: During the initial appointment with this provider, the following treatment goal was established: increase coping skills. Lori Knight has demonstrated some progress in her goal as evidenced by increased awareness of hunger patterns.   Plan: Based on appointment availability and Lori Knight's schedule, the next appointment will be scheduled in 2-3 weeks, which will be via  MyChart Video Visit. The next session will focus on working towards the established treatment goal. Additionally, Lori Knight shared she received a call from [facility], noting she ws informed there would not be an appointment available for a few months. As such,  she called [facility] and she has an appointment with a new psychiatrist on May 17th. She provided verbal consent for this provider to place a referral with [facility] for supportive therapeutic services for day to day stressors.

## 2020-10-14 ENCOUNTER — Encounter (INDEPENDENT_AMBULATORY_CARE_PROVIDER_SITE_OTHER): Payer: Self-pay | Admitting: Bariatrics

## 2020-10-14 NOTE — Progress Notes (Signed)
Chief Complaint:   OBESITY Lori Knight is here to discuss her progress with her obesity treatment plan along with follow-up of her obesity related diagnoses. Lori Knight is on keeping a food journal and adhering to recommended goals of 1900 calories and 121 g protein and states she is following her eating plan approximately 85% of the time. Lori Knight states she is currently not exercising.  Today's visit was #: 3 Starting weight: 289 lbs Starting date: 09/11/2020 Today's weight: 284 lbs Today's date: 10/09/2020 Total lbs lost to date: 5 lbs Total lbs lost since last in-office visit: 4 lbs  Interim History: Lori Knight is down an additional 5 lbs since her last visit.  Subjective:   1. Hyperlipidemia associated with type 2 diabetes mellitus (Lori Knight) Lori Knight was started on Lipitor per PCP.   Lab Results  Component Value Date   ALT 33 (H) 09/11/2020   AST 20 09/11/2020   ALKPHOS 122 (H) 09/11/2020   BILITOT 0.2 09/11/2020   Lab Results  Component Value Date   CHOL 237 (H) 09/11/2020   HDL 43 09/11/2020   LDLCALC 147 (H) 09/11/2020   TRIG 255 (H) 09/11/2020    2. Diabetes mellitus type 2 in obese (HCC) Lori Knight's FBS run 170-200. Her insulin level is 28.7. She has a history of PCOS. She is taking Metformin and Dulagutide.  Lab Results  Component Value Date   HGBA1C 6.7% 03/19/2020   HGBA1C 7.8 05/31/2019   HGBA1C 7.8 05/31/2019   Lab Results  Component Value Date   MICROALBUR 80 (H) 02/20/2020   LDLCALC 147 (H) 09/11/2020   CREATININE 0.74 09/11/2020   Lab Results  Component Value Date   INSULIN 28.7 (H) 09/11/2020    3. At risk for hypoglycemia Lori Knight is at increased risk for hypoglycemia due to changes in diet, diagnosis of diabetes, and/or insulin use.   Assessment/Plan:   1. Hyperlipidemia associated with type 2 diabetes mellitus ([place]) Cardiovascular risk and specific lipid/LDL goals reviewed.  We discussed several lifestyle modifications today and Lori Knight will continue to work on  diet, exercise and weight loss efforts. Orders and follow up as documented in patient record. Continue statin therapy and fish oil.  Counseling Intensive lifestyle modifications are the first line treatment for this issue. . Dietary changes: Increase soluble fiber. Decrease simple carbohydrates. . Exercise changes: Moderate to vigorous-intensity aerobic activity 150 minutes per week if tolerated. . Lipid-lowering medications: see documented in medical record.  2. Diabetes mellitus type 2 in obese ([place]) [name], neuropathy, limb loss, blindness, coronary artery disease, and death. Intensive lifestyle modification including diet, exercise and weight loss are the first line of treatment for diabetes. Handout: Insulin Resistance and Pre-diabetes provided and discussed with pt.  3. At risk for hypoglycemia Lori Knight was given approximately 15 minutes of counseling today regarding prevention of hypoglycemia. She was advised of symptoms of hypoglycemia. Lori Knight was instructed to avoid skipping meals, eat regular protein rich meals and schedule low calorie snacks as needed.   Repetitive spaced learning was employed today to elicit superior memory formation and behavioral change  4. Obesity, Class III, BMI 40-49.9 (morbid obesity) (Lori Knight) Lori Knight is currently in the action stage of change. As such, her goal is to continue with weight loss efforts. She has agreed to keeping a food journal and adhering to recommended goals of 1500 calories and 85-100 g protein.   Labs reviewed with pt. Handout: Recipes II and  Eating out  Exercise goals: As is  Behavioral modification strategies: increasing lean protein intake, decreasing simple carbohydrates, increasing vegetables, increasing water intake, decreasing eating out, no skipping meals, meal planning and cooking strategies, keeping healthy foods in the home, planning  for success and keeping a strict food journal.  Lori Knight has agreed to follow-up with our clinic in 2 weeks. She was informed of the importance of frequent follow-up visits to maximize her success with intensive lifestyle modifications for her multiple health conditions.   Objective:   Blood pressure 108/75, pulse 87, temperature 98.1 F (36.7 C), height 5\' 7"  (1.702 m), weight 284 lb (128.8 kg), SpO2 97 %. Body mass index is 44.48 kg/m.  General: Cooperative, alert, well developed, in no acute distress. HEENT: Conjunctivae and lids unremarkable. Cardiovascular: Regular rhythm.  Lungs: Normal work of breathing. Neurologic: No focal deficits.   Lab Results  Component Value Date   CREATININE 0.74 09/11/2020   BUN 12 09/11/2020   NA 140 09/11/2020   K 4.5 09/11/2020   CL 100 09/11/2020   CO2 24 09/11/2020   Lab Results  Component Value Date   ALT 33 (H) 09/11/2020   AST 20 09/11/2020   ALKPHOS 122 (H) 09/11/2020   BILITOT 0.2 09/11/2020   Lab Results  Component Value Date   HGBA1C 6.7% 03/19/2020   HGBA1C 7.8 05/31/2019   HGBA1C 7.8 05/31/2019   HGBA1C 8.0 07/17/2018   HGBA1C 8.5 (H) 04/17/2018   Lab Results  Component Value Date   INSULIN 28.7 (H) 09/11/2020   Lab Results  Component Value Date   TSH 0.396 (L) 09/11/2020   Lab Results  Component Value Date   CHOL 237 (H) 09/11/2020   HDL 43 09/11/2020   LDLCALC 147 (H) 09/11/2020   TRIG 255 (H) 09/11/2020   Lab Results  Component Value Date   WBC 7.0 07/13/2020   HGB 13.4 07/13/2020   HCT 41.8 07/13/2020   MCV 85.7 07/13/2020   PLT 251 07/13/2020   Lab Results  Component Value Date   IRON 57 12/20/2018   TIBC 388 12/20/2018   FERRITIN 122 12/20/2018     Attestation Statements:   Reviewed by clinician on day of visit: allergies, medications, problem list, medical history, surgical history, family history, social history, and previous encounter notes.   Coral Ceo, am acting as  Location manager for [employer], DO.  I have reviewed the above documentation for accuracy and completeness, and I agree with the above. Jearld Lesch, DO

## 2020-10-23 ENCOUNTER — Telehealth (INDEPENDENT_AMBULATORY_CARE_PROVIDER_SITE_OTHER): Payer: 59 | Admitting: Psychology

## 2020-10-23 DIAGNOSIS — F3189 Other bipolar disorder: Secondary | ICD-10-CM

## 2020-10-28 NOTE — Progress Notes (Signed)
Office: [phone]  /  Fax: [phone]    Date: Nov 11, 2020   Appointment Start Time: 2:01pm Duration: 36 minutes Provider: Glennie Isle, Psy.D. Type of Session: Individual Therapy  Location of Patient: Home Location of Provider: Provider's Home (private office) Type of Contact: Telepsychological Visit via MyChart Video Visit  Session Content: Lori Knight is a 50 y.o. female presenting for a follow-up appointment to address the previously established treatment goal of increasing coping skills. Today's appointment was a telepsychological visit due to COVID-19. Lori Knight provided verbal consent for today's telepsychological appointment and she is aware she is responsible for securing confidentiality on her end of the session. Prior to proceeding with today's appointment, Lori Knight's physical location at the time of this appointment was obtained as well a phone number she could be reached at in the event of technical difficulties. Lori Knight and this provider participated in today's telepsychological service.   This provider conducted a brief check-in. Lori Knight reported engaging in "completely off the chain eating." She noted deviations starting one week ago. This was further explored and it was reflected she is not consuming enough protein, which likely further contributes to frequent hunger. She also discussed experiencing decreased mood; therefore, this provider and Lori Knight discussed the impact of mood on eating habits. To assist Lori Knight with the reported challenges with eating, psychoeducation regarding SMART goals was provided and Lori Knight was engaged in goal setting. The following goal was established: Lori Knight will journal what she eats at least 4 out of 7 days a week between now and the next appointment with this provider. Moreover, psychoeducation regarding the hunger and satisfaction scale was provided, as Regla described overriding hunger cues when working and then finding herself extremely hungry resulting in  snacking/overeating. She was encouraged to use the scale every two hours for the next week to check in about her hunger cues to increase awareness. She agreed. Lori Knight provided verbal consent during today's appointment for this provider to send a handout with the scale via e-mail. Overall, Lori Knight was receptive to today's appointment as evidenced by openness to sharing, responsiveness to feedback, willingness to implement discussed strategies, and willingness to work toward the established SMART goal.  Mental Status Examination:  Appearance: well groomed and appropriate hygiene  Behavior: appropriate to circumstances Mood: euthymic Affect: mood congruent Speech: normal in rate, volume, and tone Eye Contact: appropriate Psychomotor Activity: unable to assess  Gait: unable to assess Thought Process: linear, logical, and goal directed  Thought Content/Perception: denies suicidal and homicidal ideation, plan, and intent and no hallucinations, delusions, bizarre thinking or behavior reported or observed Orientation: time, person, place, and purpose of appointment Memory/Concentration: memory, attention, language, and fund of knowledge intact  Insight/Judgment: fair  Interventions:  Conducted a brief chart review Provided empathic reflections and validation Employed supportive psychotherapy interventions to facilitate reduced distress and to improve coping skills with identified stressors Engaged patient in goal setting Psychoeducation provided regarding the hunger and satisfaction scale Psychoeducation provided regarding SMART goals  DSM-5 Diagnosis(es): F31.89 Other Specified Bipolar and Related Disorder, Emotional Eating Behaviors   Treatment Goal & Progress: During the initial appointment with this provider, the following treatment goal was established: increase coping skills. Lori Knight has demonstrated progress in her goal as evidenced by increased awareness of hunger patterns and increased  awareness of triggers for emotional eating. Lori Knight also continues to demonstrate willingness to engage in learned skill(s).  Plan: The next appointment will be scheduled in three weeks, which will be via MyChart Video Visit. The next session will  focus on working towards the established treatment goal. Additionally, Lori Knight will be meeting with Lori Knight with Lori Knight for an initial appointment on Nov 28, 2200 to initiate therapeutic services. She discussed a desire for a referral for medication management; therefore, with her verbal consent a referral was placed with Lori Knight). [place] also provided verbal consent for this provider to e-mail additional referral options for psychiatric providers. Moreover, Lori Knight reported she has noticed a change in her mood and believes her medications are not as effective; therefore, she agreed to call her current psychiatrist following today's appointment for medication management.

## 2020-10-30 ENCOUNTER — Other Ambulatory Visit: Payer: Self-pay

## 2020-10-30 ENCOUNTER — Ambulatory Visit (INDEPENDENT_AMBULATORY_CARE_PROVIDER_SITE_OTHER): Payer: 59 | Admitting: Bariatrics

## 2020-10-30 ENCOUNTER — Encounter (INDEPENDENT_AMBULATORY_CARE_PROVIDER_SITE_OTHER): Payer: Self-pay | Admitting: Bariatrics

## 2020-10-30 VITALS — BP 136/86 | HR 72 | Temp 97.7°F | Ht 67.0 in | Wt 279.0 lb

## 2020-10-30 DIAGNOSIS — Z6841 Body Mass Index (BMI) 40.0 and over, adult: Secondary | ICD-10-CM

## 2020-10-30 DIAGNOSIS — E1129 Type 2 diabetes mellitus with other diabetic kidney complication: Secondary | ICD-10-CM | POA: Diagnosis not present

## 2020-10-30 DIAGNOSIS — E1169 Type 2 diabetes mellitus with other specified complication: Secondary | ICD-10-CM

## 2020-10-30 DIAGNOSIS — E785 Hyperlipidemia, unspecified: Secondary | ICD-10-CM

## 2020-10-30 DIAGNOSIS — R809 Proteinuria, unspecified: Secondary | ICD-10-CM

## 2020-10-30 DIAGNOSIS — F5089 Other specified eating disorder: Secondary | ICD-10-CM | POA: Diagnosis not present

## 2020-11-03 NOTE — Progress Notes (Signed)
Chief Complaint:   OBESITY Lori Knight is here to discuss her progress with her obesity treatment plan along with follow-up of her obesity related diagnoses. Lori Knight is on the Category 3 Plan and states she is following her eating plan approximately 75% of the time. Lori Knight states she is not exercising regularly at this time.  Today's visit was #: 4 Starting weight: 289 lbs Starting date: 09/11/2020 Today's weight: 279 lbs Today's date: 10/30/2020 Total lbs lost to date: 10 lbs Total lbs lost since last in-office visit: 5 lbs  Interim History: Lori Knight is down 5 pounds and has done well overall.  She sttes that she has not been able to eat 1500 calories.  Subjective:   1. Type 2 diabetes mellitus with proteinuria (HCC) Taking dulaglutide and metformin.  Lab Results  Component Value Date   HGBA1C 6.7% 03/19/2020   HGBA1C 7.8 05/31/2019   HGBA1C 7.8 05/31/2019   Lab Results  Component Value Date   MICROALBUR 80 (H) 02/20/2020   LDLCALC 147 (H) 09/11/2020   CREATININE 0.74 09/11/2020   Lab Results  Component Value Date   INSULIN 28.7 (H) 09/11/2020   2. Hyperlipidemia associated with type 2 diabetes mellitus ([place]) Lori Knight has hyperlipidemia and has been trying to improve her cholesterol levels with intensive lifestyle modification including a low saturated fat diet, exercise and weight loss. She denies any chest pain, claudication or myalgias.  Taking fish oil.  Total cholesterol 232, LDL 147 on 09/11/2020.  Lab Results  Component Value Date   ALT 33 (H) 09/11/2020   AST 20 09/11/2020   ALKPHOS 122 (H) 09/11/2020   BILITOT 0.2 09/11/2020   Lab Results  Component Value Date   CHOL 237 (H) 09/11/2020   HDL 43 09/11/2020   LDLCALC 147 (H) 09/11/2020   TRIG 255 (H) 09/11/2020   3. Other disorder of eating Working with Dr. Mallie Mussel.  Assessment/Plan:   1. Type 2 diabetes mellitus with proteinuria (HCC) Good blood sugar control is important to decrease the likelihood of diabetic  complications such as nephropathy, neuropathy, limb loss, blindness, coronary artery disease, and death. Intensive lifestyle modification including diet, exercise and weight loss are the first line of treatment for diabetes.  Continue medications.  2. Hyperlipidemia associated with type 2 diabetes mellitus ([place]) Cardiovascular risk and specific lipid/LDL goals reviewed.  We discussed several lifestyle modifications today and Lori Knight will continue to work on diet, exercise and weight loss efforts. Orders and follow up as documented in patient record.  Will consider atorvastatin.  Counseling Intensive lifestyle modifications are the first line treatment for this issue. . Dietary changes: Increase soluble fiber. Decrease simple carbohydrates. . Exercise changes: Moderate to vigorous-intensity aerobic activity 150 minutes per week if tolerated. . Lipid-lowering medications: see documented in medical record.  3. Other disorder of eating Will continue to work with Dr. Mallie Mussel.  4. Obesity, current BMI 43  Lori Knight is currently in the action stage of change. As such, her goal is to continue with weight loss efforts. She has agreed to keeping a food journal and adhering to recommended goals of 1700 calories and 0 protein.   She will work on meal planning, intentional eating, and will stay adherent to the plan.  Labs from 10/28/2020, including lipid panel (total 137, TG 198, HDL 365, LDL 65, Chol/HDL ratio 4.2), were reviewed today.  Exercise goals: No exercise has been prescribed at this time.  Behavioral modification strategies: increasing lean protein intake, decreasing simple carbohydrates, increasing vegetables, increasing water  intake, decreasing eating out, no skipping meals, meal planning and cooking strategies, keeping healthy foods in the home and planning for success.  Lori Knight has agreed to follow-up with our clinic in 2-3 weeks. She was informed of the importance of frequent follow-up visits to  maximize her success with intensive lifestyle modifications for her multiple health conditions.   Objective:   Blood pressure 136/86, pulse 72, temperature 97.7 F (36.5 C), height 5\' 7"  (1.702 m), weight 279 lb (126.6 kg), SpO2 96 %. Body mass index is 43.7 kg/m.  General: Cooperative, alert, well developed, in no acute distress. HEENT: Conjunctivae and lids unremarkable. Cardiovascular: Regular rhythm.  Lungs: Normal work of breathing. Neurologic: No focal deficits.   Lab Results  Component Value Date   CREATININE 0.74 09/11/2020   BUN 12 09/11/2020   NA 140 09/11/2020   K 4.5 09/11/2020   CL 100 09/11/2020   CO2 24 09/11/2020   Lab Results  Component Value Date   ALT 33 (H) 09/11/2020   AST 20 09/11/2020   ALKPHOS 122 (H) 09/11/2020   BILITOT 0.2 09/11/2020   Lab Results  Component Value Date   HGBA1C 6.7% 03/19/2020   HGBA1C 7.8 05/31/2019   HGBA1C 7.8 05/31/2019   HGBA1C 8.0 07/17/2018   HGBA1C 8.5 (H) 04/17/2018   Lab Results  Component Value Date   INSULIN 28.7 (H) 09/11/2020   Lab Results  Component Value Date   TSH 0.396 (L) 09/11/2020   Lab Results  Component Value Date   CHOL 237 (H) 09/11/2020   HDL 43 09/11/2020   LDLCALC 147 (H) 09/11/2020   TRIG 255 (H) 09/11/2020   Lab Results  Component Value Date   WBC 7.0 07/13/2020   HGB 13.4 07/13/2020   HCT 41.8 07/13/2020   MCV 85.7 07/13/2020   PLT 251 07/13/2020   Lab Results  Component Value Date   IRON 57 12/20/2018   TIBC 388 12/20/2018   FERRITIN 122 12/20/2018   Attestation Statements:   Reviewed by clinician on day of visit: allergies, medications, problem list, medical history, surgical history, family history, social history, and previous encounter notes.  Time spent on visit including pre-visit chart review and post-visit care and charting was 20 minutes.   I, Water quality scientist, CMA, am acting as Location manager for [employer], DO  I have reviewed the above documentation for  accuracy and completeness, and I agree with the above. Jearld Lesch, DO

## 2020-11-04 ENCOUNTER — Encounter (INDEPENDENT_AMBULATORY_CARE_PROVIDER_SITE_OTHER): Payer: Self-pay | Admitting: Bariatrics

## 2020-11-05 ENCOUNTER — Ambulatory Visit (INDEPENDENT_AMBULATORY_CARE_PROVIDER_SITE_OTHER): Payer: 59 | Admitting: Nurse Practitioner

## 2020-11-05 ENCOUNTER — Encounter: Payer: Self-pay | Admitting: Nurse Practitioner

## 2020-11-05 ENCOUNTER — Other Ambulatory Visit: Payer: Self-pay

## 2020-11-05 DIAGNOSIS — D329 Benign neoplasm of meninges, unspecified: Secondary | ICD-10-CM | POA: Diagnosis not present

## 2020-11-05 DIAGNOSIS — I700 Atherosclerosis of aorta: Secondary | ICD-10-CM

## 2020-11-05 DIAGNOSIS — Z114 Encounter for screening for human immunodeficiency virus [HIV]: Secondary | ICD-10-CM

## 2020-11-05 DIAGNOSIS — E1169 Type 2 diabetes mellitus with other specified complication: Secondary | ICD-10-CM

## 2020-11-05 DIAGNOSIS — E1129 Type 2 diabetes mellitus with other diabetic kidney complication: Secondary | ICD-10-CM | POA: Diagnosis not present

## 2020-11-05 DIAGNOSIS — Z9989 Dependence on other enabling machines and devices: Secondary | ICD-10-CM

## 2020-11-05 DIAGNOSIS — E1159 Type 2 diabetes mellitus with other circulatory complications: Secondary | ICD-10-CM | POA: Diagnosis not present

## 2020-11-05 DIAGNOSIS — G4733 Obstructive sleep apnea (adult) (pediatric): Secondary | ICD-10-CM

## 2020-11-05 DIAGNOSIS — E785 Hyperlipidemia, unspecified: Secondary | ICD-10-CM

## 2020-11-05 DIAGNOSIS — Z1231 Encounter for screening mammogram for malignant neoplasm of breast: Secondary | ICD-10-CM

## 2020-11-05 DIAGNOSIS — E039 Hypothyroidism, unspecified: Secondary | ICD-10-CM

## 2020-11-05 DIAGNOSIS — I152 Hypertension secondary to endocrine disorders: Secondary | ICD-10-CM

## 2020-11-05 DIAGNOSIS — R809 Proteinuria, unspecified: Secondary | ICD-10-CM

## 2020-11-05 DIAGNOSIS — F309 Manic episode, unspecified: Secondary | ICD-10-CM

## 2020-11-05 NOTE — Assessment & Plan Note (Signed)
Continue collaboration with weight management in [place], which she finds value with.  Recommended eating smaller high protein, low fat meals more frequently and exercising 30 mins a day 5 times a week with a goal of 10-15lb weight loss in the next 3 months. Patient voiced their understanding and motivation to adhere to these recommendations.

## 2020-11-05 NOTE — Assessment & Plan Note (Signed)
Chronic, ongoing with recent A1C at endocrinology 6.8%.  Urine ALB up to date on review their records.  Continue Losartan for kidney protection.  Continue current medication regimen as prescribed by endocrinology and adjust as needed.  Educated on BS goals in morning and 2 hours after meals.  Return in 3 months.

## 2020-11-05 NOTE — Assessment & Plan Note (Addendum)
Followed by neurology, continue collaboration with neurology team and recent notes reviewed. 

## 2020-11-05 NOTE — Patient Instructions (Signed)
[facility]  Address: [address], [place], [place] [address]  Phone: [phone]   Mammogram A mammogram is an X-ray of the breasts. This is done to check for changes that are not normal. This test can look for changes that may be caused by breast cancer or other problems. Mammograms are regularly done on women beginning at age 51. A man may have a mammogram if he has a lump or swelling in his breast. Tell a doctor:  About any allergies you have.  If you have breast implants.  If you have had breast disease, biopsy, or surgery.  If you have a family history of breast cancer.  If you are breastfeeding.  Whether you are pregnant or may be pregnant. What are the risks? Generally, this is a safe procedure. But problems may occur, including:  Being exposed to radiation. Radiation levels are very low with this test.  The need for more tests.  The results were not read properly.  Trouble finding breast cancer in women with dense breasts. What happens before the test?  Have this test done about 1-2 weeks after your menstrual period. This is often when your breasts are the least tender.  If you are visiting a new doctor or clinic, have any past mammogram images sent to your new doctor's office.  Wash your breasts and under your arms on the day of the test.  Do not use deodorants, perfumes, lotions, or powders on the day of the test.  Take off any jewelry from your neck.  Wear clothes that you can change into and out of easily. What happens during the test?  You will take off your clothes from the waist up. You will put on a gown.  You will stand in front of the X-ray machine.  Each breast will be placed between two plastic or glass plates. The plates will press down on your breast for a few seconds. Try to relax. This does not cause any harm to your breasts. It may not feel comfortable, but it will be very brief.  X-rays will be  taken from different angles of each breast. The procedure may vary among doctors and hospitals.   What can I expect after the test?  The mammogram will be read by a specialist (radiologist).  You may need to do parts of the test again. This depends on the quality of the images.  You may go back to your normal activities.  It is up to you to get the results of your test. Ask how to get your results when they are ready. Summary  A mammogram is an X-ray of the breasts. It looks for changes that may be caused by breast cancer or other problems.  A man may have this test if he has a lump or swelling in his breast.  Before the test, tell your doctor about any breast problems that you have had in the past.  Have this test done about 1-2 weeks after your menstrual period.  Ask when your test results will be ready. Make sure you get your test results. This information is not intended to replace advice given to you by your health care provider. Make sure you discuss any questions you have with your health care provider. Document Revised: 04/28/2020 Document Reviewed: 04/28/2020 Elsevier Patient Education  2021 [employer].

## 2020-11-05 NOTE — Assessment & Plan Note (Signed)
Recommend 100% use of CPAP for overall health.

## 2020-11-05 NOTE — Assessment & Plan Note (Signed)
Chronic, ongoing with BP at goal today in office.  Continue current medication regimen and adjust as needed.  Focus on DASH diet and regular exercise at home.  Recommend she check BP at few days a week at home and document for provider.  Continue to collaborate with weight management.  Return in 3 months.

## 2020-11-05 NOTE — Assessment & Plan Note (Signed)
Chronic, stable, managed by endocrinology.  Continue current medication regimen as prescribed by endocrinology and adjust as needed.  Recent labs obtained by endocrinology and within normal range on review.

## 2020-11-05 NOTE — Progress Notes (Signed)
BP 120/82   Pulse 78   Temp 98.6 F (37 C) (Oral)   Wt 284 lb 12.8 oz (129.2 kg)   SpO2 95%   BMI 44.61 kg/m    Subjective:    Patient ID: Lori Knight, female    DOB: [birth year], 50 y.o.   MRN: [id number]  HPI: Lori Knight is a 50 y.o. female  Chief Complaint  Patient presents with  . Diabetes  . Hyperlipidemia  . Hypertension  . Mood  . Menopause    Patient would like to discuss menopausal symptoms and discuss with provider.    DIABETES Followed by endocrinology and last saw 11/05/20 with her A1c 6.8%. Continues on Metformin 1000 MG BID (intolerant to higher dosing), Trulicity 1.5 MG weekly.  Was taken off Glipizide 5 MG daily in January, is concerned that they will return to this.    Her goal is weight loss, she is currently working with [facility] in [place]. Hypoglycemic episodes:no Polydipsia/polyuria:occasional, but sees urology for nocturia issues, last saw on 04/28/20 Visual disturbance:no Chest pain:no Paresthesias:no Glucose Monitoring:no Accucheck frequency: daily Fasting glucose: did not check this morning Post prandial: Evening: Before meals: Taking Insulin?:no Long acting insulin: Short acting insulin: Blood Pressure Monitoring:not checking Retinal Examination:Up to Date Foot Exam:Up to Date Diabetic Education:Completed Pneumovax:Up to Date Influenza:Up to Date Aspirin:no  HYPOTHYROIDISM Followed by endocrinology and continues on Levothyroxine 175 MCG 6 days per week, takes 2 tablets on Sunday. TSH on 10/28/20 was 0.577. Thyroid control status:stable Satisfied with current treatment?yes Medication side effects:no Medication compliance:good compliance Etiology of hypothyroidism:  Recent dose adjustment:no Fatigue:no Cold intolerance:no Heat intolerance:no Weight gain:no Weight  loss:no Constipation:no Diarrhea/loose stools:no Palpitations:no Lower extremity edema:no Anxiety/depressed mood:no  HYPERTENSION / HYPERLIPIDEMIA Continues on Losartan 100 MG, Amlodipine 5 MG and Atorvastatin 20 MG QHS. Recent LDL 65.  Has CPAP using 3-4 nights a week, not using consistently.  Her last echo in 2019 noted EF 50-55%. Saw neurosurgery, Dr. Lacinda Axon, for follow-up meningioma which remains stable.   Duration of hypertension:chronic BP monitoring frequency:not checking BP range:  BP medication side effects:no Duration of hyperlipidemia:chronic Aspirin:no Recent stressors:no Recurrent headaches:no Visual changes:no Palpitations:no Dyspnea:no Chest pain:no Lower extremity edema:no Dizzy/lightheaded:no The ASCVD Risk score Mikey Bussing DC Jr., et al., 2013) failed to calculate for the following reasons: The patient has a prior MI or stroke diagnosis  BIPOLAR DISORDER Currently taking Lamictal 150 MG BID -- she is currently working on seeing new psychiatrist.  Feels menopause is effecting  Mood status: stable Satisfied with current treatment?: yes Symptom severity: moderate  Duration of current treatment : chronic Side effects: no Medication compliance: good compliance Psychotherapy/counseling: at present Depressed mood: yes Anxious mood: for awhile Anhedonia: no Significant weight loss or gain: no Insomnia: none Fatigue: yes Feelings of worthlessness or guilt: no Impaired concentration/indecisiveness: yes Suicidal ideations: no Hopelessness: no Crying spells: yes Depression screen [facility] 2/9 11/05/2020 09/11/2020 02/20/2020 01/02/2020 12/24/2019  Decreased Interest 2 3 0 0 0  Down, Depressed, Hopeless 2 3 0 0 1  PHQ - 2 Score 4 6 0 0 1  Altered sleeping 2 3 0 - -  Tired, decreased energy '2 3 1 ' - -  Change in appetite '2 3 1 ' - -  Feeling bad or failure about yourself  2 3 0 - -  Trouble concentrating 1 3 0 - -  Moving slowly or fidgety/restless 1 3  0 - -  Suicidal thoughts 0 0 0 - -  PHQ-9 Score '14 24 2 ' - -  Difficult doing work/chores Somewhat difficult - Not difficult at all - -  Some recent data might be hidden   Relevant past medical, surgical, family and social history reviewed and updated as indicated. Interim medical history since our last visit reviewed. Allergies and medications reviewed and updated.  Review of Systems  Constitutional: Negative for activity change, appetite change, diaphoresis, fatigue and fever.  Respiratory: Negative for cough, chest tightness and shortness of breath.   Cardiovascular: Negative for chest pain, palpitations and leg swelling.  Gastrointestinal: Negative.   Endocrine: Negative for cold intolerance, heat intolerance, polydipsia, polyphagia and polyuria.  Neurological: Negative.   Psychiatric/Behavioral: Negative.     Per HPI unless specifically indicated above     Objective:    BP 120/82   Pulse 78   Temp 98.6 F (37 C) (Oral)   Wt 284 lb 12.8 oz (129.2 kg)   SpO2 95%   BMI 44.61 kg/m   Wt Readings from Last 3 Encounters:  11/05/20 284 lb 12.8 oz (129.2 kg)  10/30/20 279 lb (126.6 kg)  10/09/20 284 lb (128.8 kg)    Physical Exam Vitals and nursing note reviewed.  Constitutional:      General: She is awake. She is not in acute distress.    Appearance: She is well-developed and well-groomed. She is morbidly obese. She is not ill-appearing.  HENT:     Head: Normocephalic.     Right Ear: Hearing normal.     Left Ear: Hearing normal.  Eyes:     General: Lids are normal.        Right eye: No discharge.        Left eye: No discharge.     Conjunctiva/sclera: Conjunctivae normal.     Pupils: Pupils are equal, round, and reactive to light.  Neck:     Thyroid: No thyromegaly.     Vascular: No carotid bruit.  Cardiovascular:     Rate and Rhythm: Normal rate and regular rhythm.     Heart sounds: Normal heart sounds. No murmur heard. No gallop.   Pulmonary:     Effort:  Pulmonary effort is normal. No accessory muscle usage or respiratory distress.     Breath sounds: Normal breath sounds.  Abdominal:     General: Bowel sounds are normal.     Palpations: Abdomen is soft.  Musculoskeletal:     Cervical back: Normal range of motion and neck supple.     Right lower leg: No edema.     Left lower leg: No edema.  Lymphadenopathy:     Cervical: No cervical adenopathy.  Skin:    General: Skin is warm and dry.  Neurological:     Mental Status: She is alert and oriented to person, place, and time.  Psychiatric:        Attention and Perception: Attention normal.        Mood and Affect: Mood normal.        Speech: Speech normal.        Behavior: Behavior normal. Behavior is cooperative.        Thought Content: Thought content normal.    Diabetic Foot Exam - Simple   Simple Foot Form Visual Inspection No deformities, no ulcerations, no other skin breakdown bilaterally: Yes Sensation Testing Intact to touch and monofilament testing bilaterally: Yes Pulse Check Posterior Tibialis and Dorsalis pulse intact bilaterally: Yes Comments    Results for orders placed or performed in visit on 09/11/20  Comprehensive metabolic panel  Result Value Ref  Range   Glucose 122 (H) 65 - 99 mg/dL   BUN 12 6 - 24 mg/dL   Creatinine, Ser 0.74 0.57 - 1.00 mg/dL   eGFR 98 >59 mL/min/1.73   BUN/Creatinine Ratio 16 9 - 23   Sodium 140 134 - 144 mmol/L   Potassium 4.5 3.5 - 5.2 mmol/L   Chloride 100 96 - 106 mmol/L   CO2 24 20 - 29 mmol/L   Calcium 9.5 8.7 - 10.2 mg/dL   Total Protein 7.1 6.0 - 8.5 g/dL   Albumin 4.2 3.8 - 4.9 g/dL   Globulin, Total 2.9 1.5 - 4.5 g/dL   Albumin/Globulin Ratio 1.4 1.2 - 2.2   Bilirubin Total 0.2 0.0 - 1.2 mg/dL   Alkaline Phosphatase 122 (H) 44 - 121 IU/L   AST 20 0 - 40 IU/L   ALT 33 (H) 0 - 32 IU/L  Insulin, random  Result Value Ref Range   INSULIN 28.7 (H) 2.6 - 24.9 uIU/mL  Lipid Panel With LDL/HDL Ratio  Result Value Ref Range    Cholesterol, Total 237 (H) 100 - 199 mg/dL   Triglycerides 255 (H) 0 - 149 mg/dL   HDL 43 >39 mg/dL   VLDL Cholesterol Cal 47 (H) 5 - 40 mg/dL   LDL Chol Calc (NIH) 147 (H) 0 - 99 mg/dL   LDL/HDL Ratio 3.4 (H) 0.0 - 3.2 ratio  T3  Result Value Ref Range   T3, Total 92 71 - 180 ng/dL  T4, free  Result Value Ref Range   Free T4 1.49 0.82 - 1.77 ng/dL  TSH  Result Value Ref Range   TSH 0.396 (L) 0.450 - 4.500 uIU/mL  VITAMIN D 25 Hydroxy (Vit-D Deficiency, Fractures)  Result Value Ref Range   Vit D, 25-Hydroxy 51.0 30.0 - 100.0 ng/mL      Assessment & Plan:   Problem List Items Addressed This Visit      Cardiovascular and Mediastinum   Hypertension associated with diabetes ([place])    Chronic, ongoing with BP at goal today in office.  Continue current medication regimen and adjust as needed.  Focus on DASH diet and regular exercise at home.  Recommend she check BP at few days a week at home and document for provider.  Continue to collaborate with weight management.  Return in 3 months.      Relevant Medications   atorvastatin (LIPITOR) 20 MG tablet   Aortic atherosclerosis ([place])    Noted on imaging 03/17/2018.  Recommend continued use of statin daily and consider addition of ASA in future for prevention.      Relevant Medications   atorvastatin (LIPITOR) 20 MG tablet     Respiratory   OSA on CPAP    Recommend 100% use of CPAP for overall health.        Endocrine   Adult hypothyroidism    Chronic, stable, managed by endocrinology.  Continue current medication regimen as prescribed by endocrinology and adjust as needed.  Recent labs obtained by endocrinology and within normal range on review.      Type 2 diabetes mellitus with morbid obesity (HCC) - Primary    Chronic, ongoing with recent A1C at endocrinology 6.8%.  Continue current medication regimen as prescribed by endocrinology and adjust as needed.  Educated on BS goals in morning and 2 hours after meals. Continue to  collaborate with weight management. Return in 3 months.      Relevant Medications   atorvastatin (LIPITOR) 20 MG tablet  Hyperlipidemia associated with type 2 diabetes mellitus (HCC)    Chronic, ongoing.  Continue on statin, recently started and is benefiting LDL, discussed with her.  Lipid panel up to date.  Return in 3 months.      Relevant Medications   atorvastatin (LIPITOR) 20 MG tablet   Type 2 diabetes mellitus with proteinuria (HCC)    Chronic, ongoing with recent A1C at endocrinology 6.8%.  Urine ALB up to date on review their records.  Continue Losartan for kidney protection.  Continue current medication regimen as prescribed by endocrinology and adjust as needed.  Educated on BS goals in morning and 2 hours after meals.  Return in 3 months.      Relevant Medications   atorvastatin (LIPITOR) 20 MG tablet     Nervous and Auditory   Meningioma ([place])    Followed by neurology, continue collaboration with neurology team and recent notes reviewed.        Other   Bipolar I disorder, single manic episode (HCC)    Chronic, stable.  Denies SI/HI.  Followed by psychiatry, will continue this collaboration and current medication regimen as prescribed by them.  She reports stable mood with Lamictal, but has noticed with hormone shifts some more tearfulness.  Check level today.  Return in 3 months.      Relevant Orders   Lamotrigine level   Obesity, Class III, BMI 40-49.9 (morbid obesity) ([place])    Continue collaboration with weight management in [place], which she finds value with.  Recommended eating smaller high protein, low fat meals more frequently and exercising 30 mins a day 5 times a week with a goal of 10-15lb weight loss in the next 3 months. Patient voiced their understanding and motivation to adhere to these recommendations.        Other Visit Diagnoses    Encounter for screening mammogram for malignant neoplasm of breast       Mammogram ordered   Relevant Orders    MM 3D SCREEN BREAST BILATERAL   Encounter for screening for HIV       HIV screening on labs today, discussed with patient.   Relevant Orders   HIV Antibody (routine testing w rflx)       Follow up plan: Return in about 3 months (around 02/04/2021) for T2DM, HTN/HLD, THYROID, MOOD.

## 2020-11-05 NOTE — Assessment & Plan Note (Signed)
Chronic, ongoing with recent A1C at endocrinology 6.8%.  Continue current medication regimen as prescribed by endocrinology and adjust as needed.  Educated on BS goals in morning and 2 hours after meals. Continue to collaborate with weight management. Return in 3 months.

## 2020-11-05 NOTE — Assessment & Plan Note (Signed)
Chronic, stable.  Denies SI/HI.  Followed by psychiatry, will continue this collaboration and current medication regimen as prescribed by them.  She reports stable mood with Lamictal, but has noticed with hormone shifts some more tearfulness.  Check level today.  Return in 3 months.

## 2020-11-05 NOTE — Assessment & Plan Note (Signed)
Chronic, ongoing.  Continue on statin, recently started and is benefiting LDL, discussed with her.  Lipid panel up to date.  Return in 3 months.

## 2020-11-05 NOTE — Assessment & Plan Note (Signed)
Noted on imaging 03/17/2018.  Recommend continued use of statin daily and consider addition of ASA in future for prevention. °

## 2020-11-06 ENCOUNTER — Encounter (INDEPENDENT_AMBULATORY_CARE_PROVIDER_SITE_OTHER): Payer: Self-pay | Admitting: Bariatrics

## 2020-11-06 LAB — LAMOTRIGINE LEVEL: Lamotrigine Lvl: 2.6 ug/mL (ref 2.0–20.0)

## 2020-11-06 LAB — HIV ANTIBODY (ROUTINE TESTING W REFLEX): HIV Screen 4th Generation wRfx: NONREACTIVE

## 2020-11-06 NOTE — Progress Notes (Signed)
Contacted via [place] level is in normal range [place], that is good news.

## 2020-11-06 NOTE — Progress Notes (Signed)
Contacted via MyChart   Good morning Lori Knight, HIV testing is negative.  Waiting on Lamictal level and will alert you when this returns.  :) Keep being awesome!!  Thank you for allowing me to participate in your care. Kindest regards, Louetta Hollingshead

## 2020-11-11 ENCOUNTER — Telehealth (INDEPENDENT_AMBULATORY_CARE_PROVIDER_SITE_OTHER): Payer: 59 | Admitting: Psychology

## 2020-11-11 DIAGNOSIS — F3189 Other bipolar disorder: Secondary | ICD-10-CM | POA: Diagnosis not present

## 2020-11-18 NOTE — Progress Notes (Unsigned)
Office: [phone]  /  Fax: [phone]    Date: Dec 02, 2020   Appointment Start Time: *** Duration: *** minutes Provider: Glennie Isle, Psy.D. Type of Session: Individual Therapy  Location of Patient: {gbptloc:23249} Location of Provider: Provider's home (private office) Type of Contact: Telepsychological Visit via MyChart Video Visit  Session Content: Lori Knight is a 50 y.o. female presenting for a follow-up appointment to address the previously established treatment goal of increasing coping skills. Today's appointment was a telepsychological visit due to COVID-19. [name] provided verbal consent for today's telepsychological appointment and she is aware she is responsible for securing confidentiality on her end of the session. Prior to proceeding with today's appointment, [name]'s physical location at the time of this appointment was obtained as well a phone number she could be reached at in the event of technical difficulties. [name] and this provider participated in today's telepsychological service.   This provider conducted a brief check-in and verbally administered the PHQ-9 and GAD-7. *** [name] was receptive to today's appointment as evidenced by openness to sharing, responsiveness to feedback, and {gbreceptiveness:23401}.  Mental Status Examination:  Appearance: {Appearance:22431} Behavior: {Behavior:22445} Mood: {gbmood:21757} Affect: {Affect:22436} Speech: {Speech:22432} Eye Contact: {Eye Contact:22433} Psychomotor Activity: {Motor Activity:22434} Gait: {gbgait:23404} Thought Process: {thought process:22448}  Thought Content/Perception: {disturbances:22451} Orientation: {Orientation:22437} Memory/Concentration: {gbcognition:22449} Insight/Judgment: {Insight:22446}  Structured Assessments Results: The Patient Health Questionnaire-9 (PHQ-9) is a self-report measure that assesses symptoms and severity of depression over the course of the last two weeks. [name] obtained a score  of *** suggesting {GBPHQ9SEVERITY:21752}. [name] finds the endorsed symptoms to be {gbphq9difficulty:21754}. [0= Not at all; 1= Several days; 2= More than half the days; 3= Nearly every day] Little interest or pleasure in doing things ***  Feeling down, depressed, or hopeless ***  Trouble falling or staying asleep, or sleeping too much ***  Feeling tired or having little energy ***  Poor appetite or overeating ***  Feeling bad about yourself --- or that you are a failure or have let yourself or your family down ***  Trouble concentrating on things, such as reading the newspaper or watching television ***  Moving or speaking so slowly that other people could have noticed? Or the opposite --- being so fidgety or restless that you have been moving around a lot more than usual ***  Thoughts that you would be better off dead or hurting yourself in some way ***  PHQ-9 Score ***    The Generalized Anxiety Disorder-7 (GAD-7) is a brief self-report measure that assesses symptoms of anxiety over the course of the last two weeks. [name] obtained a score of *** suggesting {gbgad7severity:21753}. [name] finds the endorsed symptoms to be {gbphq9difficulty:21754}. [0= Not at all; 1= Several days; 2= Over half the days; 3= Nearly every day] Feeling nervous, anxious, on edge ***  Not being able to stop or control worrying ***  Worrying too much about different things ***  Trouble relaxing ***  Being so restless that it's hard to sit still ***  Becoming easily annoyed or irritable ***  Feeling afraid as if something awful might happen ***  GAD-7 Score ***   Interventions:  {Interventions for Progress Notes:23405}  DSM-5 Diagnosis(es): F31.89 Other Specified Bipolar and Related Disorder, Emotional Eating Behaviors   Treatment Goal & Progress: During the initial appointment with this provider, the following treatment goal was established: increase coping skills. [name] has demonstrated progress in her goal as  evidenced by {gbtxprogress:22839}. [name] also {gbtxprogress2:22951}.  Plan: The next appointment will be scheduled in {  gbweeks:21758}, which will be {gbtxmodality:23402}. The next session will focus on {Plan for Next Appointment:23400}.

## 2020-11-24 ENCOUNTER — Ambulatory Visit (INDEPENDENT_AMBULATORY_CARE_PROVIDER_SITE_OTHER): Payer: 59 | Admitting: Bariatrics

## 2020-11-27 ENCOUNTER — Other Ambulatory Visit: Payer: Self-pay

## 2020-11-27 ENCOUNTER — Encounter: Payer: Self-pay | Admitting: Nurse Practitioner

## 2020-11-27 ENCOUNTER — Ambulatory Visit (INDEPENDENT_AMBULATORY_CARE_PROVIDER_SITE_OTHER): Payer: 59 | Admitting: Nurse Practitioner

## 2020-11-27 VITALS — BP 133/84 | HR 82 | Temp 98.8°F | Wt 281.2 lb

## 2020-11-27 DIAGNOSIS — K219 Gastro-esophageal reflux disease without esophagitis: Secondary | ICD-10-CM

## 2020-11-27 DIAGNOSIS — R432 Parageusia: Secondary | ICD-10-CM | POA: Diagnosis not present

## 2020-11-27 NOTE — Assessment & Plan Note (Signed)
Chronic, ongoing with current dysgeusia -- ?gum disease vs GERD related.  Low suspicion for Covid related, but possibility due to symptoms starting in March and Covid in January.  Continue Omeprazole 40 MG daily and will get into GI for further assessment.  May benefit from endoscopy, has not had.  Return as scheduled.

## 2020-11-27 NOTE — Patient Instructions (Signed)
[facility] at [facility]  Address: [address] [place], [place], [place] [address]  Phone: [phone]   Food Choices for Gastroesophageal Reflux Disease, Adult When you have gastroesophageal reflux disease (GERD), the foods you eat and your eating habits are very important. Choosing the right foods can help ease your discomfort. Think about working with a food expert (dietitian) to help you make good choices. What are tips for following this plan? Reading food labels  Look for foods that are low in saturated fat. Foods that may help with your symptoms include: ? Foods that have less than 5% of daily value (DV) of fat. ? Foods that have 0 grams of trans fat. Cooking  Do not fry your food.  Cook your food by baking, steaming, grilling, or broiling. These are all methods that do not need a lot of fat for cooking.  To add flavor, try to use herbs that are low in spice and acidity. Meal planning  Choose healthy foods that are low in fat, such as: ? Fruits and vegetables. ? Whole grains. ? Low-fat dairy products. ? Lean meats, fish, and poultry.  Eat small meals often instead of eating 3 large meals each day. Eat your meals slowly in a place where you are relaxed. Avoid bending over or lying down until 2-3 hours after eating.  Limit high-fat foods such as fatty meats or fried foods.  Limit your intake of fatty foods, such as oils, butter, and shortening.  Avoid the following as told by your doctor: ? Foods that cause symptoms. These may be different for different people. Keep a food diary to keep track of foods that cause symptoms. ? Alcohol. ? Drinking a lot of liquid with meals. ? Eating meals during the 2-3 hours before bed.   Lifestyle  Stay at a healthy weight. Ask your doctor what weight is healthy for you. If you need to lose weight, work with your doctor to do so safely.  Exercise for at least 30 minutes on 5 or more days each week, or as told by  your doctor.  Wear loose-fitting clothes.  Do not smoke or use any products that contain nicotine or tobacco. If you need help quitting, ask your doctor.  Sleep with the head of your bed higher than your feet. Use a wedge under the mattress or blocks under the bed frame to raise the head of the bed.  Chew sugar-free gum after meals. What foods should eat? Eat a healthy, well-balanced diet of fruits, vegetables, whole grains, low-fat dairy products, lean meats, fish, and poultry. Each person is different. Foods that may cause symptoms in one person may not cause any symptoms in another person. Work with your doctor to find foods that are safe for you. The items listed above may not be a complete list of what you can eat and drink. Contact a food expert for more options.   What foods should I avoid? Limiting some of these foods may help in managing the symptoms of GERD. Everyone is different. Talk with a food expert or your doctor to help you find the exact foods to avoid, if any. Fruits Any fruits prepared with added fat. Any fruits that cause symptoms. For some people, this may include citrus fruits, such as oranges, grapefruit, pineapple, and lemons. Vegetables Deep-fried vegetables. [place] fries. Any vegetables prepared with added fat. Any vegetables that cause symptoms. For some people, this may include tomatoes and tomato products, chili peppers, onions and garlic,  and horseradish. Grains Pastries or quick breads with added fat. Meats and other proteins High-fat meats, such as fatty beef or pork, hot dogs, ribs, ham, sausage, salami, and bacon. Fried meat or protein, including fried fish and fried chicken. Nuts and nut butters, in large amounts. Dairy Whole milk and chocolate milk. Sour cream. Cream. Ice cream. Cream cheese. Milkshakes. Fats and oils Butter. Margarine. Shortening. Ghee. Beverages Coffee and tea, with or without caffeine. Carbonated beverages. Sodas. Energy drinks.  Fruit juice made with acidic fruits, such as orange or grapefruit. Tomato juice. Alcoholic drinks. Sweets and desserts Chocolate and cocoa. Donuts. Seasonings and condiments Pepper. Peppermint and spearmint. Added salt. Any condiments, herbs, or seasonings that cause symptoms. For some people, this may include curry, hot sauce, or vinegar-based salad dressings. The items listed above may not be a complete list of what you should not eat and drink. Contact a food expert for more options. Questions to ask your doctor Diet and lifestyle changes are often the first steps that are taken to manage symptoms of GERD. If diet and lifestyle changes do not help, talk with your doctor about taking medicines. Where to find more information  International Foundation for Gastrointestinal Disorders: aboutgerd.org Summary  When you have GERD, food and lifestyle choices are very important in easing your symptoms.  Eat small meals often instead of 3 large meals a day. Eat your meals slowly and in a place where you are relaxed.  Avoid bending over or lying down until 2-3 hours after eating.  Limit high-fat foods such as fatty meats or fried foods. This information is not intended to replace advice given to you by your health care provider. Make sure you discuss any questions you have with your health care provider. Document Revised: 01/07/2020 Document Reviewed: 01/07/2020 Elsevier Patient Education  [place].

## 2020-11-27 NOTE — Progress Notes (Signed)
BP 133/84   Pulse 82   Temp 98.8 F (37.1 C) (Oral)   Wt 281 lb 3.2 oz (127.6 kg)   SpO2 97%   BMI 44.04 kg/m    Subjective:    Patient ID: Lori Knight, female    DOB: 1971-03-25, 50 y.o.   MRN: [id number]  HPI: Lori Knight is a 50 y.o. female  Chief Complaint  Patient presents with  . Dysgeusia    Patient states she has a bad taste as if she is tasting "metal" in her mouth. Patient states this has been an outgoing issue since March and it has gradually gotten worse over the past couple of weeks. Patient states she tested positive for Covid in January. Patient states a friend has told her that she was experiencing same thing and was diagnosed with a gastro issue.    DYSGUESIA Has had ongoing issues with taste of metal in mouth since late March -- started performing better oral care with no benefit.  Notices it all the time, makes herself salivate to get taste out.  History of Covid in January.  Recently started statin, we stopped this with no improvement.  Last went to dentist 6 months ago -- she just signed up with a new dentist and will visit with them upcoming.  No bleeding from gums noticed.    She takes Omeprazole for GERD, 40 MG.  Has never had endoscopy. She reports as long as takes her medication she does not notice heart burn issues.  No supplements at home: no zinc, iron -- does take Centrum, but has this for over a year.   Fever: no Cough: no Shortness of breath: no Wheezing: no Chest pain: no Chest tightness: no Chest congestion: no Nasal congestion: no Runny nose: no Post nasal drip: occasional -- does clear throat a lot Sneezing: no Sore throat: no Swollen glands: no Sinus pressure: no Headache: no Face pain: no Toothache: no Ear pain: none Ear pressure: none Eyes red/itching:no Eye drainage/crusting: no  Vomiting: no Rash: no Fatigue: no   ial history reviewed and updated as indicated. Interim medical history since our last visit  reviewed. Allergies and medications reviewed and updated.  Review of Systems  Constitutional: Negative for activity change, appetite change, diaphoresis, fatigue and fever.  Respiratory: Negative for cough, chest tightness and shortness of breath.   Cardiovascular: Negative for chest pain, palpitations and leg swelling.  Gastrointestinal: Negative.   Endocrine: Negative for cold intolerance, heat intolerance, polydipsia, polyphagia and polyuria.  Neurological: Negative.   Psychiatric/Behavioral: Negative.     Per HPI unless specifically indicated above     Objective:    BP 133/84   Pulse 82   Temp 98.8 F (37.1 C) (Oral)   Wt 281 lb 3.2 oz (127.6 kg)   SpO2 97%   BMI 44.04 kg/m   Wt Readings from Last 3 Encounters:  11/27/20 281 lb 3.2 oz (127.6 kg)  11/05/20 284 lb 12.8 oz (129.2 kg)  10/30/20 279 lb (126.6 kg)    Physical Exam Vitals and nursing note reviewed.  Constitutional:      General: She is awake. She is not in acute distress.    Appearance: She is well-developed and well-groomed. She is morbidly obese. She is not ill-appearing.  HENT:     Head: Normocephalic.     Right Ear: Hearing normal.     Left Ear: Hearing normal.     Mouth/Throat:     Mouth: Mucous membranes are moist.  Dentition: Normal dentition. No gingival swelling.     Tongue: No lesions.     Palate: No mass.     Pharynx: Oropharynx is clear.  Eyes:     General: Lids are normal.        Right eye: No discharge.        Left eye: No discharge.     Conjunctiva/sclera: Conjunctivae normal.     Pupils: Pupils are equal, round, and reactive to light.  Neck:     Thyroid: No thyromegaly.     Vascular: No carotid bruit.  Cardiovascular:     Rate and Rhythm: Normal rate and regular rhythm.     Heart sounds: Normal heart sounds. No murmur heard. No gallop.   Pulmonary:     Effort: Pulmonary effort is normal. No accessory muscle usage or respiratory distress.     Breath sounds: Normal breath  sounds.  Abdominal:     General: Bowel sounds are normal.     Palpations: Abdomen is soft.  Musculoskeletal:     Cervical back: Normal range of motion and neck supple.     Right lower leg: No edema.     Left lower leg: No edema.  Lymphadenopathy:     Cervical: No cervical adenopathy.  Skin:    General: Skin is warm and dry.  Neurological:     Mental Status: She is alert and oriented to person, place, and time.  Psychiatric:        Attention and Perception: Attention normal.        Mood and Affect: Mood normal.        Speech: Speech normal.        Behavior: Behavior normal. Behavior is cooperative.        Thought Content: Thought content normal.    Results for orders placed or performed in visit on 11/05/20  Lamotrigine level  Result Value Ref Range   Lamotrigine Lvl 2.6 2.0 - 20.0 ug/mL  HIV Antibody (routine testing w rflx)  Result Value Ref Range   HIV Screen 4th Generation wRfx Non Reactive Non Reactive      Assessment & Plan:   Problem List Items Addressed This Visit      Digestive   Gastroesophageal reflux disease without esophagitis - Primary    Chronic, ongoing with current dysgeusia -- ?Knight disease vs GERD related.  Low suspicion for Covid related, but possibility due to symptoms starting in March and Covid in January.  Continue Omeprazole 40 MG daily and will get into GI for further assessment.  May benefit from endoscopy, has not had.  Return as scheduled.      Relevant Orders   Ambulatory referral to Gastroenterology    Other Visit Diagnoses    Dysgeusia       Refer to GERD plan of care.   Relevant Orders   Ambulatory referral to Gastroenterology       Follow up plan: Return for as scheduled.

## 2020-11-28 ENCOUNTER — Ambulatory Visit (INDEPENDENT_AMBULATORY_CARE_PROVIDER_SITE_OTHER): Payer: 59 | Admitting: Psychology

## 2020-11-28 ENCOUNTER — Encounter (INDEPENDENT_AMBULATORY_CARE_PROVIDER_SITE_OTHER): Payer: Self-pay | Admitting: Bariatrics

## 2020-11-28 DIAGNOSIS — F3189 Other bipolar disorder: Secondary | ICD-10-CM

## 2020-12-01 ENCOUNTER — Encounter: Payer: Self-pay | Admitting: *Deleted

## 2020-12-01 NOTE — Telephone Encounter (Signed)
Please review

## 2020-12-01 NOTE — Telephone Encounter (Signed)
Call pt. See message. What is she talking about?

## 2020-12-01 NOTE — Telephone Encounter (Signed)
Pt last seen by Dr. Brown.  

## 2020-12-02 ENCOUNTER — Telehealth (INDEPENDENT_AMBULATORY_CARE_PROVIDER_SITE_OTHER): Payer: 59 | Admitting: Psychology

## 2020-12-08 ENCOUNTER — Other Ambulatory Visit: Payer: Self-pay | Admitting: Nurse Practitioner

## 2020-12-08 DIAGNOSIS — I10 Essential (primary) hypertension: Secondary | ICD-10-CM

## 2020-12-16 ENCOUNTER — Other Ambulatory Visit: Payer: Self-pay | Admitting: Nurse Practitioner

## 2020-12-16 ENCOUNTER — Ambulatory Visit (INDEPENDENT_AMBULATORY_CARE_PROVIDER_SITE_OTHER): Payer: 59 | Admitting: Bariatrics

## 2020-12-16 ENCOUNTER — Other Ambulatory Visit: Payer: Self-pay

## 2020-12-16 ENCOUNTER — Encounter (INDEPENDENT_AMBULATORY_CARE_PROVIDER_SITE_OTHER): Payer: Self-pay | Admitting: Bariatrics

## 2020-12-16 VITALS — BP 131/86 | HR 91 | Temp 98.6°F | Ht 67.0 in | Wt 281.0 lb

## 2020-12-16 DIAGNOSIS — F3289 Other specified depressive episodes: Secondary | ICD-10-CM | POA: Diagnosis not present

## 2020-12-16 DIAGNOSIS — I152 Hypertension secondary to endocrine disorders: Secondary | ICD-10-CM

## 2020-12-16 DIAGNOSIS — F5089 Other specified eating disorder: Secondary | ICD-10-CM

## 2020-12-16 DIAGNOSIS — E1129 Type 2 diabetes mellitus with other diabetic kidney complication: Secondary | ICD-10-CM

## 2020-12-16 DIAGNOSIS — Z6841 Body Mass Index (BMI) 40.0 and over, adult: Secondary | ICD-10-CM

## 2020-12-16 DIAGNOSIS — E1159 Type 2 diabetes mellitus with other circulatory complications: Secondary | ICD-10-CM | POA: Diagnosis not present

## 2020-12-16 DIAGNOSIS — R809 Proteinuria, unspecified: Secondary | ICD-10-CM

## 2020-12-16 MED ORDER — TOPIRAMATE 25 MG PO TABS: 25.0000 mg | ORAL_TABLET | Freq: Two times a day (BID) | ORAL | 0 refills | Status: DC

## 2020-12-16 NOTE — Progress Notes (Signed)
Office: [phone]  /  Fax: [phone]    Date: December 30, 2020   Appointment Start Time: 3:50pm Duration: 35 minutes Provider: Glennie Isle, Psy.D. Type of Session: Individual Therapy  Location of Patient: Home Location of Provider: Provider's home (private office) Type of Contact: Telepsychological Visit via MyChart Video Visit  Session Content: Lori Knight is a 50 y.o. female presenting for a follow-up appointment to address the previously established treatment goal of increasing coping skills. Today's appointment was a telepsychological visit due to COVID-19. Lori Knight provided verbal consent for today's telepsychological appointment and she is aware she is responsible for securing confidentiality on her end of the session. Prior to proceeding with today's appointment, Lori Knight's physical location at the time of this appointment was obtained as well a phone number she could be reached at in the event of technical difficulties. Lori Knight and this provider participated in today's telepsychological service.   This provider conducted a brief check-in. Lori Knight stated, "I was just so depressed and I had to acknowledged how depressed I was." Nevertheless, she reported she is feeling "one hundred thousand percent better." Lori Knight stated she was prescribed Topamax by Dr. Owens Shark and described it as helpful. Denied experiencing mania related symptoms. Lori Knight further shared having a "deep dive" through her medications with a pharmacist to increase understanding of medications, interactions, side effects etc. She noted, "I feel more at ease." Lori Knight also reported having a positive experience with her new therapist per the referral placed with [facility].   Regarding eating, Lori Knight reported an improvement. She recalled deviating from her calorie/protein goals yesterday due to one meal, but discussed getting back on track today. Positive reinforcement was provided. Psychoeducation regarding mindfulness was  provided. A handout was provided to Lori Knight with further information regarding mindfulness, including exercises. This provider also explained the benefit of mindfulness as it relates to emotional eating. Lori Knight was encouraged to engage in the provided exercises between now and the next appointment with this provider. Lori Knight agreed. During today's appointment, Lori Knight was led through a mindfulness exercise involving her senses. Lori Knight provided verbal consent during today's appointment for this provider to send a handout about mindfulness via e-mail. Lori Knight was receptive to today's appointment as evidenced by openness to sharing, responsiveness to feedback, and willingness to engage in mindfulness exercises to assist with coping.   Mental Status Examination:  Appearance: well groomed and appropriate hygiene  Behavior: appropriate to circumstances Mood: euthymic Affect: mood congruent Speech: normal in rate, volume, and tone Eye Contact: appropriate Psychomotor Activity: unable to assess  Gait: unable to assess Thought Process: linear, logical, and goal directed  Thought Content/Perception: no hallucinations, delusions, bizarre thinking or behavior reported or observed and denies suicidal and homicidal ideation, plan, and intent Orientation: time, person, place, and purpose of appointment Memory/Concentration: memory, attention, language, and fund of knowledge intact  Insight/Judgment: fair  Interventions:  Conducted a brief chart review Provided empathic reflections and validation Provided positive reinforcement Employed supportive psychotherapy interventions to facilitate reduced distress and to improve coping skills with identified stressors Employed insight oriented and cognitive psychotherapy interventions to identify and modify anxiety/mood producing thoughts, beliefs, and negative self-appraisals contributing to distress and emotional eating Psychoeducation provided regarding mindfulness Engaged  patient in mindfulness exercise(s)  DSM-5 Diagnosis(es): F31.89 Other Specified Bipolar and Related Disorder, Emotional Eating Behaviors   Treatment Goal & Progress: During the initial appointment with this provider, the following treatment goal was established: increase coping skills. Lori Knight has demonstrated progress in her goal as evidenced by increased awareness  of hunger patterns and increased awareness of triggers for emotional eating behaviors. Lori Knight also continues to demonstrate willingness to engage in learned skill(s).  Plan: Lori Knight reported her next appointment with [name] is on January 07, 2021 and her next appointment with her psychiatrist is on January 08, 2021. The next appointment with this provider will be scheduled in 3-4 weeks, which will be via MyChart Video Visit. The next session will focus on working towards the established treatment goal.

## 2020-12-17 DIAGNOSIS — E039 Hypothyroidism, unspecified: Secondary | ICD-10-CM

## 2020-12-17 DIAGNOSIS — E1169 Type 2 diabetes mellitus with other specified complication: Secondary | ICD-10-CM

## 2020-12-17 DIAGNOSIS — E1159 Type 2 diabetes mellitus with other circulatory complications: Secondary | ICD-10-CM

## 2020-12-17 DIAGNOSIS — I152 Hypertension secondary to endocrine disorders: Secondary | ICD-10-CM

## 2020-12-19 ENCOUNTER — Ambulatory Visit (INDEPENDENT_AMBULATORY_CARE_PROVIDER_SITE_OTHER): Payer: 59 | Admitting: Psychology

## 2020-12-19 DIAGNOSIS — F3189 Other bipolar disorder: Secondary | ICD-10-CM

## 2020-12-23 ENCOUNTER — Encounter (INDEPENDENT_AMBULATORY_CARE_PROVIDER_SITE_OTHER): Payer: Self-pay | Admitting: Bariatrics

## 2020-12-23 NOTE — Progress Notes (Signed)
Chief Complaint:   OBESITY Lori Knight is here to discuss her progress with her obesity treatment plan along with follow-up of her obesity related diagnoses. Lori Knight is on keeping a food journal and adhering to recommended goals of 1700 calories and 90 grams of protein daily and states she is following her eating plan approximately 20% of the time. Lori Knight states she is on the treadmill, and doing physical therapy for 10-60 minutes 2-4 times per week.  Today's visit was #: 5 Starting weight: 289 lbs Starting date: 09/11/2020 Today's weight: 281 lbs Today's date: 12/16/2020 Total lbs lost to date: 8 Total lbs lost since last in-office visit: 0  Interim History: Lori Knight is up 2 lbs. She is having issues with her mood. She is cravings sweets all day.  Subjective:   1. Hypertension associated with diabetes (Lori Knight) Lori Knight's blood pressure is reasonably well controlled.  2. Type 2 diabetes mellitus with proteinuria (Lori Knight) Lori Knight is taking metformin and Dulaglutide.  3. Other depression Lori Knight had seen Dr. Mallie Knight, our Bariatric Psychologist but cancelled. She is seeing a Lori Knight.  4. Other disorder of eating Lori Knight is not on medications currently.  Assessment/Plan:   1. Hypertension associated with diabetes (Lori Knight) Lori Knight will continue her medications, and will continue working on healthy weight loss and exercise to improve blood pressure control. We will watch for signs of hypotension as she continues her lifestyle modifications.  2. Type 2 diabetes mellitus with proteinuria (Lori Knight) Lori Knight will continue her medications and will continue working on diet and exercise. [name], neuropathy, limb loss, blindness, coronary artery disease, and death. Intensive lifestyle modification including diet, exercise and weight loss are the first line of treatment for diabetes.   3. Other  depression Behavior modification techniques were discussed today to help Lori Knight deal with her depression. Lori Knight will follow up with her Psychiatrist and counselor. Orders and follow up as documented in patient record.   4. Other disorder of eating Behavior modification techniques were discussed today to help Lori Knight deal with her emotional/non-hunger eating behaviors. Lori Knight agreed to start Topamax 25 mg BID with meals, with no refills. Orders and follow up as documented in patient record.   - topiramate (TOPAMAX) 25 MG tablet; Take 1 tablet (25 mg total) by mouth 2 (two) times daily.  Dispense: 60 tablet; Refill: 0  5. Obesity with current BMI of 44.0 Lori Knight is currently in the action stage of change. As such, her goal is to continue with weight loss efforts. She has agreed to the Category 3 Plan or keeping a food journal and adhering to recommended goals of 1700 calories and 90 grams of protein daily.   Mindful eating was discussed. Lori Knight will adhere strongly to the meal plan.  Exercise goals: As is.  Behavioral modification strategies: increasing lean protein intake, decreasing simple carbohydrates, increasing vegetables, increasing water intake, decreasing eating out, no skipping meals, meal planning and cooking strategies, keeping healthy foods in the home, and planning for success.  Lori Knight has agreed to follow-up with our clinic in 2 weeks. She was informed of the importance of frequent follow-up visits to maximize her success with intensive lifestyle modifications for her multiple health conditions.   Objective:   Blood pressure 131/86, pulse 91, temperature 98.6 F (37 C), height 5\' 7"  (1.702 m), weight 281 lb (127.5 kg), SpO2 96 %. Body mass index is 44.01 kg/m.  General: Cooperative, alert, well developed,  in no acute distress. HEENT: Conjunctivae and lids unremarkable. Cardiovascular: Regular rhythm.  Lungs: Normal work of breathing. Neurologic: No focal deficits.   Lab Results   Component Value Date   CREATININE 0.74 09/11/2020   BUN 12 09/11/2020   NA 140 09/11/2020   K 4.5 09/11/2020   CL 100 09/11/2020   CO2 24 09/11/2020   Lab Results  Component Value Date   ALT 33 (H) 09/11/2020   AST 20 09/11/2020   ALKPHOS 122 (H) 09/11/2020   BILITOT 0.2 09/11/2020   Lab Results  Component Value Date   HGBA1C 6.7% 03/19/2020   HGBA1C 7.8 05/31/2019   HGBA1C 7.8 05/31/2019   HGBA1C 8.0 07/17/2018   HGBA1C 8.5 (H) 04/17/2018   Lab Results  Component Value Date   INSULIN 28.7 (H) 09/11/2020   Lab Results  Component Value Date   TSH 0.396 (L) 09/11/2020   Lab Results  Component Value Date   CHOL 237 (H) 09/11/2020   HDL 43 09/11/2020   LDLCALC 147 (H) 09/11/2020   TRIG 255 (H) 09/11/2020   Lab Results  Component Value Date   WBC 7.0 07/13/2020   HGB 13.4 07/13/2020   HCT 41.8 07/13/2020   MCV 85.7 07/13/2020   PLT 251 07/13/2020   Lab Results  Component Value Date   IRON 57 12/20/2018   TIBC 388 12/20/2018   FERRITIN 122 12/20/2018   Attestation Statements:   Reviewed by clinician on day of visit: allergies, medications, problem list, medical history, surgical history, family history, social history, and previous encounter notes.   Wilhemena Durie, am acting as Location manager for [employer], DO.  I have reviewed the above documentation for accuracy and completeness, and I agree with the above. Jearld Lesch, DO

## 2020-12-24 ENCOUNTER — Telehealth: Payer: Self-pay | Admitting: General Practice

## 2020-12-25 ENCOUNTER — Ambulatory Visit: Payer: Self-pay | Admitting: General Practice

## 2020-12-25 DIAGNOSIS — E1159 Type 2 diabetes mellitus with other circulatory complications: Secondary | ICD-10-CM

## 2020-12-25 DIAGNOSIS — F3289 Other specified depressive episodes: Secondary | ICD-10-CM

## 2020-12-25 DIAGNOSIS — I152 Hypertension secondary to endocrine disorders: Secondary | ICD-10-CM

## 2020-12-25 DIAGNOSIS — E1169 Type 2 diabetes mellitus with other specified complication: Secondary | ICD-10-CM

## 2020-12-25 DIAGNOSIS — F309 Manic episode, unspecified: Secondary | ICD-10-CM

## 2020-12-25 NOTE — Patient Instructions (Signed)
Visit Information  PATIENT GOALS:  Patient Care Plan: RNCM: Depression (Adult)     Problem Identified: RNCM: Depression Identification (Depression)   Priority: Medium     Long-Range Goal: RNCM: Depression and Bipolar   Priority: Medium  Note:   Current Barriers:  Ineffective Self Health Maintenance  Unable to independently manage depression and bipolar disorder effectively  Clinical Goal(s):  Collaboration with [name], NP regarding development and update of comprehensive plan of care as evidenced by provider attestation and co-signature Inter-disciplinary care team collaboration (see longitudinal plan of care) patient will work with care management team to address care coordination and chronic disease management needs related to Disease Management Educational Needs Care Coordination Medication Management and Education Medication Reconciliation Psychosocial Support Mental Health Counseling   Interventions:  Evaluation of current treatment plan related to Depression and Bipolar Disorder, Level of care concerns and medication questions  self-management and patient's adherence to plan as established by provider. The patient is working with a Social worker and a psychiatrist that she likes and feels is sufficient at this time. Did talk to the patient about the availability of LCSW and the patient knows the support is available.  Collaboration with [name], NP regarding development and update of comprehensive plan of care as evidenced by provider attestation       and co-signature Inter-disciplinary care team collaboration (see longitudinal plan of care) Discussed plans with patient for ongoing care management follow up and provided patient with direct contact information for care management team Referral placed today for [facility] central pharmacy support for questions the patient has about side effects and effective medications management.  Self Care Activities:  Patient  verbalizes understanding of plan to effectively manage depression and bipolar  Self administers medications as prescribed Attends all scheduled provider appointments Calls pharmacy for medication refills Performs ADL's independently Performs IADL's independently Calls provider office for new concerns or questions Work with the CCM team and [place] for assistance with Medication questions/side effects/reconciliation Patient Goals: - anxiety screen reviewed - depression screen reviewed - medication list reviewed - participation in psychiatric services encouraged - substance use assessed Follow Up Plan: Telephone follow up appointment with care management team member scheduled for: 01-16-2021 at 0900 am    Task: RNCM: Identify Depressive Symptoms and Facilitate Treatment   Note:   Care Management Activities:    - anxiety screen reviewed - depression screen reviewed - medication list reviewed - participation in psychiatric services encouraged - substance use assessed      Patient Care Plan: RNCM: Diabetes Type 2 (Adult)     Problem Identified: RNCM: Glycemic Management (Diabetes, Type 2)   Priority: Medium     Long-Range Goal: RNCM: Glycemic Management Optimized   Priority: Medium  Note:   Objective:  Lab Results  Component Value Date   HGBA1C 6.7% 03/19/2020   Lab Results  Component Value Date   CREATININE 0.74 09/11/2020   CREATININE 0.91 02/20/2020   CREATININE 0.8 05/31/2019   Lab Results  Component Value Date   EGFR 98 09/11/2020   Current Barriers:  Knowledge Deficits related to basic Diabetes pathophysiology and self care/management Knowledge Deficits related to medications used for management of diabetes Wants to understand all her medications and how they may interact with each other and side effect concerns.  Unable to independently manage fasting blood sugar levels. States always has fasting blood sugars of 160-200 in the am, lower in the day.   Case Manager Clinical Goal(s):  patient will  demonstrate improved adherence to prescribed treatment plan for diabetes self care/management as evidenced by: daily monitoring and recording of CBG  adherence to ADA/ carb modified diet exercise 4/5 days/week adherence to prescribed medication regimen contacting provider for new or worsened symptoms or questions Interventions:  Collaboration with [name], NP regarding development and update of comprehensive plan of care as evidenced by provider attestation and co-signature Inter-disciplinary care team collaboration (see longitudinal plan of care) Provided education to patient about basic DM disease process Reviewed medications with patient and discussed importance of medication adherence Discussed plans with patient for ongoing care management follow up and provided patient with direct contact information for care management team Provided patient with written educational materials related to hypo and hyperglycemia and importance of correct treatment Reviewed scheduled/upcoming provider appointments including: 02-04-2021 at 2 pm Advised patient, providing education and rationale, to check cbg daily and record, calling pcp for findings outside established parameters.   Referral made to pharmacy team for assistance with medications management, questions about interactions and side effects. Referral placed to [facility] central pharmacist team for assistance with the patient on questions about medications.  Review of patient status, including review of consultants reports, relevant laboratory and other test results, and medications completed. Self-Care Activities - UNABLE to independently manage DM as evidence of fasting blood sugars of 160-200 and hemoglobin A1C of 6.8 on 11-04-2020 Self administers oral medications as prescribed Self administers injectable DM medication (dulglutide) as prescribed Attends all scheduled provider appointments Checks blood  sugars as prescribed and utilize hyper and hypoglycemia protocol as needed Adheres to prescribed ADA/carb modified Patient Goals: - check blood sugar at prescribed times - check blood sugar before and after exercise - check blood sugar if I feel it is too high or too low - enter blood sugar readings and medication or insulin into daily log - take the blood sugar log to all doctor visits - change to whole grain breads, cereal, pasta - set goal weight - drink 6 to 8 glasses of water each day - eat fish at least once per week - fill half of plate with vegetables - limit fast food meals to no more than 1 per week - manage portion size - prepare main meal at home 3 to 5 days each week - read food labels for fat, fiber, carbohydrates and portion size - set a realistic goal - schedule appointment with eye doctor - check feet daily for cuts, sores or redness - keep feet up while sitting - trim toenails straight across - wash and dry feet carefully every day - wear comfortable, cotton socks - wear comfortable, well-fitting shoes - barriers to adherence to treatment plan identified - blood glucose monitoring encouraged - blood glucose readings reviewed - self-awareness of signs/symptoms of hypo or hyperglycemia encouraged - use of blood glucose monitoring log promoted Follow Up Plan: Telephone follow up appointment with care management team member scheduled for: 01-16-2021 at 0900 am     Task: RNCM: Alleviate Barriers to Glycemic Management   Note:   Care Management Activities:    - barriers to adherence to treatment plan identified - blood glucose monitoring encouraged - blood glucose readings reviewed - self-awareness of signs/symptoms of hypo or hyperglycemia encouraged - use of blood glucose monitoring log promoted        Patient Care Plan: RNCM: Hypertension (Adult)     Problem Identified: RNCM: Hypertension (Hypertension)   Priority: Medium     Long-Range Goal: RNCM:  Hypertension Monitored  Priority: Medium  Note:   Objective:  Last practice recorded BP readings:  BP Readings from Last 3 Encounters:  12/16/20 131/86  11/27/20 133/84  11/05/20 120/82   Most recent eGFR/CrCl:  Lab Results  Component Value Date   EGFR 98 09/11/2020    No components found for: CRCL Current Barriers:  Knowledge Deficits related to basic understanding of hypertension pathophysiology and self care management Knowledge Deficits related to understanding of medications prescribed for management of hypertension Unable to independently HTN Case Manager Clinical Goal(s):  patient will verbalize understanding of plan for hypertension management patient will attend all scheduled medical appointments: 02-04-2021 at 2 pm patient will demonstrate improved adherence to prescribed treatment plan for hypertension as evidenced by taking all medications as prescribed, monitoring and recording blood pressure as directed, adhering to low sodium/DASH diet patient will demonstrate improved health management independence as evidenced by checking blood pressure as directed and notifying PCP if SBP>150 or DBP > 90, taking all medications as prescribe, and adhering to a low sodium diet as discussed. patient will verbalize basic understanding of hypertension disease process and self health management plan as evidenced by compliance with medications, compliance with heart healthy/ADA diet, and working with CCM team to effectively manage health and well being.  Interventions:  Collaboration with [name], NP regarding development and update of comprehensive plan of care as evidenced by provider attestation and co-signature Inter-disciplinary care team collaboration (see longitudinal plan of care) Evaluation of current treatment plan related to hypertension self management and patient's adherence to plan as established by provider. Wants to know if the next script for Cozaar 50 mg can be put  in for 50 mg tablets not 100 mg tablets. Says the insurance should pay for this now.  Is concerned as she cannot always split them accurately. Will collaborate with the pcp.  Provided education to patient re: stroke prevention, s/s of heart attack and stroke, DASH diet, complications of uncontrolled blood pressure Reviewed medications with patient and discussed importance of compliance Discussed plans with patient for ongoing care management follow up and provided patient with direct contact information for care management team Advised patient, providing education and rationale, to monitor blood pressure daily and record, calling PCP for findings outside established parameters.  Reviewed scheduled/upcoming provider appointments including: 02-04-2021 at 2 pm Self-Care Activities: - Self administers medications as prescribed Attends all scheduled provider appointments Calls provider office for new concerns, questions, or BP outside discussed parameters Checks BP and records as discussed Follows a low sodium diet/DASH diet Patient Goals: - check blood pressure weekly - choose a place to take my blood pressure (home, clinic or office, retail store) - agree on reward when goals are met - agree to work together to make changes - ask questions to understand - have a family meeting to talk about healthy habits - learn about high blood pressure - blood pressure equipment and technique reviewed - blood pressure trends reviewed - depression screen reviewed - home or ambulatory blood pressure monitoring encouraged  Follow Up Plan: Telephone follow up appointment with care management team member scheduled for: 01-16-2021 at 0900 am    Task: RNCM: Identify and Monitor Blood Pressure Elevation   Note:   Care Management Activities:    - blood pressure equipment and technique reviewed - blood pressure trends reviewed - depression screen reviewed - home or ambulatory blood pressure monitoring  encouraged           Ms. Heinbaugh was given information about  Care Management services by the embedded care coordination team including:  Care Management services include personalized support from designated clinical staff supervised by her physician, including individualized plan of care and coordination with other care providers 24/7 contact phone numbers for assistance for urgent and routine care needs. The patient may stop CCM services at any time (effective at the end of the month) by phone call to the office staff.  Patient agreed to services and verbal consent obtained.   Patient verbalizes understanding of instructions provided today and agrees to view in [place].   Telephone follow up appointment with care management team member scheduled for: 01-16-2021 at 0900 am  Noreene Larsson RN, MSN, [facility] Mobile: [phone]  Diabetes Mellitus and Nutrition, Adult When you have diabetes, or diabetes mellitus, it is very important to have healthy eating habits because your blood sugar (glucose) levels are greatly affected by what you eat and drink. Eating healthy foods in the right amounts, at about the same times every day, can help you: Control your blood glucose. Lower your risk of heart disease. Improve your blood pressure. Reach or maintain a healthy weight. What can affect my meal plan? Every person with diabetes is different, and each person has different needs for a meal plan. Your health care provider may recommend that you work with a dietitian to make a meal plan that is best for you. Your meal plan may vary depending on factors such as: The calories you need. The medicines you take. Your weight. Your blood glucose, blood pressure, and cholesterol levels. Your activity level. Other health conditions you have, such as heart or kidney disease. How do carbohydrates affect me? Carbohydrates,  also called carbs, affect your blood glucose level more than any other type of food. Eating carbs naturally raises the amount of glucose in your blood. Carb counting is a method for keeping track of how many carbs you eat. Counting carbs is important to keep your blood glucose at a healthy level,especially if you use insulin or take certain oral diabetes medicines. It is important to know how many carbs you can safely have in each meal. This is different for every person. Your dietitian can help you calculate how manycarbs you should have at each meal and for each snack. How does alcohol affect me? Alcohol can cause a sudden decrease in blood glucose (hypoglycemia), especially if you use insulin or take certain oral diabetes medicines. Hypoglycemia can be a life-threatening condition. Symptoms of hypoglycemia, such as sleepiness, dizziness, and confusion, are similar to symptoms of having too much alcohol. Do not drink alcohol if: Your health care provider tells you not to drink. You are pregnant, may be pregnant, or are planning to become pregnant. If you drink alcohol: Do not drink on an empty stomach. Limit how much you use to: 0-1 drink a day for women. 0-2 drinks a day for men. Be aware of how much alcohol is in your drink. In the [place] one drink equals one 12 oz bottle of beer (355 mL), one 5 oz glass of wine (148 mL), or one 1 oz glass of hard liquor (44 mL). Keep yourself hydrated with water, diet soda, or unsweetened iced tea. Keep in mind that regular soda, juice, and other mixers may contain a lot of sugar and must be counted as carbs. What are tips for following this plan?  Reading food labels Start by checking the serving size on the "  Nutrition Facts" label of packaged foods and drinks. The amount of calories, carbs, fats, and other nutrients listed on the label is based on one serving of the item. Many items contain more than one serving per package. Check the total grams (g) of  carbs in one serving. You can calculate the number of servings of carbs in one serving by dividing the total carbs by 15. For example, if a food has 30 g of total carbs per serving, it would be equal to 2 servings of carbs. Check the number of grams (g) of saturated fats and trans fats in one serving. Choose foods that have a low amount or none of these fats. Check the number of milligrams (mg) of salt (sodium) in one serving. Most people should limit total sodium intake to less than 2,300 mg per day. Always check the nutrition information of foods labeled as "low-fat" or "nonfat." These foods may be higher in added sugar or refined carbs and should be avoided. Talk to your dietitian to identify your daily goals for nutrients listed on the label. Shopping Avoid buying canned, pre-made, or processed foods. These foods tend to be high in fat, sodium, and added sugar. Shop around the outside edge of the grocery store. This is where you will most often find fresh fruits and vegetables, bulk grains, fresh meats, and fresh dairy. Cooking Use low-heat cooking methods, such as baking, instead of high-heat cooking methods like deep frying. Cook using healthy oils, such as olive, canola, or sunflower oil. Avoid cooking with butter, cream, or high-fat meats. Meal planning Eat meals and snacks regularly, preferably at the same times every day. Avoid going long periods of time without eating. Eat foods that are high in fiber, such as fresh fruits, vegetables, beans, and whole grains. Talk with your dietitian about how many servings of carbs you can eat at each meal. Eat 4-6 oz (112-168 g) of lean protein each day, such as lean meat, chicken, fish, eggs, or tofu. One ounce (oz) of lean protein is equal to: 1 oz (28 g) of meat, chicken, or fish. 1 egg.  cup (62 g) of tofu. Eat some foods each day that contain healthy fats, such as avocado, nuts, seeds, and fish. What foods should I eat? Fruits Berries.  Apples. Oranges. Peaches. Apricots. Plums. Grapes. Mango. Papaya.Pomegranate. Kiwi. Cherries. Vegetables Lettuce. Spinach. Leafy greens, including kale, chard, collard greens, and mustard greens. Beets. Cauliflower. Cabbage. Broccoli. Carrots. Green beans.Tomatoes. Peppers. Onions. Cucumbers. Brussels sprouts. Grains Whole grains, such as whole-wheat or whole-grain bread, crackers, tortillas,cereal, and pasta. Unsweetened oatmeal. Quinoa. Brown or wild rice. Meats and other proteins Seafood. Poultry without skin. Lean cuts of poultry and beef. Tofu. Nuts. Seeds. Dairy Low-fat or fat-free dairy products such as milk, yogurt, and cheese. The items listed above may not be a complete list of foods and beverages you can eat. Contact a dietitian for more information. What foods should I avoid? Fruits Fruits canned with syrup. Vegetables Canned vegetables. Frozen vegetables with butter or cream sauce. Grains Refined white flour and flour products such as bread, pasta, snack foods, andcereals. Avoid all processed foods. Meats and other proteins Fatty cuts of meat. Poultry with skin. Breaded or fried meats. Processed meat.Avoid saturated fats. Dairy Full-fat yogurt, cheese, or milk. Beverages Sweetened drinks, such as soda or iced tea. The items listed above may not be a complete list of foods and beverages you should avoid. Contact a dietitian for more information. Questions to ask a health care provider  Do I need to meet with a diabetes educator? Do I need to meet with a dietitian? What number can I call if I have questions? When are the best times to check my blood glucose? Where to find more information: American Diabetes Association: diabetes.org Academy of Nutrition and Dietetics: www.eatright.[employer] of Diabetes and Digestive and Kidney Diseases: [employer] Association of Diabetes Care and Education Specialists: www.diabeteseducator.org Summary It is important  to have healthy eating habits because your blood sugar (glucose) levels are greatly affected by what you eat and drink. A healthy meal plan will help you control your blood glucose and maintain a healthy lifestyle. Your health care provider may recommend that you work with a dietitian to make a meal plan that is best for you. Keep in mind that carbohydrates (carbs) and alcohol have immediate effects on your blood glucose levels. It is important to count carbs and to use alcohol carefully. This information is not intended to replace advice given to you by your health care provider. Make sure you discuss any questions you have with your healthcare provider. Document Revised: 06/05/2019 Document Reviewed: 06/05/2019 Elsevier Patient Education  2021 [employer].

## 2020-12-25 NOTE — Telephone Encounter (Signed)
  Care Management   Follow Up Note   12/25/2020 Name: Lori Knight MRN: [id number] DOB: [birth year]   Referred by: [name], NP Reason for referral : Care Coordination (RNCM: Initial Outreach for Chronic Disease Management and Care Coordination Needs )  Late entry:  An unsuccessful telephone outreach was attempted today. The patient was referred to the case management team for assistance with care management and care coordination.   Follow Up Plan: Telephone follow up appointment with care management team member scheduled for: 01-16-2021  Noreene Larsson RN, MSN, [facility] Mobile: [phone]

## 2020-12-25 NOTE — Chronic Care Management (AMB) (Signed)
Care Management    RN Visit Note  12/25/2020 Name: Lori Knight MRN: [id number] DOB: 10/27/1970  Subjective: Lori Knight is a 50 y.o. year old female who is a primary care patient of Cannady, Lori Faster, NP. The care management team was consulted for assistance with disease management and care coordination needs.    Engaged with patient by telephone for initial visit in response to provider referral for case management and/or care coordination services.   Consent to Services:   [name] was given information about Care Management services today including:  Care Management services includes personalized support from designated clinical staff supervised by her physician, including individualized plan of care and coordination with other care providers 24/7 contact phone numbers for assistance for urgent and routine care needs. The patient may stop case management services at any time by phone call to the office staff.  Patient agreed to services and consent obtained.   Assessment: Review of patient past medical history, allergies, medications, health status, including review of consultants reports, laboratory and other test data, was performed as part of comprehensive evaluation and provision of chronic care management services.   SDOH (Social Determinants of Health) assessments and interventions performed:    Care Plan  Allergies  Allergen Reactions   Hctz [Hydrochlorothiazide] Cough   Lisinopril Cough   Latex Rash    Condoms only   Prednisone Anxiety    Paranoia    Outpatient Encounter Medications as of 12/25/2020  Medication Sig Note   acetaminophen (TYLENOL) 500 MG tablet Take 500 mg by mouth every 6 (six) hours as needed.    albuterol (VENTOLIN HFA) 108 (90 Base) MCG/ACT inhaler Inhale 2 puffs into the lungs every 4 (four) hours as needed for wheezing or shortness of breath.    amLODipine (NORVASC) 5 MG tablet TAKE 1 TABLET BY MOUTH EVERY DAY    Ascorbic Acid  (VITAMIN C) 1000 MG tablet Take 1,000 mg by mouth daily.    atorvastatin (LIPITOR) 20 MG tablet Take 20 mg by mouth daily.    Cholecalciferol 5000 units capsule Take 1 capsule (5,000 Units total) by mouth daily.    CONTOUR NEXT TEST test strip USE TO CHECK BS UP TO 3 TIMES DAILY FOR DIABETES DX: E11.9    Dulaglutide 1.5 MG/0.5ML SOPN Inject 1.5 mg into the skin once a week.    lamoTRIgine (LAMICTAL) 150 MG tablet Take 250 mg by mouth daily.    levothyroxine (SYNTHROID, LEVOTHROID) 175 MCG tablet TAKE 1 TABLET (175 MCG TOTAL) BY MOUTH DAILY. 12/25/2020: Takes 2 on Sunday   losartan (COZAAR) 100 MG tablet TAKE 1/2 TABLET (50 MG TOTAL) BY MOUTH 2 (TWO) TIMES DAILY.    metFORMIN (GLUCOPHAGE) 500 MG tablet Take 1 tablet (500 mg total) by mouth 3 (three) times daily. (Patient taking differently: Take 1,000 mg by mouth 2 (two) times daily.)    Omega-3 Fatty Acids (FISH OIL) 1200 MG CAPS Take 1,200 mg by mouth daily.     omeprazole (PRILOSEC) 40 MG capsule Take 1 capsule (40 mg total) by mouth daily.    ondansetron (ZOFRAN ODT) 4 MG disintegrating tablet Take 1 tablet (4 mg total) by mouth every 8 (eight) hours as needed for nausea or vomiting.    topiramate (TOPAMAX) 25 MG tablet Take 1 tablet (25 mg total) by mouth 2 (two) times daily.    No facility-administered encounter medications on file as of 12/25/2020.    Patient Active Problem List   Diagnosis Date Noted   Aortic  atherosclerosis ([place]) 09/30/2020   Type 2 diabetes mellitus with proteinuria ([place]) 02/20/2020   Meningioma ([place]) 12/24/2019   Hyperlipidemia associated with type 2 diabetes mellitus ([place]) 08/13/2019   Gastroesophageal reflux disease without esophagitis 06/20/2019   Cough in adult patient 12/26/2017   Type 2 diabetes mellitus with morbid obesity ([place]) 12/01/2015   OSA on CPAP 09/22/2015   Insomnia 08/28/2015   Internal hemorrhoids 08/28/2015   Vitamin D deficiency 08/28/2015   Obesity, Class III, BMI 40-49.9 (morbid obesity)  ([place]) 08/27/2015   Hypertension associated with diabetes ([place]) 02/14/2014   Benign paroxysmal positional nystagmus 02/14/2014   Benign neoplasm of kidney 08/14/2012   Bipolar I disorder, single manic episode ([place]) 08/14/2012   Adult hypothyroidism 06/21/2012   Bilateral polycystic ovarian syndrome 06/21/2012    Conditions to be addressed/monitored: HTN, DMII, Depression, and Bipolar Disorder  Care Plan : RNCM: Depression (Adult)  Updates made by Vanita Ingles since 12/25/2020 12:00 AM     Problem: RNCM: Depression Identification (Depression)   Priority: Medium     Long-Range Goal: RNCM: Depression and Bipolar   Priority: Medium  Note:   Current Barriers:  Ineffective Self Health Maintenance  Unable to independently manage depression and bipolar disorder effectively  Clinical Goal(s):  Collaboration with Lori Lick, NP regarding development and update of comprehensive plan of care as evidenced by provider attestation and co-signature Inter-disciplinary care team collaboration (see longitudinal plan of care) patient will work with care management team to address care coordination and chronic disease management needs related to Disease Management Educational Needs Care Coordination Medication Management and Education Medication Reconciliation Psychosocial Support Mental Health Counseling   Interventions:  Evaluation of current treatment plan related to Depression and Bipolar Disorder, Level of care concerns and medication questions  self-management and patient's adherence to plan as established by provider. The patient is working with a Social worker and a psychiatrist that she likes and feels is sufficient at this time. Did talk to the patient about the availability of LCSW and the patient knows the support is available.  Collaboration with Lori Lick, NP regarding development and update of comprehensive plan of care as evidenced by provider attestation       and  co-signature Inter-disciplinary care team collaboration (see longitudinal plan of care) Discussed plans with patient for ongoing care management follow up and provided patient with direct contact information for care management team Referral placed today for [facility] central pharmacy support for questions the patient has about side effects and effective medications management.  Self Care Activities:  Patient verbalizes understanding of plan to effectively manage depression and bipolar  Self administers medications as prescribed Attends all scheduled provider appointments Calls pharmacy for medication refills Performs ADL's independently Performs IADL's independently Calls provider office for new concerns or questions Work with the CCM team and [place] for assistance with Medication questions/side effects/reconciliation Patient Goals: - anxiety screen reviewed - depression screen reviewed - medication list reviewed - participation in psychiatric services encouraged - substance use assessed Follow Up Plan: Telephone follow up appointment with care management team member scheduled for: 01-16-2021 at 0900 am    Task: RNCM: Identify Depressive Symptoms and Facilitate Treatment   Note:   Care Management Activities:    - anxiety screen reviewed - depression screen reviewed - medication list reviewed - participation in psychiatric services encouraged - substance use assessed      Care Plan : RNCM: Diabetes Type 2 (Adult)  Updates made by Vanita Ingles since 12/25/2020  12:00 AM     Problem: RNCM: Glycemic Management (Diabetes, Type 2)   Priority: Medium     Long-Range Goal: RNCM: Glycemic Management Optimized   Priority: Medium  Note:   Objective:  Lab Results  Component Value Date   HGBA1C 6.7% 03/19/2020   Lab Results  Component Value Date   CREATININE 0.74 09/11/2020   CREATININE 0.91 02/20/2020   CREATININE 0.8 05/31/2019   Lab Results  Component Value Date    EGFR 98 09/11/2020   Current Barriers:  Knowledge Deficits related to basic Diabetes pathophysiology and self care/management Knowledge Deficits related to medications used for management of diabetes Wants to understand all her medications and how they may interact with each other and side effect concerns.  Unable to independently manage fasting blood sugar levels. States always has fasting blood sugars of 160-200 in the am, lower in the day.  Case Manager Clinical Goal(s):  patient will demonstrate improved adherence to prescribed treatment plan for diabetes self care/management as evidenced by: daily monitoring and recording of CBG  adherence to ADA/ carb modified diet exercise 4/5 days/week adherence to prescribed medication regimen contacting provider for new or worsened symptoms or questions Interventions:  Collaboration with Lori Lick, NP regarding development and update of comprehensive plan of care as evidenced by provider attestation and co-signature Inter-disciplinary care team collaboration (see longitudinal plan of care) Provided education to patient about basic DM disease process Reviewed medications with patient and discussed importance of medication adherence Discussed plans with patient for ongoing care management follow up and provided patient with direct contact information for care management team Provided patient with written educational materials related to hypo and hyperglycemia and importance of correct treatment Reviewed scheduled/upcoming provider appointments including: 02-04-2021 at 2 pm Advised patient, providing education and rationale, to check cbg daily and record, calling pcp for findings outside established parameters.   Referral made to pharmacy team for assistance with medications management, questions about interactions and side effects. Referral placed to [facility] central pharmacist team for assistance with the patient on questions about medications.  Review  of patient status, including review of consultants reports, relevant laboratory and other test results, and medications completed. Self-Care Activities - UNABLE to independently manage DM as evidence of fasting blood sugars of 160-200 and hemoglobin A1C of 6.8 on 11-04-2020 Self administers oral medications as prescribed Self administers injectable DM medication (dulglutide) as prescribed Attends all scheduled provider appointments Checks blood sugars as prescribed and utilize hyper and hypoglycemia protocol as needed Adheres to prescribed ADA/carb modified Patient Goals: - check blood sugar at prescribed times - check blood sugar before and after exercise - check blood sugar if I feel it is too high or too low - enter blood sugar readings and medication or insulin into daily log - take the blood sugar log to all doctor visits - change to whole grain breads, cereal, pasta - set goal weight - drink 6 to 8 glasses of water each day - eat fish at least once per week - fill half of plate with vegetables - limit fast food meals to no more than 1 per week - manage portion size - prepare main meal at home 3 to 5 days each week - read food labels for fat, fiber, carbohydrates and portion size - set a realistic goal - schedule appointment with eye doctor - check feet daily for cuts, sores or redness - keep feet up while sitting - trim toenails straight across - wash and dry feet  carefully every day - wear comfortable, cotton socks - wear comfortable, well-fitting shoes - barriers to adherence to treatment plan identified - blood glucose monitoring encouraged - blood glucose readings reviewed - self-awareness of signs/symptoms of hypo or hyperglycemia encouraged - use of blood glucose monitoring log promoted Follow Up Plan: Telephone follow up appointment with care management team member scheduled for: 01-16-2021 at 0900 am     Task: RNCM: Alleviate Barriers to Glycemic Management   Note:    Care Management Activities:    - barriers to adherence to treatment plan identified - blood glucose monitoring encouraged - blood glucose readings reviewed - self-awareness of signs/symptoms of hypo or hyperglycemia encouraged - use of blood glucose monitoring log promoted        Care Plan : RNCM: Hypertension (Adult)  Updates made by Vanita Ingles since 12/25/2020 12:00 AM     Problem: RNCM: Hypertension (Hypertension)   Priority: Medium     Long-Range Goal: RNCM: Hypertension Monitored   Priority: Medium  Note:   Objective:  Last practice recorded BP readings:  BP Readings from Last 3 Encounters:  12/16/20 131/86  11/27/20 133/84  11/05/20 120/82   Most recent eGFR/CrCl:  Lab Results  Component Value Date   EGFR 98 09/11/2020    No components found for: CRCL Current Barriers:  Knowledge Deficits related to basic understanding of hypertension pathophysiology and self care management Knowledge Deficits related to understanding of medications prescribed for management of hypertension Unable to independently HTN Case Manager Clinical Goal(s):  patient will verbalize understanding of plan for hypertension management patient will attend all scheduled medical appointments: 02-04-2021 at 2 pm patient will demonstrate improved adherence to prescribed treatment plan for hypertension as evidenced by taking all medications as prescribed, monitoring and recording blood pressure as directed, adhering to low sodium/DASH diet patient will demonstrate improved health management independence as evidenced by checking blood pressure as directed and notifying PCP if SBP>150 or DBP > 90, taking all medications as prescribe, and adhering to a low sodium diet as discussed. patient will verbalize basic understanding of hypertension disease process and self health management plan as evidenced by compliance with medications, compliance with heart healthy/ADA diet, and working with CCM team to  effectively manage health and well being.  Interventions:  Collaboration with Lori Lick, NP regarding development and update of comprehensive plan of care as evidenced by provider attestation and co-signature Inter-disciplinary care team collaboration (see longitudinal plan of care) Evaluation of current treatment plan related to hypertension self management and patient's adherence to plan as established by provider. Wants to know if the next script for Cozaar 50 mg can be put in for 50 mg tablets not 100 mg tablets. Says the insurance should pay for this now.  Is concerned as she cannot always split them accurately. Will collaborate with the pcp.  Provided education to patient re: stroke prevention, s/s of heart attack and stroke, DASH diet, complications of uncontrolled blood pressure Reviewed medications with patient and discussed importance of compliance Discussed plans with patient for ongoing care management follow up and provided patient with direct contact information for care management team Advised patient, providing education and rationale, to monitor blood pressure daily and record, calling PCP for findings outside established parameters.  Reviewed scheduled/upcoming provider appointments including: 02-04-2021 at 2 pm Self-Care Activities: - Self administers medications as prescribed Attends all scheduled provider appointments Calls provider office for new concerns, questions, or BP outside discussed parameters Checks BP and records as discussed  Follows a low sodium diet/DASH diet Patient Goals: - check blood pressure weekly - choose a place to take my blood pressure (home, clinic or office, retail store) - agree on reward when goals are met - agree to work together to make changes - ask questions to understand - have a family meeting to talk about healthy habits - learn about high blood pressure - blood pressure equipment and technique reviewed - blood pressure trends  reviewed - depression screen reviewed - home or ambulatory blood pressure monitoring encouraged  Follow Up Plan: Telephone follow up appointment with care management team member scheduled for: 01-16-2021 at 0900 am    Task: RNCM: Identify and Monitor Blood Pressure Elevation   Note:   Care Management Activities:    - blood pressure equipment and technique reviewed - blood pressure trends reviewed - depression screen reviewed - home or ambulatory blood pressure monitoring encouraged         Plan: Telephone follow up appointment with care management team member scheduled for:  01-16-2021 at 0900 am  [place], MSN, [facility] Mobile: [phone]

## 2020-12-26 ENCOUNTER — Telehealth: Payer: Self-pay

## 2020-12-26 DIAGNOSIS — Z79899 Other long term (current) drug therapy: Secondary | ICD-10-CM

## 2020-12-26 NOTE — Progress Notes (Signed)
[facility])  [place] Team    12/26/2020  [name] 21-Jan-1971 [id number]  Brief Note:   [facility] received a referral today for medication management issues and questions from the patient. Per consult, patient would like outreach as soon as possible. I called and spoke with the patient today to assess if there were immediate needs or concerns and was told by the patient that there wasn't. Overall, she was concerned about being on so many medication by different prescriber's and was worried about the possible side effects.  I informed the patient that the call will be brief but scheduled to talk to her on 12/30/2020 at 10:00 AM.   Thank you for allowing pharmacy to be a part of this patient's care.   Kristeen Miss, PharmD Clinical Pharmacist [place] Cell: [phone]

## 2020-12-30 ENCOUNTER — Telehealth: Payer: Self-pay

## 2020-12-30 ENCOUNTER — Telehealth (INDEPENDENT_AMBULATORY_CARE_PROVIDER_SITE_OTHER): Payer: 59 | Admitting: Psychology

## 2020-12-30 DIAGNOSIS — Z79899 Other long term (current) drug therapy: Secondary | ICD-10-CM

## 2020-12-30 DIAGNOSIS — F3189 Other bipolar disorder: Secondary | ICD-10-CM

## 2020-12-30 NOTE — Progress Notes (Signed)
[facility])  [place] Team    12/30/2020  Lori Knight [birth year] [id number]  Reason for referral: Medication Management  Referral source:  [place] Management RN - [name] Current insurance:  [place]  PMHx includes but not limited to:  Lori Knight has a PMH significant for type 2 diabetes mellitus, hypertension, morbid obesity, and bipolar l disorder.   Outreach:  Successful telephone call with Lori Knight.  HIPAA identifiers verified.   Subjective:  [name] received a referral for medication management and to answer questions related to side effects. Patient expressed need for referral due to concerns for possible drug interactions, inquiry of reducing some medications, and reviewing her medications. Overall, patient seems compliant with her medications and is able to afford her medications.   I reviewed a list of her current medications:   Hypertension  Taking Amlodipine - 5 mg QHS  She noticed that since she has switched to nighttime dosing, her headaches have improved and seems to "feel better and not as crappy" Taking Losartan 100 mg  tablet BID - 100 mg dose per previous insurance request but now she has a different insurance. She would like to talk to her PCP at the next OV to switch to 50 mg tablets BID instead of splitting 100 mg in order to have a more consistent dose. She does not regularly check her blood pressure at home, even when she has headaches. Discussed periodically checking her blood pressure especially when she has a headache Discussed BP goal of <130/80 mmHg given age and h/o of aortic atherosclerosis. Per recent office visits recorded vitals, her blood pressure is somewhat close to goal.  Hyperlipidemia Taking Atorvastatin 20 mg daily - She does have muscle aches in her back but feels this is d/t a fall that she had late last year. However, when questioned further, she stated that she does have  muscle aches in her upper arms since starting this medication. Denies joint pain. She has resumed taking atorvastatin as metallic taste worsened when atorvastatin was stopped.  Most recent LDL 65 mg/dL (10/28/2020 labs) is at goal as well and TG and TC.  Fish Oil 1200 mg QHS - takes off [employer] because it decreases the fish oil taste Diabetes  A1c at goal, <7%. Metformin 1000 mg twice daily - discussed drug interaction with topiramate that could could cause lactic acidosis. Discussed when to contact provider/seek immediate medical attention. She discussed concerns of ammonia levels as it relates to memory issues and patient was provided education on this matter.  Glipizide - stopped taking several months ago and [name] is aware. Trulicity 1.5 mg once weekly Pay $25 per month for this medication - denies issues affording medication. She reported FBG 160-200 mg/dL. Encouraged pt to continue checking BG as recommended.  I asked the patient about low BG events or specific symptoms in the past 30 days and she reported " I cannot differentiate low BG symptoms since I am bipolar". Behavioral related medications Lamictal -She takes 300 mg once daily  Topiramate 25 mg daily - pt feels this is helping Hypothyroidism Levothyroxine 175 mcg daily  Discussed taking 4 hours prior to omeprazole to improve absorption Educated to take on an empty stomach GERD Omeprazole 40 mg daily- she still has metallic taste. Will see GI on 6/27 Educated to take 30 min to 1 hr prior to meal or with food.  Misc.  Ondansetron - takes PRN  Vitamin C -  taking 1000mg   daily Taking Cholecalciferol 125 mcg QD    Objective: The ASCVD Risk score Mikey Bussing DC Jr., et al., 2013) failed to calculate for the following reasons:   The patient has a prior MI or stroke diagnosis  Lab Results  Component Value Date   CREATININE 0.74 09/11/2020   CREATININE 0.91 02/20/2020   CREATININE 0.8 05/31/2019    Lab Results   Component Value Date   HGBA1C 6.7% 03/19/2020    Lipid Panel     Component Value Date/Time   CHOL 237 (H) 09/11/2020 0925   TRIG 255 (H) 09/11/2020 0925   HDL 43 09/11/2020 0925   LDLCALC 147 (H) 09/11/2020 0925    BP Readings from Last 3 Encounters:  12/16/20 131/86  11/27/20 133/84  11/05/20 120/82    Allergies  Allergen Reactions   Hctz [Hydrochlorothiazide] Cough   Lisinopril Cough   Latex Rash    Condoms only   Prednisone Anxiety    Paranoia    Medications Reviewed Today     Reviewed by Vanita Ingles on 12/25/20 at 1559  Med List Status: <None>   Medication Order Taking? Sig Documenting Provider Last Dose Status Informant  acetaminophen (TYLENOL) 500 MG tablet [id number] Yes Take 500 mg by mouth every 6 (six) hours as needed. [provider] Taking Active   albuterol (VENTOLIN HFA) 108 (90 Base) MCG/ACT inhaler [id number] Yes Inhale 2 puffs into the lungs every 4 (four) hours as needed for wheezing or shortness of breath. [provider] Taking Active   amLODipine (NORVASC) 5 MG tablet [id number] Yes TAKE 1 TABLET BY MOUTH EVERY DAY Cannady, Jolene T, NP Taking Active   Ascorbic Acid (VITAMIN C) 1000 MG tablet [id number] Yes Take 1,000 mg by mouth daily. [provider] Taking Active Self  atorvastatin (LIPITOR) 20 MG tablet [id number] Yes Take 20 mg by mouth daily. [provider] Taking Active   Cholecalciferol 5000 units capsule [id number] Yes Take 1 capsule (5,000 Units total) by mouth daily. Adline Potter, MD Taking Active Self  CONTOUR NEXT TEST test strip [id number] Yes USE TO CHECK BS UP TO 3 TIMES DAILY FOR DIABETES DX: E11.9 [name], MD Taking Active   Dulaglutide 1.5 MG/0.5ML SOPN [id number] Yes Inject 1.5 mg into the skin once a week. [provider] Taking Active            Med Note Oneita Jolly Aug 11, 2020  1:24 PM)    lamoTRIgine (LAMICTAL) 150 MG tablet [id number] Yes Take 250 mg by mouth  daily. [provider] Taking Active Self  levothyroxine (SYNTHROID, LEVOTHROID) 175 MCG tablet [id number] Yes TAKE 1 TABLET (175 MCG TOTAL) BY MOUTH DAILY. [name], MD Taking Active Self           Med Note Hall Busing, Mercie Eon Dec 25, 2020  3:55 PM) Takes 2 on Sunday  losartan (COZAAR) 100 MG tablet [id number] Yes TAKE 1/2 TABLET (50 MG TOTAL) BY MOUTH 2 (TWO) TIMES DAILY. [name], NP Taking Active   metFORMIN (GLUCOPHAGE) 500 MG tablet [id number] Yes Take 1 tablet (500 mg total) by mouth 3 (three) times daily.  Patient taking differently: Take 1,000 mg by mouth 2 (two) times daily.   [name], MD Taking Active Self  Omega-3 Fatty Acids (FISH OIL) 1200 MG CAPS [id number] Yes Take 1,200 mg by mouth daily.  [provider] Taking Active Self  omeprazole (PRILOSEC) 40 MG capsule [id number] Yes Take 1  capsule (40 mg total) by mouth daily. [name], NP Taking Active   ondansetron (ZOFRAN ODT) 4 MG disintegrating tablet [id number] Yes Take 1 tablet (4 mg total) by mouth every 8 (eight) hours as needed for nausea or vomiting. Chase Picket, MD Taking Active   topiramate (TOPAMAX) 25 MG tablet [id number] Yes Take 1 tablet (25 mg total) by mouth 2 (two) times daily. Georgia Lopes, DO Taking Active             Assessment/Plan: Discussed with the patient that at this time, there are no medications to de-prescribe. However, with continual improvement with weight loss, diet, and exercise it would help control her blood pressure and A1c.  If clinically appropriate and when the time comes, amlodipine could be removed since it does not have kidney protecting benefits. I emphasized that the patient would likely need to regularly check her blood pressure at home and to consistently have her blood pressure <130/80 mmHg (or below the established blood pressure goal set by provider). I also mentioned that down the road, her diabetes regimen could be adjusted  based of the above factors.  Patient may have statin intolerance that could be discussed. May consider alternative dosing regimen for atorvastatin or switch to rosuvastatin which could be better tolerated. Since she is already taking amlodipine at night, may consider consolidating losartan to 100 mg QAM to reduce frequency of dosing.   Thank you for allowing pharmacy to be a part of this patient's care.  Kristeen Miss, PharmD Clinical Pharmacist [place] Cell: [phone]

## 2021-01-01 ENCOUNTER — Encounter (INDEPENDENT_AMBULATORY_CARE_PROVIDER_SITE_OTHER): Payer: Self-pay | Admitting: Bariatrics

## 2021-01-01 ENCOUNTER — Ambulatory Visit (INDEPENDENT_AMBULATORY_CARE_PROVIDER_SITE_OTHER): Payer: 59 | Admitting: Bariatrics

## 2021-01-01 ENCOUNTER — Other Ambulatory Visit: Payer: Self-pay

## 2021-01-01 VITALS — BP 140/82 | HR 84 | Temp 98.5°F | Ht 67.0 in | Wt 280.0 lb

## 2021-01-01 DIAGNOSIS — E669 Obesity, unspecified: Secondary | ICD-10-CM

## 2021-01-01 DIAGNOSIS — Z6841 Body Mass Index (BMI) 40.0 and over, adult: Secondary | ICD-10-CM

## 2021-01-01 DIAGNOSIS — E1169 Type 2 diabetes mellitus with other specified complication: Secondary | ICD-10-CM | POA: Diagnosis not present

## 2021-01-01 DIAGNOSIS — E038 Other specified hypothyroidism: Secondary | ICD-10-CM

## 2021-01-01 DIAGNOSIS — F5089 Other specified eating disorder: Secondary | ICD-10-CM

## 2021-01-01 DIAGNOSIS — Z9189 Other specified personal risk factors, not elsewhere classified: Secondary | ICD-10-CM | POA: Diagnosis not present

## 2021-01-01 MED ORDER — TOPIRAMATE 25 MG PO TABS: 25.0000 mg | ORAL_TABLET | Freq: Two times a day (BID) | ORAL | 0 refills | Status: DC

## 2021-01-05 ENCOUNTER — Ambulatory Visit (INDEPENDENT_AMBULATORY_CARE_PROVIDER_SITE_OTHER): Payer: 59 | Admitting: Gastroenterology

## 2021-01-05 ENCOUNTER — Other Ambulatory Visit: Payer: Self-pay

## 2021-01-05 ENCOUNTER — Encounter: Payer: Self-pay | Admitting: Gastroenterology

## 2021-01-05 VITALS — BP 139/92 | HR 70 | Temp 98.0°F | Ht 67.0 in | Wt 281.6 lb

## 2021-01-05 DIAGNOSIS — R432 Parageusia: Secondary | ICD-10-CM | POA: Diagnosis not present

## 2021-01-05 DIAGNOSIS — K219 Gastro-esophageal reflux disease without esophagitis: Secondary | ICD-10-CM

## 2021-01-05 NOTE — H&P (View-Only) (Signed)
Gastroenterology Consultation  Referring Provider:     [name], NP Primary Care Physician:  [name], NP Primary Gastroenterologist:  Dr. [name]     Reason for Consultation:     GERD        HPI:   Lori Knight is a 50 y.o. y/o female referred for consultation & management of GERD by Dr. [name], NP.  This patient comes to me after being seen by her primary care provider for dysgeusia.  The patient has been on Prilosec 40 mg a day.  She also reports that the dysgeusia is described as a metal taste in her mouth that has been going on since last March.  She denies any associated heartburn.  The patient was last seen by me in December 2016 for alternating diarrhea and constipation.  She was also having rectal bleeding and rectal pain.  The patient underwent a colonoscopy that only showed internal hemorrhoids and an anal fissure. The patient now reports that her heartburn is well controlled that she does not have any burning in the chest but she does report that she has this metallic taste.  She also states that she has a friend who had similar symptoms and went to a gastrologist and was told to modify her diet which was described basically is an antireflux diet.  There is no report of any unexplained weight loss fevers chills nausea or vomiting.  Past Medical History:  Diagnosis Date   Adult hypothyroidism 06/21/2012   Anxiety    Back pain    Benign paroxysmal positional nystagmus    Bilateral polycystic ovarian syndrome 06/21/2012   Biliary calculus with cholecystitis 08/14/2012   Bipolar 1 disorder (HCC)    BP (high blood pressure) 02/14/2014   Bronchitis    Constipation    COVID-19 07/2020   Depression    Diabetes mellitus ([place]) 08/14/2012   Diabetes mellitus without complication ([place])    pre-diabetic   Dysrhythmia    wore heart monitor 2016. Corrected by changing Levothyroxine dose.   Edema of both lower extremities    Gallbladder problem    GERD  (gastroesophageal reflux disease)    Heart murmur    followed by PCP-AS A CHILD-ASYMPTOMATIC   Hyperlipidemia    Hyperprolactinemia ([place]) 06/21/2012   Hypertension    Hypothyroid    Hypoxia 04/17/2018   Joint pain    Kidney problem    Meningioma (HCC)    Motion sickness    carnival rides   Neuropathy    PONV (postoperative nausea and vomiting)    WAKES UP CRYING   Shortness of breath dyspnea    stairs. related to wt.   Sleep apnea    has CPAP. has not used since 2011   Swallowing difficulty    Vertigo    2x in last yr   Vitamin D deficiency     Past Surgical History:  Procedure Laterality Date   ABDOMINAL HYSTERECTOMY  2012   cervical dysplasia/ovaries remian   CHOLECYSTECTOMY     COLONOSCOPY WITH PROPOFOL N/A 06/19/2015   Procedure: COLONOSCOPY WITH PROPOFOL;  Surgeon: [name], MD;  Location: [place];  Service: Endoscopy;  Laterality: N/A;  Diabetic - oral meds    DENTAL SURGERY     ETHMOIDECTOMY Bilateral 04/17/2018   Procedure: ETHMOIDECTOMY;  Surgeon: [name], MD;  Location: [facility];  Service: ENT;  Laterality: Bilateral;   GALLBLADDER SURGERY     IMAGE GUIDED SINUS SURGERY N/A 04/17/2018  Procedure: IMAGE GUIDED SINUS SURGERY;  Surgeon: [name], MD;  Location: [facility];  Service: ENT;  Laterality: N/A;   MAXILLARY ANTROSTOMY Bilateral 04/17/2018   Procedure: MAXILLARY ANTROSTOMY;  Surgeon: [name], MD;  Location: [facility];  Service: ENT;  Laterality: Bilateral;   NASAL SEPTOPLASTY W/ TURBINOPLASTY Bilateral 04/17/2018   Procedure: NASAL SEPTOPLASTY WITH TURBINATE REDUCTION;  Surgeon: [name], MD;  Location: [facility];  Service: ENT;  Laterality: Bilateral;   TRANSURETHRAL RESECTION OF BLADDER TUMOR N/A 04/17/2018   Procedure: NTRANSURETHRAL RESECTION OF BLADDER TUMOR (TURBT) WITH GEMCITABINE;  Surgeon: [name], MD;  Location: [facility];  Service: Urology;  Laterality: N/A;    Prior to Admission medications   Medication Sig  Start Date End Date Taking? Authorizing Provider  acetaminophen (TYLENOL) 500 MG tablet Take 500 mg by mouth every 6 (six) hours as needed.    [provider]  albuterol (VENTOLIN HFA) 108 (90 Base) MCG/ACT inhaler Inhale 2 puffs into the lungs every 4 (four) hours as needed for wheezing or shortness of breath. 08/11/20   [provider]  amLODipine (NORVASC) 5 MG tablet TAKE 1 TABLET BY MOUTH EVERY DAY 12/09/20   Cannady, Henrine Screws T, NP  Ascorbic Acid (VITAMIN C) 1000 MG tablet Take 1,000 mg by mouth daily.    [provider]  atorvastatin (LIPITOR) 20 MG tablet Take 20 mg by mouth daily.    [provider]  Cholecalciferol 5000 units capsule Take 1 capsule (5,000 Units total) by mouth daily. 11/28/15   Adline Potter, MD  CONTOUR NEXT TEST test strip USE TO CHECK BS UP TO 3 TIMES DAILY FOR DIABETES DX: E11.9 06/13/19   [name], MD  Dulaglutide 1.5 MG/0.5ML SOPN Inject 1.5 mg into the skin once a week. 08/28/18   [provider]  lamoTRIgine (LAMICTAL) 150 MG tablet Take 250 mg by mouth daily. 07/03/20   [provider]  levothyroxine (SYNTHROID, LEVOTHROID) 175 MCG tablet TAKE 1 TABLET (175 MCG TOTAL) BY MOUTH DAILY. 09/05/17   [name], MD  losartan (COZAAR) 100 MG tablet TAKE 1/2 TABLET (50 MG TOTAL) BY MOUTH 2 (TWO) TIMES DAILY. 12/16/20   Cannady, Henrine Screws T, NP  metFORMIN (GLUCOPHAGE) 500 MG tablet Take 1 tablet (500 mg total) by mouth 3 (three) times daily. Patient taking differently: Take 1,000 mg by mouth 2 (two) times daily. 07/10/18   [name], MD  Omega-3 Fatty Acids (FISH OIL) 1200 MG CAPS Take 1,200 mg by mouth daily.     [provider]  omeprazole (PRILOSEC) 40 MG capsule Take 1 capsule (40 mg total) by mouth daily. 04/23/20   Cannady, Henrine Screws T, NP  ondansetron (ZOFRAN ODT) 4 MG disintegrating tablet Take 1 tablet (4 mg total) by mouth every 8 (eight) hours as needed for nausea or vomiting. 07/09/19    Lamptey, Myrene Galas, MD  topiramate (TOPAMAX) 25 MG tablet Take 1 tablet (25 mg total) by mouth 2 (two) times daily. 01/01/21   Georgia Lopes, DO    Family History  Problem Relation Age of Onset   Diabetes Maternal Grandmother    Hypertension Maternal Grandmother    Hyperlipidemia Maternal Grandmother    Hypothyroidism Maternal Grandmother    Heart disease Maternal Grandmother    Diabetes Mother    Hypertension Mother    Hypothyroidism Mother    Bipolar disorder Mother    Heart disease Mother    Kidney disease Mother    Thyroid disease Mother  Depression Mother    Anxiety disorder Mother    Drug abuse Mother    Obesity Mother    Alcohol abuse Father    Depression Daughter    Breast cancer Neg Hx      Social History   Tobacco Use   Smoking status: Former    Packs/day: 1.00    Years: 10.00    Pack years: 10.00    Types: Cigarettes    Quit date: 07/12/2002    Years since quitting: 18.4   Smokeless tobacco: Never   Tobacco comments:    quit 2004  Vaping Use   Vaping Use: Never used  Substance Use Topics   Alcohol use: Yes    Alcohol/week: 0.0 standard drinks    Comment: rarely   Drug use: No    Allergies as of 01/05/2021 - Review Complete 01/01/2021  Allergen Reaction Noted   Hctz [hydrochlorothiazide] Cough 12/26/2017   Lisinopril Cough 03/01/2015   Latex Rash 06/17/2015   Prednisone Anxiety 05/05/2018    Review of Systems:    All systems reviewed and negative except where noted in HPI.   Physical Exam:  There were no vitals taken for this visit. No LMP recorded. Patient has had a hysterectomy. General:   Alert,  Well-developed, well-nourished, pleasant and cooperative in NAD Head:  Normocephalic and atraumatic. Eyes:  Sclera clear, no icterus.   Conjunctiva pink. Ears:  Normal auditory acuity. Neck:  Supple; no masses or thyromegaly. Lungs:  Respirations even and unlabored.  Clear throughout to auscultation.   No wheezes, crackles, or rhonchi. No acute  distress. Heart:  Regular rate and rhythm; no murmurs, clicks, rubs, or gallops. Abdomen:  Normal bowel sounds.  No bruits.  Soft, non-tender and non-distended without masses, hepatosplenomegaly or hernias noted.  No guarding or rebound tenderness.  Negative Carnett sign.   Rectal:  Deferred.  Pulses:  Normal pulses noted. Extremities:  No clubbing or edema.  No cyanosis. Neurologic:  Alert and oriented x3;  grossly normal neurologically. Skin:  Intact without significant lesions or rashes.  No jaundice. Lymph Nodes:  No significant cervical adenopathy. Psych:  Alert and cooperative. Normal mood and affect.  Imaging Studies: No results found.  Assessment and Plan:   Lori Knight is a 50 y.o. y/o female who comes in today with dysgeusia and a history of GERD. The patient has been told that although she has seen a dentist and had her teeth cleaned that a metallic taste in the mouth is not common in as a manifestation of GERD.  She has been told that due to her symptoms and her long-standing heartburn she will be set up for an upper endoscopy.  The patient may need esophageal impedance monitoring to see if she is actually having reflux as a cause of her symptoms.  The patient has also been told that if it is caused by reflux that antireflux surgery would be an option.  The patient has been explained the plan and agrees with it.    [name], MD. Marval Regal    Note: This dictation was prepared with Dragon dictation along with smaller phrase technology. Any transcriptional errors that result from this process are unintentional.

## 2021-01-05 NOTE — Progress Notes (Signed)
Gastroenterology Consultation  Referring Provider:     [name], NP Primary Care Physician:  [name], NP Primary Gastroenterologist:  Dr. [name]     Reason for Consultation:     GERD        HPI:   Lori Knight is a 50 y.o. y/o female referred for consultation & management of GERD by Dr. [name], NP.  This patient comes to me after being seen by her primary care provider for dysgeusia.  The patient has been on Prilosec 40 mg a day.  She also reports that the dysgeusia is described as a metal taste in her mouth that has been going on since last March.  She denies any associated heartburn.  The patient was last seen by me in December 2016 for alternating diarrhea and constipation.  She was also having rectal bleeding and rectal pain.  The patient underwent a colonoscopy that only showed internal hemorrhoids and an anal fissure. The patient now reports that her heartburn is well controlled that she does not have any burning in the chest but she does report that she has this metallic taste.  She also states that she has a friend who had similar symptoms and went to a gastrologist and was told to modify her diet which was described basically is an antireflux diet.  There is no report of any unexplained weight loss fevers chills nausea or vomiting.  Past Medical History:  Diagnosis Date   Adult hypothyroidism 06/21/2012   Anxiety    Back pain    Benign paroxysmal positional nystagmus    Bilateral polycystic ovarian syndrome 06/21/2012   Biliary calculus with cholecystitis 08/14/2012   Bipolar 1 disorder (HCC)    BP (high blood pressure) 02/14/2014   Bronchitis    Constipation    COVID-19 07/2020   Depression    Diabetes mellitus ([place]) 08/14/2012   Diabetes mellitus without complication ([place])    pre-diabetic   Dysrhythmia    wore heart monitor 2016. Corrected by changing Levothyroxine dose.   Edema of both lower extremities    Gallbladder problem    GERD  (gastroesophageal reflux disease)    Heart murmur    followed by PCP-AS A CHILD-ASYMPTOMATIC   Hyperlipidemia    Hyperprolactinemia ([place]) 06/21/2012   Hypertension    Hypothyroid    Hypoxia 04/17/2018   Joint pain    Kidney problem    Meningioma (HCC)    Motion sickness    carnival rides   Neuropathy    PONV (postoperative nausea and vomiting)    WAKES UP CRYING   Shortness of breath dyspnea    stairs. related to wt.   Sleep apnea    has CPAP. has not used since 2011   Swallowing difficulty    Vertigo    2x in last yr   Vitamin D deficiency     Past Surgical History:  Procedure Laterality Date   ABDOMINAL HYSTERECTOMY  2012   cervical dysplasia/ovaries remian   CHOLECYSTECTOMY     COLONOSCOPY WITH PROPOFOL N/A 06/19/2015   Procedure: COLONOSCOPY WITH PROPOFOL;  Surgeon: [name], MD;  Location: [facility];  Service: Endoscopy;  Laterality: N/A;  Diabetic - oral meds    DENTAL SURGERY     ETHMOIDECTOMY Bilateral 04/17/2018   Procedure: ETHMOIDECTOMY;  Surgeon: [name], MD;  Location: [facility];  Service: ENT;  Laterality: Bilateral;   GALLBLADDER SURGERY     IMAGE GUIDED SINUS SURGERY N/A 04/17/2018  Procedure: IMAGE GUIDED SINUS SURGERY;  Surgeon: [name], MD;  Location: [facility];  Service: ENT;  Laterality: N/A;   MAXILLARY ANTROSTOMY Bilateral 04/17/2018   Procedure: MAXILLARY ANTROSTOMY;  Surgeon: [name], MD;  Location: [facility];  Service: ENT;  Laterality: Bilateral;   NASAL SEPTOPLASTY W/ TURBINOPLASTY Bilateral 04/17/2018   Procedure: NASAL SEPTOPLASTY WITH TURBINATE REDUCTION;  Surgeon: [name], MD;  Location: [facility];  Service: ENT;  Laterality: Bilateral;   TRANSURETHRAL RESECTION OF BLADDER TUMOR N/A 04/17/2018   Procedure: NTRANSURETHRAL RESECTION OF BLADDER TUMOR (TURBT) WITH GEMCITABINE;  Surgeon: [name], MD;  Location: [facility];  Service: Urology;  Laterality: N/A;    Prior to Admission medications   Medication Sig  Start Date End Date Taking? Authorizing Provider  acetaminophen (TYLENOL) 500 MG tablet Take 500 mg by mouth every 6 (six) hours as needed.    [provider]  albuterol (VENTOLIN HFA) 108 (90 Base) MCG/ACT inhaler Inhale 2 puffs into the lungs every 4 (four) hours as needed for wheezing or shortness of breath. 08/11/20   [provider]  amLODipine (NORVASC) 5 MG tablet TAKE 1 TABLET BY MOUTH EVERY DAY 12/09/20   Cannady, Henrine Screws T, NP  Ascorbic Acid (VITAMIN C) 1000 MG tablet Take 1,000 mg by mouth daily.    [provider]  atorvastatin (LIPITOR) 20 MG tablet Take 20 mg by mouth daily.    [provider]  Cholecalciferol 5000 units capsule Take 1 capsule (5,000 Units total) by mouth daily. 11/28/15   Adline Potter, MD  CONTOUR NEXT TEST test strip USE TO CHECK BS UP TO 3 TIMES DAILY FOR DIABETES DX: E11.9 06/13/19   [name], MD  Dulaglutide 1.5 MG/0.5ML SOPN Inject 1.5 mg into the skin once a week. 08/28/18   [provider]  lamoTRIgine (LAMICTAL) 150 MG tablet Take 250 mg by mouth daily. 07/03/20   [provider]  levothyroxine (SYNTHROID, LEVOTHROID) 175 MCG tablet TAKE 1 TABLET (175 MCG TOTAL) BY MOUTH DAILY. 09/05/17   [name], MD  losartan (COZAAR) 100 MG tablet TAKE 1/2 TABLET (50 MG TOTAL) BY MOUTH 2 (TWO) TIMES DAILY. 12/16/20   Cannady, Henrine Screws T, NP  metFORMIN (GLUCOPHAGE) 500 MG tablet Take 1 tablet (500 mg total) by mouth 3 (three) times daily. Patient taking differently: Take 1,000 mg by mouth 2 (two) times daily. 07/10/18   [name], MD  Omega-3 Fatty Acids (FISH OIL) 1200 MG CAPS Take 1,200 mg by mouth daily.     [provider]  omeprazole (PRILOSEC) 40 MG capsule Take 1 capsule (40 mg total) by mouth daily. 04/23/20   Cannady, Henrine Screws T, NP  ondansetron (ZOFRAN ODT) 4 MG disintegrating tablet Take 1 tablet (4 mg total) by mouth every 8 (eight) hours as needed for nausea or vomiting. 07/09/19    Lamptey, Myrene Galas, MD  topiramate (TOPAMAX) 25 MG tablet Take 1 tablet (25 mg total) by mouth 2 (two) times daily. 01/01/21   Georgia Lopes, DO    Family History  Problem Relation Age of Onset   Diabetes Maternal Grandmother    Hypertension Maternal Grandmother    Hyperlipidemia Maternal Grandmother    Hypothyroidism Maternal Grandmother    Heart disease Maternal Grandmother    Diabetes Mother    Hypertension Mother    Hypothyroidism Mother    Bipolar disorder Mother    Heart disease Mother    Kidney disease Mother    Thyroid disease Mother  Depression Mother    Anxiety disorder Mother    Drug abuse Mother    Obesity Mother    Alcohol abuse Father    Depression Daughter    Breast cancer Neg Hx      Social History   Tobacco Use   Smoking status: Former    Packs/day: 1.00    Years: 10.00    Pack years: 10.00    Types: Cigarettes    Quit date: 07/12/2002    Years since quitting: 18.4   Smokeless tobacco: Never   Tobacco comments:    quit 2004  Vaping Use   Vaping Use: Never used  Substance Use Topics   Alcohol use: Yes    Alcohol/week: 0.0 standard drinks    Comment: rarely   Drug use: No    Allergies as of 01/05/2021 - Review Complete 01/01/2021  Allergen Reaction Noted   Hctz [hydrochlorothiazide] Cough 12/26/2017   Lisinopril Cough 03/01/2015   Latex Rash 06/17/2015   Prednisone Anxiety 05/05/2018    Review of Systems:    All systems reviewed and negative except where noted in HPI.   Physical Exam:  There were no vitals taken for this visit. No LMP recorded. Patient has had a hysterectomy. General:   Alert,  Well-developed, well-nourished, pleasant and cooperative in NAD Head:  Normocephalic and atraumatic. Eyes:  Sclera clear, no icterus.   Conjunctiva pink. Ears:  Normal auditory acuity. Neck:  Supple; no masses or thyromegaly. Lungs:  Respirations even and unlabored.  Clear throughout to auscultation.   No wheezes, crackles, or rhonchi. No acute  distress. Heart:  Regular rate and rhythm; no murmurs, clicks, rubs, or gallops. Abdomen:  Normal bowel sounds.  No bruits.  Soft, non-tender and non-distended without masses, hepatosplenomegaly or hernias noted.  No guarding or rebound tenderness.  Negative Carnett sign.   Rectal:  Deferred.  Pulses:  Normal pulses noted. Extremities:  No clubbing or edema.  No cyanosis. Neurologic:  Alert and oriented x3;  grossly normal neurologically. Skin:  Intact without significant lesions or rashes.  No jaundice. Lymph Nodes:  No significant cervical adenopathy. Psych:  Alert and cooperative. Normal mood and affect.  Imaging Studies: No results found.  Assessment and Plan:   Tawn Fitzner is a 50 y.o. y/o female who comes in today with dysgeusia and a history of GERD. The patient has been told that although she has seen a dentist and had her teeth cleaned that a metallic taste in the mouth is not common in as a manifestation of GERD.  She has been told that due to her symptoms and her long-standing heartburn she will be set up for an upper endoscopy.  The patient may need esophageal impedance monitoring to see if she is actually having reflux as a cause of her symptoms.  The patient has also been told that if it is caused by reflux that antireflux surgery would be an option.  The patient has been explained the plan and agrees with it.    [name], MD. Marval Regal    Note: This dictation was prepared with Dragon dictation along with smaller phrase technology. Any transcriptional errors that result from this process are unintentional.

## 2021-01-06 ENCOUNTER — Encounter: Payer: Self-pay | Admitting: Gastroenterology

## 2021-01-07 ENCOUNTER — Ambulatory Visit (INDEPENDENT_AMBULATORY_CARE_PROVIDER_SITE_OTHER): Payer: 59 | Admitting: Psychology

## 2021-01-07 DIAGNOSIS — F3189 Other bipolar disorder: Secondary | ICD-10-CM

## 2021-01-07 LAB — HM DIABETES EYE EXAM

## 2021-01-07 NOTE — Progress Notes (Signed)
Chief Complaint:   OBESITY Lori Knight is here to discuss her progress with her obesity treatment plan along with follow-up of her obesity related diagnoses. Lori Knight is on the Category 3 Plan and states she is following her eating plan approximately 50% of the time. Lori Knight states she is doing physical therapy and pilates for 8-20 minutes 2 times per week.  Today's visit was #: 6 Starting weight: 289 lbs Starting date: 09/11/2020 Today's weight: 280 lbs Today's date: 01/01/2021 Total lbs lost to date: 9 Total lbs lost since last in-office visit: 1  Interim History: Lori Knight is down 1 lb. She is doing ok with her water.  Subjective:   1. Type 2 diabetes mellitus with obesity ([place]) Lori Knight taking Dulaglutide and metformin.   2. Other specified hypothyroidism Lori Knight is taking Synthroid.  3. Other disorder of eating Lori Knight is taking Topamax.  4. At risk for dehydration Lori Knight is at risk for dehydration due to weather and exercise.  Assessment/Plan:   1. Type 2 diabetes mellitus with obesity ([place]) Lori Knight will continue her medications. [name], neuropathy, limb loss, blindness, coronary artery disease, and death. Intensive lifestyle modification including diet, exercise and weight loss are the first line of treatment for diabetes.   2. Other specified hypothyroidism Lori Knight will continue Synthroid. Orders and follow up as documented in patient record.  Counseling Good thyroid control is important for overall health. Supratherapeutic thyroid levels are dangerous and will not improve weight loss results. Counseling: The correct way to take levothyroxine is fasting, with water, separated by at least 30 minutes from breakfast, and separated by more than 4 hours from calcium, iron, multivitamins, acid reflux medications (PPIs).   3. Other disorder of eating Behavior modification techniques were  discussed today to help Lori Knight deal with her emotional/non-hunger eating behaviors. We will refill Topamax for 1 month. Orders and follow up as documented in patient record.   - topiramate (TOPAMAX) 25 MG tablet; Take 1 tablet (25 mg total) by mouth 2 (two) times daily.  Dispense: 60 tablet; Refill: 0  4. At risk for dehydration Lori Knight was given approximately 15 minutes dehydration prevention counseling today. Lori Knight is at risk for dehydration due to weight loss and current medication(s). She was encouraged to hydrate and monitor fluid status to avoid dehydration as well as weight loss plateaus.   5. Obesity with current BMI of 43.9 Lori Knight is currently in the action stage of change. As such, her goal is to continue with weight loss efforts. She has agreed to the Category 3 Plan.   Lori Knight will stick more closely to the meal plan.  Exercise goals: As is.  Behavioral modification strategies: increasing lean protein intake, decreasing simple carbohydrates, increasing vegetables, increasing water intake, decreasing eating out, no skipping meals, meal planning and cooking strategies, keeping healthy foods in the home, and planning for success.  Lori Knight has agreed to follow-up with our clinic in 4 weeks. She was informed of the importance of frequent follow-up visits to maximize her success with intensive lifestyle modifications for her multiple health conditions.   Objective:   Blood pressure 140/82, pulse 84, temperature 98.5 F (36.9 C), height 5\' 7"  (1.702 m), weight 280 lb (127 kg), SpO2 (!) 50 %. Body mass index is 43.85 kg/m.  General: Cooperative, alert, well developed, in no acute distress. HEENT: Conjunctivae and lids unremarkable. Cardiovascular: Regular rhythm.  Lungs: Normal work of breathing. Neurologic: No focal  deficits.   Lab Results  Component Value Date   CREATININE 0.74 09/11/2020   BUN 12 09/11/2020   NA 140 09/11/2020   K 4.5 09/11/2020   CL 100 09/11/2020   CO2 24  09/11/2020   Lab Results  Component Value Date   ALT 33 (H) 09/11/2020   AST 20 09/11/2020   ALKPHOS 122 (H) 09/11/2020   BILITOT 0.2 09/11/2020   Lab Results  Component Value Date   HGBA1C 6.7% 03/19/2020   HGBA1C 7.8 05/31/2019   HGBA1C 7.8 05/31/2019   HGBA1C 8.0 07/17/2018   HGBA1C 8.5 (H) 04/17/2018   Lab Results  Component Value Date   INSULIN 28.7 (H) 09/11/2020   Lab Results  Component Value Date   TSH 0.396 (L) 09/11/2020   Lab Results  Component Value Date   CHOL 237 (H) 09/11/2020   HDL 43 09/11/2020   LDLCALC 147 (H) 09/11/2020   TRIG 255 (H) 09/11/2020   Lab Results  Component Value Date   VD25OH 51.0 09/11/2020   VD25OH 45.9 06/20/2019   VD25OH 43.1 04/14/2017   Lab Results  Component Value Date   WBC 7.0 07/13/2020   HGB 13.4 07/13/2020   HCT 41.8 07/13/2020   MCV 85.7 07/13/2020   PLT 251 07/13/2020   Lab Results  Component Value Date   IRON 57 12/20/2018   TIBC 388 12/20/2018   FERRITIN 122 12/20/2018   Attestation Statements:   Reviewed by clinician on day of visit: allergies, medications, problem list, medical history, surgical history, family history, social history, and previous encounter notes.   Wilhemena Durie, am acting as Location manager for [employer], DO.  I have reviewed the above documentation for accuracy and completeness, and I agree with the above. Jearld Lesch, DO

## 2021-01-08 ENCOUNTER — Encounter (INDEPENDENT_AMBULATORY_CARE_PROVIDER_SITE_OTHER): Payer: Self-pay | Admitting: Bariatrics

## 2021-01-09 ENCOUNTER — Other Ambulatory Visit: Payer: Self-pay

## 2021-01-09 ENCOUNTER — Encounter: Payer: Self-pay | Admitting: Nurse Practitioner

## 2021-01-09 ENCOUNTER — Ambulatory Visit (INDEPENDENT_AMBULATORY_CARE_PROVIDER_SITE_OTHER): Payer: 59 | Admitting: Nurse Practitioner

## 2021-01-09 ENCOUNTER — Other Ambulatory Visit (INDEPENDENT_AMBULATORY_CARE_PROVIDER_SITE_OTHER): Payer: Self-pay | Admitting: Bariatrics

## 2021-01-09 VITALS — BP 134/87 | HR 78 | Temp 98.6°F | Wt 283.0 lb

## 2021-01-09 DIAGNOSIS — F5089 Other specified eating disorder: Secondary | ICD-10-CM

## 2021-01-09 DIAGNOSIS — M25512 Pain in left shoulder: Secondary | ICD-10-CM | POA: Insufficient documentation

## 2021-01-09 MED ORDER — MELOXICAM 15 MG PO TABS: 15.0000 mg | ORAL_TABLET | Freq: Every day | ORAL | 0 refills | Status: DC

## 2021-01-09 NOTE — Assessment & Plan Note (Signed)
Acute since March, suspect impingement based on exam findings.  Will avoid steroid injection due to her diabetes, she prefers to avoid this as well.  Will place referral to PT, where she currently attends, to see if can add on shoulder therapy.  Meloxicam 15 MG to take daily as needed sent, discussed with her.  Recommend applying heat to shoulder and using OTC creams like Voltaren gel or Icy/Hot.  Return as scheduled.

## 2021-01-09 NOTE — Patient Instructions (Signed)

## 2021-01-09 NOTE — Progress Notes (Signed)
BP 134/87   Pulse 78   Temp 98.6 F (37 C) (Oral)   Wt 283 lb (128.4 kg)   SpO2 98%   BMI 44.32 kg/m    Subjective:    Patient ID: Lori Knight, female    DOB: 1971/04/27, 50 y.o.   MRN: [id number]  HPI: Lori Knight is a 50 y.o. female  Chief Complaint  Patient presents with   Arm Pain    Pt states she has been having L shoulder and elbow pain since March. States the pain has gradually gotten worse. States she does not have pain when sitting still but has the pain when she moves in any way    SHOULDER PAIN States left shoulder pain to elbow since March and elbow is starting to swell.  Some her ROM is affected.  Has gradually become worse.  She is right dominant.  No pain with sitting, more with moving in certain ways.  At physical therapy has been having her pull on weights with her back, which she thinks has agitated shoulder.  Going to [facility] for her workman's comp.   Duration: months Involved shoulder: left Mechanism of injury: unknown Location: diffuse Onset:gradual Severity: 4/10 at worst Quality:  dull, aching, burning, and throbbing Frequency: intermittent Radiation: yes to elbow Aggravating factors: movement  Alleviating factors: APAP and rest  Status: fluctuating Treatments attempted: rest and APAP  Relief with NSAIDs?:  No NSAIDs Taken Weakness: no Numbness: no Decreased grip strength: no Redness: no Swelling: yes to elbow area Bruising: no Fevers: no   Relevant past medical, surgical, family and social history reviewed and updated as indicated. Interim medical history since our last visit reviewed. Allergies and medications reviewed and updated.  Review of Systems  Constitutional:  Negative for activity change, appetite change, diaphoresis, fatigue and fever.  Respiratory:  Negative for cough, chest tightness and shortness of breath.   Cardiovascular:  Negative for chest pain, palpitations and leg swelling.  Gastrointestinal: Negative.    Musculoskeletal:  Positive for arthralgias.  Neurological: Negative.   Psychiatric/Behavioral: Negative.     Per HPI unless specifically indicated above     Objective:    BP 134/87   Pulse 78   Temp 98.6 F (37 C) (Oral)   Wt 283 lb (128.4 kg)   SpO2 98%   BMI 44.32 kg/m   Wt Readings from Last 3 Encounters:  01/09/21 283 lb (128.4 kg)  01/05/21 281 lb 9.6 oz (127.7 kg)  01/01/21 280 lb (127 kg)    Physical Exam Vitals and nursing note reviewed.  Constitutional:      General: She is awake. She is not in acute distress.    Appearance: She is well-developed and well-groomed. She is morbidly obese. She is not ill-appearing.  HENT:     Head: Normocephalic.     Right Ear: Hearing normal.     Left Ear: Hearing normal.  Eyes:     General: Lids are normal.        Right eye: No discharge.        Left eye: No discharge.     Conjunctiva/sclera: Conjunctivae normal.     Pupils: Pupils are equal, round, and reactive to light.  Neck:     Thyroid: No thyromegaly.     Vascular: No carotid bruit.  Cardiovascular:     Rate and Rhythm: Normal rate and regular rhythm.     Heart sounds: Normal heart sounds. No murmur heard.   No gallop.  Pulmonary:  Effort: Pulmonary effort is normal. No accessory muscle usage or respiratory distress.     Breath sounds: Normal breath sounds.  Abdominal:     General: Bowel sounds are normal.     Palpations: Abdomen is soft.  Musculoskeletal:     Right shoulder: Tenderness present. No swelling, laceration or crepitus. Normal range of motion. Decreased strength.     Left shoulder: Normal.     Cervical back: Normal range of motion and neck supple.     Right lower leg: No edema.     Left lower leg: No edema.     Comments: No decrease ROM but discomfort with flexion and extension + abduction and adduction.  Positive Neer and Hawkins.  Tenderness to upper aspect posterior left shoulder.  Lymphadenopathy:     Cervical: No cervical adenopathy.   Skin:    General: Skin is warm and dry.  Neurological:     Mental Status: She is alert and oriented to person, place, and time.  Psychiatric:        Attention and Perception: Attention normal.        Mood and Affect: Mood normal.        Speech: Speech normal.        Behavior: Behavior normal. Behavior is cooperative.        Thought Content: Thought content normal.   Results for orders placed or performed in visit on 01/08/21  HM DIABETES EYE EXAM  Result Value Ref Range   HM Diabetic Eye Exam No Retinopathy No Retinopathy      Assessment & Plan:   Problem List Items Addressed This Visit       Other   Acute pain of left shoulder - Primary    Acute since March, suspect impingement based on exam findings.  Will avoid steroid injection due to her diabetes, she prefers to avoid this as well.  Will place referral to PT, where she currently attends, to see if can add on shoulder therapy.  Meloxicam 15 MG to take daily as needed sent, discussed with her.  Recommend applying heat to shoulder and using OTC creams like Voltaren gel or Icy/Hot.  Return as scheduled.       Relevant Orders   Ambulatory referral to Physical Therapy     Follow up plan: Return if symptoms worsen or fail to improve.

## 2021-01-13 ENCOUNTER — Other Ambulatory Visit (INDEPENDENT_AMBULATORY_CARE_PROVIDER_SITE_OTHER): Payer: Self-pay | Admitting: Bariatrics

## 2021-01-13 DIAGNOSIS — F5089 Other specified eating disorder: Secondary | ICD-10-CM

## 2021-01-13 MED ORDER — TOPIRAMATE 25 MG PO TABS
25.0000 mg | ORAL_TABLET | Freq: Two times a day (BID) | ORAL | 0 refills | Status: DC
Start: 2021-01-13 — End: 2021-01-29

## 2021-01-13 NOTE — Telephone Encounter (Signed)
Pt last seen by Dr. Brown.  

## 2021-01-13 NOTE — Telephone Encounter (Signed)
Refill request

## 2021-01-13 NOTE — Progress Notes (Signed)
Office: [phone]  /  Fax: [phone]    Date: January 27, 2021   Appointment Start Time: 8:07am Duration: 26 minutes Provider: Glennie Isle, Psy.D. Type of Session: Individual Therapy  Location of Patient: Home Location of Provider: Provider's home (private office) Type of Contact: Telepsychological Visit via MyChart Video Visit  Session Content:This provider called Lori Knight at 8:02am as she did not present for today's appointment. A HIPAA compliant voicemail was left requesting a call back. She was observed joining shortly after. As such, today's appointment was initiated 7 minutes late. Lori Knight is a 50 y.o. female presenting for a follow-up appointment to address the previously established treatment goal of increasing coping skills. Today's appointment was a telepsychological visit due to COVID-19. Lori Knight provided verbal consent for today's telepsychological appointment and she is aware she is responsible for securing confidentiality on her end of the session. Prior to proceeding with today's appointment, Lori Knight's physical location at the time of this appointment was obtained as well a phone number she could be reached at in the event of technical difficulties. Lori Knight and this provider participated in today's telepsychological service.   This provider conducted a brief check-in. Lori Knight shared, "I had a talk with God," adding a reduction in work related stress. She further shared Topamax "works." Regarding eating, Lori Knight discussed challenges with meat based protein options resulting in her not consuming enough protein; she was encouraged to discuss further with Dr. Owens Shark. She discussed recent sugar cravings, noting in the past she would not crave sugar. This was further explored and she discussed starting to eat sugary foods to cover the "metallic taste" in her mouth. Notably, her providers are aware of the metallic taste and she recently underwent a procedure to determine the cause. When experiencing  sugar cravings, she discussed making better choices and engaging in learned skills despite the desire to sometimes to drive out to buy sweets. Positive reinforcement was provided and she agreed to discuss further with Dr. Owens Shark.    Session focused further on mindfulness to assist with coping. She discussed engaging in shared exercises. This provider discussed the utilization of YouTube for mindfulness exercises (e.g., exercises by Merri Ray). Lori Knight was receptive to today's appointment as evidenced by openness to sharing, responsiveness to feedback, and willingness to continue engaging in mindfulness exercises.  Mental Status Examination:  Appearance: well groomed and appropriate hygiene  Behavior: appropriate to circumstances Mood: euthymic Affect: mood congruent Speech: normal in rate, volume, and tone Eye Contact: appropriate Psychomotor Activity: unable to assess Gait: unable to assess Thought Process: linear, logical, and goal directed  Thought Content/Perception: denies suicidal and homicidal ideation, plan, and intent, no hallucinations, delusions, bizarre thinking or behavior reported or observed, and denies ideation and engagement in self-injurious behaviors Orientation: time, person, place, and purpose of appointment Memory/Concentration: memory, attention, language, and fund of knowledge intact  Insight/Judgment: fair  Interventions:  Conducted a brief chart review Provided empathic reflections and validation Conducted a risk assessment Psychoeducation provided regarding mindfulness Employed supportive psychotherapy interventions to facilitate reduced distress, and to improve coping skills with identified stressors  DSM-5 Diagnosis(es): F31.89 Other Specified Bipolar and Related Disorder, Emotional Eating Behaviors   Treatment Goal & Progress: During the initial appointment with this provider, the following treatment goal was established: increase coping skills. Lori Knight has  demonstrated progress in her goal as evidenced by increased awareness of hunger patterns and increased awareness of triggers for emotional eating behaviors. Lori Knight also continues to demonstrate willingness to engage in learned skill(s).  Plan: The  next appointment will be scheduled in three weeks, which will be via MyChart Video Visit. The next session will focus on working towards the established treatment goal. Additionally, Lori Knight's next appointment with her primary therapist is on February 10, 2021.

## 2021-01-16 ENCOUNTER — Ambulatory Visit: Payer: 59 | Admitting: Anesthesiology

## 2021-01-16 ENCOUNTER — Other Ambulatory Visit: Payer: Self-pay

## 2021-01-16 ENCOUNTER — Encounter: Payer: Self-pay | Admitting: Gastroenterology

## 2021-01-16 ENCOUNTER — Ambulatory Visit
Admission: RE | Admit: 2021-01-16 | Discharge: 2021-01-16 | Disposition: A | Discharge status: Home / Self Care | Payer: 59 | Attending: Gastroenterology | Admitting: Gastroenterology

## 2021-01-16 ENCOUNTER — Telehealth: Payer: 59

## 2021-01-16 ENCOUNTER — Encounter
Admission: RE | Disposition: A | Discharge status: Home / Self Care | Payer: Self-pay | Source: Home / Self Care | Attending: Gastroenterology

## 2021-01-16 DIAGNOSIS — K2970 Gastritis, unspecified, without bleeding: Secondary | ICD-10-CM

## 2021-01-16 DIAGNOSIS — K319 Disease of stomach and duodenum, unspecified: Secondary | ICD-10-CM | POA: Diagnosis not present

## 2021-01-16 DIAGNOSIS — Z8616 Personal history of COVID-19: Secondary | ICD-10-CM | POA: Diagnosis not present

## 2021-01-16 DIAGNOSIS — Z91040 Latex allergy status: Secondary | ICD-10-CM | POA: Insufficient documentation

## 2021-01-16 DIAGNOSIS — R12 Heartburn: Secondary | ICD-10-CM | POA: Diagnosis present

## 2021-01-16 DIAGNOSIS — Z888 Allergy status to other drugs, medicaments and biological substances status: Secondary | ICD-10-CM | POA: Insufficient documentation

## 2021-01-16 DIAGNOSIS — K219 Gastro-esophageal reflux disease without esophagitis: Secondary | ICD-10-CM | POA: Diagnosis not present

## 2021-01-16 DIAGNOSIS — Z87891 Personal history of nicotine dependence: Secondary | ICD-10-CM | POA: Insufficient documentation

## 2021-01-16 DIAGNOSIS — Z79899 Other long term (current) drug therapy: Secondary | ICD-10-CM | POA: Diagnosis not present

## 2021-01-16 DIAGNOSIS — Z7984 Long term (current) use of oral hypoglycemic drugs: Secondary | ICD-10-CM | POA: Diagnosis not present

## 2021-01-16 DIAGNOSIS — Z9049 Acquired absence of other specified parts of digestive tract: Secondary | ICD-10-CM | POA: Diagnosis not present

## 2021-01-16 DIAGNOSIS — Z79890 Hormone replacement therapy (postmenopausal): Secondary | ICD-10-CM | POA: Insufficient documentation

## 2021-01-16 HISTORY — PX: ESOPHAGOGASTRODUODENOSCOPY (EGD) WITH PROPOFOL: SHX5813

## 2021-01-16 LAB — GLUCOSE, CAPILLARY
Glucose-Capillary: 145 mg/dL — ABNORMAL HIGH (ref 70–99)
Glucose-Capillary: 120 mg/dL — ABNORMAL HIGH (ref 70–99)

## 2021-01-16 SURGERY — ESOPHAGOGASTRODUODENOSCOPY (EGD) WITH PROPOFOL
Anesthesia: General

## 2021-01-16 MED ORDER — LACTATED RINGERS IV SOLN: INTRAVENOUS | Status: DC

## 2021-01-16 MED ORDER — ACETAMINOPHEN 325 MG PO TABS: 325.0000 mg | ORAL_TABLET | ORAL | Status: DC | PRN

## 2021-01-16 MED ORDER — ONDANSETRON HCL 4 MG/2ML IJ SOLN: 4.0000 mg | Freq: Once | INTRAMUSCULAR | Status: DC | PRN

## 2021-01-16 MED ORDER — PROPOFOL 10 MG/ML IV BOLUS
INTRAVENOUS | Status: DC | PRN
  Administered 2021-01-16: 150 mg via INTRAVENOUS
  Administered 2021-01-16: 50 mg via INTRAVENOUS

## 2021-01-16 MED ORDER — SODIUM CHLORIDE 0.9 % IV SOLN: INTRAVENOUS | Status: DC

## 2021-01-16 MED ORDER — ACETAMINOPHEN 160 MG/5ML PO SOLN: 325.0000 mg | ORAL | Status: DC | PRN

## 2021-01-16 SURGICAL SUPPLY — 8 items
BLOCK BITE 60FR ADLT L/F GRN (MISCELLANEOUS) ×2 IMPLANT
FORCEPS BIOP RAD 4 LRG CAP 4 (CUTTING FORCEPS) ×1 IMPLANT
GOWN CVR UNV OPN BCK APRN NK (MISCELLANEOUS) ×2 IMPLANT
GOWN ISOL THUMB LOOP REG UNIV (MISCELLANEOUS) ×4
KIT PRC NS LF DISP ENDO (KITS) ×1 IMPLANT
KIT PROCEDURE OLYMPUS (KITS) ×2
MANIFOLD NEPTUNE II (INSTRUMENTS) ×2 IMPLANT
WATER STERILE IRR 250ML POUR (IV SOLUTION) ×2 IMPLANT

## 2021-01-16 NOTE — Anesthesia Postprocedure Evaluation (Signed)
Anesthesia Post Note  Patient: Lori Knight  Procedure(s) Performed: ESOPHAGOGASTRODUODENOSCOPY (EGD) WITH PROPOFOL     Patient location during evaluation: PACU Anesthesia Type: General Level of consciousness: awake Pain management: pain level controlled Vital Signs Assessment: post-procedure vital signs reviewed and stable Respiratory status: respiratory function stable Cardiovascular status: stable Postop Assessment: no signs of nausea or vomiting Anesthetic complications: no   No notable events documented.  Veda Canning

## 2021-01-16 NOTE — Op Note (Signed)
[facility] Gastroenterology Patient Name: Lori Knight Procedure Date: 01/16/2021 8:46 AM MRN: [id number] Account #: [id number] Date of Birth: 29-May-1971 Admit Type: Outpatient Age: 50 Room: [facility] OR ROOM 01 Gender: Female Note Status: Finalized Procedure:             Upper GI endoscopy Indications:           Heartburn Providers:             [name] MD, MD Referring MD:          Barbaraann Faster. Cannady (Referring MD) Medicines:             Propofol per Anesthesia Complications:         No immediate complications. Procedure:             Pre-Anesthesia Assessment:                        - Prior to the procedure, a History and Physical was                         performed, and patient medications and allergies were                         reviewed. The patient's tolerance of previous                         anesthesia was also reviewed. The risks and benefits                         of the procedure and the sedation options and risks                         were discussed with the patient. All questions were                         answered, and informed consent was obtained. Prior                         Anticoagulants: The patient has taken no previous                         anticoagulant or antiplatelet agents. ASA Grade                         Assessment: II - A patient with mild systemic disease.                         After reviewing the risks and benefits, the patient                         was deemed in satisfactory condition to undergo the                         procedure.                        After obtaining informed consent, the endoscope was  passed under direct vision. Throughout the procedure,                         the patient's blood pressure, pulse, and oxygen                         saturations were monitored continuously. The Endoscope                         was introduced through the mouth, and advanced to the                          second part of duodenum. The upper GI endoscopy was                         accomplished without difficulty. The patient tolerated                         the procedure well. Findings:      The examined esophagus was normal. Two biopsies were obtained with cold       forceps for histology in the middle third of the esophagus.      Localized mild inflammation characterized by erythema was found in the       gastric antrum. Biopsies were taken with a cold forceps for histology.      The examined duodenum was normal. Impression:            - Normal esophagus.                        - Gastritis. Biopsied.                        - Normal examined duodenum.                        - Biopsies performed in the middle third of the                         esophagus. Recommendation:        - Discharge patient to home.                        - Resume previous diet.                        - Continue present medications.                        - Await pathology results. Procedure Code(s):     --- Professional ---                        [phone], Esophagogastroduodenoscopy, flexible,                         transoral; with biopsy, single or multiple Diagnosis Code(s):     --- Professional ---                        R12, Heartburn  K29.70, Gastritis, unspecified, without bleeding CPT copyright 2019 American Medical Association. All rights reserved. The codes documented in this report are preliminary and upon coder review may  be revised to meet current compliance requirements. [name] MD, MD 01/16/2021 8:59:53 AM This report has been signed electronically. Number of Addenda: 0 Note Initiated On: 01/16/2021 8:46 AM Estimated Blood Loss:  Estimated blood loss: none.      [facility]

## 2021-01-16 NOTE — Transfer of Care (Signed)
Immediate Anesthesia Transfer of Care Note  Patient: Lori Knight  Procedure(s) Performed: ESOPHAGOGASTRODUODENOSCOPY (EGD) WITH PROPOFOL  Patient Location: PACU  Anesthesia Type: General  Level of Consciousness: awake, alert  and patient cooperative  Airway and Oxygen Therapy: Patient Spontanous Breathing and Patient connected to supplemental oxygen  Post-op Assessment: Post-op Vital signs reviewed, Patient's Cardiovascular Status Stable, Respiratory Function Stable, Patent Airway and No signs of Nausea or vomiting  Post-op Vital Signs: Reviewed and stable  Complications: No notable events documented.

## 2021-01-16 NOTE — H&P (Signed)
Lori Lame, MD [facility] [address], [place], [name] [address] Phone:[phone] Fax : [phone]  Primary Care Physician:  [name], NP Primary Gastroenterologist:  Dr. [name]  Pre-Procedure History & Physical: HPI:  Lori Knight is a 50 y.o. female is here for an endoscopy.   Past Medical History:  Diagnosis Date   Adult hypothyroidism 06/21/2012   Anxiety    Back pain    Benign paroxysmal positional nystagmus    Bilateral polycystic ovarian syndrome 06/21/2012   Biliary calculus with cholecystitis 08/14/2012   Bipolar 1 disorder (HCC)    BP (high blood pressure) 02/14/2014   Bronchitis    Constipation    COVID-19 07/2020   Depression    Diabetes mellitus ([place]) 08/14/2012   Diabetes mellitus without complication ([place])    pre-diabetic   Dysrhythmia    wore heart monitor 2016. Corrected by changing Levothyroxine dose.   Edema of both lower extremities    Gallbladder problem    GERD (gastroesophageal reflux disease)    Heart murmur    followed by PCP-AS A CHILD-ASYMPTOMATIC   Hyperlipidemia    Hyperprolactinemia ([place]) 06/21/2012   Hypertension    Hypothyroid    Hypoxia 04/17/2018   Joint pain    Kidney problem    Meningioma (HCC)    Motion sickness    carnival rides   Neuropathy    PONV (postoperative nausea and vomiting)    Low BP after sinus surgery. WAKES UP CRYING   Shortness of breath dyspnea    stairs. related to wt.   Sleep apnea    has CPAP. has not used since 2011   Swallowing difficulty    Vertigo 2016   none recently   Vitamin D deficiency     Past Surgical History:  Procedure Laterality Date   ABDOMINAL HYSTERECTOMY  2012   cervical dysplasia/ovaries remian   CHOLECYSTECTOMY     COLONOSCOPY WITH PROPOFOL N/A 06/19/2015   Procedure: COLONOSCOPY WITH PROPOFOL;  Surgeon: Lori Lame, MD;  Location: [facility];  Service: Endoscopy;  Laterality: N/A;  Diabetic - oral meds    DENTAL SURGERY     ETHMOIDECTOMY  Bilateral 04/17/2018   Procedure: ETHMOIDECTOMY;  Surgeon: [name], MD;  Location: [facility];  Service: ENT;  Laterality: Bilateral;   GALLBLADDER SURGERY     IMAGE GUIDED SINUS SURGERY N/A 04/17/2018   Procedure: IMAGE GUIDED SINUS SURGERY;  Surgeon: [name], MD;  Location: [facility];  Service: ENT;  Laterality: N/A;   MAXILLARY ANTROSTOMY Bilateral 04/17/2018   Procedure: MAXILLARY ANTROSTOMY;  Surgeon: [name], MD;  Location: [facility];  Service: ENT;  Laterality: Bilateral;   NASAL SEPTOPLASTY W/ TURBINOPLASTY Bilateral 04/17/2018   Procedure: NASAL SEPTOPLASTY WITH TURBINATE REDUCTION;  Surgeon: [name], MD;  Location: [facility];  Service: ENT;  Laterality: Bilateral;   TRANSURETHRAL RESECTION OF BLADDER TUMOR N/A 04/17/2018   Procedure: NTRANSURETHRAL RESECTION OF BLADDER TUMOR (TURBT) WITH GEMCITABINE;  Surgeon: [name], MD;  Location: [facility];  Service: Urology;  Laterality: N/A;    Prior to Admission medications   Medication Sig Start Date End Date Taking? Authorizing Provider  acetaminophen (TYLENOL) 500 MG tablet Take 500 mg by mouth every 6 (six) hours as needed.   Yes [provider]  amLODipine (NORVASC) 5 MG tablet TAKE 1 TABLET BY MOUTH EVERY DAY 12/09/20  Yes Cannady, Jolene T, NP  Ascorbic Acid (VITAMIN C) 1000 MG tablet Take 1,000 mg by mouth daily.   Yes [provider]  atorvastatin (LIPITOR) 20 MG tablet Take 20 mg by mouth daily.   Yes [provider]  Cholecalciferol 5000 units capsule Take 1 capsule (5,000 Units total) by mouth daily. 11/28/15  Yes Plonk, Gwyndolyn Saxon, MD  Dulaglutide 1.5 MG/0.5ML SOPN Inject 1.5 mg into the skin once a week. 08/28/18  Yes [provider]  lamoTRIgine (LAMICTAL) 150 MG tablet Take 300 mg by mouth daily. 07/03/20  Yes [provider]  levothyroxine (SYNTHROID, LEVOTHROID) 175 MCG tablet TAKE 1 TABLET (175 MCG TOTAL) BY MOUTH DAILY. 09/05/17  Yes [name], MD  losartan  (COZAAR) 100 MG tablet TAKE 1/2 TABLET (50 MG TOTAL) BY MOUTH 2 (TWO) TIMES DAILY. 12/16/20  Yes Cannady, Jolene T, NP  metFORMIN (GLUCOPHAGE) 500 MG tablet Take 1 tablet (500 mg total) by mouth 3 (three) times daily. 07/10/18  Yes [name], MD  Omega-3 Fatty Acids (FISH OIL) 1200 MG CAPS Take 1,200 mg by mouth daily.    Yes [provider]  omeprazole (PRILOSEC) 40 MG capsule Take 1 capsule (40 mg total) by mouth daily. 04/23/20  Yes Cannady, Jolene T, NP  ondansetron (ZOFRAN ODT) 4 MG disintegrating tablet Take 1 tablet (4 mg total) by mouth every 8 (eight) hours as needed for nausea or vomiting. 07/09/19  Yes Lamptey, Myrene Galas, MD  topiramate (TOPAMAX) 25 MG tablet Take 1 tablet (25 mg total) by mouth 2 (two) times daily. 01/13/21  Yes Jearld Lesch A, DO  albuterol (VENTOLIN HFA) 108 (90 Base) MCG/ACT inhaler Inhale 2 puffs into the lungs every 4 (four) hours as needed for wheezing or shortness of breath. 08/11/20   [provider]  CONTOUR NEXT TEST test strip USE TO CHECK BS UP TO 3 TIMES DAILY FOR DIABETES DX: E11.9 06/13/19   [name], MD  meloxicam (MOBIC) 15 MG tablet Take 1 tablet (15 mg total) by mouth daily. 01/09/21   [name], NP    Allergies as of 01/05/2021 - Review Complete 01/05/2021  Allergen Reaction Noted   Hctz [hydrochlorothiazide] Cough 12/26/2017   Lisinopril Cough 03/01/2015   Latex Rash 06/17/2015   Prednisone Anxiety 05/05/2018    Family History  Problem Relation Age of Onset   Diabetes Maternal Grandmother    Hypertension Maternal Grandmother    Hyperlipidemia Maternal Grandmother    Hypothyroidism Maternal Grandmother    Heart disease Maternal Grandmother    Diabetes Mother    Hypertension Mother    Hypothyroidism Mother    Bipolar disorder Mother    Heart disease Mother    Kidney disease Mother    Thyroid disease Mother    Depression Mother    Anxiety disorder Mother    Drug abuse Mother    Obesity Mother     Alcohol abuse Father    Depression Daughter    Breast cancer Neg Hx     Social History   Socioeconomic History   Marital status: Single    Spouse name: Lori Knight   Number of children: Not on file   Years of education: Not on file   Highest education level: Not on file  Occupational History   Occupation: Real Conservation officer, historic buildings  Tobacco Use   Smoking status: Former    Packs/day: 1.00    Years: 10.00    Pack years: 10.00    Types: Cigarettes    Quit date: 07/12/2002    Years since quitting: 18.5   Smokeless tobacco: Never   Tobacco comments:  quit 2004  Vaping Use   Vaping Use: Never used  Substance and Sexual Activity   Alcohol use: Yes    Alcohol/week: 0.0 standard drinks    Comment: rarely   Drug use: No   Sexual activity: Yes  Other Topics Concern   Not on file  Social History Narrative   Not on file   Social Determinants of Health   Financial Resource Strain: Not on file  Food Insecurity: Not on file  Transportation Needs: Not on file  Physical Activity: Not on file  Stress: Not on file  Social Connections: Not on file  Intimate Partner Violence: Not on file    Review of Systems: See HPI, otherwise negative ROS  Physical Exam: BP (!) 145/86   Pulse 79   Temp 98.1 F (36.7 C) (Temporal)   Resp 18   Ht 5\' 7"  (1.702 m)   Wt 126.1 kg   SpO2 95%   BMI 43.54 kg/m  General:   Alert,  pleasant and cooperative in NAD Head:  Normocephalic and atraumatic. Neck:  Supple; no masses or thyromegaly. Lungs:  Clear throughout to auscultation.    Heart:  Regular rate and rhythm. Abdomen:  Soft, nontender and nondistended. Normal bowel sounds, without guarding, and without rebound.   Neurologic:  Alert and  oriented x4;  grossly normal neurologically.  Impression/Plan: Liann Spaeth is here for an endoscopy to be performed for dysphagia  Risks, benefits, limitations, and alternatives regarding  endoscopy have been reviewed with the patient.  Questions have  been answered.  All parties agreeable.   Lori Lame, MD  01/16/2021, 8:46 AM

## 2021-01-16 NOTE — Interval H&P Note (Signed)
Lori Lame, MD [facility] [address], [place], [place] [address] Phone:[phone] Fax : [phone]  Primary Care Physician:  Lori Lick, NP Primary Gastroenterologist:  Dr. [name]  Pre-Procedure History & Physical: HPI:  Lori Knight is a 50 y.o. female is here for an endoscopy.   Past Medical History:  Diagnosis Date   Adult hypothyroidism 06/21/2012   Anxiety    Back pain    Benign paroxysmal positional nystagmus    Bilateral polycystic ovarian syndrome 06/21/2012   Biliary calculus with cholecystitis 08/14/2012   Bipolar 1 disorder (HCC)    BP (high blood pressure) 02/14/2014   Bronchitis    Constipation    COVID-19 07/2020   Depression    Diabetes mellitus ([place]) 08/14/2012   Diabetes mellitus without complication ([place])    pre-diabetic   Dysrhythmia    wore heart monitor 2016. Corrected by changing Levothyroxine dose.   Edema of both lower extremities    Gallbladder problem    GERD (gastroesophageal reflux disease)    Heart murmur    followed by PCP-AS A CHILD-ASYMPTOMATIC   Hyperlipidemia    Hyperprolactinemia ([place]) 06/21/2012   Hypertension    Hypothyroid    Hypoxia 04/17/2018   Joint pain    Kidney problem    Meningioma (HCC)    Motion sickness    carnival rides   Neuropathy    PONV (postoperative nausea and vomiting)    Low BP after sinus surgery. WAKES UP CRYING   Shortness of breath dyspnea    stairs. related to wt.   Sleep apnea    has CPAP. has not used since 2011   Swallowing difficulty    Vertigo 2016   none recently   Vitamin D deficiency     Past Surgical History:  Procedure Laterality Date   ABDOMINAL HYSTERECTOMY  2012   cervical dysplasia/ovaries remian   CHOLECYSTECTOMY     COLONOSCOPY WITH PROPOFOL N/A 06/19/2015   Procedure: COLONOSCOPY WITH PROPOFOL;  Surgeon: Lori Lame, MD;  Location: [facility];  Service: Endoscopy;  Laterality: N/A;  Diabetic - oral meds    DENTAL SURGERY     ETHMOIDECTOMY  Bilateral 04/17/2018   Procedure: ETHMOIDECTOMY;  Surgeon: [name], MD;  Location: [facility];  Service: ENT;  Laterality: Bilateral;   GALLBLADDER SURGERY     IMAGE GUIDED SINUS SURGERY N/A 04/17/2018   Procedure: IMAGE GUIDED SINUS SURGERY;  Surgeon: [name], MD;  Location: [facility];  Service: ENT;  Laterality: N/A;   MAXILLARY ANTROSTOMY Bilateral 04/17/2018   Procedure: MAXILLARY ANTROSTOMY;  Surgeon: [name], MD;  Location: [facility];  Service: ENT;  Laterality: Bilateral;   NASAL SEPTOPLASTY W/ TURBINOPLASTY Bilateral 04/17/2018   Procedure: NASAL SEPTOPLASTY WITH TURBINATE REDUCTION;  Surgeon: [name], MD;  Location: [facility];  Service: ENT;  Laterality: Bilateral;   TRANSURETHRAL RESECTION OF BLADDER TUMOR N/A 04/17/2018   Procedure: NTRANSURETHRAL RESECTION OF BLADDER TUMOR (TURBT) WITH GEMCITABINE;  Surgeon: [name], MD;  Location: [facility];  Service: Urology;  Laterality: N/A;    Prior to Admission medications   Medication Sig Start Date End Date Taking? Authorizing Provider  acetaminophen (TYLENOL) 500 MG tablet Take 500 mg by mouth every 6 (six) hours as needed.   Yes [provider]  amLODipine (NORVASC) 5 MG tablet TAKE 1 TABLET BY MOUTH EVERY DAY 12/09/20  Yes Cannady, Jolene T, NP  Ascorbic Acid (VITAMIN C) 1000 MG tablet Take 1,000 mg by mouth daily.   Yes [provider]  atorvastatin (LIPITOR) 20 MG tablet Take 20 mg by mouth daily.   Yes [provider]  Cholecalciferol 5000 units capsule Take 1 capsule (5,000 Units total) by mouth daily. 11/28/15  Yes Plonk, Gwyndolyn Saxon, MD  Dulaglutide 1.5 MG/0.5ML SOPN Inject 1.5 mg into the skin once a week. 08/28/18  Yes [provider]  lamoTRIgine (LAMICTAL) 150 MG tablet Take 300 mg by mouth daily. 07/03/20  Yes [provider]  levothyroxine (SYNTHROID, LEVOTHROID) 175 MCG tablet TAKE 1 TABLET (175 MCG TOTAL) BY MOUTH DAILY. 09/05/17  Yes [name], MD  losartan  (COZAAR) 100 MG tablet TAKE 1/2 TABLET (50 MG TOTAL) BY MOUTH 2 (TWO) TIMES DAILY. 12/16/20  Yes Cannady, Jolene T, NP  metFORMIN (GLUCOPHAGE) 500 MG tablet Take 1 tablet (500 mg total) by mouth 3 (three) times daily. Patient taking differently: Take 1,000 mg by mouth 2 (two) times daily. 07/10/18  Yes [name], MD  Omega-3 Fatty Acids (FISH OIL) 1200 MG CAPS Take 1,200 mg by mouth daily.    Yes [provider]  omeprazole (PRILOSEC) 40 MG capsule Take 1 capsule (40 mg total) by mouth daily. 04/23/20  Yes Cannady, Jolene T, NP  ondansetron (ZOFRAN ODT) 4 MG disintegrating tablet Take 1 tablet (4 mg total) by mouth every 8 (eight) hours as needed for nausea or vomiting. 07/09/19  Yes Lamptey, Myrene Galas, MD  topiramate (TOPAMAX) 25 MG tablet Take 1 tablet (25 mg total) by mouth 2 (two) times daily. 01/13/21  Yes Jearld Lesch A, DO  albuterol (VENTOLIN HFA) 108 (90 Base) MCG/ACT inhaler Inhale 2 puffs into the lungs every 4 (four) hours as needed for wheezing or shortness of breath. 08/11/20   [provider]  CONTOUR NEXT TEST test strip USE TO CHECK BS UP TO 3 TIMES DAILY FOR DIABETES DX: E11.9 06/13/19   [name], MD  meloxicam (MOBIC) 15 MG tablet Take 1 tablet (15 mg total) by mouth daily. 01/09/21   [name], NP    Allergies as of 01/05/2021 - Review Complete 01/05/2021  Allergen Reaction Noted   Hctz [hydrochlorothiazide] Cough 12/26/2017   Lisinopril Cough 03/01/2015   Latex Rash 06/17/2015   Prednisone Anxiety 05/05/2018    Family History  Problem Relation Age of Onset   Diabetes Maternal Grandmother    Hypertension Maternal Grandmother    Hyperlipidemia Maternal Grandmother    Hypothyroidism Maternal Grandmother    Heart disease Maternal Grandmother    Diabetes Mother    Hypertension Mother    Hypothyroidism Mother    Bipolar disorder Mother    Heart disease Mother    Kidney disease Mother    Thyroid disease Mother    Depression Mother     Anxiety disorder Mother    Drug abuse Mother    Obesity Mother    Alcohol abuse Father    Depression Daughter    Breast cancer Neg Hx     Social History   Socioeconomic History   Marital status: Single    Spouse name: [name]   Number of children: Not on file   Years of education: Not on file   Highest education level: Not on file  Occupational History   Occupation: Real Conservation officer, historic buildings  Tobacco Use   Smoking status: Former    Packs/day: 1.00    Years: 10.00    Pack years: 10.00    Types: Cigarettes    Quit date: 07/12/2002    Years since quitting: 18.5  Smokeless tobacco: Never   Tobacco comments:    quit 2004  Vaping Use   Vaping Use: Never used  Substance and Sexual Activity   Alcohol use: Yes    Alcohol/week: 0.0 standard drinks    Comment: rarely   Drug use: No   Sexual activity: Yes  Other Topics Concern   Not on file  Social History Narrative   Not on file   Social Determinants of Health   Financial Resource Strain: Not on file  Food Insecurity: Not on file  Transportation Needs: Not on file  Physical Activity: Not on file  Stress: Not on file  Social Connections: Not on file  Intimate Partner Violence: Not on file    Review of Systems: See HPI, otherwise negative ROS  Physical Exam: BP (!) 145/86   Pulse 79   Temp 98.1 F (36.7 C) (Temporal)   Resp 18   Ht 5\' 7"  (1.702 m)   Wt 126.1 kg   SpO2 95%   BMI 43.54 kg/m  General:   Alert,  pleasant and cooperative in NAD Head:  Normocephalic and atraumatic. Neck:  Supple; no masses or thyromegaly. Lungs:  Clear throughout to auscultation.    Heart:  Regular rate and rhythm. Abdomen:  Soft, nontender and nondistended. Normal bowel sounds, without guarding, and without rebound.   Neurologic:  Alert and  oriented x4;  grossly normal neurologically.  Impression/Plan: Lori Knight is here for an endoscopy to be performed for dysphagia  Risks, benefits, limitations, and alternatives  regarding  endoscopy have been reviewed with the patient.  Questions have been answered.  All parties agreeable.   Lori Lame, MD  01/16/2021, 7:58 AM

## 2021-01-16 NOTE — Anesthesia Preprocedure Evaluation (Signed)
Anesthesia Evaluation  Patient identified by MRN, date of birth, ID band Patient awake    Reviewed: Allergy & Precautions, NPO status   Airway Mallampati: II  TM Distance: >3 FB     Dental   Pulmonary sleep apnea , former smoker,    Pulmonary exam normal        Cardiovascular hypertension,  Rhythm:Regular Rate:Normal     Neuro/Psych PSYCHIATRIC DISORDERS Depression Bipolar Disorder    GI/Hepatic GERD  ,  Endo/Other  diabetesHypothyroidism Morbid obesity  Renal/GU      Musculoskeletal   Abdominal   Peds  Hematology   Anesthesia Other Findings   Reproductive/Obstetrics                             Anesthesia Physical Anesthesia Plan  ASA: 3  Anesthesia Plan: General   Post-op Pain Management:    Induction: Intravenous  PONV Risk Score and Plan: Propofol infusion, TIVA and Treatment may vary due to age or medical condition  Airway Management Planned: Natural Airway and Nasal Cannula  Additional Equipment:   Intra-op Plan:   Post-operative Plan:   Informed Consent: I have reviewed the patients History and Physical, chart, labs and discussed the procedure including the risks, benefits and alternatives for the proposed anesthesia with the patient or authorized representative who has indicated his/her understanding and acceptance.       Plan Discussed with: CRNA  Anesthesia Plan Comments:         Anesthesia Quick Evaluation

## 2021-01-16 NOTE — Anesthesia Procedure Notes (Signed)
Date/Time: 01/16/2021 8:52 AM Performed by: Cameron Ali, CRNA Pre-anesthesia Checklist: Patient identified, Emergency Drugs available, Suction available, Timeout performed and Patient being monitored Patient Re-evaluated:Patient Re-evaluated prior to induction Oxygen Delivery Method: Nasal cannula Placement Confirmation: positive ETCO2

## 2021-01-19 ENCOUNTER — Encounter: Payer: Self-pay | Admitting: Gastroenterology

## 2021-01-20 ENCOUNTER — Telehealth: Payer: 59

## 2021-01-20 ENCOUNTER — Telehealth: Payer: Self-pay | Admitting: General Practice

## 2021-01-20 NOTE — Telephone Encounter (Signed)
  Care Management   Follow Up Note   01/20/2021 Name: Lori Knight MRN: [id number] DOB: 26-Nov-1970   Referred by: [name], NP Reason for referral : Care Coordination (RNCM: Follow up for Chronic Disease Management and Care Coordination needs )   An unsuccessful telephone outreach was attempted today. The patient was referred to the case management team for assistance with care management and care coordination.   Follow Up Plan: A HIPPA compliant phone message was left for the patient providing contact information and requesting a return call.   Noreene Larsson RN, MSN, [facility] Mobile: [phone]

## 2021-01-22 LAB — SURGICAL PATHOLOGY

## 2021-01-27 ENCOUNTER — Telehealth (INDEPENDENT_AMBULATORY_CARE_PROVIDER_SITE_OTHER): Payer: 59 | Admitting: Psychology

## 2021-01-27 DIAGNOSIS — F3189 Other bipolar disorder: Secondary | ICD-10-CM | POA: Diagnosis not present

## 2021-01-29 ENCOUNTER — Encounter (INDEPENDENT_AMBULATORY_CARE_PROVIDER_SITE_OTHER): Payer: Self-pay | Admitting: Bariatrics

## 2021-01-29 ENCOUNTER — Other Ambulatory Visit: Payer: Self-pay

## 2021-01-29 ENCOUNTER — Ambulatory Visit (INDEPENDENT_AMBULATORY_CARE_PROVIDER_SITE_OTHER): Payer: 59 | Admitting: Bariatrics

## 2021-01-29 VITALS — BP 135/87 | HR 78 | Temp 98.5°F | Ht 67.0 in | Wt 281.0 lb

## 2021-01-29 DIAGNOSIS — F5089 Other specified eating disorder: Secondary | ICD-10-CM

## 2021-01-29 DIAGNOSIS — E1169 Type 2 diabetes mellitus with other specified complication: Secondary | ICD-10-CM

## 2021-01-29 DIAGNOSIS — E669 Obesity, unspecified: Secondary | ICD-10-CM

## 2021-01-29 DIAGNOSIS — Z9189 Other specified personal risk factors, not elsewhere classified: Secondary | ICD-10-CM | POA: Diagnosis not present

## 2021-01-29 DIAGNOSIS — Z6841 Body Mass Index (BMI) 40.0 and over, adult: Secondary | ICD-10-CM

## 2021-01-29 MED ORDER — TOPIRAMATE 25 MG PO TABS: 25.0000 mg | ORAL_TABLET | Freq: Two times a day (BID) | ORAL | 0 refills | Status: DC

## 2021-01-29 NOTE — Telephone Encounter (Signed)
Patient declined rescheduling

## 2021-02-03 NOTE — Progress Notes (Signed)
Office: [phone]  /  Fax: [phone]    Date: February 17, 2021   Appointment Start Time: 8:03am Duration: 25 minutes Provider: Glennie Isle, Psy.D. Type of Session: Individual Therapy  Location of Patient: Home Location of Provider: Provider's home (private office) Type of Contact: Telepsychological Visit via MyChart Video Visit  Session Content:This provider called Lori Knight at 8:02am as she did not present for today's appointment. She stated she was attempting to join. As such, today's appointment was initiated 3 minutes late. Lori Knight is a 50 y.o. female presenting for a follow-up appointment to address the previously established treatment goal of increasing coping skills. Today's appointment was a telepsychological visit due to COVID-19. Lori Knight provided verbal consent for today's telepsychological appointment and she is aware she is responsible for securing confidentiality on her end of the session. Prior to proceeding with today's appointment, Lori Knight's physical location at the time of this appointment was obtained as well a phone number she could be reached at in the event of technical difficulties. Lori Knight and this provider participated in today's telepsychological service.   This provider conducted a brief check-in. Lori Knight stated things are going well with work. Regarding eating, she noted, "It depends on the day." She discussed a reduction in overeating and emotional eating behaviors, adding she is focusing on making better choices. Positive reinforcement was provided. Lori Knight discussed purchasing snacks when out in the filed for work. Thus, this provider and Lori Knight discussed packing snacks ahead of time. This provider and [name] discussed the importance of protein intake as it was reflected she is likely not consuming enough protein. As such, this provider recommended Lori Knight ask herself if what she is choosing to eat has any protein. She was receptive. Furthermore, termination planning was discussed  based on progress to date and [name] meeting with her primary therapist regularly. Lori Knight was receptive to a follow-up appointment in 3-4 weeks and an additional follow-up/termination appointment in 3-4 weeks after that. Overall, Lori Knight was receptive to today's appointment as evidenced by openness to sharing, responsiveness to feedback, and willingness to implement discussed strategies .  Mental Status Examination:  Appearance: well groomed and appropriate hygiene  Behavior: appropriate to circumstances Mood: euthymic Affect: mood congruent Speech: normal in rate, volume, and tone Eye Contact: appropriate Psychomotor Activity: unable to assess Gait: unable to assess Thought Process: linear, logical, and goal directed  Thought Content/Perception: no hallucinations, delusions, bizarre thinking or behavior reported or observed and no evidence or endorsement of suicidal and homicidal ideation, plan, and intent Orientation: time, person, place, and purpose of appointment Memory/Concentration: memory, attention, language, and fund of knowledge intact  Insight/Judgment: fair  Interventions:  Conducted a brief chart review Provided empathic reflections and validation Engaged patient in problem solving Employed motivational interviewing skills to assess patient's willingness/desire to adhere to recommended medical treatments and assignments Employed supportive psychotherapy interventions to facilitate reduced distress, and to improve coping skills with identified stressors  DSM-5 Diagnosis(es): F31.89 Other Specified Bipolar and Related Disorder, Emotional Eating Behaviors   Treatment Goal & Progress: During the initial appointment with this provider, the following treatment goal was established: increase coping skills. Lori Knight has demonstrated progress in her goal as evidenced by increased awareness of hunger patterns, increased awareness of triggers for emotional eating behaviors, and reduction in  emotional eating behaviors . Lori Knight also continues to demonstrate willingness to engage in learned skill(s).  Plan: The next appointment will be scheduled in 3-4 weeks, which will be via MyChart Video Visit. The next session will focus on working  towards the established treatment goal. Additionally, Lori Knight's next appointment with [facility] is on March 05, 2021.

## 2021-02-04 ENCOUNTER — Ambulatory Visit: Payer: 59 | Admitting: Nurse Practitioner

## 2021-02-05 ENCOUNTER — Other Ambulatory Visit: Payer: Self-pay | Admitting: Nurse Practitioner

## 2021-02-05 ENCOUNTER — Encounter (INDEPENDENT_AMBULATORY_CARE_PROVIDER_SITE_OTHER): Payer: Self-pay | Admitting: Bariatrics

## 2021-02-05 ENCOUNTER — Other Ambulatory Visit (INDEPENDENT_AMBULATORY_CARE_PROVIDER_SITE_OTHER): Payer: Self-pay | Admitting: Bariatrics

## 2021-02-05 DIAGNOSIS — F5089 Other specified eating disorder: Secondary | ICD-10-CM

## 2021-02-05 NOTE — Progress Notes (Signed)
Chief Complaint:   OBESITY Lori Knight is here to discuss her progress with her obesity treatment plan along with follow-up of her obesity related diagnoses. Lori Knight is on the Category 3 Plan and states she is following her eating plan approximately 40% of the time. Lori Knight states she is walking on treadmill and PT 5-20 minutes 2-3 times per week.  Today's visit was #: 7 Starting weight: 289 lbs Starting date: 09/11/2020 Today's weight: 281 lbs Today's date: 01/29/2021 Total lbs lost to date: 8 Total lbs lost since last in-office visit: 0  Interim History: Lori Knight is up 1 lb since her last visit. She is eating more sugar.  Subjective:   1. Other disorder of eating Lori Knight is taking Topamax for emotional eating. She is seeing Dr. Mallie Mussel.  2. Type 2 diabetes mellitus with obesity (Lori Knight) Lori Knight is taking Metformin.  3. At risk for dehydration Lori Knight is at risk for dehydration due to exercise, obesity, and weather.  Assessment/Plan:   1. Other disorder of eating Behavior modification techniques were discussed today to help Lori Knight deal with her emotional/non-hunger eating behaviors.  Orders and follow up as documented in patient record.   Refill- topiramate (TOPAMAX) 25 MG tablet; Take 1 tablet (25 mg total) by mouth 2 (two) times daily.  Dispense: 60 tablet; Refill: 0  2. Type 2 diabetes mellitus with obesity (HCC) [name], neuropathy, limb loss, blindness, coronary artery disease, and death. Intensive lifestyle modification including diet, exercise and weight loss are the first line of treatment for diabetes. Continue Metformin.  3. At risk for dehydration Lori Knight was given approximately 15 minutes dehydration prevention counseling today. Lori Knight is at risk for dehydration due to weight loss and current medication(s). She was encouraged to hydrate and monitor fluid status to avoid dehydration as  well as weight loss plateaus.    4. Obesity with current BMI of 44  Lori Knight is currently in the action stage of change. As such, her goal is to continue with weight loss efforts. She has agreed to the Category 3 Plan.   Lori Knight will adhere closely to the plan. Mindful eating Handout: Smart Fruits  Exercise goals:  As is  Behavioral modification strategies: increasing lean protein intake, decreasing simple carbohydrates, increasing vegetables, increasing water intake, decreasing eating out, no skipping meals, meal planning and cooking strategies, keeping healthy foods in the home, and planning for success.  Lori Knight has agreed to follow-up with our clinic in 4 weeks. She was informed of the importance of frequent follow-up visits to maximize her success with intensive lifestyle modifications for her multiple health conditions.   Objective:   Blood pressure 135/87, pulse 78, temperature 98.5 F (36.9 C), height '5\' 7"'$  (1.702 m), weight 281 lb (127.5 kg), SpO2 96 %. Body mass index is 44.01 kg/m.  General: Cooperative, alert, well developed, in no acute distress. HEENT: Conjunctivae and lids unremarkable. Cardiovascular: Regular rhythm.  Lungs: Normal work of breathing. Neurologic: No focal deficits.   Lab Results  Component Value Date   CREATININE 0.74 09/11/2020   BUN 12 09/11/2020   NA 140 09/11/2020   K 4.5 09/11/2020   CL 100 09/11/2020   CO2 24 09/11/2020   Lab Results  Component Value Date   ALT 33 (H) 09/11/2020   AST 20 09/11/2020   ALKPHOS 122 (H) 09/11/2020   BILITOT 0.2 09/11/2020   Lab Results  Component Value Date   HGBA1C 6.7%  03/19/2020   HGBA1C 7.8 05/31/2019   HGBA1C 7.8 05/31/2019   HGBA1C 8.0 07/17/2018   HGBA1C 8.5 (H) 04/17/2018   Lab Results  Component Value Date   INSULIN 28.7 (H) 09/11/2020   Lab Results  Component Value Date   TSH 0.396 (L) 09/11/2020   Lab Results  Component Value Date   CHOL 237 (H) 09/11/2020   HDL 43 09/11/2020    LDLCALC 147 (H) 09/11/2020   TRIG 255 (H) 09/11/2020   Lab Results  Component Value Date   VD25OH 51.0 09/11/2020   VD25OH 45.9 06/20/2019   VD25OH 43.1 04/14/2017   Lab Results  Component Value Date   WBC 7.0 07/13/2020   HGB 13.4 07/13/2020   HCT 41.8 07/13/2020   MCV 85.7 07/13/2020   PLT 251 07/13/2020   Lab Results  Component Value Date   IRON 57 12/20/2018   TIBC 388 12/20/2018   FERRITIN 122 12/20/2018    Attestation Statements:   Reviewed by clinician on day of visit: allergies, medications, problem list, medical history, surgical history, family history, social history, and previous encounter notes.  Coral Ceo, CMA, am acting as Location manager for [employer], DO.  I have reviewed the above documentation for accuracy and completeness, and I agree with the above. Jearld Lesch, DO

## 2021-02-05 NOTE — Telephone Encounter (Signed)
Requested Prescriptions  Pending Prescriptions Disp Refills  . meloxicam (MOBIC) 15 MG tablet [Pharmacy Med Name: MELOXICAM 15 MG TABLET] 30 tablet 0    Sig: TAKE 1 TABLET (15 MG TOTAL) BY MOUTH DAILY.     Analgesics:  COX2 Inhibitors Passed - 02/05/2021  3:39 AM      Passed - HGB in normal range and within 360 days    Hemoglobin  Date Value Ref Range Status  07/13/2020 13.4 12.0 - 15.0 g/dL Final  06/20/2019 13.6 11.1 - 15.9 g/dL Final         Passed - Cr in normal range and within 360 days    Creatinine, Ser  Date Value Ref Range Status  09/11/2020 0.74 0.57 - 1.00 mg/dL Final         Passed - Patient is not pregnant      Passed - Valid encounter within last 12 months    Recent Outpatient Visits          3 weeks ago Acute pain of left shoulder   [place], [place] T, NP   2 months ago Gastroesophageal reflux disease without esophagitis   [facility] [place], [place] T, NP   3 months ago Type 2 diabetes mellitus with morbid obesity ([place])   [place], Jolene T, NP   9 months ago Type 2 diabetes mellitus with morbid obesity ([place])   [place], Jolene T, NP   11 months ago Type 2 diabetes mellitus with morbid obesity ([place])   [place], Barbaraann Faster, NP      Future Appointments            In 1 week Nash Dimmer, PsyD [place]   In 6 months [place], Gordan Payment [place]

## 2021-02-10 ENCOUNTER — Ambulatory Visit (INDEPENDENT_AMBULATORY_CARE_PROVIDER_SITE_OTHER): Payer: 59 | Admitting: Psychology

## 2021-02-10 ENCOUNTER — Telehealth: Payer: Self-pay

## 2021-02-10 DIAGNOSIS — F3189 Other bipolar disorder: Secondary | ICD-10-CM | POA: Diagnosis not present

## 2021-02-10 NOTE — Telephone Encounter (Signed)
Pt notified of procedure results. 

## 2021-02-10 NOTE — Telephone Encounter (Signed)
-----   Message from [name], MD sent at 02/10/2021  7:57 AM EDT ----- Let the patient know that the biopsies of the stomach showed healing inflammation.  The esophagus biopsies also showed some inflammation but no infection.  The inflammation esophagus is likely caused by reflux.  There is no sign of any other cause for the abnormal taste that you have in your mouth.

## 2021-02-17 ENCOUNTER — Telehealth (INDEPENDENT_AMBULATORY_CARE_PROVIDER_SITE_OTHER): Payer: 59 | Admitting: Psychology

## 2021-02-17 DIAGNOSIS — F3189 Other bipolar disorder: Secondary | ICD-10-CM | POA: Diagnosis not present

## 2021-02-18 ENCOUNTER — Ambulatory Visit
Admission: RE | Admit: 2021-02-18 | Discharge: 2021-02-18 | Disposition: A | Discharge status: Home / Self Care | Payer: 59 | Source: Ambulatory Visit | Attending: Family Medicine | Admitting: Family Medicine

## 2021-02-18 ENCOUNTER — Ambulatory Visit (INDEPENDENT_AMBULATORY_CARE_PROVIDER_SITE_OTHER)
Admission: RE | Admit: 2021-02-18 | Discharge: 2021-02-18 | Disposition: A | Discharge status: Home / Self Care | Payer: 59 | Source: Ambulatory Visit | Attending: Family Medicine | Admitting: Family Medicine

## 2021-02-18 ENCOUNTER — Other Ambulatory Visit: Payer: Self-pay

## 2021-02-18 VITALS — BP 149/111 | HR 86 | Temp 98.6°F | Resp 18 | Ht 67.0 in | Wt 281.1 lb

## 2021-02-18 DIAGNOSIS — R109 Unspecified abdominal pain: Secondary | ICD-10-CM

## 2021-02-18 DIAGNOSIS — R1031 Right lower quadrant pain: Secondary | ICD-10-CM

## 2021-02-18 LAB — URINALYSIS, COMPLETE (UACMP) WITH MICROSCOPIC
Bilirubin Urine: NEGATIVE
Glucose, UA: NEGATIVE mg/dL
Hgb urine dipstick: NEGATIVE
Ketones, ur: NEGATIVE mg/dL
Leukocytes,Ua: NEGATIVE
Nitrite: NEGATIVE
Protein, ur: NEGATIVE mg/dL
Specific Gravity, Urine: <1.005 — ABNORMAL LOW (ref 1.005–1.030)
pH: 6 (ref 5.0–8.0)

## 2021-02-18 NOTE — ED Provider Notes (Signed)
[facility]    CSN: [id number] Arrival date & time: 02/18/21  1341      History   Chief Complaint Chief Complaint  Patient presents with   Abdominal Pain    HPI 50 year old female presents the above complaints.  Patient reports that she has had intermittent, sharp and brief right lower quadrant pain for the past several months.  It has been occurring more frequently yesterday and today.  This prompted her to come in for evaluation.  Patient also reports that she now has some right-sided low back pain as well.  Her pain is currently mild, 2/10 in severity.  Occurs with certain movements and activities.  Resolves within a few minutes.  No nausea or vomiting.  No fever.  No changes in appetite.  No known relieving factors.  Past Medical History:  Diagnosis Date   Adult hypothyroidism 06/21/2012   Anxiety    Back pain    Benign paroxysmal positional nystagmus    Bilateral polycystic ovarian syndrome 06/21/2012   Biliary calculus with cholecystitis 08/14/2012   Bipolar 1 disorder (HCC)    BP (high blood pressure) 02/14/2014   Bronchitis    Constipation    COVID-19 07/2020   Depression    Diabetes mellitus ([place]) 08/14/2012   Diabetes mellitus without complication ([place])    pre-diabetic   Dysrhythmia    wore heart monitor 2016. Corrected by changing Levothyroxine dose.   Edema of both lower extremities    Gallbladder problem    GERD (gastroesophageal reflux disease)    Heart murmur    followed by PCP-AS A CHILD-ASYMPTOMATIC   Hyperlipidemia    Hyperprolactinemia ([place]) 06/21/2012   Hypertension    Hypothyroid    Hypoxia 04/17/2018   Joint pain    Kidney problem    Meningioma (HCC)    Motion sickness    carnival rides   Neuropathy    PONV (postoperative nausea and vomiting)    Low BP after sinus surgery. WAKES UP CRYING   Shortness of breath dyspnea    stairs. related to wt.   Sleep apnea    has CPAP. has not used since 2011   Swallowing difficulty     Vertigo 2016   none recently   Vitamin D deficiency     Patient Active Problem List   Diagnosis Date Noted   Gastritis without bleeding    Acute pain of left shoulder 01/09/2021   Aortic atherosclerosis ([place]) 09/30/2020   Type 2 diabetes mellitus with proteinuria ([place]) 02/20/2020   Meningioma ([place]) 12/24/2019   Hyperlipidemia associated with type 2 diabetes mellitus ([place]) 08/13/2019   Gastroesophageal reflux disease without esophagitis 06/20/2019   Cough in adult patient 12/26/2017   Type 2 diabetes mellitus with morbid obesity ([place]) 12/01/2015   OSA on CPAP 09/22/2015   Insomnia 08/28/2015   Internal hemorrhoids 08/28/2015   Vitamin D deficiency 08/28/2015   Obesity, Class III, BMI 40-49.9 (morbid obesity) ([place]) 08/27/2015   Hypertension associated with diabetes ([place]) 02/14/2014   Benign paroxysmal positional nystagmus 02/14/2014   Benign neoplasm of kidney 08/14/2012   Bipolar I disorder, single manic episode ([place]) 08/14/2012   Adult hypothyroidism 06/21/2012   Bilateral polycystic ovarian syndrome 06/21/2012    Past Surgical History:  Procedure Laterality Date   ABDOMINAL HYSTERECTOMY  2012   cervical dysplasia/ovaries remian   CHOLECYSTECTOMY     COLONOSCOPY WITH PROPOFOL N/A 06/19/2015   Procedure: COLONOSCOPY WITH PROPOFOL;  Surgeon: [name], MD;  Location: [place];  Service:  Endoscopy;  Laterality: N/A;  Diabetic - oral meds    DENTAL SURGERY     ESOPHAGOGASTRODUODENOSCOPY (EGD) WITH PROPOFOL N/A 01/16/2021   Procedure: ESOPHAGOGASTRODUODENOSCOPY (EGD) WITH PROPOFOL;  Surgeon: [name], MD;  Location: [place];  Service: Endoscopy;  Laterality: N/A;  Diabetic - oral meds   ETHMOIDECTOMY Bilateral 04/17/2018   Procedure: ETHMOIDECTOMY;  Surgeon: [name], MD;  Location: [facility];  Service: ENT;  Laterality: Bilateral;   GALLBLADDER SURGERY     IMAGE GUIDED SINUS SURGERY N/A 04/17/2018   Procedure: IMAGE GUIDED SINUS SURGERY;  Surgeon:  [name], MD;  Location: [facility];  Service: ENT;  Laterality: N/A;   MAXILLARY ANTROSTOMY Bilateral 04/17/2018   Procedure: MAXILLARY ANTROSTOMY;  Surgeon: [name], MD;  Location: [facility];  Service: ENT;  Laterality: Bilateral;   NASAL SEPTOPLASTY W/ TURBINOPLASTY Bilateral 04/17/2018   Procedure: NASAL SEPTOPLASTY WITH TURBINATE REDUCTION;  Surgeon: [name], MD;  Location: [facility];  Service: ENT;  Laterality: Bilateral;   TRANSURETHRAL RESECTION OF BLADDER TUMOR N/A 04/17/2018   Procedure: NTRANSURETHRAL RESECTION OF BLADDER TUMOR (TURBT) WITH GEMCITABINE;  Surgeon: [name], MD;  Location: [facility];  Service: Urology;  Laterality: N/A;    OB History     Gravida  1   Para  1   Term      Preterm      AB      Living         SAB      IAB      Ectopic      Multiple      Live Births               Home Medications    Prior to Admission medications   Medication Sig Start Date End Date Taking? Authorizing Provider  albuterol (VENTOLIN HFA) 108 (90 Base) MCG/ACT inhaler Inhale 2 puffs into the lungs every 4 (four) hours as needed for wheezing or shortness of breath. 08/11/20  Yes [provider]  amLODipine (NORVASC) 5 MG tablet TAKE 1 TABLET BY MOUTH EVERY DAY 12/09/20  Yes Cannady, Jolene T, NP  Ascorbic Acid (VITAMIN C) 1000 MG tablet Take 1,000 mg by mouth daily.   Yes [provider]  atorvastatin (LIPITOR) 20 MG tablet Take 20 mg by mouth daily.   Yes [provider]  Cholecalciferol 5000 units capsule Take 1 capsule (5,000 Units total) by mouth daily. 11/28/15  Yes Plonk, Gwyndolyn Saxon, MD  lamoTRIgine (LAMICTAL) 150 MG tablet Take 300 mg by mouth daily. 07/03/20  Yes [provider]  levothyroxine (SYNTHROID, LEVOTHROID) 175 MCG tablet TAKE 1 TABLET (175 MCG TOTAL) BY MOUTH DAILY. 09/05/17  Yes [name], MD  losartan (COZAAR) 100 MG tablet TAKE 1/2 TABLET (50 MG TOTAL) BY MOUTH 2 (TWO) TIMES DAILY. 12/16/20   Yes Cannady, Jolene T, NP  metFORMIN (GLUCOPHAGE) 500 MG tablet Take 1 tablet (500 mg total) by mouth 3 (three) times daily. 07/10/18  Yes [name], MD  Omega-3 Fatty Acids (FISH OIL) 1200 MG CAPS Take 1,200 mg by mouth daily.    Yes [provider]  omeprazole (PRILOSEC) 40 MG capsule Take 1 capsule (40 mg total) by mouth daily. 04/23/20  Yes Cannady, Jolene T, NP  ondansetron (ZOFRAN ODT) 4 MG disintegrating tablet Take 1 tablet (4 mg total) by mouth every 8 (eight) hours as needed for nausea or vomiting. 07/09/19  Yes Lamptey, Myrene Galas, MD  topiramate (TOPAMAX) 25 MG tablet Take 1 tablet (  25 mg total) by mouth 2 (two) times daily. 01/29/21  Yes Jearld Lesch A, DO  acetaminophen (TYLENOL) 500 MG tablet Take 500 mg by mouth every 6 (six) hours as needed.    [provider]  CONTOUR NEXT TEST test strip USE TO CHECK BS UP TO 3 TIMES DAILY FOR DIABETES DX: E11.9 06/13/19   [name], MD  Dulaglutide 1.5 MG/0.5ML SOPN Inject 1.5 mg into the skin once a week. 08/28/18   [provider]    Family History Family History  Problem Relation Age of Onset   Diabetes Maternal Grandmother    Hypertension Maternal Grandmother    Hyperlipidemia Maternal Grandmother    Hypothyroidism Maternal Grandmother    Heart disease Maternal Grandmother    Diabetes Mother    Hypertension Mother    Hypothyroidism Mother    Bipolar disorder Mother    Heart disease Mother    Kidney disease Mother    Thyroid disease Mother    Depression Mother    Anxiety disorder Mother    Drug abuse Mother    Obesity Mother    Alcohol abuse Father    Depression Daughter    Breast cancer Neg Hx     Social History Social History   Tobacco Use   Smoking status: Former    Packs/day: 1.00    Years: 10.00    Pack years: 10.00    Types: Cigarettes    Quit date: 07/12/2002    Years since quitting: 18.6   Smokeless tobacco: Never   Tobacco comments:    quit 2004  Vaping Use   Vaping  Use: Never used  Substance Use Topics   Alcohol use: Yes    Alcohol/week: 0.0 standard drinks    Comment: rarely   Drug use: No     Allergies   Hctz [hydrochlorothiazide], Lisinopril, Latex, and Prednisone   Review of Systems Review of Systems Per HPI  Physical Exam Triage Vital Signs ED Triage Vitals  Enc Vitals Group     BP 02/18/21 1414 (!) 149/111     Pulse Rate 02/18/21 1414 86     Resp 02/18/21 1414 18     Temp 02/18/21 1414 98.6 F (37 C)     Temp Source 02/18/21 1414 Oral     SpO2 02/18/21 1414 98 %     Weight 02/18/21 1412 281 lb 1.4 oz (127.5 kg)     Height 02/18/21 1412 '5\' 7"'$  (1.702 m)     Head Circumference --      Peak Flow --      Pain Score 02/18/21 1412 2     Pain Loc --      Pain Edu? --      Excl. in [place]? --    No data found.  Updated Vital Signs BP (!) 149/111 (BP Location: Right Arm)   Pulse 86   Temp 98.6 F (37 C) (Oral)   Resp 18   Ht '5\' 7"'$  (1.702 m)   Wt 127.5 kg   SpO2 98%   BMI 44.02 kg/m   Visual Acuity Right Eye Distance:   Left Eye Distance:   Bilateral Distance:    Right Eye Near:   Left Eye Near:    Bilateral Near:     Physical Exam Vitals and nursing note reviewed.  Constitutional:      General: She is not in acute distress.    Appearance: She is not ill-appearing.  HENT:     Head: Normocephalic and  atraumatic.  Cardiovascular:     Rate and Rhythm: Normal rate and regular rhythm.  Pulmonary:     Effort: Pulmonary effort is normal.     Breath sounds: Normal breath sounds. No wheezing, rhonchi or rales.  Abdominal:     General: There is no distension.     Palpations: Abdomen is soft.     Tenderness: There is right CVA tenderness.     Comments: Tenderness to palpation in the right lower quadrant.  Neurological:     Mental Status: She is alert.     UC Treatments / Results  Labs (all labs ordered are listed, but only abnormal results are displayed) Labs Reviewed  URINALYSIS, COMPLETE (UACMP) WITH  MICROSCOPIC - Abnormal; Notable for the following components:      Result Value   Specific Gravity, Urine <1.005 (*)    Bacteria, UA FEW (*)    All other components within normal limits    EKG   Radiology CT ABDOMEN PELVIS WO CONTRAST  Result Date: 02/18/2021 CLINICAL DATA:  Short right lower quadrant and right back pain upon standing. EXAM: CT ABDOMEN AND PELVIS WITHOUT CONTRAST TECHNIQUE: Multidetector CT imaging of the abdomen and pelvis was performed following the standard protocol without IV contrast. COMPARISON:  CT March 17, 2018 FINDINGS: Lower chest: No acute abnormality. Hepatobiliary: Unremarkable noncontrast appearance of the hepatic parenchyma. Gallbladder surgically absent. No biliary ductal dilation. Pancreas: Unremarkable noncontrast appearance of the pancreatic parenchyma. No pancreatic ductal dilation. Spleen: Within normal limits. Adrenals/Urinary Tract: Bilateral adrenal glands are unremarkable. Similar size of the angiomyolipoma in the right lower pole measuring 3.8 cm on image 39/2 previously 3.7 cm. Unchanged size of the tiny 6 mm angiomyolipoma in the left kidney on image 41/2. No hydronephrosis. No renal, ureteral or bladder calculi visualized. Urinary bladder is unremarkable for degree of distension. Stomach/Bowel: Stomach is distended with ingested contents without focal wall thickening. No pathologic dilation of small bowel. The appendix and terminal ileum appear normal. Scattered colonic diverticulosis without findings of acute diverticulitis. Small volume of formed stool in the colon. Vascular/Lymphatic: Scattered aortic atherosclerosis without abdominal aortic aneurysm. Similar appearance of the prominent/borderline enlarged retroperitoneal, pelvic sidewall and external iliac lymph nodes for instance a 9 mm left external iliac lymph node on image 76/2 previously measuring 10 mm, which given nearly 3 years of stability are favored to represent a benign process and are  likely reactive. Reproductive: Status post hysterectomy. No adnexal masses. Other: No abdominopelvic ascites. Musculoskeletal: Mild multilevel degenerative changes spine. No acute osseous abnormality. IMPRESSION: 1. No acute findings in the abdomen or pelvis. Normal appendix. 2. Scattered colonic diverticulosis without findings of acute diverticulitis. 3. Similar size of bilateral renal angiomyolipomas. 4. Aortic Atherosclerosis (ICD10-I70.0). Electronically Signed   By: Dahlia Bailiff MD   On: 02/18/2021 15:56    Procedures Procedures (including critical care time)  Medications Ordered in UC Medications - No data to display  Initial Impression / Assessment and Plan / UC Course  I have reviewed the triage vital signs and the nursing notes.  Pertinent labs & imaging results that were available during my care of the patient were reviewed by me and considered in my medical decision making (see chart for details).    50 year old female presents with right-sided low back pain and right lower quadrant pain.  Urinalysis unremarkable.  Given history and tenderness on exam, CT abdomen pelvis was obtained to assess for possible kidney stone or diverticulitis.  CT revealed diverticulosis.  No evidence of kidney  stone.  There were some benign renal angiomyolipomas.  These are being followed by urology.  Normal appendix.  No acute findings.  I believe that the patient is experiencing abdominal wall pain.  I advised her that this is muscular in nature.  Advised supportive care and her home meloxicam.  Follow-up with PCP.  Final Clinical Impressions(s) / UC Diagnoses   Final diagnoses:  Abdominal wall pain     Discharge Instructions      Try to avoid triggering activities/movements as much as you can.  Use the meloxicam as needed.   Follow up with your PCP.  Take care  Dr. Lacinda Axon    ED Prescriptions   None    PDMP not reviewed this encounter.   Coral Spikes, [place] 02/18/21 1614

## 2021-02-18 NOTE — Discharge Instructions (Addendum)
Try to avoid triggering activities/movements as much as you can.  Use the meloxicam as needed.   Follow up with your PCP.  Take care  Dr. Lacinda Axon

## 2021-02-18 NOTE — ED Notes (Signed)
Prior Auth approved for CT. Auth # [id number] Valid from 02/18/21- 03/19/21

## 2021-02-18 NOTE — ED Triage Notes (Signed)
Pt states over the last couple of months when she stands up she has a sharp pain on her RLQ and right lower back. She states it lasted for 2-3 minutes today and yesterday.

## 2021-02-19 ENCOUNTER — Encounter: Payer: Self-pay | Admitting: Nurse Practitioner

## 2021-02-19 ENCOUNTER — Ambulatory Visit (INDEPENDENT_AMBULATORY_CARE_PROVIDER_SITE_OTHER): Payer: 59 | Admitting: Nurse Practitioner

## 2021-02-19 VITALS — BP 148/83 | HR 81 | Temp 98.8°F | Wt 282.2 lb

## 2021-02-19 DIAGNOSIS — R109 Unspecified abdominal pain: Secondary | ICD-10-CM | POA: Diagnosis not present

## 2021-02-19 NOTE — Progress Notes (Signed)
Established Patient Office Visit  Subjective:  Patient ID: Lori Knight, female    DOB: May 17, 1971  Age: 50 y.o. MRN: [id number]  CC:  Chief Complaint  Patient presents with   Abdominal Injury    Patient states Tuesday she was sitting at home and she got up and felt a tear on the right side of her abdominal wall, patient states it happened every time she went from sitting to standing until she went to the ER, where they performed a CT scan and patient states while standing there it happened again the feeling of tearing. Patient state wanted to follow up as she was not happy with prior provider. Patient states it has not happened today.     HPI Lori Knight presents for right lower quadrant abdominal wall pain. This started on Tuesday after she got out of a chair. The pain was sharp, felt like something was tearing, and made her fall back into the chair. She has been experiencing intermittent sharp pains off and on randomly throughout the day. It can happen when she is sitting, standing, or changing positions. She states there is no rhyme or reason for the pain. Denies radiation and has taken some tylenol OTC for pain. She went to Urgent Care and had a CT scan which was negative.   Past Medical History:  Diagnosis Date   Adult hypothyroidism 06/21/2012   Anxiety    Back pain    Benign paroxysmal positional nystagmus    Bilateral polycystic ovarian syndrome 06/21/2012   Biliary calculus with cholecystitis 08/14/2012   Bipolar 1 disorder (HCC)    BP (high blood pressure) 02/14/2014   Bronchitis    Constipation    COVID-19 07/2020   Depression    Diabetes mellitus ([place]) 08/14/2012   Diabetes mellitus without complication ([place])    pre-diabetic   Dysrhythmia    wore heart monitor 2016. Corrected by changing Levothyroxine dose.   Edema of both lower extremities    Gallbladder problem    GERD (gastroesophageal reflux disease)    Heart murmur    followed by PCP-AS A  CHILD-ASYMPTOMATIC   Hyperlipidemia    Hyperprolactinemia ([employer]) 06/21/2012   Hypertension    Hypothyroid    Hypoxia 04/17/2018   Joint pain    Kidney problem    Meningioma (HCC)    Motion sickness    carnival rides   Neuropathy    PONV (postoperative nausea and vomiting)    Low BP after sinus surgery. WAKES UP CRYING   Shortness of breath dyspnea    stairs. related to wt.   Sleep apnea    has CPAP. has not used since 2011   Swallowing difficulty    Vertigo 2016   none recently   Vitamin D deficiency     Past Surgical History:  Procedure Laterality Date   ABDOMINAL HYSTERECTOMY  2012   cervical dysplasia/ovaries remian   CHOLECYSTECTOMY     COLONOSCOPY WITH PROPOFOL N/A 06/19/2015   Procedure: COLONOSCOPY WITH PROPOFOL;  Surgeon: [name], MD;  Location: [place];  Service: Endoscopy;  Laterality: N/A;  Diabetic - oral meds    DENTAL SURGERY     ESOPHAGOGASTRODUODENOSCOPY (EGD) WITH PROPOFOL N/A 01/16/2021   Procedure: ESOPHAGOGASTRODUODENOSCOPY (EGD) WITH PROPOFOL;  Surgeon: [name], MD;  Location: [place];  Service: Endoscopy;  Laterality: N/A;  Diabetic - oral meds   ETHMOIDECTOMY Bilateral 04/17/2018   Procedure: ETHMOIDECTOMY;  Surgeon: [name], MD;  Location: [facility];  Service: ENT;  Laterality: Bilateral;   GALLBLADDER SURGERY     IMAGE GUIDED SINUS SURGERY N/A 04/17/2018   Procedure: IMAGE GUIDED SINUS SURGERY;  Surgeon: [name], MD;  Location: [facility];  Service: ENT;  Laterality: N/A;   MAXILLARY ANTROSTOMY Bilateral 04/17/2018   Procedure: MAXILLARY ANTROSTOMY;  Surgeon: [name], MD;  Location: [facility];  Service: ENT;  Laterality: Bilateral;   NASAL SEPTOPLASTY W/ TURBINOPLASTY Bilateral 04/17/2018   Procedure: NASAL SEPTOPLASTY WITH TURBINATE REDUCTION;  Surgeon: [name], MD;  Location: [facility];  Service: ENT;  Laterality: Bilateral;   TRANSURETHRAL RESECTION OF BLADDER TUMOR N/A 04/17/2018   Procedure:  NTRANSURETHRAL RESECTION OF BLADDER TUMOR (TURBT) WITH GEMCITABINE;  Surgeon: [name], MD;  Location: [facility];  Service: Urology;  Laterality: N/A;    Family History  Problem Relation Age of Onset   Diabetes Maternal Grandmother    Hypertension Maternal Grandmother    Hyperlipidemia Maternal Grandmother    Hypothyroidism Maternal Grandmother    Heart disease Maternal Grandmother    Diabetes Mother    Hypertension Mother    Hypothyroidism Mother    Bipolar disorder Mother    Heart disease Mother    Kidney disease Mother    Thyroid disease Mother    Depression Mother    Anxiety disorder Mother    Drug abuse Mother    Obesity Mother    Alcohol abuse Father    Depression Daughter    Breast cancer Neg Hx     Social History   Socioeconomic History   Marital status: Single    Spouse name: [name]   Number of children: Not on file   Years of education: Not on file   Highest education level: Not on file  Occupational History   Occupation: Real Conservation officer, historic buildings  Tobacco Use   Smoking status: Former    Packs/day: 1.00    Years: 10.00    Pack years: 10.00    Types: Cigarettes    Quit date: 07/12/2002    Years since quitting: 18.6   Smokeless tobacco: Never   Tobacco comments:    quit 2004  Vaping Use   Vaping Use: Never used  Substance and Sexual Activity   Alcohol use: Yes    Alcohol/week: 0.0 standard drinks    Comment: rarely   Drug use: No   Sexual activity: Yes  Other Topics Concern   Not on file  Social History Narrative   Not on file   Social Determinants of Health   Financial Resource Strain: Not on file  Food Insecurity: Not on file  Transportation Needs: Not on file  Physical Activity: Not on file  Stress: Not on file  Social Connections: Not on file  Intimate Partner Violence: Not on file    Outpatient Medications Prior to Visit  Medication Sig Dispense Refill   acetaminophen (TYLENOL) 500 MG tablet Take 500 mg by mouth every 6 (six)  hours as needed.     albuterol (VENTOLIN HFA) 108 (90 Base) MCG/ACT inhaler Inhale 2 puffs into the lungs every 4 (four) hours as needed for wheezing or shortness of breath. 1 each 1   amLODipine (NORVASC) 5 MG tablet TAKE 1 TABLET BY MOUTH EVERY DAY 90 tablet 1   Ascorbic Acid (VITAMIN C) 1000 MG tablet Take 1,000 mg by mouth daily.     atorvastatin (LIPITOR) 20 MG tablet Take 20 mg by mouth daily.     Cholecalciferol 5000 units capsule Take 1 capsule (5,000 Units total) by mouth daily.  CONTOUR NEXT TEST test strip USE TO CHECK BS UP TO 3 TIMES DAILY FOR DIABETES DX: E11.9 100 strip 12   Dulaglutide 1.5 MG/0.5ML SOPN Inject 1.5 mg into the skin once a week.     lamoTRIgine (LAMICTAL) 150 MG tablet Take 300 mg by mouth daily.     levothyroxine (SYNTHROID, LEVOTHROID) 175 MCG tablet TAKE 1 TABLET (175 MCG TOTAL) BY MOUTH DAILY. 30 tablet 12   losartan (COZAAR) 100 MG tablet TAKE 1/2 TABLET (50 MG TOTAL) BY MOUTH 2 (TWO) TIMES DAILY. 90 tablet 1   metFORMIN (GLUCOPHAGE) 500 MG tablet Take 1 tablet (500 mg total) by mouth 3 (three) times daily. 270 tablet 0   mometasone (ELOCON) 0.1 % cream SMARTSIG:1 Application Topical     Omega-3 Fatty Acids (FISH OIL) 1200 MG CAPS Take 1,200 mg by mouth daily.      omeprazole (PRILOSEC) 40 MG capsule Take 1 capsule (40 mg total) by mouth daily. 90 capsule 4   ondansetron (ZOFRAN ODT) 4 MG disintegrating tablet Take 1 tablet (4 mg total) by mouth every 8 (eight) hours as needed for nausea or vomiting. 20 tablet 0   topiramate (TOPAMAX) 25 MG tablet Take 1 tablet (25 mg total) by mouth 2 (two) times daily. 60 tablet 0   No facility-administered medications prior to visit.    Allergies  Allergen Reactions   Hctz [Hydrochlorothiazide] Cough   Lisinopril Cough   Latex Rash    Condoms only   Prednisone Anxiety    Paranoia    ROS Review of Systems  Constitutional: Negative.   Respiratory: Negative.    Cardiovascular: Negative.   Gastrointestinal:   Positive for abdominal pain (RLQ).  Genitourinary: Negative.   Musculoskeletal:  Positive for back pain.  Skin: Negative.      Objective:    Physical Exam Vitals and nursing note reviewed.  Constitutional:      General: She is not in acute distress.    Appearance: Normal appearance.  HENT:     Head: Normocephalic and atraumatic.  Eyes:     Conjunctiva/sclera: Conjunctivae normal.  Neck:     Vascular: No carotid bruit.  Cardiovascular:     Rate and Rhythm: Normal rate and regular rhythm.     Pulses: Normal pulses.     Heart sounds: Normal heart sounds.  Pulmonary:     Effort: Pulmonary effort is normal.     Breath sounds: Normal breath sounds.  Abdominal:     Palpations: Abdomen is soft.     Tenderness: There is no abdominal tenderness.  Musculoskeletal:        General: Normal range of motion.     Cervical back: Normal range of motion.     Comments: Sharp pain to RLQ with lateral bending to the right of lumbar spine.   Skin:    General: Skin is warm and dry.  Neurological:     General: No focal deficit present.     Mental Status: She is alert and oriented to person, place, and time.  Psychiatric:        Mood and Affect: Mood normal.        Behavior: Behavior normal.    BP (!) 148/83   Pulse 81   Temp 98.8 F (37.1 C) (Oral)   Wt 282 lb 3.2 oz (128 kg)   SpO2 98%   BMI 44.20 kg/m  Wt Readings from Last 3 Encounters:  02/19/21 282 lb 3.2 oz (128 kg)  02/18/21 281 lb 1.4 oz (  127.5 kg)  01/29/21 281 lb (127.5 kg)     Health Maintenance Due  Topic Date Due   COVID-19 Vaccine (1) Never done   Zoster Vaccines- Shingrix (1 of 2) Never done   MAMMOGRAM  06/25/2020   INFLUENZA VACCINE  02/09/2021    There are no preventive care reminders to display for this patient.  Lab Results  Component Value Date   TSH 0.396 (L) 09/11/2020   Lab Results  Component Value Date   WBC 7.0 07/13/2020   HGB 13.4 07/13/2020   HCT 41.8 07/13/2020   MCV 85.7 07/13/2020    PLT 251 07/13/2020   Lab Results  Component Value Date   NA 140 09/11/2020   K 4.5 09/11/2020   CO2 24 09/11/2020   GLUCOSE 122 (H) 09/11/2020   BUN 12 09/11/2020   CREATININE 0.74 09/11/2020   BILITOT 0.2 09/11/2020   ALKPHOS 122 (H) 09/11/2020   AST 20 09/11/2020   ALT 33 (H) 09/11/2020   PROT 7.1 09/11/2020   ALBUMIN 4.2 09/11/2020   CALCIUM 9.5 09/11/2020   ANIONGAP 10 12/20/2018   EGFR 98 09/11/2020   Lab Results  Component Value Date   CHOL 237 (H) 09/11/2020   Lab Results  Component Value Date   HDL 43 09/11/2020   Lab Results  Component Value Date   LDLCALC 147 (H) 09/11/2020   Lab Results  Component Value Date   TRIG 255 (H) 09/11/2020   No results found for: [facility] Lab Results  Component Value Date   HGBA1C 6.7% 03/19/2020      Assessment & Plan:   Problem List Items Addressed This Visit   None Visit Diagnoses     Abdominal wall pain    -  Primary   Most likely muscle strain. Continue meloxicam daily and tylenol prn. Can use heat to abdomen for pain. Stretches given. F/U if symptoms don't improve       No orders of the defined types were placed in this encounter.   Follow-up: Return if symptoms worsen or fail to improve.    Charyl Dancer, NP

## 2021-02-24 NOTE — Progress Notes (Unsigned)
  Office: [phone]  /  Fax: [phone]    Date: March 10, 2021   Appointment Start Time: *** Duration: *** minutes Provider: Glennie Isle, Psy.D. Type of Session: Individual Therapy  Location of Patient: {gbptloc:23249} Location of Provider: Provider's Home (private office) Type of Contact: Telepsychological Visit via {MyChart Video Visit  Session Content: Lori Knight is a 50 y.o. female presenting for a follow-up appointment to address the previously established treatment goal of increasing coping skills. Today's appointment was a telepsychological visit due to COVID-19. Lori Knight provided verbal consent for today's telepsychological appointment and she is aware she is responsible for securing confidentiality on her end of the session. Prior to proceeding with today's appointment, Lori Knight's physical location at the time of this appointment was obtained as well a phone number she could be reached at in the event of technical difficulties. Lori Knight and this provider participated in today's telepsychological service.   This provider conducted a brief check-in. *** Lori Knight was receptive to today's appointment as evidenced by openness to sharing, responsiveness to feedback, and {gbreceptiveness:23401}.  Mental Status Examination:  Appearance: {Appearance:22431} Behavior: {Behavior:22445} Mood: {gbmood:21757} Affect: {Affect:22436} Speech: {Speech:22432} Eye Contact: {Eye Contact:22433} Psychomotor Activity: {Motor Activity:22434} Gait: {gbgait:23404} Thought Process: {thought process:22448}  Thought Content/Perception: {disturbances:22451} Orientation: {Orientation:22437} Memory/Concentration: {gbcognition:22449} Insight/Judgment: {Insight:22446}  Interventions:  {Interventions for Progress Notes:23405}  DSM-5 Diagnosis(es): F31.89 Other Specified Bipolar and Related Disorder, Emotional Eating Behaviors   Treatment Goal & Progress: During the initial appointment with this provider, the  following treatment goal was established: increase coping skills. Lori Knight has demonstrated progress in her goal as evidenced by {gbtxprogress:22839}. Lori Knight also {gbtxprogress2:22951}.  Plan: The next appointment will be scheduled in {gbweeks:21758}, which will be {gbtxmodality:23402}. The next session will focus on {Plan for Next Appointment:23400}.

## 2021-02-26 ENCOUNTER — Ambulatory Visit (INDEPENDENT_AMBULATORY_CARE_PROVIDER_SITE_OTHER): Payer: 59 | Admitting: Bariatrics

## 2021-02-27 ENCOUNTER — Encounter (INDEPENDENT_AMBULATORY_CARE_PROVIDER_SITE_OTHER): Payer: Self-pay | Admitting: Bariatrics

## 2021-03-01 ENCOUNTER — Other Ambulatory Visit (INDEPENDENT_AMBULATORY_CARE_PROVIDER_SITE_OTHER): Payer: Self-pay | Admitting: Bariatrics

## 2021-03-01 DIAGNOSIS — F5089 Other specified eating disorder: Secondary | ICD-10-CM

## 2021-03-02 NOTE — Telephone Encounter (Signed)
LAST APPOINTMENT DATE: 01/29/21 NEXT APPOINTMENT DATE: 03/04/21   [id number] [place] [address] [address]Phone: [phone]: [phone] Patient is requesting a refill of the following medications: Pending Prescriptions:                       Disp   Refills   topiramate (TOPAMAX) 25 MG tablet [Pharmac*60 tab*0       Sig: TAKE 1 TABLET BY MOUTH TWICE A DAY   Date last filled: 01/29/21 Previously prescribed by Dr. BOwens Shark Lab Results      Component                Value               Date                      HGBA1C                   6.7%                03/19/2020                HGBA1C                   7.8                 05/31/2019                HGBA1C                   7.8                 05/31/2019           Lab Results      Component                Value               Date                      MICROALBUR               80 (H)              02/20/2020                LDLCALC                  147 (H)             09/11/2020                CREATININE               0.74                09/11/2020           Lab Results      Component                Value               Date                      VD25OH                   51.0  09/11/2020                VD25OH                   45.9                06/20/2019                VD25OH                   43.1                04/14/2017            BP Readings from Last 3 Encounters: 02/19/21 : (!) 148/83 02/18/21 : (!) 149/111 01/29/21 : 135/87

## 2021-03-02 NOTE — Telephone Encounter (Signed)
Pt last seen by Dr. Brown.  

## 2021-03-03 ENCOUNTER — Encounter (INDEPENDENT_AMBULATORY_CARE_PROVIDER_SITE_OTHER): Payer: Self-pay

## 2021-03-04 ENCOUNTER — Encounter (INDEPENDENT_AMBULATORY_CARE_PROVIDER_SITE_OTHER): Payer: Self-pay | Admitting: Bariatrics

## 2021-03-04 ENCOUNTER — Other Ambulatory Visit: Payer: Self-pay

## 2021-03-04 ENCOUNTER — Ambulatory Visit (INDEPENDENT_AMBULATORY_CARE_PROVIDER_SITE_OTHER): Payer: 59 | Admitting: Bariatrics

## 2021-03-04 VITALS — BP 127/78 | HR 75 | Temp 98.0°F | Ht 67.0 in | Wt 278.0 lb

## 2021-03-04 DIAGNOSIS — F5089 Other specified eating disorder: Secondary | ICD-10-CM

## 2021-03-04 DIAGNOSIS — Z9189 Other specified personal risk factors, not elsewhere classified: Secondary | ICD-10-CM

## 2021-03-04 DIAGNOSIS — E669 Obesity, unspecified: Secondary | ICD-10-CM | POA: Diagnosis not present

## 2021-03-04 DIAGNOSIS — E1169 Type 2 diabetes mellitus with other specified complication: Secondary | ICD-10-CM

## 2021-03-04 DIAGNOSIS — E785 Hyperlipidemia, unspecified: Secondary | ICD-10-CM

## 2021-03-04 DIAGNOSIS — Z6841 Body Mass Index (BMI) 40.0 and over, adult: Secondary | ICD-10-CM

## 2021-03-04 MED ORDER — TOPIRAMATE 25 MG PO TABS: 25.0000 mg | ORAL_TABLET | Freq: Two times a day (BID) | ORAL | 0 refills | Status: DC

## 2021-03-05 ENCOUNTER — Ambulatory Visit (INDEPENDENT_AMBULATORY_CARE_PROVIDER_SITE_OTHER): Payer: 59 | Admitting: Psychology

## 2021-03-05 ENCOUNTER — Encounter (INDEPENDENT_AMBULATORY_CARE_PROVIDER_SITE_OTHER): Payer: Self-pay | Admitting: Bariatrics

## 2021-03-05 DIAGNOSIS — E559 Vitamin D deficiency, unspecified: Secondary | ICD-10-CM

## 2021-03-05 DIAGNOSIS — F3189 Other bipolar disorder: Secondary | ICD-10-CM

## 2021-03-05 DIAGNOSIS — E039 Hypothyroidism, unspecified: Secondary | ICD-10-CM

## 2021-03-05 DIAGNOSIS — E1169 Type 2 diabetes mellitus with other specified complication: Secondary | ICD-10-CM

## 2021-03-05 NOTE — Progress Notes (Signed)
Chief Complaint:   OBESITY Lori Knight is here to discuss her progress with her obesity treatment plan along with follow-up of her obesity related diagnoses. Lori Knight is on the Category 3 Plan and states she is following her eating plan approximately 20% of the time. Lori Knight states she is doing the treadmill for 30 minutes 2 times per week.  Today's visit was #: 8 Starting weight: 289 lbs Starting date: 09/11/2020 Today's weight: 278 lbs Today's date: 03/04/2021 Total lbs lost to date: 11 lbs Total lbs lost since last in-office visit: 3 lbs  Interim History: Lori Knight is down an additional 3 lbs and doing well overall.  Subjective:   1. Type 2 diabetes mellitus with obesity ([place]) Lori Knight is currently taking Dulaglutide and Metformin. She has an appointment with endocrinologist and schedule labs in [place]. She will bring in labs.   2. Hyperlipidemia associated with type 2 diabetes mellitus ([place]) Lori Knight is currently taking fish oil and Metformin.  3. Other disorder of eating Lori Knight has an appointment with Dr. Mallie Mussel. She has decreased emotional eating.  4. At risk for activity intolerance Lori Knight at risk for activity intolerance due to weather, released from physical therapy and obesity.  Assessment/Plan:   1. Type 2 diabetes mellitus with obesity ([place]) Lori Knight will continue medications. She will continue to decrease carbohydrates and she will increase healthy fats and proteins. [name], neuropathy, limb loss, blindness, coronary artery disease, and death. Intensive lifestyle modification including diet, exercise and weight loss are the first line of treatment for diabetes.   2. Hyperlipidemia associated with type 2 diabetes mellitus ([place]) Cardiovascular risk and specific lipid/LDL goals reviewed.  We discussed several lifestyle modifications today and Lori Knight will continue to work on diet, exercise  and weight loss efforts. Lori Knight will continue medications. Orders and follow up as documented in patient record.   Counseling Intensive lifestyle modifications are the first line treatment for this issue. Dietary changes: Increase soluble fiber. Decrease simple carbohydrates. Exercise changes: Moderate to vigorous-intensity aerobic activity 150 minutes per week if tolerated. Lipid-lowering medications: see documented in medical record.   3. Other disorder of eating Lori Knight will follow up with her regular therapist now. We will refill Topamax 25 1 tablet by mouth twice daily for 1 month with no refills.  - topiramate (TOPAMAX) 25 MG tablet; Take 1 tablet (25 mg total) by mouth 2 (two) times daily.  Dispense: 60 tablet; Refill: 0  4. At risk for activity intolerance Lori Knight was given approximately 15 minutes of exercise intolerance counseling today. She is 50 y.o. female and has risk factors exercise intolerance including obesity. We discussed intensive lifestyle modifications today with an emphasis on specific weight loss instructions and strategies. Lori Knight will slowly increase activity as tolerated.  Repetitive spaced learning was employed today to elicit superior memory formation and behavioral change.   5. Obesity, current BMI of 43.6 Lori Knight is currently in the action stage of change. As such, her goal is to continue with weight loss efforts. She has agreed to the Category 3 Plan.   Lori Knight will continue meal planning. She will continue intentional eating.  Exercise goals:  Lori Knight will walk 6,000 steps and treadmill.   Behavioral modification strategies: increasing lean protein intake, decreasing simple carbohydrates, increasing vegetables, increasing water intake, decreasing eating out, no skipping meals, meal planning and cooking strategies, keeping healthy foods in the home, and planning for success.  Lori Knight has agreed  to follow-up with our clinic in 4 weeks. She was informed of the importance  of frequent follow-up visits to maximize her success with intensive lifestyle modifications for her multiple health conditions.   Objective:   Blood pressure 127/78, pulse 75, temperature 98 F (36.7 C), height '5\' 7"'$  (1.702 m), weight 278 lb (126.1 kg), SpO2 96 %. Body mass index is 43.54 kg/m.  General: Cooperative, alert, well developed, in no acute distress. HEENT: Conjunctivae and lids unremarkable. Cardiovascular: Regular rhythm.  Lungs: Normal work of breathing. Neurologic: No focal deficits.   Lab Results  Component Value Date   CREATININE 0.74 09/11/2020   BUN 12 09/11/2020   NA 140 09/11/2020   K 4.5 09/11/2020   CL 100 09/11/2020   CO2 24 09/11/2020   Lab Results  Component Value Date   ALT 33 (H) 09/11/2020   AST 20 09/11/2020   ALKPHOS 122 (H) 09/11/2020   BILITOT 0.2 09/11/2020   Lab Results  Component Value Date   HGBA1C 6.7% 03/19/2020   HGBA1C 7.8 05/31/2019   HGBA1C 7.8 05/31/2019   HGBA1C 8.0 07/17/2018   HGBA1C 8.5 (H) 04/17/2018   Lab Results  Component Value Date   INSULIN 28.7 (H) 09/11/2020   Lab Results  Component Value Date   TSH 0.396 (L) 09/11/2020   Lab Results  Component Value Date   CHOL 237 (H) 09/11/2020   HDL 43 09/11/2020   LDLCALC 147 (H) 09/11/2020   TRIG 255 (H) 09/11/2020   Lab Results  Component Value Date   VD25OH 51.0 09/11/2020   VD25OH 45.9 06/20/2019   VD25OH 43.1 04/14/2017   Lab Results  Component Value Date   WBC 7.0 07/13/2020   HGB 13.4 07/13/2020   HCT 41.8 07/13/2020   MCV 85.7 07/13/2020   PLT 251 07/13/2020   Lab Results  Component Value Date   IRON 57 12/20/2018   TIBC 388 12/20/2018   FERRITIN 122 12/20/2018   Attestation Statements:   Reviewed by clinician on day of visit: allergies, medications, problem list, medical history, surgical history, family history, social history, and previous encounter notes.  I, Lizbeth Bark, RMA, am acting as Location manager for [employer], DO.   I  have reviewed the above documentation for accuracy and completeness, and I agree with the above. Jearld Lesch, DO

## 2021-03-06 ENCOUNTER — Encounter (INDEPENDENT_AMBULATORY_CARE_PROVIDER_SITE_OTHER): Payer: Self-pay | Admitting: Bariatrics

## 2021-03-09 ENCOUNTER — Other Ambulatory Visit: Payer: 59

## 2021-03-09 NOTE — Telephone Encounter (Signed)
FYI

## 2021-03-10 ENCOUNTER — Telehealth (INDEPENDENT_AMBULATORY_CARE_PROVIDER_SITE_OTHER): Payer: 59 | Admitting: Psychology

## 2021-03-12 ENCOUNTER — Other Ambulatory Visit: Payer: Self-pay

## 2021-03-12 ENCOUNTER — Other Ambulatory Visit: Payer: 59

## 2021-03-12 DIAGNOSIS — E039 Hypothyroidism, unspecified: Secondary | ICD-10-CM

## 2021-03-12 DIAGNOSIS — E1169 Type 2 diabetes mellitus with other specified complication: Secondary | ICD-10-CM

## 2021-03-12 DIAGNOSIS — E559 Vitamin D deficiency, unspecified: Secondary | ICD-10-CM

## 2021-03-13 ENCOUNTER — Encounter: Payer: Self-pay | Admitting: Nurse Practitioner

## 2021-03-13 ENCOUNTER — Ambulatory Visit (INDEPENDENT_AMBULATORY_CARE_PROVIDER_SITE_OTHER): Payer: 59 | Admitting: Nurse Practitioner

## 2021-03-13 DIAGNOSIS — Z9989 Dependence on other enabling machines and devices: Secondary | ICD-10-CM

## 2021-03-13 DIAGNOSIS — E1159 Type 2 diabetes mellitus with other circulatory complications: Secondary | ICD-10-CM | POA: Diagnosis not present

## 2021-03-13 DIAGNOSIS — D329 Benign neoplasm of meninges, unspecified: Secondary | ICD-10-CM

## 2021-03-13 DIAGNOSIS — E1169 Type 2 diabetes mellitus with other specified complication: Secondary | ICD-10-CM

## 2021-03-13 DIAGNOSIS — G4733 Obstructive sleep apnea (adult) (pediatric): Secondary | ICD-10-CM

## 2021-03-13 DIAGNOSIS — F309 Manic episode, unspecified: Secondary | ICD-10-CM | POA: Diagnosis not present

## 2021-03-13 DIAGNOSIS — E66813 Obesity, class 3: Secondary | ICD-10-CM

## 2021-03-13 DIAGNOSIS — I152 Hypertension secondary to endocrine disorders: Secondary | ICD-10-CM

## 2021-03-13 DIAGNOSIS — E039 Hypothyroidism, unspecified: Secondary | ICD-10-CM

## 2021-03-13 DIAGNOSIS — E785 Hyperlipidemia, unspecified: Secondary | ICD-10-CM

## 2021-03-13 MED ORDER — AMLODIPINE BESYLATE 5 MG PO TABS
5.0000 mg | ORAL_TABLET | Freq: Every day | ORAL | 4 refills | Status: DC
Start: 2021-03-13 — End: 2021-07-16

## 2021-03-13 MED ORDER — LOSARTAN POTASSIUM 50 MG PO TABS: 100.0000 mg | ORAL_TABLET | Freq: Every day | ORAL | 4 refills | Status: DC

## 2021-03-13 NOTE — Assessment & Plan Note (Signed)
Chronic, ongoing.  Continue on statin, recently started and is benefiting LDL, discussed with her.  Lipid panel up to date.  Return in 3 months.

## 2021-03-13 NOTE — Assessment & Plan Note (Signed)
Chronic, stable, managed by endocrinology.  At this time level on low side on labs today -- discussed with patient and she has been now taking medication as she should be, which she had not in past.  Recommend we reduce medication to 175 MCG daily, no doubles on weekends.  We will then recheck levels next visit.  Educated her on this.

## 2021-03-13 NOTE — Assessment & Plan Note (Signed)
Chronic, ongoing with recent A1C 7%.  Urine ALB up to date on review endo records.  Continue Losartan for kidney protection.  Continue current medication regimen as prescribed by endocrinology and adjust as needed.  Educated on BS goals in morning and 2 hours after meals.  Return in 3 months.  May trial alternate endocrinologist in upcoming months if patient requests.

## 2021-03-13 NOTE — Assessment & Plan Note (Signed)
Followed by neurology, continue collaboration with neurology team and recent notes reviewed. 

## 2021-03-13 NOTE — Assessment & Plan Note (Signed)
Chronic, ongoing with BP at goal today in office.  Continue current medication regimen and adjust as needed.  Focus on DASH diet and regular exercise at home.  Recommend she check BP at few days a week at home and document for provider.  Continue to collaborate with weight management.  Return in 3 months.

## 2021-03-13 NOTE — Assessment & Plan Note (Signed)
Continue collaboration with weight management in [place], which she finds value with.  Recommended eating smaller high protein, low fat meals more frequently and exercising 30 mins a day 5 times a week with a goal of 10-15lb weight loss in the next 3 months. Patient voiced their understanding and motivation to adhere to these recommendations.

## 2021-03-13 NOTE — Progress Notes (Addendum)
BP 129/86   Pulse 70   Temp 98.8 F (37.1 C) (Oral)   Wt 281 lb (127.5 kg)   SpO2 97%   BMI 44.01 kg/m    Subjective:    Patient ID: Lori Knight, female    DOB: 1970-11-07, 50 y.o.   MRN: [id number]  HPI: Lori Knight is a 50 y.o. female  Chief Complaint  Patient presents with  . Referral    Patient states her Endocrinologist has stopped practicing and patient states she is not happy with current staff and would like to discuss treatment options.     DIABETES Followed by endocrinology and last saw 02/03/21 with her A1c 7% on labs 03/12/21, was 6.3% in July. Continues on Metformin 500 MG TID (intolerant to higher dosing), Trulicity 1.5 MG weekly. Her current endo provider, NP Pasty Arch, she reports is no longer with practice and she has not been satisfied with current provider, but wishes to stay and see if new provider comes in before transferring.     Her goal is weight loss, she is currently working with [facility] in [place]. Hypoglycemic episodes:no Polydipsia/polyuria: occasional Visual disturbance: no Chest pain: no Paresthesias: no Glucose Monitoring: no             Accucheck frequency: not regularly             Fasting glucose: did not check this morning             Post prandial:             Evening:             Before meals: Taking Insulin?: no             Long acting insulin:             Short acting insulin: Blood Pressure Monitoring: not checking Retinal Examination: Up to Date Foot Exam: Up to Date Diabetic Education: Completed Pneumovax: Up to Date Influenza: Up to Date Aspirin: no    HYPOTHYROIDISM Followed by endocrinology and continues on Levothyroxine 175 MCG 6 days per week, takes 2 tablets on Sunday.  TSH on 03/13/21 was 0.162 Thyroid control status:stable Satisfied with current treatment? yes Medication side effects: no Medication compliance: good compliance Etiology of hypothyroidism:  Recent dose adjustment:no Fatigue:  no Cold intolerance: no Heat intolerance: no Weight gain: no Weight loss: no Constipation: no Diarrhea/loose stools: no Palpitations: no Lower extremity edema: no Anxiety/depressed mood: no   HYPERTENSION / HYPERLIPIDEMIA Continues on Losartan 100 MG, Amlodipine 5 MG and Atorvastatin 20 MG QHS.  Has CPAP using 7 nights a week, not using consistently.  Her last echo in 2019 noted EF 50-55%.    Saw neurosurgery, Dr. Lacinda Axon, for follow-up meningioma which remains stable.   Duration of hypertension: chronic BP monitoring frequency: not checking BP range:  BP medication side effects: no Duration of hyperlipidemia: chronic Aspirin: no Recent stressors: no Recurrent headaches: no Visual changes: no Palpitations: no Dyspnea: no Chest pain: no Lower extremity edema: no Dizzy/lightheaded: no  The 10-year ASCVD risk score Mikey Bussing DC Jr., et al., 2013) is: 5.9%   Values used to calculate the score:     Age: 40 years     Sex: Female     Is Non-Hispanic African American: No     Diabetic: Yes     Tobacco smoker: No     Systolic Blood Pressure: 480 mmHg     Is BP treated: Yes     HDL Cholesterol:  43 mg/dL     Total Cholesterol: 237 mg/dL   Relevant past medical, surgical, family and social history reviewed and updated as indicated. Interim medical history since our last visit reviewed. Allergies and medications reviewed and updated.  Review of Systems  Constitutional:  Negative for activity change, appetite change, diaphoresis, fatigue and fever.  Respiratory:  Negative for cough, chest tightness and shortness of breath.   Cardiovascular:  Negative for chest pain, palpitations and leg swelling.  Gastrointestinal: Negative.   Endocrine: Negative for cold intolerance, heat intolerance, polydipsia, polyphagia and polyuria.  Neurological: Negative.   Psychiatric/Behavioral: Negative.     Per HPI unless specifically indicated above     Objective:    BP 129/86   Pulse 70   Temp 98.8  F (37.1 C) (Oral)   Wt 281 lb (127.5 kg)   SpO2 97%   BMI 44.01 kg/m   Wt Readings from Last 3 Encounters:  03/13/21 281 lb (127.5 kg)  03/04/21 278 lb (126.1 kg)  02/19/21 282 lb 3.2 oz (128 kg)    Physical Exam Vitals and nursing note reviewed.  Constitutional:      General: She is awake. She is not in acute distress.    Appearance: She is well-developed and well-groomed. She is morbidly obese. She is not ill-appearing.  HENT:     Head: Normocephalic.     Right Ear: Hearing normal.     Left Ear: Hearing normal.  Eyes:     General: Lids are normal.        Right eye: No discharge.        Left eye: No discharge.     Conjunctiva/sclera: Conjunctivae normal.     Pupils: Pupils are equal, round, and reactive to light.  Neck:     Thyroid: No thyromegaly.     Vascular: No carotid bruit.  Cardiovascular:     Rate and Rhythm: Normal rate and regular rhythm.     Heart sounds: Normal heart sounds. No murmur heard.   No gallop.  Pulmonary:     Effort: Pulmonary effort is normal. No accessory muscle usage or respiratory distress.     Breath sounds: Normal breath sounds.  Abdominal:     General: Bowel sounds are normal.     Palpations: Abdomen is soft.  Musculoskeletal:     Cervical back: Normal range of motion and neck supple.     Right lower leg: No edema.     Left lower leg: No edema.  Lymphadenopathy:     Cervical: No cervical adenopathy.  Skin:    General: Skin is warm and dry.  Neurological:     Mental Status: She is alert and oriented to person, place, and time.  Psychiatric:        Attention and Perception: Attention normal.        Mood and Affect: Mood normal.        Speech: Speech normal.        Behavior: Behavior normal. Behavior is cooperative.        Thought Content: Thought content normal.    Results for orders placed or performed in visit on 03/12/21  Insulin, Free and Total  Result Value Ref Range   Free Insulin WILL FOLLOW    Total Insulin WILL FOLLOW    HgB A1c  Result Value Ref Range   Hgb A1c MFr Bld 7.0 (H) 4.8 - 5.6 %   Est. average glucose Bld gHb Est-mCnc 154 mg/dL  VITAMIN D 25 Hydroxy (Vit-D  Deficiency, Fractures)  Result Value Ref Range   Vit D, 25-Hydroxy 55.0 30.0 - 100.0 ng/mL  TSH  Result Value Ref Range   TSH 0.162 (L) 0.450 - 4.500 uIU/mL  Comp Met (CMET)  Result Value Ref Range   Glucose 143 (H) 65 - 99 mg/dL   BUN 10 6 - 24 mg/dL   Creatinine, Ser 0.83 0.57 - 1.00 mg/dL   eGFR 85 >59 mL/min/1.73   BUN/Creatinine Ratio 12 9 - 23   Sodium 143 134 - 144 mmol/L   Potassium 4.3 3.5 - 5.2 mmol/L   Chloride 105 96 - 106 mmol/L   CO2 23 20 - 29 mmol/L   Calcium 9.0 8.7 - 10.2 mg/dL   Total Protein 6.4 6.0 - 8.5 g/dL   Albumin 4.1 3.8 - 4.9 g/dL   Globulin, Total 2.3 1.5 - 4.5 g/dL   Albumin/Globulin Ratio 1.8 1.2 - 2.2   Bilirubin Total <0.2 0.0 - 1.2 mg/dL   Alkaline Phosphatase 137 (H) 44 - 121 IU/L   AST 17 0 - 40 IU/L   ALT 28 0 - 32 IU/L      Assessment & Plan:   Problem List Items Addressed This Visit       Cardiovascular and Mediastinum   Hypertension associated with diabetes ([place])    Chronic, ongoing with BP at goal today in office.  Continue current medication regimen and adjust as needed.  Focus on DASH diet and regular exercise at home.  Recommend she check BP at few days a week at home and document for provider.  Continue to collaborate with weight management.  Return in 3 months.      Relevant Medications   losartan (COZAAR) 50 MG tablet   amLODipine (NORVASC) 5 MG tablet     Respiratory   OSA on CPAP    Recommend 100% use of CPAP for overall health.        Endocrine   Adult hypothyroidism    Chronic, stable, managed by endocrinology.  At this time level on low side on labs today -- discussed with patient and she has been now taking medication as she should be, which she had not in past.  Recommend we reduce medication to 175 MCG daily, no doubles on weekends.  We will then recheck  levels next visit.  Educated her on this.        Type 2 diabetes mellitus with morbid obesity (HCC) - Primary    Chronic, ongoing with recent A1C 7%.  Urine ALB up to date on review endo records.  Continue Losartan for kidney protection.  Continue current medication regimen as prescribed by endocrinology and adjust as needed.  Educated on BS goals in morning and 2 hours after meals.  Return in 3 months.  May trial alternate endocrinologist in upcoming months if patient requests.      Relevant Medications   losartan (COZAAR) 50 MG tablet   Hyperlipidemia associated with type 2 diabetes mellitus (HCC)    Chronic, ongoing.  Continue on statin, recently started and is benefiting LDL, discussed with her.  Lipid panel up to date.  Return in 3 months.      Relevant Medications   losartan (COZAAR) 50 MG tablet     Nervous and Auditory   Meningioma ([place])    Followed by neurology, continue collaboration with neurology team and recent notes reviewed.        Other   Bipolar I disorder, single manic episode (HCC)    Chronic,  stable.  Denies SI/HI.  Followed by psychiatry, will continue this collaboration and current medication regimen as prescribed by them.  Return in 3 months.      Obesity, Class III, BMI 40-49.9 (morbid obesity) ([place])    Continue collaboration with weight management in [place], which she finds value with.  Recommended eating smaller high protein, low fat meals more frequently and exercising 30 mins a day 5 times a week with a goal of 10-15lb weight loss in the next 3 months. Patient voiced their understanding and motivation to adhere to these recommendations.         Follow up plan: Return in about 3 months (around 06/12/2021) for T2DM, HTN/HLD, MOOD, THYROID.

## 2021-03-13 NOTE — Assessment & Plan Note (Signed)
Recommend 100% use of CPAP for overall health.

## 2021-03-13 NOTE — Assessment & Plan Note (Signed)
Chronic, stable.  Denies SI/HI.  Followed by psychiatry, will continue this collaboration and current medication regimen as prescribed by them.  Return in 3 months. 

## 2021-03-13 NOTE — Patient Instructions (Signed)
Diabetes Mellitus and Nutrition, Adult When you have diabetes, or diabetes mellitus, it is very important to have healthy eating habits because your blood sugar (glucose) levels are greatly affected by what you eat and drink. Eating healthy foods in the right amounts, at about the same times every day, can help you:  Control your blood glucose.  Lower your risk of heart disease.  Improve your blood pressure.  Reach or maintain a healthy weight. What can affect my meal plan? Every person with diabetes is different, and each person has different needs for a meal plan. Your health care provider may recommend that you work with a dietitian to make a meal plan that is best for you. Your meal plan may vary depending on factors such as:  The calories you need.  The medicines you take.  Your weight.  Your blood glucose, blood pressure, and cholesterol levels.  Your activity level.  Other health conditions you have, such as heart or kidney disease. How do carbohydrates affect me? Carbohydrates, also called carbs, affect your blood glucose level more than any other type of food. Eating carbs naturally raises the amount of glucose in your blood. Carb counting is a method for keeping track of how many carbs you eat. Counting carbs is important to keep your blood glucose at a healthy level, especially if you use insulin or take certain oral diabetes medicines. It is important to know how many carbs you can safely have in each meal. This is different for every person. Your dietitian can help you calculate how many carbs you should have at each meal and for each snack. How does alcohol affect me? Alcohol can cause a sudden decrease in blood glucose (hypoglycemia), especially if you use insulin or take certain oral diabetes medicines. Hypoglycemia can be a life-threatening condition. Symptoms of hypoglycemia, such as sleepiness, dizziness, and confusion, are similar to symptoms of having too much  alcohol.  Do not drink alcohol if: ? Your health care provider tells you not to drink. ? You are pregnant, may be pregnant, or are planning to become pregnant.  If you drink alcohol: ? Do not drink on an empty stomach. ? Limit how much you use to:  0-1 drink a day for women.  0-2 drinks a day for men. ? Be aware of how much alcohol is in your drink. In the [place] one drink equals one 12 oz bottle of beer (355 mL), one 5 oz glass of wine (148 mL), or one 1 oz glass of hard liquor (44 mL). ? Keep yourself hydrated with water, diet soda, or unsweetened iced tea.  Keep in mind that regular soda, juice, and other mixers may contain a lot of sugar and must be counted as carbs. What are tips for following this plan? Reading food labels  Start by checking the serving size on the "Nutrition Facts" label of packaged foods and drinks. The amount of calories, carbs, fats, and other nutrients listed on the label is based on one serving of the item. Many items contain more than one serving per package.  Check the total grams (g) of carbs in one serving. You can calculate the number of servings of carbs in one serving by dividing the total carbs by 15. For example, if a food has 30 g of total carbs per serving, it would be equal to 2 servings of carbs.  Check the number of grams (g) of saturated fats and trans fats in one serving. Choose foods that have   a low amount or none of these fats.  Check the number of milligrams (mg) of salt (sodium) in one serving. Most people should limit total sodium intake to less than 2,300 mg per day.  Always check the nutrition information of foods labeled as "low-fat" or "nonfat." These foods may be higher in added sugar or refined carbs and should be avoided.  Talk to your dietitian to identify your daily goals for nutrients listed on the label. Shopping  Avoid buying canned, pre-made, or processed foods. These foods tend to be high in fat, sodium, and added  sugar.  Shop around the outside edge of the grocery store. This is where you will most often find fresh fruits and vegetables, bulk grains, fresh meats, and fresh dairy. Cooking  Use low-heat cooking methods, such as baking, instead of high-heat cooking methods like deep frying.  Cook using healthy oils, such as olive, canola, or sunflower oil.  Avoid cooking with butter, cream, or high-fat meats. Meal planning  Eat meals and snacks regularly, preferably at the same times every day. Avoid going long periods of time without eating.  Eat foods that are high in fiber, such as fresh fruits, vegetables, beans, and whole grains. Talk with your dietitian about how many servings of carbs you can eat at each meal.  Eat 4-6 oz (112-168 g) of lean protein each day, such as lean meat, chicken, fish, eggs, or tofu. One ounce (oz) of lean protein is equal to: ? 1 oz (28 g) of meat, chicken, or fish. ? 1 egg. ?  cup (62 g) of tofu.  Eat some foods each day that contain healthy fats, such as avocado, nuts, seeds, and fish.   What foods should I eat? Fruits Berries. Apples. Oranges. Peaches. Apricots. Plums. Grapes. Mango. Papaya. Pomegranate. Kiwi. Cherries. Vegetables Lettuce. Spinach. Leafy greens, including kale, chard, collard greens, and mustard greens. Beets. Cauliflower. Cabbage. Broccoli. Carrots. Green beans. Tomatoes. Peppers. Onions. Cucumbers. Brussels sprouts. Grains Whole grains, such as whole-wheat or whole-grain bread, crackers, tortillas, cereal, and pasta. Unsweetened oatmeal. Quinoa. Brown or wild rice. Meats and other proteins Seafood. Poultry without skin. Lean cuts of poultry and beef. Tofu. Nuts. Seeds. Dairy Low-fat or fat-free dairy products such as milk, yogurt, and cheese. The items listed above may not be a complete list of foods and beverages you can eat. Contact a dietitian for more information. What foods should I avoid? Fruits Fruits canned with  syrup. Vegetables Canned vegetables. Frozen vegetables with butter or cream sauce. Grains Refined white flour and flour products such as bread, pasta, snack foods, and cereals. Avoid all processed foods. Meats and other proteins Fatty cuts of meat. Poultry with skin. Breaded or fried meats. Processed meat. Avoid saturated fats. Dairy Full-fat yogurt, cheese, or milk. Beverages Sweetened drinks, such as soda or iced tea. The items listed above may not be a complete list of foods and beverages you should avoid. Contact a dietitian for more information. Questions to ask a health care provider  Do I need to meet with a diabetes educator?  Do I need to meet with a dietitian?  What number can I call if I have questions?  When are the best times to check my blood glucose? Where to find more information:  American Diabetes Association: diabetes.org  Academy of Nutrition and Dietetics: www.eatright.org  National Institute of Diabetes and Digestive and Kidney Diseases: www.niddk.nih.gov  Association of Diabetes Care and Education Specialists: www.diabeteseducator.org Summary  It is important to have healthy eating   habits because your blood sugar (glucose) levels are greatly affected by what you eat and drink.  A healthy meal plan will help you control your blood glucose and maintain a healthy lifestyle.  Your health care provider may recommend that you work with a dietitian to make a meal plan that is best for you.  Keep in mind that carbohydrates (carbs) and alcohol have immediate effects on your blood glucose levels. It is important to count carbs and to use alcohol carefully. This information is not intended to replace advice given to you by your health care provider. Make sure you discuss any questions you have with your health care provider. Document Revised: 06/05/2019 Document Reviewed: 06/05/2019 Elsevier Patient Education  2021 Elsevier Inc.  

## 2021-03-17 NOTE — Progress Notes (Signed)
  Office: [phone]  /  Fax: [phone]    Date: March 31, 2021   Appointment Start Time: 8:02am Duration: 32 minutes Provider: Glennie Isle, Psy.D. Type of Session: Individual Therapy  Location of Patient: Home (private room) Location of Provider: Provider's Home (private office) Type of Contact: Telepsychological Visit via MyChart Video Visit  Session Content: Jozi is a 50 y.o. female presenting for a follow-up appointment to address the previously established treatment goal of increasing coping skills.Today's appointment was a telepsychological visit due to COVID-19. [name] provided verbal consent for today's telepsychological appointment and she is aware she is responsible for securing confidentiality on her end of the session. Prior to proceeding with today's appointment, Armonii's physical location at the time of this appointment was obtained as well a phone number she could be reached at in the event of technical difficulties. Audrianne and this provider participated in today's telepsychological service.   This provider conducted a brief check-in. Maxi stated she has been focusing on work. She stated she is also "putting [herself] out there to make new friends." She added she is focusing on herself with her primary therapist. Regarding eating, Emmajean continues to describe a reduction in emotional eating behaviors. She acknowledged some deviations from her prescribed structured meal plan due to "lazy eating," noting it is in response to physical hunger. Jina further indicated she is trying to be consistent with journaling. She expressed concern about carb intake and acknowledged challenges with protein intake. Associated thoughts/feelings processed. Tacoya agreed to discuss further with Dr. Owens Shark.   Due to reported challenges with protein/carb intake and its impact on well-being and autopilot mode, psychoeducation provided regarding self-compassion. Jersei was engaged in a self-compassion  exercise to help with eating-related challenges and other ongoing stressors. She was encouraged to regularly ask herself, "What do I need right now?" Overall, Emie was receptive to today's appointment as evidenced by openness to sharing, responsiveness to feedback, and  willingness to focus on increasing self-compassion .  Mental Status Examination:  Appearance: well groomed and appropriate hygiene  Behavior: appropriate to circumstances Mood: euthymic Affect: mood congruent Speech: normal in rate, volume, and tone Eye Contact: appropriate Psychomotor Activity: appropriate Gait: unable to assess Thought Process: linear, logical, and goal directed  Thought Content/Perception: no hallucinations, delusions, bizarre thinking or behavior reported or observed and no evidence or endorsement of suicidal and homicidal ideation, plan, and intent Orientation: time, person, place, and purpose of appointment Memory/Concentration: memory, attention, language, and fund of knowledge intact  Insight/Judgment: fair  Interventions:  Conducted a brief chart review Provided empathic reflections and validation Provided positive reinforcement Employed supportive psychotherapy interventions to facilitate reduced distress and to improve coping skills with identified stressors Psychoeducation provided regarding self-compassion Engaged patient in self-compassion exercise  DSM-5 Diagnosis(es): F31.89 Other Specified Bipolar and Related Disorder, Emotional Eating Behaviors   Treatment Goal & Progress: During the initial appointment with this provider, the following treatment goal was established: increase coping skills. Vidushi has demonstrated progress in her goal as evidenced by increased awareness of hunger patterns, increased awareness of triggers for emotional eating behaviors, and reduction in emotional eating behaviors . Iyah also continues to demonstrate willingness to engage in learned skill(s).  Plan: The  next appointment will be scheduled in 3-4 weeks, which will be via MyChart Video Visit. The next session will focus on working towards the established treatment goal and termination. Deshea continues to meet with her primary therapist every two weeks.

## 2021-03-19 ENCOUNTER — Ambulatory Visit (INDEPENDENT_AMBULATORY_CARE_PROVIDER_SITE_OTHER): Payer: 59 | Admitting: Psychology

## 2021-03-19 DIAGNOSIS — F3189 Other bipolar disorder: Secondary | ICD-10-CM | POA: Diagnosis not present

## 2021-03-22 LAB — COMPREHENSIVE METABOLIC PANEL
ALT: 28 IU/L (ref 0–32)
AST: 17 IU/L (ref 0–40)
Albumin/Globulin Ratio: 1.8 (ref 1.2–2.2)
Albumin: 4.1 g/dL (ref 3.8–4.9)
Alkaline Phosphatase: 137 IU/L — ABNORMAL HIGH (ref 44–121)
BUN/Creatinine Ratio: 12 (ref 9–23)
BUN: 10 mg/dL (ref 6–24)
Bilirubin Total: <0.2 mg/dL (ref 0.0–1.2)
CO2: 23 mmol/L (ref 20–29)
Calcium: 9 mg/dL (ref 8.7–10.2)
Chloride: 105 mmol/L (ref 96–106)
Creatinine, Ser: 0.83 mg/dL (ref 0.57–1.00)
Globulin, Total: 2.3 g/dL (ref 1.5–4.5)
Glucose: 143 mg/dL — ABNORMAL HIGH (ref 65–99)
Potassium: 4.3 mmol/L (ref 3.5–5.2)
Sodium: 143 mmol/L (ref 134–144)
Total Protein: 6.4 g/dL (ref 6.0–8.5)
eGFR: 85 mL/min/{1.73_m2} (ref >59)

## 2021-03-22 LAB — INSULIN, FREE AND TOTAL
Free Insulin: 35 uU/mL — ABNORMAL HIGH
Total Insulin: 37 uU/mL

## 2021-03-22 LAB — TSH: TSH: 0.162 u[IU]/mL — ABNORMAL LOW (ref 0.450–4.500)

## 2021-03-22 LAB — HEMOGLOBIN A1C
Est. average glucose Bld gHb Est-mCnc: 154 mg/dL
Hgb A1c MFr Bld: 7 % — ABNORMAL HIGH (ref 4.8–5.6)

## 2021-03-22 LAB — VITAMIN D 25 HYDROXY (VIT D DEFICIENCY, FRACTURES): Vit D, 25-Hydroxy: 55 ng/mL (ref 30.0–100.0)

## 2021-03-24 ENCOUNTER — Telehealth (INDEPENDENT_AMBULATORY_CARE_PROVIDER_SITE_OTHER): Payer: 59 | Admitting: Psychology

## 2021-03-31 ENCOUNTER — Telehealth (INDEPENDENT_AMBULATORY_CARE_PROVIDER_SITE_OTHER): Payer: 59 | Admitting: Psychology

## 2021-03-31 DIAGNOSIS — F3189 Other bipolar disorder: Secondary | ICD-10-CM

## 2021-04-02 ENCOUNTER — Encounter (INDEPENDENT_AMBULATORY_CARE_PROVIDER_SITE_OTHER): Payer: Self-pay | Admitting: Bariatrics

## 2021-04-02 ENCOUNTER — Other Ambulatory Visit: Payer: Self-pay

## 2021-04-02 ENCOUNTER — Ambulatory Visit (INDEPENDENT_AMBULATORY_CARE_PROVIDER_SITE_OTHER): Payer: 59 | Admitting: Bariatrics

## 2021-04-02 VITALS — BP 126/80 | HR 84 | Temp 97.9°F | Ht 67.0 in | Wt 280.0 lb

## 2021-04-02 DIAGNOSIS — E669 Obesity, unspecified: Secondary | ICD-10-CM

## 2021-04-02 DIAGNOSIS — E038 Other specified hypothyroidism: Secondary | ICD-10-CM | POA: Diagnosis not present

## 2021-04-02 DIAGNOSIS — Z9189 Other specified personal risk factors, not elsewhere classified: Secondary | ICD-10-CM

## 2021-04-02 DIAGNOSIS — E039 Hypothyroidism, unspecified: Secondary | ICD-10-CM

## 2021-04-02 DIAGNOSIS — E1169 Type 2 diabetes mellitus with other specified complication: Secondary | ICD-10-CM | POA: Diagnosis not present

## 2021-04-02 DIAGNOSIS — Z6841 Body Mass Index (BMI) 40.0 and over, adult: Secondary | ICD-10-CM

## 2021-04-04 ENCOUNTER — Other Ambulatory Visit: Payer: Self-pay | Admitting: Nurse Practitioner

## 2021-04-04 MED ORDER — TRULICITY 3 MG/0.5ML ~~LOC~~ SOAJ: 3.0000 mg | SUBCUTANEOUS | 4 refills | Status: DC

## 2021-04-04 NOTE — Progress Notes (Signed)
Trulicity increase to 3 MG after discussion with Dr. Owens Shark, healthy weight and wellness provider to patient.

## 2021-04-06 ENCOUNTER — Encounter (INDEPENDENT_AMBULATORY_CARE_PROVIDER_SITE_OTHER): Payer: Self-pay | Admitting: Bariatrics

## 2021-04-06 NOTE — Progress Notes (Signed)
Chief Complaint:   OBESITY Lori Knight is here to discuss her progress with her obesity treatment plan along with follow-up of her obesity related diagnoses. Lori Knight is on the Category 3 Plan and states she is following her eating plan approximately 25% of the time. Lori Knight states she is walking on the treadmill for  20 minutes 2-3 times per week.  Today's visit was #: 9 Starting weight: 289 lbs Starting date: 09/11/2020 Today's weight: 280 lbs Today's date:04/02/2021 Total lbs lost to date: 9 lbs Total lbs lost since last in-office visit: 0  Interim History: Lori Knight is up 2 lbs since her last visit. She is doing well.  Subjective:   1. Other specified hypothyroidism Lori Knight's last TSH level had decreased.  2. Diabetes mellitus type 2 in obese [facility]) Lori Knight is currently taking Trulicity and Metformin. Her last A1C level was 7.0.  3. At risk for activity intolerance Lori Knight is at risk for activity intolerance due to hypothyroidism and obesity.  Assessment/Plan:   1. Other specified hypothyroidism Lori Knight's PCP made adjustments in her medications and we will recheck periodically. . Orders and follow up as documented in patient record.  Counseling Good thyroid control is important for overall health. Supratherapeutic thyroid levels are dangerous and will not improve weight loss results. Counseling: The correct way to take levothyroxine is fasting, with water, separated by at least 30 minutes from breakfast, and separated by more than 4 hours from calcium, iron, multivitamins, acid reflux medications (PPIs).    2. Diabetes mellitus type 2 in obese ([place] emailed her PCP about increasing her Trulicity for better control of diabetes and weight support. [name], neuropathy, limb loss, blindness, coronary artery disease, and death. Intensive lifestyle modification including diet, exercise and weight  loss are the first line of treatment for diabetes.    3. At risk for activity intolerance Lori Knight was given approximately 15 minutes of exercise intolerance counseling today. She is 50 y.o. female and has risk factors exercise intolerance including obesity. We discussed intensive lifestyle modifications today with an emphasis on specific weight loss instructions and strategies. Lori Knight will slowly increase activity as tolerated.  Repetitive spaced learning was employed today to elicit superior memory formation and behavioral change.   4. Obesity, current BMI of 43.9 Lori Knight is currently in the action stage of change. As such, her goal is to continue with weight loss efforts. She has agreed to the Category 3 Plan and keeping a food journal and adhering to recommended goals of 1500 calories and 90 grams of protein.   Lori Knight will continue meal planning and intentional eating. We will review labs from 03/12/2021.  Exercise goals:  As is.  Behavioral modification strategies: increasing lean protein intake, decreasing simple carbohydrates, increasing vegetables, increasing water intake, decreasing eating out, no skipping meals, meal planning and cooking strategies, keeping healthy foods in the home, and planning for success.  Lori Knight has agreed to follow-up with our clinic in 4 weeks. She was informed of the importance of frequent follow-up visits to maximize her success with intensive lifestyle modifications for her multiple health conditions.   Objective:   Blood pressure 126/80, pulse 84, temperature 97.9 F (36.6 C), height 5\' 7"  (1.702 m), weight 280 lb (127 kg), SpO2 96 %. Body mass index is 43.85 kg/m.  General: Cooperative, alert, well developed, in no acute distress. HEENT: Conjunctivae and lids unremarkable. Cardiovascular: Regular rhythm.  Lungs: Normal work of  breathing. Neurologic: No focal deficits.   Lab Results  Component Value Date   CREATININE 0.83 03/12/2021   BUN 10 03/12/2021    NA 143 03/12/2021   K 4.3 03/12/2021   CL 105 03/12/2021   CO2 23 03/12/2021   Lab Results  Component Value Date   ALT 28 03/12/2021   AST 17 03/12/2021   ALKPHOS 137 (H) 03/12/2021   BILITOT <0.2 03/12/2021   Lab Results  Component Value Date   HGBA1C 7.0 (H) 03/12/2021   HGBA1C 6.7% 03/19/2020   HGBA1C 7.8 05/31/2019   HGBA1C 7.8 05/31/2019   HGBA1C 8.0 07/17/2018   Lab Results  Component Value Date   INSULIN 28.7 (H) 09/11/2020   Lab Results  Component Value Date   TSH 0.162 (L) 03/12/2021   Lab Results  Component Value Date   CHOL 237 (H) 09/11/2020   HDL 43 09/11/2020   LDLCALC 147 (H) 09/11/2020   TRIG 255 (H) 09/11/2020   Lab Results  Component Value Date   VD25OH 55.0 03/12/2021   VD25OH 51.0 09/11/2020   VD25OH 45.9 06/20/2019   Lab Results  Component Value Date   WBC 7.0 07/13/2020   HGB 13.4 07/13/2020   HCT 41.8 07/13/2020   MCV 85.7 07/13/2020   PLT 251 07/13/2020   Lab Results  Component Value Date   IRON 57 12/20/2018   TIBC 388 12/20/2018   FERRITIN 122 12/20/2018   Attestation Statements:   Reviewed by clinician on day of visit: allergies, medications, problem list, medical history, surgical history, family history, social history, and previous encounter notes.  I, Lizbeth Bark, RMA, am acting as Location manager for [employer], DO.   I have reviewed the above documentation for accuracy and completeness, and I agree with the above. Jearld Lesch, DO

## 2021-04-08 ENCOUNTER — Ambulatory Visit: Payer: 59 | Admitting: Psychology

## 2021-04-14 NOTE — Progress Notes (Unsigned)
  Office: [phone]  /  Fax: [phone]    Date: April 28, 2021   Appointment Start Time: *** Duration: *** minutes Provider: Glennie Isle, Psy.D. Type of Session: Individual Therapy  Location of Patient: {gbptloc:23249} Location of Provider: Provider's Home (private office) Type of Contact: Telepsychological Visit via MyChart Video Visit  Session Content:This provider called Lori Knight at 8:02am as she did not present for today's appointment. A HIPAA compliant voicemail was left requesting a call back.   As such, today's appointment was initiated *** minutes late. Lori Knight is a 49 y.o. female presenting for a follow-up appointment to address the previously established treatment goal of increasing coping skills.Today's appointment was a telepsychological visit due to COVID-19. Lori Knight provided verbal consent for today's telepsychological appointment and she is aware she is responsible for securing confidentiality on her end of the session. Prior to proceeding with today's appointment, Lori Knight's physical location at the time of this appointment was obtained as well a phone number she could be reached at in the event of technical difficulties. Lori Knight and this provider participated in today's telepsychological service.   This provider conducted a brief check-in. *** Lori Knight was receptive to today's appointment as evidenced by openness to sharing, responsiveness to feedback, and {gbreceptiveness:23401}.  Mental Status Examination:  Appearance: {Appearance:22431} Behavior: {Behavior:22445} Mood: {gbmood:21757} Affect: {Affect:22436} Speech: {Speech:22432} Eye Contact: {Eye Contact:22433} Psychomotor Activity: {Motor Activity:22434} Gait: {gbgait:23404} Thought Process: {thought process:22448}  Thought Content/Perception: {disturbances:22451} Orientation: {Orientation:22437} Memory/Concentration: {gbcognition:22449} Insight/Judgment: {Insight:22446}  Interventions:  {Interventions for Progress  Notes:23405}  DSM-5 Diagnosis(es): F31.89 Other Specified Bipolar and Related Disorder, Emotional Eating Behaviors   Treatment Goal & Progress: During the initial appointment with this provider, the following treatment goal was established: increase coping skills. Lori Knight has demonstrated progress in her goal as evidenced by {gbtxprogress:22839}. Lori Knight also {gbtxprogress2:22951}.  Plan: The next appointment will be scheduled in {gbweeks:21758}, which will be {gbtxmodality:23402}. The next session will focus on {Plan for Next Appointment:23400}.

## 2021-04-16 ENCOUNTER — Encounter: Payer: Self-pay | Admitting: Nurse Practitioner

## 2021-04-16 ENCOUNTER — Ambulatory Visit (INDEPENDENT_AMBULATORY_CARE_PROVIDER_SITE_OTHER): Payer: 59 | Admitting: Nurse Practitioner

## 2021-04-16 ENCOUNTER — Other Ambulatory Visit: Payer: Self-pay

## 2021-04-16 VITALS — BP 138/94 | HR 82 | Temp 98.2°F | Ht 67.0 in | Wt 280.0 lb

## 2021-04-16 DIAGNOSIS — R059 Cough, unspecified: Secondary | ICD-10-CM | POA: Diagnosis not present

## 2021-04-16 DIAGNOSIS — E039 Hypothyroidism, unspecified: Secondary | ICD-10-CM | POA: Diagnosis not present

## 2021-04-16 DIAGNOSIS — R1031 Right lower quadrant pain: Secondary | ICD-10-CM | POA: Diagnosis not present

## 2021-04-16 DIAGNOSIS — J069 Acute upper respiratory infection, unspecified: Secondary | ICD-10-CM | POA: Insufficient documentation

## 2021-04-16 DIAGNOSIS — G8929 Other chronic pain: Secondary | ICD-10-CM | POA: Insufficient documentation

## 2021-04-16 MED ORDER — AMOXICILLIN-POT CLAVULANATE 875-125 MG PO TABS: 1.0000 | ORAL_TABLET | Freq: Two times a day (BID) | ORAL | 0 refills | Status: AC

## 2021-04-16 NOTE — Progress Notes (Signed)
Established Patient Office Visit  Subjective:  Patient ID: Lori Knight, female    DOB: 04-01-1971  Age: 50 y.o. MRN: [id number]  CC:  Chief Complaint  Patient presents with   Abdominal Pain    Ct scan follow up for abdominal pain.    Cough    Cough has be present for [redacted] weeks along with sore throat. Feels congested but not producing anything when she coughs. Not taking anything for cough.   Sore Throat    HPI Lori Knight presents for ongoing abdominal pain and URI.  ABDOMINAL PAIN (RLQ) Was first seen for this in urgent care 02/18/21 -- had CT scan which was unremarkable -- followed up in office for this on 02/19/21, diagnosed with muscle pull.  She reports discomfort is not all the time, but is ongoing.  She is concerned it is a tear or something inside.  Notices this discomfort at times 4-5 times a day, sometimes none.  Initially was tied to only movement, but now it is any time -- no focused trigger anymore.  Does follow with Dr. [name] for GI, but has not seen him for this.  Recent thyroid labs -- showed lower levels and dosing adjusted.  Recent A1c 7%.  Still has appendix. Duration:months Onset: gradual Severity: 3/10 Quality: sharp and aching -- lasts about 3-5 seconds and goes away Location:  RLQ  Episode duration:  Radiation: no Frequency: intermittent Alleviating factors:  Aggravating factors: Status: fluctuating Treatments attempted: Tylenol in morning Fever: no Nausea: no Vomiting: no Weight loss: no Decreased appetite: no Diarrhea:  occasional Constipation: no Blood in stool: no Heartburn: no Jaundice: no Rash: no Dysuria/urinary frequency: no Hematuria: no History of sexually transmitted disease: no Recurrent NSAID use: no   UPPER RESPIRATORY TRACT INFECTION Started over week ago with sore throat and cough + chest congestion.  Two Covid tests at home negative.  Now is more cough and remainder of symptoms improving. Worst symptom: Fever:  no Cough: yes Shortness of breath:  only with cough Wheezing: no Chest pain: no Chest tightness: yes Chest congestion: yes Nasal congestion: no Runny nose: no Post nasal drip: no Sneezing: no Sore throat: yes in the morning only Swollen glands: no Sinus pressure: no Headache: no Face pain: no Toothache: no Ear pain: none Ear pressure: none Eyes red/itching:no Eye drainage/crusting: no  Vomiting: no Rash: no Fatigue: yes Sick contacts: no Strep contacts: no  Context: fluctuating Recurrent sinusitis: no Relief with OTC cold/cough medications: no  Treatments attempted: none    Past Medical History:  Diagnosis Date   Adult hypothyroidism 06/21/2012   Anxiety    Back pain    Benign paroxysmal positional nystagmus    Bilateral polycystic ovarian syndrome 06/21/2012   Biliary calculus with cholecystitis 08/14/2012   Bipolar 1 disorder (HCC)    BP (high blood pressure) 02/14/2014   Bronchitis    Constipation    COVID-19 07/2020   Depression    Diabetes mellitus ([place]) 08/14/2012   Diabetes mellitus without complication ([place])    pre-diabetic   Dysrhythmia    wore heart monitor 2016. Corrected by changing Levothyroxine dose.   Edema of both lower extremities    Gallbladder problem    GERD (gastroesophageal reflux disease)    Heart murmur    followed by PCP-AS A CHILD-ASYMPTOMATIC   Hyperlipidemia    Hyperprolactinemia ([place]) 06/21/2012   Hypertension    Hypothyroid    Hypoxia 04/17/2018   Joint pain    Kidney problem  Meningioma ([place])    Motion sickness    carnival rides   Neuropathy    PONV (postoperative nausea and vomiting)    Low BP after sinus surgery. WAKES UP CRYING   Shortness of breath dyspnea    stairs. related to wt.   Sleep apnea    has CPAP. has not used since 2011   Swallowing difficulty    Vertigo 2016   none recently   Vitamin D deficiency     Past Surgical History:  Procedure Laterality Date   ABDOMINAL HYSTERECTOMY  2012    cervical dysplasia/ovaries remian   CHOLECYSTECTOMY     COLONOSCOPY WITH PROPOFOL N/A 06/19/2015   Procedure: COLONOSCOPY WITH PROPOFOL;  Surgeon: [name], MD;  Location: [place];  Service: Endoscopy;  Laterality: N/A;  Diabetic - oral meds    DENTAL SURGERY     ESOPHAGOGASTRODUODENOSCOPY (EGD) WITH PROPOFOL N/A 01/16/2021   Procedure: ESOPHAGOGASTRODUODENOSCOPY (EGD) WITH PROPOFOL;  Surgeon: [name], MD;  Location: [place];  Service: Endoscopy;  Laterality: N/A;  Diabetic - oral meds   ETHMOIDECTOMY Bilateral 04/17/2018   Procedure: ETHMOIDECTOMY;  Surgeon: [name], MD;  Location: [facility];  Service: ENT;  Laterality: Bilateral;   GALLBLADDER SURGERY     IMAGE GUIDED SINUS SURGERY N/A 04/17/2018   Procedure: IMAGE GUIDED SINUS SURGERY;  Surgeon: [name], MD;  Location: [facility];  Service: ENT;  Laterality: N/A;   MAXILLARY ANTROSTOMY Bilateral 04/17/2018   Procedure: MAXILLARY ANTROSTOMY;  Surgeon: [name], MD;  Location: [facility];  Service: ENT;  Laterality: Bilateral;   NASAL SEPTOPLASTY W/ TURBINOPLASTY Bilateral 04/17/2018   Procedure: NASAL SEPTOPLASTY WITH TURBINATE REDUCTION;  Surgeon: [name], MD;  Location: [facility];  Service: ENT;  Laterality: Bilateral;   TRANSURETHRAL RESECTION OF BLADDER TUMOR N/A 04/17/2018   Procedure: NTRANSURETHRAL RESECTION OF BLADDER TUMOR (TURBT) WITH GEMCITABINE;  Surgeon: [name], MD;  Location: [facility];  Service: Urology;  Laterality: N/A;    Family History  Problem Relation Age of Onset   Diabetes Maternal Grandmother    Hypertension Maternal Grandmother    Hyperlipidemia Maternal Grandmother    Hypothyroidism Maternal Grandmother    Heart disease Maternal Grandmother    Diabetes Mother    Hypertension Mother    Hypothyroidism Mother    Bipolar disorder Mother    Heart disease Mother    Kidney disease Mother    Thyroid disease Mother    Depression Mother    Anxiety disorder Mother     Drug abuse Mother    Obesity Mother    Alcohol abuse Father    Depression Daughter    Breast cancer Neg Hx     Social History   Socioeconomic History   Marital status: Single    Spouse name: [name]   Number of children: Not on file   Years of education: Not on file   Highest education level: Not on file  Occupational History   Occupation: Real Conservation officer, historic buildings  Tobacco Use   Smoking status: Former    Packs/day: 1.00    Years: 10.00    Pack years: 10.00    Types: Cigarettes    Quit date: 07/12/2002    Years since quitting: 18.7   Smokeless tobacco: Never   Tobacco comments:    quit 2004  Vaping Use   Vaping Use: Never used  Substance and Sexual Activity   Alcohol use: Yes    Alcohol/week: 0.0 standard drinks    Comment: rarely  Drug use: No   Sexual activity: Yes  Other Topics Concern   Not on file  Social History Narrative   Not on file   Social Determinants of Health   Financial Resource Strain: Not on file  Food Insecurity: Not on file  Transportation Needs: Not on file  Physical Activity: Not on file  Stress: Not on file  Social Connections: Not on file  Intimate Partner Violence: Not on file    Outpatient Medications Prior to Visit  Medication Sig Dispense Refill   acetaminophen (TYLENOL) 500 MG tablet Take 500 mg by mouth every 6 (six) hours as needed.     albuterol (VENTOLIN HFA) 108 (90 Base) MCG/ACT inhaler Inhale 2 puffs into the lungs every 4 (four) hours as needed for wheezing or shortness of breath. 1 each 1   amLODipine (NORVASC) 5 MG tablet Take 1 tablet (5 mg total) by mouth daily. 90 tablet 4   Ascorbic Acid (VITAMIN C) 1000 MG tablet Take 1,000 mg by mouth daily.     atorvastatin (LIPITOR) 20 MG tablet Take 20 mg by mouth daily.     Cholecalciferol 5000 units capsule Take 1 capsule (5,000 Units total) by mouth daily.     CONTOUR NEXT TEST test strip USE TO CHECK BS UP TO 3 TIMES DAILY FOR DIABETES DX: E11.9 100 strip 12   Dulaglutide  (TRULICITY) 3 NA/3.5TD SOPN Inject 3 mg as directed once a week. 6 mL 4   lamoTRIgine (LAMICTAL) 150 MG tablet Take 300 mg by mouth daily.     levothyroxine (SYNTHROID, LEVOTHROID) 175 MCG tablet TAKE 1 TABLET (175 MCG TOTAL) BY MOUTH DAILY. 30 tablet 12   losartan (COZAAR) 50 MG tablet Take 2 tablets (100 mg total) by mouth daily. 180 tablet 4   metFORMIN (GLUCOPHAGE) 500 MG tablet Take 1 tablet (500 mg total) by mouth 3 (three) times daily. 270 tablet 0   mometasone (ELOCON) 0.1 % cream SMARTSIG:1 Application Topical     Omega-3 Fatty Acids (FISH OIL) 1200 MG CAPS Take 1,200 mg by mouth daily.      omeprazole (PRILOSEC) 40 MG capsule Take 1 capsule (40 mg total) by mouth daily. 90 capsule 4   ondansetron (ZOFRAN ODT) 4 MG disintegrating tablet Take 1 tablet (4 mg total) by mouth every 8 (eight) hours as needed for nausea or vomiting. 20 tablet 0   topiramate (TOPAMAX) 25 MG tablet Take 1 tablet (25 mg total) by mouth 2 (two) times daily. 60 tablet 0   No facility-administered medications prior to visit.    Allergies  Allergen Reactions   Hctz [Hydrochlorothiazide] Cough   Lisinopril Cough   Latex Rash    Condoms only   Prednisone Anxiety    Paranoia    ROS Review of Systems  Constitutional:  Positive for fatigue. Negative for activity change, appetite change and fever.  HENT:  Negative for congestion, ear discharge, ear pain, postnasal drip, rhinorrhea, sinus pressure, sinus pain, sneezing, sore throat and voice change.   Respiratory:  Positive for cough, chest tightness and shortness of breath. Negative for wheezing.   Cardiovascular:  Negative for chest pain, palpitations and leg swelling.  Gastrointestinal:  Positive for abdominal pain. Negative for abdominal distention, constipation, diarrhea, nausea and vomiting.  Endocrine: Negative for cold intolerance and heat intolerance.  Neurological: Negative.   Psychiatric/Behavioral: Negative.       Objective:    Physical  Exam Vitals and nursing note reviewed.  Constitutional:      General: She  is awake. She is not in acute distress.    Appearance: She is well-developed and well-groomed. She is obese. She is not ill-appearing or toxic-appearing.  HENT:     Head: Normocephalic and atraumatic.     Right Ear: Hearing, tympanic membrane, ear canal and external ear normal.     Left Ear: Hearing, tympanic membrane, ear canal and external ear normal.     Nose: Nose normal.     Right Sinus: No maxillary sinus tenderness or frontal sinus tenderness.     Left Sinus: No maxillary sinus tenderness or frontal sinus tenderness.     Mouth/Throat:     Pharynx: Uvula midline. No oropharyngeal exudate.  Eyes:     General:        Right eye: No discharge.        Left eye: No discharge.     Conjunctiva/sclera: Conjunctivae normal.     Pupils: Pupils are equal, round, and reactive to light.  Neck:     Thyroid: No thyromegaly.     Vascular: No carotid bruit or JVD.  Cardiovascular:     Rate and Rhythm: Normal rate and regular rhythm.     Heart sounds: Normal heart sounds. No murmur heard.   No gallop.  Pulmonary:     Effort: Pulmonary effort is normal. No accessory muscle usage or respiratory distress.     Breath sounds: Normal breath sounds.  Abdominal:     General: Bowel sounds are normal. There is no distension.     Palpations: Abdomen is soft.     Tenderness: There is no abdominal tenderness. There is no right CVA tenderness, left CVA tenderness or rebound. Negative signs include psoas sign and obturator sign.     Hernia: No hernia is present.  Musculoskeletal:        General: Normal range of motion.     Cervical back: Normal range of motion and neck supple.  Lymphadenopathy:     Cervical: No cervical adenopathy.  Skin:    General: Skin is warm and dry.  Neurological:     Mental Status: She is alert and oriented to person, place, and time.     Deep Tendon Reflexes: Reflexes are normal and symmetric.   Psychiatric:        Attention and Perception: Attention normal.        Mood and Affect: Mood normal.        Speech: Speech normal.        Behavior: Behavior normal. Behavior is cooperative.        Thought Content: Thought content normal.    BP (!) 138/94   Pulse 82   Temp 98.2 F (36.8 C) (Oral)   Ht '5\' 7"'  (1.702 m)   Wt 280 lb (127 kg)   SpO2 96%   BMI 43.85 kg/m  Wt Readings from Last 3 Encounters:  04/16/21 280 lb (127 kg)  04/02/21 280 lb (127 kg)  03/13/21 281 lb (127.5 kg)     Health Maintenance Due  Topic Date Due   MAMMOGRAM  06/25/2020    There are no preventive care reminders to display for this patient.  Lab Results  Component Value Date   TSH 0.162 (L) 03/12/2021   Lab Results  Component Value Date   WBC 7.0 07/13/2020   HGB 13.4 07/13/2020   HCT 41.8 07/13/2020   MCV 85.7 07/13/2020   PLT 251 07/13/2020   Lab Results  Component Value Date   NA 143 03/12/2021   K  4.3 03/12/2021   CO2 23 03/12/2021   GLUCOSE 143 (H) 03/12/2021   BUN 10 03/12/2021   CREATININE 0.83 03/12/2021   BILITOT <0.2 03/12/2021   ALKPHOS 137 (H) 03/12/2021   AST 17 03/12/2021   ALT 28 03/12/2021   PROT 6.4 03/12/2021   ALBUMIN 4.1 03/12/2021   CALCIUM 9.0 03/12/2021   ANIONGAP 10 12/20/2018   EGFR 85 03/12/2021   Lab Results  Component Value Date   CHOL 237 (H) 09/11/2020   Lab Results  Component Value Date   HDL 43 09/11/2020   Lab Results  Component Value Date   LDLCALC 147 (H) 09/11/2020   Lab Results  Component Value Date   TRIG 255 (H) 09/11/2020   No results found for: CHOLHDL Lab Results  Component Value Date   HGBA1C 7.0 (H) 03/12/2021      Assessment & Plan:   Problem List Items Addressed This Visit       Endocrine   Adult hypothyroidism    Chronic, ongoing.  At this time level on low side recent labs -- recheck today. Continue Levothyroxine 175 MCG daily.  Educated her on this.        Relevant Orders   T4, free   TSH      Other   Obesity, Class III, BMI 40-49.9 (morbid obesity) ([place]) - Primary    Continue collaboration with weight management in [place], which she finds value with.  Recommended eating smaller high protein, low fat meals more frequently and exercising 30 mins a day 5 times a week with a goal of 10-15lb weight loss in the next 3 months. Patient voiced their understanding and motivation to adhere to these recommendations.       Cough in adult patient    Acute at this time, ongoing for over a week.  Negative Covid testing at home.  At this time start Augmentin for 7 days and recommend: - Increased rest - Increasing fluids - Acetaminophen as needed for fever/pain.  - Salt water gargling, chloraseptic spray and throat lozenges - Diabetic Tussin or Coricidin as needed.  Return to office in 4 weeks, sooner if worsening.      RLQ abdominal pain    Colicky in nature, recent CT scan in August reassuring, but discomfort is ongoing and changing.  Recommend she schedule follow-up with GI.  Recheck thyroid labs today.  Obtain ultrasound of RLQ.  Plan for follow-up in 4 weeks.       Meds ordered this encounter  Medications   amoxicillin-clavulanate (AUGMENTIN) 875-125 MG tablet    Sig: Take 1 tablet by mouth 2 (two) times daily for 7 days.    Dispense:  14 tablet    Refill:  0    Follow-up: Return in about 4 weeks (around 05/14/2021) for T2DM, HTN/HLD, MOOD, THYROID.    [name], NP

## 2021-04-16 NOTE — Assessment & Plan Note (Signed)
Chronic, ongoing.  At this time level on low side recent labs -- recheck today. Continue Levothyroxine 175 MCG daily.  Educated her on this.

## 2021-04-16 NOTE — Patient Instructions (Signed)
Diabetes Mellitus and Nutrition, Adult When you have diabetes, or diabetes mellitus, it is very important to have healthy eating habits because your blood sugar (glucose) levels are greatly affected by what you eat and drink. Eating healthy foods in the right amounts, at about the same times every day, can help you:  Control your blood glucose.  Lower your risk of heart disease.  Improve your blood pressure.  Reach or maintain a healthy weight. What can affect my meal plan? Every person with diabetes is different, and each person has different needs for a meal plan. Your health care provider may recommend that you work with a dietitian to make a meal plan that is best for you. Your meal plan may vary depending on factors such as:  The calories you need.  The medicines you take.  Your weight.  Your blood glucose, blood pressure, and cholesterol levels.  Your activity level.  Other health conditions you have, such as heart or kidney disease. How do carbohydrates affect me? Carbohydrates, also called carbs, affect your blood glucose level more than any other type of food. Eating carbs naturally raises the amount of glucose in your blood. Carb counting is a method for keeping track of how many carbs you eat. Counting carbs is important to keep your blood glucose at a healthy level, especially if you use insulin or take certain oral diabetes medicines. It is important to know how many carbs you can safely have in each meal. This is different for every person. Your dietitian can help you calculate how many carbs you should have at each meal and for each snack. How does alcohol affect me? Alcohol can cause a sudden decrease in blood glucose (hypoglycemia), especially if you use insulin or take certain oral diabetes medicines. Hypoglycemia can be a life-threatening condition. Symptoms of hypoglycemia, such as sleepiness, dizziness, and confusion, are similar to symptoms of having too much  alcohol.  Do not drink alcohol if: ? Your health care provider tells you not to drink. ? You are pregnant, may be pregnant, or are planning to become pregnant.  If you drink alcohol: ? Do not drink on an empty stomach. ? Limit how much you use to:  0-1 drink a day for women.  0-2 drinks a day for men. ? Be aware of how much alcohol is in your drink. In the [place] one drink equals one 12 oz bottle of beer (355 mL), one 5 oz glass of wine (148 mL), or one 1 oz glass of hard liquor (44 mL). ? Keep yourself hydrated with water, diet soda, or unsweetened iced tea.  Keep in mind that regular soda, juice, and other mixers may contain a lot of sugar and must be counted as carbs. What are tips for following this plan? Reading food labels  Start by checking the serving size on the "Nutrition Facts" label of packaged foods and drinks. The amount of calories, carbs, fats, and other nutrients listed on the label is based on one serving of the item. Many items contain more than one serving per package.  Check the total grams (g) of carbs in one serving. You can calculate the number of servings of carbs in one serving by dividing the total carbs by 15. For example, if a food has 30 g of total carbs per serving, it would be equal to 2 servings of carbs.  Check the number of grams (g) of saturated fats and trans fats in one serving. Choose foods that have   a low amount or none of these fats.  Check the number of milligrams (mg) of salt (sodium) in one serving. Most people should limit total sodium intake to less than 2,300 mg per day.  Always check the nutrition information of foods labeled as "low-fat" or "nonfat." These foods may be higher in added sugar or refined carbs and should be avoided.  Talk to your dietitian to identify your daily goals for nutrients listed on the label. Shopping  Avoid buying canned, pre-made, or processed foods. These foods tend to be high in fat, sodium, and added  sugar.  Shop around the outside edge of the grocery store. This is where you will most often find fresh fruits and vegetables, bulk grains, fresh meats, and fresh dairy. Cooking  Use low-heat cooking methods, such as baking, instead of high-heat cooking methods like deep frying.  Cook using healthy oils, such as olive, canola, or sunflower oil.  Avoid cooking with butter, cream, or high-fat meats. Meal planning  Eat meals and snacks regularly, preferably at the same times every day. Avoid going long periods of time without eating.  Eat foods that are high in fiber, such as fresh fruits, vegetables, beans, and whole grains. Talk with your dietitian about how many servings of carbs you can eat at each meal.  Eat 4-6 oz (112-168 g) of lean protein each day, such as lean meat, chicken, fish, eggs, or tofu. One ounce (oz) of lean protein is equal to: ? 1 oz (28 g) of meat, chicken, or fish. ? 1 egg. ?  cup (62 g) of tofu.  Eat some foods each day that contain healthy fats, such as avocado, nuts, seeds, and fish.   What foods should I eat? Fruits Berries. Apples. Oranges. Peaches. Apricots. Plums. Grapes. Mango. Papaya. Pomegranate. Kiwi. Cherries. Vegetables Lettuce. Spinach. Leafy greens, including kale, chard, collard greens, and mustard greens. Beets. Cauliflower. Cabbage. Broccoli. Carrots. Green beans. Tomatoes. Peppers. Onions. Cucumbers. Brussels sprouts. Grains Whole grains, such as whole-wheat or whole-grain bread, crackers, tortillas, cereal, and pasta. Unsweetened oatmeal. Quinoa. Brown or wild rice. Meats and other proteins Seafood. Poultry without skin. Lean cuts of poultry and beef. Tofu. Nuts. Seeds. Dairy Low-fat or fat-free dairy products such as milk, yogurt, and cheese. The items listed above may not be a complete list of foods and beverages you can eat. Contact a dietitian for more information. What foods should I avoid? Fruits Fruits canned with  syrup. Vegetables Canned vegetables. Frozen vegetables with butter or cream sauce. Grains Refined white flour and flour products such as bread, pasta, snack foods, and cereals. Avoid all processed foods. Meats and other proteins Fatty cuts of meat. Poultry with skin. Breaded or fried meats. Processed meat. Avoid saturated fats. Dairy Full-fat yogurt, cheese, or milk. Beverages Sweetened drinks, such as soda or iced tea. The items listed above may not be a complete list of foods and beverages you should avoid. Contact a dietitian for more information. Questions to ask a health care provider  Do I need to meet with a diabetes educator?  Do I need to meet with a dietitian?  What number can I call if I have questions?  When are the best times to check my blood glucose? Where to find more information:  American Diabetes Association: diabetes.org  Academy of Nutrition and Dietetics: www.eatright.org  National Institute of Diabetes and Digestive and Kidney Diseases: www.niddk.nih.gov  Association of Diabetes Care and Education Specialists: www.diabeteseducator.org Summary  It is important to have healthy eating   habits because your blood sugar (glucose) levels are greatly affected by what you eat and drink.  A healthy meal plan will help you control your blood glucose and maintain a healthy lifestyle.  Your health care provider may recommend that you work with a dietitian to make a meal plan that is best for you.  Keep in mind that carbohydrates (carbs) and alcohol have immediate effects on your blood glucose levels. It is important to count carbs and to use alcohol carefully. This information is not intended to replace advice given to you by your health care provider. Make sure you discuss any questions you have with your health care provider. Document Revised: 06/05/2019 Document Reviewed: 06/05/2019 Elsevier Patient Education  2021 Elsevier Inc.  

## 2021-04-16 NOTE — Assessment & Plan Note (Signed)
Colicky in nature, recent CT scan in August reassuring, but discomfort is ongoing and changing.  Recommend she schedule follow-up with GI.  Recheck thyroid labs today.  Obtain ultrasound of RLQ.  Plan for follow-up in 4 weeks.

## 2021-04-16 NOTE — Assessment & Plan Note (Signed)
Continue collaboration with weight management in [place], which she finds value with.  Recommended eating smaller high protein, low fat meals more frequently and exercising 30 mins a day 5 times a week with a goal of 10-15lb weight loss in the next 3 months. Patient voiced their understanding and motivation to adhere to these recommendations.

## 2021-04-16 NOTE — Assessment & Plan Note (Signed)
Acute at this time, ongoing for over a week.  Negative Covid testing at home.  At this time start Augmentin for 7 days and recommend: - Increased rest - Increasing fluids - Acetaminophen as needed for fever/pain.  - Salt water gargling, chloraseptic spray and throat lozenges - Diabetic Tussin or Coricidin as needed.  Return to office in 4 weeks, sooner if worsening.

## 2021-04-17 LAB — TSH: TSH: 0.285 u[IU]/mL — ABNORMAL LOW (ref 0.450–4.500)

## 2021-04-17 LAB — T4, FREE: Free T4: 1.3 ng/dL (ref 0.82–1.77)

## 2021-04-17 MED ORDER — LEVOTHYROXINE SODIUM 150 MCG PO TABS: 150.0000 ug | ORAL_TABLET | Freq: Every day | ORAL | 4 refills | Status: DC

## 2021-04-17 NOTE — Progress Notes (Signed)
Contacted via MyChart == needs 6 week lab only visit  Good afternoon Lori Knight, your thyroid lab has returned and remains on lower side.  TSH is lower and Free T4 slightly on upper side normal.  They are improving, but not 100%.  Lets try changing to 150 MCG daily and see where we get.  I will send this in and then have you return in 6 weeks for labs only.  Sound good? Keep being stellar!!  Thank you for allowing me to participate in your care.  I appreciate you. Kindest regards, Leeum Sankey

## 2021-04-17 NOTE — Addendum Note (Signed)
Addended by: [name] on: 04/17/2021 01:54 PM   Modules accepted: Orders

## 2021-04-24 MED ORDER — FLUCONAZOLE 150 MG PO TABS: 150.0000 mg | ORAL_TABLET | Freq: Once | ORAL | 0 refills | Status: AC

## 2021-04-24 NOTE — Addendum Note (Signed)
Addended by: [name] on: 04/24/2021 10:01 AM   Modules accepted: Orders

## 2021-04-27 ENCOUNTER — Ambulatory Visit: Payer: 59 | Admitting: Psychology

## 2021-04-28 ENCOUNTER — Telehealth (INDEPENDENT_AMBULATORY_CARE_PROVIDER_SITE_OTHER): Payer: Self-pay | Admitting: Psychology

## 2021-04-28 ENCOUNTER — Other Ambulatory Visit (INDEPENDENT_AMBULATORY_CARE_PROVIDER_SITE_OTHER): Payer: Self-pay | Admitting: Bariatrics

## 2021-04-28 ENCOUNTER — Telehealth (INDEPENDENT_AMBULATORY_CARE_PROVIDER_SITE_OTHER): Payer: 59 | Admitting: Psychology

## 2021-04-28 DIAGNOSIS — F5089 Other specified eating disorder: Secondary | ICD-10-CM

## 2021-04-28 NOTE — Telephone Encounter (Signed)
Dr.Brown 

## 2021-04-28 NOTE — Telephone Encounter (Signed)
  Office: [phone]  /  Fax: [phone]  Date of Call: April 28, 2021  Time of Call: 8:02am Provider: Glennie Isle, PsyD  CONTENT: This provider called Lori Knight to check-in as she did not present for today's MyChart Video Visit appointment at 8:00am. A HIPAA compliant voicemail was left requesting a call back. Of note, this provider stayed on the MyChart Video Visit appointment for 5 minutes prior to signing off per the clinic's grace period policy.    PLAN: This provider will wait for Lori Knight to call back. No further follow-up planned by this provider.

## 2021-04-29 ENCOUNTER — Telehealth: Payer: Self-pay

## 2021-04-29 NOTE — Telephone Encounter (Signed)
Mychart message sent.

## 2021-04-29 NOTE — Telephone Encounter (Signed)
Lori Knight from [facility] would like to know what is being ruled out with the ultrasound of the right lower quadrant.  She says if it needs to look at ovaries order should be changed to pelvic transvaginal ultrasound or to rule out gallbladder order needs to be for upper quadrant.  Please advise.

## 2021-04-30 ENCOUNTER — Other Ambulatory Visit: Payer: Self-pay | Admitting: Nurse Practitioner

## 2021-04-30 ENCOUNTER — Other Ambulatory Visit: Payer: Self-pay

## 2021-04-30 ENCOUNTER — Ambulatory Visit
Admission: RE | Admit: 2021-04-30 | Discharge: 2021-04-30 | Disposition: A | Discharge status: Home / Self Care | Payer: 59 | Source: Ambulatory Visit | Attending: Nurse Practitioner | Admitting: Nurse Practitioner

## 2021-04-30 DIAGNOSIS — R1031 Right lower quadrant pain: Secondary | ICD-10-CM | POA: Insufficient documentation

## 2021-04-30 NOTE — Telephone Encounter (Signed)
Imaging aware that there is concern for appendix and would like RLQ area assessed.  Not concerned for ovary or gall bladder.

## 2021-05-01 NOTE — Progress Notes (Signed)
Contacted via [place] afternoon [place], your ultrasound has returned and shows no abnormal findings.  If pain continues next step would be a repeat CT of the abdomen with contrast to further assess.  Any questions? Keep being awesome!!  Thank you for allowing me to participate in your care.  I appreciate you. Kindest regards, Lateshia Schmoker

## 2021-05-05 ENCOUNTER — Encounter (INDEPENDENT_AMBULATORY_CARE_PROVIDER_SITE_OTHER): Payer: Self-pay

## 2021-05-05 ENCOUNTER — Ambulatory Visit (INDEPENDENT_AMBULATORY_CARE_PROVIDER_SITE_OTHER): Payer: 59 | Admitting: Bariatrics

## 2021-05-07 ENCOUNTER — Telehealth: Payer: Self-pay | Admitting: Gastroenterology

## 2021-05-07 NOTE — Telephone Encounter (Signed)
Left voice message for patient to call our office to scheduled an appt. ===View-only below this line===   ----- Message -----      From:Taqwa Lathon      Sent:04/20/2021  3:30 AM EDT        HF:WYOVZC [name], MD   Subject:Appointment Request  Appointment Request From: Lori Knight  With Provider: [name], MD [[facility]]  Preferred Date Range: Any  Preferred Times: Any Time  Reason for visit: Office Visit  Comments: A pain inside that I had a cat scan for. My NP suggested I see Dr. [name] because I still feel it.

## 2021-05-15 ENCOUNTER — Ambulatory Visit (INDEPENDENT_AMBULATORY_CARE_PROVIDER_SITE_OTHER): Payer: 59 | Admitting: Nurse Practitioner

## 2021-05-15 ENCOUNTER — Other Ambulatory Visit: Payer: Self-pay

## 2021-05-15 ENCOUNTER — Encounter: Payer: Self-pay | Admitting: Nurse Practitioner

## 2021-05-15 VITALS — BP 124/84 | HR 76 | Temp 98.7°F | Ht 67.0 in | Wt 277.2 lb

## 2021-05-15 DIAGNOSIS — F309 Manic episode, unspecified: Secondary | ICD-10-CM

## 2021-05-15 DIAGNOSIS — E1159 Type 2 diabetes mellitus with other circulatory complications: Secondary | ICD-10-CM

## 2021-05-15 DIAGNOSIS — E039 Hypothyroidism, unspecified: Secondary | ICD-10-CM

## 2021-05-15 DIAGNOSIS — E1169 Type 2 diabetes mellitus with other specified complication: Secondary | ICD-10-CM | POA: Diagnosis not present

## 2021-05-15 DIAGNOSIS — D329 Benign neoplasm of meninges, unspecified: Secondary | ICD-10-CM

## 2021-05-15 DIAGNOSIS — I152 Hypertension secondary to endocrine disorders: Secondary | ICD-10-CM

## 2021-05-15 DIAGNOSIS — E785 Hyperlipidemia, unspecified: Secondary | ICD-10-CM

## 2021-05-15 DIAGNOSIS — R809 Proteinuria, unspecified: Secondary | ICD-10-CM

## 2021-05-15 DIAGNOSIS — E1129 Type 2 diabetes mellitus with other diabetic kidney complication: Secondary | ICD-10-CM

## 2021-05-15 LAB — MICROALBUMIN, URINE WAIVED
Creatinine, Urine Waived: 300 mg/dL (ref 10–300)
Microalb, Ur Waived: 150 mg/L — ABNORMAL HIGH (ref 0–19)

## 2021-05-15 LAB — BAYER DCA HB A1C WAIVED: HB A1C (BAYER DCA - WAIVED): 6.4 % — ABNORMAL HIGH (ref 4.8–5.6)

## 2021-05-15 NOTE — Assessment & Plan Note (Signed)
Followed by neurology, continue collaboration with neurology team and recent notes reviewed. 

## 2021-05-15 NOTE — Assessment & Plan Note (Signed)
Chronic, stable.  Denies SI/HI.  Followed by psychiatry, will continue this collaboration and current medication regimen as prescribed by them.  Return in 3 months. 

## 2021-05-15 NOTE — Assessment & Plan Note (Signed)
Chronic, ongoing.  At this time level on low side recent labs and changes made -- recheck today. Continue Levothyroxine 150 MCG daily.  Educated her on this.

## 2021-05-15 NOTE — Assessment & Plan Note (Signed)
Continue collaboration with weight management in [place], which she finds value with.  Recommended eating smaller high protein, low fat meals more frequently and exercising 30 mins a day 5 times a week with a goal of 10-15lb weight loss in the next 3 months. Patient voiced their understanding and motivation to adhere to these recommendations.

## 2021-05-15 NOTE — Patient Instructions (Signed)

## 2021-05-15 NOTE — Assessment & Plan Note (Signed)
Chronic, ongoing with recent A1C 7% -- will recheck today and adjust medications as needed.  Urine ALBtoday.  Continue Losartan for kidney protection.  Continue current medication regimen as prescribed by endocrinology and adjust as needed.  Educated on BS goals in morning and 2 hours after meals.  Return in 3 months.

## 2021-05-15 NOTE — Assessment & Plan Note (Signed)
Chronic, ongoing with BP at goal today in office.  Continue current medication regimen and adjust as needed.  Focus on DASH diet and regular exercise at home.  Recommend she check BP at few days a week at home and document for provider.  Continue to collaborate with weight management. Losartan for kidney protection.  Return in 3 months.

## 2021-05-15 NOTE — Assessment & Plan Note (Signed)
Chronic, ongoing with recent A1C 7% -- recheck today.  Urine ALB today.  Continue Losartan for kidney protection.  Continue current medication regimen as prescribed by endocrinology and adjust as needed.  Educated on BS goals in morning and 2 hours after meals.  Return in 3 months.  Will take over diabetes management in this office.

## 2021-05-15 NOTE — Progress Notes (Signed)
BP 124/84   Pulse 76   Temp 98.7 F (37.1 C) (Oral)   Ht 5\' 7"  (1.702 m)   Wt 277 lb 3.2 oz (125.7 kg)   SpO2 98%   BMI 43.42 kg/m    Subjective:    Patient ID: Lori Knight, female    DOB: 1971/02/28, 50 y.o.   MRN: [id number]  HPI: Lori Knight is a 50 y.o. female  Chief Complaint  Patient presents with   Obesity    4 week f/up    DIABETES Followed by endocrinology and last saw 02/03/21 with her A1c 7% on labs 03/12/21, was 6.3% in July.   Continues on Metformin 500 MG TID (intolerant to higher dosing), Trulicity 3 MG weekly. Her endo provider NP [place] left practice and she has not been satisfied with current provider, but wishes to stay with PCP for this now.   Her goal is weight loss, she is currently working with weight management in [place] and last saw 04/02/21. Hypoglycemic episodes:no Polydipsia/polyuria: occasional Visual disturbance: no Chest pain: no Paresthesias: no Glucose Monitoring: no             Accucheck frequency: BID             Fasting glucose: yesterday morning 143             Post prandial:             Evening:             Before meals: Taking Insulin?: no             Long acting insulin:             Short acting insulin: Blood Pressure Monitoring: not checking Retinal Examination: Up to Date Foot Exam: Up to Date Diabetic Education: Completed Pneumovax: Up to Date Influenza: Up to Date Aspirin: no    HYPOTHYROIDISM Followed by endocrinology and continues on Levothyroxine 150 MCG daily -- recent change to this due to lower levels.  TSH on 04/16/21 == 0.285. Thyroid control status:stable Satisfied with current treatment? yes Medication side effects: no Medication compliance: good compliance Etiology of hypothyroidism:  Recent dose adjustment: yes Fatigue: no Cold intolerance: no Heat intolerance: no Weight gain: no Weight loss: no Constipation: no Diarrhea/loose stools: no Palpitations: no Lower extremity edema:  no Anxiety/depressed mood: no   HYPERTENSION / HYPERLIPIDEMIA Continues on Losartan 100 MG, Amlodipine 5 MG and Atorvastatin 20 MG QHS.  Her last echo in 2019 noted EF 50-55%.     Sees neurosurgery, Dr. Lacinda Axon, for follow-up meningioma which remains stable.   Duration of hypertension: chronic BP monitoring frequency: not checking BP range:  BP medication side effects: no Duration of hyperlipidemia: chronic Aspirin: no Recent stressors: no Recurrent headaches: no Visual changes: no Palpitations: no Dyspnea: no Chest pain: no Lower extremity edema: no Dizzy/lightheaded: no  The 10-year ASCVD risk score (Arnett DK, et al., 2019) is: 5.5%   Values used to calculate the score:     Age: 57 years     Sex: Female     Is Non-Hispanic African American: No     Diabetic: Yes     Tobacco smoker: No     Systolic Blood Pressure: 010 mmHg     Is BP treated: Yes     HDL Cholesterol: 43 mg/dL     Total Cholesterol: 237 mg/dL  BIPOLAR DISORDER Currently taking Lamictal 150 MG BID, every other day is doing 150 MG QDAY + taking Topamax 25  MG BID.  April Lamictal level done. Mood status: stable Satisfied with current treatment?: yes Symptom severity: moderate  Duration of current treatment : chronic Side effects: no Medication compliance: good compliance Psychotherapy/counseling: at present Depressed mood: yes Anxious mood: for awhile Anhedonia: no Significant weight loss or gain: no Insomnia: none Fatigue: yes Feelings of worthlessness or guilt: no Impaired concentration/indecisiveness: yes Suicidal ideations: no Hopelessness: no Crying spells: yes Depression screen [facility] 2/9 05/15/2021 11/05/2020 09/11/2020 02/20/2020 01/02/2020  Decreased Interest 1 2 3  0 0  Down, Depressed, Hopeless 1 2 3  0 0  PHQ - 2 Score 2 4 6  0 0  Altered sleeping 0 2 3 0 -  Tired, decreased energy 1 2 3 1  -  Change in appetite 1 2 3 1  -  Feeling bad or failure about yourself  0 2 3 0 -  Trouble concentrating 2 1  3  0 -  Moving slowly or fidgety/restless 1 1 3  0 -  Suicidal thoughts 0 0 0 0 -  PHQ-9 Score 7 14 24 2  -  Difficult doing work/chores Not difficult at all Somewhat difficult - Not difficult at all -  Some recent data might be hidden     Relevant past medical, surgical, family and social history reviewed and updated as indicated. Interim medical history since our last visit reviewed. Allergies and medications reviewed and updated.  Review of Systems  Constitutional:  Negative for activity change, appetite change, diaphoresis, fatigue and fever.  Respiratory:  Negative for cough, chest tightness and shortness of breath.   Cardiovascular:  Negative for chest pain, palpitations and leg swelling.  Gastrointestinal: Negative.   Endocrine: Negative for cold intolerance, heat intolerance, polydipsia, polyphagia and polyuria.  Neurological: Negative.   Psychiatric/Behavioral: Negative.     Per HPI unless specifically indicated above     Objective:    BP 124/84   Pulse 76   Temp 98.7 F (37.1 C) (Oral)   Ht 5\' 7"  (1.702 m)   Wt 277 lb 3.2 oz (125.7 kg)   SpO2 98%   BMI 43.42 kg/m   Wt Readings from Last 3 Encounters:  05/15/21 277 lb 3.2 oz (125.7 kg)  04/16/21 280 lb (127 kg)  04/02/21 280 lb (127 kg)    Physical Exam Vitals and nursing note reviewed.  Constitutional:      General: She is awake. She is not in acute distress.    Appearance: She is well-developed and well-groomed. She is morbidly obese. She is not ill-appearing.  HENT:     Head: Normocephalic.     Right Ear: Hearing normal.     Left Ear: Hearing normal.  Eyes:     General: Lids are normal.        Right eye: No discharge.        Left eye: No discharge.     Conjunctiva/sclera: Conjunctivae normal.     Pupils: Pupils are equal, round, and reactive to light.  Neck:     Thyroid: No thyromegaly.     Vascular: No carotid bruit.  Cardiovascular:     Rate and Rhythm: Normal rate and regular rhythm.     Heart  sounds: Normal heart sounds. No murmur heard.   No gallop.  Pulmonary:     Effort: Pulmonary effort is normal. No accessory muscle usage or respiratory distress.     Breath sounds: Normal breath sounds.  Abdominal:     General: Bowel sounds are normal.     Palpations: Abdomen is soft.  Musculoskeletal:  Cervical back: Normal range of motion and neck supple.     Right lower leg: No edema.     Left lower leg: No edema.  Lymphadenopathy:     Cervical: No cervical adenopathy.  Skin:    General: Skin is warm and dry.  Neurological:     Mental Status: She is alert and oriented to person, place, and time.  Psychiatric:        Attention and Perception: Attention normal.        Mood and Affect: Mood normal.        Speech: Speech normal.        Behavior: Behavior normal. Behavior is cooperative.        Thought Content: Thought content normal.    Results for orders placed or performed in visit on 04/16/21  T4, free  Result Value Ref Range   Free T4 1.30 0.82 - 1.77 ng/dL  TSH  Result Value Ref Range   TSH 0.285 (L) 0.450 - 4.500 uIU/mL      Assessment & Plan:   Problem List Items Addressed This Visit       Cardiovascular and Mediastinum   Hypertension associated with diabetes ([employer])    Chronic, ongoing with BP at goal today in office.  Continue current medication regimen and adjust as needed.  Focus on DASH diet and regular exercise at home.  Recommend she check BP at few days a week at home and document for provider.  Continue to collaborate with weight management. Losartan for kidney protection.  Return in 3 months.      Relevant Orders   Bayer DCA Hb A1c Waived   Microalbumin, Urine Waived   CBC with Differential/Platelet     Endocrine   Adult hypothyroidism    Chronic, ongoing.  At this time level on low side recent labs and changes made -- recheck today. Continue Levothyroxine 150 MCG daily.  Educated her on this.        Relevant Orders   TSH   T4, free    Hyperlipidemia associated with type 2 diabetes mellitus (HCC)    Chronic, ongoing.  Continue on statin, recently started and is benefiting LDL, discussed with her.  Lipid panel today.  Return in 3 months.      Relevant Orders   Bayer DCA Hb A1c Waived   Lipid Panel w/o Chol/HDL Ratio   Type 2 diabetes mellitus with morbid obesity (HCC)    Chronic, ongoing with recent A1C 7% -- will recheck today and adjust medications as needed.  Urine ALBtoday.  Continue Losartan for kidney protection.  Continue current medication regimen as prescribed by endocrinology and adjust as needed.  Educated on BS goals in morning and 2 hours after meals.  Return in 3 months.        Relevant Orders   Bayer DCA Hb A1c Waived   Microalbumin, Urine Waived   Type 2 diabetes mellitus with proteinuria (HCC) - Primary    Chronic, ongoing with recent A1C 7% -- recheck today.  Urine ALB today.  Continue Losartan for kidney protection.  Continue current medication regimen as prescribed by endocrinology and adjust as needed.  Educated on BS goals in morning and 2 hours after meals.  Return in 3 months.  Will take over diabetes management in this office.        Nervous and Auditory   Meningioma [facility])    Followed by neurology, continue collaboration with neurology team and recent notes reviewed.  Other   Bipolar I disorder, single manic episode (HCC)    Chronic, stable.  Denies SI/HI.  Followed by psychiatry, will continue this collaboration and current medication regimen as prescribed by them.  Return in 3 months.      Obesity, Class III, BMI 40-49.9 (morbid obesity) ([place])    Continue collaboration with weight management in [place], which she finds value with.  Recommended eating smaller high protein, low fat meals more frequently and exercising 30 mins a day 5 times a week with a goal of 10-15lb weight loss in the next 3 months. Patient voiced their understanding and motivation to adhere to these  recommendations.         Follow up plan: Return in about 3 months (around 08/15/2021) for T2DM, HTN/HLD, THYROID, MOOD, VIT D.

## 2021-05-15 NOTE — Assessment & Plan Note (Signed)
Chronic, ongoing.  Continue on statin, recently started and is benefiting LDL, discussed with her.  Lipid panel today.  Return in 3 months.

## 2021-05-16 LAB — CBC WITH DIFFERENTIAL/PLATELET
Basophils Absolute: 0 10*3/uL (ref 0.0–0.2)
Basos: 1 %
EOS (ABSOLUTE): 0.1 10*3/uL (ref 0.0–0.4)
Eos: 1 %
Hematocrit: 44.5 % (ref 34.0–46.6)
Hemoglobin: 14.4 g/dL (ref 11.1–15.9)
Immature Grans (Abs): 0.1 10*3/uL (ref 0.0–0.1)
Immature Granulocytes: 1 %
Lymphocytes Absolute: 3 10*3/uL (ref 0.7–3.1)
Lymphs: 35 %
MCH: 27.1 pg (ref 26.6–33.0)
MCHC: 32.4 g/dL (ref 31.5–35.7)
MCV: 84 fL (ref 79–97)
Monocytes Absolute: 0.6 10*3/uL (ref 0.1–0.9)
Monocytes: 7 %
Neutrophils Absolute: 4.8 10*3/uL (ref 1.4–7.0)
Neutrophils: 55 %
Platelets: 302 10*3/uL (ref 150–450)
RBC: 5.32 x10E6/uL — ABNORMAL HIGH (ref 3.77–5.28)
RDW: 14.8 % (ref 11.7–15.4)
WBC: 8.6 10*3/uL (ref 3.4–10.8)

## 2021-05-16 LAB — LIPID PANEL W/O CHOL/HDL RATIO
Cholesterol, Total: 158 mg/dL (ref 100–199)
HDL: 35 mg/dL — ABNORMAL LOW (ref >39)
LDL Chol Calc (NIH): 86 mg/dL (ref 0–99)
Triglycerides: 218 mg/dL — ABNORMAL HIGH (ref 0–149)
VLDL Cholesterol Cal: 37 mg/dL (ref 5–40)

## 2021-05-16 LAB — TSH: TSH: 0.917 u[IU]/mL (ref 0.450–4.500)

## 2021-05-16 LAB — T4, FREE: Free T4: 1.32 ng/dL (ref 0.82–1.77)

## 2021-05-16 NOTE — Progress Notes (Signed)
Contacted via [place] morning Lori Knight, your labs have returned.  LDL is looking better this check, continue statin daily -- down from 147 to now 86.  Triglycerides remain a bit elevated, please ensure to eat fish and perhaps add a little fish oil to diet.  Thyroid levels are now normal, continue current dosing of Levothyroxine.  CBC stable.  Any questions?  Super proud of your success!!!!! Keep being stellar!!  Thank you for allowing me to participate in your care.  I appreciate you. Kindest regards, Calyn Sivils

## 2021-05-18 NOTE — Progress Notes (Signed)
  Office: [phone]  /  Fax: [phone]    Date: June 01, 2021   Appointment Start Time: 8:01am Duration: 30 minutes Provider: Glennie Isle, Psy.D. Type of Session: Individual Therapy  Location of Patient: Home (private location) Location of Provider: Provider's Home (private office) Type of Contact: Telepsychological Visit via MyChart Video Visit  Session Content: Lori Knight is a 50 y.o. female presenting for a follow-up appointment to address the previously established treatment goal of increasing coping skills.Today's appointment was a telepsychological visit due to COVID-19. Lori Knight provided verbal consent for today's telepsychological appointment and she is aware she is responsible for securing confidentiality on her end of the session. Prior to proceeding with today's appointment, Lori Knight's physical location at the time of this appointment was obtained as well a phone number she could be reached at in the event of technical difficulties. Lori Knight and this provider participated in today's telepsychological service.   This provider conducted a brief check-in. Lori Knight shared about recent events, including her trip to the beach. She discussed she continues to lose weight, adding she is engaging in portion control and making better choices. Discussed changes/progress to date (e.g., weight loss, changes to medications). Reviewed self-compassion. Lori Knight indicated she is currently listening to Self- Compassion by Dr. [name]. Reflected on the impact of increased self-compassion on her journey with the clinic (e.g., engagement in emotional eating behaviors) and other aspects of her life. Lori Knight was receptive to further discussing self-compassion with her primary therapist. Lori Knight was receptive to today's appointment as evidenced by openness to sharing, responsiveness to feedback, and willingness to implement discussed strategies .  Mental Status Examination:  Appearance: well groomed and appropriate  hygiene  Behavior: appropriate to circumstances Mood: euthymic Affect: mood congruent Speech: normal in rate, volume, and tone Eye Contact: appropriate Psychomotor Activity: appropriate Gait: unable to assess Thought Process: linear, logical, and goal directed  Thought Content/Perception: no hallucinations, delusions, bizarre thinking or behavior reported or observed and no evidence or endorsement of suicidal and homicidal ideation, plan, and intent Orientation: time, person, place, and purpose of appointment Memory/Concentration: memory, attention, language, and fund of knowledge intact  Insight/Judgment: good  Interventions:  Conducted a brief chart review Provided empathic reflections and validation Reviewed content from the previous session Employed supportive psychotherapy interventions to facilitate reduced distress and to improve coping skills with identified stressors  DSM-5 Diagnosis(es):  Other Specified Bipolar and Related Disorder, Emotional Eating Behaviors   Treatment Goal & Progress: During the initial appointment with this provider, the following treatment goal was established: increase coping skills. Lori Knight demonstrated progress in her goal as evidenced by increased awareness of hunger patterns, increased awareness of triggers for emotional eating behaviors, and reduction in emotional eating behaviors . Lori Knight also continues to demonstrate willingness to engage in learned skill(s).  Plan: As previously planned, today was Lori Knight's last appointment with this provider. She acknowledged understanding that she may request a follow-up appointment with this provider in the future as long as she is still established with the clinic. She continues to meet with her primary therapist. No further follow-up planned by this provider.

## 2021-05-19 ENCOUNTER — Encounter (INDEPENDENT_AMBULATORY_CARE_PROVIDER_SITE_OTHER): Payer: Self-pay | Admitting: Bariatrics

## 2021-05-19 ENCOUNTER — Other Ambulatory Visit: Payer: Self-pay

## 2021-05-19 ENCOUNTER — Ambulatory Visit (INDEPENDENT_AMBULATORY_CARE_PROVIDER_SITE_OTHER): Payer: 59 | Admitting: Bariatrics

## 2021-05-19 VITALS — BP 133/84 | HR 72 | Temp 98.1°F | Ht 67.0 in | Wt 276.0 lb

## 2021-05-19 DIAGNOSIS — E66813 Obesity, class 3: Secondary | ICD-10-CM

## 2021-05-19 DIAGNOSIS — E1159 Type 2 diabetes mellitus with other circulatory complications: Secondary | ICD-10-CM

## 2021-05-19 DIAGNOSIS — Z6841 Body Mass Index (BMI) 40.0 and over, adult: Secondary | ICD-10-CM

## 2021-05-19 DIAGNOSIS — F5089 Other specified eating disorder: Secondary | ICD-10-CM | POA: Diagnosis not present

## 2021-05-19 DIAGNOSIS — I152 Hypertension secondary to endocrine disorders: Secondary | ICD-10-CM

## 2021-05-19 DIAGNOSIS — E1129 Type 2 diabetes mellitus with other diabetic kidney complication: Secondary | ICD-10-CM

## 2021-05-19 DIAGNOSIS — R809 Proteinuria, unspecified: Secondary | ICD-10-CM

## 2021-05-19 MED ORDER — TOPIRAMATE 25 MG PO TABS: 25.0000 mg | ORAL_TABLET | Freq: Two times a day (BID) | ORAL | 0 refills | Status: DC

## 2021-05-20 ENCOUNTER — Encounter (INDEPENDENT_AMBULATORY_CARE_PROVIDER_SITE_OTHER): Payer: Self-pay | Admitting: Bariatrics

## 2021-05-20 NOTE — Progress Notes (Signed)
Chief Complaint:   OBESITY Lori Knight is here to discuss her progress with her obesity treatment plan along with follow-up of her obesity related diagnoses. Lori Knight is on the Category 3 Plan and states she is following her eating plan approximately 50% of the time. Lori Knight states she is counting steps 5,000  7 times per week.  Today's visit was #: 10 Starting weight: 289 lbs Starting date: 09/11/2020 Today's weight: 276 lbs Today's date: 05/19/2021 Total lbs lost to date: 13 lbs Total lbs lost since last in-office visit: 4 lbs  Interim History: Lori Knight is down an additional 4 lbs and doing well overall. She is eating more protein.  Subjective:   1. Hypertension associated with diabetes ([place]) Lori Knight is currently taking Norvas and Cozaar.  2. Type 2 diabetes mellitus with proteinuria (HCC) Lori Knight is currently on extra Trulicity. She notes less hunger. Her blood sugars improving.   3. Other disorder of eating Lori Knight is taking her medication as directed. She notes decreased hunger.  Assessment/Plan:   1. Hypertension associated with diabetes ([place]) Lori Knight will continue her medications. She is working on healthy weight loss and exercise to improve blood pressure control. We will watch for signs of hypotension as she continues her lifestyle modifications.  2. Type 2 diabetes mellitus with proteinuria (HCC) Lori Knight will continue her medications. [name], neuropathy, limb loss, blindness, coronary artery disease, and death. Intensive lifestyle modification including diet, exercise and weight loss are the first line of treatment for diabetes.   3. Other disorder of eating Behavior modification techniques were discussed today to help Lori Knight deal with her emotional/non-hunger eating behaviors.  We will refill Topamax 25 mg twice daily for 1 month with no refills. Orders and follow up as documented in  patient record.    - topiramate (TOPAMAX) 25 MG tablet; Take 1 tablet (25 mg total) by mouth 2 (two) times daily.  Dispense: 60 tablet; Refill: 0  4. Obesity, current BMI 43.4 Lori Knight is currently in the action stage of change. As such, her goal is to continue with weight loss efforts. She has agreed to the Category 3 Plan.   Lori Knight will continue meal planning and intentional eating. Strategies for the holidays were given today. We discussed eating better snacks.   Exercise goals:  Lori Knight will continue to follow her steps per tracker 5,000 steps.  Behavioral modification strategies: increasing lean protein intake, decreasing simple carbohydrates, increasing vegetables, increasing water intake, decreasing eating out, no skipping meals, meal planning and cooking strategies, keeping healthy foods in the home, and planning for success.  Lori Knight has agreed to follow-up with our clinic in 4 weeks. She was informed of the importance of frequent follow-up visits to maximize her success with intensive lifestyle modifications for her multiple health conditions.   Objective:   Blood pressure 133/84, pulse 72, temperature 98.1 F (36.7 C), height 5\' 7"  (1.702 m), weight 276 lb (125.2 kg), SpO2 97 %. Body mass index is 43.23 kg/m.  General: Cooperative, alert, well developed, in no acute distress. HEENT: Conjunctivae and lids unremarkable. Cardiovascular: Regular rhythm.  Lungs: Normal work of breathing. Neurologic: No focal deficits.   Lab Results  Component Value Date   CREATININE 0.83 03/12/2021   BUN 10 03/12/2021   NA 143 03/12/2021   K 4.3 03/12/2021   CL 105 03/12/2021   CO2 23 03/12/2021   Lab Results  Component Value Date   ALT  28 03/12/2021   AST 17 03/12/2021   ALKPHOS 137 (H) 03/12/2021   BILITOT <0.2 03/12/2021   Lab Results  Component Value Date   HGBA1C 6.4 (H) 05/15/2021   HGBA1C 7.0 (H) 03/12/2021   HGBA1C 6.7% 03/19/2020   HGBA1C 7.8 05/31/2019   HGBA1C 7.8 05/31/2019    Lab Results  Component Value Date   INSULIN 28.7 (H) 09/11/2020   Lab Results  Component Value Date   TSH 0.917 05/15/2021   Lab Results  Component Value Date   CHOL 158 05/15/2021   HDL 35 (L) 05/15/2021   LDLCALC 86 05/15/2021   TRIG 218 (H) 05/15/2021   Lab Results  Component Value Date   VD25OH 55.0 03/12/2021   VD25OH 51.0 09/11/2020   VD25OH 45.9 06/20/2019   Lab Results  Component Value Date   WBC 8.6 05/15/2021   HGB 14.4 05/15/2021   HCT 44.5 05/15/2021   MCV 84 05/15/2021   PLT 302 05/15/2021   Lab Results  Component Value Date   IRON 57 12/20/2018   TIBC 388 12/20/2018   FERRITIN 122 12/20/2018   Attestation Statements:   Reviewed by clinician on day of visit: allergies, medications, problem list, medical history, surgical history, family history, social history, and previous encounter notes.  I, Lizbeth Bark, RMA, am acting as Location manager for [employer], DO.   I have reviewed the above documentation for accuracy and completeness, and I agree with the above. Jearld Lesch, DO

## 2021-06-01 ENCOUNTER — Telehealth: Payer: Self-pay | Admitting: Nurse Practitioner

## 2021-06-01 ENCOUNTER — Ambulatory Visit: Payer: Self-pay

## 2021-06-01 ENCOUNTER — Other Ambulatory Visit: Payer: 59

## 2021-06-01 ENCOUNTER — Telehealth (INDEPENDENT_AMBULATORY_CARE_PROVIDER_SITE_OTHER): Payer: 59 | Admitting: Psychology

## 2021-06-01 DIAGNOSIS — F3189 Other bipolar disorder: Secondary | ICD-10-CM | POA: Diagnosis not present

## 2021-06-01 NOTE — Telephone Encounter (Signed)
Pt didn't need calling back d/t issues already resolved. Pt's appt was canceled d/t labs already drawn   Message from Scherrie Gerlach sent at 06/01/2021 12:41 PM EST  Nothing further needed.   ----- Message from Scherrie Gerlach sent at 06/01/2021 12:40 PM EST -----  Pt's appt has been cancelled.  Yes, these are labs she already had done.  Pt does not need appt.   ----- Message from Luciana Axe sent at 06/01/2021 10:21 AM EST -----  Pt is calling to ask why is she coming in today for her 6 week labs? Pt had labs done on 05/15/21. She believes that that she had these labs completed already. Please advise.

## 2021-06-16 ENCOUNTER — Ambulatory Visit (INDEPENDENT_AMBULATORY_CARE_PROVIDER_SITE_OTHER): Payer: 59 | Admitting: Psychology

## 2021-06-16 DIAGNOSIS — F3189 Other bipolar disorder: Secondary | ICD-10-CM | POA: Diagnosis not present

## 2021-06-16 DIAGNOSIS — F309 Manic episode, unspecified: Secondary | ICD-10-CM

## 2021-06-16 NOTE — Progress Notes (Signed)
[facility] Counselor/Therapist Progress Note  Patient ID: Lori Knight, MRN: [id number],    Date: 06/15/2021  Time Spent: 3:03pm - 3:55pm:  52 minutes  Treatment Type: Individual Therapy.  Reported Symptoms: anxiety   Mental Status Exam: Appearance:  Neat and Well Groomed     Behavior: Appropriate  Motor: Normal  Speech/Language:  Clear and Coherent and Normal Rate  Affect: Appropriate  Mood: anxious  Thought process: normal  Thought content:   WNL  Sensory/Perceptual disturbances:   WNL  Orientation: oriented to person, place, time/date, and situation  Attention: Good  Concentration: Good  Memory: WNL  Fund of knowledge:  Good  Insight:   Good  Judgment:  Good  Impulse Control: Good   Subjective:   Lori Knight participated home, via video, and consented to treatment. Therapist participated from office. Lori Knight reviewed the events of the past two months. Lori Knight endorsed financial stressors which she believes has exacerbated her anxiety symptoms.  Lori Knight endorsed feelings of tension and feeling less control in her life due to the aforementioned financial stressors and the lack of a "safety net".  We processed this during the session and Lori Knight identified numerous factors including her reliance on her partner for financial support, her worry that she could become a financial burden, and in that this overtime might affect her relationship in a negative and significant manner.  Therapist identified negative self talk during the session including "I am not successful why should they do business with me ".  This is in relation to disclosing her current financial stressors to friends or coworkers and noted that she has a long history of catastrophizing.  She noted onset of this high level anxiety since October 2022 and endorsed rumination.  Therapist provided psychoeducation regarding anxiety and cognitive distortions.  Therapist employed ACT principles including  identifying areas of control and lack of control.  Therapist encouraged Lori Knight to identify areas of control and to engage in those areas in a healthy reasonable and sustainable manner and acknowledge progress made in this area.  Reviewed ways to manage this via challenging negative thoughts identifying positive details facts, sitting boundaries regarding prolonged rumination, and validating ones own feelings of anxiety during stressful situations.  Therapist modeled challenging negative thoughts during the session and encouraged Lori Knight to do so between sessions.  Lori Knight was engaged and motivated during the session and expressed her commitment towards managing her anxiety via the tools reviewed during the session.  Therapist praised Lori Knight for her effort during the session and her mindfulness of her mood between sessions, validated Lori Knight is experiencing feelings, encourage self-care, and provided supportive therapy.  Interventions: Cognitive Behavioral Therapy  Diagnosis:Bipolar I disorder, single manic episode ([place])   Plan: Patient is to use CBT, ACT, BA, mindfulness, and coping skills to help manage decrease symptoms associated with their diagnosis.   Long-term goal:   Reduce overall level, frequency, and intensity of the feelings of depression and anxiety evidenced by negative self-talk, rumination, and poor self-esteem  from 6 to 7 days/week to 0 to 1 days/week per client report for at least 3 consecutive months.  Short-term goal:  Verbally express understanding of the relationship between feelings of depression, anxiety and their impact on thinking patterns and behaviors.  Verbalize an understanding of the role that distorted thinking plays in creating fears, excessive worry, and ruminations.  Lori Irish, LCSW

## 2021-06-24 ENCOUNTER — Encounter (INDEPENDENT_AMBULATORY_CARE_PROVIDER_SITE_OTHER): Payer: Self-pay | Admitting: Bariatrics

## 2021-06-30 ENCOUNTER — Encounter (INDEPENDENT_AMBULATORY_CARE_PROVIDER_SITE_OTHER): Payer: Self-pay | Admitting: Bariatrics

## 2021-06-30 ENCOUNTER — Ambulatory Visit (INDEPENDENT_AMBULATORY_CARE_PROVIDER_SITE_OTHER): Payer: 59 | Admitting: Bariatrics

## 2021-06-30 ENCOUNTER — Other Ambulatory Visit: Payer: Self-pay

## 2021-06-30 VITALS — BP 115/80 | HR 84 | Temp 97.8°F | Ht 67.0 in | Wt 273.0 lb

## 2021-06-30 DIAGNOSIS — F5089 Other specified eating disorder: Secondary | ICD-10-CM

## 2021-06-30 DIAGNOSIS — Z6841 Body Mass Index (BMI) 40.0 and over, adult: Secondary | ICD-10-CM

## 2021-06-30 DIAGNOSIS — E1169 Type 2 diabetes mellitus with other specified complication: Secondary | ICD-10-CM

## 2021-06-30 MED ORDER — TOPIRAMATE 50 MG PO TABS: 50.0000 mg | ORAL_TABLET | Freq: Two times a day (BID) | ORAL | 1 refills | Status: DC

## 2021-07-01 ENCOUNTER — Other Ambulatory Visit (INDEPENDENT_AMBULATORY_CARE_PROVIDER_SITE_OTHER): Payer: Self-pay | Admitting: Bariatrics

## 2021-07-01 DIAGNOSIS — F5089 Other specified eating disorder: Secondary | ICD-10-CM

## 2021-07-01 NOTE — Progress Notes (Signed)
Chief Complaint:   OBESITY Lori Knight is here to discuss her progress with her obesity treatment plan along with follow-up of her obesity related diagnoses. Lori Knight is on the Category 3 Plan and states she is following her eating plan approximately 30% of the time. Lori Knight states she is walking 5,000 steps 4 times per week.  Today's visit was #: 11 Starting weight: 289 lbs Starting date: 09/11/2020 Today's weight: 273 lbs Today's date: 06/30/2021 Total lbs lost to date: 16 lbs Total lbs lost since last in-office visit: 3 lbs  Interim History: Shauntia is down 3 lbs since her last visit. She has been on a cruise and multiple celebrations.  Subjective:   1. Type 2 diabetes mellitus with other specified complication, without long-term current use of insulin (HCC) Lori Knight is taking Trulicity and Metformin currently.   2. Other disorder of eating Lori Knight is currently taking Topamax.   Assessment/Plan:   1. Type 2 diabetes mellitus with other specified complication, without long-term current use of insulin (HCC) Lori Knight will continue taking her medications. [name], neuropathy, limb loss, blindness, coronary artery disease, and death. Intensive lifestyle modification including diet, exercise and weight loss are the first line of treatment for diabetes.   2. Other disorder of eating We will refill Topamax 50 mg twice daily for 1 month with 1 refill. Behavior modification techniques were discussed today to help Lori Knight.  Orders and follow up as documented in patient record.    - topiramate (TOPAMAX) 50 MG tablet; Take 1 tablet (50 mg total) by mouth 2 (two) times daily.  Dispense: 60 tablet; Refill: 1  3. Obesity, current BMI 42.9 Lori Knight is currently in the action stage of change. As such, her goal is to continue with weight loss efforts. She has agreed  to the Category 3 Plan.   Lori Knight will continue meal planning and she will be mindful eating. She will limit eating out to once a week.  Exercise goals:  As is. Lori Knight will use Fitbit and weights.   Behavioral modification strategies: increasing lean protein intake, decreasing simple carbohydrates, increasing vegetables, increasing water intake, decreasing eating out, no skipping meals, meal planning and cooking strategies, keeping healthy foods in the home, and planning for success.  Lori Knight has agreed to follow-up with our clinic in 6-7 weeks. She was informed of the importance of frequent follow-up visits to maximize her success with intensive lifestyle modifications for her multiple health conditions.   Objective:   Blood pressure 115/80, pulse 84, temperature 97.8 F (36.6 C), height 5\' 7"  (1.702 m), weight 273 lb (123.8 kg), SpO2 96 %. Body mass index is 42.76 kg/m.  General: Cooperative, alert, well developed, in no acute distress. HEENT: Conjunctivae and lids unremarkable. Cardiovascular: Regular rhythm.  Lungs: Normal work of breathing. Neurologic: No focal deficits.   Lab Results  Component Value Date   CREATININE 0.83 03/12/2021   BUN 10 03/12/2021   NA 143 03/12/2021   K 4.3 03/12/2021   CL 105 03/12/2021   CO2 23 03/12/2021   Lab Results  Component Value Date   ALT 28 03/12/2021   AST 17 03/12/2021   ALKPHOS 137 (H) 03/12/2021   BILITOT <0.2 03/12/2021   Lab Results  Component Value Date   HGBA1C 6.4 (H) 05/15/2021   HGBA1C 7.0 (H) 03/12/2021   HGBA1C 6.7% 03/19/2020   HGBA1C 7.8 05/31/2019  HGBA1C 7.8 05/31/2019   Lab Results  Component Value Date   INSULIN 28.7 (H) 09/11/2020   Lab Results  Component Value Date   TSH 0.917 05/15/2021   Lab Results  Component Value Date   CHOL 158 05/15/2021   HDL 35 (L) 05/15/2021   LDLCALC 86 05/15/2021   TRIG 218 (H) 05/15/2021   Lab Results  Component Value Date   VD25OH 55.0 03/12/2021   VD25OH 51.0  09/11/2020   VD25OH 45.9 06/20/2019   Lab Results  Component Value Date   WBC 8.6 05/15/2021   HGB 14.4 05/15/2021   HCT 44.5 05/15/2021   MCV 84 05/15/2021   PLT 302 05/15/2021   Lab Results  Component Value Date   IRON 57 12/20/2018   TIBC 388 12/20/2018   FERRITIN 122 12/20/2018   Attestation Statements:   Reviewed by clinician on day of visit: allergies, medications, problem list, medical history, surgical history, family history, social history, and previous encounter notes.  I, Lizbeth Bark, RMA, am acting as Location manager for [employer], DO.  I have reviewed the above documentation for accuracy and completeness, and I agree with the above. Jearld Lesch, DO

## 2021-07-07 ENCOUNTER — Encounter (INDEPENDENT_AMBULATORY_CARE_PROVIDER_SITE_OTHER): Payer: Self-pay | Admitting: Bariatrics

## 2021-07-08 ENCOUNTER — Encounter (INDEPENDENT_AMBULATORY_CARE_PROVIDER_SITE_OTHER): Payer: Self-pay | Admitting: Bariatrics

## 2021-07-08 ENCOUNTER — Other Ambulatory Visit: Payer: Self-pay | Admitting: Nurse Practitioner

## 2021-07-08 ENCOUNTER — Encounter: Payer: Self-pay | Admitting: Nurse Practitioner

## 2021-07-08 DIAGNOSIS — K219 Gastro-esophageal reflux disease without esophagitis: Secondary | ICD-10-CM

## 2021-07-08 MED ORDER — ATORVASTATIN CALCIUM 20 MG PO TABS: 20.0000 mg | ORAL_TABLET | Freq: Every day | ORAL | 4 refills | Status: DC

## 2021-07-08 NOTE — Telephone Encounter (Signed)
Requested Prescriptions  Pending Prescriptions Disp Refills   omeprazole (PRILOSEC) 40 MG capsule [Pharmacy Med Name: OMEPRAZOLE DR 40 MG CAPSULE] 90 capsule 4    Sig: TAKE 1 CAPSULE BY MOUTH EVERY DAY     Gastroenterology: Proton Pump Inhibitors Passed - 07/08/2021  1:32 AM      Passed - Valid encounter within last 12 months    Recent Outpatient Visits          1 month ago Type 2 diabetes mellitus with proteinuria ([place])   [place], Jolene T, NP   2 months ago Obesity, Class III, BMI 40-49.9 (morbid obesity) ([place])   [place], Jolene T, NP   3 months ago Type 2 diabetes mellitus with morbid obesity ([place])   [place], Jolene T, NP   4 months ago Abdominal wall pain   [facility] McElwe[place], Lauren A, NP   6 months ago Acute pain of left shoulder   [place], Barbaraann Faster, NP      Future Appointments            In 1 month [place], Barbaraann Faster, NP [employer], [place]   In 1 month McGowan, Gordan Payment [place]

## 2021-07-15 ENCOUNTER — Encounter (INDEPENDENT_AMBULATORY_CARE_PROVIDER_SITE_OTHER): Payer: Self-pay | Admitting: Bariatrics

## 2021-07-15 ENCOUNTER — Encounter: Payer: Self-pay | Admitting: Nurse Practitioner

## 2021-07-15 DIAGNOSIS — I152 Hypertension secondary to endocrine disorders: Secondary | ICD-10-CM

## 2021-07-15 DIAGNOSIS — E1159 Type 2 diabetes mellitus with other circulatory complications: Secondary | ICD-10-CM

## 2021-07-15 DIAGNOSIS — K219 Gastro-esophageal reflux disease without esophagitis: Secondary | ICD-10-CM

## 2021-07-15 NOTE — Telephone Encounter (Signed)
Pt last seen by Dr. Brown.  

## 2021-07-16 ENCOUNTER — Ambulatory Visit (INDEPENDENT_AMBULATORY_CARE_PROVIDER_SITE_OTHER): Payer: 59 | Admitting: Psychology

## 2021-07-16 ENCOUNTER — Other Ambulatory Visit (INDEPENDENT_AMBULATORY_CARE_PROVIDER_SITE_OTHER): Payer: Self-pay

## 2021-07-16 DIAGNOSIS — F309 Manic episode, unspecified: Secondary | ICD-10-CM

## 2021-07-16 DIAGNOSIS — F5089 Other specified eating disorder: Secondary | ICD-10-CM

## 2021-07-16 MED ORDER — ATORVASTATIN CALCIUM 20 MG PO TABS: 20.0000 mg | ORAL_TABLET | Freq: Every day | ORAL | 4 refills | Status: DC

## 2021-07-16 MED ORDER — LOSARTAN POTASSIUM 50 MG PO TABS: 100.0000 mg | ORAL_TABLET | Freq: Every day | ORAL | 4 refills | Status: DC

## 2021-07-16 MED ORDER — TRULICITY 3 MG/0.5ML ~~LOC~~ SOAJ: 3.0000 mg | SUBCUTANEOUS | 4 refills | Status: DC

## 2021-07-16 MED ORDER — TOPIRAMATE 50 MG PO TABS: 50.0000 mg | ORAL_TABLET | Freq: Two times a day (BID) | ORAL | 0 refills | Status: DC

## 2021-07-16 MED ORDER — LEVOTHYROXINE SODIUM 150 MCG PO TABS: 150.0000 ug | ORAL_TABLET | Freq: Every day | ORAL | 4 refills | Status: DC

## 2021-07-16 MED ORDER — OMEPRAZOLE 40 MG PO CPDR: DELAYED_RELEASE_CAPSULE | ORAL | 4 refills | Status: DC

## 2021-07-16 MED ORDER — AMLODIPINE BESYLATE 5 MG PO TABS: 5.0000 mg | ORAL_TABLET | Freq: Every day | ORAL | 4 refills | Status: DC
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
## 2021-07-16 NOTE — Telephone Encounter (Signed)
Dr. Owens Shark, This is for Lori Knight, 02/27/1966. I confirmed with Shali that Lori Knight said he was to get time releases Metformin.

## 2021-07-17 ENCOUNTER — Other Ambulatory Visit: Payer: Self-pay

## 2021-07-17 DIAGNOSIS — K219 Gastro-esophageal reflux disease without esophagitis: Secondary | ICD-10-CM

## 2021-07-17 MED ORDER — LEVOTHYROXINE SODIUM 150 MCG PO TABS: 150.0000 ug | ORAL_TABLET | Freq: Every day | ORAL | 4 refills | Status: DC

## 2021-07-17 MED ORDER — OMEPRAZOLE 40 MG PO CPDR: DELAYED_RELEASE_CAPSULE | ORAL | 4 refills | Status: DC

## 2021-07-17 MED ORDER — ATORVASTATIN CALCIUM 20 MG PO TABS: 20.0000 mg | ORAL_TABLET | Freq: Every day | ORAL | 4 refills | Status: DC

## 2021-07-20 ENCOUNTER — Other Ambulatory Visit: Payer: Self-pay | Admitting: Nurse Practitioner

## 2021-07-20 DIAGNOSIS — Z1231 Encounter for screening mammogram for malignant neoplasm of breast: Secondary | ICD-10-CM

## 2021-07-22 ENCOUNTER — Other Ambulatory Visit (INDEPENDENT_AMBULATORY_CARE_PROVIDER_SITE_OTHER): Payer: Self-pay | Admitting: Bariatrics

## 2021-07-22 DIAGNOSIS — F5089 Other specified eating disorder: Secondary | ICD-10-CM

## 2021-07-30 ENCOUNTER — Other Ambulatory Visit: Payer: Managed Care, Other (non HMO)

## 2021-07-30 ENCOUNTER — Other Ambulatory Visit: Payer: Self-pay

## 2021-07-30 ENCOUNTER — Encounter: Payer: Self-pay | Admitting: Nurse Practitioner

## 2021-07-30 DIAGNOSIS — F309 Manic episode, unspecified: Secondary | ICD-10-CM

## 2021-07-30 NOTE — Telephone Encounter (Signed)
Appt scheduled

## 2021-07-31 NOTE — Progress Notes (Signed)
Contacted via [place] morning Lori Knight, still waiting on Topamax and Lactic Acid levels.  BMP shows mild elevation in sodium, salt, would recommend increasing water intake and cut back on salt.  Kidney function, creatinine and eGFR, is normal.  Any questions? Keep being amazing!!  Thank you for allowing me to participate in your care.  I appreciate you. Kindest regards, Charlie Char

## 2021-08-01 LAB — BASIC METABOLIC PANEL
BUN/Creatinine Ratio: 18 (ref 9–23)
BUN: 14 mg/dL (ref 6–24)
CO2: 21 mmol/L (ref 20–29)
Calcium: 9.7 mg/dL (ref 8.7–10.2)
Chloride: 109 mmol/L — ABNORMAL HIGH (ref 96–106)
Creatinine, Ser: 0.77 mg/dL (ref 0.57–1.00)
Glucose: 104 mg/dL — ABNORMAL HIGH (ref 70–99)
Potassium: 4.4 mmol/L (ref 3.5–5.2)
Sodium: 145 mmol/L — ABNORMAL HIGH (ref 134–144)
eGFR: 93 mL/min/{1.73_m2} (ref >59)

## 2021-08-01 LAB — LACTIC ACID, PLASMA: Lactate, Ven: 8.6 mg/dL (ref 4.8–25.7)

## 2021-08-01 LAB — TOPIRAMATE LEVEL: Topiramate Lvl: 2.2 ug/mL (ref 2.0–25.0)

## 2021-08-01 NOTE — Progress Notes (Signed)
Contacted via [place] morning Lori Knight, your remainder of labs have returned and are all normal ranges.  Definitely alert your weight management provider to current symptoms you are experiencing, may need some changes. Keep being awesome!!  Thank you for allowing me to participate in your care.  I appreciate you. Kindest regards, Carletta Feasel

## 2021-08-05 ENCOUNTER — Other Ambulatory Visit: Payer: Self-pay | Admitting: Nurse Practitioner

## 2021-08-05 ENCOUNTER — Ambulatory Visit (INDEPENDENT_AMBULATORY_CARE_PROVIDER_SITE_OTHER): Payer: 59 | Admitting: Psychology

## 2021-08-05 DIAGNOSIS — F309 Manic episode, unspecified: Secondary | ICD-10-CM | POA: Diagnosis not present

## 2021-08-05 NOTE — Progress Notes (Signed)
[facility] Counselor/Therapist Progress Note  Patient ID: Lori Knight, MRN: [id number],    Date: 08/05/2021  Time Spent: 11:08 am - 11:52 am:  44 minutes  Treatment Type: Individual Therapy.  Reported Symptoms: anxiety   Mental Status Exam: Appearance:  Neat and Well Groomed     Behavior: Appropriate  Motor: Normal  Speech/Language:  Clear and Coherent and Normal Rate  Affect: Congruent and Tearful  Mood: sad  Thought process: normal  Thought content:   WNL  Sensory/Perceptual disturbances:   WNL  Orientation: oriented to person, place, time/date, and situation  Attention: Good  Concentration: Good  Memory: [place] of knowledge:  Good  Insight:   Good  Judgment:  Good  Impulse Control: Good   Subjective:   [name] participated home, via video, and consented to treatment. Therapist participated from home office.We met online due to [place] reviewed the events of the past two weeks. Mckinze noted discontinuing her Topamax, with doctor's permission, due to side-effects. Devinne  noted this medication was previously prescribed weight-loss. She noted the medication, aggravating her mood, and Lasandra noted relationship stressors with her husband, as a result. We explored this during the session and ways to manage this going forward in regards to frustration tolerance and conflict resolution. Growing up in an alcoholic household and the risks of "being wrong". She noted fighting, yelling, and "drunk behavior" in the face of being "wrong". She noted the self-imposed pressures to be "perfect" and "not be wrong".  We worked on exploring this during the session and the effects of this behavior during childhood on her cognitions including her worry regarding people's reaction to her imperfections.  Therapist praised [name] for acknowledging her feelings, engaging in mindful behavior, and identifying possible source of her worry.  Therapist encouraged  Eiko to begin to engage in mindfulness regarding this more consistently and to work on challenging her preconceived notions via the use of evidence, past history with others, and recognizing the context of her worry.  Karmyn committed to doing so between sessions and going forward.  Therapist praised [name] for her effort and energy during the session provided supportive therapy.  A follow-up was scheduled for additional treatment.  Interventions: Cognitive Behavioral Therapy and Interpersonal  Diagnosis:Manic disorder, single episode ([place])   Plan: Patient is to use CBT, ACT, BA, mindfulness, and coping skills to help manage decrease symptoms associated with their diagnosis. (Target Date 01/05/22)   Long-term goal:   Reduce overall level, frequency, and intensity of the feelings of depression and anxiety evidenced by negative self-talk, rumination, and poor self-esteem  from 6 to 7 days/week to 0 to 1 days/week per client report for at least 3 consecutive months.  Short-term goal:  Verbally express understanding of the relationship between feelings of depression, anxiety and their impact on thinking patterns and behaviors.  Verbalize an understanding of the role that distorted thinking plays in creating fears, excessive worry, and ruminations.  [name] participated in the creation of the treatment plan)  [name], LCSW

## 2021-08-05 NOTE — Telephone Encounter (Signed)
Dose inconsistent with current med list, please assess. Requested Prescriptions  Pending Prescriptions Disp Refills   losartan (COZAAR) 100 MG tablet [Pharmacy Med Name: LOSARTAN POTASSIUM 100 MG TAB] 90 tablet 1    Sig: TAKE 1/2 TABLET (50 MG TOTAL) BY MOUTH 2 (TWO) TIMES DAILY.     Cardiovascular:  Angiotensin Receptor Blockers Passed - 08/05/2021  1:37 AM      Passed - Cr in normal range and within 180 days    Creatinine, Ser  Date Value Ref Range Status  07/30/2021 0.77 0.57 - 1.00 mg/dL Final         Passed - K in normal range and within 180 days    Potassium  Date Value Ref Range Status  07/30/2021 4.4 3.5 - 5.2 mmol/L Final         Passed - Patient is not pregnant      Passed - Last BP in normal range    BP Readings from Last 1 Encounters:  06/30/21 115/80         Passed - Valid encounter within last 6 months    Recent Outpatient Visits          2 months ago Type 2 diabetes mellitus with proteinuria ([place])   [place], Jolene T, NP   3 months ago Obesity, Class III, BMI 40-49.9 (morbid obesity) ([place])   [place], Jolene T, NP   4 months ago Type 2 diabetes mellitus with morbid obesity ([place])   [place], Jolene T, NP   5 months ago Abdominal wall pain   [facility] McElwee, Lauren A, NP   6 months ago Acute pain of left shoulder   [place], Barbaraann Faster, NP      Future Appointments            In 1 week McGowan, Hunt Oris, PA-C [place]   In 1 week [place], Barbaraann Faster, NP [employer] [facility]

## 2021-08-06 ENCOUNTER — Ambulatory Visit: Payer: Self-pay

## 2021-08-12 ENCOUNTER — Other Ambulatory Visit: Payer: Self-pay

## 2021-08-12 ENCOUNTER — Ambulatory Visit (INDEPENDENT_AMBULATORY_CARE_PROVIDER_SITE_OTHER): Payer: 59 | Admitting: Bariatrics

## 2021-08-12 ENCOUNTER — Ambulatory Visit
Admission: RE | Admit: 2021-08-12 | Discharge: 2021-08-12 | Disposition: A | Discharge status: Home / Self Care | Payer: Commercial Managed Care - HMO | Source: Ambulatory Visit | Attending: Urology | Admitting: Urology

## 2021-08-12 DIAGNOSIS — D1771 Benign lipomatous neoplasm of kidney: Secondary | ICD-10-CM | POA: Diagnosis present

## 2021-08-14 ENCOUNTER — Ambulatory Visit: Payer: 59 | Admitting: Nurse Practitioner

## 2021-08-14 NOTE — Progress Notes (Signed)
08/17/21 9:09 AM   Lori Knight 13-Mar-1971 [id number]  Referring provider:  [name], NP [address] [place],  [place] [address] Chief Complaint  Patient presents with   Follow-up    1 year follow-up    Urological history: Intermediate risk hematuria  - Former smoker  - CTU 03/2018 negative  - Cystoscopy in 04/2018 with Dr. Erlene Quan noted a small 1 cm round papillary tumor at left dome of bladder highly concerning for bladder cancer - S/p TURBT in 04/2018; lesions was negative for malignancy consistent with cystitis cystica with focal calcification.   2. Bilateral angiomyolipomas  - Found on 03/2018 CTU: 3.7 cm right lower pole angiomyolipoma and 6 mm left kidney angiomyolipoma. - RUS 07/2020 No significant interval change in the known bilateral renal angiomyolipoma.  No hydronephrosis or other acute finding. - RUS 08/12/2021 visualized stable bilateral renal angiomyolipomas  3. Mixed incontinence  - Completed 12 sessions of PTNS.   4. Nocturia  - Sleeping with CPAP     HPI: Lori Knight is a 51 y.o.female who presents today for a 1 year follow-up for RUS report and UA.   RUS on 08/12/2021 visualized stable bilateral renal angiomyolipomas.   0-3 times per day she worries she will not make it to the bathroom in time to void. 4-7 times a day she uses the bathroom. She limits her fluid intake 0-4 times per week  she engages in toilet mapping. She's had 0-3 episodes of urinary incontinence this week, and 0-3 episodes of nocturia.   She reports today that she has stopped drinking as much tea and has started using her CPAP now regularly which helps with her nocturia. She has been increasing her water intake. She has been symptomatically improving.   She denies any modifying or aggravating factors, denies any gross hematuria, dysuria or suprapubic/flank pain.  She denies any fevers, chills, nausea or vomiting.    PMH: Past Medical History:  Diagnosis Date   Adult  hypothyroidism 06/21/2012   Anxiety    Back pain    Benign paroxysmal positional nystagmus    Bilateral polycystic ovarian syndrome 06/21/2012   Biliary calculus with cholecystitis 08/14/2012   Bipolar 1 disorder (HCC)    BP (high blood pressure) 02/14/2014   Bronchitis    Constipation    COVID-19 07/2020   Depression    Diabetes mellitus ([place]) 08/14/2012   Diabetes mellitus without complication ([facility])    pre-diabetic   Dysrhythmia    wore heart monitor 2016. Corrected by changing Levothyroxine dose.   Edema of both lower extremities    Gallbladder problem    GERD (gastroesophageal reflux disease)    Heart murmur    followed by PCP-AS A CHILD-ASYMPTOMATIC   Hyperlipidemia    Hyperprolactinemia ([facility]) 06/21/2012   Hypertension    Hypothyroid    Hypoxia 04/17/2018   Joint pain    Kidney problem    Meningioma (HCC)    Motion sickness    carnival rides   Neuropathy    PONV (postoperative nausea and vomiting)    Low BP after sinus surgery. WAKES UP CRYING   Shortness of breath dyspnea    stairs. related to wt.   Sleep apnea    has CPAP. has not used since 2011   Swallowing difficulty    Vertigo 2016   none recently   Vitamin D deficiency     Surgical History: Past Surgical History:  Procedure Laterality Date   CHOLECYSTECTOMY     COLONOSCOPY WITH  PROPOFOL N/A 06/19/2015   Procedure: COLONOSCOPY WITH PROPOFOL;  Surgeon: [name], MD;  Location: [facility];  Service: Endoscopy;  Laterality: N/A;  Diabetic - oral meds    DENTAL SURGERY     ESOPHAGOGASTRODUODENOSCOPY (EGD) WITH PROPOFOL N/A 01/16/2021   Procedure: ESOPHAGOGASTRODUODENOSCOPY (EGD) WITH PROPOFOL;  Surgeon: [name], MD;  Location: [facility];  Service: Endoscopy;  Laterality: N/A;  Diabetic - oral meds   ETHMOIDECTOMY Bilateral 04/17/2018   Procedure: ETHMOIDECTOMY;  Surgeon: [name], MD;  Location: [facility];  Service: ENT;  Laterality: Bilateral;   GALLBLADDER SURGERY      IMAGE GUIDED SINUS SURGERY N/A 04/17/2018   Procedure: IMAGE GUIDED SINUS SURGERY;  Surgeon: [name], MD;  Location: [facility];  Service: ENT;  Laterality: N/A;   MAXILLARY ANTROSTOMY Bilateral 04/17/2018   Procedure: MAXILLARY ANTROSTOMY;  Surgeon: [name], MD;  Location: [facility];  Service: ENT;  Laterality: Bilateral;   NASAL SEPTOPLASTY W/ TURBINOPLASTY Bilateral 04/17/2018   Procedure: NASAL SEPTOPLASTY WITH TURBINATE REDUCTION;  Surgeon: [name], MD;  Location: [facility];  Service: ENT;  Laterality: Bilateral;   TOTAL ABDOMINAL HYSTERECTOMY  2012   cervical dysplasia/ovaries remian   TRANSURETHRAL RESECTION OF BLADDER TUMOR N/A 04/17/2018   Procedure: NTRANSURETHRAL RESECTION OF BLADDER TUMOR (TURBT) WITH GEMCITABINE;  Surgeon: [name], MD;  Location: [facility];  Service: Urology;  Laterality: N/A;    Home Medications:  Allergies as of 08/17/2021       Reactions   Hctz [hydrochlorothiazide] Cough   Lisinopril Cough   Latex Rash   Condoms only   Prednisone Anxiety   Paranoia        Medication List        Accurate as of August 17, 2021  9:09 AM. If you have any questions, ask your nurse or doctor.          STOP taking these medications    topiramate 50 MG tablet Commonly known as: Topamax Stopped by: Zara Council, PA-C       TAKE these medications    acetaminophen 500 MG tablet Commonly known as: TYLENOL Take 500 mg by mouth every 6 (six) hours as needed.   amLODipine 5 MG tablet Commonly known as: NORVASC Take 1 tablet (5 mg total) by mouth daily.   atorvastatin 20 MG tablet Commonly known as: LIPITOR Take 1 tablet (20 mg total) by mouth daily.   Cholecalciferol 125 MCG (5000 UT) capsule Take 1 capsule (5,000 Units total) by mouth daily.   Contour Next Test test strip Generic drug: glucose blood USE TO CHECK BS UP TO 3 TIMES DAILY FOR DIABETES DX: E11.9   Fish Oil 1200 MG Caps Take 1,200 mg by mouth daily.    lamoTRIgine 150 MG tablet Commonly known as: LAMICTAL Take 300 mg by mouth daily.   levothyroxine 150 MCG tablet Commonly known as: SYNTHROID Take 1 tablet (150 mcg total) by mouth daily.   losartan 50 MG tablet Commonly known as: Cozaar Take 2 tablets (100 mg total) by mouth daily.   metFORMIN 500 MG tablet Commonly known as: GLUCOPHAGE Take 1,000 mg by mouth 2 (two) times daily.   mometasone 0.1 % cream Commonly known as: ELOCON SMARTSIG:1 Application Topical   omeprazole 40 MG capsule Commonly known as: PRILOSEC TAKE 1 CAPSULE BY MOUTH EVERY DAY   ondansetron 4 MG disintegrating tablet Commonly known as: Zofran ODT Take 1 tablet (4 mg total) by mouth every 8 (eight) hours as needed for nausea or vomiting.  Trulicity 3 IE/3.3IR Sopn Generic drug: Dulaglutide Inject 3 mg as directed once a week.   vitamin C 1000 MG tablet Take 1,000 mg by mouth daily.        Allergies:  Allergies  Allergen Reactions   Hctz [Hydrochlorothiazide] Cough   Lisinopril Cough   Latex Rash    Condoms only   Prednisone Anxiety    Paranoia    Family History: Family History  Problem Relation Age of Onset   Diabetes Maternal Grandmother    Hypertension Maternal Grandmother    Hyperlipidemia Maternal Grandmother    Hypothyroidism Maternal Grandmother    Heart disease Maternal Grandmother    Diabetes Mother    Hypertension Mother    Hypothyroidism Mother    Bipolar disorder Mother    Heart disease Mother    Kidney disease Mother    Thyroid disease Mother    Depression Mother    Anxiety disorder Mother    Drug abuse Mother    Obesity Mother    Alcohol abuse Father    Depression Daughter    Breast cancer Neg Hx     Social History:  reports that she quit smoking about 19 years ago. Her smoking use included cigarettes. She has a 10.00 pack-year smoking history. She has never used smokeless tobacco. She reports current alcohol use. She reports that she does not use  drugs.   Physical Exam: BP 129/86    Pulse 85    Ht '5\' 6"'  (1.676 m)    Wt 275 lb (124.7 kg)    BMI 44.39 kg/m   Constitutional:  Alert and oriented, No acute distress. HEENT: [place] AT, mask in place.  Trachea midline Cardiovascular: No clubbing, cyanosis, or edema. Respiratory: Normal respiratory effort, no increased work of breathing. Neurologic: Grossly intact, no focal deficits, moving all 4 extremities. Psychiatric: Normal mood and affect.  Laboratory Data: Lab Results  Component Value Date   CREATININE 0.77 07/30/2021   Lab Results  Component Value Date   HGBA1C 6.4 (H) 05/15/2021   Component     Latest Ref Rng & Units 07/30/2021  Glucose     70 - 99 mg/dL 104 (H)  BUN     6 - 24 mg/dL 14  Creatinine     0.57 - 1.00 mg/dL 0.77  eGFR     >59 mL/min/1.73 93  BUN/Creatinine Ratio     9 - 23 18  Sodium     134 - 144 mmol/L 145 (H)  Potassium     3.5 - 5.2 mmol/L 4.4  Chloride     96 - 106 mmol/L 109 (H)  CO2     20 - 29 mmol/L 21  Calcium     8.7 - 10.2 mg/dL 9.7   Component     Latest Ref Rng & Units 05/15/2021  WBC     3.4 - 10.8 x10E3/uL 8.6  RBC     3.77 - 5.28 x10E6/uL 5.32 (H)  Hemoglobin     11.1 - 15.9 g/dL 14.4  HCT     34.0 - 46.6 % 44.5  MCV     79 - 97 fL 84  MCH     26.6 - 33.0 pg 27.1  MCHC     31.5 - 35.7 g/dL 32.4  RDW     11.7 - 15.4 % 14.8  Platelets     150 - 450 x10E3/uL 302  nRBC     0.0 - 0.2 %   Neutrophils  Not Estab. % 55  NEUT#     1.4 - 7.0 x10E3/uL 4.8  Lymphocytes     %   Lymphocyte #     0.7 - 3.1 x10E3/uL 3.0  Monocytes Relative     %   Monocyte #     0.1 - 1.0 K/uL   Eosinophil     %   Eosinophils Absolute     0.0 - 0.5 K/uL   Basophil     %   Basophils Absolute     0.0 - 0.2 x10E3/uL 0.0  Immature Granulocytes     Not Estab. % 1  Abs Immature Granulocytes     0.00 - 0.07 K/uL   Lymphs     Not Estab. % 35  Monocytes     Not Estab. % 7  Eos     Not Estab. % 1  Basos     Not Estab. % 1   Monocytes Absolute     0.1 - 0.9 x10E3/uL 0.6  EOS (ABSOLUTE)     0.0 - 0.4 x10E3/uL 0.1  Immature Grans (Abs)     0.0 - 0.1 x10E3/uL 0.1   Component     Latest Ref Rng & Units 05/15/2021          TSH     0.450 - 4.500 uIU/mL 0.917   Urinalysis 6-10 squamous epithelial cells, 6-10 WBCs, 0-5 RBCs, and few bacteria   Pertinent Imaging: CLINICAL DATA:  Follow-up renal angiomyolipoma   EXAM: RENAL / URINARY TRACT ULTRASOUND COMPLETE   COMPARISON:  Renal ultrasound 07/31/2020   FINDINGS: Right Kidney:   Renal measurements: 12.3 x 5.2 x 6.2 cm = volume: 208 mL. Echogenicity within normal limits. Redemonstrated hyperechoic mass in the interpolar region measures 4.0 x 3.2 x 3.6 cm, previously measuring 4.0 x 3.6 x 3.0 cm. No hydronephrosis.   Left Kidney:   Renal measurements: 13.3 x 6.2 x 5.7 cm = volume: 245 mL. Echogenicity within normal limits. Redemonstrated hyperechoic mass in the lower pole measures 0.8 x 0.8 x 0.8 cm, previously measuring 0.8 x 0.8 x 0.7 cm. No hydronephrosis.   Bladder:   Appears normal for degree of bladder distention.   Other:   None.   IMPRESSION: Stable bilateral renal angiomyolipomas.     Electronically Signed   By: Audie Pinto M.D.   On: 08/12/2021 14:15   I have personally reviewed the images and agree with radiologist interpretation.    Assessment & Plan:   Bilateral angiomyolipomas  - Stable - RUS reviewed today  -reviewed red flag signs   2. Intermediate risk hematuria  - Hematuria work-up completed in 04/2018.  - Findings positive for nodular area of cystitis cystica.  - UA is negative for microscopic hematuria  3. Mixed incontinence  - She has had symptomatic improvement  - She is not interested in any medications at this time   4. Nocturia  - Sleeping with CPAP machine  Return in about 1 year (around 08/17/2022) for RUS, UA, OAB questionnaire and PVR .  [facility] [address] [place], [place] [address] [phone]  I,Kailey Littlejohn,acting as a scribe for [facility], PA-C.,have documented all relevant documentation on the behalf of Aliciana Ricciardi, PA-C,as directed by  [facility], PA-C while in the presence of [place], PA-C.

## 2021-08-14 NOTE — Progress Notes (Signed)
Error

## 2021-08-15 NOTE — Patient Instructions (Signed)

## 2021-08-17 ENCOUNTER — Other Ambulatory Visit: Payer: Self-pay

## 2021-08-17 ENCOUNTER — Ambulatory Visit: Payer: Managed Care, Other (non HMO) | Admitting: Urology

## 2021-08-17 ENCOUNTER — Other Ambulatory Visit
Admission: RE | Admit: 2021-08-17 | Discharge: 2021-08-17 | Disposition: A | Discharge status: Home / Self Care | Payer: Commercial Managed Care - HMO | Attending: Urology | Admitting: Urology

## 2021-08-17 ENCOUNTER — Encounter: Payer: Self-pay | Admitting: Urology

## 2021-08-17 ENCOUNTER — Other Ambulatory Visit: Payer: Self-pay | Admitting: *Deleted

## 2021-08-17 VITALS — BP 129/86 | HR 85 | Ht 66.0 in | Wt 275.0 lb

## 2021-08-17 DIAGNOSIS — D1771 Benign lipomatous neoplasm of kidney: Secondary | ICD-10-CM | POA: Diagnosis not present

## 2021-08-17 DIAGNOSIS — R351 Nocturia: Secondary | ICD-10-CM

## 2021-08-17 DIAGNOSIS — N3946 Mixed incontinence: Secondary | ICD-10-CM | POA: Diagnosis not present

## 2021-08-17 DIAGNOSIS — R319 Hematuria, unspecified: Secondary | ICD-10-CM

## 2021-08-17 LAB — URINALYSIS, COMPLETE (UACMP) WITH MICROSCOPIC
Bilirubin Urine: NEGATIVE
Glucose, UA: NEGATIVE mg/dL
Hgb urine dipstick: NEGATIVE
Ketones, ur: NEGATIVE mg/dL
Leukocytes,Ua: NEGATIVE
Nitrite: NEGATIVE
Specific Gravity, Urine: 1.015 (ref 1.005–1.030)
pH: 5.5 (ref 5.0–8.0)

## 2021-08-18 ENCOUNTER — Encounter: Payer: Self-pay | Admitting: Nurse Practitioner

## 2021-08-18 ENCOUNTER — Ambulatory Visit (INDEPENDENT_AMBULATORY_CARE_PROVIDER_SITE_OTHER): Payer: Managed Care, Other (non HMO) | Admitting: Nurse Practitioner

## 2021-08-18 VITALS — BP 124/80 | HR 74 | Temp 98.3°F | Ht 66.0 in | Wt 278.0 lb

## 2021-08-18 DIAGNOSIS — E039 Hypothyroidism, unspecified: Secondary | ICD-10-CM

## 2021-08-18 DIAGNOSIS — R809 Proteinuria, unspecified: Secondary | ICD-10-CM

## 2021-08-18 DIAGNOSIS — D329 Benign neoplasm of meninges, unspecified: Secondary | ICD-10-CM | POA: Diagnosis not present

## 2021-08-18 DIAGNOSIS — E1129 Type 2 diabetes mellitus with other diabetic kidney complication: Secondary | ICD-10-CM | POA: Diagnosis not present

## 2021-08-18 DIAGNOSIS — R0981 Nasal congestion: Secondary | ICD-10-CM | POA: Insufficient documentation

## 2021-08-18 DIAGNOSIS — K219 Gastro-esophageal reflux disease without esophagitis: Secondary | ICD-10-CM

## 2021-08-18 DIAGNOSIS — F309 Manic episode, unspecified: Secondary | ICD-10-CM

## 2021-08-18 DIAGNOSIS — E785 Hyperlipidemia, unspecified: Secondary | ICD-10-CM

## 2021-08-18 DIAGNOSIS — Z9989 Dependence on other enabling machines and devices: Secondary | ICD-10-CM

## 2021-08-18 DIAGNOSIS — E1169 Type 2 diabetes mellitus with other specified complication: Secondary | ICD-10-CM | POA: Diagnosis not present

## 2021-08-18 DIAGNOSIS — I152 Hypertension secondary to endocrine disorders: Secondary | ICD-10-CM

## 2021-08-18 DIAGNOSIS — E1159 Type 2 diabetes mellitus with other circulatory complications: Secondary | ICD-10-CM | POA: Diagnosis not present

## 2021-08-18 DIAGNOSIS — G4733 Obstructive sleep apnea (adult) (pediatric): Secondary | ICD-10-CM

## 2021-08-18 DIAGNOSIS — I700 Atherosclerosis of aorta: Secondary | ICD-10-CM

## 2021-08-18 LAB — BAYER DCA HB A1C WAIVED: HB A1C (BAYER DCA - WAIVED): 6.3 % — ABNORMAL HIGH (ref 4.8–5.6)

## 2021-08-18 LAB — MICROALBUMIN, URINE WAIVED
Creatinine, Urine Waived: 50 mg/dL (ref 10–300)
Microalb, Ur Waived: 10 mg/L (ref 0–19)

## 2021-08-18 LAB — VERITOR FLU A/B WAIVED
Influenza A: NEGATIVE
Influenza B: NEGATIVE

## 2021-08-18 NOTE — Assessment & Plan Note (Signed)
Noted on imaging 03/17/2018.  Recommend continued use of statin daily and consider addition of ASA in future for prevention.

## 2021-08-18 NOTE — Progress Notes (Signed)
BP 124/80 (BP Location: Left Arm)    Pulse 74    Temp 98.3 F (36.8 C) (Oral)    Ht 5\' 6"  (1.676 m)    Wt 278 lb (126.1 kg)    SpO2 97%    BMI 44.87 kg/m    Subjective:    Patient ID: Lori Knight, female    DOB: 12/06/1970, 51 y.o.   MRN: [id number]  HPI: Lori Knight is a 51 y.o. female  Chief Complaint  Patient presents with   Diabetes   Hyperlipidemia   Hypertension   Hypothyroidism   Mood   Breathing Problem    Patient states she is having issues with breathing states when she takes a deep breath she has a burning sensation and she has a history of acid reflux and she says she recently within the past couple of months had an upper endoscopy. Patient states the best way to describes it as if cold air is hitting her lungs but it is only located in the sternum area. Patient denies having any shortness of breath.    Sore Throat   Headache   Generalized Body Aches   UPPER RESPIRATORY TRACT INFECTION Symptoms started this morning at 11 am with sore throat, headache, burning sensation with deep breaths, body aches. Worst symptom: body aches Fever: no Cough: no Shortness of breath: no Wheezing: no Chest pain: no Chest tightness: yes -- burning sensation Chest congestion: no Nasal congestion: yes Runny nose: no Post nasal drip: no Sneezing: no Sore throat: yes Swollen glands: no Sinus pressure: no Headache: yes Face pain: no Toothache: no Ear pain: none Ear pressure: none Eyes red/itching:no Eye drainage/crusting: no  Vomiting: no Rash: no Fatigue: yes Sick contacts: yes -- was at [employer] on weekend Strep contacts: no  Context: fluctuating Recurrent sinusitis: no Relief with OTC cold/cough medications: no  Treatments attempted: none   DIABETES Followed by endocrinology and last saw 02/03/21.  A1c in November 6.4%.  Continues on Metformin 9242 MG BID and Trulicity 3 MG weekly. Her endo provider NP [place] left practice and she has not been satisfied with  current provider, will be staying with PCP at this time.   Her goal is weight loss, she is currently working with weight management in [place] and last saw 06/30/21. Hypoglycemic episodes:no Polydipsia/polyuria: occasional Visual disturbance: no Chest pain: no Paresthesias: no Glucose Monitoring: no             Accucheck frequency: BID             Fasting glucose: 150-160, drop to 120 range in morning without anything             Post prandial: in green after meals -- <180             Evening:             Before meals: Taking Insulin?: no             Long acting insulin:             Short acting insulin: Blood Pressure Monitoring: not checking Retinal Examination: Up to Date Foot Exam: Up to Date Diabetic Education: Completed Pneumovax: Up to Date Influenza: Up to Date Aspirin: no    HYPOTHYROIDISM Continues on Levothyroxine 150 MCG daily. Thyroid control status:stable Satisfied with current treatment? yes Medication side effects: no Medication compliance: good compliance Etiology of hypothyroidism:  Recent dose adjustment: yes Fatigue: no Cold intolerance: no Heat intolerance: no Weight gain: no Weight  loss: no Constipation: occasional Diarrhea/loose stools: no Palpitations: no Lower extremity edema: no Anxiety/depressed mood: no   HYPERTENSION / HYPERLIPIDEMIA Continues on Losartan 100 MG, Amlodipine 5 MG and Atorvastatin 20 MG QHS. Her last echo in 2019 noted EF 50-55%.     Sees neurosurgery, Dr. Lacinda Axon, for follow-up meningioma which remains stable.  She is returning to him next year -- every 24 month follow up visits. Duration of hypertension: chronic BP monitoring frequency: not checking BP range:  BP medication side effects: no Duration of hyperlipidemia: chronic Aspirin: no Recent stressors: no Recurrent headaches: no Visual changes: no Palpitations: no Dyspnea: no Chest pain: no Lower extremity edema: no Dizzy/lightheaded: no  The 10-year ASCVD  risk score (Arnett DK, et al., 2019) is: 4.2%   Values used to calculate the score:     Age: 28 years     Sex: Female     Is Non-Hispanic African American: No     Diabetic: Yes     Tobacco smoker: No     Systolic Blood Pressure: 672 mmHg     Is BP treated: Yes     HDL Cholesterol: 35 mg/dL     Total Cholesterol: 158 mg/dL  GERD Continues on Omeprazole for GERD. GERD control status: stable Satisfied with current treatment? yes Heartburn frequency: none Medication side effects: no  Medication compliance: stable Dysphagia: no Odynophagia:  no Hematemesis: no Blood in stool: no EGD: yes  BIPOLAR DISORDER Currently taking Lamictal 150 MG BID.  She is coming off Topamax right now, which was ordered by weight management. Mood status: stable Satisfied with current treatment?: yes Symptom severity: moderate  Duration of current treatment : chronic Side effects: no Medication compliance: good compliance Psychotherapy/counseling: at present -- with Alene Mires Depressed mood: none Anxious mood: improving with stopping Topamax Anhedonia: no Significant weight loss or gain: no Insomnia: none Fatigue: yes Feelings of worthlessness or guilt: no Impaired concentration/indecisiveness: yes Suicidal ideations: no Hopelessness: no Crying spells: yes Depression screen [facility] 2/9 08/18/2021 05/15/2021 11/05/2020 09/11/2020 02/20/2020  Decreased Interest 0 1 2 3  0  Down, Depressed, Hopeless 0 1 2 3  0  PHQ - 2 Score 0 2 4 6  0  Altered sleeping 0 0 2 3 0  Tired, decreased energy 1 1 2 3 1   Change in appetite 1 1 2 3 1   Feeling bad or failure about yourself  0 0 2 3 0  Trouble concentrating 1 2 1 3  0  Moving slowly or fidgety/restless 1 1 1 3  0  Suicidal thoughts 0 0 0 0 0  PHQ-9 Score 4 7 14 24 2   Difficult doing work/chores - Not difficult at all Somewhat difficult - Not difficult at all  Some recent data might be hidden     Relevant past medical, surgical, family and social history reviewed and  updated as indicated. Interim medical history since our last visit reviewed. Allergies and medications reviewed and updated.  Review of Systems  Constitutional:  Negative for activity change, appetite change, diaphoresis, fatigue and fever.  Respiratory:  Negative for cough, chest tightness and shortness of breath.   Cardiovascular:  Negative for chest pain, palpitations and leg swelling.  Gastrointestinal: Negative.   Endocrine: Negative for cold intolerance, heat intolerance, polydipsia, polyphagia and polyuria.  Neurological: Negative.   Psychiatric/Behavioral: Negative.     Per HPI unless specifically indicated above     Objective:    BP 124/80 (BP Location: Left Arm)    Pulse 74    Temp 98.3  F (36.8 C) (Oral)    Ht 5\' 6"  (1.676 m)    Wt 278 lb (126.1 kg)    SpO2 97%    BMI 44.87 kg/m   Wt Readings from Last 3 Encounters:  08/18/21 278 lb (126.1 kg)  08/17/21 275 lb (124.7 kg)  06/30/21 273 lb (123.8 kg)    Physical Exam Vitals and nursing note reviewed.  Constitutional:      General: She is awake. She is not in acute distress.    Appearance: She is well-developed and well-groomed. She is morbidly obese. She is not ill-appearing.  HENT:     Head: Normocephalic.     Right Ear: Hearing normal.     Left Ear: Hearing normal.  Eyes:     General: Lids are normal.        Right eye: No discharge.        Left eye: No discharge.     Conjunctiva/sclera: Conjunctivae normal.     Pupils: Pupils are equal, round, and reactive to light.  Neck:     Thyroid: No thyromegaly.     Vascular: No carotid bruit.  Cardiovascular:     Rate and Rhythm: Normal rate and regular rhythm.     Heart sounds: Normal heart sounds. No murmur heard.   No gallop.  Pulmonary:     Effort: Pulmonary effort is normal. No accessory muscle usage or respiratory distress.     Breath sounds: Normal breath sounds.  Abdominal:     General: Bowel sounds are normal.     Palpations: Abdomen is soft.   Musculoskeletal:     Cervical back: Normal range of motion and neck supple.     Right lower leg: No edema.     Left lower leg: No edema.  Lymphadenopathy:     Cervical: No cervical adenopathy.  Skin:    General: Skin is warm and dry.  Neurological:     Mental Status: She is alert and oriented to person, place, and time.  Psychiatric:        Attention and Perception: Attention normal.        Mood and Affect: Mood normal.        Speech: Speech normal.        Behavior: Behavior normal. Behavior is cooperative.        Thought Content: Thought content normal.   Results for orders placed or performed in visit on 08/18/21  Bayer DCA Hb A1c Waived  Result Value Ref Range   HB A1C (BAYER DCA - WAIVED) 6.3 (H) 4.8 - 5.6 %  Microalbumin, Urine Waived  Result Value Ref Range   Microalb, Ur Waived 10 0 - 19 mg/L   Creatinine, Urine Waived 50 10 - 300 mg/dL   Microalb/Creat Ratio 30-300 (H) <30 mg/g      Assessment & Plan:   Problem List Items Addressed This Visit       Cardiovascular and Mediastinum   Aortic atherosclerosis ([place])    Noted on imaging 03/17/2018.  Recommend continued use of statin daily and consider addition of ASA in future for prevention.      Relevant Orders   Comprehensive metabolic panel   Lipid Panel w/o Chol/HDL Ratio   Hypertension associated with diabetes ([place])    Chronic, ongoing with BP at goal today in office with recheck.  Continue current medication regimen and adjust as needed.  Focus on DASH diet and regular exercise at home.  Recommend she check BP at few days a week  at home and document for provider.  Continue to collaborate with weight management. Losartan for kidney protection.  Return in 3 months.      Relevant Orders   Bayer DCA Hb A1c Waived (Completed)   Microalbumin, Urine Waived (Completed)   CBC with Differential/Platelet   Comprehensive metabolic panel     Respiratory   OSA on CPAP    Recommend 100% use of CPAP for overall health.         Digestive   Gastroesophageal reflux disease without esophagitis    Chronic, ongoing.  Continue Omeprazole 40 MG daily and continue to collaborate with GI.  Mag level today.      Relevant Orders   Magnesium     Endocrine   Adult hypothyroidism    Chronic, ongoing.  Continue Levothyroxine 150 MCG daily.  Educated her on this.        Hyperlipidemia associated with type 2 diabetes mellitus (HCC)    Chronic, ongoing.  Continue on statin which is offering benefit.  Lipid panel today.  Return in 3 months.      Relevant Orders   Bayer DCA Hb A1c Waived (Completed)   Comprehensive metabolic panel   Lipid Panel w/o Chol/HDL Ratio   Type 2 diabetes mellitus with morbid obesity (HCC)    Chronic, ongoing with A1c 6.3% today and urine ALB 10 (February 2023).  Continue Losartan for kidney protection.  Continue current medication regimen as prescribed by endocrinology and adjust as needed.  Educated on BS goals in morning and 2 hours after meals.  Return in 3 months.        Relevant Orders   Bayer DCA Hb A1c Waived (Completed)   Microalbumin, Urine Waived (Completed)   Type 2 diabetes mellitus with proteinuria (HCC) - Primary    Chronic, ongoing with A1c 6.3% today and urine ALB 10 (February 2023).  Continue Losartan for kidney protection.  Continue current medication regimen as prescribed by endocrinology and adjust as needed.  Educated on BS goals in morning and 2 hours after meals.  Could consider reduction of Metformin if continues improving A1c.  Return in 3 months.        Relevant Orders   Bayer DCA Hb A1c Waived (Completed)   Microalbumin, Urine Waived (Completed)     Nervous and Auditory   Meningioma ([place])    Followed by neurology, continue collaboration with neurology team and recent notes reviewed.        Other   Bipolar I disorder, single manic episode (HCC)    Chronic, stable.  Denies SI/HI.  Followed by psychiatry, will continue this collaboration and current  medication regimen as prescribed by them.  Return in 3 months.      Nasal congestion    Starting this morning.  Will check Covid and flu testing today.  Recommend: - Increased rest - Increasing Fluids - Acetaminophen as needed for fever/pain.  - Salt water gargling, chloraseptic spray and throat lozenges - Diabetic Tussin and Coricidin - Humidifying the air. Return to office as needed for worsening or ongoing symptoms.         Relevant Orders   Novel Coronavirus, NAA ([facility])   Veritor Flu A/B Waived   Obesity, Class III, BMI 40-49.9 (morbid obesity) ([place])    Continue collaboration with weight management in [place], which she finds value with.  Recommended eating smaller high protein, low fat meals more frequently and exercising 30 mins a day 5 times a week with a goal of 10-15lb weight  loss in the next 3 months. Patient voiced their understanding and motivation to adhere to these recommendations.         Follow up plan: Return in about 3 months (around 11/15/2021) for T2DM, HTN/HLD, MOOD, GERD, THYROID.

## 2021-08-18 NOTE — Assessment & Plan Note (Signed)
Chronic, stable.  Denies SI/HI.  Followed by psychiatry, will continue this collaboration and current medication regimen as prescribed by them.  Return in 3 months. 

## 2021-08-18 NOTE — Assessment & Plan Note (Signed)
Continue collaboration with weight management in [place], which she finds value with.  Recommended eating smaller high protein, low fat meals more frequently and exercising 30 mins a day 5 times a week with a goal of 10-15lb weight loss in the next 3 months. Patient voiced their understanding and motivation to adhere to these recommendations.

## 2021-08-18 NOTE — Assessment & Plan Note (Signed)
Followed by neurology, continue collaboration with neurology team and recent notes reviewed. 

## 2021-08-18 NOTE — Assessment & Plan Note (Addendum)
Chronic, ongoing.  Continue on statin which is offering benefit.  Lipid panel today.  Return in 3 months. 

## 2021-08-18 NOTE — Assessment & Plan Note (Signed)
Recommend 100% use of CPAP for overall health.

## 2021-08-18 NOTE — Assessment & Plan Note (Signed)
Chronic, ongoing with A1c 6.3% today and urine ALB 10 (February 2023).  Continue Losartan for kidney protection.  Continue current medication regimen as prescribed by endocrinology and adjust as needed.  Educated on BS goals in morning and 2 hours after meals.  Return in 3 months.

## 2021-08-18 NOTE — Assessment & Plan Note (Addendum)
Chronic, ongoing with A1c 6.3% today and urine ALB 10 (February 2023).  Continue Losartan for kidney protection.  Continue current medication regimen as prescribed by endocrinology and adjust as needed.  Educated on BS goals in morning and 2 hours after meals.  Could consider reduction of Metformin if continues improving A1c.  Return in 3 months.

## 2021-08-18 NOTE — Progress Notes (Signed)
Contacted via MyChart   Flu testing is negative:)

## 2021-08-18 NOTE — Assessment & Plan Note (Signed)
Chronic, ongoing with BP at goal today in office with recheck.  Continue current medication regimen and adjust as needed.  Focus on DASH diet and regular exercise at home.  Recommend she check BP at few days a week at home and document for provider.  Continue to collaborate with weight management. Losartan for kidney protection.  Return in 3 months.

## 2021-08-18 NOTE — Assessment & Plan Note (Signed)
Chronic, ongoing.  Continue Omeprazole 40 MG daily and continue to collaborate with GI.  Mag level today.

## 2021-08-18 NOTE — Assessment & Plan Note (Signed)
Starting this morning.  Will check Covid and flu testing today.  Recommend: - Increased rest - Increasing Fluids - Acetaminophen as needed for fever/pain.  - Salt water gargling, chloraseptic spray and throat lozenges - Diabetic Tussin and Coricidin - Humidifying the air. Return to office as needed for worsening or ongoing symptoms.

## 2021-08-18 NOTE — Assessment & Plan Note (Signed)
Chronic, ongoing.  Continue Levothyroxine 150 MCG daily.  Educated her on this.

## 2021-08-19 LAB — LIPID PANEL W/O CHOL/HDL RATIO
Cholesterol, Total: 157 mg/dL (ref 100–199)
HDL: 34 mg/dL — ABNORMAL LOW (ref >39)
LDL Chol Calc (NIH): 77 mg/dL (ref 0–99)
Triglycerides: 282 mg/dL — ABNORMAL HIGH (ref 0–149)
VLDL Cholesterol Cal: 46 mg/dL — ABNORMAL HIGH (ref 5–40)

## 2021-08-19 LAB — COMPREHENSIVE METABOLIC PANEL WITH GFR
ALT: 26 IU/L (ref 0–32)
AST: 16 IU/L (ref 0–40)
Albumin/Globulin Ratio: 2 (ref 1.2–2.2)
Albumin: 4.3 g/dL (ref 3.8–4.9)
Alkaline Phosphatase: 139 IU/L — ABNORMAL HIGH (ref 44–121)
BUN/Creatinine Ratio: 12 (ref 9–23)
BUN: 10 mg/dL (ref 6–24)
Bilirubin Total: <0.2 mg/dL (ref 0.0–1.2)
CO2: 24 mmol/L (ref 20–29)
Calcium: 9.1 mg/dL (ref 8.7–10.2)
Chloride: 107 mmol/L — ABNORMAL HIGH (ref 96–106)
Creatinine, Ser: 0.85 mg/dL (ref 0.57–1.00)
Globulin, Total: 2.1 g/dL (ref 1.5–4.5)
Glucose: 116 mg/dL — ABNORMAL HIGH (ref 70–99)
Potassium: 4.3 mmol/L (ref 3.5–5.2)
Sodium: 146 mmol/L — ABNORMAL HIGH (ref 134–144)
Total Protein: 6.4 g/dL (ref 6.0–8.5)
eGFR: 83 mL/min/1.73

## 2021-08-19 LAB — CBC WITH DIFFERENTIAL/PLATELET
Basophils Absolute: 0.1 x10E3/uL (ref 0.0–0.2)
Basos: 1 %
EOS (ABSOLUTE): 0.2 x10E3/uL (ref 0.0–0.4)
Eos: 2 %
Hematocrit: 41.2 % (ref 34.0–46.6)
Hemoglobin: 13.7 g/dL (ref 11.1–15.9)
Immature Grans (Abs): 0.1 x10E3/uL (ref 0.0–0.1)
Immature Granulocytes: 1 %
Lymphocytes Absolute: 3.3 x10E3/uL — ABNORMAL HIGH (ref 0.7–3.1)
Lymphs: 33 %
MCH: 27.8 pg (ref 26.6–33.0)
MCHC: 33.3 g/dL (ref 31.5–35.7)
MCV: 84 fL (ref 79–97)
Monocytes Absolute: 0.7 x10E3/uL (ref 0.1–0.9)
Monocytes: 7 %
Neutrophils Absolute: 5.8 x10E3/uL (ref 1.4–7.0)
Neutrophils: 56 %
Platelets: 321 x10E3/uL (ref 150–450)
RBC: 4.92 x10E6/uL (ref 3.77–5.28)
RDW: 14.1 % (ref 11.7–15.4)
WBC: 10.1 x10E3/uL (ref 3.4–10.8)

## 2021-08-19 LAB — NOVEL CORONAVIRUS, NAA: SARS-CoV-2, NAA: NOT DETECTED

## 2021-08-19 LAB — MAGNESIUM: Magnesium: 1.8 mg/dL (ref 1.6–2.3)

## 2021-08-19 LAB — SARS-COV-2, NAA 2 DAY TAT

## 2021-08-19 NOTE — Progress Notes (Signed)
Contacted via MyChart   Covid testing is negative!!

## 2021-08-19 NOTE — Progress Notes (Signed)
Contacted via [place] afternoon [place], your labs have returned: - Kidney function, creatinine and eGFR, remains normal, as is liver function, AST and ALT.   - Sodium level is a little high still, please cut back on salt intake and increase water.  Watch salt in all foods and drinks, we will recheck next visit.  [name] labs overall stable for you.  Any questions? Keep being stellar!!  Thank you for allowing me to participate in your care.  I appreciate you. Kindest regards, Lamin Chandley

## 2021-08-26 ENCOUNTER — Encounter: Payer: 59 | Admitting: Psychology

## 2021-08-26 NOTE — Progress Notes (Signed)
This encounter was created in error - please disregard.

## 2021-08-28 ENCOUNTER — Encounter: Payer: Self-pay | Admitting: Nurse Practitioner

## 2021-09-02 ENCOUNTER — Encounter: Payer: Self-pay | Admitting: Nurse Practitioner

## 2021-09-02 ENCOUNTER — Other Ambulatory Visit: Payer: Self-pay

## 2021-09-02 ENCOUNTER — Ambulatory Visit (INDEPENDENT_AMBULATORY_CARE_PROVIDER_SITE_OTHER): Payer: Managed Care, Other (non HMO) | Admitting: Nurse Practitioner

## 2021-09-02 VITALS — BP 136/84 | HR 83 | Temp 98.5°F | Wt 275.2 lb

## 2021-09-02 DIAGNOSIS — L729 Follicular cyst of the skin and subcutaneous tissue, unspecified: Secondary | ICD-10-CM | POA: Insufficient documentation

## 2021-09-02 MED ORDER — MUPIROCIN 2 % EX OINT: 1.0000 "application " | TOPICAL_OINTMENT | Freq: Two times a day (BID) | CUTANEOUS | 0 refills | Status: DC

## 2021-09-02 MED ORDER — DOXYCYCLINE HYCLATE 100 MG PO CAPS: 100.0000 mg | ORAL_CAPSULE | Freq: Two times a day (BID) | ORAL | 0 refills | Status: AC

## 2021-09-02 NOTE — Patient Instructions (Signed)
Folliculitis Folliculitis is inflammation of the hair follicles. Folliculitis most commonly occurs on the scalp, thighs, legs, back, and buttocks. However, it can occur anywhere on the body. What are the causes? This condition may be caused by: A bacterial infection (common). A fungal infection. A viral infection. Contact with certain chemicals, especially oils and tars. Shaving or waxing. Greasy ointments or creams applied to the skin. Long-lasting folliculitis and folliculitis that keeps coming back may be caused by bacteria. This bacteria can live anywhere on your skin and is often found in the nostrils. What increases the risk? You are more likely to develop this condition if you have: A weakened immune system. Diabetes. Obesity. What are the signs or symptoms? Symptoms of this condition include: Redness. Soreness. Swelling. Itching. Small white or yellow, pus-filled, itchy spots (pustules) that appear over a reddened area. If there is an infection that goes deep into the follicle, these may develop into a boil (furuncle). A group of closely packed boils (carbuncle). These tend to form in hairy, sweaty areas of the body. How is this diagnosed? This condition is diagnosed with a skin exam. To find what is causing the condition, your health care provider may take a sample of one of the pustules or boils for testing in a lab. How is this treated? This condition may be treated by: Applying warm compresses to the affected areas. Taking an antibiotic medicine or applying an antibiotic medicine to the skin. Applying or bathing with an antiseptic solution. Taking an over-the-counter medicine to help with itching. Having a procedure to drain any pustules or boils. This may be done if a pustule or boil contains a lot of pus or fluid. Having laser hair removal. This may be done to treat long-lasting folliculitis. Follow these instructions at home: Managing pain and swelling  If  directed, apply heat to the affected area as often as told by your health care provider. Use the heat source that your health care provider recommends, such as a moist heat pack or a heating pad. Place a towel between your skin and the heat source. Leave the heat on for 20-30 minutes. Remove the heat if your skin turns bright red. This is especially important if you are unable to feel pain, heat, or cold. You may have a greater risk of getting burned. General instructions If you were prescribed an antibiotic medicine, take it or apply it as told by your health care provider. Do not stop using the antibiotic even if your condition improves. Check the irritated area every day for signs of infection. Check for: Redness, swelling, or pain. Fluid or blood. Warmth. Pus or a bad smell. Do not shave irritated skin. Take over-the-counter and prescription medicines only as told by your health care provider. Keep all follow-up visits as told by your health care provider. This is important. Get help right away if: You have more redness, swelling, or pain in the affected area. Red streaks are spreading from the affected area. You have a fever. Summary Folliculitis is inflammation of the hair follicles. Folliculitis most commonly occurs on the scalp, thighs, legs, back, and buttocks. This condition may be treated by taking an antibiotic medicine or applying an antibiotic medicine to the skin, and applying or bathing with an antiseptic solution. If you were prescribed an antibiotic medicine, take it or apply it as told by your health care provider. Do not stop using the antibiotic even if your condition improves. Get help right away if you have new or  worsening symptoms. Keep all follow-up visits as told by your health care provider. This is important. This information is not intended to replace advice given to you by your health care provider. Make sure you discuss any questions you have with your health  care provider. Document Revised: 01/27/2018 Document Reviewed: 02/04/2018 Elsevier Patient Education  [place].

## 2021-09-02 NOTE — Progress Notes (Signed)
BP 136/84    Pulse 83    Temp 98.5 F (36.9 C) (Oral)    Wt 275 lb 3.2 oz (124.8 kg)    SpO2 98%    BMI 44.42 kg/m    Subjective:    Patient ID: Lori Knight, female    DOB: 1971-03-09, 51 y.o.   MRN: [id number]  HPI: Lori Knight is a 51 y.o. female  Chief Complaint  Patient presents with   Breast Problem    Patient states she had what started off small under her L breast and then within the last week the area has gotten large. Patient states she has not been able to wear a bra and then that has caused a lot of back pain.    BREAST CYST Noticed since 08/19/21.  Can not wear bra or have anything touch it, very tender.  Tried warm compresses at home. Duration :weeks Location: left Onset: sudden Severity: 8/10 Quality: sharp and aching Frequency: intermittent Redness: yes Swelling: yes Trauma: no trauma Breastfeeding: no Associated with menstral cycle: no Nipple discharge: no Breast lump:  cystic like area Status: stable Treatments attempted:  warm compresses Previous mammogram: yes -- scheduled again tomorrow   Relevant past medical, surgical, family and social history reviewed and updated as indicated. Interim medical history since our last visit reviewed. Allergies and medications reviewed and updated.  Review of Systems  Constitutional:  Negative for activity change, appetite change, diaphoresis, fatigue and fever.  Respiratory:  Negative for cough, chest tightness and shortness of breath.   Cardiovascular:  Negative for chest pain, palpitations and leg swelling.  Gastrointestinal: Negative.   Endocrine: Negative for cold intolerance, heat intolerance, polydipsia, polyphagia and polyuria.  Skin:  Positive for wound.  Neurological: Negative.   Psychiatric/Behavioral: Negative.     Per HPI unless specifically indicated above     Objective:    BP 136/84    Pulse 83    Temp 98.5 F (36.9 C) (Oral)    Wt 275 lb 3.2 oz (124.8 kg)    SpO2 98%    BMI 44.42 kg/m    Wt Readings from Last 3 Encounters:  09/02/21 275 lb 3.2 oz (124.8 kg)  08/18/21 278 lb (126.1 kg)  08/17/21 275 lb (124.7 kg)    Physical Exam Vitals and nursing note reviewed.  Constitutional:      General: She is awake. She is not in acute distress.    Appearance: She is well-developed and well-groomed. She is morbidly obese. She is not ill-appearing.  HENT:     Head: Normocephalic.     Right Ear: Hearing normal.     Left Ear: Hearing normal.  Eyes:     General: Lids are normal.        Right eye: No discharge.        Left eye: No discharge.     Conjunctiva/sclera: Conjunctivae normal.     Pupils: Pupils are equal, round, and reactive to light.  Neck:     Thyroid: No thyromegaly.     Vascular: No carotid bruit.  Cardiovascular:     Rate and Rhythm: Normal rate and regular rhythm.     Heart sounds: Normal heart sounds. No murmur heard.   No gallop.  Pulmonary:     Effort: Pulmonary effort is normal. No accessory muscle usage or respiratory distress.     Breath sounds: Normal breath sounds.  Chest:  Breasts:    Right: Normal.     Left: Normal.  Comments: Underneath left breast cystic like area with clear fluid. Abdominal:     General: Bowel sounds are normal.     Palpations: Abdomen is soft.  Musculoskeletal:     Cervical back: Normal range of motion and neck supple.     Right lower leg: No edema.     Left lower leg: No edema.  Lymphadenopathy:     Cervical: No cervical adenopathy.  Skin:    General: Skin is warm and dry.       Neurological:     Mental Status: She is alert and oriented to person, place, and time.  Psychiatric:        Attention and Perception: Attention normal.        Mood and Affect: Mood normal.        Speech: Speech normal.        Behavior: Behavior normal. Behavior is cooperative.        Thought Content: Thought content normal.    Results for orders placed or performed in visit on 08/18/21  Novel Coronavirus, NAA ([facility])    Specimen: Nasopharyngeal(NP) swabs in vial transport medium  Result Value Ref Range   SARS-CoV-2, NAA Not Detected Not Detected  SARS-COV-2, NAA 2 DAY TAT  Result Value Ref Range   SARS-CoV-2, NAA 2 DAY TAT Performed   Bayer DCA Hb A1c Waived  Result Value Ref Range   HB A1C (BAYER DCA - WAIVED) 6.3 (H) 4.8 - 5.6 %  Microalbumin, Urine Waived  Result Value Ref Range   Microalb, Ur Waived 10 0 - 19 mg/L   Creatinine, Urine Waived 50 10 - 300 mg/dL   Microalb/Creat Ratio 30-300 (H) <30 mg/g  CBC with Differential/Platelet  Result Value Ref Range   WBC 10.1 3.4 - 10.8 x10E3/uL   RBC 4.92 3.77 - 5.28 x10E6/uL   Hemoglobin 13.7 11.1 - 15.9 g/dL   Hematocrit 41.2 34.0 - 46.6 %   MCV 84 79 - 97 fL   MCH 27.8 26.6 - 33.0 pg   MCHC 33.3 31.5 - 35.7 g/dL   RDW 14.1 11.7 - 15.4 %   Platelets 321 150 - 450 x10E3/uL   Neutrophils 56 Not Estab. %   Lymphs 33 Not Estab. %   Monocytes 7 Not Estab. %   Eos 2 Not Estab. %   Basos 1 Not Estab. %   Neutrophils Absolute 5.8 1.4 - 7.0 x10E3/uL   Lymphocytes Absolute 3.3 (H) 0.7 - 3.1 x10E3/uL   Monocytes Absolute 0.7 0.1 - 0.9 x10E3/uL   EOS (ABSOLUTE) 0.2 0.0 - 0.4 x10E3/uL   Basophils Absolute 0.1 0.0 - 0.2 x10E3/uL   Immature Granulocytes 1 Not Estab. %   Immature Grans (Abs) 0.1 0.0 - 0.1 x10E3/uL  Comprehensive metabolic panel  Result Value Ref Range   Glucose 116 (H) 70 - 99 mg/dL   BUN 10 6 - 24 mg/dL   Creatinine, Ser 0.85 0.57 - 1.00 mg/dL   eGFR 83 >59 mL/min/1.73   BUN/Creatinine Ratio 12 9 - 23   Sodium 146 (H) 134 - 144 mmol/L   Potassium 4.3 3.5 - 5.2 mmol/L   Chloride 107 (H) 96 - 106 mmol/L   CO2 24 20 - 29 mmol/L   Calcium 9.1 8.7 - 10.2 mg/dL   Total Protein 6.4 6.0 - 8.5 g/dL   Albumin 4.3 3.8 - 4.9 g/dL   Globulin, Total 2.1 1.5 - 4.5 g/dL   Albumin/Globulin Ratio 2.0 1.2 - 2.2   Bilirubin Total <0.2 0.0 -  1.2 mg/dL   Alkaline Phosphatase 139 (H) 44 - 121 IU/L   AST 16 0 - 40 IU/L   ALT 26 0 - 32 IU/L  Lipid  Panel w/o Chol/HDL Ratio  Result Value Ref Range   Cholesterol, Total 157 100 - 199 mg/dL   Triglycerides 282 (H) 0 - 149 mg/dL   HDL 34 (L) >39 mg/dL   VLDL Cholesterol Cal 46 (H) 5 - 40 mg/dL   LDL Chol Calc (NIH) 77 0 - 99 mg/dL  Magnesium  Result Value Ref Range   Magnesium 1.8 1.6 - 2.3 mg/dL  Veritor Flu A/B Waived  Result Value Ref Range   Influenza A Negative Negative   Influenza B Negative Negative      Assessment & Plan:   Problem List Items Addressed This Visit       Musculoskeletal and Integument   Skin cyst - Primary    Underneath left breast with moderate erythema and exquisitely tender.  At this time will place referral to general surgery due to location and tenderness, would benefit their assistance with this for I&D.  Start Doxycycline 100 MG BID for 7 days and Mupirocin to site BID.  Plan on follow-up in one week.      Relevant Orders   Ambulatory referral to General Surgery     Follow up plan: Return in about 1 week (around 09/09/2021) for Cyst.

## 2021-09-02 NOTE — Assessment & Plan Note (Signed)
Underneath left breast with moderate erythema and exquisitely tender.  At this time will place referral to general surgery due to location and tenderness, would benefit their assistance with this for I&D.  Start Doxycycline 100 MG BID for 7 days and Mupirocin to site BID.  Plan on follow-up in one week.

## 2021-09-03 ENCOUNTER — Ambulatory Visit (INDEPENDENT_AMBULATORY_CARE_PROVIDER_SITE_OTHER): Payer: Managed Care, Other (non HMO) | Admitting: Bariatrics

## 2021-09-03 ENCOUNTER — Encounter: Payer: Self-pay | Admitting: Surgery

## 2021-09-03 ENCOUNTER — Ambulatory Visit: Payer: Managed Care, Other (non HMO) | Admitting: Surgery

## 2021-09-03 VITALS — BP 142/94 | HR 80 | Temp 98.1°F | Ht 66.0 in | Wt 274.2 lb

## 2021-09-03 DIAGNOSIS — N611 Abscess of the breast and nipple: Secondary | ICD-10-CM | POA: Diagnosis not present

## 2021-09-03 DIAGNOSIS — N6082 Other benign mammary dysplasias of left breast: Secondary | ICD-10-CM | POA: Insufficient documentation

## 2021-09-03 DIAGNOSIS — L03319 Cellulitis of trunk, unspecified: Secondary | ICD-10-CM

## 2021-09-03 DIAGNOSIS — L02219 Cutaneous abscess of trunk, unspecified: Secondary | ICD-10-CM | POA: Insufficient documentation

## 2021-09-03 NOTE — Progress Notes (Signed)
Patient ID: Lori Knight, female   DOB: 1970-12-19, 51 y.o.   MRN: [id number]  Chief Complaint: Week and a half history of progressive inframammary left breast abscess  History of Present Illness Lori Knight is a 51 y.o. female with a spot in her inframammary area that began with tenderness and some clear drainage.  However this progressed with increasing tenderness, was seen after trial of warm compresses yesterday with increased area of redness and induration under the left breast.  She was then started on antibiotics.  Overnight the cyst began to drain spontaneously.  She is currently draining a purulent fluid.  She reports her pain is 7 out of 10.  She denies nausea vomiting fevers and chills.  Past Medical History Past Medical History:  Diagnosis Date   Adult hypothyroidism 06/21/2012   Anxiety    Back pain    Benign paroxysmal positional nystagmus    Bilateral polycystic ovarian syndrome 06/21/2012   Biliary calculus with cholecystitis 08/14/2012   Bipolar 1 disorder (HCC)    BP (high blood pressure) 02/14/2014   Bronchitis    Constipation    COVID-19 07/2020   Depression    Diabetes mellitus ([place]) 08/14/2012   Diabetes mellitus without complication ([place])    pre-diabetic   Dysrhythmia    wore heart monitor 2016. Corrected by changing Levothyroxine dose.   Edema of both lower extremities    Gallbladder problem    GERD (gastroesophageal reflux disease)    Heart murmur    followed by PCP-AS A CHILD-ASYMPTOMATIC   Hyperlipidemia    Hyperprolactinemia ([place]) 06/21/2012   Hypertension    Hypothyroid    Hypoxia 04/17/2018   Joint pain    Kidney problem    Meningioma (HCC)    Motion sickness    carnival rides   Neuropathy    PONV (postoperative nausea and vomiting)    Low BP after sinus surgery. WAKES UP CRYING   Shortness of breath dyspnea    stairs. related to wt.   Sleep apnea    has CPAP. has not used since 2011   Swallowing difficulty    Vertigo 2016   none  recently   Vitamin D deficiency       Past Surgical History:  Procedure Laterality Date   CHOLECYSTECTOMY     COLONOSCOPY WITH PROPOFOL N/A 06/19/2015   Procedure: COLONOSCOPY WITH PROPOFOL;  Surgeon: [name], MD;  Location: [facility];  Service: Endoscopy;  Laterality: N/A;  Diabetic - oral meds    DENTAL SURGERY     ESOPHAGOGASTRODUODENOSCOPY (EGD) WITH PROPOFOL N/A 01/16/2021   Procedure: ESOPHAGOGASTRODUODENOSCOPY (EGD) WITH PROPOFOL;  Surgeon: [name], MD;  Location: [facility];  Service: Endoscopy;  Laterality: N/A;  Diabetic - oral meds   ETHMOIDECTOMY Bilateral 04/17/2018   Procedure: ETHMOIDECTOMY;  Surgeon: [name], MD;  Location: [facility];  Service: ENT;  Laterality: Bilateral;   GALLBLADDER SURGERY     IMAGE GUIDED SINUS SURGERY N/A 04/17/2018   Procedure: IMAGE GUIDED SINUS SURGERY;  Surgeon: [name], MD;  Location: [facility];  Service: ENT;  Laterality: N/A;   MAXILLARY ANTROSTOMY Bilateral 04/17/2018   Procedure: MAXILLARY ANTROSTOMY;  Surgeon: [name], MD;  Location: [facility];  Service: ENT;  Laterality: Bilateral;   NASAL SEPTOPLASTY W/ TURBINOPLASTY Bilateral 04/17/2018   Procedure: NASAL SEPTOPLASTY WITH TURBINATE REDUCTION;  Surgeon: [name], MD;  Location: [facility];  Service: ENT;  Laterality: Bilateral;   TOTAL ABDOMINAL HYSTERECTOMY  2012   cervical dysplasia/ovaries remian  TRANSURETHRAL RESECTION OF BLADDER TUMOR N/A 04/17/2018   Procedure: NTRANSURETHRAL RESECTION OF BLADDER TUMOR (TURBT) WITH GEMCITABINE;  Surgeon: [name], MD;  Location: [facility];  Service: Urology;  Laterality: N/A;    Allergies  Allergen Reactions   Hctz [Hydrochlorothiazide] Cough   Lisinopril Cough   Latex Rash    Condoms only   Prednisone Anxiety    Paranoia    Current Outpatient Medications  Medication Sig Dispense Refill   acetaminophen (TYLENOL) 500 MG tablet Take 500 mg by mouth every 6 (six) hours as needed.      amLODipine (NORVASC) 5 MG tablet Take 1 tablet (5 mg total) by mouth daily. 90 tablet 4   Ascorbic Acid (VITAMIN C) 1000 MG tablet Take 1,000 mg by mouth daily.     atorvastatin (LIPITOR) 20 MG tablet Take 1 tablet (20 mg total) by mouth daily. 90 tablet 4   Cholecalciferol 5000 units capsule Take 1 capsule (5,000 Units total) by mouth daily.     CONTOUR NEXT TEST test strip USE TO CHECK BS UP TO 3 TIMES DAILY FOR DIABETES DX: E11.9 100 strip 12   doxycycline (VIBRAMYCIN) 100 MG capsule Take 1 capsule (100 mg total) by mouth 2 (two) times daily for 7 days. 14 capsule 0   Dulaglutide (TRULICITY) 3 ZD/6.3OV SOPN Inject 3 mg as directed once a week. 6 mL 4   lamoTRIgine (LAMICTAL) 150 MG tablet Take 300 mg by mouth daily.     levothyroxine (SYNTHROID) 150 MCG tablet Take 1 tablet (150 mcg total) by mouth daily. 90 tablet 4   losartan (COZAAR) 50 MG tablet Take 2 tablets (100 mg total) by mouth daily. 180 tablet 4   metFORMIN (GLUCOPHAGE) 500 MG tablet Take 1,000 mg by mouth 2 (two) times daily.     mometasone (ELOCON) 0.1 % cream SMARTSIG:1 Application Topical     mupirocin ointment (BACTROBAN) 2 % Apply 1 application topically 2 (two) times daily. 22 g 0   Omega-3 Fatty Acids (FISH OIL) 1200 MG CAPS Take 1,200 mg by mouth daily.      omeprazole (PRILOSEC) 40 MG capsule TAKE 1 CAPSULE BY MOUTH EVERY DAY 90 capsule 4   ondansetron (ZOFRAN ODT) 4 MG disintegrating tablet Take 1 tablet (4 mg total) by mouth every 8 (eight) hours as needed for nausea or vomiting. 20 tablet 0   No current facility-administered medications for this visit.    Family History Family History  Problem Relation Age of Onset   Diabetes Maternal Grandmother    Hypertension Maternal Grandmother    Hyperlipidemia Maternal Grandmother    Hypothyroidism Maternal Grandmother    Heart disease Maternal Grandmother    Diabetes Mother    Hypertension Mother    Hypothyroidism Mother    Bipolar disorder Mother    Heart disease  Mother    Kidney disease Mother    Thyroid disease Mother    Depression Mother    Anxiety disorder Mother    Drug abuse Mother    Obesity Mother    Alcohol abuse Father    Depression Daughter    Breast cancer Neg Hx       Social History Social History   Tobacco Use   Smoking status: Former    Packs/day: 1.00    Years: 10.00    Pack years: 10.00    Types: Cigarettes    Quit date: 07/12/2002    Years since quitting: 19.1   Smokeless tobacco: Never   Tobacco comments:  quit 2004  Vaping Use   Vaping Use: Never used  Substance Use Topics   Alcohol use: Yes    Alcohol/week: 0.0 standard drinks    Comment: rarely   Drug use: No        Review of Systems  Constitutional:  Positive for malaise/fatigue. Negative for chills, fever and weight loss.  HENT: Negative.    Eyes: Negative.   Respiratory: Negative.    Cardiovascular: Negative.   Gastrointestinal:  Positive for heartburn.  Genitourinary:  Positive for frequency and urgency.  Skin:  Negative for itching and rash.  Neurological:  Positive for tingling.  Psychiatric/Behavioral:  Positive for depression.      Physical Exam Blood pressure (!) 142/94, pulse 80, temperature 98.1 F (36.7 C), temperature source Oral, height 5\' 6"  (1.676 m), weight 274 lb 3.2 oz (124.4 kg), SpO2 98 %. Last Weight  Most recent update: 09/03/2021  8:52 AM    Weight  124.4 kg (274 lb 3.2 oz)             CONSTITUTIONAL: Well developed, morbidly obese, but adequately nourished, appropriately responsive and aware without distress.   EYES: Sclera non-icteric.   EARS, NOSE, MOUTH AND THROAT: Mask worn.   The oropharynx is clear. Oral mucosa is pink and moist.    Hearing is intact to voice.  NECK: Trachea is midline, and there is no jugular venous distension.  LYMPH NODES:  Lymph nodes in the neck are not enlarged. RESPIRATORY:  Lungs are clear, and breath sounds are equal bilaterally. Normal respiratory effort without pathologic use of  accessory muscles. CARDIOVASCULAR: Heart is regular in rate and rhythm. GI: The abdomen is well rounded, soft, nontender, and nondistended. There were no palpable masses. I did not appreciate hepatosplenomegaly.  GU: Levada Dy is present as chaperone.  Under the left breast there is a 3 cm wide area of fluctuance, correlating with the inframammary crease.  There is purulent drainage on the lateralmost extent of this area, and some adjacent erythema surrounding it.  It is remarkably tender and she jumps with the slightest touch. MUSCULOSKELETAL:  Symmetrical muscle tone appreciated in all four extremities.    SKIN: Skin turgor is normal. No pathologic skin lesions appreciated.  NEUROLOGIC:  Motor and sensation appear grossly normal.  Cranial nerves are grossly without defect. PSYCH:  Alert and oriented to person, place and time. Affect is appropriate for situation.  Data Reviewed I have personally reviewed what is currently available of the patient's imaging, recent labs and medical records.   Labs:  CBC Latest Ref Rng & Units 08/18/2021 05/15/2021 07/13/2020  WBC 3.4 - 10.8 x10E3/uL 10.1 8.6 7.0  Hemoglobin 11.1 - 15.9 g/dL 13.7 14.4 13.4  Hematocrit 34.0 - 46.6 % 41.2 44.5 41.8  Platelets 150 - 450 x10E3/uL 321 302 251   CMP Latest Ref Rng & Units 08/18/2021 07/30/2021 03/12/2021  Glucose 70 - 99 mg/dL 116(H) 104(H) 143(H)  BUN 6 - 24 mg/dL 10 14 10   Creatinine 0.57 - 1.00 mg/dL 0.85 0.77 0.83  Sodium 134 - 144 mmol/L 146(H) 145(H) 143  Potassium 3.5 - 5.2 mmol/L 4.3 4.4 4.3  Chloride 96 - 106 mmol/L 107(H) 109(H) 105  CO2 20 - 29 mmol/L 24 21 23   Calcium 8.7 - 10.2 mg/dL 9.1 9.7 9.0  Total Protein 6.0 - 8.5 g/dL 6.4 - 6.4  Total Bilirubin 0.0 - 1.2 mg/dL <0.2 - <0.2  Alkaline Phos 44 - 121 IU/L 139(H) - 137(H)  AST 0 - 40 IU/L  16 - 17  ALT 0 - 32 IU/L 26 - 28      Imaging:  Within last 24 hrs: No results found.  Assessment    Inframamillary abscess, left side.  Suspect secondary to  infected sebaceous cyst. Patient Active Problem List   Diagnosis Date Noted   Skin cyst 09/02/2021   Nasal congestion 08/18/2021   RLQ abdominal pain 04/16/2021   Aortic atherosclerosis ([place]) 09/30/2020   Type 2 diabetes mellitus with proteinuria ([place]) 02/20/2020   Meningioma ([place]) 12/24/2019   Hyperlipidemia associated with type 2 diabetes mellitus ([place]) 08/13/2019   Gastroesophageal reflux disease without esophagitis 06/20/2019   Type 2 diabetes mellitus with morbid obesity ([place]) 12/01/2015   OSA on CPAP 09/22/2015   Insomnia 08/28/2015   Internal hemorrhoids 08/28/2015   Vitamin D deficiency 08/28/2015   Obesity, Class III, BMI 40-49.9 (morbid obesity) ([place]) 08/27/2015   Hypertension associated with diabetes ([place]) 02/14/2014   Benign paroxysmal positional nystagmus 02/14/2014   Benign neoplasm of kidney 08/14/2012   Bipolar I disorder, single manic episode ([place]) 08/14/2012   Adult hypothyroidism 06/21/2012   Bilateral polycystic ovarian syndrome 06/21/2012    Plan    Incision and drainage left inframamillary abscess.  After informed consent was obtained, the patient was placed in the supine position.  Her left breast was held up to allow adequate exposure.  The site was prepped with ChloraPrep, draped appropriately.  Local anesthesia 1% lidocaine with epinephrine is instilled into the overlying skin.  Starting from the point of drainage immediately I utilized an 11 blade to lay open the abscess cavity.  I then explored the cavity briefly identifying a depth of approximately 2 cm.  Then irrigated the cavity, placed a length of half-inch wide iodoform packing strip to its depth.  Her husband was not available for an instruction in how to assist with wound care.  We gave her specific instructions including that she may shower with her dressing off and replace the dressing on a daily basis.  Believe she understands how important this is to keep her wound open for adequate infection  control and for progress toward healing. We will have her back within 10 days for follow-up.  Face-to-face time spent with the patient and accompanying care providers(if present) was 55 minutes, with more than 50% of the time spent counseling, educating, and coordinating care of the patient.    These notes generated with voice recognition software. I apologize for typographical errors.  Ronny Bacon M.D., FACS 09/03/2021, 12:16 PM

## 2021-09-03 NOTE — Patient Instructions (Addendum)
If you have any concerns or questions, please feel free to call our office. See follow up appointment below.   Epidermoid Cyst Removal, Care After This sheet gives you information about how to care for yourself after your procedure. Your health care provider may also give you more specific instructions. If you have problems or questions, contact your health care provider. What can I expect after the procedure? After the procedure, it is common to have: Soreness in the area where your cyst was removed. Tightness or itchiness from the stitches (sutures) in your skin. Follow these instructions at home: Medicines Take over-the-counter and prescription medicines only as told by your health care provider. If you were prescribed an antibiotic medicine or ointment, take or apply it as told by your health care provider. Do not stop using the antibiotic even if you start to feel better. Incision care  Follow instructions from your health care provider about how to take care of your incision. Make sure you: Wash your hands with soap and water for at least 20 seconds before you change your bandage (dressing). If soap and water are not available, use hand sanitizer. Change your dressing as told by your health care provider. Leave sutures, skin glue, or adhesive strips in place. These skin closures may need to stay in place for 1-2 weeks or longer. If adhesive strip edges start to loosen and curl up, you may trim the loose edges. Do not remove adhesive strips completely unless your health care provider tells you to do that. Keep the dressing dry until your health care provider says that it can be removed. After your dressing is off, check your incision area every day for signs of infection. Check for: Redness, swelling, or pain. Fluid or blood. Warmth. Pus or a bad smell. General instructions Do not take baths, swim, or use a hot tub until your health care provider approves. Ask your health care provider  if you may take showers. You may only be allowed to take sponge baths. Your health care provider may ask you to avoid contact sports or activities that take a lot of effort. Do not do anything that stretches or puts pressure on your incision. You can return to your normal diet. Keep all follow-up visits. This is important. Contact a health care provider if: You have a fever. You have redness, swelling, or pain in the incision area. You have fluid or blood coming from your incision. You have pus or a bad smell coming from your incision. Your incision feels warm to the touch. Your cyst grows back. Get help right away if: If the incision site suddenly increases in size and you have pain at the incision site. You may be checked for a collection of blood under the skin from the procedure (hematoma). Summary After the procedure, it is common to have soreness in the area where your cyst was removed. Take or apply over-the-counter and prescription medicines only as told by your health care provider. Follow instructions from your health care provider about how to take care of your incision. This information is not intended to replace advice given to you by your health care provider. Make sure you discuss any questions you have with your health care provider. Document Revised: 10/03/2019 Document Reviewed: 10/03/2019 Elsevier Patient Education  [employer].

## 2021-09-07 ENCOUNTER — Other Ambulatory Visit: Payer: Self-pay

## 2021-09-07 MED ORDER — METFORMIN HCL 500 MG PO TABS: 1000.0000 mg | ORAL_TABLET | Freq: Two times a day (BID) | ORAL | 4 refills | Status: DC

## 2021-09-08 ENCOUNTER — Other Ambulatory Visit: Payer: Self-pay

## 2021-09-10 ENCOUNTER — Encounter: Payer: Self-pay | Admitting: Surgery

## 2021-09-10 ENCOUNTER — Ambulatory Visit (INDEPENDENT_AMBULATORY_CARE_PROVIDER_SITE_OTHER): Payer: Managed Care, Other (non HMO) | Admitting: Surgery

## 2021-09-10 ENCOUNTER — Ambulatory Visit: Payer: Managed Care, Other (non HMO) | Admitting: Nurse Practitioner

## 2021-09-10 ENCOUNTER — Other Ambulatory Visit: Payer: Self-pay

## 2021-09-10 VITALS — BP 144/96 | HR 75 | Temp 98.1°F | Ht 66.0 in | Wt 272.0 lb

## 2021-09-10 DIAGNOSIS — L02219 Cutaneous abscess of trunk, unspecified: Secondary | ICD-10-CM

## 2021-09-10 DIAGNOSIS — L03319 Cellulitis of trunk, unspecified: Secondary | ICD-10-CM

## 2021-09-10 DIAGNOSIS — Z09 Encounter for follow-up examination after completed treatment for conditions other than malignant neoplasm: Secondary | ICD-10-CM

## 2021-09-10 NOTE — Progress Notes (Signed)
[facility] ?POST-OP OFFICE VISIT ? ?09/10/2021 ? ?HPI: ?Lori Knight is a 51 y.o. female 7 days s/p incision and drainage of left inframammary breast abscess.  Microbiology confirms multiple skin flora.  She has been doing dressing changes daily, her husband is having more challenging getting packing in place due to tenderness.  She denies fevers and chills.  Expected bloody discharge on dressing. ? ?Vital signs: ?BP (!) 144/96   Pulse 75   Temp 98.1 ?F (36.7 ?C) (Oral)   Ht 5\' 6"  (1.676 m)   Wt 272 lb (123.4 kg)   SpO2 96%   BMI 43.90 kg/m?   ? ?Physical Exam: ?Constitutional: Appears well nontoxic ?Skin: Inframammary breast wound appears to be 100% granulated, there is still a depth of a centimeter and 1&1/2 to 2 cm, that our Q-tip head.  There is no evidence of any undrained pus, tunneling or tracking.  Surrounding skin appears without erythema, and there is no induration.  She still little tender on deep exam with a Q-tip probe, but she tolerates it well. ? ?Assessment/Plan: ?This is a 51 y.o. female 7 days s/p I&D of left inframammary abscess. ? ?Patient Active Problem List  ? Diagnosis Date Noted  ? Cellulitis and abscess of trunk 09/03/2021  ? Sebaceous cyst of breast, left 09/03/2021  ? Skin cyst 09/02/2021  ? Nasal congestion 08/18/2021  ? RLQ abdominal pain 04/16/2021  ? Aortic atherosclerosis ([place]) 09/30/2020  ? Type 2 diabetes mellitus with proteinuria ([place]) 02/20/2020  ? Meningioma ([place]) 12/24/2019  ? Hyperlipidemia associated with type 2 diabetes mellitus ([place]) 08/13/2019  ? Gastroesophageal reflux disease without esophagitis 06/20/2019  ? Type 2 diabetes mellitus with morbid obesity ([place]) 12/01/2015  ? OSA on CPAP 09/22/2015  ? Insomnia 08/28/2015  ? Internal hemorrhoids 08/28/2015  ? Vitamin D deficiency 08/28/2015  ? Obesity, Class III, BMI 40-49.9 (morbid obesity) ([place]) 08/27/2015  ? Hypertension associated with diabetes ([place]) 02/14/2014  ? Benign paroxysmal positional  nystagmus 02/14/2014  ? Benign neoplasm of kidney 08/14/2012  ? Bipolar I disorder, single manic episode ([place]) 08/14/2012  ? Adult hypothyroidism 06/21/2012  ? Bilateral polycystic ovarian syndrome 06/21/2012  ? ? -Advised that and lieu of packing, the wound is probed daily with Q-tip, and cover dressing to protect clothing from drainage.  She can follow-up in 2 weeks or as needed.  No additional antibiotics are necessary to continue. ? ? ?Ronny Bacon M.D., FACS ?09/10/2021, 2:37 PM ? ?

## 2021-09-10 NOTE — Patient Instructions (Addendum)
Pr the wound with a Q-tip daily while in the shower. Place a dry dressing over the area and secure with tape. Please see your follow up appointment listed below.  ?

## 2021-09-11 LAB — ANAEROBIC AND AEROBIC CULTURE

## 2021-09-15 ENCOUNTER — Encounter: Payer: Managed Care, Other (non HMO) | Admitting: Surgery

## 2021-09-16 ENCOUNTER — Ambulatory Visit: Payer: Managed Care, Other (non HMO) | Admitting: Nurse Practitioner

## 2021-09-20 NOTE — Patient Instructions (Signed)
Breast Cyst ?A breast cyst is a sac in the breast that is filled with fluid. They are usually noncancerous (benign) and are common among women. Breast cysts are most often in the upper, outer portion of the breast. One or more cysts may develop. They form when fluid builds up inside the breast glands. ?There are several types of breast cysts. Some are too small to feel, but these can be seen with imaging tests such as an X-ray of the breast (mammogram) or ultrasound. ?Breast cysts do not increase your risk of breast cancer. They usually disappear after you no longer have a menstrual cycle (after menopause), unless you take artificial hormones (are on hormone therapy). ?What are the causes? ?This condition may be caused by: ?Blockage of tubes (ducts) in the breast glands, which leads to fluid buildup. Duct blockage may result from: ?Fibrocystic breast changes. This is a common, benign condition that occurs when women go through hormonal changes during the menstrual cycle. This is a common cause of multiple breast cysts. ?Overgrowth of breast tissue or breast glands. ?Scar tissue in the breast from previous surgery. ?Changes in certain female hormones (estrogen and progesterone). ?The exact cause of this condition is not known. ?What increases the risk? ?You may be more likely to develop breast cysts if you have not gone through menopause. ?What are the signs or symptoms? ?Symptoms of this condition include: ?Feeling one or more smooth, round, soft lumps (like grapes) in the breast that are easily movable. The lump or lumps may get bigger and more painful before your menstrual period and get smaller after your menstrual period. ?Breast discomfort or pain. ?How is this diagnosed? ?This condition may be diagnosed based on: ?A physical exam. A cyst can be felt by your health care provider during this exam. ?Imaging tests, such as mammogram or ultrasound. ?Fluid may be removed from the cyst with a needle (fine-needle  aspiration) and tested to make sure the cyst is not cancerous. ?How is this treated? ?Treatment may not be needed for this condition. Your health care provider may monitor the cyst to see if it goes away on its own. If the cyst is uncomfortable or gets bigger, or if you do not like how the cyst makes your breast look, you may need treatment. Treatment may include: ?Hormone therapy. ?Fine-needle aspiration to drain fluid from the cyst. There is a chance of the cyst coming back (recurring) after aspiration. ?Surgery to remove the cyst. ?Follow these instructions at home: ?Self-exams ? ?Do a breast self-exam every month, or as often as directed. A breast self-exam involves: ?Comparing your breasts in the mirror. ?Looking for visible changes in your skin or nipples. ?Feeling for lumps or changes. ?Having many breast cysts may make it harder to feel for new lumps. Understand how your breasts normally look and feel, and write down any changes in your breasts. Tell your health care provider about any changes. ?Eating and drinking ?Follow instructions from your health care provider about eating and drinking restrictions. ?Drink enough fluid to keep your urine pale yellow. ?Avoid caffeine. ?Cut down on salt (sodium) in what you eat and drink, especially before your menstrual period. Too much sodium can cause fluid buildup, breast swelling, and discomfort. ?General instructions ?See your health care provider regularly. ?Get a yearly physical exam. ?If you are 1-58 years old, get a clinical breast exam every 1-3 years. After the age of 74 years, get this exam every year. ?Get mammograms as often as directed. ?Take over-the-counter  and prescription medicines only as told by your health care provider. ?Wear a supportive bra, especially when exercising. ?Keep all follow-up visits as told by your health care provider. This is important. ?Contact a health care provider if: ?You feel, or think you feel, a lump in your breast. ?You  notice that both breasts look or feel different than usual. ?Your breast is still causing pain after your menstrual period is over. ?You find new lumps or bumps that were not there before. ?You feel lumps in your armpit. ?Get help right away if: ?You have severe pain, tenderness, redness, or warmth in your breast. ?You have fluid or blood leaking from your nipple. ?Your breast lump becomes hard and painful. ?You notice dimpling or wrinkling of the breast or nipple. ?Summary ?A breast cyst is a sac in the breast that is filled with fluid. ?Treatment may not be needed for this condition. ?If the cyst is uncomfortable or gets bigger, or if you do not like how the cyst makes your breast look, you may need treatment. ?This information is not intended to replace advice given to you by your health care provider. Make sure you discuss any questions you have with your health care provider. ?Document Revised: 11/14/2018 Document Reviewed: 11/14/2018 ?Elsevier Patient Education ? [place]. ? ?

## 2021-09-24 ENCOUNTER — Ambulatory Visit: Payer: Managed Care, Other (non HMO) | Admitting: Surgery

## 2021-09-24 ENCOUNTER — Other Ambulatory Visit: Payer: Self-pay

## 2021-09-24 ENCOUNTER — Encounter: Payer: Self-pay | Admitting: Surgery

## 2021-09-24 ENCOUNTER — Ambulatory Visit (INDEPENDENT_AMBULATORY_CARE_PROVIDER_SITE_OTHER): Payer: 59 | Admitting: Psychology

## 2021-09-24 VITALS — BP 156/77 | HR 80 | Temp 98.0°F | Ht 66.0 in | Wt 275.8 lb

## 2021-09-24 DIAGNOSIS — F309 Manic episode, unspecified: Secondary | ICD-10-CM

## 2021-09-24 DIAGNOSIS — L929 Granulomatous disorder of the skin and subcutaneous tissue, unspecified: Secondary | ICD-10-CM | POA: Diagnosis not present

## 2021-09-24 NOTE — Patient Instructions (Addendum)
Dip a Q-tip into some peroxide and probe the wound daily. Keep a dry dressing over the area and secure with tape.  ? ?Please see your follow up appointment listed below. ?

## 2021-09-24 NOTE — Progress Notes (Signed)
[facility] Counselor/Therapist Progress Note ? ?Patient ID: Lori Knight, MRN: [id number],   ? ?Date: 09/24/2021 ? ?Time Spent: 8:06 am - 8:45 am:  39 minutes ? ?Treatment Type: Individual Therapy. ? ?Reported Symptoms: anxiety  ? ?Mental Status Exam: ?Appearance:  Neat and Well Groomed     ?Behavior: Appropriate  ?Motor: Normal  ?Speech/Language:  Clear and Coherent and Normal Rate  ?Affect: Congruent and Tearful  ?Mood: sad  ?Thought process: normal  ?Thought content:   WNL  ?Sensory/Perceptual disturbances:   WNL  ?Orientation: oriented to person, place, time/date, and situation  ?Attention: Good  ?Concentration: Good  ?Memory: WNL  ?Fund of knowledge:  Good  ?Insight:   Good  ?Judgment:  Good  ?Impulse Control: Good  ? ?Subjective:  ? ?Lori Knight participated home, via video, and consented to treatment. Therapist participated from home office.We met online due to [place] reviewed the events of the past two weeks. Lori Knight noted missing our previous appointment due to mood related issues. Lori Knight noted continued struggles with financial difficulty and noted negative self-talk and worry regarding her low earnings and the possible effect on her relationship. We explored this during the session and the effects of this anxiety on her overall mood and functioning. She noted a recent argument with her husband, months ago, which she has struggled to manage the feelings related to.   Lori Knight continues to ruminate regarding his previous disagreement with her husband.  She noted worry that at any point the relationship might devolve and she might be required to live outside the home.  Lori Knight denied any current stress with her husband but is worried of the possibility that there might be some decline in their marriage.  We worked on processing this during the session and discussed ways to manage this anxiety by challenging negative thoughts, and looking at evidence.  Additionally, Lori Knight  discussed struggling with work, due to limited earnings, and discussed the worry that this might create an issue with her husband.  We explored this during the session and Lori Knight discussed that her husband has been supportive and has not complained about increased output from his behalf.  We worked on challenging her negative thoughts regarding these feelings but do not have any factual basis based off of Lori Knight's disclosure.  Lori Knight discussed areas, and professional setting, that she could put improve on or engage in that might aid in providing her financial stability but noting a significant barrier, perfection, affecting her ability to complete this task.  We worked on problem solving this and setting reasonable expectations.  Lori Knight was engaged and motivated during the session and expressed commitment towards her goals.  We reviewed ways to challenge negative thoughts and feelings during the session.  Following benefit from continued treatment and was scheduled for numerous follow-ups.  Therapist validated Lori Knight's positive cognitions and feelings and provided supportive therapy. ? ? ?Interventions: Cognitive Behavioral Therapy and Interpersonal ? ?Diagnosis:Manic disorder, single episode ([place]) ? ? ?Plan: Patient is to use CBT, ACT, BA, mindfulness, and coping skills to help manage decrease symptoms associated with their diagnosis. (Target Date 01/05/22) ?  ?Long-term goal:   ?Reduce overall level, frequency, and intensity of the feelings of depression and anxiety evidenced by negative self-talk, rumination, and poor self-esteem  from 6 to 7 days/week to 0 to 1 days/week per client report for at least 3 consecutive months. ? ?Short-term goal:  ?Verbally express understanding of the relationship between feelings of depression, anxiety and their impact on  thinking patterns and behaviors. ? ?Verbalize an understanding of the role that distorted thinking plays in creating fears, excessive worry, and ruminations. ? ?(Lori Knight  participated in the creation of the treatment plan) ? ?Lori Irish, LCSW ?

## 2021-09-24 NOTE — Progress Notes (Signed)
[facility] ?POST-OP OFFICE VISIT ? ?09/24/2021 ? ?HPI: ?Lori Knight is a 51 y.o. female who returns today, having stopped probing her inframamillary wound with a Q-tip about 4 days ago.  She still has minimal drainage.  ? ?Vital signs: ?BP (!) 156/77   Pulse 80   Temp 98 ?F (36.7 ?C) (Oral)   Ht '5\' 6"'$  (1.676 m)   Wt 275 lb 12.8 oz (125.1 kg)   SpO2 99%   BMI 44.52 kg/m?   ? ?Physical Exam: ?Constitutional: She appears well. ? ?Skin: Inframammary there is a punctum of proud flesh with incomplete epidermal growth over top of it.  I probed this area to the depth of a silver nitrate tip.  Unfortunately it remains quite tender and she does not tolerate the pain well.  But there is just a small sinus tract full of proud flesh that needs to be treated. ? ?Assessment/Plan: ?Hypergranulation of residual abscess wound left inframammary site. ? ?Patient Active Problem List  ? Diagnosis Date Noted  ? Cellulitis and abscess of trunk 09/03/2021  ? Sebaceous cyst of breast, left 09/03/2021  ? Skin cyst 09/02/2021  ? Nasal congestion 08/18/2021  ? RLQ abdominal pain 04/16/2021  ? Aortic atherosclerosis ([place]) 09/30/2020  ? Type 2 diabetes mellitus with proteinuria ([place]) 02/20/2020  ? Meningioma ([place]) 12/24/2019  ? Hyperlipidemia associated with type 2 diabetes mellitus ([place]) 08/13/2019  ? Gastroesophageal reflux disease without esophagitis 06/20/2019  ? Type 2 diabetes mellitus with morbid obesity ([place]) 12/01/2015  ? OSA on CPAP 09/22/2015  ? Insomnia 08/28/2015  ? Internal hemorrhoids 08/28/2015  ? Vitamin D deficiency 08/28/2015  ? Obesity, Class III, BMI 40-49.9 (morbid obesity) ([place]) 08/27/2015  ? Hypertension associated with diabetes ([place]) 02/14/2014  ? Benign paroxysmal positional nystagmus 02/14/2014  ? Benign neoplasm of kidney 08/14/2012  ? Bipolar I disorder, single manic episode ([place]) 08/14/2012  ? Adult hypothyroidism 06/21/2012  ? Bilateral polycystic ovarian syndrome 06/21/2012  ? ? -Silver  nitrate application today.  Advised to continue probing with Q-tip and do not give up too easily.  Utilize hydrogen peroxide as needed to cleanse wound.  We will have her follow-up in 1 week. ? ? ?Ronny Bacon M.D., FACS ?09/24/2021, 9:39 AM  ?

## 2021-09-25 ENCOUNTER — Telehealth: Payer: Self-pay | Admitting: Nurse Practitioner

## 2021-09-25 ENCOUNTER — Encounter: Payer: Self-pay | Admitting: Nurse Practitioner

## 2021-09-25 ENCOUNTER — Other Ambulatory Visit: Payer: Self-pay

## 2021-09-25 ENCOUNTER — Ambulatory Visit (INDEPENDENT_AMBULATORY_CARE_PROVIDER_SITE_OTHER): Payer: Managed Care, Other (non HMO) | Admitting: Nurse Practitioner

## 2021-09-25 DIAGNOSIS — L02219 Cutaneous abscess of trunk, unspecified: Secondary | ICD-10-CM | POA: Diagnosis not present

## 2021-09-25 DIAGNOSIS — L03319 Cellulitis of trunk, unspecified: Secondary | ICD-10-CM | POA: Diagnosis not present

## 2021-09-25 DIAGNOSIS — N6082 Other benign mammary dysplasias of left breast: Secondary | ICD-10-CM

## 2021-09-25 MED ORDER — HYDROCODONE-ACETAMINOPHEN 5-325 MG PO TABS: 1.0000 | ORAL_TABLET | Freq: Four times a day (QID) | ORAL | 0 refills | Status: DC | PRN

## 2021-09-25 MED ORDER — TRAMADOL HCL 50 MG PO TABS: 25.0000 mg | ORAL_TABLET | Freq: Four times a day (QID) | ORAL | 0 refills | Status: AC | PRN

## 2021-09-25 NOTE — Telephone Encounter (Signed)
Pt called in states pharmacy,   [id number] [place], [place] doesn't have the HYDROcodone-acetaminophen (NORCO) 5-325 MG tablet . She is looking for somewhere to get it. Please call back. ?

## 2021-09-25 NOTE — Assessment & Plan Note (Signed)
Refer to sebaceous cyst plan of care. ?

## 2021-09-25 NOTE — Progress Notes (Signed)
? ?BP 123/79   Pulse 83   Temp 98.4 ?F (36.9 ?C) (Oral)   Ht '5\' 6"'$  (1.676 m)   Wt 274 lb 9.6 oz (124.6 kg)   SpO2 97%   BMI 44.32 kg/m?   ? ?Subjective:  ? ? Patient ID: Lori Knight, female    DOB: 09/04/1970, 51 y.o.   MRN: [id number] ? ?HPI: ?Lori Knight is a 51 y.o. female ? ?Chief Complaint  ?Patient presents with  ? Cyst  ?  Patient states she is having issues with her wound on the area of her breast. Patient states she was informed to have her provider take a look at the area as their office closed at 3 pm. Patient states she was told that silver nitrate was used in the area and she is not sure if what she see is residue from the silver nitrate. Patient states it has been tenderness in the area and states it has not been tender since the first couple of days.   ? ?NOTE WRITTEN BY [employer] DNP STUDENT.  ASSESSMENT AND PLAN OF CARE REVIEWED WITH STUDENT, AGREE WITH ABOVE FINDINGS AND PLAN.  ? ?SKIN INFECTION ?Sebaceous cyst under left breast, drained by Dr. Christian Mate on 09/03/21.  Was initially treated with Doxycycline and Mupirocin to area by PCP.  Recent f/u with general surgery on 09/24/21, they continue to monitor area as a small sinus tract remains.  Returns to see surgery on 10/01/21. ?Duration: patient first noticed the cyst around 2-3 months ago ?Location: under left breast ?History of trauma in area: no ?Pain: yes ?Quality:  tender, aching, sore ?Severity: 3/10 ?Redness: yes ?Swelling: no ?Oozing: yes ?Pus: yes ?Fevers: no ?Nausea/vomiting: no ?Status:  improving ?Treatments attempted:antibiotics  ?Tetanus:  not up to date   ? ?Relevant past medical, surgical, family and social history reviewed and updated as indicated. Interim medical history since our last visit reviewed. ?Allergies and medications reviewed and updated. ? ?Review of Systems  ?Constitutional:  Negative for chills and fever.  ?Cardiovascular: Negative.   ?Gastrointestinal:  Negative for vomiting.  ?Skin:  Positive for color change  and wound.  ? ?Per HPI unless specifically indicated above ? ?   ?Objective:  ?  ?BP 123/79   Pulse 83   Temp 98.4 ?F (36.9 ?C) (Oral)   Ht '5\' 6"'$  (1.676 m)   Wt 274 lb 9.6 oz (124.6 kg)   SpO2 97%   BMI 44.32 kg/m?   ?Wt Readings from Last 3 Encounters:  ?09/25/21 274 lb 9.6 oz (124.6 kg)  ?09/24/21 275 lb 12.8 oz (125.1 kg)  ?09/10/21 272 lb (123.4 kg)  ?  ?Physical Exam ?Exam conducted with a chaperone present.  ?Constitutional:   ?   General: She is not in acute distress. ?   Appearance: Normal appearance. She is well-groomed. She is not ill-appearing or toxic-appearing.  ?HENT:  ?   Head: Normocephalic.  ?Eyes:  ?   General: Lids are normal. No scleral icterus. ?   Conjunctiva/sclera: Conjunctivae normal.  ?   Pupils: Pupils are equal, round, and reactive to light.  ?Cardiovascular:  ?   Rate and Rhythm: Normal rate and regular rhythm.  ?Pulmonary:  ?   Effort: Pulmonary effort is normal. No accessory muscle usage or respiratory distress.  ?   Breath sounds: Normal breath sounds.  ?Chest:  ?   Chest wall: Tenderness present. No mass, lacerations or deformity.  ?Breasts: ?   Left: Tenderness present.  ? ? ?   Comments:  Under left breast ?Abdominal:  ?   Palpations: Abdomen is soft.  ?Musculoskeletal:     ?   General: No swelling.  ?   Cervical back: Normal range of motion.  ?Skin: ?   General: Skin is warm.  ?   Findings: Lesion and wound present.  ?   Comments: From cyst drainage under left breast  ?Neurological:  ?   Mental Status: She is alert and oriented to person, place, and time.  ?Psychiatric:     ?   Mood and Affect: Mood and affect normal.     ?   Behavior: Behavior normal. Behavior is cooperative.     ?   Thought Content: Thought content normal.     ?   Judgment: Judgment normal.  ? ? ?Results for orders placed or performed in visit on 09/03/21  ?Anaerobic and Aerobic Culture  ? Specimen: Abscess  ? Abscess Sebaceous cyst  ?Result Value Ref Range  ? Anaerobic Culture Final report (A)   ? Result 1  Comment (A)   ? Aerobic Culture Final report   ? Result 1 Mixed skin flora   ? ?   ?Assessment & Plan:  ? ?Problem List Items Addressed This Visit   ? ?  ? Musculoskeletal and Integument  ? Sebaceous cyst of breast, left  ?  Underneath left breast. S/p cyst drainage by gen surgery. Patient was concerned about the appearance of the wound. States area is painful and tender to touch. large area of dark staining due to silver nitrate noted, wound is approximately 3 cm round, pale pink in color with small white sloughing noted in the center. Patient has a follow up visit with gen surgery in around one week. Hydrocodone-acetaminophen 5-325 mg ordered for pain. Patient instructed to take pain medication as needed prior to cleansing the area.    ?  ?  ?  ? Other  ? Cellulitis and abscess of trunk  ?  Refer to sebaceous cyst plan of care. ?  ?  ?  ? ?Follow up plan: ?Return for as scheduled in May, sooner if worsening.. ? ? ? ? ? ?

## 2021-09-25 NOTE — Assessment & Plan Note (Signed)
Underneath left breast. S/p cyst drainage by gen surgery. Patient was concerned about the appearance of the wound. States area is painful and tender to touch. large area of dark staining due to silver nitrate noted, wound is approximately 3 cm round, pale pink in color with small white sloughing noted in the center. Patient has a follow up visit with gen surgery in around one week. Hydrocodone-acetaminophen 5-325 mg ordered for pain. Patient instructed to take pain medication as needed prior to cleansing the area.    ?

## 2021-09-25 NOTE — Assessment & Plan Note (Deleted)
Underneath left breast. S/p cyst drainage by gen surgery. Patient was concerned about the appearance of the wound. States area is painful and tender to touch. large area of dark staining due to silver nitrate noted, wound is approximately 3 cm round, pale pink in color with small white sloughing noted in the center. Patient has a follow up visit with gen surgery in around one week. Hydrocodone-acetaminophen 5-325 mg ordered for pain. Patient instructed to take pain medication as needed prior to cleansing the area.    ?

## 2021-09-28 ENCOUNTER — Encounter: Payer: Self-pay | Admitting: Nurse Practitioner

## 2021-10-01 ENCOUNTER — Ambulatory Visit: Payer: Managed Care, Other (non HMO) | Admitting: Surgery

## 2021-10-01 ENCOUNTER — Encounter: Payer: Self-pay | Admitting: Surgery

## 2021-10-01 ENCOUNTER — Other Ambulatory Visit: Payer: Self-pay

## 2021-10-01 VITALS — BP 143/97 | HR 81 | Temp 98.2°F | Ht 66.0 in | Wt 274.2 lb

## 2021-10-01 DIAGNOSIS — L929 Granulomatous disorder of the skin and subcutaneous tissue, unspecified: Secondary | ICD-10-CM | POA: Diagnosis not present

## 2021-10-01 NOTE — Progress Notes (Signed)
[facility] ?POST-OP OFFICE VISIT ? ?10/01/2021 ? ?HPI: ?Lori Knight is a 51 y.o. female who is performing Q-tip probing as instructed of her left inframammary abscess wound.  S/p I&D.  She reports the wound is progressively smaller, she reports she has a poor pain threshold, and nearly made herself pass out.  However she is making progress.  Denies fevers and chills. ? ?Vital signs: ?BP (!) 143/97   Pulse 81   Temp 98.2 ?F (36.8 ?C) (Oral)   Ht '5\' 6"'$  (1.676 m)   Wt 274 lb 3.2 oz (124.4 kg)   SpO2 98%   BMI 44.26 kg/m?   ? ?Physical Exam: ?Constitutional: She appears well. ? ?Skin: Left inframammary site a small punctate wound still about the size of a silver nitrate tip, I was able to probe the wound and it does remain quite small, she did tolerate this treatment.  I believe she may have difficulty getting a Q-tip in sufficiently but I advised that she continue to cleanse it twice daily with hydrogen peroxide aggressively, and anticipate will get the healing for looking for.  There is no surrounding evidence of inflammation or erythema or induration. ? ?Assessment/Plan: ?This is a 51 y.o. female s/p I&D left inframammary abscess. ? ?Patient Active Problem List  ? Diagnosis Date Noted  ? Cellulitis and abscess of trunk 09/03/2021  ? Sebaceous cyst of breast, left 09/03/2021  ? Nasal congestion 08/18/2021  ? RLQ abdominal pain 04/16/2021  ? Aortic atherosclerosis ([place]) 09/30/2020  ? Type 2 diabetes mellitus with proteinuria ([place]) 02/20/2020  ? Meningioma ([place]) 12/24/2019  ? Hyperlipidemia associated with type 2 diabetes mellitus ([place]) 08/13/2019  ? Gastroesophageal reflux disease without esophagitis 06/20/2019  ? Type 2 diabetes mellitus with morbid obesity ([place]) 12/01/2015  ? OSA on CPAP 09/22/2015  ? Insomnia 08/28/2015  ? Internal hemorrhoids 08/28/2015  ? Vitamin D deficiency 08/28/2015  ? Obesity, Class III, BMI 40-49.9 (morbid obesity) ([place]) 08/27/2015  ? Hypertension associated with  diabetes ([place]) 02/14/2014  ? Benign paroxysmal positional nystagmus 02/14/2014  ? Benign neoplasm of kidney 08/14/2012  ? Bipolar I disorder, single manic episode ([place]) 08/14/2012  ? Adult hypothyroidism 06/21/2012  ? Bilateral polycystic ovarian syndrome 06/21/2012  ? ? -She may follow-up with [place] in 2 to 3 weeks or as needed.  I believe there is a good chance she will need no further follow-up.  We will be glad to see her regardless ? ? ?Ronny Bacon M.D., FACS ?10/01/2021, 10:49 AM ? ?

## 2021-10-01 NOTE — Patient Instructions (Signed)
Continue to probe with Q-tip. Apply warm compresses. Please call the office if you think we need to take a look at the area.  ?

## 2021-10-12 ENCOUNTER — Ambulatory Visit (INDEPENDENT_AMBULATORY_CARE_PROVIDER_SITE_OTHER): Payer: Managed Care, Other (non HMO)

## 2021-10-12 ENCOUNTER — Ambulatory Visit
Admission: RE | Admit: 2021-10-12 | Discharge: 2021-10-12 | Disposition: A | Discharge status: Home / Self Care | Payer: Managed Care, Other (non HMO) | Source: Ambulatory Visit | Attending: Emergency Medicine | Admitting: Emergency Medicine

## 2021-10-12 VITALS — BP 114/85 | HR 89 | Temp 98.5°F | Resp 20

## 2021-10-12 DIAGNOSIS — J069 Acute upper respiratory infection, unspecified: Secondary | ICD-10-CM | POA: Diagnosis not present

## 2021-10-12 DIAGNOSIS — R0602 Shortness of breath: Secondary | ICD-10-CM | POA: Diagnosis not present

## 2021-10-12 DIAGNOSIS — R062 Wheezing: Secondary | ICD-10-CM | POA: Diagnosis not present

## 2021-10-12 DIAGNOSIS — J40 Bronchitis, not specified as acute or chronic: Secondary | ICD-10-CM

## 2021-10-12 MED ORDER — DOXYCYCLINE HYCLATE 100 MG PO CAPS: 100.0000 mg | ORAL_CAPSULE | Freq: Two times a day (BID) | ORAL | 0 refills | Status: DC

## 2021-10-12 MED ORDER — IPRATROPIUM BROMIDE 0.06 % NA SOLN: 2.0000 | Freq: Four times a day (QID) | NASAL | 12 refills | Status: DC

## 2021-10-12 MED ORDER — PROMETHAZINE-DM 6.25-15 MG/5ML PO SYRP: 5.0000 mL | ORAL_SOLUTION | Freq: Four times a day (QID) | ORAL | 0 refills | Status: DC | PRN

## 2021-10-12 MED ORDER — BENZONATATE 100 MG PO CAPS: 200.0000 mg | ORAL_CAPSULE | Freq: Three times a day (TID) | ORAL | 0 refills | Status: DC

## 2021-10-12 NOTE — ED Provider Notes (Signed)
?[facility] ? ? ? ?CSN: [id number] ?Arrival date & time: 10/12/21  0855 ? ? ?  ? ?History   ?Chief Complaint ?Chief Complaint  ?Patient presents with  ? Cough  ?  0900  ? Wheezing  ?  I am pretty sure I have bronchitis. I am wheezing and crackling. - Entered by patient  ? ? ?HPI ?Lori Knight is a 51 y.o. female.  ? ?HPI ?51 year old female here for evaluation of respiratory complaints. ? ?Patient more symptoms began 4 days ago and they consist of both upper and lower respiratory complaints.  She endorses nasal congestion with a runny nose for clear to yellow discharge.  She has been using a Nettie pot to help facilitate removal of the mucus burden.  She is also complaining of pain in her right ear and a sore throat.  She states her sore throat is greater than her ear pain.  She does have an intermittently productive cough for yellow sputum and scant amounts.  She also endorses shortness of breath wheezing and diarrhea.  She has been checking her fever at home but has never measured a fever.  She denies any nausea or vomiting.  Patient has a significant past medical history to include hypertension, hypothyroidism, PCOS, type 2 diabetes, and obesity. ? ?Past Medical History:  ?Diagnosis Date  ? Adult hypothyroidism 06/21/2012  ? Anxiety   ? Back pain   ? Benign paroxysmal positional nystagmus   ? Bilateral polycystic ovarian syndrome 06/21/2012  ? Biliary calculus with cholecystitis 08/14/2012  ? Bipolar 1 disorder ([place])   ? BP (high blood pressure) 02/14/2014  ? Bronchitis   ? Constipation   ? COVID-19 07/2020  ? Depression   ? Diabetes mellitus ([place]) 08/14/2012  ? Diabetes mellitus without complication ([place])   ? pre-diabetic  ? Dysrhythmia   ? wore heart monitor 2016. Corrected by changing Levothyroxine dose.  ? Edema of both lower extremities   ? Gallbladder problem   ? GERD (gastroesophageal reflux disease)   ? Heart murmur   ? followed by PCP-AS A CHILD-ASYMPTOMATIC  ? Hyperlipidemia   ?  Hyperprolactinemia ([place]) 06/21/2012  ? Hypertension   ? Hypothyroid   ? Hypoxia 04/17/2018  ? Joint pain   ? Kidney problem   ? Meningioma ([place])   ? Motion sickness   ? carnival rides  ? Neuropathy   ? PONV (postoperative nausea and vomiting)   ? Low BP after sinus surgery. WAKES UP CRYING  ? Shortness of breath dyspnea   ? stairs. related to wt.  ? Sleep apnea   ? has CPAP. has not used since 2011  ? Swallowing difficulty   ? Vertigo 2016  ? none recently  ? Vitamin D deficiency   ? ? ?Patient Active Problem List  ? Diagnosis Date Noted  ? Cellulitis and abscess of trunk 09/03/2021  ? Sebaceous cyst of breast, left 09/03/2021  ? Nasal congestion 08/18/2021  ? RLQ abdominal pain 04/16/2021  ? Aortic atherosclerosis ([place]) 09/30/2020  ? Type 2 diabetes mellitus with proteinuria ([place]) 02/20/2020  ? Meningioma ([place]) 12/24/2019  ? Hyperlipidemia associated with type 2 diabetes mellitus ([place]) 08/13/2019  ? Gastroesophageal reflux disease without esophagitis 06/20/2019  ? Type 2 diabetes mellitus with morbid obesity ([place]) 12/01/2015  ? OSA on CPAP 09/22/2015  ? Insomnia 08/28/2015  ? Internal hemorrhoids 08/28/2015  ? Vitamin D deficiency 08/28/2015  ? Obesity, Class III, BMI 40-49.9 (morbid obesity) ([place]) 08/27/2015  ? Hypertension associated with diabetes ([place])  02/14/2014  ? Benign paroxysmal positional nystagmus 02/14/2014  ? Benign neoplasm of kidney 08/14/2012  ? Bipolar I disorder, single manic episode ([place]) 08/14/2012  ? Adult hypothyroidism 06/21/2012  ? Bilateral polycystic ovarian syndrome 06/21/2012  ? ? ?Past Surgical History:  ?Procedure Laterality Date  ? CHOLECYSTECTOMY    ? COLONOSCOPY WITH PROPOFOL N/A 06/19/2015  ? Procedure: COLONOSCOPY WITH PROPOFOL;  Surgeon: [name], MD;  Location: [facility];  Service: Endoscopy;  Laterality: N/A;  Diabetic - oral meds ?  ? DENTAL SURGERY    ? ESOPHAGOGASTRODUODENOSCOPY (EGD) WITH PROPOFOL N/A 01/16/2021  ? Procedure: ESOPHAGOGASTRODUODENOSCOPY (EGD)  WITH PROPOFOL;  Surgeon: [name], MD;  Location: [facility];  Service: Endoscopy;  Laterality: N/A;  Diabetic - oral meds  ? ETHMOIDECTOMY Bilateral 04/17/2018  ? Procedure: ETHMOIDECTOMY;  Surgeon: [name], MD;  Location: [facility];  Service: ENT;  Laterality: Bilateral;  ? GALLBLADDER SURGERY    ? IMAGE GUIDED SINUS SURGERY N/A 04/17/2018  ? Procedure: IMAGE GUIDED SINUS SURGERY;  Surgeon: [name], MD;  Location: [facility];  Service: ENT;  Laterality: N/A;  ? MAXILLARY ANTROSTOMY Bilateral 04/17/2018  ? Procedure: MAXILLARY ANTROSTOMY;  Surgeon: [name], MD;  Location: [facility];  Service: ENT;  Laterality: Bilateral;  ? NASAL SEPTOPLASTY W/ TURBINOPLASTY Bilateral 04/17/2018  ? Procedure: NASAL SEPTOPLASTY WITH TURBINATE REDUCTION;  Surgeon: [name], MD;  Location: [facility];  Service: ENT;  Laterality: Bilateral;  ? TOTAL ABDOMINAL HYSTERECTOMY  2012  ? cervical dysplasia/ovaries remian  ? TRANSURETHRAL RESECTION OF BLADDER TUMOR N/A 04/17/2018  ? Procedure: NTRANSURETHRAL RESECTION OF BLADDER TUMOR (TURBT) WITH GEMCITABINE;  Surgeon: [name], MD;  Location: [facility];  Service: Urology;  Laterality: N/A;  ? ? ?OB History   ? ? Gravida  ?1  ? Para  ?1  ? Term  ?   ? Preterm  ?   ? AB  ?   ? Living  ?   ?  ? ? SAB  ?   ? IAB  ?   ? Ectopic  ?   ? Multiple  ?   ? Live Births  ?   ?   ?  ?  ? ? ? ?Home Medications   ? ?Prior to Admission medications   ?Medication Sig Start Date End Date Taking? Authorizing Provider  ?benzonatate (TESSALON) 100 MG capsule Take 2 capsules (200 mg total) by mouth every 8 (eight) hours. 10/12/21  Yes Margarette Canada, NP  ?doxycycline (VIBRAMYCIN) 100 MG capsule Take 1 capsule (100 mg total) by mouth 2 (two) times daily. 10/12/21  Yes Margarette Canada, NP  ?ipratropium (ATROVENT) 0.06 % nasal spray Place 2 sprays into both nostrils 4 (four) times daily. 10/12/21  Yes Margarette Canada, NP  ?promethazine-dextromethorphan (PROMETHAZINE-DM) 6.25-15 MG/5ML syrup Take  5 mLs by mouth 4 (four) times daily as needed. 10/12/21  Yes Margarette Canada, NP  ?acetaminophen (TYLENOL) 500 MG tablet Take 500 mg by mouth every 6 (six) hours as needed.    [provider]  ?amLODipine (NORVASC) 5 MG tablet Take 1 tablet (5 mg total) by mouth daily. 07/16/21   [name], NP  ?Ascorbic Acid (VITAMIN C) 1000 MG tablet Take 1,000 mg by mouth daily.    [provider]  ?atorvastatin (LIPITOR) 20 MG tablet Take 1 tablet (20 mg total) by mouth daily. 07/17/21   [name], NP  ?Cholecalciferol 5000 units capsule Take 1 capsule (5,000 Units total) by mouth daily. 11/28/15   Plonk,  Gwyndolyn Saxon, MD  ?CONTOUR NEXT TEST test strip USE TO CHECK BS UP TO 3 TIMES DAILY FOR DIABETES DX: E11.9 06/13/19   [name], MD  ?Dulaglutide (TRULICITY) 3 DP/8.2UM SOPN Inject 3 mg as directed once a week. 07/16/21   [name], NP  ?lamoTRIgine (LAMICTAL) 150 MG tablet Take 300 mg by mouth daily. 07/03/20   [provider]  ?levothyroxine (SYNTHROID) 150 MCG tablet Take 1 tablet (150 mcg total) by mouth daily. 07/17/21   Cannady, Henrine Screws T, NP  ?losartan (COZAAR) 50 MG tablet Take 2 tablets (100 mg total) by mouth daily. 07/16/21   [name], NP  ?metFORMIN (GLUCOPHAGE) 500 MG tablet Take 2 tablets (1,000 mg total) by mouth 2 (two) times daily. 09/07/21   [name], NP  ?mometasone (ELOCON) 0.1 % cream SMARTSIG:1 Application Topical 3/53/61   [provider]  ?mupirocin ointment (BACTROBAN) 2 % Apply 1 application topically 2 (two) times daily. 09/02/21   [name], NP  ?nitrofurantoin, macrocrystal-monohydrate, (MACROBID) 100 MG capsule Take 100 mg by mouth 2 (two) times daily. 09/28/21   [provider]  ?Omega-3 Fatty Acids (FISH OIL) 1200 MG CAPS Take 1,200 mg by mouth daily.     [provider]  ?omeprazole (PRILOSEC) 40 MG capsule TAKE 1 CAPSULE BY MOUTH EVERY DAY 07/17/21   [name], NP  ? ? ?Family History ?Family History   ?Problem Relation Age of Onset  ? Diabetes Maternal Grandmother   ? Hypertension Maternal Grandmother   ? Hyperlipidemia Maternal Grandmother   ? Hypothyroidism Maternal Grandmother   ? Heart disease Maternal Grandmo

## 2021-10-12 NOTE — Discharge Instructions (Addendum)
Take the doxycycline twice daily for 10 days for treatment of your bronchitis. ? ?Use the Atrovent nasal spray, 2 squirts up each nostril every 6 hours, time with runny nose, nasal congestion, and postnasal drip. ? ?Use the Tessalon Perles every 8 hours for your cough.  Taken with a small sip of water.  They may give you some numbness to the base of your tongue or metallic taste in her mouth, this is normal.  They are designed to calm down the cough reflex. ? ?Use the Promethazine DM cough syrup at bedtime as will make you drowsy.  You may take 1 teaspoon (5 mL) every 6 hours. ? ?Return for reevaluation for new or worsening symptoms.  ?

## 2021-10-12 NOTE — ED Triage Notes (Signed)
Pt reports cough, congestion, wheezing and crackling x 4 days. Pt states she has  bronchitis. NyQuil helped to sleep.  ?

## 2021-10-21 ENCOUNTER — Ambulatory Visit (INDEPENDENT_AMBULATORY_CARE_PROVIDER_SITE_OTHER): Payer: 59 | Admitting: Psychology

## 2021-10-21 DIAGNOSIS — F309 Manic episode, unspecified: Secondary | ICD-10-CM | POA: Diagnosis not present

## 2021-10-21 NOTE — Progress Notes (Signed)
[facility] Counselor/Therapist Progress Note ? ?Patient ID: Lori Knight, MRN: [id number],   ? ?Date: 10/21/2021 ? ?Time Spent: 1:32 pm - 2:25 pm:  53 minutes ? ?Treatment Type: Individual Therapy. ? ?Reported Symptoms: Anxiety  ? ?Mental Status Exam: ?Appearance:  Neat and Well Groomed     ?Behavior: Appropriate  ?Motor: Normal  ?Speech/Language:  Clear and Coherent and Normal Rate  ?Affect: Congruent  ?Mood: dysthymic  ?Thought process: normal  ?Thought content:   WNL  ?Sensory/Perceptual disturbances:   WNL  ?Orientation: oriented to person, place, time/date, and situation  ?Attention: Good  ?Concentration: Good  ?Memory: WNL  ?Fund of knowledge:  Good  ?Insight:   Good  ?Judgment:  Good  ?Impulse Control: Good  ? ?Subjective:  ? ?Lori Knight participated home, via video, and consented to treatment. Therapist participated from home office.We met online due to [place] pandemic. Lori Knight reviewed the events of the past two weeks. Lori Knight noted feeling ill during the past week but noted feeling better. She noted an upcoming move of a friend who she labeled as a gossip. We explored possible boundaries, going forward, to address any possible past concern should it arise. She noted a need for additional social support, a best friend, but noted difficulty making friends as an adult. We explored this during the session and discussed ways to become more social going forward. Therapist praised Lori Knight for her mindfulness of possible upcoming stressors and discussing these during the session. Therapist provided supportive therapy.  ? ?Interventions: Interpersonal ? ?Diagnosis:Manic disorder, single episode ([place]) ? ? ?Plan: Patient is to use CBT, ACT, BA, mindfulness, and coping skills to help manage decrease symptoms associated with their diagnosis. (Target Date 01/05/22) ?  ?Long-term goal:   ?Reduce overall level, frequency, and intensity of the feelings of depression and anxiety evidenced by negative  self-talk, rumination, and poor self-esteem  from 6 to 7 days/week to 0 to 1 days/week per client report for at least 3 consecutive months. ? ?Short-term goal:  ?Verbally express understanding of the relationship between feelings of depression, anxiety and their impact on thinking patterns and behaviors. ? ?Verbalize an understanding of the role that distorted thinking plays in creating fears, excessive worry, and ruminations. ? ?(Lori Knight participated in the creation of the treatment plan) ? ?[name], LCSW ?

## 2021-11-05 ENCOUNTER — Ambulatory Visit (INDEPENDENT_AMBULATORY_CARE_PROVIDER_SITE_OTHER): Payer: 59 | Admitting: Psychology

## 2021-11-05 DIAGNOSIS — F309 Manic episode, unspecified: Secondary | ICD-10-CM | POA: Diagnosis not present

## 2021-11-05 NOTE — Progress Notes (Signed)
[facility] Counselor/Therapist Progress Note ? ?Patient ID: Lori Knight, MRN: [id number],   ? ?Date: 11/05/2021 ? ?Time Spent: 8:00 am - 8:40 am:  40 minutes ? ?Treatment Type: Individual Therapy. ? ?Reported Symptoms: Anxiety  ? ?Mental Status Exam: ?Appearance:  Neat and Well Groomed     ?Behavior: Appropriate  ?Motor: Normal  ?Speech/Language:  Clear and Coherent and Normal Rate  ?Affect: Congruent  ?Mood: dysthymic  ?Thought process: normal  ?Thought content:   WNL  ?Sensory/Perceptual disturbances:   WNL  ?Orientation: oriented to person, place, time/date, and situation  ?Attention: Good  ?Concentration: Good  ?Memory: WNL  ?Fund of knowledge:  Good  ?Insight:   Good  ?Judgment:  Good  ?Impulse Control: Good  ? ?Subjective:  ? ?[name] participated home, via video, and consented to treatment. Therapist participated from home office.We met online due to [place] pandemic. [name] reviewed the events of the past two weeks. [name] noted her efforts to engage and maintain her organization for the past few weeks. She noted her upcoming medication change of lamotrigine 345m to 2530mwith her psychiatrist's instruction. She noted some short-term memory loss, which necessitated the medication change. She noted an upcoming transition, her daughter moving out and taking the dog, [name], who PaFradyoted being attached to. We processed this upcoming transition and the effect of this on her overall mood. She noted on working on work related boundaries and has found that to be successful. She noted eating healthfully and noted maintaining her overall weight. She is not currently following her diet, as prescribed. She discussed a lack of exercise and noted having all the tools necessary. She noted all or nothing in relation to exercise as a significant barrier to consistent engagement. We will process this going forward. Therapist praised PaAnjulieor effort and energy and scheduled a follow-up  appointment.  ? ?Interventions: Interpersonal ? ?Diagnosis:Bipolar I disorder, single manic episode ([place]? ? ?Plan: Patient is to use CBT, ACT, BA, mindfulness, and coping skills to help manage decrease symptoms associated with their diagnosis. (Target Date 01/05/22) ?  ?Long-term goal:   ?Reduce overall level, frequency, and intensity of the feelings of depression and anxiety evidenced by negative self-talk, rumination, and poor self-esteem  from 6 to 7 days/week to 0 to 1 days/week per client report for at least 3 consecutive months. ? ?Short-term goal:  ?Verbally express understanding of the relationship between feelings of depression, anxiety and their impact on thinking patterns and behaviors. ? ?Verbalize an understanding of the role that distorted thinking plays in creating fears, excessive worry, and ruminations. ? ?(PaTalinarticipated in the creation of the treatment plan) ? ?OsBuena IrishLCSW ?

## 2021-11-15 NOTE — Patient Instructions (Signed)

## 2021-11-17 ENCOUNTER — Encounter: Payer: Self-pay | Admitting: Nurse Practitioner

## 2021-11-17 ENCOUNTER — Ambulatory Visit (INDEPENDENT_AMBULATORY_CARE_PROVIDER_SITE_OTHER): Payer: Commercial Managed Care - HMO | Admitting: Nurse Practitioner

## 2021-11-17 DIAGNOSIS — K219 Gastro-esophageal reflux disease without esophagitis: Secondary | ICD-10-CM

## 2021-11-17 DIAGNOSIS — R809 Proteinuria, unspecified: Secondary | ICD-10-CM

## 2021-11-17 DIAGNOSIS — E039 Hypothyroidism, unspecified: Secondary | ICD-10-CM

## 2021-11-17 DIAGNOSIS — I152 Hypertension secondary to endocrine disorders: Secondary | ICD-10-CM

## 2021-11-17 DIAGNOSIS — R1310 Dysphagia, unspecified: Secondary | ICD-10-CM

## 2021-11-17 DIAGNOSIS — E66813 Obesity, class 3: Secondary | ICD-10-CM

## 2021-11-17 DIAGNOSIS — E1129 Type 2 diabetes mellitus with other diabetic kidney complication: Secondary | ICD-10-CM | POA: Diagnosis not present

## 2021-11-17 DIAGNOSIS — E1159 Type 2 diabetes mellitus with other circulatory complications: Secondary | ICD-10-CM

## 2021-11-17 DIAGNOSIS — R1031 Right lower quadrant pain: Secondary | ICD-10-CM

## 2021-11-17 DIAGNOSIS — I700 Atherosclerosis of aorta: Secondary | ICD-10-CM

## 2021-11-17 DIAGNOSIS — F309 Manic episode, unspecified: Secondary | ICD-10-CM | POA: Diagnosis not present

## 2021-11-17 DIAGNOSIS — D329 Benign neoplasm of meninges, unspecified: Secondary | ICD-10-CM

## 2021-11-17 DIAGNOSIS — E1169 Type 2 diabetes mellitus with other specified complication: Secondary | ICD-10-CM

## 2021-11-17 DIAGNOSIS — E785 Hyperlipidemia, unspecified: Secondary | ICD-10-CM

## 2021-11-17 DIAGNOSIS — G4733 Obstructive sleep apnea (adult) (pediatric): Secondary | ICD-10-CM

## 2021-11-17 LAB — BAYER DCA HB A1C WAIVED: HB A1C (BAYER DCA - WAIVED): 6.2 % — ABNORMAL HIGH (ref 4.8–5.6)

## 2021-11-17 MED ORDER — EPINEPHRINE 0.3 MG/0.3ML IJ SOAJ: 0.3000 mg | INTRAMUSCULAR | 4 refills | Status: AC | PRN

## 2021-11-17 NOTE — Progress Notes (Signed)
? ?BP 134/87   Pulse 80   Temp 98.3 ?F (36.8 ?C) (Oral)   Ht '5\' 6"'$  (1.676 m)   Wt 278 lb 12.8 oz (126.5 kg)   SpO2 97%   BMI 45.00 kg/m?   ? ?Subjective:  ? ? Patient ID: Lori Knight, female    DOB: 05/16/1971, 51 y.o.   MRN: [id number] ? ?HPI: ?Lori Knight is a 51 y.o. female ? ?Chief Complaint  ?Patient presents with  ? Diabetes  ? Hyperlipidemia  ? Hypertension  ? Hypothyroidism  ? Mood  ? Oral Swelling  ?  Patient states the past 2 weeks, she has notices her inside of her throat is swelling and she says feels like when she goes to swallow it feels like a straw with the narrowing opening. Patient has tried Benadryl one night and it made it her sleep so hard that she has not taken it any more. Patient states she notices it more when she is trying to drink or chew.  ? ?DYSPHAGIA: ?Over past 2 weeks feels like inside of throat.  Depends on how is sitting sometimes.  Not sore or scratchy, feels swollen.  When she tries to eat or drink food is touching all the way down.  Feels food and drink touching. Only changes has been eating more popcorn and getting new cough.  Notices all day.   ?Duration: 2 weeks ?Description of symptom: feels like food is touching/tight ?Onset: Immediately upon swallowing ?Location of dysphagia: throat ?Dysphagia to solids only: no ?Dysphagia to solids & liquids: yes  ?Frequency:constant  ?Progressively getting worse: no ?Alleviatiating factors: nothing ?Provoking factors:  unknown ?Status: fluctuating ?EGD: yes = 01/16/21 ?Weight loss: no ?Sensation of lump in throat: no ?Heartburn: yes ?Odynophagia: no ?Nausea: yes in mornings when sits up ?Vomiting: no ?Drooling/nasal regurgitation/food spillage: no ?Coughing/choking/dysphonia: no ?Dysarthria: no ?Hematemesis: no ?Regurgitation of undigested food/halitosis: no ?Chest pain: no  ?  ?DIABETES ?A1c in February 6.3%. Continues on Metformin 6712 MG BID and Trulicity 3 MG weekly. Her endo provider NP [place] left practice and she has  not been satisfied with current provider, will be staying with PCP at this time. ?Hypoglycemic episodes:no ?Polydipsia/polyuria: occasional ?Visual disturbance: no ?Chest pain: no ?Paresthesias: no ?Glucose Monitoring: no ?            Accucheck frequency: BID ?            Fasting glucose: 150-160 in morning ?            Post prandial: in green after meals -- <180 ?            Evening: ?            Before meals: ?Taking Insulin?: no ?            Long acting insulin: ?            Short acting insulin: ?Blood Pressure Monitoring: not checking ?Retinal Examination: Up to Date ?Foot Exam: Up to Date ?Diabetic Education: Completed ?Pneumovax: Up to Date ?Influenza: Up to Date ?Aspirin: no  ?  ?HYPOTHYROIDISM ?Continues on Levothyroxine 150 MCG daily. ?Thyroid control status:stable ?Satisfied with current treatment? yes ?Medication side effects: no ?Medication compliance: good compliance ?Etiology of hypothyroidism:  ?Recent dose adjustment: yes ?Fatigue: no ?Cold intolerance: no ?Heat intolerance: no ?Weight gain: no ?Weight loss: no ?Constipation: occasional ?Diarrhea/loose stools: no ?Palpitations: no ?Lower extremity edema: no ?Anxiety/depressed mood: no ?  ?HYPERTENSION / HYPERLIPIDEMIA ?Continues on Losartan 100 MG, Amlodipine 5 MG  and Atorvastatin 20 MG QHS. Her last echo in 2019 noted EF 50-55%.  Aortic atherosclerosis noted on past imaging. ?  ?Sees neurosurgery, Dr. Lacinda Axon, for follow-up meningioma which remains stable.  She is returning to him next year -- every 24 month follow up visits. ?Duration of hypertension: chronic ?BP monitoring frequency: not checking ?BP range:  ?BP medication side effects: no ?Duration of hyperlipidemia: chronic ?Aspirin: no ?Recent stressors: no ?Recurrent headaches: no ?Visual changes: no ?Palpitations: no ?Dyspnea: no ?Chest pain: no ?Lower extremity edema: no ?Dizzy/lightheaded: no  ?The 10-year ASCVD risk score (Arnett DK, et al., 2019) is: 5.4% ?  Values used to calculate the  score: ?    Age: 51 years ?    Sex: Female ?    Is Non-Hispanic African American: No ?    Diabetic: Yes ?    Tobacco smoker: No ?    Systolic Blood Pressure: 655 mmHg ?    Is BP treated: Yes ?    HDL Cholesterol: 34 mg/dL ?    Total Cholesterol: 157 mg/dL ? ?BIPOLAR DISORDER ?Recently had Lamictal reduced by psychiatry -- was on 300 MG daily and have reduced to 250 MG daily due to brain fog.  Sees psychiatry, on [facility] -- Dr. Gloriajean Dell ([place]). ?Mood status: stable ?Satisfied with current treatment?: yes ?Symptom severity: moderate  ?Duration of current treatment : chronic ?Side effects: no ?Medication compliance: good compliance ?Psychotherapy/counseling: at present -- with Alene Mires ?Depressed mood: none ?Anxious mood: improving with stopping Topamax ?Anhedonia: no ?Significant weight loss or gain: no ?Insomnia: none ?Fatigue: yes ?Feelings of worthlessness or guilt: no ?Impaired concentration/indecisiveness: yes ?Suicidal ideations: no ?Hopelessness: no ?Crying spells: yes ? ?  11/17/2021  ?  2:28 PM 09/25/2021  ?  1:28 PM 08/18/2021  ?  3:25 PM 05/15/2021  ? 10:00 AM 11/05/2020  ?  3:18 PM  ?Depression screen PHQ 2/9  ?Decreased Interest 1 1 0 1 2  ?Down, Depressed, Hopeless 1 1 0 1 2  ?PHQ - 2 Score 2 2 0 2 4  ?Altered sleeping 1 0 0 0 2  ?Tired, decreased energy '1 1 1 1 2  '$ ?Change in appetite '1 1 1 1 2  '$ ?Feeling bad or failure about yourself  1 1 0 0 2  ?Trouble concentrating '2 1 1 2 1  '$ ?Moving slowly or fidgety/restless 0 0 '1 1 1  '$ ?Suicidal thoughts 0 0 0 0 0  ?PHQ-9 Score '8 6 4 7 14  '$ ?Difficult doing work/chores    Not difficult at all Somewhat difficult  ?  ? ?  11/17/2021  ?  2:28 PM 09/25/2021  ?  1:28 PM 08/18/2021  ?  3:25 PM 05/15/2021  ? 10:00 AM  ?GAD 7 : Generalized Anxiety Score  ?Nervous, Anxious, on Edge '1 1 1 1  '$ ?Control/stop worrying '1 1 1 1  '$ ?Worry too much - different things '1 1 1 1  '$ ?Trouble relaxing '1 1 1 1  '$ ?Restless 1 0 1 1  ?Easily annoyed or irritable '1 1 1 1  '$ ?Afraid - awful might happen 0 1 0 0   ?Total GAD 7 Score '6 6 6 6  '$ ?Anxiety Difficulty Not difficult at all Not difficult at all Not difficult at all Not difficult at all  ? ? ?Relevant past medical, surgical, family and social history reviewed and updated as indicated. Interim medical history since our last visit reviewed. ?Allergies and medications reviewed and updated. ? ?Review of Systems  ?Constitutional:  Negative for activity  change, appetite change, diaphoresis, fatigue and fever.  ?Respiratory:  Negative for cough, chest tightness and shortness of breath.   ?Cardiovascular:  Negative for chest pain, palpitations and leg swelling.  ?Gastrointestinal: Negative.   ?Endocrine: Negative for cold intolerance, heat intolerance, polydipsia, polyphagia and polyuria.  ?Neurological: Negative.   ?Psychiatric/Behavioral: Negative.    ? ?Per HPI unless specifically indicated above ? ?   ?Objective:  ?  ?BP 134/87   Pulse 80   Temp 98.3 ?F (36.8 ?C) (Oral)   Ht '5\' 6"'$  (1.676 m)   Wt 278 lb 12.8 oz (126.5 kg)   SpO2 97%   BMI 45.00 kg/m?   ?Wt Readings from Last 3 Encounters:  ?11/17/21 278 lb 12.8 oz (126.5 kg)  ?10/01/21 274 lb 3.2 oz (124.4 kg)  ?09/25/21 274 lb 9.6 oz (124.6 kg)  ?  ?Physical Exam ?Vitals and nursing note reviewed.  ?Constitutional:   ?   General: She is awake. She is not in acute distress. ?   Appearance: She is well-developed and well-groomed. She is morbidly obese. She is not ill-appearing.  ?HENT:  ?   Head: Normocephalic.  ?   Right Ear: Hearing normal.  ?   Left Ear: Hearing normal.  ?Eyes:  ?   General: Lids are normal.     ?   Right eye: No discharge.     ?   Left eye: No discharge.  ?   Conjunctiva/sclera: Conjunctivae normal.  ?   Pupils: Pupils are equal, round, and reactive to light.  ?Neck:  ?   Thyroid: No thyromegaly.  ?   Vascular: No carotid bruit.  ?Cardiovascular:  ?   Rate and Rhythm: Normal rate and regular rhythm.  ?   Heart sounds: Normal heart sounds. No murmur heard. ?  No gallop.  ?Pulmonary:  ?   Effort:  Pulmonary effort is normal. No accessory muscle usage or respiratory distress.  ?   Breath sounds: Normal breath sounds.  ?Abdominal:  ?   General: Bowel sounds are normal. There is no distension.  ?   Palpations: Abdomen i

## 2021-11-17 NOTE — Assessment & Plan Note (Addendum)
Chronic, ongoing with BP at goal today in office.  Continue current medication regimen and adjust as needed.  Focus on DASH diet and regular exercise at home.  Recommend she check BP at few days a week at home and document for provider.  Losartan for kidney protection, urine ALB 10 on 08/18/21.  Return in 3 months. ?

## 2021-11-17 NOTE — Assessment & Plan Note (Addendum)
Chronic, stable.  Denies SI/HI.  Followed by psychiatry, will continue this collaboration and current medication regimen as prescribed by them.  Lamictal level today.  Return in 3 months. ?

## 2021-11-17 NOTE — Assessment & Plan Note (Signed)
BMI 45.00 with T2DM, HTN/HLD.  Recommended eating smaller high protein, low fat meals more frequently and exercising 30 mins a day 5 times a week with a goal of 10-15lb weight loss in the next 3 months. Patient voiced their understanding and motivation to adhere to these recommendations. ? ? ?

## 2021-11-17 NOTE — Assessment & Plan Note (Signed)
Noted on imaging 03/17/2018.  Recommend continued use of statin daily and consider addition of ASA in future for prevention. ?

## 2021-11-17 NOTE — Assessment & Plan Note (Signed)
Present x 2 weeks.  Continue Protonix as ordered and recommend maintaining food journal to assess if any triggers.  Will send in Epi pen as precaution, in case of allergic issues being present, although low suspicion for this.  Will get into GI for further assessment, ?stricture presenting. ?

## 2021-11-17 NOTE — Assessment & Plan Note (Signed)
Chronic, ongoing.  Continue Omeprazole 40 MG daily and continue to collaborate with GI.  Mag level up to date. ?

## 2021-11-17 NOTE — Assessment & Plan Note (Signed)
Ongoing issue, has had multiple imaging done with no acute findings.  Continue Protonix and will get back into GI for further assessment.  Labs today. ?

## 2021-11-17 NOTE — Assessment & Plan Note (Signed)
Chronic, ongoing with A1c 6.2% today, downward trend, and urine ALB 10 (February 2023).  Continue Losartan for kidney protection.  Will reduce Metformin to 1000 MG in morning and 500 MG in evening + continue Trulicity 3 MG weekly -- in future could consider increase dosing 4.5 MG and further reduction Metformin.  Educated on BS goals in morning and 2 hours after meals.  Return in 3 months.   ?

## 2021-11-17 NOTE — Assessment & Plan Note (Signed)
Chronic, ongoing.  Continue on statin which is offering benefit.  Lipid panel today.  Return in 3 months. 

## 2021-11-17 NOTE — Assessment & Plan Note (Signed)
Followed by neurology, continue collaboration with neurology team and recent notes reviewed. 

## 2021-11-17 NOTE — Assessment & Plan Note (Signed)
Chronic, ongoing.  Continue Levothyroxine 150 MCG daily.  Educated her on this.  Check thyroid levels today. ?

## 2021-11-18 ENCOUNTER — Encounter: Payer: Self-pay | Admitting: Nurse Practitioner

## 2021-11-18 NOTE — Progress Notes (Signed)
Contacted via [place] ? ? ?Good evening Lori Knight, your labs have returned: ?- Kidney function, creatinine and eGFR, remains normal.  Great news!! ?- Cholesterol labs are more elevated this check, were you fasting?  Are you taking Atorvastatin daily?  Let me know. ?- Lamictal lab is still pending. ?- Thyroid labs show normal TSH and Free T4, continue Levothyroxine dosing.  One thing on labs, your thyroid antibody is elevated, which points your thyroid disease to be more related to Hashimoto's Thyroiditis.  This is an autoimmune cause of thyroid disease where your body does not recognize your thyroid and basically attacks it, causing inflammation.  We treat this same way as we are now, but it helps [place] understand more.  Some patients will also focus on diet changes, a Hashimoto's focused diet to decrease inflammation.  There is information on this online, for some this has worked well along with their medication.  Any questions? ?Keep being amazing!!  Thank you for allowing me to participate in your care.  I appreciate you. ?Kindest regards, ?Valera V[place] ?

## 2021-11-19 LAB — BASIC METABOLIC PANEL
BUN/Creatinine Ratio: 12 (ref 9–23)
BUN: 10 mg/dL (ref 6–24)
CO2: 23 mmol/L (ref 20–29)
Calcium: 9.3 mg/dL (ref 8.7–10.2)
Chloride: 103 mmol/L (ref 96–106)
Creatinine, Ser: 0.81 mg/dL (ref 0.57–1.00)
Glucose: 100 mg/dL — ABNORMAL HIGH (ref 70–99)
Potassium: 4.3 mmol/L (ref 3.5–5.2)
Sodium: 140 mmol/L (ref 134–144)
eGFR: 87 mL/min/{1.73_m2} (ref >59)

## 2021-11-19 LAB — T4, FREE: Free T4: 1.16 ng/dL (ref 0.82–1.77)

## 2021-11-19 LAB — LIPID PANEL W/O CHOL/HDL RATIO
Cholesterol, Total: 203 mg/dL — ABNORMAL HIGH (ref 100–199)
HDL: 35 mg/dL — ABNORMAL LOW (ref >39)
LDL Chol Calc (NIH): 111 mg/dL — ABNORMAL HIGH (ref 0–99)
Triglycerides: 327 mg/dL — ABNORMAL HIGH (ref 0–149)
VLDL Cholesterol Cal: 57 mg/dL — ABNORMAL HIGH (ref 5–40)

## 2021-11-19 LAB — THYROID PEROXIDASE ANTIBODY: Thyroperoxidase Ab SerPl-aCnc: 151 IU/mL — ABNORMAL HIGH (ref 0–34)

## 2021-11-19 LAB — LAMOTRIGINE LEVEL: Lamotrigine Lvl: 4.6 ug/mL (ref 2.0–20.0)

## 2021-11-19 LAB — TSH: TSH: 2.62 u[IU]/mL (ref 0.450–4.500)

## 2021-11-19 NOTE — Progress Notes (Signed)
Lamictal level normal:)

## 2021-11-26 ENCOUNTER — Ambulatory Visit (INDEPENDENT_AMBULATORY_CARE_PROVIDER_SITE_OTHER): Payer: Commercial Managed Care - HMO | Admitting: Psychology

## 2021-11-26 DIAGNOSIS — F309 Manic episode, unspecified: Secondary | ICD-10-CM | POA: Diagnosis not present

## 2021-11-26 NOTE — Progress Notes (Signed)
[facility] Counselor/Therapist Progress Note  Patient ID: Miaa Latterell, MRN: [id number],    Date: 11/26/2021  Time Spent: 8:02 am - 8:28 am:  26 minutes  Treatment Type: Individual Therapy.  Reported Symptoms: Anxiety   Mental Status Exam: Appearance:  Neat and Well Groomed      Behavior: Appropriate  Motor: Normal  Speech/Language:  Clear and Coherent and Normal Rate  Affect: Congruent  Mood: dysthymic  Thought process: normal  Thought content:   WNL  Sensory/Perceptual disturbances:   WNL  Orientation: oriented to person, place, time/date, and situation  Attention: Good  Concentration: Good  Memory: [place] of knowledge:  Good  Insight:   Good  Judgment:  Good  Impulse Control: Good   Subjective:   [name] participated home, via video, and consented to treatment. Therapist participated from home office.We met online due to [place] pandemic. [name] reviewed the events of the past two weeks. Alejandro noted work related pressures and her attempts to manage this. She noted a recent reduction in her Lamictal from 300 to 247m. She noted the adjustment going well, overall. 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 She discussed difficulty identifying positives in her day-to-day life. We were able to identify positives, during the session, and discussed the benefit of working on identifying positives day-to-day. PAudreyannaexpressed her commitment towards this goal. PLaurenashleywas engaged and motivated during the session. Therapist validated Dorie's feelings and provided supportive therapy.   Interventions: Cognitive Behavioral Therapy  Diagnosis:Manic disorder, single episode ([place]   Plan: Patient is to use CBT, ACT, BA, mindfulness, and coping skills to help manage decrease symptoms associated with their diagnosis. (Target Date 01/05/22)   Long-term goal:    Reduce overall level, frequency, and intensity of the feelings of depression and anxiety evidenced by negative self-talk, rumination, and poor self-esteem  from 6 to 7 days/week to 0 to 1 days/week per client report for at least 3 consecutive months.  Short-term goal:  Verbally express understanding of the relationship between feelings of depression, anxiety and their impact on thinking patterns and behaviors.  Verbalize an understanding of the role that distorted thinking plays in creating fears, excessive worry, and ruminations.  (Nevin Bloodgoodparticipated in the creation of the treatment plan)  OBuena Irish LCSW

## 2021-11-27 ENCOUNTER — Encounter: Payer: Self-pay | Admitting: Nurse Practitioner

## 2021-11-28 ENCOUNTER — Encounter: Payer: Self-pay | Admitting: Nurse Practitioner

## 2021-12-02 ENCOUNTER — Encounter: Payer: Self-pay | Admitting: Nurse Practitioner

## 2021-12-02 ENCOUNTER — Ambulatory Visit (INDEPENDENT_AMBULATORY_CARE_PROVIDER_SITE_OTHER): Payer: Commercial Managed Care - HMO | Admitting: Nurse Practitioner

## 2021-12-02 VITALS — BP 132/85 | HR 83 | Temp 97.9°F | Ht 66.0 in | Wt 275.6 lb

## 2021-12-02 DIAGNOSIS — G8929 Other chronic pain: Secondary | ICD-10-CM

## 2021-12-02 DIAGNOSIS — K644 Residual hemorrhoidal skin tags: Secondary | ICD-10-CM

## 2021-12-02 DIAGNOSIS — E063 Autoimmune thyroiditis: Secondary | ICD-10-CM | POA: Diagnosis not present

## 2021-12-02 DIAGNOSIS — R1031 Right lower quadrant pain: Secondary | ICD-10-CM | POA: Diagnosis not present

## 2021-12-02 DIAGNOSIS — B356 Tinea cruris: Secondary | ICD-10-CM

## 2021-12-02 MED ORDER — HYDROCORTISONE ACETATE 25 MG RE SUPP: 25.0000 mg | Freq: Two times a day (BID) | RECTAL | 0 refills | Status: DC

## 2021-12-02 MED ORDER — NYSTATIN 100000 UNIT/GM EX POWD: 1.0000 "application " | Freq: Three times a day (TID) | CUTANEOUS | 4 refills | Status: AC

## 2021-12-02 NOTE — Patient Instructions (Signed)
Abdominal Pain, Adult Many things can cause belly (abdominal) pain. Most times, belly pain is not dangerous. Many cases of belly pain can be watched and treated at home. Sometimes, though, belly pain is serious. Your doctor will try to find the cause of your belly pain. Follow these instructions at home:  Medicines Take over-the-counter and prescription medicines only as told by your doctor. Do not take medicines that help you poop (laxatives) unless told by your doctor. General instructions Watch your belly pain for any changes. Drink enough fluid to keep your pee (urine) pale yellow. Keep all follow-up visits as told by your doctor. This is important. Contact a doctor if: Your belly pain changes or gets worse. You are not hungry, or you lose weight without trying. You are having trouble pooping (constipated) or have watery poop (diarrhea) for more than 2-3 days. You have pain when you pee or poop. Your belly pain wakes you up at night. Your pain gets worse with meals, after eating, or with certain foods. You are vomiting and cannot keep anything down. You have a fever. You have blood in your pee. Get help right away if: Your pain does not go away as soon as your doctor says it should. You cannot stop vomiting. Your pain is only in areas of your belly, such as the right side or the left lower part of the belly. You have bloody or black poop, or poop that looks like tar. You have very bad pain, cramping, or bloating in your belly. You have signs of not having enough fluid or water in your body (dehydration), such as: Dark pee, very little pee, or no pee. Cracked lips. Dry mouth. Sunken eyes. Sleepiness. Weakness. You have trouble breathing or chest pain. Summary Many cases of belly pain can be watched and treated at home. Watch your belly pain for any changes. Take over-the-counter and prescription medicines only as told by your doctor. Contact a doctor if your belly pain  changes or gets worse. Get help right away if you have very bad pain, cramping, or bloating in your belly. This information is not intended to replace advice given to you by your health care provider. Make sure you discuss any questions you have with your health care provider. Document Revised: 11/06/2018 Document Reviewed: 11/06/2018 Elsevier Patient Education  2023 Elsevier Inc.  

## 2021-12-02 NOTE — Progress Notes (Signed)
BP 132/85   Pulse 83   Temp 97.9 F (36.6 C) (Oral)   Ht '5\' 6"'  (1.676 m)   Wt 275 lb 9.6 oz (125 kg)   SpO2 96%   BMI 44.48 kg/m    Subjective:    Patient ID: Lori Knight, female    DOB: 1970/10/30, 51 y.o.   MRN: [id number]  HPI: Lori Knight is a 51 y.o. female  Chief Complaint  Patient presents with   Stool Color Change   Abdominal Pain   Hemorrhoids    Patient states about a week ago, she had issues with her hemorrhoids. Patient states she noticed a clear oozing liquid was coming from her bottom. Patient states when she wiped she has made it worse and she noticed some discomfort between the vagina and anus area. Patient states she is scheduled with the Gastroenterologist, but that appointment is in August.   ABDOMINAL PAIN  Has ongoing issues with this, to right upper quadrant down to right lower quadrant -- this morning it started when she had her cup of tea.  On Sunday she had a bowel movement that was black in color.  Her stool varies from solid chunks to soft.  She reports the solid stool on Sunday was black in color and the softer was beige.  Does have hemorrhoids, about 4 days before these bowel movements the stool was clear liquid leaking out of bottom.  Was putting compresses on bottom.    Eating and drinking make pain worse.  Last CT in August 2022 was reassuring with normal appendix and stable bilateral renal angiomyolipomas + U/S stable in October 2022 with recommendation to perform CT with contrast if ongoing issues.  Is scheduled to see GI in August for this.   Duration:months Onset: gradual Severity: 4/10 Quality: dull and aching Location:  RUQ, RLQ, and diffuseEpisode duration:  Radiation: no Frequency: intermittent Alleviating factors: unknown Aggravating factors: eating and drinking -- no other things she can pinpoint Status: fluctuating Treatments attempted: PPI and changing diet, including lactose reduction Fever: no Nausea: no Vomiting:  no Weight loss: no Decreased appetite: no Diarrhea:  occasional Constipation:  occasional Blood in stool:  on Sunday Heartburn: no Jaundice: no Rash: yes Dysuria/urinary frequency: no Hematuria: no History of sexually transmitted disease: no Recurrent NSAID use: no   HEMORRHOIDS Irritated at this time, is using Tucks and compresses. Duration: days Anal fullness: no Perianal itching/irritation: yes Perianal pain:  burning sensation Bright red rectal bleeding: no Constipation: no Hard stools: no Chronic straining/valsava: no Alleviating factors: nothing Aggravating factors: unknown Status: fluctuating Treatments attempted: OTC medicated wipes  Previous hemorrhoids: yes  Colonoscopy: yes   Relevant past medical, surgical, family and social history reviewed and updated as indicated. Interim medical history since our last visit reviewed. Allergies and medications reviewed and updated.  Review of Systems  Constitutional:  Negative for activity change, appetite change, diaphoresis, fatigue and fever.  Respiratory:  Negative for cough, chest tightness and shortness of breath.   Cardiovascular:  Negative for chest pain, palpitations and leg swelling.  Gastrointestinal:  Positive for abdominal distention, abdominal pain and blood in stool (recent dark stool x one episode on Sunday). Negative for constipation, diarrhea, nausea and vomiting.  Neurological: Negative.   Psychiatric/Behavioral: Negative.     Per HPI unless specifically indicated above     Objective:    BP 132/85   Pulse 83   Temp 97.9 F (36.6 C) (Oral)   Ht '5\' 6"'  (1.676 m)  Wt 275 lb 9.6 oz (125 kg)   SpO2 96%   BMI 44.48 kg/m   Wt Readings from Last 3 Encounters:  12/02/21 275 lb 9.6 oz (125 kg)  11/17/21 278 lb 12.8 oz (126.5 kg)  10/01/21 274 lb 3.2 oz (124.4 kg)    Physical Exam Vitals and nursing note reviewed. Exam conducted with a chaperone present.  Constitutional:      General: She is  awake. She is not in acute distress.    Appearance: She is well-developed and well-groomed. She is morbidly obese. She is not ill-appearing.  HENT:     Head: Normocephalic.     Right Ear: Hearing normal.     Left Ear: Hearing normal.  Eyes:     General: Lids are normal.        Right eye: No discharge.        Left eye: No discharge.     Conjunctiva/sclera: Conjunctivae normal.     Pupils: Pupils are equal, round, and reactive to light.  Neck:     Thyroid: No thyromegaly.     Vascular: No carotid bruit.  Cardiovascular:     Rate and Rhythm: Normal rate and regular rhythm.     Heart sounds: Normal heart sounds. No murmur heard.   No gallop.  Pulmonary:     Effort: Pulmonary effort is normal. No accessory muscle usage or respiratory distress.     Breath sounds: Normal breath sounds.  Abdominal:     General: Bowel sounds are normal. There is no distension.     Palpations: Abdomen is soft. There is no hepatomegaly.     Tenderness: There is abdominal tenderness in the right upper quadrant and right lower quadrant. There is no right CVA tenderness, left CVA tenderness, guarding or rebound. Negative signs include Murphy's sign, McBurney's sign, psoas sign and obturator sign.     Hernia: No hernia is present.     Comments: Tender to RLQ with grimacing -- slight tenderness with Murphy testing RUQ.  Genitourinary:    Rectum: External hemorrhoid present.     Comments: X 1 small, non inflamed external hemorrhoid present.  Significant shiny flat erythema present between gluteus. Musculoskeletal:     Cervical back: Normal range of motion and neck supple.     Right lower leg: No edema.     Left lower leg: No edema.  Lymphadenopathy:     Cervical: No cervical adenopathy.  Skin:    General: Skin is warm and dry.  Neurological:     Mental Status: She is alert and oriented to person, place, and time.  Psychiatric:        Attention and Perception: Attention normal.        Mood and Affect: Mood  normal.        Speech: Speech normal.        Behavior: Behavior normal. Behavior is cooperative.        Thought Content: Thought content normal.    Results for orders placed or performed in visit on 11/17/21  Bayer DCA Hb A1c Waived  Result Value Ref Range   HB A1C (BAYER DCA - WAIVED) 6.2 (H) 4.8 - 5.6 %  Basic metabolic panel  Result Value Ref Range   Glucose 100 (H) 70 - 99 mg/dL   BUN 10 6 - 24 mg/dL   Creatinine, Ser 0.81 0.57 - 1.00 mg/dL   eGFR 87 >59 mL/min/1.73   BUN/Creatinine Ratio 12 9 - 23   Sodium 140 134 -  144 mmol/L   Potassium 4.3 3.5 - 5.2 mmol/L   Chloride 103 96 - 106 mmol/L   CO2 23 20 - 29 mmol/L   Calcium 9.3 8.7 - 10.2 mg/dL  Lipid Panel w/o Chol/HDL Ratio  Result Value Ref Range   Cholesterol, Total 203 (H) 100 - 199 mg/dL   Triglycerides 327 (H) 0 - 149 mg/dL   HDL 35 (L) >39 mg/dL   VLDL Cholesterol Cal 57 (H) 5 - 40 mg/dL   LDL Chol Calc (NIH) 111 (H) 0 - 99 mg/dL  Lamotrigine level  Result Value Ref Range   Lamotrigine Lvl 4.6 2.0 - 20.0 ug/mL  TSH  Result Value Ref Range   TSH 2.620 0.450 - 4.500 uIU/mL  T4, free  Result Value Ref Range   Free T4 1.16 0.82 - 1.77 ng/dL  Thyroid peroxidase antibody  Result Value Ref Range   Thyroperoxidase Ab SerPl-aCnc 151 (H) 0 - 34 IU/mL      Assessment & Plan:   Problem List Items Addressed This Visit       Cardiovascular and Mediastinum   Hemorrhoids, external    Non thrombosed, but patient symptomatic with this.  Send in [place] for treatment and monitor.  If worsening consider general surgery referral.         Endocrine   Hashimoto's thyroiditis    Chronic, ongoing.  Continue Levothyroxine 150 MCG daily.  Educated her on this.  Check thyroid levels today.         Other   Chronic RLQ pain - Primary    Ongoing issue with x one recent episode of darker stool.  She is exquisitely tender to RLQ on exam.  Previous CT and ultrasound were reassuring, with recommendation for CT with contrast if  ongoing discomfort.  Previous labs reassuring.  Will recheck labs today TSH, Free T4, CBC, CMP, Alpha Gal, Celiac, H Pylori and stool for blood.  Order for CT with contrast placed to further assess. She is scheduled with GI in August and she will call to see if she can move this up.  At this time no blood in stool per her report.  Continue all current medication and determine next steps after return imaging and labs.       Relevant Orders   CBC with Differential/Platelet   Comprehensive metabolic panel   TSH   T4, free   Alpha-Gal Panel   Celiac Disease Panel   Fecal occult blood, imunochemical   H. pylori antigen, stool   CT Abdomen Pelvis W Contrast   Other Visit Diagnoses     Tinea cruris       Noted to gluteal folds.  Nystatin powder sent in.   Relevant Medications   nystatin (MYCOSTATIN/NYSTOP) powder        Follow up plan: Return in about 3 weeks (around 12/23/2021) for Abdominal pain.

## 2021-12-02 NOTE — Assessment & Plan Note (Signed)
Ongoing issue with x one recent episode of darker stool.  She is exquisitely tender to RLQ on exam.  Previous CT and ultrasound were reassuring, with recommendation for CT with contrast if ongoing discomfort.  Previous labs reassuring.  Will recheck labs today TSH, Free T4, CBC, CMP, Alpha Gal, Celiac, H Pylori and stool for blood.  Order for CT with contrast placed to further assess. She is scheduled with GI in August and she will call to see if she can move this up.  At this time no blood in stool per her report.  Continue all current medication and determine next steps after return imaging and labs.

## 2021-12-02 NOTE — Assessment & Plan Note (Signed)
Non thrombosed, but patient symptomatic with this.  Send in [place] for treatment and monitor.  If worsening consider general surgery referral.

## 2021-12-02 NOTE — Assessment & Plan Note (Signed)
Chronic, ongoing.  Continue Levothyroxine 150 MCG daily.  Educated her on this.  Check thyroid levels today.

## 2021-12-03 NOTE — Progress Notes (Signed)
Contacted via [place] morning Marni, waiting on some more labs to come in, but currently labs that are returned overall are stable.  I did do Celiac and Alpha Gal testing on labs as well and these are still pending.  Any questions? Keep being stellar!!  Thank you for allowing me to participate in your care.  I appreciate you. Kindest regards, Janelle Culton

## 2021-12-06 LAB — CBC WITH DIFFERENTIAL/PLATELET
Basophils Absolute: 0.1 10*3/uL (ref 0.0–0.2)
Basos: 1 %
EOS (ABSOLUTE): 0.1 10*3/uL (ref 0.0–0.4)
Eos: 2 %
Hematocrit: 43.7 % (ref 34.0–46.6)
Hemoglobin: 14.2 g/dL (ref 11.1–15.9)
Immature Grans (Abs): 0 10*3/uL (ref 0.0–0.1)
Immature Granulocytes: 1 %
Lymphocytes Absolute: 2.4 10*3/uL (ref 0.7–3.1)
Lymphs: 29 %
MCH: 28.3 pg (ref 26.6–33.0)
MCHC: 32.5 g/dL (ref 31.5–35.7)
MCV: 87 fL (ref 79–97)
Monocytes Absolute: 0.5 10*3/uL (ref 0.1–0.9)
Monocytes: 6 %
Neutrophils Absolute: 5.1 10*3/uL (ref 1.4–7.0)
Neutrophils: 61 %
Platelets: 328 10*3/uL (ref 150–450)
RBC: 5.02 x10E6/uL (ref 3.77–5.28)
RDW: 14.6 % (ref 11.7–15.4)
WBC: 8.2 10*3/uL (ref 3.4–10.8)

## 2021-12-06 LAB — COMPREHENSIVE METABOLIC PANEL
ALT: 27 IU/L (ref 0–32)
AST: 15 IU/L (ref 0–40)
Albumin/Globulin Ratio: 1.5 (ref 1.2–2.2)
Albumin: 4.2 g/dL (ref 3.8–4.9)
Alkaline Phosphatase: 125 IU/L — ABNORMAL HIGH (ref 44–121)
BUN/Creatinine Ratio: 11 (ref 9–23)
BUN: 9 mg/dL (ref 6–24)
Bilirubin Total: 0.2 mg/dL (ref 0.0–1.2)
CO2: 26 mmol/L (ref 20–29)
Calcium: 9.4 mg/dL (ref 8.7–10.2)
Chloride: 103 mmol/L (ref 96–106)
Creatinine, Ser: 0.85 mg/dL (ref 0.57–1.00)
Globulin, Total: 2.8 g/dL (ref 1.5–4.5)
Glucose: 129 mg/dL — ABNORMAL HIGH (ref 70–99)
Potassium: 4.2 mmol/L (ref 3.5–5.2)
Sodium: 142 mmol/L (ref 134–144)
Total Protein: 7 g/dL (ref 6.0–8.5)
eGFR: 82 mL/min/{1.73_m2} (ref >59)

## 2021-12-06 LAB — ALPHA-GAL PANEL
Allergen Lamb IgE: <0.1 kU/L
Beef IgE: <0.1 kU/L
IgE (Immunoglobulin E), Serum: 9 IU/mL (ref 6–495)
O215-IgE Alpha-Gal: <0.1 kU/L
Pork IgE: <0.1 kU/L

## 2021-12-06 LAB — CELIAC DISEASE PANEL
Endomysial IgA: NEGATIVE
IgA/Immunoglobulin A, Serum: 295 mg/dL (ref 87–352)
Transglutaminase IgA: <2 U/mL (ref 0–3)

## 2021-12-06 LAB — T4, FREE: Free T4: 1.33 ng/dL (ref 0.82–1.77)

## 2021-12-06 LAB — TSH: TSH: 2.71 u[IU]/mL (ref 0.450–4.500)

## 2021-12-06 NOTE — Progress Notes (Signed)
Alpha Gal and Celiac testing is negative:)

## 2021-12-09 ENCOUNTER — Encounter: Payer: Self-pay | Admitting: Gastroenterology

## 2021-12-12 LAB — FECAL OCCULT BLOOD, IMMUNOCHEMICAL: Fecal Occult Bld: NEGATIVE

## 2021-12-12 LAB — H. PYLORI ANTIGEN, STOOL: H pylori Ag, Stl: NEGATIVE

## 2021-12-12 NOTE — Progress Notes (Signed)
Contacted via MyChart   No blood in stool.  Great news!!  Waiting on H Pylori to result.

## 2021-12-13 NOTE — Progress Notes (Signed)
Hi Docie.  Your H. Pylori test was negative.  Please let us know if you have any questions.

## 2021-12-15 ENCOUNTER — Ambulatory Visit
Admission: RE | Admit: 2021-12-15 | Discharge: 2021-12-15 | Disposition: A | Discharge status: Home / Self Care | Payer: Commercial Managed Care - HMO | Source: Ambulatory Visit | Attending: Nurse Practitioner | Admitting: Nurse Practitioner

## 2021-12-15 ENCOUNTER — Encounter: Payer: Self-pay | Admitting: Nurse Practitioner

## 2021-12-15 DIAGNOSIS — G8929 Other chronic pain: Secondary | ICD-10-CM

## 2021-12-15 DIAGNOSIS — Z1231 Encounter for screening mammogram for malignant neoplasm of breast: Secondary | ICD-10-CM

## 2021-12-15 MED ORDER — IOPAMIDOL (ISOVUE-300) INJECTION 61%
100.0000 mL | Freq: Once | INTRAVENOUS | Status: AC | PRN
  Administered 2021-12-15: 100 mL via INTRAVENOUS

## 2021-12-16 ENCOUNTER — Ambulatory Visit (INDEPENDENT_AMBULATORY_CARE_PROVIDER_SITE_OTHER): Payer: Commercial Managed Care - HMO | Admitting: Psychology

## 2021-12-16 DIAGNOSIS — F309 Manic episode, unspecified: Secondary | ICD-10-CM | POA: Diagnosis not present
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
## 2021-12-17 NOTE — Progress Notes (Signed)
Contacted via [name] morning Lori Knight, your imaging has returned and continues to show no findings to explain the discomfort you have -- no infection or suspicious masses found.  Definitely keep visit with Dr. Allen Knight on 12/21/21 for further assessment and recommendations.  Have a great day!! Keep being amazing!!  Thank you for allowing me to participate in your care.  I appreciate you. Kindest regards, Lori Knight

## 2021-12-21 ENCOUNTER — Encounter: Payer: Self-pay | Admitting: Gastroenterology

## 2021-12-21 ENCOUNTER — Ambulatory Visit (INDEPENDENT_AMBULATORY_CARE_PROVIDER_SITE_OTHER): Payer: Commercial Managed Care - HMO | Admitting: Gastroenterology

## 2021-12-21 VITALS — BP 143/94 | HR 73 | Ht 66.0 in | Wt 280.6 lb

## 2021-12-21 DIAGNOSIS — M7918 Myalgia, other site: Secondary | ICD-10-CM | POA: Diagnosis not present

## 2021-12-21 DIAGNOSIS — G8929 Other chronic pain: Secondary | ICD-10-CM

## 2021-12-21 NOTE — Progress Notes (Signed)
Primary Care Physician: [name], NP  Primary Gastroenterologist:  Dr. [name]  Chief Complaint  Patient presents with   Abdominal Pain    HPI: Lori Knight here with a report of stomach pain for the last year.  The patient called for an earlier appointment and was put in today.  The patient had a CT scan ordered by her primary care provider this month that showed:  IMPRESSION: 1. Scattered colonic diverticulosis.  2. Colonic diverticulosis with no acute diverticulitis. 3. Chronic stable right renal angiomyolipoma. 4.  Aortic Atherosclerosis (ICD10-I70.0).  The patient had a negative alpha gal, celiac sprue panel, Hemoccult stools and H. Pylori.  The patient had an upper endoscopy in July of last year for heartburn that showed gastritis. The patient now reports that her abdominal pain is not associated with any food in particular and not always associated with food.  It has been associated with movements and started when she states that she felt a ripping sensation while moving and states that it was so painful she had to go to the floor.  The symptoms have been going on about 7 months.  There is no report of any unexplained weight loss fevers chills nausea vomiting black stools or bloody stools.  She did have 1 episode of dark stools and that is why her stools were checked for blood.  Past Medical History:  Diagnosis Date   Adult hypothyroidism 06/21/2012   Anxiety    Back pain    Benign paroxysmal positional nystagmus    Bilateral polycystic ovarian syndrome 06/21/2012   Biliary calculus with cholecystitis 08/14/2012   Bipolar 1 disorder (HCC)    BP (high blood pressure) 02/14/2014   Bronchitis    Constipation    COVID-19 07/2020   Depression    Diabetes mellitus ([place]) 08/14/2012   Diabetes mellitus without complication ([place])    pre-diabetic   Dysrhythmia    wore heart monitor 2016. Corrected by changing Levothyroxine dose.   Edema of  both lower extremities    Gallbladder problem    GERD (gastroesophageal reflux disease)    Heart murmur    followed by PCP-AS A CHILD-ASYMPTOMATIC   Hyperlipidemia    Hyperprolactinemia ([place]) 06/21/2012   Hypertension    Hypothyroid    Hypoxia 04/17/2018   Joint pain    Kidney problem    Meningioma (HCC)    Motion sickness    carnival rides   Neuropathy    PONV (postoperative nausea and vomiting)    Low BP after sinus surgery. WAKES UP CRYING   Shortness of breath dyspnea    stairs. related to wt.   Sleep apnea    has CPAP. has not used since 2011   Swallowing difficulty    Vertigo 2016   none recently   Vitamin D deficiency     Current Outpatient Medications  Medication Sig Dispense Refill   acetaminophen (TYLENOL) 500 MG tablet Take 500 mg by mouth every 6 (six) hours as needed.     amLODipine (NORVASC) 5 MG tablet Take 1 tablet (5 mg total) by mouth daily. 90 tablet 4   Ascorbic Acid (VITAMIN C) 1000 MG tablet Take 1,000 mg by mouth daily.     atorvastatin (LIPITOR) 20 MG tablet Take 1 tablet (20 mg total) by mouth daily. 90 tablet 4   Cholecalciferol 5000 units capsule Take 1 capsule (5,000 Units total) by mouth daily.     CONTOUR NEXT TEST test  strip USE TO CHECK BS UP TO 3 TIMES DAILY FOR DIABETES DX: E11.9 100 strip 12   Dulaglutide (TRULICITY) 3 TD/1.7OH SOPN Inject 3 mg as directed once a week. 6 mL 4   EPINEPHrine 0.3 mg/0.3 mL IJ SOAJ injection Inject 0.3 mg into the muscle as needed for anaphylaxis. 1 each 4   hydrocortisone (ANUSOL-HC) 25 MG suppository Place 1 suppository (25 mg total) rectally 2 (two) times daily. 12 suppository 0   ipratropium (ATROVENT) 0.06 % nasal spray Place 2 sprays into both nostrils 4 (four) times daily. 15 mL 12   lamoTRIgine (LAMICTAL) 100 MG tablet Take 100 mg by mouth daily.     lamoTRIgine (LAMICTAL) 150 MG tablet Take 150 mg by mouth daily.     levothyroxine (SYNTHROID) 150 MCG tablet Take 1 tablet (150 mcg total) by mouth daily.  90 tablet 4   losartan (COZAAR) 50 MG tablet Take 2 tablets (100 mg total) by mouth daily. 180 tablet 4   metFORMIN (GLUCOPHAGE) 500 MG tablet Take 2 tablets (1,000 mg total) by mouth 2 (two) times daily. 360 tablet 4   mometasone (ELOCON) 0.1 % cream SMARTSIG:1 Application Topical     nystatin (MYCOSTATIN/NYSTOP) powder Apply 1 application. topically 3 (three) times daily. 15 g 4   Omega-3 Fatty Acids (FISH OIL) 1200 MG CAPS Take 1,200 mg by mouth daily.      omeprazole (PRILOSEC) 40 MG capsule TAKE 1 CAPSULE BY MOUTH EVERY DAY 90 capsule 4   No current facility-administered medications for this visit.    Allergies as of 12/21/2021 - Review Complete 12/21/2021  Allergen Reaction Noted   Hctz [hydrochlorothiazide] Cough 12/26/2017   Lisinopril Cough 03/01/2015   Latex Rash 06/17/2015   Prednisone Anxiety 05/05/2018    ROS:  General: Negative for anorexia, weight loss, fever, chills, fatigue, weakness. ENT: Negative for hoarseness, difficulty swallowing , nasal congestion. CV: Negative for chest pain, angina, palpitations, dyspnea on exertion, peripheral edema.  Respiratory: Negative for dyspnea at rest, dyspnea on exertion, cough, sputum, wheezing.  GI: See history of present illness. GU:  Negative for dysuria, hematuria, urinary incontinence, urinary frequency, nocturnal urination.  Endo: Negative for unusual weight change.    Physical Examination:   BP (!) 143/94 (BP Location: Left Arm, Patient Position: Sitting, Cuff Size: Large)   Pulse 73   Ht '5\' 6"'$  (1.676 m)   Wt 280 lb 9.6 oz (127.3 kg)   BMI 45.29 kg/m   General: Well-nourished, well-developed in no acute distress.  Eyes: No icterus. Conjunctivae pink. Lungs: Clear to auscultation bilaterally. Non-labored. Heart: Regular rate and rhythm, no murmurs rubs or gallops.  Abdomen: Bowel sounds are normal, positive tenderness to 1 finger palpation while flexing the abdominal wall muscle, nondistended, no hepatosplenomegaly  or masses, no abdominal bruits or hernia , no rebound or guarding.   Extremities: No lower extremity edema. No clubbing or deformities. Neuro: Alert and oriented x 3.  Grossly intact. Skin: Warm and dry, no jaundice.   Psych: Alert and cooperative, normal mood and affect.  Labs:    Imaging Studies: CT Abdomen Pelvis W Contrast  Result Date: 12/16/2021 CLINICAL DATA:  RLQ abdominal pain (Age >= 14y) Ongoing pain to RLQ and RUQ for several months with past imaging reassuring, however was recommendations for CT with contrast if ongoing pain -- she recently had one episode of dark black stool EXAM: CT ABDOMEN AND PELVIS WITH CONTRAST TECHNIQUE: Multidetector CT imaging of the abdomen and pelvis was performed using the standard protocol  following bolus administration of intravenous contrast. RADIATION DOSE REDUCTION: This exam was performed according to the departmental dose-optimization program which includes automated exposure control, adjustment of the mA and/or kV according to patient size and/or use of iterative reconstruction technique. CONTRAST:  163m ISOVUE-300 IOPAMIDOL (ISOVUE-300) INJECTION 61% COMPARISON:  CT abdomen pelvis 02/18/2021, CT abdomen pelvis 03/17/2018 FINDINGS: Lower chest: No acute abnormality. Hepatobiliary: No focal liver abnormality. Status post cholecystectomy. No biliary dilatation. Pancreas: No focal lesion. Normal pancreatic contour. No surrounding inflammatory changes. No main pancreatic ductal dilatation. Spleen: Normal in size without focal abnormality. Adrenals/Urinary Tract: No adrenal nodule bilaterally. Bilateral kidneys enhance symmetrically. Chronic stable 3.9 x 3 cm fat density lesion within the right kidney likely representing a an angiomyolipoma. Subcentimeter hypodensity within left kidney too small to characterize. No hydronephrosis. No hydroureter. The urinary bladder is unremarkable. Stomach/Bowel: Layering hyperdense material within the gastric lumen consistent  with ingested medication. Stomach is within normal limits. No evidence of bowel wall thickening or dilatation. Scattered colonic diverticulosis. Appendix appears normal. Vascular/Lymphatic: No abdominal aorta or iliac aneurysm. Mild atherosclerotic plaque of the aorta and its branches. No abdominal, pelvic, or inguinal lymphadenopathy. Reproductive: Status post hysterectomy. No adnexal masses. Other: No intraperitoneal free fluid. No intraperitoneal free gas. No organized fluid collection. Musculoskeletal: Tiny fat containing umbilical hernia. No suspicious lytic or blastic osseous lesions. No acute displaced fracture. IMPRESSION: 1. Scattered colonic diverticulosis. 2. Colonic diverticulosis with no acute diverticulitis. 3. Chronic stable right renal angiomyolipoma. 4.  Aortic Atherosclerosis (ICD10-I70.0). Electronically Signed   By: MIven FinnM.D.   On: 12/16/2021 18:40    Assessment and Plan:   Lori Knight who comes in today with a history of abdominal pain for 7 to 8 months.  The patient also suffers from back pain.  The patient has reproducible pain with 1 finger palpation while flexing the abdominal wall muscles.  The patient has been explained that the symptoms are consistent with musculoskeletal pain and not intestinal pain.  The patient has been given the results of her CT scan and I have reviewed the blood work with her.  The patient has been told to use warm compresses on the abdominal wall pain and massage them as necessary.  She has been told to avoid any further damage to the abdominal wall muscles.  The patient has been told that the front pain she is having is also more common in people who have back pain like she is having.  The patient has been explained the plan agrees with it.     DLucilla Lame MD. FMarval Regal   Note: This dictation was prepared with Dragon dictation along with smaller phrase technology. Any transcriptional errors that result from this  process are unintentional.

## 2021-12-29 ENCOUNTER — Ambulatory Visit: Payer: Commercial Managed Care - HMO | Admitting: Nurse Practitioner

## 2022-01-04 ENCOUNTER — Ambulatory Visit (INDEPENDENT_AMBULATORY_CARE_PROVIDER_SITE_OTHER): Payer: Commercial Managed Care - HMO | Admitting: Psychology

## 2022-01-04 DIAGNOSIS — F309 Manic episode, unspecified: Secondary | ICD-10-CM | POA: Diagnosis not present

## 2022-01-04 NOTE — Progress Notes (Signed)
[facility] Counselor/Therapist Progress Note  Patient ID: Lori Knight, MRN: [id number],    Date: 01/04/2022  Time Spent: 8:04 am - 8:52 am:  48 minutes  Treatment Type: Individual Therapy.  Reported Symptoms: Anxiety   Mental Status Exam: Appearance:  Neat and Well Groomed     Behavior: Appropriate  Motor: Normal  Speech/Language:  Clear and Coherent and Normal Rate  Affect: Congruent  Mood: normal  Thought process: normal  Thought content:   WNL  Sensory/Perceptual disturbances:   WNL  Orientation: oriented to person, place, time/date, and situation  Attention: Good  Concentration: Good  Memory: WNL  Fund of knowledge:  Good  Insight:   Good  Judgment:  Good  Impulse Control: Good   Subjective:  Lori Knight participated home, via video, and consented to treatment. Therapist participated from home office.We met online due to COVID pandemic. Lori Knight reviewed the events of the past two weeks. She noted feeling "guilty" that her daughter has moved out. She noted this transition and the positives related to including a quieter and more neat and organized home. She noted her worry regarding her daughter's dog, [name], who is dealing with health issues that might be cancer. She noted her attempts to postpone her worry, until Wednesday, until she receives feedback from International aid/development worker gives feedback. She noted frustration regarding a current volunteering opportunity and noted her decision-making to leave. We explored this during the session and her attempts to set boundaries going forward. We worked on identifying additional areas where [name] would like to identify areas of control and lack of control and to delineate decision-making, going forward. Therapist praised [name] for her approach and her attempts to manage her stressors more proactively. Therapist provided supportive therapy.   Interventions: Cognitive Behavioral Therapy and  Interpersonal  Diagnosis:Bipolar I disorder, single manic episode (HCC)   Plan: Patient is to use CBT, ACT, BA, mindfulness, and coping skills to help manage decrease symptoms associated with their diagnosis. (Target Date 01/05/22)   Long-term goal:   Reduce overall level, frequency, and intensity of the feelings of depression and anxiety evidenced by negative self-talk, rumination, and poor self-esteem  from 6 to 7 days/week to 0 to 1 days/week per client report for at least 3 consecutive months.  Short-term goal:  Verbally express understanding of the relationship between feelings of depression, anxiety and their impact on thinking patterns and behaviors.  Verbalize an understanding of the role that distorted thinking plays in creating fears, excessive worry, and ruminations.  [name] participated in the creation of the treatment plan)  Delight Ovens, LCSW

## 2022-01-15 ENCOUNTER — Encounter: Payer: Self-pay | Admitting: Nurse Practitioner

## 2022-01-15 ENCOUNTER — Ambulatory Visit (INDEPENDENT_AMBULATORY_CARE_PROVIDER_SITE_OTHER): Payer: Commercial Managed Care - HMO | Admitting: Nurse Practitioner

## 2022-01-15 VITALS — BP 134/89 | HR 83 | Temp 98.2°F | Ht 66.0 in | Wt 278.0 lb

## 2022-01-15 DIAGNOSIS — M545 Low back pain, unspecified: Secondary | ICD-10-CM

## 2022-01-15 DIAGNOSIS — G8929 Other chronic pain: Secondary | ICD-10-CM | POA: Diagnosis not present

## 2022-01-15 DIAGNOSIS — G3184 Mild cognitive impairment, so stated: Secondary | ICD-10-CM | POA: Insufficient documentation

## 2022-01-15 DIAGNOSIS — R413 Other amnesia: Secondary | ICD-10-CM

## 2022-01-15 NOTE — Assessment & Plan Note (Signed)
Ongoing, will placed referral to PT for further assessment and recommendations.

## 2022-01-15 NOTE — Assessment & Plan Note (Signed)
Ongoing for 3 years since Covid.  ?long haul Covid changes vs menopausal hormone shifts vs other chronic issue.  At this time 6 CIT = 2.  Will obtain labs to include B12, RPR, CBC, CMP.  Referral to neurology placed.  MRI stable last year.

## 2022-01-15 NOTE — Progress Notes (Signed)
BP 134/89   Pulse 83   Temp 98.2 F (36.8 C) (Oral)   Ht 5' 6" (1.676 m)   Wt 278 lb (126.1 kg)   SpO2 94%   BMI 44.87 kg/m    Subjective:    Patient ID: Lori Knight, female    DOB: Mar 27, 1971, 51 y.o.   MRN: [id number]  HPI: Lori Knight is a 51 y.o. female  Chief Complaint  Patient presents with   Memory Loss    Patient says she had previously discussed her memory and she says has not gotten any better.    Medication Management   She would like a referral to [facility] for back pain ongoing.  Notices discomfort more if not wearing bra, although bra irritates past area of surgery.  MEMORY ISSUES: Has been struggling with memory issues for 3 years -- this presented after Covid, she believes they had it in November 2019.  She is followed by neurosurgery (Dr. Lacinda Axon) for meningioma, last saw 10/14/20.  She reports she has been eating bad the past couple months, has noticed memory worse and body aches all over.  She has had psychiatry lower medication doses, but this is not helping.  Last brain MRI 09/08/20, which showed stable meningioma.  Her recent A1c was 6.2% in May.  Last time B12 level checked  580 in 2017.    Does struggle at times with word finding, will have conversation and feel like she can not find the words.  No issues with long term memory.  Feels like can not think as quickly.  No loss of direction or change in routine.       01/15/2022    4:02 PM  6CIT Screen  What Year? 0 points  What month? 0 points  What time? 0 points  Count back from 20 0 points  Months in reverse 0 points  Repeat phrase 2 points  Total Score 2 points    Relevant past medical, surgical, family and social history reviewed and updated as indicated. Interim medical history since our last visit reviewed. Allergies and medications reviewed and updated.  Review of Systems  Constitutional:  Negative for activity change, appetite change, diaphoresis, fatigue and fever.  Respiratory:   Negative for cough, chest tightness and shortness of breath.   Cardiovascular:  Negative for chest pain, palpitations and leg swelling.  Gastrointestinal: Negative.   Endocrine: Negative for cold intolerance, heat intolerance, polydipsia, polyphagia and polyuria.  Neurological: Negative.   Psychiatric/Behavioral: Negative.      Per HPI unless specifically indicated above     Objective:    BP 134/89   Pulse 83   Temp 98.2 F (36.8 C) (Oral)   Ht 5' 6" (1.676 m)   Wt 278 lb (126.1 kg)   SpO2 94%   BMI 44.87 kg/m   Wt Readings from Last 3 Encounters:  01/15/22 278 lb (126.1 kg)  12/21/21 280 lb 9.6 oz (127.3 kg)  12/02/21 275 lb 9.6 oz (125 kg)    Physical Exam Vitals and nursing note reviewed.  Constitutional:      General: She is awake. She is not in acute distress.    Appearance: She is well-developed and well-groomed. She is morbidly obese. She is not ill-appearing.  HENT:     Head: Normocephalic.     Right Ear: Hearing normal.     Left Ear: Hearing normal.  Eyes:     General: Lids are normal.        Right eye:  No discharge.        Left eye: No discharge.     Conjunctiva/sclera: Conjunctivae normal.     Pupils: Pupils are equal, round, and reactive to light.  Neck:     Thyroid: No thyromegaly.     Vascular: No carotid bruit.  Cardiovascular:     Rate and Rhythm: Normal rate and regular rhythm.     Heart sounds: Normal heart sounds. No murmur heard.    No gallop.  Pulmonary:     Effort: Pulmonary effort is normal. No accessory muscle usage or respiratory distress.     Breath sounds: Normal breath sounds.  Abdominal:     General: Bowel sounds are normal.     Palpations: Abdomen is soft.  Musculoskeletal:     Cervical back: Normal range of motion and neck supple.     Right lower leg: No edema.     Left lower leg: No edema.  Lymphadenopathy:     Cervical: No cervical adenopathy.  Skin:    General: Skin is warm and dry.  Neurological:     Mental Status:  She is alert and oriented to person, place, and time.  Psychiatric:        Attention and Perception: Attention normal.        Mood and Affect: Mood normal.        Speech: Speech normal.        Behavior: Behavior normal. Behavior is cooperative.        Thought Content: Thought content normal.    Results for orders placed or performed in visit on 12/02/21  Fecal occult blood, imunochemical   Specimen: Stool   ST     CD- [id number] V  Result Value Ref Range   Fecal Occult Bld Negative Negative  CBC with Differential/Platelet  Result Value Ref Range   WBC 8.2 3.4 - 10.8 x10E3/uL   RBC 5.02 3.77 - 5.28 x10E6/uL   Hemoglobin 14.2 11.1 - 15.9 g/dL   Hematocrit 05.2 81.8 - 46.6 %   MCV 87 79 - 97 fL   MCH 28.3 26.6 - 33.0 pg   MCHC 32.5 31.5 - 35.7 g/dL   RDW 85.7 71.1 - 07.8 %   Platelets 328 150 - 450 x10E3/uL   Neutrophils 61 Not Estab. %   Lymphs 29 Not Estab. %   Monocytes 6 Not Estab. %   Eos 2 Not Estab. %   Basos 1 Not Estab. %   Neutrophils Absolute 5.1 1.4 - 7.0 x10E3/uL   Lymphocytes Absolute 2.4 0.7 - 3.1 x10E3/uL   Monocytes Absolute 0.5 0.1 - 0.9 x10E3/uL   EOS (ABSOLUTE) 0.1 0.0 - 0.4 x10E3/uL   Basophils Absolute 0.1 0.0 - 0.2 x10E3/uL   Immature Granulocytes 1 Not Estab. %   Immature Grans (Abs) 0.0 0.0 - 0.1 x10E3/uL  Comprehensive metabolic panel  Result Value Ref Range   Glucose 129 (H) 70 - 99 mg/dL   BUN 9 6 - 24 mg/dL   Creatinine, Ser 9.17 0.57 - 1.00 mg/dL   eGFR 82 >63 NN/RCB/3.41   BUN/Creatinine Ratio 11 9 - 23   Sodium 142 134 - 144 mmol/L   Potassium 4.2 3.5 - 5.2 mmol/L   Chloride 103 96 - 106 mmol/L   CO2 26 20 - 29 mmol/L   Calcium 9.4 8.7 - 10.2 mg/dL   Total Protein 7.0 6.0 - 8.5 g/dL   Albumin 4.2 3.8 - 4.9 g/dL   Globulin, Total 2.8 1.5 - 4.5 g/dL  Albumin/Globulin Ratio 1.5 1.2 - 2.2   Bilirubin Total 0.2 0.0 - 1.2 mg/dL   Alkaline Phosphatase 125 (H) 44 - 121 IU/L   AST 15 0 - 40 IU/L   ALT 27 0 - 32 IU/L  TSH  Result Value Ref  Range   TSH 2.710 0.450 - 4.500 uIU/mL  T4, free  Result Value Ref Range   Free T4 1.33 0.82 - 1.77 ng/dL  Alpha-Gal Panel  Result Value Ref Range   Class Description Allergens Comment    IgE (Immunoglobulin E), Serum 9 6 - 495 IU/mL   O215-IgE Alpha-Gal <0.10 Class 0 kU/L   Beef IgE <0.10 Class 0 kU/L   Pork IgE <0.10 Class 0 kU/L   Allergen Lamb IgE <0.10 Class 0 kU/L  Celiac Disease Panel  Result Value Ref Range   Endomysial IgA Negative Negative   Transglutaminase IgA <2 0 - 3 U/mL   IgA/Immunoglobulin A, Serum 295 87 - 352 mg/dL  H. pylori antigen, stool  Result Value Ref Range   H pylori Ag, Stl Negative Negative      Assessment & Plan:   Problem List Items Addressed This Visit       Other   Chronic midline low back pain without sciatica    Ongoing, will placed referral to PT for further assessment and recommendations.      Relevant Orders   Ambulatory referral to Physical Therapy   Memory changes - Primary    Ongoing for 3 years since Covid.  ?long haul Covid changes vs menopausal hormone shifts vs other chronic issue.  At this time 6 CIT = 2.  Will obtain labs to include B12, RPR, CBC, CMP.  Referral to neurology placed.  MRI stable last year.      Relevant Orders   RPR   Vitamin B12   CBC with Differential/Platelet   Comprehensive metabolic panel   Ambulatory referral to Neurology     Follow up plan: Return in about 5 weeks (around 02/22/2022) for T2DM, HTN/HLD, MOOD, OSA.

## 2022-01-15 NOTE — Patient Instructions (Signed)

## 2022-01-16 LAB — COMPREHENSIVE METABOLIC PANEL
ALT: 29 IU/L (ref 0–32)
AST: 18 IU/L (ref 0–40)
Albumin/Globulin Ratio: 1.7 (ref 1.2–2.2)
Albumin: 4.7 g/dL (ref 3.8–4.9)
Alkaline Phosphatase: 115 IU/L (ref 44–121)
BUN/Creatinine Ratio: 19 (ref 9–23)
BUN: 17 mg/dL (ref 6–24)
Bilirubin Total: 0.4 mg/dL (ref 0.0–1.2)
CO2: 25 mmol/L (ref 20–29)
Calcium: 9.9 mg/dL (ref 8.7–10.2)
Chloride: 101 mmol/L (ref 96–106)
Creatinine, Ser: 0.89 mg/dL (ref 0.57–1.00)
Globulin, Total: 2.7 g/dL (ref 1.5–4.5)
Glucose: 112 mg/dL — ABNORMAL HIGH (ref 70–99)
Potassium: 4.4 mmol/L (ref 3.5–5.2)
Sodium: 145 mmol/L — ABNORMAL HIGH (ref 134–144)
Total Protein: 7.4 g/dL (ref 6.0–8.5)
eGFR: 78 mL/min/{1.73_m2} (ref >59)

## 2022-01-16 LAB — CBC WITH DIFFERENTIAL/PLATELET
Basophils Absolute: 0.1 10*3/uL (ref 0.0–0.2)
Basos: 1 %
EOS (ABSOLUTE): 0.2 10*3/uL (ref 0.0–0.4)
Eos: 1 %
Hematocrit: 43.8 % (ref 34.0–46.6)
Hemoglobin: 14.6 g/dL (ref 11.1–15.9)
Immature Grans (Abs): 0.1 10*3/uL (ref 0.0–0.1)
Immature Granulocytes: 1 %
Lymphocytes Absolute: 3.3 10*3/uL — ABNORMAL HIGH (ref 0.7–3.1)
Lymphs: 31 %
MCH: 28.7 pg (ref 26.6–33.0)
MCHC: 33.3 g/dL (ref 31.5–35.7)
MCV: 86 fL (ref 79–97)
Monocytes Absolute: 0.7 10*3/uL (ref 0.1–0.9)
Monocytes: 7 %
Neutrophils Absolute: 6.4 10*3/uL (ref 1.4–7.0)
Neutrophils: 59 %
Platelets: 310 10*3/uL (ref 150–450)
RBC: 5.09 x10E6/uL (ref 3.77–5.28)
RDW: 13.9 % (ref 11.7–15.4)
WBC: 10.6 10*3/uL (ref 3.4–10.8)

## 2022-01-16 LAB — RPR: RPR Ser Ql: NONREACTIVE

## 2022-01-16 LAB — VITAMIN B12: Vitamin B-12: 522 pg/mL (ref 232–1245)

## 2022-01-16 NOTE — Progress Notes (Signed)
Contacted via [place] afternoon Lori Knight, your labs have returned: - CBC overall stable with very mild elevation in lymphocytes only -- this is not concerning for memory. - Kidney function, creatinine and eGFR, remains normal, as is liver function, AST and ALT.  Sodium, salt, level mildly elevated -- cut back on salt intake at home and increase water intake. - Syphilis testing is negative and B12 level normal.  Definitely schedule with neurology when they call for further assessment.  Any questions? Keep being amazing!!  Thank you for allowing me to participate in your care.  I appreciate you. Kindest regards, Anahid Eskelson

## 2022-01-18 ENCOUNTER — Encounter: Payer: Self-pay | Admitting: Nurse Practitioner

## 2022-01-25 ENCOUNTER — Ambulatory Visit (INDEPENDENT_AMBULATORY_CARE_PROVIDER_SITE_OTHER): Payer: Commercial Managed Care - HMO | Admitting: Psychology

## 2022-01-25 DIAGNOSIS — F309 Manic episode, unspecified: Secondary | ICD-10-CM | POA: Diagnosis not present

## 2022-01-25 NOTE — Progress Notes (Signed)
[place] Counselor/Therapist Progress Note  Patient ID: Lori Knight, MRN: [id number],    Date: 01/25/2022  Time Spent: 8:06 am - 8:50 am: 44 minutes  Treatment Type: Individual Therapy.  Reported Symptoms: Anxiety   Mental Status Exam: Appearance:  Neat and Well Groomed     Behavior: Appropriate  Motor: Normal  Speech/Language:  Clear and Coherent and Normal Rate  Affect: Congruent  Mood: normal  Thought process: normal  Thought content:   WNL  Sensory/Perceptual disturbances:   WNL  Orientation: oriented to person, place, time/date, and situation  Attention: Good  Concentration: Good  Memory: [place] of knowledge:  Good  Insight:   Good  Judgment:  Good  Impulse Control: Good   Subjective:  Lori Knight participated home, via video, and consented to treatment. Therapist participated from home office.We met online due to [place] pandemic. Lori Knight reviewed the events of the past two weeks. Lori Knight noted some difficulty with her medication regimen and noted taking more than prescribed. She noted making adjustments to ensure that she takes her medication as prescribed. Therapist praised Lori Knight for her mindfulness and awareness in regards to her mood. She noted some work related changes and noted this being lucrative for her. She noted being "an [place] in the sand" and working on not borrowing worries. We explored this during the session and worked on ways to manage her frustrations and anxiety. Lori Knight noted often "leaving" when heard or not agreed with. She noted her family's frustration regarding her approach. We explored this. Therapist provided psycho-education regarding bipolar and the effects of interpersonal stressors on mood and mood fluctuations. Therapist encouraged Lori Knight to identify ways to set boundaries with others going forward. Therapist provided supportive therapy.   Interventions: Cognitive Behavioral Therapy and  Interpersonal  Diagnosis:Bipolar I disorder, single manic episode ([place])   Plan: Patient is to use CBT, ACT, BA, mindfulness, and coping skills to help manage decrease symptoms associated with their diagnosis. (Target Date 01/05/22)   Long-term goal:   Reduce overall level, frequency, and intensity of the feelings of depression and anxiety evidenced by negative self-talk, rumination, and poor self-esteem  from 6 to 7 days/week to 0 to 1 days/week per client report for at least 3 consecutive months.  Short-term goal:  Verbally express understanding of the relationship between feelings of depression, anxiety and their impact on thinking patterns and behaviors.  Verbalize an understanding of the role that distorted thinking plays in creating fears, excessive worry, and ruminations.  Lori Knight participated in the creation of the treatment plan)  Lori Irish, LCSW

## 2022-02-09 ENCOUNTER — Ambulatory Visit (INDEPENDENT_AMBULATORY_CARE_PROVIDER_SITE_OTHER): Payer: Commercial Managed Care - HMO | Admitting: Psychology

## 2022-02-09 DIAGNOSIS — F309 Manic episode, unspecified: Secondary | ICD-10-CM

## 2022-02-09 NOTE — Progress Notes (Signed)
[facility] Counselor/Therapist Progress Note  Patient ID: Lori Knight, MRN: [id number],    Date: 02/09/2022  Time Spent: 8:00 am - 8:48 am:48 minutes  Treatment Type: Individual Therapy.  Reported Symptoms: Anxiety   Mental Status Exam: Appearance:  Neat and Well Groomed     Behavior: Appropriate  Motor: Normal  Speech/Language:  Clear and Coherent and Normal Rate  Affect: Congruent  Mood: normal  Thought process: normal  Thought content:   WNL  Sensory/Perceptual disturbances:   WNL  Orientation: oriented to person, place, time/date, and situation  Attention: Good  Concentration: Good  Memory: [place] of knowledge:  Good  Insight:   Good  Judgment:  Good  Impulse Control: Good   Subjective:  [name] participated home, via video, and consented to treatment. Therapist participated from home office.We met online due to [place] pandemic. [name] reviewed the events of the past two weeks. Ayn noted being "pissed off" at her friend, Caryl Pina. We explored this during the session and Waver's frustration regarding her friend's recent behavior. We worked on identifying the effect of these stressors on Jermika's mood. We discussed setting boundaries, going forward, to maintain mood and reduce stress. Therapist praised Niley for her effort to be mindful of her mood and needs, communicate assertively, and set boundaries. We discussed applying these skills in other areas going forward. Therapist provided supportive therapy.   Interventions: Cognitive Behavioral Therapy and Interpersonal  Diagnosis:Bipolar I disorder, single manic episode ([place])   Plan: Patient is to use CBT, ACT, BA, mindfulness, and coping skills to help manage decrease symptoms associated with their diagnosis. (Target Date 02/10/22)   Long-term goal:   Reduce overall level, frequency, and intensity of the feelings of depression and anxiety evidenced by negative self-talk, rumination, and  poor self-esteem  from 6 to 7 days/week to 0 to 1 days/week per client report for at least 3 consecutive months.  Short-term goal:  Verbally express understanding of the relationship between feelings of depression, anxiety and their impact on thinking patterns and behaviors.  Verbalize an understanding of the role that distorted thinking plays in creating fears, excessive worry, and ruminations.  [name] participated in the creation of the treatment plan)  [name], LCSW

## 2022-02-12 ENCOUNTER — Encounter: Payer: Self-pay | Admitting: Nurse Practitioner

## 2022-02-12 DIAGNOSIS — Z1283 Encounter for screening for malignant neoplasm of skin: Secondary | ICD-10-CM

## 2022-02-16 ENCOUNTER — Encounter: Payer: Self-pay | Admitting: Speech Pathology

## 2022-02-16 ENCOUNTER — Ambulatory Visit: Payer: Commercial Managed Care - HMO | Attending: Neurology | Admitting: Speech Pathology

## 2022-02-16 DIAGNOSIS — R41841 Cognitive communication deficit: Secondary | ICD-10-CM | POA: Diagnosis present

## 2022-02-16 NOTE — Therapy (Signed)
OUTPATIENT SPEECH LANGUAGE PATHOLOGY EVALUATION   Patient Name: Lori Knight MRN: [id number] DOB:[birth year], 51 y.o., female Today's Date: 02/17/2022  PCP: Lori Guarneri, NP REFERRING PROVIDER: Jennings Books, MD   End of Session - 02/16/22 1423     Visit Number 1    Number of Visits 5    Date for SLP Re-Evaluation 04/18/22   60 days to allow for scheduling   SLP Start Time 1405    SLP Stop Time  1505    SLP Time Calculation (min) 60 min    Activity Tolerance Patient tolerated treatment well             Past Medical History:  Diagnosis Date   Adult hypothyroidism 06/21/2012   Anxiety    Back pain    Benign paroxysmal positional nystagmus    Bilateral polycystic ovarian syndrome 06/21/2012   Biliary calculus with cholecystitis 08/14/2012   Bipolar 1 disorder (HCC)    BP (high blood pressure) 02/14/2014   Bronchitis    Constipation    COVID-19 07/2020   Depression    Diabetes mellitus ([place]) 08/14/2012   Diabetes mellitus without complication ([place])    pre-diabetic   Dysrhythmia    wore heart monitor 2016. Corrected by changing Levothyroxine dose.   Edema of both lower extremities    Gallbladder problem    GERD (gastroesophageal reflux disease)    Heart murmur    followed by PCP-AS A CHILD-ASYMPTOMATIC   Hyperlipidemia    Hyperprolactinemia ([place]) 06/21/2012   Hypertension    Hypothyroid    Hypoxia 04/17/2018   Joint pain    Kidney problem    Meningioma (HCC)    Motion sickness    carnival rides   Neuropathy    PONV (postoperative nausea and vomiting)    Low BP after sinus surgery. WAKES UP CRYING   Shortness of breath dyspnea    stairs. related to wt.   Sleep apnea    has CPAP. has not used since 2011   Swallowing difficulty    Vertigo 2016   none recently   Vitamin D deficiency    Past Surgical History:  Procedure Laterality Date   CHOLECYSTECTOMY     COLONOSCOPY WITH PROPOFOL N/A 06/19/2015   Procedure: COLONOSCOPY WITH PROPOFOL;  Surgeon:  Lori Lame, MD;  Location: [facility];  Service: Endoscopy;  Laterality: N/A;  Diabetic - oral meds    DENTAL SURGERY     ESOPHAGOGASTRODUODENOSCOPY (EGD) WITH PROPOFOL N/A 01/16/2021   Procedure: ESOPHAGOGASTRODUODENOSCOPY (EGD) WITH PROPOFOL;  Surgeon: Lori Lame, MD;  Location: [facility];  Service: Endoscopy;  Laterality: N/A;  Diabetic - oral meds   ETHMOIDECTOMY Bilateral 04/17/2018   Procedure: ETHMOIDECTOMY;  Surgeon: Lori Sheffield, MD;  Location: [facility];  Service: ENT;  Laterality: Bilateral;   GALLBLADDER SURGERY     IMAGE GUIDED SINUS SURGERY N/A 04/17/2018   Procedure: IMAGE GUIDED SINUS SURGERY;  Surgeon: Lori Sheffield, MD;  Location: [facility];  Service: ENT;  Laterality: N/A;   MAXILLARY ANTROSTOMY Bilateral 04/17/2018   Procedure: MAXILLARY ANTROSTOMY;  Surgeon: Lori Sheffield, MD;  Location: [facility];  Service: ENT;  Laterality: Bilateral;   NASAL SEPTOPLASTY W/ TURBINOPLASTY Bilateral 04/17/2018   Procedure: NASAL SEPTOPLASTY WITH TURBINATE REDUCTION;  Surgeon: Lori Sheffield, MD;  Location: [facility];  Service: ENT;  Laterality: Bilateral;   TOTAL ABDOMINAL HYSTERECTOMY  2012   cervical dysplasia/ovaries remian   TRANSURETHRAL RESECTION OF BLADDER TUMOR N/A 04/17/2018   Procedure: NTRANSURETHRAL RESECTION OF BLADDER TUMOR (TURBT) WITH  GEMCITABINE;  Surgeon: Lori Espy, MD;  Location: [facility];  Service: Urology;  Laterality: N/A;   Patient Active Problem List   Diagnosis Date Noted   Memory changes 01/15/2022   Chronic midline low back pain without sciatica 01/15/2022   Hemorrhoids, external 12/02/2021   Dysphagia 11/17/2021   Aortic atherosclerosis ([place]) 09/30/2020   Type 2 diabetes mellitus with proteinuria ([place]) 02/20/2020   Meningioma ([place]) 12/24/2019   Hyperlipidemia associated with type 2 diabetes mellitus ([place]) 08/13/2019   Gastroesophageal reflux disease without esophagitis 06/20/2019   Type 2 diabetes mellitus with morbid obesity ([place])  12/01/2015   OSA on CPAP 09/22/2015   Insomnia 08/28/2015   Internal hemorrhoids 08/28/2015   Vitamin D deficiency 08/28/2015   Obesity, Class III, BMI 40-49.9 (morbid obesity) ([place]) 08/27/2015   Hypertension associated with diabetes ([place]) 02/14/2014   Benign paroxysmal positional nystagmus 02/14/2014   Benign neoplasm of kidney 08/14/2012   Bipolar I disorder, single manic episode ([place]) 08/14/2012   Hashimoto's thyroiditis 06/21/2012   Bilateral polycystic ovarian syndrome 06/21/2012    ONSET DATE: referral 02/10/22; pt reports gradual onset beginning in 2019   REFERRING DIAG: cognitive disorder  THERAPY DIAG:  Cognitive communication deficit  Rationale for Evaluation and Treatment Rehabilitation  SUBJECTIVE:   SUBJECTIVE STATEMENT: "I don't speak as intelligently." Pt accompanied by: self  PERTINENT HISTORY: Lori Knight is a 51 y.o. female referred by Dr. Manuella Knight for cognitive impairments. Past medical history includes bipolar disorder, anxiety, depression, and right frontal meningioma dx 2020 which is being managed by surveillance imaging; per neurosurgery "risk of radiation and resection at this time outweigh any benefit given the small size and asymptomatic nature." Pt also self-reports concern for ADHD but has not been formally evaluated for this.  DIAGNOSTIC FINDINGS: MRI 09/08/21: A meningioma overlying the lateral right frontal lobe has slightly increased in size as compared to the MRI of 01/09/2019, now measuring 14 x 10 x 13 mm. As before, there is mild local mass effect upon the underlying right frontal lobe without underlying parenchymal edema.  PAIN:  Are you having pain? No   FALLS: Has patient fallen in last 6 months?  No  LIVING ENVIRONMENT: Lives with: lives with their spouse Lives in: House/apartment  PLOF:  Level of assistance: Independent with ADLs Employment: Full-time employment   PATIENT GOALS improve communication and cognitive  function  OBJECTIVE:   COGNITION: Overall cognitive status:  difficult to assess in context of comorbidities including bipolar disorder Areas of impairment:  Patient testing within normal limits for cognitive domains, however she reports functional deficits Functional deficits: can't remember details or appointments without writing them down, misplacing items.   COGNITIVE COMMUNICATION Following directions: Follows multi-step commands consistently  Auditory comprehension: WFL Verbal expression: WFL Functional communication: no anomia noted; patient does   ORAL MOTOR EXAMINATION Facial : WFL Lingual: WFL Velum: WFL Mandible: WFL Cough: WFL Voice: WFL   STANDARDIZED ASSESSMENTS:   Cognitive Linguistic Quick Test: AGE - 18 - 69   The Cognitive Linguistic Quick Test (CLQT) was administered to assess the relative status of five cognitive domains: attention, memory, language, executive functioning, and visuospatial skills. Scores from 10 tasks were used to estimate severity ratings (standardized for age groups 18-69 years and 70-89 years) for each domain, a clock drawing task, as well as an overall composite severity rating of cognition.       Task Score Criterion Cut Scores  Personal Facts 8/8 8  Symbol Cancellation 11/12 11  Confrontation Naming 10/10 10  Clock Drawing  13/13 12  Story Retelling 6/10 6  Symbol Trails 10/10 9  Generative Naming 6/9 5  Design Memory 6/6 5  Mazes  7/8 7  Design Generation 7/13 6    Cognitive Domain Composite Score Severity Rating  Attention 188/215 WNL  Memory 158/185 WNL  Executive Function 30/40 WNL  Language 30/37 WNL  Visuospatial Skills 94/105 WNL  Clock Drawing  13/13 WNL  Composite Severity Rating  WNL     [place] Naming Test  The 15-item [place] Naming Test was administered. Pt scored 15/15. This is WNL for age.  Number of spontaneously given correct responses: 14 Number of stimulus cues given: 1 Number of correct  responses following a stimulus cue: 1 Number of phonemic cues: 0 Number of correct responses following a phonemic cue: 0 Number of multiple choices given: 0 Number of correct choices: 0 Total correct responses: 15  Paraphasias Phonological: 0 Verbal: 0 Neologistic: 0 Multi-word: 0 Perceptual:0        PATIENT REPORTED OUTCOME MEASURES (PROM):  The Neuro-QOLT Item Bank v2.0-Cognition Function-Short Form is an eight-item test designed to measure difficulties with cognitive functioning (e.g., memory, attention and decision making or in the application of such abilities to everyday tasks (e.g., planning, organizing, calculating, remembering and learning).  T-SCORE: 27.3; 2.27 SD below mean of 50    PATIENT EDUCATION: Education details: Brief course of ST would be focused on use of strategies and identifying solutions to address functional deficits as research does not support generalization of cognitive drills to everyday tasks Person educated: Patient Education method: Explanation Education comprehension: verbalized understanding     GOALS: Goals reviewed with patient? Yes   LONG TERM GOALS: Target date: 04/18/22 Pt will verbalize and implement 4 attention and memory strategies to aid daily functioning for ADLs and IADLs  Baseline:  Goal status: INITIAL  2.  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 Patient reports feeling anxious when she hesitates and is unable to come up with a word in conversations with clients, and has difficulty remembering details if she does not write things down.  A brief course of ST (4 additional visits) is recommended with focus on compensation strategy training to improve her communication and cognitive functioning in work and social settings. Patient reports concerns for ADHD for which she may benefit from neuropsychology assessment for formal testing.  OBJECTIVE IMPAIRMENTS include  functional deficits in wordfinding and attention . These impairments are limiting patient from effectively communicating at home and in community. Factors affecting potential to achieve goals and functional outcome are co-morbidities.. Patient will benefit from skilled SLP services to address above impairments and improve overall function.  REHAB POTENTIAL: Fair -good  PLAN: SLP FREQUENCY: 1x/week  SLP DURATION: 4 weeks  PLANNED INTERVENTIONS: Language facilitation, Environmental controls, Internal/external aids, Functional tasks, Multimodal communication approach, SLP instruction and feedback, Compensatory strategies, and Patient/family education   Deneise Lever, MS, Actor [phone]

## 2022-02-17 ENCOUNTER — Encounter (INDEPENDENT_AMBULATORY_CARE_PROVIDER_SITE_OTHER): Payer: Self-pay

## 2022-02-17 LAB — HM DIABETES EYE EXAM

## 2022-02-18 ENCOUNTER — Encounter: Payer: Commercial Managed Care - HMO | Admitting: Speech Pathology

## 2022-02-19 ENCOUNTER — Ambulatory Visit: Payer: Commercial Managed Care - HMO | Admitting: Nurse Practitioner

## 2022-02-23 ENCOUNTER — Ambulatory Visit: Payer: Commercial Managed Care - HMO | Admitting: Nurse Practitioner

## 2022-02-23 DIAGNOSIS — I700 Atherosclerosis of aorta: Secondary | ICD-10-CM

## 2022-02-23 DIAGNOSIS — E1159 Type 2 diabetes mellitus with other circulatory complications: Secondary | ICD-10-CM

## 2022-02-23 DIAGNOSIS — E063 Autoimmune thyroiditis: Secondary | ICD-10-CM

## 2022-02-23 DIAGNOSIS — E1129 Type 2 diabetes mellitus with other diabetic kidney complication: Secondary | ICD-10-CM

## 2022-02-23 DIAGNOSIS — F309 Manic episode, unspecified: Secondary | ICD-10-CM

## 2022-02-23 DIAGNOSIS — G4733 Obstructive sleep apnea (adult) (pediatric): Secondary | ICD-10-CM

## 2022-02-23 DIAGNOSIS — R413 Other amnesia: Secondary | ICD-10-CM

## 2022-02-23 DIAGNOSIS — E1169 Type 2 diabetes mellitus with other specified complication: Secondary | ICD-10-CM

## 2022-02-25 ENCOUNTER — Encounter: Payer: Commercial Managed Care - HMO | Admitting: Speech Pathology

## 2022-03-02 ENCOUNTER — Ambulatory Visit (INDEPENDENT_AMBULATORY_CARE_PROVIDER_SITE_OTHER): Payer: Commercial Managed Care - HMO | Admitting: Psychology

## 2022-03-02 ENCOUNTER — Ambulatory Visit: Payer: Managed Care, Other (non HMO) | Admitting: Gastroenterology

## 2022-03-02 DIAGNOSIS — F309 Manic episode, unspecified: Secondary | ICD-10-CM | POA: Diagnosis not present

## 2022-03-02 NOTE — Progress Notes (Unsigned)
Comprehensive Clinical Assessment (CCA) Note  03/02/2022 Linsi Humann [id number]  Time Spent: 9:02  am - 9:55 am: 53 Minutes  Chief Complaint: No chief complaint on file.  Visit Diagnosis: Bipolar Dx (by history and record)   Guardian/Payee:  self    Paperwork requested: No   Reason for Visit /Presenting Problem: F30.9  Mental Status Exam: Appearance:   Casual     Behavior:  Appropriate  Motor:  Normal  Speech/Language:   Clear and Coherent  Affect:  Congruent  Mood:  normal  Thought process:  normal  Thought content:    WNL  Sensory/Perceptual disturbances:    WNL  Orientation:  oriented to person, place, time/date, and situation  Attention:  Good  Concentration:  Good  Memory:  WNL  Fund of knowledge:   Good  Insight:    Good  Judgment:   Good  Impulse Control:  Good   Reported Symptoms:  Bipolar  Risk Assessment: Danger to Self:  No Self-injurious Behavior: No Danger to Others: No Duty to Warn:no Physical Aggression / Violence:No  Access to Firearms a concern: No  Gang Involvement:No  Patient / guardian was educated about steps to take if suicide or homicide risk level increases between visits: n/a While future psychiatric events cannot be accurately predicted, the patient does not currently require acute inpatient psychiatric care and does not currently meet [facility] involuntary commitment criteria.  Substance Abuse History: Current substance abuse: No     Caffeine: 1 tea (daily) + 3-4 cokes a week.  Tobacco: denied. Alcohol: infrequent per month.  Substance use: denied.   Past Psychiatric History:   Previous psychological history is significant for Bipolar. Outpatient Providers:Daniel Willeen Cass, M.D. ([place]) and [name], LCSW [facility]). Deneise Lever, [place], [place] speech therapy. Hemang K. Manuella Ghazi, MD (neurologist).  History of Psych Hospitalization: yes, at 66, no hospitalization but had pumped stomach.  Psychological Testing:   Hemang K. Manuella Ghazi, MD    Abuse History:  Victim of: No.,  na    Report needed: No. Victim of Neglect:No. Perpetrator of  NA   Witness / Exposure to Domestic Violence: No   Protective Services Involvement: No  Witness to [employer] Violence:  No   Family History:  Family History  Problem Relation Age of Onset   Diabetes Maternal Grandmother    Hypertension Maternal Grandmother    Hyperlipidemia Maternal Grandmother    Hypothyroidism Maternal Grandmother    Heart disease Maternal Grandmother    Diabetes Mother    Hypertension Mother    Hypothyroidism Mother    Bipolar disorder Mother    Heart disease Mother    Kidney disease Mother    Thyroid disease Mother    Depression Mother    Anxiety disorder Mother    Drug abuse Mother    Obesity Mother    Alcohol abuse Father    Depression Daughter    Breast cancer Neg Hx    Living situation: the patient lives with their spouse  Sexual Orientation: Straight  Relationship Status: married  Name of spouse / other:Dan (~11 years) If a parent, number of children / ages: Melina (64) (Depression, ADHD)  Support Systems: spouse friends  Museum/gallery curator Stress:  Yes   Income/Employment/Disability: Employment  Armed forces logistics/support/administrative officer: No   Educational History: Education: college graduate  Religion/Sprituality/World View: Christian  Any cultural differences that may affect / interfere with treatment:  not applicable   Recreation/Hobbies: Volunteering, rescue dogs and cats, care for animals.  Stressors: Financial difficulties   Health  problems    Strengths: Supportive Relationships, Family, Friends, Financial controller, Spirituality, Hopefulness, Conservator, museum/gallery, and Able to Communicate Effectively  Barriers:  Finances and mood.    Legal History: Pending legal issue / charges: The patient has no significant history of legal issues. History of legal issue / charges:  NA  Medical History/Surgical History: reviewed Past Medical  History:  Diagnosis Date   Adult hypothyroidism 06/21/2012   Anxiety    Back pain    Benign paroxysmal positional nystagmus    Bilateral polycystic ovarian syndrome 06/21/2012   Biliary calculus with cholecystitis 08/14/2012   Bipolar 1 disorder (HCC)    BP (high blood pressure) 02/14/2014   Bronchitis    Constipation    COVID-19 07/2020   Depression    Diabetes mellitus ([place]) 08/14/2012   Diabetes mellitus without complication ([place])    pre-diabetic   Dysrhythmia    wore heart monitor 2016. Corrected by changing Levothyroxine dose.   Edema of both lower extremities    Gallbladder problem    GERD (gastroesophageal reflux disease)    Heart murmur    followed by PCP-AS A CHILD-ASYMPTOMATIC   Hyperlipidemia    Hyperprolactinemia ([place]) 06/21/2012   Hypertension    Hypothyroid    Hypoxia 04/17/2018   Joint pain    Kidney problem    Meningioma (HCC)    Motion sickness    carnival rides   Neuropathy    PONV (postoperative nausea and vomiting)    Low BP after sinus surgery. WAKES UP CRYING   Shortness of breath dyspnea    stairs. related to wt.   Sleep apnea    has CPAP. has not used since 2011   Swallowing difficulty    Vertigo 2016   none recently   Vitamin D deficiency     Past Surgical History:  Procedure Laterality Date   CHOLECYSTECTOMY     COLONOSCOPY WITH PROPOFOL N/A 06/19/2015   Procedure: COLONOSCOPY WITH PROPOFOL;  Surgeon: [name], MD;  Location: [facility];  Service: Endoscopy;  Laterality: N/A;  Diabetic - oral meds    DENTAL SURGERY     ESOPHAGOGASTRODUODENOSCOPY (EGD) WITH PROPOFOL N/A 01/16/2021   Procedure: ESOPHAGOGASTRODUODENOSCOPY (EGD) WITH PROPOFOL;  Surgeon: [name], MD;  Location: [facility];  Service: Endoscopy;  Laterality: N/A;  Diabetic - oral meds   ETHMOIDECTOMY Bilateral 04/17/2018   Procedure: ETHMOIDECTOMY;  Surgeon: [name], MD;  Location: [facility];  Service: ENT;  Laterality: Bilateral;    GALLBLADDER SURGERY     IMAGE GUIDED SINUS SURGERY N/A 04/17/2018   Procedure: IMAGE GUIDED SINUS SURGERY;  Surgeon: [name], MD;  Location: [facility];  Service: ENT;  Laterality: N/A;   MAXILLARY ANTROSTOMY Bilateral 04/17/2018   Procedure: MAXILLARY ANTROSTOMY;  Surgeon: [name], MD;  Location: [facility];  Service: ENT;  Laterality: Bilateral;   NASAL SEPTOPLASTY W/ TURBINOPLASTY Bilateral 04/17/2018   Procedure: NASAL SEPTOPLASTY WITH TURBINATE REDUCTION;  Surgeon: [name], MD;  Location: [facility];  Service: ENT;  Laterality: Bilateral;   TOTAL ABDOMINAL HYSTERECTOMY  2012   cervical dysplasia/ovaries remian   TRANSURETHRAL RESECTION OF BLADDER TUMOR N/A 04/17/2018   Procedure: NTRANSURETHRAL RESECTION OF BLADDER TUMOR (TURBT) WITH GEMCITABINE;  Surgeon: [name], MD;  Location: [facility];  Service: Urology;  Laterality: N/A;    Medications: Current Outpatient Medications  Medication Sig Dispense Refill   acetaminophen (TYLENOL) 500 MG tablet Take 500 mg by mouth every 6 (six) hours as needed.     amLODipine (NORVASC)  5 MG tablet Take 1 tablet (5 mg total) by mouth daily. 90 tablet 4   Ascorbic Acid (VITAMIN C) 1000 MG tablet Take 1,000 mg by mouth daily.     atorvastatin (LIPITOR) 20 MG tablet Take 1 tablet (20 mg total) by mouth daily. 90 tablet 4   Cholecalciferol 5000 units capsule Take 1 capsule (5,000 Units total) by mouth daily.     CONTOUR NEXT TEST test strip USE TO CHECK BS UP TO 3 TIMES DAILY FOR DIABETES DX: E11.9 100 strip 12   Dulaglutide (TRULICITY) 3 RC/1.6LA SOPN Inject 3 mg as directed once a week. 6 mL 4   EPINEPHrine 0.3 mg/0.3 mL IJ SOAJ injection Inject 0.3 mg into the muscle as needed for anaphylaxis. 1 each 4   hydrocortisone (ANUSOL-HC) 25 MG suppository Place 1 suppository (25 mg total) rectally 2 (two) times daily. 12 suppository 0   ipratropium (ATROVENT) 0.06 % nasal spray Place 2 sprays into both nostrils 4 (four) times daily. 15 mL 12    lamoTRIgine (LAMICTAL) 100 MG tablet Take 100 mg by mouth daily.     lamoTRIgine (LAMICTAL) 150 MG tablet Take 150 mg by mouth daily.     levothyroxine (SYNTHROID) 150 MCG tablet Take 1 tablet (150 mcg total) by mouth daily. 90 tablet 4   losartan (COZAAR) 50 MG tablet Take 2 tablets (100 mg total) by mouth daily. 180 tablet 4   metFORMIN (GLUCOPHAGE) 500 MG tablet Take 2 tablets (1,000 mg total) by mouth 2 (two) times daily. 360 tablet 4   mometasone (ELOCON) 0.1 % cream SMARTSIG:1 Application Topical     nystatin (MYCOSTATIN/NYSTOP) powder Apply 1 application. topically 3 (three) times daily. 15 g 4   Omega-3 Fatty Acids (FISH OIL) 1200 MG CAPS Take 1,200 mg by mouth daily.      omeprazole (PRILOSEC) 40 MG capsule TAKE 1 CAPSULE BY MOUTH EVERY DAY 90 capsule 4   No current facility-administered medications for this visit.    Allergies  Allergen Reactions   Hctz [Hydrochlorothiazide] Cough   Lisinopril Cough   Latex Rash    Condoms only   Prednisone Anxiety    Paranoia    Diagnoses:  Bipolar I disorder, single manic episode ([place])  Plan of Care: Outpatient Therapy.   Narrative:   [name] participated from home, via video, and consented to treatment. Therapist participated from office. We met online due to [place] pandemic.   No med changes (DrMarland Kitchen Wilnette Kales - [facility])  Had a neurologist due to memory issues. She noted being distracted which affects her recall of words. She noted going to speech therapist. Discussed a possibility of ADHD.   Overeating, increased weight 10# Consuming more caffeine  Work stressors  Worry regarding daughter's functioning and mood.   Mother's life, suicide, life dirty and took significant medicine. Secluded.   Stressors with volunteering.   Possible upcoming marriage to [place].   Be happy and live a good life. Getting adjusted thinking more positive and being okay with having positive thoughts and feelings.   [name], LCSW

## 2022-03-03 NOTE — Patient Instructions (Signed)

## 2022-03-04 ENCOUNTER — Ambulatory Visit (INDEPENDENT_AMBULATORY_CARE_PROVIDER_SITE_OTHER): Payer: Commercial Managed Care - HMO | Admitting: Nurse Practitioner

## 2022-03-04 ENCOUNTER — Encounter: Payer: Self-pay | Admitting: Nurse Practitioner

## 2022-03-04 VITALS — BP 146/90 | HR 74 | Temp 98.1°F | Ht 66.0 in | Wt 281.2 lb

## 2022-03-04 DIAGNOSIS — G4733 Obstructive sleep apnea (adult) (pediatric): Secondary | ICD-10-CM

## 2022-03-04 DIAGNOSIS — E785 Hyperlipidemia, unspecified: Secondary | ICD-10-CM

## 2022-03-04 DIAGNOSIS — E1169 Type 2 diabetes mellitus with other specified complication: Secondary | ICD-10-CM | POA: Diagnosis not present

## 2022-03-04 DIAGNOSIS — R413 Other amnesia: Secondary | ICD-10-CM

## 2022-03-04 DIAGNOSIS — E1129 Type 2 diabetes mellitus with other diabetic kidney complication: Secondary | ICD-10-CM | POA: Diagnosis not present

## 2022-03-04 DIAGNOSIS — F309 Manic episode, unspecified: Secondary | ICD-10-CM

## 2022-03-04 DIAGNOSIS — R809 Proteinuria, unspecified: Secondary | ICD-10-CM

## 2022-03-04 DIAGNOSIS — N62 Hypertrophy of breast: Secondary | ICD-10-CM | POA: Insufficient documentation

## 2022-03-04 DIAGNOSIS — Z20822 Contact with and (suspected) exposure to covid-19: Secondary | ICD-10-CM

## 2022-03-04 DIAGNOSIS — E063 Autoimmune thyroiditis: Secondary | ICD-10-CM

## 2022-03-04 DIAGNOSIS — Z9989 Dependence on other enabling machines and devices: Secondary | ICD-10-CM

## 2022-03-04 DIAGNOSIS — I152 Hypertension secondary to endocrine disorders: Secondary | ICD-10-CM

## 2022-03-04 DIAGNOSIS — M545 Low back pain, unspecified: Secondary | ICD-10-CM

## 2022-03-04 DIAGNOSIS — D329 Benign neoplasm of meninges, unspecified: Secondary | ICD-10-CM

## 2022-03-04 DIAGNOSIS — E1159 Type 2 diabetes mellitus with other circulatory complications: Secondary | ICD-10-CM

## 2022-03-04 DIAGNOSIS — G8929 Other chronic pain: Secondary | ICD-10-CM

## 2022-03-04 LAB — BAYER DCA HB A1C WAIVED: HB A1C (BAYER DCA - WAIVED): 6.2 % — ABNORMAL HIGH (ref 4.8–5.6)

## 2022-03-04 MED ORDER — METFORMIN HCL 500 MG PO TABS: 500.0000 mg | ORAL_TABLET | Freq: Two times a day (BID) | ORAL | 4 refills | Status: DC

## 2022-03-04 MED ORDER — TRULICITY 4.5 MG/0.5ML ~~LOC~~ SOAJ: 4.5000 mg | SUBCUTANEOUS | 4 refills | Status: DC

## 2022-03-04 NOTE — Assessment & Plan Note (Addendum)
Chronic, ongoing with BP above goal in office today -- often at goal on checks, suspect related to not feeling well at this time and will recheck next visit.  Continue current medication regimen and adjust as needed.  Focus on DASH diet and regular exercise at home.  Recommend she check BP at few days a week at home and document for provider.  Losartan for kidney protection, urine ALB 10 on 08/18/21.  LABS: CMP.  Return in 3 months.

## 2022-03-04 NOTE — Assessment & Plan Note (Signed)
Chronic, ongoing with A1c 6.2% today, remains stable, and urine ALB 10 (February 2023).  Continue Losartan for kidney protection.  Will reduce Metformin to 500 MG BID + increase Trulicity to 4.5 MG weekly -- goal is to discontinue Metformin in future and possibly transition to [facility] for diabetes and weight loss assist.  Educated on BS goals in morning and 2 hours after meals.  Return in 3 months.

## 2022-03-04 NOTE — Assessment & Plan Note (Signed)
BMI 45.39 with T2DM, HTN/HLD.  Recommended eating smaller high protein, low fat meals more frequently and exercising 30 mins a day 5 times a week with a goal of 10-15lb weight loss in the next 3 months. Patient voiced their understanding and motivation to adhere to these recommendations.

## 2022-03-04 NOTE — Assessment & Plan Note (Signed)
Chronic, ongoing.  Continue Levothyroxine 150 MCG daily.  Educated her on this.  Thyroid labs up to date. 

## 2022-03-04 NOTE — Assessment & Plan Note (Signed)
Chronic, stable with consistent use of CPAP.  Continue to use 100%. 

## 2022-03-04 NOTE — Assessment & Plan Note (Signed)
Chronic, stable.  Denies SI/HI.  Followed by psychiatry, will continue this collaboration and current medication regimen as prescribed by them.  Lamictal level up to date.  Return in 3 months.

## 2022-03-04 NOTE — Assessment & Plan Note (Signed)
Refer to macromastia plan of care.

## 2022-03-04 NOTE — Assessment & Plan Note (Signed)
Chronic, ongoing with A1c 6.2% today, remains stable, and urine ALB 10 (February 2023).  Continue Losartan for kidney protection.  Will reduce Metformin to 500 MG BID + increase Trulicity to 4.5 MG weekly -- goal is to discontinue Metformin in future and possibly transition to [facility] for diabetes and weight loss assist.  Educated on BS goals in morning and 2 hours after meals.  Return in 3 months.

## 2022-03-04 NOTE — Assessment & Plan Note (Signed)
Chronic, ongoing.  Continue on statin which is offering benefit.  Lipid panel today.  Return in 3 months. 

## 2022-03-04 NOTE — Assessment & Plan Note (Signed)
Improving, suspected to be related to hormonal changes with per menopause.  Continue to collaborate with neurology and speech therapy.  Recent notes reviewed.

## 2022-03-04 NOTE — Assessment & Plan Note (Signed)
Followed by neurology, continue collaboration with neurology team and recent notes reviewed. 

## 2022-03-04 NOTE — Assessment & Plan Note (Signed)
Ongoing with back and shoulder pain ongoing, chronic issue.  Wears a 48 H bra size.  Would benefit from breast reduction to reduce pain and allow for ability to exercise as she would like to do regularly.  Referral to plastic surgery placed after discussion with patient.

## 2022-03-04 NOTE — Progress Notes (Signed)
BP (!) 146/90   Pulse 74   Temp 98.1 F (36.7 C) (Oral)   Ht '5\' 6"'$  (1.676 m)   Wt 281 lb 3.2 oz (127.6 kg)   SpO2 99%   BMI 45.39 kg/m    Subjective:    Patient ID: Lori Knight, female    DOB: 1971/02/28, 51 y.o.   MRN: [id number]  HPI: Lori Knight is a 51 y.o. female  Chief Complaint  Patient presents with   Diabetes   Hyperlipidemia   Hypertension   Manic Behavior   Hashimoto's Thyroiditis   Covid Exposure    Patient says she was having coffee with a friend Tuesday and says her friend husband has tested positive for COVID. Patient says she is now having symptoms of body-aches, headaches, denies having any fever or chills. Patient denies taking any medication over the counter. Patient says    COVID EXPOSURE Had exposure to Covid with a friend recently, Tuesday -- her friend's husband had Covid (wife got sick, but never tested positive) Fever: no Cough: no Shortness of breath: no Wheezing: no Chest pain: no Chest tightness: no Chest congestion: no Nasal congestion: no Runny nose: no Post nasal drip: no Sneezing: no Sore throat: no Swollen glands: no Sinus pressure: no Headache: yes Face pain: no Toothache: no Ear pain:none Ear pressure: none Eyes red/itching:no Eye drainage/crusting: no  Vomiting: no Rash: no Fatigue: yes Sick contacts: yes Strep contacts: no  Context: stable Recurrent sinusitis: no Relief with OTC cold/cough medications: no  Treatments attempted: none    DIABETES A1c last check 6.2%. Continues on Metformin 1000 MG in morning and 500 MG at night and Trulicity 3 MG weekly. CPAP being used consistently at home.  She endorses being uncomfortable with weight gain -- shoulders and upper back.  Bra size is a 48 H -- does endorses discomfort with this. Hypoglycemic episodes:no Polydipsia/polyuria: occasional Visual disturbance: no Chest pain: no Paresthesias: no Glucose Monitoring: no             Accucheck frequency: BID              Fasting glucose: 150-160 in morning             Post prandial: after meals in green level             Evening:             Before meals: Taking Insulin?: no             Long acting insulin:             Short acting insulin: Blood Pressure Monitoring: not checking Retinal Examination: Up to Date -- [facility] [place] Exam: Up to Date Pneumovax: Up to Date Influenza: Up to Date Aspirin: no   HYPERTENSION / HYPERLIPIDEMIA Continues on Losartan 100 MG, Amlodipine 5 MG and Atorvastatin 20 MG QHS. Her last echo in 2019 noted EF 50-55%.     Follows neurosurgery, Dr. Lacinda Axon, for meningioma which remains stable.  She is returns every 24 month follow up visits.  Had visit with Dr. Manuella Ghazi, with neurology on 02/08/22 due to memory changes and word loss --- is going to speech therapy to assist with this (feel it may be more peri menopausal). Duration of hypertension: chronic BP monitoring frequency: not checking BP range:  BP medication side effects: no Duration of hyperlipidemia: chronic Aspirin: no Recent stressors: no Recurrent headaches: no Visual changes: no Palpitations: no Dyspnea: no Chest pain:  no Lower extremity edema: no Dizzy/lightheaded: no  The 10-year ASCVD risk score (Arnett DK, et al., 2019) is: 8.3%   Values used to calculate the score:     Age: 41 years     Sex: Female     Is Non-Hispanic African American: No     Diabetic: Yes     Tobacco smoker: No     Systolic Blood Pressure: 742 mmHg     Is BP treated: Yes     HDL Cholesterol: 35 mg/dL     Total Cholesterol: 203 mg/dL   HYPOTHYROIDISM Taking Levothyroxine 150 MCG daily. Thyroid control status:stable Satisfied with current treatment? yes Medication side effects: no Medication compliance: good compliance Etiology of hypothyroidism:  Recent dose adjustment: yes Fatigue: no Cold intolerance: no Heat intolerance: no Weight gain: no Weight loss: no Constipation: occasional Diarrhea/loose  stools: no Palpitations: no Lower extremity edema: no Anxiety/depressed mood: no   BIPOLAR DISORDER Followed by psychiatry and psychology -- currently taking Lamictal. 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 Mood status: stable Satisfied with current treatment?: yes Symptom severity: moderate  Duration of current treatment : chronic Side effects: no Medication compliance: good compliance Psychotherapy/counseling: at present -- with Alene Mires Depressed mood: none Anxious mood: improving with stopping Topamax Anhedonia: no Significant weight loss or gain: no Insomnia: none Fatigue: yes Feelings of worthlessness or guilt: no Impaired concentration/indecisiveness: yes Suicidal ideations: no Hopelessness: no Crying spells: yes    03/04/2022   11:04 AM 01/15/2022    3:37 PM 12/02/2021    8:35 AM 11/17/2021    2:28 PM 09/25/2021    1:28 PM  Depression screen PHQ 2/9  Decreased Interest 1 0 0 1 1  Down, Depressed, Hopeless 1 0 '1 1 1  '$ PHQ - 2 Score 2 0 '1 2 2  '$ Altered sleeping 0 1 0 1 0  Tired, decreased energy '3 2 3 1 1  '$ Change in appetite '3 3 3 1 1  '$ Feeling bad or failure about yourself  3 1 0 1 1  Trouble concentrating '3 2 3 2 1  '$ Moving slowly or fidgety/restless 1 1 0 0 0  Suicidal thoughts 0 0 0 0 0  PHQ-9 Score '15 10 10 8 6  '$ Difficult doing work/chores Somewhat difficult Not difficult at all          03/04/2022   11:04 AM 01/15/2022    3:37 PM 12/02/2021    8:35 AM 11/17/2021    2:28 PM  GAD 7 : Generalized Anxiety Score  Nervous, Anxious, on Edge '2 1 1 1  '$ Control/stop worrying '2 1 1 1  '$ Worry too much - different things '2 1 1 1  '$ Trouble relaxing '2 1 1 1  '$ Restless '2 1 1 1  '$ Easily annoyed or irritable '3 1 1 1  '$ Afraid - awful might happen 2 0 0 0  Total GAD 7 Score '15 6 6 6  '$ Anxiety Difficulty Somewhat difficult Not difficult at all  Not difficult at all   Relevant past medical, surgical, family and social history reviewed and updated as indicated. Interim medical  history since our last visit reviewed. Allergies and medications reviewed and updated.  Review of Systems  Constitutional:  Positive for chills and fatigue. Negative for activity change, appetite change, diaphoresis and fever.  Respiratory:  Negative for cough, chest tightness and shortness of breath.   Cardiovascular:  Negative for chest pain, palpitations and leg swelling.  Gastrointestinal: Negative.   Endocrine: Negative for cold  intolerance, heat intolerance, polydipsia, polyphagia and polyuria.  Musculoskeletal:  Positive for arthralgias.  Neurological:  Positive for headaches. Negative for dizziness, tremors, syncope and weakness.  Psychiatric/Behavioral: Negative.      Per HPI unless specifically indicated above     Objective:    BP (!) 146/90   Pulse 74   Temp 98.1 F (36.7 C) (Oral)   Ht '5\' 6"'$  (3.893 m)   Wt 281 lb 3.2 oz (127.6 kg)   SpO2 99%   BMI 45.39 kg/m   Wt Readings from Last 3 Encounters:  03/04/22 281 lb 3.2 oz (127.6 kg)  01/15/22 278 lb (126.1 kg)  12/21/21 280 lb 9.6 oz (127.3 kg)    Physical Exam Vitals and nursing note reviewed.  Constitutional:      General: She is awake. She is not in acute distress.    Appearance: She is well-developed and well-groomed. She is morbidly obese. She is not ill-appearing.  HENT:     Head: Normocephalic.     Right Ear: Hearing, tympanic membrane, ear canal and external ear normal.     Left Ear: Hearing, tympanic membrane, ear canal and external ear normal.     Nose: Nose normal.     Right Sinus: No maxillary sinus tenderness or frontal sinus tenderness.     Left Sinus: No maxillary sinus tenderness or frontal sinus tenderness.     Mouth/Throat:     Mouth: Mucous membranes are moist.     Pharynx: No pharyngeal swelling, oropharyngeal exudate or posterior oropharyngeal erythema.  Eyes:     General: Lids are normal.        Right eye: No discharge.        Left eye: No discharge.     Conjunctiva/sclera:  Conjunctivae normal.     Pupils: Pupils are equal, round, and reactive to light.  Neck:     Thyroid: No thyromegaly.     Vascular: No carotid bruit.  Cardiovascular:     Rate and Rhythm: Normal rate and regular rhythm.     Heart sounds: Normal heart sounds. No murmur heard.    No gallop.  Pulmonary:     Effort: Pulmonary effort is normal. No accessory muscle usage or respiratory distress.     Breath sounds: Normal breath sounds.  Abdominal:     General: Bowel sounds are normal. There is no distension.     Palpations: Abdomen is soft. There is no hepatomegaly.     Tenderness: There is no abdominal tenderness. There is no right CVA tenderness or left CVA tenderness.     Hernia: No hernia is present.  Musculoskeletal:     Cervical back: Normal range of motion and neck supple.     Right lower leg: No edema.     Left lower leg: No edema.  Lymphadenopathy:     Cervical: No cervical adenopathy.  Skin:    General: Skin is warm and dry.  Neurological:     Mental Status: She is alert and oriented to person, place, and time.  Psychiatric:        Attention and Perception: Attention normal.        Mood and Affect: Mood normal.        Speech: Speech normal.        Behavior: Behavior normal. Behavior is cooperative.        Thought Content: Thought content normal.     Results for orders placed or performed in visit on 03/04/22  Bayer [place] Hb A1c Waived  Result Value Ref Range   HB A1C (BAYER DCA - WAIVED) 6.2 (H) 4.8 - 5.6 %      Assessment & Plan:   Problem List Items Addressed This Visit       Cardiovascular and Mediastinum   Hypertension associated with diabetes ([place])    Chronic, ongoing with BP above goal in office today -- often at goal on checks, suspect related to not feeling well at this time and will recheck next visit.  Continue current medication regimen and adjust as needed.  Focus on DASH diet and regular exercise at home.  Recommend she check BP at few days a week at  home and document for provider.  Losartan for kidney protection, urine ALB 10 on 08/18/21.  LABS: CMP.  Return in 3 months.      Relevant Medications   Dulaglutide (TRULICITY) 4.5 EQ/6.8TM SOPN   metFORMIN (GLUCOPHAGE) 500 MG tablet   Other Relevant Orders   Bayer DCA Hb A1c Waived (Completed)   Comprehensive metabolic panel     Respiratory   OSA on CPAP    Chronic, stable with consistent use of CPAP.  Continue to use 100%.        Endocrine   Hashimoto's thyroiditis    Chronic, ongoing.  Continue Levothyroxine 150 MCG daily.  Educated her on this.  Thyroid labs up to date.      Hyperlipidemia associated with type 2 diabetes mellitus (HCC)    Chronic, ongoing.  Continue on statin which is offering benefit.  Lipid panel today.  Return in 3 months.      Relevant Medications   Dulaglutide (TRULICITY) 4.5 HD/6.2IW SOPN   metFORMIN (GLUCOPHAGE) 500 MG tablet   Other Relevant Orders   Bayer DCA Hb A1c Waived (Completed)   Comprehensive metabolic panel   Lipid Panel w/o Chol/HDL Ratio   Type 2 diabetes mellitus with morbid obesity (HCC)    Chronic, ongoing with A1c 6.2% today, remains stable, and urine ALB 10 (February 2023).  Continue Losartan for kidney protection.  Will reduce Metformin to 500 MG BID + increase Trulicity to 4.5 MG weekly -- goal is to discontinue Metformin in future and possibly transition to [facility] for diabetes and weight loss assist.  Educated on BS goals in morning and 2 hours after meals.  Return in 3 months.        Relevant Medications   Dulaglutide (TRULICITY) 4.5 LN/9.8XQ SOPN   metFORMIN (GLUCOPHAGE) 500 MG tablet   Other Relevant Orders   Bayer DCA Hb A1c Waived (Completed)   Comprehensive metabolic panel   Type 2 diabetes mellitus with proteinuria (HCC) - Primary    Chronic, ongoing with A1c 6.2% today, remains stable, and urine ALB 10 (February 2023).  Continue Losartan for kidney protection.  Will reduce Metformin to 500 MG BID + increase Trulicity  to 4.5 MG weekly -- goal is to discontinue Metformin in future and possibly transition to [facility] for diabetes and weight loss assist.  Educated on BS goals in morning and 2 hours after meals.  Return in 3 months.        Relevant Medications   Dulaglutide (TRULICITY) 4.5 JJ/9.4RD SOPN   metFORMIN (GLUCOPHAGE) 500 MG tablet   Other Relevant Orders   Bayer DCA Hb A1c Waived (Completed)   Comprehensive metabolic panel     Nervous and Auditory   Meningioma (HCC)    Followed by neurology, continue collaboration with neurology team and recent notes reviewed.  Other   Bipolar I disorder, single manic episode (HCC)    Chronic, stable.  Denies SI/HI.  Followed by psychiatry, will continue this collaboration and current medication regimen as prescribed by them.  Lamictal level up to date.  Return in 3 months.      Chronic midline low back pain without sciatica    Refer to macromastia plan of care.      Macromastia    Ongoing with back and shoulder pain ongoing, chronic issue.  Wears a 48 H bra size.  Would benefit from breast reduction to reduce pain and allow for ability to exercise as she would like to do regularly.  Referral to plastic surgery placed after discussion with patient.      Relevant Orders   Ambulatory referral to Plastic Surgery   Memory changes    Improving, suspected to be related to hormonal changes with per menopause.  Continue to collaborate with neurology and speech therapy.  Recent notes reviewed.      Obesity, Class III, BMI 40-49.9 (morbid obesity) (HCC)    BMI 45.39 with T2DM, HTN/HLD.  Recommended eating smaller high protein, low fat meals more frequently and exercising 30 mins a day 5 times a week with a goal of 10-15lb weight loss in the next 3 months. Patient voiced their understanding and motivation to adhere to these recommendations.        Relevant Medications   Dulaglutide (TRULICITY) 4.5 QA/8.3MH SOPN   metFORMIN (GLUCOPHAGE) 500 MG tablet    Other Visit Diagnoses     Exposure to COVID-19 virus       Covid testing today and recommend to self quarantine until results return.  OTC medications for symptom control if needed.   Relevant Orders   Novel Coronavirus, NAA ([facility])        Follow up plan: Return in about 3 months (around 06/04/2022) for T2DM, HTN/HL, MOOD, THYROID.

## 2022-03-05 LAB — COMPREHENSIVE METABOLIC PANEL
ALT: 24 IU/L (ref 0–32)
AST: 17 IU/L (ref 0–40)
Albumin/Globulin Ratio: 1.7 (ref 1.2–2.2)
Albumin: 4.2 g/dL (ref 3.8–4.9)
Alkaline Phosphatase: 122 IU/L — ABNORMAL HIGH (ref 44–121)
BUN/Creatinine Ratio: 13 (ref 9–23)
BUN: 11 mg/dL (ref 6–24)
Bilirubin Total: 0.2 mg/dL (ref 0.0–1.2)
CO2: 24 mmol/L (ref 20–29)
Calcium: 8.9 mg/dL (ref 8.7–10.2)
Chloride: 100 mmol/L (ref 96–106)
Creatinine, Ser: 0.88 mg/dL (ref 0.57–1.00)
Globulin, Total: 2.5 g/dL (ref 1.5–4.5)
Glucose: 146 mg/dL — ABNORMAL HIGH (ref 70–99)
Potassium: 4.1 mmol/L (ref 3.5–5.2)
Sodium: 140 mmol/L (ref 134–144)
Total Protein: 6.7 g/dL (ref 6.0–8.5)
eGFR: 79 mL/min/{1.73_m2} (ref >59)

## 2022-03-05 LAB — LIPID PANEL W/O CHOL/HDL RATIO
Cholesterol, Total: 163 mg/dL (ref 100–199)
HDL: 39 mg/dL — ABNORMAL LOW (ref >39)
LDL Chol Calc (NIH): 86 mg/dL (ref 0–99)
Triglycerides: 225 mg/dL — ABNORMAL HIGH (ref 0–149)
VLDL Cholesterol Cal: 38 mg/dL (ref 5–40)

## 2022-03-05 LAB — NOVEL CORONAVIRUS, NAA: SARS-CoV-2, NAA: NOT DETECTED

## 2022-03-05 NOTE — Progress Notes (Signed)
Contacted via [place] afternoon Lori Knight, your labs have returned: - Covid testing is negative.   - Kidney function, creatinine and eGFR, remains normal, as is liver function, AST and ALT.   - Cholesterol levels remain stable, continue statin.  Any questions? Keep being amazing!!  Thank you for allowing me to participate in your care.  I appreciate you. Kindest regards, Eduar Kumpf

## 2022-03-08 ENCOUNTER — Ambulatory Visit: Payer: Commercial Managed Care - HMO | Admitting: Speech Pathology

## 2022-03-08 DIAGNOSIS — R41841 Cognitive communication deficit: Secondary | ICD-10-CM | POA: Diagnosis not present

## 2022-03-08 NOTE — Patient Instructions (Signed)
Strategies for conversations:  -Preplanning! Write yourself a list of keywords. If you have an appointment to show a house, VERBALLY talk yourself through the features you will discuss. Use that opportunity to google any terms you can't think of. Write yourself an overview of the highlights and any specific terminology you want to use  -Multimodal communication: many ways of communicating. Speech is just one. You can use gestures, writing, drawing, images, GOOGLE: "let me show you."  -Describing words:  What group does it belong to?  What do I use it for?  Where can I find it?  What does it LOOK like?  What other words go with it?  What is the 1st sound of the word?  HOMEWORK:  Start making a "glossary" of terminology for real estate (consider separating words into categories/group by your audience, for example a list for contractors, high-end real estate, new Architect, etc).

## 2022-03-09 ENCOUNTER — Encounter: Payer: Commercial Managed Care - HMO | Admitting: Speech Pathology

## 2022-03-09 NOTE — Therapy (Signed)
OUTPATIENT SPEECH LANGUAGE PATHOLOGY TREATMENT   Patient Name: Lori Knight MRN: [id number] DOB:[birth year], 51 y.o., female Today's Date: 03/09/2022  PCP: Marnee Guarneri, NP REFERRING PROVIDER: Jennings Books, MD   End of Session - 03/08/22 1107     Visit Number 2    Number of Visits 5    Date for SLP Re-Evaluation 04/18/22   60 days to allow for scheduling   SLP Start Time 1105    SLP Stop Time  1200    SLP Time Calculation (min) 55 min    Activity Tolerance Patient tolerated treatment well             Past Medical History:  Diagnosis Date   Adult hypothyroidism 06/21/2012   Anxiety    Back pain    Benign paroxysmal positional nystagmus    Bilateral polycystic ovarian syndrome 06/21/2012   Biliary calculus with cholecystitis 08/14/2012   Bipolar 1 disorder (HCC)    BP (high blood pressure) 02/14/2014   Bronchitis    Constipation    COVID-19 07/2020   Depression    Diabetes mellitus ([place]) 08/14/2012   Diabetes mellitus without complication ([place])    pre-diabetic   Dysrhythmia    wore heart monitor 2016. Corrected by changing Levothyroxine dose.   Edema of both lower extremities    Gallbladder problem    GERD (gastroesophageal reflux disease)    Heart murmur    followed by PCP-AS A CHILD-ASYMPTOMATIC   Hyperlipidemia    Hyperprolactinemia ([place]) 06/21/2012   Hypertension    Hypothyroid    Hypoxia 04/17/2018   Joint pain    Kidney problem    Meningioma (HCC)    Motion sickness    carnival rides   Neuropathy    PONV (postoperative nausea and vomiting)    Low BP after sinus surgery. WAKES UP CRYING   Shortness of breath dyspnea    stairs. related to wt.   Sleep apnea    has CPAP. has not used since 2011   Swallowing difficulty    Vertigo 2016   none recently   Vitamin D deficiency    Past Surgical History:  Procedure Laterality Date   CHOLECYSTECTOMY     COLONOSCOPY WITH PROPOFOL N/A 06/19/2015   Procedure: COLONOSCOPY WITH PROPOFOL;  Surgeon:  [name], MD;  Location: [facility];  Service: Endoscopy;  Laterality: N/A;  Diabetic - oral meds    DENTAL SURGERY     ESOPHAGOGASTRODUODENOSCOPY (EGD) WITH PROPOFOL N/A 01/16/2021   Procedure: ESOPHAGOGASTRODUODENOSCOPY (EGD) WITH PROPOFOL;  Surgeon: [name], MD;  Location: [facility];  Service: Endoscopy;  Laterality: N/A;  Diabetic - oral meds   ETHMOIDECTOMY Bilateral 04/17/2018   Procedure: ETHMOIDECTOMY;  Surgeon: [name], MD;  Location: [facility];  Service: ENT;  Laterality: Bilateral;   GALLBLADDER SURGERY     IMAGE GUIDED SINUS SURGERY N/A 04/17/2018   Procedure: IMAGE GUIDED SINUS SURGERY;  Surgeon: [name], MD;  Location: [facility];  Service: ENT;  Laterality: N/A;   MAXILLARY ANTROSTOMY Bilateral 04/17/2018   Procedure: MAXILLARY ANTROSTOMY;  Surgeon: [name], MD;  Location: [facility];  Service: ENT;  Laterality: Bilateral;   NASAL SEPTOPLASTY W/ TURBINOPLASTY Bilateral 04/17/2018   Procedure: NASAL SEPTOPLASTY WITH TURBINATE REDUCTION;  Surgeon: [name], MD;  Location: [facility];  Service: ENT;  Laterality: Bilateral;   TOTAL ABDOMINAL HYSTERECTOMY  2012   cervical dysplasia/ovaries remian   TRANSURETHRAL RESECTION OF BLADDER TUMOR N/A 04/17/2018   Procedure: NTRANSURETHRAL RESECTION OF BLADDER TUMOR (TURBT) WITH  GEMCITABINE;  Surgeon: [name], MD;  Location: [facility];  Service: Urology;  Laterality: N/A;   Patient Active Problem List   Diagnosis Date Noted   Macromastia 03/04/2022   Memory changes 01/15/2022   Chronic midline low back pain without sciatica 01/15/2022   Hemorrhoids, external 12/02/2021   Aortic atherosclerosis ([place]) 09/30/2020   Type 2 diabetes mellitus with proteinuria ([place]) 02/20/2020   Meningioma ([place]) 12/24/2019   Hyperlipidemia associated with type 2 diabetes mellitus ([place]) 08/13/2019   Gastroesophageal reflux disease without esophagitis 06/20/2019   Type 2 diabetes mellitus with morbid obesity  ([place]) 12/01/2015   OSA on CPAP 09/22/2015   Internal hemorrhoids 08/28/2015   Vitamin D deficiency 08/28/2015   Obesity, Class III, BMI 40-49.9 (morbid obesity) ([place]) 08/27/2015   Hypertension associated with diabetes ([place]) 02/14/2014   Benign paroxysmal positional nystagmus 02/14/2014   Benign neoplasm of kidney 08/14/2012   Bipolar I disorder, single manic episode ([place]) 08/14/2012   Hashimoto's thyroiditis 06/21/2012   Bilateral polycystic ovarian syndrome 06/21/2012    ONSET DATE: referral 02/10/22; pt reports gradual onset beginning in 2019   REFERRING DIAG: cognitive disorder  THERAPY DIAG:  Cognitive communication deficit  Rationale for Evaluation and Treatment Rehabilitation  SUBJECTIVE:   SUBJECTIVE STATEMENT: Pt reports that since evaluation a few weeks ago, she is trying to relax when anomia occurs and feels this is happening less often.  Pt accompanied by: self  PERTINENT HISTORY: Lori Knight is a 51 y.o. female referred by Dr. Manuella Ghazi for cognitive impairments. Past medical history includes bipolar disorder, anxiety, depression, and right frontal meningioma dx 2020 which is being managed by surveillance imaging; per neurosurgery "risk of radiation and resection at this time outweigh any benefit given the small size and asymptomatic nature." Pt also self-reports concern for ADHD but has not been formally evaluated for this.  DIAGNOSTIC FINDINGS: MRI 09/08/21: A meningioma overlying the lateral right frontal lobe has slightly increased in size as compared to the MRI of 01/09/2019, now measuring 14 x 10 x 13 mm. As before, there is mild local mass effect upon the underlying right frontal lobe without underlying parenchymal edema.  PAIN:  Are you having pain? No    PATIENT GOALS improve communication and cognitive function  OBJECTIVE:   TODAY'S TREATMENT: Skilled ST session focused on introducing compensations for anomia, with training in semantic feature analysis.  Patient did not log any communication breakdowns between evaluation and today, so utilized confrontation naming to simulate breakdowns. Pt named 6/6 items correctly, and was able to generate 4-6 descriptors for each object with occasional min cues. 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 Educated on pre-planning and using written keywords to guide her when interacting in the professional space. Pt agreed this would be beneficial; for homework to begin working on a "glossary" of common terminology she uses with clients and other professionals    PATIENT EDUCATION: Education details: anomia compensations, conversation preplanning Person educated: Patient Education method: Explanation Education comprehension: verbalized understanding     GOALS: Goals reviewed with patient? Yes   LONG TERM GOALS: Target date: 04/18/22 Pt will verbalize and implement 4 attention and memory strategies to aid daily functioning for ADLs and IADLs  Baseline:  Goal status: INITIAL  2.  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 Patient reports feeling anxious when she hesitates and is unable to come up with a word in conversations with clients, and has difficulty remembering details if she does not write things down. Pt receptive to training in compensatory strategies today and appears eager to implement suggestions to improve communication in professional settings today. Continue brief course of ST  with focus on compensation strategy training to improve her communication and cognitive functioning in work and social settings. Patient reports concerns for ADHD for which she may benefit from neuropsychology assessment for formal testing.  OBJECTIVE IMPAIRMENTS include  functional deficits in wordfinding and attention . These impairments are limiting patient from effectively communicating at home and in community. Factors affecting potential to achieve goals and functional outcome are co-morbidities.. Patient will benefit from skilled SLP services to address above impairments and improve overall function.  REHAB POTENTIAL: Fair -good  PLAN: SLP FREQUENCY: 1x/week  SLP DURATION: 4 weeks  PLANNED INTERVENTIONS: Language facilitation, Environmental controls, Internal/external aids, Functional tasks, Multimodal communication approach, SLP instruction and feedback, Compensatory strategies, and Patient/family education   Deneise Lever, MS, Actor [phone]

## 2022-03-11 ENCOUNTER — Encounter: Payer: Commercial Managed Care - HMO | Admitting: Speech Pathology

## 2022-03-15 ENCOUNTER — Ambulatory Visit (INDEPENDENT_AMBULATORY_CARE_PROVIDER_SITE_OTHER): Payer: Commercial Managed Care - HMO | Admitting: Psychology

## 2022-03-15 DIAGNOSIS — F309 Manic episode, unspecified: Secondary | ICD-10-CM | POA: Diagnosis not present

## 2022-03-15 NOTE — Progress Notes (Signed)
[facility] Counselor/Therapist Progress Note  Patient ID: Lori Knight, MRN: [id number],    Date: 03/15/2022  Time Spent: 8:05 am - 9:49 am:   44 minutes  Treatment Type: Individual Therapy.  Reported Symptoms: Anxiety   Mental Status Exam: Appearance:  Neat and Well Groomed     Behavior: Appropriate  Motor: Normal  Speech/Language:  Clear and Coherent and Normal Rate  Affect: Congruent  Mood: normal  Thought process: normal  Thought content:   WNL  Sensory/Perceptual disturbances:   WNL  Orientation: oriented to person, place, time/date, and situation  Attention: Good  Concentration: Good  Memory: [name] of knowledge:  Good  Insight:   Good  Judgment:  Good  Impulse Control: Good   Subjective:  Lori Knight participated from home, via video and consented to treatment. Therapist participated from home office. We met online due to [place] pandemic. Lori Knight reviewed the events of the past week. We reviewed numerous treatment approaches including CBT, BA, Problem Solving, and Solution focused therapy. Psych-education regarding the [name]'s diagnosis of Bipolar I disorder, single manic episode ([name]). was provided during the session. We discussed Lori Knight goals treatment goals which include setting boundaries for self and others, consistently engaging in self-care, managing overall symptoms, reducing and actively managing negative self-talk (consequences and boundaries, as well), building mindfulness and self-awareness (depressive and manic episodes), & developing day-to-day routine and structure. Lori Knight provided verbal approval of the treatment plan.   Interventions:  Psychoeducation and Goal Setting.   Diagnosis:Bipolar I disorder, single manic episode ([name])  Plan: Patient is to use CBT, ACT, BA, mindfulness, and coping skills to help manage decrease symptoms associated with their diagnosis. (Target Date 03/16/23)   Long-term goal:   Reduce  overall level, frequency, and intensity of the feelings of depression and anxiety evidenced by negative self-talk, rumination, and poor self-esteem  from 6 to 7 days/week to 0 to 1 days/week per client report for at least 3 consecutive months. Engage in consistent self-care and mindfulness.   Short-term goal:  Verbally express understanding of the relationship between feelings of depression, bipolar, anxiety and their impact on thinking patterns and behaviors.  Verbalize an understanding of the role that distorted thinking plays in creating fears, excessive worry, and ruminations.   Lori Knight participated in the creation of the treatment plan)  [name], LCSW

## 2022-03-17 ENCOUNTER — Ambulatory Visit: Payer: Commercial Managed Care - HMO | Admitting: Speech Pathology

## 2022-03-18 ENCOUNTER — Ambulatory Visit: Payer: Commercial Managed Care - HMO | Admitting: Plastic Surgery

## 2022-03-18 ENCOUNTER — Encounter: Payer: Self-pay | Admitting: Plastic Surgery

## 2022-03-18 VITALS — BP 147/99 | HR 83 | Ht 66.0 in | Wt 280.8 lb

## 2022-03-18 DIAGNOSIS — M542 Cervicalgia: Secondary | ICD-10-CM

## 2022-03-18 DIAGNOSIS — M545 Low back pain, unspecified: Secondary | ICD-10-CM

## 2022-03-18 DIAGNOSIS — N62 Hypertrophy of breast: Secondary | ICD-10-CM | POA: Diagnosis not present

## 2022-03-18 DIAGNOSIS — Z1231 Encounter for screening mammogram for malignant neoplasm of breast: Secondary | ICD-10-CM

## 2022-03-18 DIAGNOSIS — Z6841 Body Mass Index (BMI) 40.0 and over, adult: Secondary | ICD-10-CM

## 2022-03-18 DIAGNOSIS — M546 Pain in thoracic spine: Secondary | ICD-10-CM

## 2022-03-19 NOTE — Progress Notes (Signed)
Referring Provider [name], NP [place],  [place] [address]   CC:  Breast hypertrophy and back pain   Lori Knight is an 51 y.o. female.  HPI:   The patient is a 51 y.o. female with a history of mammary hyperplasia for several years.  She has extremely large breasts causing symptoms that include the following: Back pain in the upper and lower back, including neck pain. She pulls or pins her bra straps to provide better lift and relief of the pressure and pain. She notices relief by holding her breast up manually.  Her shoulder straps cause grooves and pain and pressure that requires padding for relief. Pain medication is sometimes required with motrin and tylenol.  She has used nystatin under her breasts.  She did PT in 2022 with [facility] in [place].  Activities that are hindered by enlarged breasts include: exercise and running.  She has tried supportive clothing as well as fitted bras without improvement.     Mammogram history: 2021, normal.  Family history of breast cancer: None.  Tobacco use: Former smoker but quit in 2004.   The patient expresses the desire to pursue surgical intervention.  She has diabetes but her last hemoglobin A1c was 6.2.  The BMI = 45.  Preoperative bra size = G cup.   Allergies  Allergen Reactions   Hctz [Hydrochlorothiazide] Cough   Lisinopril Cough   Latex Rash    Condoms only   Prednisone Anxiety    Paranoia    Outpatient Encounter Medications as of 03/18/2022  Medication Sig   acetaminophen (TYLENOL) 500 MG tablet Take 500 mg by mouth every 6 (six) hours as needed.   amLODipine (NORVASC) 5 MG tablet Take 1 tablet (5 mg total) by mouth daily.   Ascorbic Acid (VITAMIN C) 1000 MG tablet Take 1,000 mg by mouth daily.   atorvastatin (LIPITOR) 20 MG tablet Take 1 tablet (20 mg total) by mouth daily.   Cholecalciferol 5000 units capsule Take 1 capsule (5,000 Units total) by mouth daily.   CONTOUR NEXT TEST test strip USE TO CHECK BS  UP TO 3 TIMES DAILY FOR DIABETES DX: E11.9   Dulaglutide (TRULICITY) 4.5 RS/8.5IO SOPN Inject 4.5 mg as directed once a week.   EPINEPHrine 0.3 mg/0.3 mL IJ SOAJ injection Inject 0.3 mg into the muscle as needed for anaphylaxis.   hydrocortisone (ANUSOL-HC) 25 MG suppository Place 1 suppository (25 mg total) rectally 2 (two) times daily.   ipratropium (ATROVENT) 0.06 % nasal spray Place 2 sprays into both nostrils 4 (four) times daily.   lamoTRIgine (LAMICTAL) 150 MG tablet Take 150 mg by mouth 2 (two) times daily.   levothyroxine (SYNTHROID) 150 MCG tablet Take 1 tablet (150 mcg total) by mouth daily.   losartan (COZAAR) 50 MG tablet Take 2 tablets (100 mg total) by mouth daily.   metFORMIN (GLUCOPHAGE) 500 MG tablet Take 1 tablet (500 mg total) by mouth 2 (two) times daily.   mometasone (ELOCON) 0.1 % cream SMARTSIG:1 Application Topical   nystatin (MYCOSTATIN/NYSTOP) powder Apply 1 application. topically 3 (three) times daily.   Omega-3 Fatty Acids (FISH OIL) 1200 MG CAPS Take 1,200 mg by mouth daily.    omeprazole (PRILOSEC) 40 MG capsule TAKE 1 CAPSULE BY MOUTH EVERY DAY   No facility-administered encounter medications on file as of 03/18/2022.     Past Medical History:  Diagnosis Date   Adult hypothyroidism 06/21/2012   Anxiety    Back pain  Benign paroxysmal positional nystagmus    Bilateral polycystic ovarian syndrome 06/21/2012   Biliary calculus with cholecystitis 08/14/2012   Bipolar 1 disorder (HCC)    BP (high blood pressure) 02/14/2014   Bronchitis    Constipation    COVID-19 07/2020   Depression    Diabetes mellitus ([place]) 08/14/2012   Diabetes mellitus without complication ([place])    pre-diabetic   Dysrhythmia    wore heart monitor 2016. Corrected by changing Levothyroxine dose.   Edema of both lower extremities    Gallbladder problem    GERD (gastroesophageal reflux disease)    Heart murmur    followed by PCP-AS A CHILD-ASYMPTOMATIC   Hyperlipidemia     Hyperprolactinemia ([place]) 06/21/2012   Hypertension    Hypothyroid    Hypoxia 04/17/2018   Joint pain    Kidney problem    Meningioma (HCC)    Motion sickness    carnival rides   Neuropathy    PONV (postoperative nausea and vomiting)    Low BP after sinus surgery. WAKES UP CRYING   Shortness of breath dyspnea    stairs. related to wt.   Sleep apnea    has CPAP. has not used since 2011   Swallowing difficulty    Vertigo 2016   none recently   Vitamin D deficiency     Past Surgical History:  Procedure Laterality Date   CHOLECYSTECTOMY     COLONOSCOPY WITH PROPOFOL N/A 06/19/2015   Procedure: COLONOSCOPY WITH PROPOFOL;  Surgeon: [name], MD;  Location: [facility];  Service: Endoscopy;  Laterality: N/A;  Diabetic - oral meds    DENTAL SURGERY     ESOPHAGOGASTRODUODENOSCOPY (EGD) WITH PROPOFOL N/A 01/16/2021   Procedure: ESOPHAGOGASTRODUODENOSCOPY (EGD) WITH PROPOFOL;  Surgeon: [name], MD;  Location: [facility];  Service: Endoscopy;  Laterality: N/A;  Diabetic - oral meds   ETHMOIDECTOMY Bilateral 04/17/2018   Procedure: ETHMOIDECTOMY;  Surgeon: [name], MD;  Location: [facility];  Service: ENT;  Laterality: Bilateral;   GALLBLADDER SURGERY     IMAGE GUIDED SINUS SURGERY N/A 04/17/2018   Procedure: IMAGE GUIDED SINUS SURGERY;  Surgeon: [name], MD;  Location: [facility];  Service: ENT;  Laterality: N/A;   MAXILLARY ANTROSTOMY Bilateral 04/17/2018   Procedure: MAXILLARY ANTROSTOMY;  Surgeon: [name], MD;  Location: [facility];  Service: ENT;  Laterality: Bilateral;   NASAL SEPTOPLASTY W/ TURBINOPLASTY Bilateral 04/17/2018   Procedure: NASAL SEPTOPLASTY WITH TURBINATE REDUCTION;  Surgeon: [name], MD;  Location: [facility];  Service: ENT;  Laterality: Bilateral;   TOTAL ABDOMINAL HYSTERECTOMY  2012   cervical dysplasia/ovaries remian   TRANSURETHRAL RESECTION OF BLADDER TUMOR N/A 04/17/2018   Procedure: NTRANSURETHRAL RESECTION OF BLADDER  TUMOR (TURBT) WITH GEMCITABINE;  Surgeon: [name], MD;  Location: [facility];  Service: Urology;  Laterality: N/A;    Family History  Problem Relation Age of Onset   Diabetes Maternal Grandmother    Hypertension Maternal Grandmother    Hyperlipidemia Maternal Grandmother    Hypothyroidism Maternal Grandmother    Heart disease Maternal Grandmother    Diabetes Mother    Hypertension Mother    Hypothyroidism Mother    Bipolar disorder Mother    Heart disease Mother    Kidney disease Mother    Thyroid disease Mother    Depression Mother    Anxiety disorder Mother    Drug abuse Mother    Obesity Mother    Alcohol abuse Father    Depression Daughter  Breast cancer Neg Hx     Social History   Social History Narrative   Not on file     Review of Systems General: Denies fevers, chills, weight loss CV: Denies chest pain, shortness of breath, palpitations   Physical Exam    03/18/2022   10:11 AM 03/04/2022   10:45 AM 01/15/2022    3:33 PM  Vitals with BMI  Height '5\' 6"'$  '5\' 6"'$  '5\' 6"'$   Weight 280 lbs 13 oz 281 lbs 3 oz 278 lbs  BMI 45.34 91.50 56.97  Systolic [phone]  Diastolic 99 90 89  Pulse 83 74 83    General:  No acute distress,  Alert and oriented, Non-Toxic, Normal speech and affect Breast: No easily palpable breast masses on physical exam, significant breast ptosis and macromastia. Her breasts are extremely large and fairly symmetric.  She has hyperpigmentation of the inframammary area on both sides.  The sternal to nipple distance on the right is 42 cm and the left is 42 cm.  The IMF distance is 17 cm on the right and 17 cm on the left.  Base width is 23 bilaterally. Assessment/Plan   The patient has bilateral symptomatic macromastia.  On her goals of relatively small breasts she will likely need treatment whole graft.  We discussed the risks of this and that she will have lack of sensation and depigmentation and she wishes to proceed.  She is a good  candidate for a breast reduction.  She is interested in pursuing surgical treatment.  She has tried supportive garments and fitted bras with no relief.  The details of breast reduction surgery were discussed.  I explained the procedure in detail along the with the expected scars.  The risks were discussed in detail and include bleeding, infection, damage to surrounding structures, need for additional procedures, nipple loss, change in nipple sensation, persistent pain, contour irregularities and asymmetries.  I explained that breast feeding is often not possible after breast reduction surgery.  We discussed the expected postoperative course with an overall recovery period of about 1 month.  She demonstrated full understanding of all risks.  We discussed her personal risk factors that include high BMI.  The patient is interested in pursuing surgical treatment.  The estimated excess breast tissue to be removed at the time of surgery = greater than 1000 grams on the left and greater than 1000 grams on the right. Lennice Sites 03/19/2022, 4:49 AM

## 2022-03-24 ENCOUNTER — Ambulatory Visit: Payer: Commercial Managed Care - HMO | Attending: Neurology | Admitting: Speech Pathology

## 2022-03-26 ENCOUNTER — Encounter: Payer: Self-pay | Admitting: Nurse Practitioner

## 2022-03-29 ENCOUNTER — Ambulatory Visit (INDEPENDENT_AMBULATORY_CARE_PROVIDER_SITE_OTHER): Payer: Commercial Managed Care - HMO | Admitting: Psychology

## 2022-03-29 DIAGNOSIS — F309 Manic episode, unspecified: Secondary | ICD-10-CM | POA: Diagnosis not present

## 2022-03-29 NOTE — Progress Notes (Signed)
[facility] Counselor/Therapist Progress Note  Patient ID: Lori Knight, MRN: [id number],    Date: 03/29/2022  Time Spent: 8:02 am - 8:50 am:  48 minutes  Treatment Type: Individual Therapy.  Reported Symptoms: Anxiety   Mental Status Exam: Appearance:  Neat and Well Groomed     Behavior: Appropriate  Motor: Normal  Speech/Language:  Clear and Coherent and Normal Rate  Affect: Congruent  Mood: normal  Thought process: normal  Thought content:   WNL  Sensory/Perceptual disturbances:   WNL  Orientation: oriented to person, place, time/date, and situation  Attention: Good  Concentration: Good  Memory: [place] of knowledge:  Good  Insight:   Good  Judgment:  Good  Impulse Control: Good   Subjective:   Lori Knight participated from home, via video and consented to treatment. Therapist participated from home office. We met online due to [place] pandemic.Lori Knight noted feeling tired and cranky. She noted feeling anxious due to phone issues and noted this being a reflection of "not being open to change". We reviewed her attempts to manage her distress. She noted feeling stressed due to her phone issue and her attempts to focus being an issue. She noted this not being a long-standing issue. She noted mundane tasks often results in irritability. She noted "If I don't understand and feel confident, I create anxiety". She noted worry related to her daughter who struggles with mental health concerns and inquired about additional formation to aid in treatment. We reviewed this during the session. We worked on identifying positives and engaging in positive self-talk. Therapist modeled challenging negative self-talk via evidence. Lori Knight was engaged and motivated during the session. Therapist provided supportive therapy.   Interventions:  Psychoeducational and CBT   Diagnosis:Bipolar I disorder, single manic episode ([place])  Plan: Patient is to use CBT, ACT, BA, mindfulness, and  coping skills to help manage decrease symptoms associated with their diagnosis. (Target Date 03/16/23)   Long-term goal:   Reduce overall level, frequency, and intensity of the feelings of depression and anxiety evidenced by negative self-talk, rumination, and poor self-esteem  from 6 to 7 days/week to 0 to 1 days/week per client report for at least 3 consecutive months. Engage in consistent self-care and mindfulness.   Short-term goal:  Verbally express understanding of the relationship between feelings of depression, bipolar, anxiety and their impact on thinking patterns and behaviors.  Verbalize an understanding of the role that distorted thinking plays in creating fears, excessive worry, and ruminations.   Lori Knight participated in the creation of the treatment plan)  [name], LCSW

## 2022-03-30 ENCOUNTER — Ambulatory Visit: Payer: Commercial Managed Care - HMO | Admitting: Speech Pathology

## 2022-04-02 ENCOUNTER — Encounter: Payer: Self-pay | Admitting: Plastic Surgery

## 2022-04-12 ENCOUNTER — Ambulatory Visit (INDEPENDENT_AMBULATORY_CARE_PROVIDER_SITE_OTHER): Payer: Commercial Managed Care - HMO | Admitting: Psychology

## 2022-04-12 DIAGNOSIS — F309 Manic episode, unspecified: Secondary | ICD-10-CM

## 2022-04-12 NOTE — Progress Notes (Signed)
[place] Counselor/Therapist Progress Note  Patient ID: Lori Knight, MRN: [id number],    Date: 04/12/2022  Time Spent: 8:03 am - 8:54 am:  51 minutes  Treatment Type: Individual Therapy.  Reported Symptoms: Anxiety   Mental Status Exam: Appearance:  Neat and Well Groomed     Behavior: Appropriate  Motor: Normal  Speech/Language:  Clear and Coherent and Normal Rate  Affect: Congruent  Mood: normal  Thought process: normal  Thought content:   WNL  Sensory/Perceptual disturbances:   WNL  Orientation: oriented to person, place, time/date, and situation  Attention: Good  Concentration: Good  Memory: [place] of knowledge:  Good  Insight:   Good  Judgment:  Good  Impulse Control: Good   Subjective:   Lori Knight participated from home, via video and consented to treatment. Therapist participated from office. We met online due to [place] pandemic. She noted stress in relation to her daughter and noted her "spiraling and fall a part". She noted her daughter's unmanaged depression although she participates in counseling and psychiatric treatment. We explored additional ways address concerns. She noted relief to experience being cleared for possible dementia/alzheimer's gene. She noted additional relief with her speech therapist providing her feedback regarding her memory and concentration. She noted continued struggle with her attention and concentration. She noted her provider discussed a possible ADHD diagnosis but Lori Knight discussed significant apprehension to pursue this due to her avoidance of stimulant medication. She noted her own rising anxiety regarding her daughter's mood and her daughter's struggle to manage her mood. She noted her daughter's resentment towards Lori Knight's attempts to encouraged her to make changes. Therapist praised [name] for her mindfulness and boundaries. Lori Knight was engaged and motivated during the session. Therapist provided supportive  therapy.   Interventions: Interpersonal  Diagnosis:Bipolar I disorder, single manic episode (Lori Knight)  Plan: Patient is to use CBT, ACT, BA, mindfulness, and coping skills to help manage decrease symptoms associated with their diagnosis. (Target Date 03/16/23)   Long-term goal:   Reduce overall level, frequency, and intensity of the feelings of depression and anxiety evidenced by negative self-talk, rumination, and poor self-esteem  from 6 to 7 days/week to 0 to 1 days/week per client report for at least 3 consecutive months. Engage in consistent self-care and mindfulness.   Short-term goal:  Verbally express understanding of the relationship between feelings of depression, bipolar, anxiety and their impact on thinking patterns and behaviors.  Verbalize an understanding of the role that distorted thinking plays in creating fears, excessive worry, and ruminations.   Lori Knight participated in the creation of the treatment plan)  Lori Irish, LCSW

## 2022-04-19 ENCOUNTER — Telehealth: Payer: Self-pay

## 2022-04-19 NOTE — Telephone Encounter (Signed)
Medical records/Pathologies from [facility] located in media. [email or web address]

## 2022-04-26 ENCOUNTER — Ambulatory Visit (INDEPENDENT_AMBULATORY_CARE_PROVIDER_SITE_OTHER): Payer: Commercial Managed Care - HMO | Admitting: Psychology

## 2022-04-26 DIAGNOSIS — F309 Manic episode, unspecified: Secondary | ICD-10-CM | POA: Diagnosis not present

## 2022-04-26 NOTE — Progress Notes (Signed)
[place] Counselor/Therapist Progress Note  Patient ID: Lori Knight, MRN: [id number],    Date: 04/26/2022  Time Spent: 8:02 am - 8:36 am:  34 minutes  Treatment Type: Individual Therapy.  Reported Symptoms: Anxiety   Mental Status Exam: Appearance:  Neat and Well Groomed     Behavior: Appropriate  Motor: Normal  Speech/Language:  Clear and Coherent and Normal Rate  Affect: Congruent  Mood: normal  Thought process: normal  Thought content:   WNL  Sensory/Perceptual disturbances:   WNL  Orientation: oriented to person, place, time/date, and situation  Attention: Good  Concentration: Good  Memory: [place] of knowledge:  Good  Insight:   Good  Judgment:  Good  Impulse Control: Good   Subjective:   Lori Knight participated from home, via video and consented to treatment. Therapist participated from office. We met online due to [place] pandemic. She noted difficulty with her sleep due to taking care of her daughter's ill dog. She noted being more irritable due to poor sleep.   She noted her worry regarding her lack of follow through regarding home tasks and per related tasks. She noted worry regarding her daughter's ability to manage the stressors of her new job and a poor mood. We explored Lori Knight's feelings and experience and discussed ways to provide support while maintaining her own mood. Therapist encouraged Lori Knight to identify and set boundaries and work on self-care, particularly sleep. Lori Knight was engaged and motivated during the session. Therapist praised Lori Knight for her effort and energy and provided supportive therapy. A follow-up was scheduled for continued treatment.   Interventions: Interpersonal  Diagnosis:Bipolar I disorder, single manic episode ([place])  Plan: Patient is to use CBT, ACT, BA, mindfulness, and coping skills to help manage decrease symptoms associated with their diagnosis. (Target Date 03/16/23)   Long-term goal:   Reduce overall level,  frequency, and intensity of the feelings of depression and anxiety evidenced by negative self-talk, rumination, and poor self-esteem  from 6 to 7 days/week to 0 to 1 days/week per client report for at least 3 consecutive months. Engage in consistent self-care and mindfulness.   Short-term goal:  Verbally express understanding of the relationship between feelings of depression, bipolar, anxiety and their impact on thinking patterns and behaviors.  Verbalize an understanding of the role that distorted thinking plays in creating fears, excessive worry, and ruminations.   Lori Knight participated in the creation of the treatment plan)  [name], LCSW

## 2022-04-27 ENCOUNTER — Telehealth: Payer: Self-pay | Admitting: *Deleted

## 2022-04-27 NOTE — Telephone Encounter (Signed)
Auth submitted to [employer]  This message was sent via [facility], a product from [employer] [email or web address]                    -------Fax Transmission Report-------  To:               Recipient at [phone] Subject:          [Secure} Auth Request Lori Knight - [id number] Result:           The transmission was successful. Explanation:      All Pages Ok Pages Sent:       17 Connect Time:     29 minutes, 39 seconds Transmit Time:    04/27/2022 15:04 Transfer Rate:    14400 Status Code:      0000 Retry Count:      2 Job Id:           [id number] Unique Id:        [id number] Fax Line:         37 Fax Server:       [email or web address]

## 2022-04-28 ENCOUNTER — Ambulatory Visit
Admission: RE | Admit: 2022-04-28 | Discharge: 2022-04-28 | Disposition: A | Discharge status: Home / Self Care | Payer: Commercial Managed Care - HMO | Source: Ambulatory Visit | Attending: Plastic Surgery | Admitting: Plastic Surgery

## 2022-04-28 DIAGNOSIS — Z1231 Encounter for screening mammogram for malignant neoplasm of breast: Secondary | ICD-10-CM

## 2022-04-28 DIAGNOSIS — N62 Hypertrophy of breast: Secondary | ICD-10-CM

## 2022-05-03 ENCOUNTER — Encounter: Payer: Self-pay | Admitting: Plastic Surgery

## 2022-05-03 ENCOUNTER — Ambulatory Visit: Payer: Commercial Managed Care - HMO | Admitting: Plastic Surgery

## 2022-05-03 VITALS — BP 167/94 | HR 85 | Ht 66.0 in | Wt 276.8 lb

## 2022-05-03 DIAGNOSIS — M546 Pain in thoracic spine: Secondary | ICD-10-CM

## 2022-05-03 DIAGNOSIS — M542 Cervicalgia: Secondary | ICD-10-CM

## 2022-05-03 DIAGNOSIS — N62 Hypertrophy of breast: Secondary | ICD-10-CM | POA: Diagnosis not present

## 2022-05-03 DIAGNOSIS — Z6841 Body Mass Index (BMI) 40.0 and over, adult: Secondary | ICD-10-CM | POA: Diagnosis not present

## 2022-05-10 ENCOUNTER — Encounter: Payer: Commercial Managed Care - HMO | Admitting: Psychology

## 2022-05-10 NOTE — Progress Notes (Signed)
° ° ° ° ° ° ° ° ° ° ° ° ° ° °  Lori Timko, LCSW °

## 2022-05-24 ENCOUNTER — Ambulatory Visit (INDEPENDENT_AMBULATORY_CARE_PROVIDER_SITE_OTHER): Payer: Commercial Managed Care - HMO | Admitting: Psychology

## 2022-05-24 DIAGNOSIS — F309 Manic episode, unspecified: Secondary | ICD-10-CM
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
## 2022-05-27 NOTE — Progress Notes (Signed)
Referring Provider [name], NP [place],  [place] [address]   CC:  Chief Complaint  Patient presents with   Advice Only      Lori Knight is an 51 y.o. female.  HPI: Lori Knight is a 51 year old female who was previously seen in the clinic for a bilateral breast reduction.  The surgeon that saw her discussed with her at the procedure and removal breast tissue.  He estimated 1000 g per breast to be removed and per the patient he discussed with her a postoperative cup size for her bra.  She still relates a history of pain in her upper back and neck.  Allergies  Allergen Reactions   Hctz [Hydrochlorothiazide] Cough   Lisinopril Cough   Latex Rash    Condoms only   Prednisone Anxiety    Paranoia    Outpatient Encounter Medications as of 05/03/2022  Medication Sig   acetaminophen (TYLENOL) 500 MG tablet Take 500 mg by mouth every 6 (six) hours as needed.   amLODipine (NORVASC) 5 MG tablet Take 1 tablet (5 mg total) by mouth daily.   Ascorbic Acid (VITAMIN C) 1000 MG tablet Take 1,000 mg by mouth daily.   atorvastatin (LIPITOR) 20 MG tablet Take 1 tablet (20 mg total) by mouth daily.   Cholecalciferol 5000 units capsule Take 1 capsule (5,000 Units total) by mouth daily.   CONTOUR NEXT TEST test strip USE TO CHECK BS UP TO 3 TIMES DAILY FOR DIABETES DX: E11.9   Dulaglutide (TRULICITY) 4.5 WF/0.9NA SOPN Inject 4.5 mg as directed once a week.   EPINEPHrine 0.3 mg/0.3 mL IJ SOAJ injection Inject 0.3 mg into the muscle as needed for anaphylaxis.   hydrocortisone (ANUSOL-HC) 25 MG suppository Place 1 suppository (25 mg total) rectally 2 (two) times daily.   ipratropium (ATROVENT) 0.06 % nasal spray Place 2 sprays into both nostrils 4 (four) times daily.   lamoTRIgine (LAMICTAL) 150 MG tablet Take 150 mg by mouth 2 (two) times daily.   levothyroxine (SYNTHROID) 150 MCG tablet Take 1 tablet (150 mcg total) by mouth daily.   losartan (COZAAR) 50 MG tablet Take 2 tablets  (100 mg total) by mouth daily.   metFORMIN (GLUCOPHAGE) 500 MG tablet Take 1 tablet (500 mg total) by mouth 2 (two) times daily.   mometasone (ELOCON) 0.1 % cream SMARTSIG:1 Application Topical   nystatin (MYCOSTATIN/NYSTOP) powder Apply 1 application. topically 3 (three) times daily.   Omega-3 Fatty Acids (FISH OIL) 1200 MG CAPS Take 1,200 mg by mouth daily.    omeprazole (PRILOSEC) 40 MG capsule TAKE 1 CAPSULE BY MOUTH EVERY DAY   No facility-administered encounter medications on file as of 05/03/2022.     Past Medical History:  Diagnosis Date   Adult hypothyroidism 06/21/2012   Anxiety    Back pain    Benign paroxysmal positional nystagmus    Bilateral polycystic ovarian syndrome 06/21/2012   Biliary calculus with cholecystitis 08/14/2012   Bipolar 1 disorder (HCC)    BP (high blood pressure) 02/14/2014   Bronchitis    Constipation    COVID-19 07/2020   Depression    Diabetes mellitus ([place]) 08/14/2012   Diabetes mellitus without complication ([place])    pre-diabetic   Dysrhythmia    wore heart monitor 2016. Corrected by changing Levothyroxine dose.   Edema of both lower extremities    Gallbladder problem    GERD (gastroesophageal reflux disease)    Heart murmur    followed by PCP-AS A  CHILD-ASYMPTOMATIC   Hyperlipidemia    Hyperprolactinemia ([place]) 06/21/2012   Hypertension    Hypothyroid    Hypoxia 04/17/2018   Joint pain    Kidney problem    Meningioma ([place])    Motion sickness    carnival rides   Neuropathy    PONV (postoperative nausea and vomiting)    Low BP after sinus surgery. WAKES UP CRYING   Shortness of breath dyspnea    stairs. related to wt.   Sleep apnea    has CPAP. has not used since 2011   Swallowing difficulty    Vertigo 2016   none recently   Vitamin D deficiency     Past Surgical History:  Procedure Laterality Date   CHOLECYSTECTOMY     COLONOSCOPY WITH PROPOFOL N/A 06/19/2015   Procedure: COLONOSCOPY WITH PROPOFOL;  Surgeon: [name],  MD;  Location: [facility];  Service: Endoscopy;  Laterality: N/A;  Diabetic - oral meds    DENTAL SURGERY     ESOPHAGOGASTRODUODENOSCOPY (EGD) WITH PROPOFOL N/A 01/16/2021   Procedure: ESOPHAGOGASTRODUODENOSCOPY (EGD) WITH PROPOFOL;  Surgeon: [name], MD;  Location: [facility];  Service: Endoscopy;  Laterality: N/A;  Diabetic - oral meds   ETHMOIDECTOMY Bilateral 04/17/2018   Procedure: ETHMOIDECTOMY;  Surgeon: [name], MD;  Location: [facility];  Service: ENT;  Laterality: Bilateral;   GALLBLADDER SURGERY     IMAGE GUIDED SINUS SURGERY N/A 04/17/2018   Procedure: IMAGE GUIDED SINUS SURGERY;  Surgeon: [name], MD;  Location: [facility];  Service: ENT;  Laterality: N/A;   MAXILLARY ANTROSTOMY Bilateral 04/17/2018   Procedure: MAXILLARY ANTROSTOMY;  Surgeon: [name], MD;  Location: [facility];  Service: ENT;  Laterality: Bilateral;   NASAL SEPTOPLASTY W/ TURBINOPLASTY Bilateral 04/17/2018   Procedure: NASAL SEPTOPLASTY WITH TURBINATE REDUCTION;  Surgeon: [name], MD;  Location: [facility];  Service: ENT;  Laterality: Bilateral;   TOTAL ABDOMINAL HYSTERECTOMY  2012   cervical dysplasia/ovaries remian   TRANSURETHRAL RESECTION OF BLADDER TUMOR N/A 04/17/2018   Procedure: NTRANSURETHRAL RESECTION OF BLADDER TUMOR (TURBT) WITH GEMCITABINE;  Surgeon: [name], MD;  Location: [facility];  Service: Urology;  Laterality: N/A;    Family History  Problem Relation Age of Onset   Diabetes Maternal Grandmother    Hypertension Maternal Grandmother    Hyperlipidemia Maternal Grandmother    Hypothyroidism Maternal Grandmother    Heart disease Maternal Grandmother    Diabetes Mother    Hypertension Mother    Hypothyroidism Mother    Bipolar disorder Mother    Heart disease Mother    Kidney disease Mother    Thyroid disease Mother    Depression Mother    Anxiety disorder Mother    Drug abuse Mother    Obesity Mother    Alcohol abuse Father    Depression  Daughter    Breast cancer Neg Hx     Social History   Social History Narrative   Not on file     Review of Systems General: Denies fevers, chills, weight loss CV: Denies chest pain, shortness of breath, palpitations Breast: Patient believes that her breast contribute to her upper back and neck pain.  Physical Exam    05/03/2022   11:14 AM 03/18/2022   10:11 AM 03/04/2022   10:45 AM  Vitals with BMI  Height '5\' 6"'$  '5\' 6"'$  '5\' 6"'$   Weight 276 lbs 13 oz 280 lbs 13 oz 281 lbs 3 oz  BMI 44.7 88.11 03.15  Systolic 945 859 292  Diastolic 94 99 90  Pulse 85 83 74    General:  No acute distress,  Alert and oriented, Non-Toxic, Normal speech and affect Breast: Patient has extremely long heavy ptotic breasts.  As noted before her sternal notch to nipple distance is greater than 40 cm.  She has no nipple abnormalities or discharge.  There are no dominant masses in her breast.  Mammogram:  Mammogram performed in October 2023 was BI-RADS 1  Assessment/Plan Symptomatic macromastia: Patient has a BMI of 45.  She also has extremely long sternal notch to nipple distance.  When discussing the procedure with her I told her that there is a possibility that she will require a free nipple graft and I also was unable to promise her any particular cup size with the reduction.  She seemed quite upset by this.  She felt that I was not giving her the same information that her prior surgeon did.  We briefly discussed the risks and complications of the procedure including nipple loss due to ischemia, however I am not sure that she and I were communicating any longer.  Tried to explain to her that cup sizes can vary depending on manufacture and it is almost impossible to predict what cup size a patient has been left with at the end of a breast reduction.  At the conclusion of our encounter I believe she was still unhappy with what I had told her.  I suspect that she will not be happy with any procedure that I performed on  her.  I believe she is scheduled to see another surgeon in our department who hopefully can address her concerns in a way more to her liking.  Camillia Herter 05/27/2022, 4:34 PM

## 2022-06-07 ENCOUNTER — Ambulatory Visit: Payer: Commercial Managed Care - HMO | Admitting: Psychology

## 2022-06-09 ENCOUNTER — Ambulatory Visit: Payer: Commercial Managed Care - HMO | Admitting: Nurse Practitioner

## 2022-06-13 NOTE — Patient Instructions (Signed)
Diabetes Mellitus Basics  Diabetes mellitus, or diabetes, is a long-term (chronic) disease. It occurs when the body does not properly use sugar (glucose) that is released from food after you eat. Diabetes mellitus may be caused by one or both of these problems: Your pancreas does not make enough of a hormone called insulin. Your body does not react in a normal way to the insulin that it makes. Insulin lets glucose enter cells in your body. This gives you energy. If you have diabetes, glucose cannot get into cells. This causes high blood glucose (hyperglycemia). How to treat and manage diabetes You may need to take insulin or other diabetes medicines daily to keep your glucose in balance. If you are prescribed insulin, you will learn how to give yourself insulin by injection. You may need to adjust the amount of insulin you take based on the foods that you eat. You will need to check your blood glucose levels using a glucose monitor as told by your health care provider. The readings can help determine if you have low or high blood glucose. Generally, you should have these blood glucose levels: Before meals (preprandial): 80-130 mg/dL (4.4-7.2 mmol/L). After meals (postprandial): below 180 mg/dL (10 mmol/L). Hemoglobin A1c (HbA1c) level: less than 7%. Your health care provider will set treatment goals for you. Keep all follow-up visits. This is important. Follow these instructions at home: Diabetes medicines Take your diabetes medicines every day as told by your health care provider. List your diabetes medicines here: Name of medicine: ______________________________ Amount (dose): _______________ Time (a.m./p.m.): _______________ Notes: ___________________________________ Name of medicine: ______________________________ Amount (dose): _______________ Time (a.m./p.m.): _______________ Notes: ___________________________________ Name of medicine: ______________________________ Amount (dose):  _______________ Time (a.m./p.m.): _______________ Notes: ___________________________________ Insulin If you use insulin, list the types of insulin you use here: Insulin type: ______________________________ Amount (dose): _______________ Time (a.m./p.m.): _______________Notes: ___________________________________ Insulin type: ______________________________ Amount (dose): _______________ Time (a.m./p.m.): _______________ Notes: ___________________________________ Insulin type: ______________________________ Amount (dose): _______________ Time (a.m./p.m.): _______________ Notes: ___________________________________ Insulin type: ______________________________ Amount (dose): _______________ Time (a.m./p.m.): _______________ Notes: ___________________________________ Insulin type: ______________________________ Amount (dose): _______________ Time (a.m./p.m.): _______________ Notes: ___________________________________ Managing blood glucose  Check your blood glucose levels using a glucose monitor as told by your health care provider. Write down the times that you check your glucose levels here: Time: _______________ Notes: ___________________________________ Time: _______________ Notes: ___________________________________ Time: _______________ Notes: ___________________________________ Time: _______________ Notes: ___________________________________ Time: _______________ Notes: ___________________________________ Time: _______________ Notes: ___________________________________  Low blood glucose Low blood glucose (hypoglycemia) is when glucose is at or below 70 mg/dL (3.9 mmol/L). Symptoms may include: Feeling: Hungry. Sweaty and clammy. Irritable or easily upset. Dizzy. Sleepy. Having: A fast heartbeat. A headache. A change in your vision. Numbness around the mouth, lips, or tongue. Having trouble with: Moving (coordination). Sleeping. Treating low blood glucose To treat low blood  glucose, eat or drink something containing sugar right away. If you can think clearly and swallow safely, follow the 15:15 rule: Take 15 grams of a fast-acting carb (carbohydrate), as told by your health care provider. Some fast-acting carbs are: Glucose tablets: take 3-4 tablets. Hard candy: eat 3-5 pieces. Fruit juice: drink 4 oz (120 mL). Regular (not diet) soda: drink 4-6 oz (120-180 mL). Honey or sugar: eat 1 Tbsp (15 mL). Check your blood glucose levels 15 minutes after you take the carb. If your glucose is still at or below 70 mg/dL (3.9 mmol/L), take 15 grams of a carb again. If your glucose does not go above 70 mg/dL (3.9 mmol/L) after   3 tries, get help right away. After your glucose goes back to normal, eat a meal or a snack within 1 hour. Treating very low blood glucose If your glucose is at or below 54 mg/dL (3 mmol/L), you have very low blood glucose (severe hypoglycemia). This is an emergency. Do not wait to see if the symptoms will go away. Get medical help right away. Call your local emergency services (911 in the [place] Do not drive yourself to the hospital. Questions to ask your health care provider Should I talk with a diabetes educator? What equipment will I need to care for myself at home? What diabetes medicines do I need? When should I take them? How often do I need to check my blood glucose levels? What number can I call if I have questions? When is my follow-up visit? Where can I find a support group for people with diabetes? Where to find more information American Diabetes Association: www.diabetes.org Association of Diabetes Care and Education Specialists: www.diabeteseducator.org Contact a health care provider if: Your blood glucose is at or above 240 mg/dL (13.3 mmol/L) for 2 days in a row. You have been sick or have had a fever for 2 days or more, and you are not getting better. You have any of these problems for more than 6 hours: You cannot eat or  drink. You feel nauseous. You vomit. You have diarrhea. Get help right away if: Your blood glucose is lower than 54 mg/dL (3 mmol/L). You get confused. You have trouble thinking clearly. You have trouble breathing. These symptoms may represent a serious problem that is an emergency. Do not wait to see if the symptoms will go away. Get medical help right away. Call your local emergency services (911 in the [place] Do not drive yourself to the hospital. Summary Diabetes mellitus is a chronic disease that occurs when the body does not properly use sugar (glucose) that is released from food after you eat. Take insulin and diabetes medicines as told. Check your blood glucose every day, as often as told. Keep all follow-up visits. This is important. This information is not intended to replace advice given to you by your health care provider. Make sure you discuss any questions you have with your health care provider. Document Revised: 10/30/2019 Document Reviewed: 10/30/2019 Elsevier Patient Education  2023 Elsevier Inc.  

## 2022-06-16 ENCOUNTER — Encounter: Payer: Self-pay | Admitting: Nurse Practitioner

## 2022-06-16 ENCOUNTER — Ambulatory Visit (INDEPENDENT_AMBULATORY_CARE_PROVIDER_SITE_OTHER): Payer: Commercial Managed Care - HMO | Admitting: Nurse Practitioner

## 2022-06-16 VITALS — BP 138/87 | HR 73 | Temp 97.9°F | Ht 65.98 in | Wt 279.3 lb

## 2022-06-16 DIAGNOSIS — E1169 Type 2 diabetes mellitus with other specified complication: Secondary | ICD-10-CM | POA: Diagnosis not present

## 2022-06-16 DIAGNOSIS — D329 Benign neoplasm of meninges, unspecified: Secondary | ICD-10-CM | POA: Diagnosis not present

## 2022-06-16 DIAGNOSIS — E1159 Type 2 diabetes mellitus with other circulatory complications: Secondary | ICD-10-CM | POA: Diagnosis not present

## 2022-06-16 DIAGNOSIS — I152 Hypertension secondary to endocrine disorders: Secondary | ICD-10-CM

## 2022-06-16 DIAGNOSIS — E1129 Type 2 diabetes mellitus with other diabetic kidney complication: Secondary | ICD-10-CM

## 2022-06-16 DIAGNOSIS — E785 Hyperlipidemia, unspecified: Secondary | ICD-10-CM

## 2022-06-16 DIAGNOSIS — G3184 Mild cognitive impairment, so stated: Secondary | ICD-10-CM

## 2022-06-16 DIAGNOSIS — E063 Autoimmune thyroiditis: Secondary | ICD-10-CM

## 2022-06-16 DIAGNOSIS — G4733 Obstructive sleep apnea (adult) (pediatric): Secondary | ICD-10-CM

## 2022-06-16 DIAGNOSIS — I700 Atherosclerosis of aorta: Secondary | ICD-10-CM

## 2022-06-16 DIAGNOSIS — R809 Proteinuria, unspecified: Secondary | ICD-10-CM

## 2022-06-16 DIAGNOSIS — N62 Hypertrophy of breast: Secondary | ICD-10-CM

## 2022-06-16 DIAGNOSIS — F309 Manic episode, unspecified: Secondary | ICD-10-CM

## 2022-06-16 LAB — BAYER DCA HB A1C WAIVED: HB A1C (BAYER DCA - WAIVED): 6.9 % — ABNORMAL HIGH (ref 4.8–5.6)

## 2022-06-16 MED ORDER — TIRZEPATIDE 7.5 MG/0.5ML ~~LOC~~ SOAJ: 7.5000 mg | SUBCUTANEOUS | 0 refills | Status: DC

## 2022-06-16 MED ORDER — TIRZEPATIDE 5 MG/0.5ML ~~LOC~~ SOAJ: 5.0000 mg | SUBCUTANEOUS | 0 refills | Status: DC

## 2022-06-16 NOTE — Assessment & Plan Note (Signed)
BMI 45.10 with T2DM, HTN/HLD.  Recommended eating smaller high protein, low fat meals more frequently and exercising 30 mins a day 5 times a week with a goal of 10-15lb weight loss in the next 3 months. Patient voiced their understanding and motivation to adhere to these recommendations.

## 2022-06-16 NOTE — Assessment & Plan Note (Signed)
Followed by neurology, continue collaboration with neurology team and recent notes reviewed.

## 2022-06-16 NOTE — Assessment & Plan Note (Signed)
Chronic, ongoing.  Continue on statin which is offering benefit.  Lipid panel today.  Return in 3 months.

## 2022-06-16 NOTE — Assessment & Plan Note (Signed)
Chronic, ongoing.  Continue Levothyroxine 150 MCG daily.  Educated her on this.  Thyroid labs up to date.

## 2022-06-16 NOTE — Assessment & Plan Note (Signed)
Continue collaboration with plastic surgery, recent notes reviewed.

## 2022-06-16 NOTE — Assessment & Plan Note (Signed)
Chronic.  Noted on imaging 03/17/2018.  Recommend continued use of statin daily and consider addition of ASA in future for prevention.

## 2022-06-16 NOTE — Assessment & Plan Note (Signed)
Chronic, ongoing with A1c 6.9% today, slight trend up, and urine ALB 10 (February 2023).  Continue Losartan for kidney protection.  Will continue Metformin, but attempt to switch to [facility] which may offer better BS and weight loss -- send PA for this, had tried higher dose Metformin and other GLP1 without benefit in past.  Educated on BS goals in morning and 2 hours after meals.  Return in 3 months.

## 2022-06-16 NOTE — Progress Notes (Signed)
BP 138/87   Pulse 73   Temp 97.9 F (36.6 C) (Oral)   Ht 5' 5.98" (1.676 m)   Wt 279 lb 4.8 oz (126.7 kg)   SpO2 99%   BMI 45.10 kg/m    Subjective:    Patient ID: Lori Knight, female    DOB: 03/17/1971, 51 y.o.   MRN: [id number]  HPI: Lori Knight is a 51 y.o. female  Chief Complaint  Patient presents with   Diabetes   Anxiety   Depression   Hypertension   Hyperlipidemia   Hypothyroidism   DIABETES A1c in August was 6.2%. Continues on Metformin 500 MG in morning and 500 MG at night and Trulicity 4.5 MG weekly.  She endorses being uncomfortable with weight gain -- shoulders and upper back.  Bra size is a 48 H -- does endorses discomfort with this.  Saw plastic surgery last 05/03/22 to discuss breast reduction. Hypoglycemic episodes:no Polydipsia/polyuria: occasional Visual disturbance: no Chest pain: no Paresthesias: no Glucose Monitoring: no             Accucheck frequency: not checking             Fasting glucose: not checking             Post prandial:              Evening:             Before meals: Taking Insulin?: no             Long acting insulin:             Short acting insulin: Blood Pressure Monitoring: not checking Retinal Examination: Up to Date -- [facility] in [place] Exam: Up to Date Pneumovax: Up to Date Influenza: Up to Date Aspirin: no   HYPERTENSION / HYPERLIPIDEMIA Continues on Losartan 100 MG, Amlodipine 5 MG and Atorvastatin 20 MG QHS. Echo in 2019 noted EF 50-55%.  CPAP being used consistently at home.   Follows with neurosurgery, Dr. Lacinda Axon, for meningioma.  This remains stable.  She returns every 24 month follow up visits.  Last saw Dr. Manuella Ghazi, with neurology on 06/10/22 for memory changes --- has completed speech therapy. Duration of hypertension: chronic BP monitoring frequency: not checking BP range:  BP medication side effects: no Duration of hyperlipidemia: chronic Aspirin: no Recent stressors: no Recurrent  headaches: no Visual changes: no Palpitations: no Dyspnea: no Chest pain: no Lower extremity edema: no Dizzy/lightheaded: no  The 10-year ASCVD risk score (Arnett DK, et al., 2019) is: 5.1%   Values used to calculate the score:     Age: 43 years     Sex: Female     Is Non-Hispanic African American: No     Diabetic: Yes     Tobacco smoker: No     Systolic Blood Pressure: 938 mmHg     Is BP treated: Yes     HDL Cholesterol: 39 mg/dL     Total Cholesterol: 163 mg/dL   HYPOTHYROIDISM Continues Levothyroxine 150 MCG daily. Thyroid control status:stable Satisfied with current treatment? yes Medication side effects: no Medication compliance: good compliance Etiology of hypothyroidism:  Recent dose adjustment: yes Fatigue: no Cold intolerance: no Heat intolerance: no Weight gain: no Weight loss: no Constipation: occasional Diarrhea/loose stools: no Palpitations: no Lower extremity edema: no Anxiety/depressed mood: no   BIPOLAR DISORDER Followed by psychiatry and psychology -- currently taking Lamictal BID, does endorse some situational anxiety recently. Mood status: stable  Satisfied with current treatment?: yes Symptom severity: moderate  Duration of current treatment : chronic Side effects: no Medication compliance: good compliance Psychotherapy/counseling: at present -- with Alene Mires Depressed mood: occasional Anxious mood: yes Anhedonia: no Significant weight loss or gain: no Insomnia: no Fatigue: yes Feelings of worthlessness or guilt: no Impaired concentration/indecisiveness: yes Suicidal ideations: no Hopelessness: no Crying spells: yes    06/16/2022    9:53 AM 03/04/2022   11:04 AM 01/15/2022    3:37 PM 12/02/2021    8:35 AM 11/17/2021    2:28 PM  Depression screen PHQ 2/9  Decreased Interest 2 1 0 0 1  Down, Depressed, Hopeless 2 1 0 1 1  PHQ - 2 Score 4 2 0 1 2  Altered sleeping 0 0 1 0 1  Tired, decreased energy '1 3 2 3 1  '$ Change in appetite '2 3 3 3 1   '$ Feeling bad or failure about yourself  0 3 1 0 1  Trouble concentrating '3 3 2 3 2  '$ Moving slowly or fidgety/restless '1 1 1 '$ 0 0  Suicidal thoughts 0 0 0 0 0  PHQ-9 Score '11 15 10 10 8  '$ Difficult doing work/chores Somewhat difficult Somewhat difficult Not difficult at all         06/16/2022    9:53 AM 03/04/2022   11:04 AM 01/15/2022    3:37 PM 12/02/2021    8:35 AM  GAD 7 : Generalized Anxiety Score  Nervous, Anxious, on Edge '2 2 1 1  '$ Control/stop worrying '2 2 1 1  '$ Worry too much - different things '2 2 1 1  '$ Trouble relaxing '2 2 1 1  '$ Restless '2 2 1 1  '$ Easily annoyed or irritable '2 3 1 1  '$ Afraid - awful might happen 2 2 0 0  Total GAD 7 Score '14 15 6 6  '$ Anxiety Difficulty Somewhat difficult Somewhat difficult Not difficult at all    Relevant past medical, surgical, family and social history reviewed and updated as indicated. Interim medical history since our last visit reviewed. Allergies and medications reviewed and updated.  Review of Systems  Constitutional:  Positive for chills and fatigue. Negative for activity change, appetite change, diaphoresis and fever.  Respiratory:  Negative for cough, chest tightness and shortness of breath.   Cardiovascular:  Negative for chest pain, palpitations and leg swelling.  Gastrointestinal: Negative.   Endocrine: Negative for cold intolerance, heat intolerance, polydipsia, polyphagia and polyuria.  Musculoskeletal:  Positive for arthralgias.  Neurological:  Positive for headaches. Negative for dizziness, tremors, syncope and weakness.  Psychiatric/Behavioral: Negative.     Per HPI unless specifically indicated above     Objective:    BP 138/87   Pulse 73   Temp 97.9 F (36.6 C) (Oral)   Ht 5' 5.98" (1.676 m)   Wt 279 lb 4.8 oz (126.7 kg)   SpO2 99%   BMI 45.10 kg/m   Wt Readings from Last 3 Encounters:  06/16/22 279 lb 4.8 oz (126.7 kg)  05/03/22 276 lb 12.8 oz (125.6 kg)  03/18/22 280 lb 12.8 oz (127.4 kg)    Physical  Exam Vitals and nursing note reviewed.  Constitutional:      General: She is awake. She is not in acute distress.    Appearance: She is well-developed and well-groomed. She is morbidly obese. She is not ill-appearing.  HENT:     Head: Normocephalic.     Right Ear: Hearing, tympanic membrane, ear canal and external ear normal.  Left Ear: Hearing, tympanic membrane, ear canal and external ear normal.     Nose: Nose normal.     Right Sinus: No maxillary sinus tenderness or frontal sinus tenderness.     Left Sinus: No maxillary sinus tenderness or frontal sinus tenderness.     Mouth/Throat:     Mouth: Mucous membranes are moist.     Pharynx: No pharyngeal swelling, oropharyngeal exudate or posterior oropharyngeal erythema.  Eyes:     General: Lids are normal.        Right eye: No discharge.        Left eye: No discharge.     Conjunctiva/sclera: Conjunctivae normal.     Pupils: Pupils are equal, round, and reactive to light.  Neck:     Thyroid: No thyromegaly.     Vascular: No carotid bruit.  Cardiovascular:     Rate and Rhythm: Normal rate and regular rhythm.     Heart sounds: Normal heart sounds. No murmur heard.    No gallop.  Pulmonary:     Effort: Pulmonary effort is normal. No accessory muscle usage or respiratory distress.     Breath sounds: Normal breath sounds.  Abdominal:     General: Bowel sounds are normal. There is no distension.     Palpations: Abdomen is soft. There is no hepatomegaly.     Tenderness: There is no abdominal tenderness. There is no right CVA tenderness or left CVA tenderness.     Hernia: No hernia is present.  Musculoskeletal:     Cervical back: Normal range of motion and neck supple.     Right lower leg: No edema.     Left lower leg: No edema.  Lymphadenopathy:     Cervical: No cervical adenopathy.  Skin:    General: Skin is warm and dry.  Neurological:     Mental Status: She is alert and oriented to person, place, and time.  Psychiatric:         Attention and Perception: Attention normal.        Mood and Affect: Mood normal.        Speech: Speech normal.        Behavior: Behavior normal. Behavior is cooperative.        Thought Content: Thought content normal.     Results for orders placed or performed in visit on 06/16/22  Bayer DCA Hb A1c Waived  Result Value Ref Range   HB A1C (BAYER DCA - WAIVED) 6.9 (H) 4.8 - 5.6 %      Assessment & Plan:   Problem List Items Addressed This Visit       Cardiovascular and Mediastinum   Aortic atherosclerosis (HCC)    Chronic.  Noted on imaging 03/17/2018.  Recommend continued use of statin daily and consider addition of ASA in future for prevention.      Relevant Orders   Comprehensive metabolic panel   Lipid Panel w/o Chol/HDL Ratio   Hypertension associated with diabetes (HCC)    Chronic, ongoing with BP slightly above goal today due to stressors.  Continue current medication regimen and adjust as needed.  Focus on DASH diet and regular exercise at home.  Recommend she check BP at few days a week at home and document for provider.  Losartan for kidney protection, urine ALB 10 on 08/18/21.  LABS: CMP.  Return in 3 months.      Relevant Medications   tirzepatide (MOUNJARO) 5 MG/0.5ML Pen   tirzepatide (MOUNJARO) 7.5 MG/0.5ML Pen  Other Relevant Orders   Bayer DCA Hb A1c Waived (Completed)   Comprehensive metabolic panel     Respiratory   OSA on CPAP    Chronic, stable with consistent use of CPAP.  Continue to use 100%.        Endocrine   Hashimoto's thyroiditis    Chronic, ongoing.  Continue Levothyroxine 150 MCG daily.  Educated her on this.  Thyroid labs up to date.      Hyperlipidemia associated with type 2 diabetes mellitus (HCC)    Chronic, ongoing.  Continue on statin which is offering benefit.  Lipid panel today.  Return in 3 months.      Relevant Medications   tirzepatide (MOUNJARO) 5 MG/0.5ML Pen   tirzepatide (MOUNJARO) 7.5 MG/0.5ML Pen   Other Relevant  Orders   Bayer DCA Hb A1c Waived (Completed)   Comprehensive metabolic panel   Lipid Panel w/o Chol/HDL Ratio   Type 2 diabetes mellitus with morbid obesity (HCC)    Chronic, ongoing with A1c 6.9% today, slight trend up, and urine ALB 10 (February 2023).  Continue Losartan for kidney protection.  Will continue Metformin, but attempt to switch to [facility] which may offer better BS and weight loss -- send PA for this, had tried higher dose Metformin and other GLP1 without benefit in past.  Educated on BS goals in morning and 2 hours after meals.  Return in 3 months.        Relevant Medications   tirzepatide (MOUNJARO) 5 MG/0.5ML Pen   tirzepatide (MOUNJARO) 7.5 MG/0.5ML Pen   Other Relevant Orders   Bayer DCA Hb A1c Waived (Completed)   Comprehensive metabolic panel   Type 2 diabetes mellitus with proteinuria (HCC) - Primary    Chronic, ongoing with A1c 6.9% today, slight trend up, and urine ALB 10 (February 2023).  Continue Losartan for kidney protection.  Will continue Metformin, but attempt to switch to [facility] which may offer better BS and weight loss -- send PA for this, had tried higher dose Metformin and other GLP1 without benefit in past.  Educated on BS goals in morning and 2 hours after meals.  Return in 3 months.        Relevant Medications   tirzepatide (MOUNJARO) 5 MG/0.5ML Pen   tirzepatide (MOUNJARO) 7.5 MG/0.5ML Pen   Other Relevant Orders   Bayer DCA Hb A1c Waived (Completed)   Comprehensive metabolic panel     Nervous and Auditory   Meningioma (HCC)    Followed by neurology, continue collaboration with neurology team and recent notes reviewed.      Relevant Orders   Comprehensive metabolic panel     Other   Bipolar I disorder, single manic episode (HCC)    Chronic, stable.  Denies SI/HI.  Followed by psychiatry, will continue this collaboration and current medication regimen as prescribed by them.  Return in 3 months.      Macromastia    Continue collaboration  with plastic surgery, recent notes reviewed.      Mild cognitive impairment    Improving  Continue to collaborate with neurology and speech therapy.  Recent notes reviewed.      Obesity, Class III, BMI 40-49.9 (morbid obesity) (HCC)    BMI 45.10 with T2DM, HTN/HLD.  Recommended eating smaller high protein, low fat meals more frequently and exercising 30 mins a day 5 times a week with a goal of 10-15lb weight loss in the next 3 months. Patient voiced their understanding and motivation to adhere to these  recommendations.        Relevant Medications   tirzepatide [facility]) 5 MG/0.5ML Pen   tirzepatide (MOUNJARO) 7.5 MG/0.5ML Pen     Follow up plan: Return in about 5 weeks (around 07/21/2022) for T2DM -- added Mounjaro.

## 2022-06-16 NOTE — Assessment & Plan Note (Signed)
Chronic, ongoing with BP slightly above goal today due to stressors.  Continue current medication regimen and adjust as needed.  Focus on DASH diet and regular exercise at home.  Recommend she check BP at few days a week at home and document for provider.  Losartan for kidney protection, urine ALB 10 on 08/18/21.  LABS: CMP.  Return in 3 months.

## 2022-06-16 NOTE — Assessment & Plan Note (Signed)
Chronic, stable.  Denies SI/HI.  Followed by psychiatry, will continue this collaboration and current medication regimen as prescribed by them.  Return in 3 months.

## 2022-06-16 NOTE — Assessment & Plan Note (Signed)
Improving  Continue to collaborate with neurology and speech therapy.  Recent notes reviewed.

## 2022-06-16 NOTE — Assessment & Plan Note (Addendum)
Chronic, ongoing with A1c 6.9% today, slight trend up, and urine ALB 10 (February 2023).  Continue Losartan for kidney protection.  Will continue Metformin, but attempt to switch to [facility] which may offer better BS and weight loss -- send PA for this, had tried higher dose Metformin and other GLP1 without benefit in past.  Educated on BS goals in morning and 2 hours after meals.  Return in 3 months.

## 2022-06-16 NOTE — Assessment & Plan Note (Signed)
Chronic, stable with consistent use of CPAP.  Continue to use 100%.

## 2022-06-17 ENCOUNTER — Encounter: Payer: Self-pay | Admitting: Nurse Practitioner

## 2022-06-17 LAB — COMPREHENSIVE METABOLIC PANEL
ALT: 23 IU/L (ref 0–32)
AST: 14 IU/L (ref 0–40)
Albumin/Globulin Ratio: 2 (ref 1.2–2.2)
Albumin: 4.6 g/dL (ref 3.8–4.9)
Alkaline Phosphatase: 116 IU/L (ref 44–121)
BUN/Creatinine Ratio: 13 (ref 9–23)
BUN: 12 mg/dL (ref 6–24)
Bilirubin Total: 0.2 mg/dL (ref 0.0–1.2)
CO2: 25 mmol/L (ref 20–29)
Calcium: 9.6 mg/dL (ref 8.7–10.2)
Chloride: 102 mmol/L (ref 96–106)
Creatinine, Ser: 0.89 mg/dL (ref 0.57–1.00)
Globulin, Total: 2.3 g/dL (ref 1.5–4.5)
Glucose: 118 mg/dL — ABNORMAL HIGH (ref 70–99)
Potassium: 4.5 mmol/L (ref 3.5–5.2)
Sodium: 143 mmol/L (ref 134–144)
Total Protein: 6.9 g/dL (ref 6.0–8.5)
eGFR: 78 mL/min/{1.73_m2} (ref >59)

## 2022-06-17 LAB — LIPID PANEL W/O CHOL/HDL RATIO
Cholesterol, Total: 165 mg/dL (ref 100–199)
HDL: 38 mg/dL — ABNORMAL LOW (ref >39)
LDL Chol Calc (NIH): 92 mg/dL (ref 0–99)
Triglycerides: 208 mg/dL — ABNORMAL HIGH (ref 0–149)
VLDL Cholesterol Cal: 35 mg/dL (ref 5–40)

## 2022-06-17 MED ORDER — ROSUVASTATIN CALCIUM 40 MG PO TABS: 40.0000 mg | ORAL_TABLET | Freq: Every day | ORAL | 3 refills | Status: DC

## 2022-06-17 MED ORDER — TIRZEPATIDE 5 MG/0.5ML ~~LOC~~ SOAJ: 5.0000 mg | SUBCUTANEOUS | 0 refills | Status: DC

## 2022-06-17 MED ORDER — TIRZEPATIDE 7.5 MG/0.5ML ~~LOC~~ SOAJ: 7.5000 mg | SUBCUTANEOUS | 0 refills | Status: DC

## 2022-06-17 NOTE — Progress Notes (Signed)
Contacted via [facility] evening Anjana, your labs have returned: - Kidney function, creatinine and eGFR, remains normal, as is liver function, AST and ALT.  - Cholesterol levels are not quite at goal.  I would recommend we stop Atorvastatin and try switching you to Rosuvastatin 40 MG daily.  Thoughts on this?  It may offer more benefit to your LDL -- goal <70, and triglycerides.  Let me know if you are okay with the change. Keep being amazing!!  Thank you for allowing me to participate in your care.  I appreciate you. Kindest regards, Drianna Chandran

## 2022-06-21 ENCOUNTER — Ambulatory Visit (INDEPENDENT_AMBULATORY_CARE_PROVIDER_SITE_OTHER): Payer: Commercial Managed Care - HMO | Admitting: Psychology

## 2022-06-21 DIAGNOSIS — F309 Manic episode, unspecified: Secondary | ICD-10-CM

## 2022-06-21 NOTE — Progress Notes (Signed)
[facility] Counselor/Therapist Progress Note  Patient ID: Lori Knight, MRN: [id number],    Date: 06/21/2022  Time Spent: 8:03 am - 8:52 am:  49 minutes  Treatment Type: Individual Therapy.  Reported Symptoms: Anxiety   Mental Status Exam: Appearance:  Neat and Well Groomed     Behavior: Appropriate  Motor: Normal  Speech/Language:  Clear and Coherent and Normal Rate  Affect: Congruent  Mood: normal  Thought process: normal  Thought content:   WNL  Sensory/Perceptual disturbances:   WNL  Orientation: oriented to person, place, time/date, and situation  Attention: Good  Concentration: Good  Memory: [place] of knowledge:  Good  Insight:   Good  Judgment:  Good  Impulse Control: Good   Subjective:   Lori Knight participated from home, via video and consented to treatment. Therapist participated from office. We met online due to [place] pandemic. 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 She noted a recent medication for her diabetes medication but noted an increase in energy. She noted this having an appetite suppression, which is a welcome effect. She noted increased financial stressors due to the cost of her medication. She discussed her attempts to manage this anxiety. We explored this further. We processed her recent doctor appointment and discussed the effect of this on her mood. Therapist validated Lori Knight's feelings and experience and praised her for advocating for self. She noted her daughter's recent breakup and noting this reminding this of her own upbringing in an alcoholic household as her daughter's girlfriend lives with an alcoholic parent.We continued to explored this along with her daughter's unaddressed mental health issues. Therapist validated and normalized Lori Knight's feelings and experience. Therapist provided supportive therapy. A follow-up was scheduled for continued treatment.   Interventions:  Interpersonal  Diagnosis:Bipolar I disorder, single manic episode ([place])  Plan: Patient is to use CBT, ACT, BA, mindfulness, and coping skills to help manage decrease symptoms associated with their diagnosis. (Target Date 03/16/23)   Long-term goal:   Reduce overall level, frequency, and intensity of the feelings of depression and anxiety evidenced by negative self-talk, rumination, and poor self-esteem  from 6 to 7 days/week to 0 to 1 days/week per client report for at least 3 consecutive months. Engage in consistent self-care and mindfulness.   Short-term goal:  Verbally express understanding of the relationship between feelings of depression, bipolar, anxiety and their impact on thinking patterns and behaviors.  Verbalize an understanding of the role that distorted thinking plays in creating fears, excessive worry, and ruminations.   Lori Knight participated in the creation of the treatment plan)  [name], LCSW

## 2022-07-04 ENCOUNTER — Other Ambulatory Visit: Payer: Self-pay | Admitting: Nurse Practitioner

## 2022-07-07 MED ORDER — TIRZEPATIDE 7.5 MG/0.5ML ~~LOC~~ SOAJ: 7.5000 mg | SUBCUTANEOUS | 0 refills | Status: DC

## 2022-07-07 NOTE — Addendum Note (Signed)
Addended by: [name] on: 07/07/2022 12:18 PM   Modules accepted: Orders

## 2022-07-09 ENCOUNTER — Telehealth: Payer: Self-pay | Admitting: *Deleted

## 2022-07-09 NOTE — Telephone Encounter (Signed)
Was going to send off auth request under [place] but after reading last note, holding off until actual visit in case patient is again not happy with provider or surgical plan.

## 2022-07-19 NOTE — Patient Instructions (Signed)
Diabetes Mellitus Basics  Diabetes mellitus, or diabetes, is a long-term (chronic) disease. It occurs when the body does not properly use sugar (glucose) that is released from food after you eat. Diabetes mellitus may be caused by one or both of these problems: Your pancreas does not make enough of a hormone called insulin. Your body does not react in a normal way to the insulin that it makes. Insulin lets glucose enter cells in your body. This gives you energy. If you have diabetes, glucose cannot get into cells. This causes high blood glucose (hyperglycemia). How to treat and manage diabetes You may need to take insulin or other diabetes medicines daily to keep your glucose in balance. If you are prescribed insulin, you will learn how to give yourself insulin by injection. You may need to adjust the amount of insulin you take based on the foods that you eat. You will need to check your blood glucose levels using a glucose monitor as told by your health care provider. The readings can help determine if you have low or high blood glucose. Generally, you should have these blood glucose levels: Before meals (preprandial): 80-130 mg/dL (4.4-7.2 mmol/L). After meals (postprandial): below 180 mg/dL (10 mmol/L). Hemoglobin A1c (HbA1c) level: less than 7%. Your health care provider will set treatment goals for you. Keep all follow-up visits. This is important. Follow these instructions at home: Diabetes medicines Take your diabetes medicines every day as told by your health care provider. List your diabetes medicines here: Name of medicine: ______________________________ Amount (dose): _______________ Time (a.m./p.m.): _______________ Notes: ___________________________________ Name of medicine: ______________________________ Amount (dose): _______________ Time (a.m./p.m.): _______________ Notes: ___________________________________ Name of medicine: ______________________________ Amount (dose):  _______________ Time (a.m./p.m.): _______________ Notes: ___________________________________ Insulin If you use insulin, list the types of insulin you use here: Insulin type: ______________________________ Amount (dose): _______________ Time (a.m./p.m.): _______________Notes: ___________________________________ Insulin type: ______________________________ Amount (dose): _______________ Time (a.m./p.m.): _______________ Notes: ___________________________________ Insulin type: ______________________________ Amount (dose): _______________ Time (a.m./p.m.): _______________ Notes: ___________________________________ Insulin type: ______________________________ Amount (dose): _______________ Time (a.m./p.m.): _______________ Notes: ___________________________________ Insulin type: ______________________________ Amount (dose): _______________ Time (a.m./p.m.): _______________ Notes: ___________________________________ Managing blood glucose  Check your blood glucose levels using a glucose monitor as told by your health care provider. Write down the times that you check your glucose levels here: Time: _______________ Notes: ___________________________________ Time: _______________ Notes: ___________________________________ Time: _______________ Notes: ___________________________________ Time: _______________ Notes: ___________________________________ Time: _______________ Notes: ___________________________________ Time: _______________ Notes: ___________________________________  Low blood glucose Low blood glucose (hypoglycemia) is when glucose is at or below 70 mg/dL (3.9 mmol/L). Symptoms may include: Feeling: Hungry. Sweaty and clammy. Irritable or easily upset. Dizzy. Sleepy. Having: A fast heartbeat. A headache. A change in your vision. Numbness around the mouth, lips, or tongue. Having trouble with: Moving (coordination). Sleeping. Treating low blood glucose To treat low blood  glucose, eat or drink something containing sugar right away. If you can think clearly and swallow safely, follow the 15:15 rule: Take 15 grams of a fast-acting carb (carbohydrate), as told by your health care provider. Some fast-acting carbs are: Glucose tablets: take 3-4 tablets. Hard candy: eat 3-5 pieces. Fruit juice: drink 4 oz (120 mL). Regular (not diet) soda: drink 4-6 oz (120-180 mL). Honey or sugar: eat 1 Tbsp (15 mL). Check your blood glucose levels 15 minutes after you take the carb. If your glucose is still at or below 70 mg/dL (3.9 mmol/L), take 15 grams of a carb again. If your glucose does not go above 70 mg/dL (3.9 mmol/L) after   3 tries, get help right away. After your glucose goes back to normal, eat a meal or a snack within 1 hour. Treating very low blood glucose If your glucose is at or below 54 mg/dL (3 mmol/L), you have very low blood glucose (severe hypoglycemia). This is an emergency. Do not wait to see if the symptoms will go away. Get medical help right away. Call your local emergency services (911 in the [place] Do not drive yourself to the hospital. Questions to ask your health care provider Should I talk with a diabetes educator? What equipment will I need to care for myself at home? What diabetes medicines do I need? When should I take them? How often do I need to check my blood glucose levels? What number can I call if I have questions? When is my follow-up visit? Where can I find a support group for people with diabetes? Where to find more information American Diabetes Association: www.diabetes.org Association of Diabetes Care and Education Specialists: www.diabeteseducator.org Contact a health care provider if: Your blood glucose is at or above 240 mg/dL (13.3 mmol/L) for 2 days in a row. You have been sick or have had a fever for 2 days or more, and you are not getting better. You have any of these problems for more than 6 hours: You cannot eat or  drink. You feel nauseous. You vomit. You have diarrhea. Get help right away if: Your blood glucose is lower than 54 mg/dL (3 mmol/L). You get confused. You have trouble thinking clearly. You have trouble breathing. These symptoms may represent a serious problem that is an emergency. Do not wait to see if the symptoms will go away. Get medical help right away. Call your local emergency services (911 in the [place] Do not drive yourself to the hospital. Summary Diabetes mellitus is a chronic disease that occurs when the body does not properly use sugar (glucose) that is released from food after you eat. Take insulin and diabetes medicines as told. Check your blood glucose every day, as often as told. Keep all follow-up visits. This is important. This information is not intended to replace advice given to you by your health care provider. Make sure you discuss any questions you have with your health care provider. Document Revised: 10/30/2019 Document Reviewed: 10/30/2019 Elsevier Patient Education  2023 Elsevier Inc.  

## 2022-07-21 ENCOUNTER — Encounter: Payer: Self-pay | Admitting: Nurse Practitioner

## 2022-07-21 ENCOUNTER — Ambulatory Visit (INDEPENDENT_AMBULATORY_CARE_PROVIDER_SITE_OTHER): Payer: Commercial Managed Care - HMO | Admitting: Nurse Practitioner

## 2022-07-21 DIAGNOSIS — E1169 Type 2 diabetes mellitus with other specified complication: Secondary | ICD-10-CM | POA: Diagnosis not present

## 2022-07-21 NOTE — Progress Notes (Signed)
BP 118/84   Pulse 71   Temp 97.8 F (36.6 C) (Oral)   Ht 5' 5.98" (1.676 m)   Wt 268 lb 1.6 oz (121.6 kg)   SpO2 99%   BMI 43.29 kg/m    Subjective:    Patient ID: Lori Knight, female    DOB: February 26, 1971, 52 y.o.   MRN: [id number]  HPI: Lori Knight is a 52 y.o. female  Chief Complaint  Patient presents with   Diabetes    Added Mounjaro at last visit. Doing well on Mounjaro   DIABETES A1c December 6.9%. Continues on Metformin 500 MG in morning and 500 MG at night.  We changed to [facility] and stopped Trulicity last visit due to no longer losing weight and loss of benefit.  Has lost 11 pounds since starting [place] and a lot feels better -- feeling motivated.  This week started 7.5 MG dosing.   Hypoglycemic episodes:no Polydipsia/polyuria: no Visual disturbance: no Chest pain: no Paresthesias: no Glucose Monitoring: no  Accucheck frequency: Not Checking  Fasting glucose:  Post prandial:  Evening:  Before meals: Taking Insulin?: no  Long acting insulin:  Short acting insulin: Blood Pressure Monitoring: not checking Retinal Examination: Up to Date Foot Exam: Up to Date Pneumovax: Up to Date Influenza: Up to Date Aspirin: no   Relevant past medical, surgical, family and social history reviewed and updated as indicated. Interim medical history since our last visit reviewed. Allergies and medications reviewed and updated.  Review of Systems  Constitutional:  Negative for activity change, appetite change, diaphoresis, fatigue and fever.  Respiratory:  Negative for cough, chest tightness and shortness of breath.   Cardiovascular:  Negative for chest pain, palpitations and leg swelling.  Gastrointestinal: Negative.   Endocrine: Negative for cold intolerance, heat intolerance, polydipsia, polyphagia and polyuria.  Neurological: Negative.   Psychiatric/Behavioral: Negative.     Per HPI unless specifically indicated above     Objective:    BP 118/84   Pulse  71   Temp 97.8 F (36.6 C) (Oral)   Ht 5' 5.98" (1.676 m)   Wt 268 lb 1.6 oz (121.6 kg)   SpO2 99%   BMI 43.29 kg/m   Wt Readings from Last 3 Encounters:  07/21/22 268 lb 1.6 oz (121.6 kg)  06/16/22 279 lb 4.8 oz (126.7 kg)  05/03/22 276 lb 12.8 oz (125.6 kg)    Physical Exam Vitals and nursing note reviewed.  Constitutional:      General: She is awake. She is not in acute distress.    Appearance: She is well-developed and well-groomed. She is morbidly obese. She is not ill-appearing.  HENT:     Head: Normocephalic.     Right Ear: Hearing normal.     Left Ear: Hearing normal.  Eyes:     General: Lids are normal.        Right eye: No discharge.        Left eye: No discharge.     Conjunctiva/sclera: Conjunctivae normal.     Pupils: Pupils are equal, round, and reactive to light.  Neck:     Thyroid: No thyromegaly.     Vascular: No carotid bruit.  Cardiovascular:     Rate and Rhythm: Normal rate and regular rhythm.     Heart sounds: Normal heart sounds. No murmur heard.    No gallop.  Pulmonary:     Effort: Pulmonary effort is normal. No accessory muscle usage or respiratory distress.     Breath sounds: Normal  breath sounds.  Abdominal:     General: Bowel sounds are normal. There is no distension.     Palpations: Abdomen is soft. There is no hepatomegaly.     Tenderness: There is no abdominal tenderness. There is no right CVA tenderness or left CVA tenderness.     Hernia: No hernia is present.  Musculoskeletal:     Cervical back: Normal range of motion and neck supple.     Right lower leg: No edema.     Left lower leg: No edema.  Lymphadenopathy:     Cervical: No cervical adenopathy.  Skin:    General: Skin is warm and dry.  Neurological:     Mental Status: She is alert and oriented to person, place, and time.  Psychiatric:        Attention and Perception: Attention normal.        Mood and Affect: Mood normal.        Speech: Speech normal.        Behavior:  Behavior normal. Behavior is cooperative.        Thought Content: Thought content normal.    Results for orders placed or performed in visit on 06/16/22  Bayer DCA Hb A1c Waived  Result Value Ref Range   HB A1C (BAYER DCA - WAIVED) 6.9 (H) 4.8 - 5.6 %  Comprehensive metabolic panel  Result Value Ref Range   Glucose 118 (H) 70 - 99 mg/dL   BUN 12 6 - 24 mg/dL   Creatinine, Ser 0.89 0.57 - 1.00 mg/dL   eGFR 78 >59 mL/min/1.73   BUN/Creatinine Ratio 13 9 - 23   Sodium 143 134 - 144 mmol/L   Potassium 4.5 3.5 - 5.2 mmol/L   Chloride 102 96 - 106 mmol/L   CO2 25 20 - 29 mmol/L   Calcium 9.6 8.7 - 10.2 mg/dL   Total Protein 6.9 6.0 - 8.5 g/dL   Albumin 4.6 3.8 - 4.9 g/dL   Globulin, Total 2.3 1.5 - 4.5 g/dL   Albumin/Globulin Ratio 2.0 1.2 - 2.2   Bilirubin Total 0.2 0.0 - 1.2 mg/dL   Alkaline Phosphatase 116 44 - 121 IU/L   AST 14 0 - 40 IU/L   ALT 23 0 - 32 IU/L  Lipid Panel w/o Chol/HDL Ratio  Result Value Ref Range   Cholesterol, Total 165 100 - 199 mg/dL   Triglycerides 208 (H) 0 - 149 mg/dL   HDL 38 (L) >39 mg/dL   VLDL Cholesterol Cal 35 5 - 40 mg/dL   LDL Chol Calc (NIH) 92 0 - 99 mg/dL      Assessment & Plan:   Problem List Items Addressed This Visit       Endocrine   Type 2 diabetes mellitus with morbid obesity ([place]) - Primary    Chronic, ongoing with A1c 6.9% last visit, slight trend up, and urine ALB 10 (February 2023).  Continue Losartan for kidney protection.  Tolerating Mounjaro well with improved overall health and weight loss of 11 pounds, much improved from Trulicity.  Will continue Metformin as ordered.  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  Educated on BS goals in morning and 2 hours after meals.  Return in 2 months.          Follow up plan: Return in about 2 months (around 09/19/2022) for T2DM, HTN/HLD, MOOD, OSA -- needs Td vaccine.

## 2022-07-21 NOTE — Assessment & Plan Note (Signed)
Chronic, ongoing with A1c 6.9% last visit, slight trend up, and urine ALB 10 (February 2023).  Continue Losartan for kidney protection.  Tolerating Mounjaro well with improved overall health and weight loss of 11 pounds, much improved from Trulicity.  Will continue Metformin as ordered.  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  Educated on BS goals in morning and 2 hours after meals.  Return in 2 months.

## 2022-07-22 ENCOUNTER — Ambulatory Visit (INDEPENDENT_AMBULATORY_CARE_PROVIDER_SITE_OTHER): Payer: Commercial Managed Care - HMO | Admitting: Psychology

## 2022-07-22 DIAGNOSIS — F309 Manic episode, unspecified: Secondary | ICD-10-CM

## 2022-07-22 NOTE — Progress Notes (Signed)
[facility] Counselor/Therapist Progress Note  Patient ID: Lori Knight, MRN: [id number],    Date: 07/22/2022  Time Spent: 11:03 am - 11:53 am:  50 minutes  Treatment Type: Individual Therapy.  Reported Symptoms: Anxiety   Mental Status Exam: Appearance:  Neat and Well Groomed     Behavior: Appropriate  Motor: Normal  Speech/Language:  Clear and Coherent and Normal Rate  Affect: Congruent  Mood: normal  Thought process: normal  Thought content:   WNL  Sensory/Perceptual disturbances:   WNL  Orientation: oriented to person, place, time/date, and situation  Attention: Good  Concentration: Good  Memory: [place] of knowledge:  Good  Insight:   Good  Judgment:  Good  Impulse Control: Good   Subjective:   Lori Knight participated from home, via video and consented to treatment. Therapist participated from home office. We met online due to [place] pandemic. She noted having a positive holiday. She noted receiving an award at work. She noted transitions in her husband's work and noted increased stressors which resulted in him quitting his job. She denied an increase in her anxiety as result of this transition. She noted proactively working on maintaining her finances consistently. She noted working on managing her anxiety. She noted her efforts to maintain her own boundaries with her daughter in regards to over extending. She noted working on identifying ways to provide support to her daughter without enabling or overtaking tasks for her daughter. She noted a recent experience with significant negative self-talk and tearfulness. She noted her own efforts to fix situations, in the past, was due to an alcoholic father & a mentally ill and substance using mother. We explored this during the session and worked on identifying ways to manage distress more proactively and increase mindfulness of day-to-day responsibilities and overall output. We discussed the topic of radical  acceptance and discussed distress tolerance during the session. Therapist provided psycho-education regarding RA and distress tolerance and provided a handout, via email, for review. Therapist validated and normalized Lori Knight's feelings and experience. Therapist provided supportive therapy. A follow-up was scheduled for continued treatment.   Interventions: Interpersonal  Diagnosis:Bipolar I disorder, single manic episode ([place])  Plan: Patient is to use CBT, ACT, BA, mindfulness, and coping skills to help manage decrease symptoms associated with their diagnosis. (Target Date 03/16/23)   Long-term goal:   Reduce overall level, frequency, and intensity of the feelings of depression and anxiety evidenced by negative self-talk, rumination, and poor self-esteem  from 6 to 7 days/week to 0 to 1 days/week per client report for at least 3 consecutive months. Engage in consistent self-care and mindfulness.   Short-term goal:  Verbally express understanding of the relationship between feelings of depression, bipolar, anxiety and their impact on thinking patterns and behaviors.  Verbalize an understanding of the role that distorted thinking plays in creating fears, excessive worry, and ruminations.   Lori Knight participated in the creation of the treatment plan)  [name], LCSW

## 2022-07-26 ENCOUNTER — Encounter: Payer: Self-pay | Admitting: Nurse Practitioner

## 2022-07-27 ENCOUNTER — Ambulatory Visit (INDEPENDENT_AMBULATORY_CARE_PROVIDER_SITE_OTHER): Payer: Commercial Managed Care - HMO | Admitting: Plastic Surgery

## 2022-07-27 ENCOUNTER — Encounter: Payer: Self-pay | Admitting: Plastic Surgery

## 2022-07-27 VITALS — BP 164/95 | HR 81 | Ht 66.0 in | Wt 268.8 lb

## 2022-07-27 DIAGNOSIS — R21 Rash and other nonspecific skin eruption: Secondary | ICD-10-CM

## 2022-07-27 DIAGNOSIS — F309 Manic episode, unspecified: Secondary | ICD-10-CM

## 2022-07-27 DIAGNOSIS — N62 Hypertrophy of breast: Secondary | ICD-10-CM

## 2022-07-27 DIAGNOSIS — M542 Cervicalgia: Secondary | ICD-10-CM | POA: Diagnosis not present

## 2022-07-27 DIAGNOSIS — G3184 Mild cognitive impairment, so stated: Secondary | ICD-10-CM

## 2022-07-27 DIAGNOSIS — Z6841 Body Mass Index (BMI) 40.0 and over, adult: Secondary | ICD-10-CM

## 2022-07-27 DIAGNOSIS — M546 Pain in thoracic spine: Secondary | ICD-10-CM | POA: Diagnosis not present

## 2022-07-27 DIAGNOSIS — E785 Hyperlipidemia, unspecified: Secondary | ICD-10-CM

## 2022-07-27 DIAGNOSIS — R809 Proteinuria, unspecified: Secondary | ICD-10-CM

## 2022-07-27 DIAGNOSIS — M549 Dorsalgia, unspecified: Secondary | ICD-10-CM | POA: Diagnosis not present

## 2022-07-27 DIAGNOSIS — E1169 Type 2 diabetes mellitus with other specified complication: Secondary | ICD-10-CM

## 2022-07-27 DIAGNOSIS — E1129 Type 2 diabetes mellitus with other diabetic kidney complication: Secondary | ICD-10-CM

## 2022-07-27 NOTE — Progress Notes (Signed)
Subjective:    Patient ID: Lori Knight, female    DOB: Nov 11, 1970, 52 y.o.   MRN: [id number]  The patient is a 52 year old female here for evaluation of her breasts.  She complains of very large breasts that cause back and neck pain.  She complains of upper and lower back pain.  She often has to pin her straps which are causing grooves in her shoulders.  She has tried pain medicine which helps temporarily but the pain continues after the medication wears off.  She has tried creams and powders under her breast to help with the skin breakdown.  She did have physical therapy in [place] in 2022.  The patient states that activity is hindered by her large breasts.  The patient is 5 feet 6 inches tall and weighs 268 pounds.  Her preoperative bra size is a G cup size and her BMI is 43.3.  The estimated amount of tissue to be removed from her breast would be 1100 g from each side.  Her sternal notch to nipple distance on the right is 42 cm and on the left 42 cm with an inframammary distance to the nipple areola at 17 cm.  Her past medical history includes back pain, heart disease, hypertension, thyroid disease, kidney problems, bipolar disorder, anxiety, diabetes, meningioma and shortness of breath.  Her past surgical history is listed below and includes a cholecystectomy, septoplasty, dental surgery, transurethral resection of bladder tumor and a hysterectomy.  She also had a cyst removed from the left breast at the inframammary fold.     Review of Systems  Constitutional: Negative.   Eyes: Negative.   Respiratory: Negative.  Negative for chest tightness and shortness of breath.   Cardiovascular: Negative.   Gastrointestinal: Negative.   Endocrine: Negative.   Genitourinary: Negative.   Musculoskeletal:  Positive for back pain and neck pain.  Skin:  Positive for rash.  Hematological: Negative.        Objective:   Physical Exam Vitals reviewed.  Constitutional:      Appearance: Normal  appearance.  HENT:     Head: Atraumatic.  Cardiovascular:     Rate and Rhythm: Normal rate.     Pulses: Normal pulses.  Pulmonary:     Effort: Pulmonary effort is normal.  Abdominal:     General: There is no distension.     Palpations: Abdomen is soft. There is no mass.     Tenderness: There is no abdominal tenderness.  Musculoskeletal:        General: No swelling or deformity.  Skin:    General: Skin is warm.     Capillary Refill: Capillary refill takes less than 2 seconds.     Coloration: Skin is not jaundiced.     Findings: No bruising or lesion.  Neurological:     Mental Status: She is alert and oriented to person, place, and time.  Psychiatric:        Mood and Affect: Mood normal.        Behavior: Behavior normal.        Thought Content: Thought content normal.      Assessment & Plan:     ICD-10-CM   1. Macromastia  N62     2. Mild cognitive impairment  G31.84     3. Bipolar I disorder, single manic episode ([place])  F30.9     4. Type 2 diabetes mellitus with proteinuria ([facility])  E11.29    R80.9     5. Hyperlipidemia associated  with type 2 diabetes mellitus ([place])  E11.69    E78.5       The procedure the patient selected and that was best for the patient was discussed. The risk were discussed and include but not limited to the following:  Breast asymmetry, fluid accumulation, firmness of the breast, inability to breast feed, loss of nipple or areola, skin loss, change in skin and nipple sensation, fat necrosis of the breast tissue, bleeding, infection and healing delay.  There are risks of anesthesia and injury to nerves or blood vessels.  Allergic reaction to tape, suture and skin glue are possible.  There will be swelling.  Any of these can lead to the need for revisional surgery.  A breast reduction has potential to interfere with diagnostic procedures in the future.  This procedure is best done when the breast is fully developed.  Changes in the breast will continue to  occur over time: pregnancy, weight gain or weigh loss.    Total time: 40 minutes. This includes time spent with the patient during the visit as well as time spent before and after the visit reviewing the chart, documenting the encounter, ordering pertinent studies and literature for the patient.     The patient is a good candidate for bilateral breast reduction with possible liposuction.  She is aware that she may need amputation technique and that she will not have nipple areola sensation or be able to breast-feed in the future.  This should not be an issue since she has had a hysterectomy anyway.  I also talked about her risk of wound breakdown and incisions opening.  The patient is aware.  She would like to move forward with surgery.  Will also make sure her hemoglobin A1c is less than 6.5.  Pictures were obtained of the patient and placed in the chart with the patient's or guardian's permission.

## 2022-07-28 ENCOUNTER — Encounter: Payer: Self-pay | Admitting: Nurse Practitioner

## 2022-08-03 ENCOUNTER — Other Ambulatory Visit: Payer: Self-pay | Admitting: Nurse Practitioner

## 2022-08-03 ENCOUNTER — Telehealth: Payer: Self-pay | Admitting: *Deleted

## 2022-08-03 NOTE — Telephone Encounter (Signed)
Requested medication (s) are due for refill today - yes  Requested medication (s) are on the active medication list -yes  Future visit scheduled -yes  Last refill: 07/07/22 33m  Notes to clinic: medication not assigned protocol- requires provider review    Requested Prescriptions  Pending Prescriptions Disp Refills   MOUNJARO 7.5 MG/0.5ML Pen [Pharmacy Med Name: MOUNJARO 7.5 MG/0.5 ML PEN]      Sig: INJECT 7.5 MG SUBCUTANEOUSLY WEEKLY     Off-Protocol Failed - 08/03/2022  1:07 AM      Failed - Medication not assigned to a protocol, review manually.      Passed - Valid encounter within last 12 months    Recent Outpatient Visits           1 week ago Type 2 diabetes mellitus with morbid obesity ([place]   [place] J[place], NP   1 month ago Type 2 diabetes mellitus with proteinuria ([place]   [place] JG[place]T, NP   5 months ago Type 2 diabetes mellitus with proteinuria ([place]   [place] JBarbaraann Faster NP   6 months ago Memory changes   [place] J[place]lin [place]T, NP   8 months ago Chronic RLQ pain   [place] JBarbaraann Faster NP       Future Appointments             In 1 week McG[place]an, SGordan PaymentC[place]  In 1 month CVenita Lick NP C[place]  In 3 months KRalene Bathe MD CSpr[place]              Requested Prescriptions  Pending Prescriptions Disp Refills   MOUNJARO 7.5 MG/0.5ML Pen [Pharmacy Med Name: MOUNJARO 7.5 MG/0.5 ML PEN]      Sig: INJECT 7.5 MG SUBCUTANEOUSLY WEEKLY     Off-Protocol Failed - 08/03/2022  1:07 AM      Failed - Medication not assigned to a protocol, review manually.      Passed - Valid encounter within last 12 months    Recent Outpatient Visits           1 week ago Type 2 diabetes mellitus with  morbid obesity ([place]   CSan JoaquinCAult JLuanaT, NP   1 month ago Type 2 diabetes mellitus with proteinuria ([place]   [place] J[place]T, NP   5 months ago Type 2 diabetes mellitus with proteinuria ([place]   [place] JBarbaraann Faster NP   6 months ago Memory changes   CR[place] JHenrine ScrewsT, NP   8 months ago Chronic RLQ pain   CP[place] JBarbaraann Faster NP       Future Appointments             In 1 week McGowan, SGordan PaymentCChesil[place]  In 1 month CC[place] JBarbaraann Faster NP C[place] PH[place]  In 3 months KRalene Bathe MD CNorthway

## 2022-08-03 NOTE — Telephone Encounter (Signed)
This message was sent via [place], a product from [employer] http://www.biscom.com/                    -------Fax Transmission Report-------  To:               Recipient at [phone] Subject:          [place] [phone] Result:           The transmission was successful. Explanation:      All Pages Ok Pages Sent:       17 Connect Time:     25 minutes, 51 seconds Transmit Time:    08/03/2022 13:08 Transfer Rate:    14400 Status Code:      0000 Retry Count:      0 Job Id:           [id number] Unique Id:        [id number] Fax Line:         43 Fax Server:       [id number]

## 2022-08-03 NOTE — Telephone Encounter (Signed)
Auth request sent via fax for CPT [id number]

## 2022-08-04 ENCOUNTER — Encounter: Payer: Self-pay | Admitting: Nurse Practitioner

## 2022-08-04 ENCOUNTER — Encounter: Payer: Commercial Managed Care - HMO | Admitting: Psychology

## 2022-08-04 NOTE — Progress Notes (Signed)
This encounter was created in error - please disregard.

## 2022-08-04 NOTE — Telephone Encounter (Signed)
Pharmacy comment: Alternative Requested:INSURANCE WILL PAY FOR TRULICITY.

## 2022-08-05 ENCOUNTER — Telehealth: Payer: Self-pay | Admitting: *Deleted

## 2022-08-05 NOTE — Telephone Encounter (Signed)
Notified patient of denial. She requested a copy of the denial that we received. Msg sent to front desk for that.

## 2022-08-10 NOTE — Telephone Encounter (Signed)
Left patient multiple messages with no answer.

## 2022-08-11 ENCOUNTER — Ambulatory Visit
Admission: RE | Admit: 2022-08-11 | Discharge: 2022-08-11 | Disposition: A | Discharge status: Home / Self Care | Payer: Commercial Managed Care - HMO | Source: Ambulatory Visit | Attending: Urology | Admitting: Urology

## 2022-08-11 DIAGNOSIS — D1771 Benign lipomatous neoplasm of kidney: Secondary | ICD-10-CM | POA: Diagnosis present

## 2022-08-13 ENCOUNTER — Telehealth: Payer: Self-pay

## 2022-08-13 NOTE — Progress Notes (Signed)
Rescheduled

## 2022-08-13 NOTE — Telephone Encounter (Signed)
PA for Mounjaro 7.5 has been approved

## 2022-08-16 ENCOUNTER — Other Ambulatory Visit: Payer: Self-pay

## 2022-08-16 ENCOUNTER — Ambulatory Visit: Payer: Commercial Managed Care - HMO | Admitting: Urology

## 2022-08-16 ENCOUNTER — Other Ambulatory Visit
Admission: RE | Admit: 2022-08-16 | Discharge: 2022-08-16 | Disposition: A | Discharge status: Home / Self Care | Payer: Commercial Managed Care - HMO | Attending: Urology | Admitting: Urology

## 2022-08-16 ENCOUNTER — Encounter: Payer: Self-pay | Admitting: Urology

## 2022-08-16 ENCOUNTER — Telehealth: Payer: Commercial Managed Care - HMO | Admitting: Urology

## 2022-08-16 VITALS — BP 137/90 | HR 74 | Ht 66.0 in | Wt 267.2 lb

## 2022-08-16 DIAGNOSIS — N3946 Mixed incontinence: Secondary | ICD-10-CM

## 2022-08-16 DIAGNOSIS — Z87448 Personal history of other diseases of urinary system: Secondary | ICD-10-CM | POA: Insufficient documentation

## 2022-08-16 DIAGNOSIS — R351 Nocturia: Secondary | ICD-10-CM

## 2022-08-16 DIAGNOSIS — D1771 Benign lipomatous neoplasm of kidney: Secondary | ICD-10-CM

## 2022-08-16 DIAGNOSIS — R319 Hematuria, unspecified: Secondary | ICD-10-CM | POA: Diagnosis present

## 2022-08-16 LAB — URINALYSIS, COMPLETE (UACMP) WITH MICROSCOPIC
Bilirubin Urine: NEGATIVE
Glucose, UA: NEGATIVE mg/dL
Ketones, ur: NEGATIVE mg/dL
Leukocytes,Ua: NEGATIVE
Nitrite: NEGATIVE
Protein, ur: NEGATIVE mg/dL
Specific Gravity, Urine: 1.02 (ref 1.005–1.030)
pH: 5.5 (ref 5.0–8.0)

## 2022-08-16 LAB — BLADDER SCAN AMB NON-IMAGING

## 2022-08-16 NOTE — Progress Notes (Unsigned)
This service is provided via telemedicine.   Patient consents to a telephone visit: Consent obtained.     Names of all persons participating in the telemedicine service and their role in the encounter: Zara Council, PA-C provider, Gordy Clement, CMA, Roe Rutherford, receptionist.

## 2022-08-16 NOTE — Progress Notes (Unsigned)
Virtual Visit via Video Note  I connected with [name] on 08/16/22 at  2:30 PM EST by a video enabled telemedicine application and verified that I am speaking with the correct person using two identifiers.  Location: Patient: *** Provider: ***   I discussed the limitations of evaluation and management by telemedicine and the availability of in person appointments. The patient expressed understanding and agreed to proceed.  History of Present Illness: Urological history: Intermediate risk hematuria  - Former smoker  - CTU 03/2018 negative  - Cystoscopy in 04/2018 with Dr. Erlene Quan noted a small 1 cm round papillary tumor at left dome of bladder highly concerning for bladder cancer - S/p TURBT in 04/2018; lesions was negative for malignancy consistent with cystitis cystica with focal calcification.    2. Bilateral angiomyolipomas  - Found on 03/2018 CTU: 3.7 cm right lower pole angiomyolipoma and 6 mm left kidney angiomyolipoma. - RUS 07/2020 No significant interval change in the known bilateral renal angiomyolipoma.  No hydronephrosis or other acute finding. - RUS 08/12/2021 visualized stable bilateral renal angiomyolipomas - RUS 08/11/2022 -right angio myelolipoma measures 4.3 x 3.5 x 3.3 cm today versus 4.0 x 3.6 x 3.2 cm previously - left  angio myelolipoma measures 9 x 8 x 8 mm today versus 8 x 8 x 8 mm   3. Mixed incontinence  - Completed 12 sessions of PTNS.    4. Nocturia  - Sleeping with CPAP      Observations/Objective:   Assessment and Plan:   Follow Up Instructions:    I discussed the assessment and treatment plan with the patient. The patient was provided an opportunity to ask questions and all were answered. The patient agreed with the plan and demonstrated an understanding of the instructions.   The patient was advised to call back or seek an in-person evaluation if the symptoms worsen or if the condition fails to improve as anticipated.  I provided ***  minutes of non-face-to-face time during this encounter.   [name] Alsobrook, PA-C

## 2022-08-17 ENCOUNTER — Telehealth: Payer: Commercial Managed Care - HMO | Admitting: Urology

## 2022-08-17 LAB — URINE CULTURE: Culture: <10000 — AB

## 2022-08-17 NOTE — Progress Notes (Unsigned)
Virtual Visit via Video Note  I connected with [name] on 08/17/22 at  7:30 AM EST by a video enabled telemedicine application and verified that I am speaking with the correct person using two identifiers.  Location: Patient: Home Provider: Home   I discussed the limitations of evaluation and management by telemedicine and the availability of in person appointments. The patient expressed understanding and agreed to proceed.  History of Present Illness: Urological history: Intermediate risk hematuria  - Former smoker  - CTU 03/2018 negative  - Cystoscopy in 04/2018 with Dr. Erlene Quan noted a small 1 cm round papillary tumor at left dome of bladder highly concerning for bladder cancer - S/p TURBT in 04/2018; lesions was negative for malignancy consistent with cystitis cystica with focal calcification.    2. Bilateral angiomyolipomas  - Found on 03/2018 CTU: 3.7 cm right lower pole angiomyolipoma and 6 mm left kidney angiomyolipoma. - RUS 07/2020 No significant interval change in the known bilateral renal angiomyolipoma.  No hydronephrosis or other acute finding. - RUS 08/12/2021 visualized stable bilateral renal angiomyolipomas - RUS 08/11/2022 -right angio myelolipoma measures 4.3 x 3.5 x 3.3 cm today versus 4.0 x 3.6 x 3.2 cm previously - left  angio myelolipoma measures 9 x 8 x 8 mm today versus 8 x 8 x 8 mm   3. Mixed incontinence  - Completed 12 sessions of PTNS.    4. Nocturia  - Sleeping with CPAP      Observations/Objective: Patient appears non distressed and is answering questions appropriately.   Patient denies any gross hematuria, dysuria or suprapubic/flank pain.  Patient denies any fevers, chills, nausea or vomiting.   Component     Latest Ref Rng 08/16/2022  Appearance     CLEAR  CLEAR   Color, Urine     YELLOW  YELLOW   Specific Gravity, Urine     1.005 - 1.030  1.020   pH     5.0 - 8.0  5.5   Glucose, UA     NEGATIVE mg/dL NEGATIVE   Hgb urine dipstick      NEGATIVE  TRACE !   Bilirubin Urine     NEGATIVE  NEGATIVE   Ketones, ur     NEGATIVE mg/dL NEGATIVE   Protein     NEGATIVE mg/dL NEGATIVE   Nitrite     NEGATIVE  NEGATIVE   Squamous Epithelial / HPF     0 - 5 /HPF 6-10   WBC, UA     0 - 5 WBC/hpf 0-5   RBC / HPF     0 - 5 RBC/hpf 6-10   Bacteria, UA     NONE SEEN  MANY !   Mucus PRESENT   Leukocytes,Ua     NEGATIVE  NEGATIVE   Budding Yeast PRESENT     Legend: ! Abnormal Narrative & Impression  CLINICAL DATA:  Follow-up angio myelolipoma is   EXAM: RENAL / URINARY TRACT ULTRASOUND COMPLETE   COMPARISON:  CT scan of the abdomen and pelvis December 15, 2021. Ultrasound August 12, 2021.   FINDINGS: Right Kidney:   Renal measurements: 11.9 x 5.2 x 6.8 cm = volume: 219 mL. The known hyperechoic angio myelolipoma measures 4.3 x 3.5 x 3.3 cm today versus 4.0 x 3.6 x 3.2 cm previously.   Left Kidney:   Renal measurements: 13.7 x 6.2 x 6.0 cm = volume: 262 mL. The known hyperechoic angio myelolipoma measures 9 x 8 x 8 mm today versus 8 x  8 x 8 mm previously.   Bladder:   Appears normal for degree of bladder distention.   Other:   None.   IMPRESSION: 1. The known angio myelolipoma in the right kidney measures slightly larger today. Whether this is a true slight increase in size or difference in measurement technique is unclear. Recommend continued attention on follow-up. 2. The known angio myelolipoma in the left kidney is not significantly changed. 3. No other abnormalities.     Electronically Signed   By: Dorise Bullion III M.D.   On: 08/11/2022 11:03    I have independently reviewed the films.  See HPI.     Assessment and Plan: Bilateral angiomyolipomas  - RUS notes possible increase in size of both AMLs - will obtain a CT scan for further evaluation - reviewed red flag signs   2. Intermediate risk hematuria  - former smoker - Hematuria work-up completed in 04/2018.  - Findings positive for  nodular area of cystitis cystica - no reports of gross heme - UA w/ micro heme - Urine culture has returned with insignificant growth, therefore we will need to pursue CT urogram -will schedule appointment for results    3. Mixed incontinence  - She has had symptomatic improvement  - She is not interested in any medications at this time    4. Nocturia  - Sleeping with CPAP machine  Follow Up Instructions:   I discussed the assessment and treatment plan with the patient. The patient was provided an opportunity to ask questions and all were answered. The patient agreed with the plan and demonstrated an understanding of the instructions.   The patient was advised to call back or seek an in-person evaluation if the symptoms worsen or if the condition fails to improve as anticipated.  I provided 20 minutes of non-face-to-face time during this encounter.   Ronna Herskowitz, PA-C

## 2022-08-18 ENCOUNTER — Ambulatory Visit (INDEPENDENT_AMBULATORY_CARE_PROVIDER_SITE_OTHER): Payer: Commercial Managed Care - HMO | Admitting: Psychology

## 2022-08-18 ENCOUNTER — Telehealth (INDEPENDENT_AMBULATORY_CARE_PROVIDER_SITE_OTHER): Payer: Commercial Managed Care - HMO | Admitting: Urology

## 2022-08-18 ENCOUNTER — Other Ambulatory Visit: Payer: Self-pay | Admitting: Nurse Practitioner

## 2022-08-18 ENCOUNTER — Encounter: Payer: Self-pay | Admitting: Urology

## 2022-08-18 DIAGNOSIS — F309 Manic episode, unspecified: Secondary | ICD-10-CM | POA: Diagnosis not present

## 2022-08-18 DIAGNOSIS — R351 Nocturia: Secondary | ICD-10-CM | POA: Diagnosis not present

## 2022-08-18 DIAGNOSIS — R319 Hematuria, unspecified: Secondary | ICD-10-CM

## 2022-08-18 DIAGNOSIS — R3121 Asymptomatic microscopic hematuria: Secondary | ICD-10-CM

## 2022-08-18 DIAGNOSIS — D1771 Benign lipomatous neoplasm of kidney: Secondary | ICD-10-CM

## 2022-08-18 DIAGNOSIS — N3946 Mixed incontinence: Secondary | ICD-10-CM | POA: Diagnosis not present

## 2022-08-18 MED ORDER — ROSUVASTATIN CALCIUM 40 MG PO TABS: 40.0000 mg | ORAL_TABLET | Freq: Every day | ORAL | 4 refills | Status: DC

## 2022-08-18 MED ORDER — LEVOTHYROXINE SODIUM 150 MCG PO TABS: 150.0000 ug | ORAL_TABLET | Freq: Every day | ORAL | 4 refills | Status: DC

## 2022-08-18 NOTE — Progress Notes (Signed)
[place] Counselor/Therapist Progress Note  Patient ID: Lori Knight, MRN: [id number],    Date: 08/18/2022  Time Spent: 9:02 am -  9:48 am:  46 minutes  Treatment Type: Individual Therapy.  Reported Symptoms: Anxiety   Mental Status Exam: Appearance:  Neat and Well Groomed     Behavior: Appropriate  Motor: Normal  Speech/Language:  Clear and Coherent and Normal Rate  Affect: Congruent  Mood: normal  Thought process: normal  Thought content:   WNL  Sensory/Perceptual disturbances:   WNL  Orientation: oriented to person, place, time/date, and situation  Attention: Good  Concentration: Good  Memory: Lori Knight of knowledge:  Good  Insight:   Good  Judgment:  Good  Impulse Control: Good   Subjective:   Lori Knight participated from home, via video and consented to treatment. Therapist participated from home office. We met online due to Lori Knight pandemic. She noted a continued increase in business and noted this providing relief in regards to her husband being out of work. 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 She noted relief in regard finances. She noted her daughter's poor mental health and Lori Knight's attempts to set boundaries in regards support vs. Enabling. Therapist encouraged continued boundary identification and setting. She noted her daughter's difficulty to engage in simple tasks which often result in Lori Knight taking over the task. We explored Lori Knight's worry and frustration regarding Lori Knight's unaddressed mental health and difficulty engaging tasks. She noted her daughter has had difficulty with consistency with pharmacological treatment. She noted frustration regarding the lack of influence on her daughter's behavior. We discussed possible resources for her daughter. Therapist validated and normalized Lori Knight's feelings and experience. Therapist provided supportive therapy. A follow-up was scheduled for continued treatment.   Interventions:  Cognitive Behavioral Therapy and Interpersonal  Diagnosis: Bipolar I disorder, single manic episode (Lori Knight)  Plan: Patient is to use CBT, ACT, BA, mindfulness, and coping skills to help manage decrease symptoms associated with their diagnosis. (Target Date 03/16/23)   Long-term goal:   Reduce overall level, frequency, and intensity of the feelings of depression and anxiety evidenced by negative self-talk, rumination, and poor self-esteem  from 6 to 7 days/week to 0 to 1 days/week per client report for at least 3 consecutive months. Engage in consistent self-care and mindfulness.   Short-term goal:  Verbally express understanding of the relationship between feelings of depression, bipolar, anxiety and their impact on thinking patterns and behaviors.  Verbalize an understanding of the role that distorted thinking plays in creating fears, excessive worry, and ruminations.   Lori Knight participated in the creation of the treatment plan)  [name], LCSW

## 2022-08-18 NOTE — Telephone Encounter (Signed)
Pharmacy request new meds L-THTROXINE 150 MCG , ROSUVASTATIN 40 MG

## 2022-08-18 NOTE — Telephone Encounter (Signed)
Medication refill for Levothyroxine last ov 07/21/22, upcoming ov 09/20/22 . Please advise

## 2022-08-28 ENCOUNTER — Encounter: Payer: Self-pay | Admitting: Dermatology

## 2022-08-30 ENCOUNTER — Other Ambulatory Visit: Payer: Self-pay | Admitting: Family Medicine

## 2022-08-30 ENCOUNTER — Ambulatory Visit: Admission: RE | Admit: 2022-08-30 | Payer: Commercial Managed Care - HMO | Source: Ambulatory Visit

## 2022-08-30 DIAGNOSIS — Z87448 Personal history of other diseases of urinary system: Secondary | ICD-10-CM

## 2022-09-08 NOTE — Telephone Encounter (Signed)
I don't know about this pt am not aware of a case #.  Did this get sent to me by mistake?  Let me know if I need to do something.

## 2022-09-09 ENCOUNTER — Ambulatory Visit: Payer: Commercial Managed Care - HMO | Admitting: Psychology

## 2022-09-13 NOTE — Addendum Note (Signed)
Addended by: Gaspar Cola L on: 09/13/2022 04:00 PM   Modules accepted: Orders

## 2022-09-16 ENCOUNTER — Telehealth: Payer: Commercial Managed Care - HMO | Admitting: Urology

## 2022-09-20 ENCOUNTER — Ambulatory Visit: Payer: Commercial Managed Care - HMO | Admitting: Nurse Practitioner

## 2022-09-23 NOTE — Progress Notes (Unsigned)
Virtual Visit via Video Note  I connected with Lori Knight on 09/23/22 at  8:00 AM EDT by a video enabled telemedicine application and verified that I am speaking with the correct person using two identifiers.  Location: Patient: Home Provider: Home   I discussed the limitations of evaluation and management by telemedicine and the availability of in person appointments. The patient expressed understanding and agreed to proceed.  History of Present Illness: Urological history: Intermediate risk hematuria  - Former smoker  - CTU 03/2018 negative  - Cystoscopy in 04/2018 with Dr. Erlene Quan noted a small 1 cm round papillary tumor at left dome of bladder highly concerning for bladder cancer - S/p TURBT in 04/2018; lesions was negative for malignancy consistent with cystitis cystica with focal calcification.    2. Bilateral angiomyolipomas  - Found on 03/2018 CTU: 3.7 cm right lower pole angiomyolipoma and 6 mm left kidney angiomyolipoma. - RUS 07/2020 No significant interval change in the known bilateral renal angiomyolipoma.  No hydronephrosis or other acute finding. - RUS 08/12/2021 visualized stable bilateral renal angiomyolipomas - RUS 08/11/2022 -right angio myelolipoma measures 4.3 x 3.5 x 3.3 cm today versus 4.0 x 3.6 x 3.2 cm previously - left  angio myelolipoma measures 9 x 8 x 8 mm today versus 8 x 8 x 8 mm   3. Mixed incontinence  - Completed 12 sessions of PTNS.    4. Nocturia  - Sleeping with CPAP      Observations/Objective:         Patient appears non distressed and is answering questions appropriately.   Patient denies any gross hematuria, dysuria or suprapubic/flank pain.  Patient denies any fevers, chills, nausea or vomiting.     Assessment and Plan: Bilateral angiomyolipomas  -RUS notes possible increase in size of both AMLs -CT urogram notes stable angiomyolipomas    2. Intermediate risk hematuria  - former smoker - Hematuria work-up completed in  04/2018.  - Findings positive for nodular area of cystitis cystica - no reports of gross heme -UA with micro heme on last UA w/ negative culture    3. Mixed incontinence  - She has had symptomatic improvement  - She is not interested in any medications at this time    4. Nocturia  - Sleeping with CPAP machine  Follow Up Instructions:   I discussed the assessment and treatment plan with the patient. The patient was provided an opportunity to ask questions and all were answered. The patient agreed with the plan and demonstrated an understanding of the instructions.   The patient was advised to call back or seek an in-person evaluation if the symptoms worsen or if the condition fails to improve as anticipated.  I provided 20 minutes of non-face-to-face time during this encounter.   Pharoah Goggins, PA-C

## 2022-09-24 ENCOUNTER — Telehealth: Payer: Commercial Managed Care - HMO | Admitting: Urology

## 2022-09-30 ENCOUNTER — Ambulatory Visit (INDEPENDENT_AMBULATORY_CARE_PROVIDER_SITE_OTHER): Payer: Commercial Managed Care - HMO | Admitting: Psychology

## 2022-09-30 DIAGNOSIS — F309 Manic episode, unspecified: Secondary | ICD-10-CM | POA: Diagnosis not present

## 2022-09-30 NOTE — Progress Notes (Signed)
[facility] Counselor/Therapist Progress Note  Patient ID: Lori Knight, MRN: [id number],    Date: 09/30/2022  Time Spent: 3:02 Pm -  3:45 pm:  43 minutes  Treatment Type: Individual Therapy.  Reported Symptoms: Anxiety   Mental Status Exam: Appearance:  Neat and Well Groomed     Behavior: Appropriate  Motor: Normal  Speech/Language:  Clear and Coherent and Normal Rate  Affect: Congruent  Mood: normal  Thought process: normal  Thought content:   WNL  Sensory/Perceptual disturbances:   WNL  Orientation: oriented to person, place, time/date, and situation  Attention: Good  Concentration: Good  Memory: Lori Knight of knowledge:  Good  Insight:   Good  Judgment:  Good  Impulse Control: Good   Subjective:   Lori Knight participated from home, via video and consented to treatment. Therapist participated from home office. We met online due to [place] pandemic. She noted her daughter being "really depressed". She noted concern regarding her daughter's mood and general functioning. We explored this and her attempts to provide support.  Also noted the need for positive change in her daughter's life and described her daughter's functioning currently.  She noted her daughter often does not clean up after herself or address her own needs reliably.  Therapist provided resources for additional treatment and we worked on identifying ways to communicate concerns positively and assertively.  Therapist encouraged Lori Knight to identify ways to provide positive support structure for her daughter as she diversus her mood.  Psychoeducation regarding treatment was provided.  Therapist encouraged Lori Knight to manage her worry proactively as she works on addressing this issue.  Therapist validated and normalized Lori Knight feelings and experience and provided supportive therapy follow-up was scheduled for continued treatment, which probably benefits from.  Interventions: Interpersonal  Diagnosis:  Bipolar I disorder, single manic episode ([place])  Plan: Patient is to use CBT, ACT, BA, mindfulness, and coping skills to help manage decrease symptoms associated with their diagnosis. (Target Date 03/16/23)   Long-term goal:   Reduce overall level, frequency, and intensity of the feelings of depression and anxiety evidenced by negative self-talk, rumination, and poor self-esteem  from 6 to 7 days/week to 0 to 1 days/week per client report for at least 3 consecutive months. Engage in consistent self-care and mindfulness.   Short-term goal:  Verbally express understanding of the relationship between feelings of depression, bipolar, anxiety and their impact on thinking patterns and behaviors.  Verbalize an understanding of the role that distorted thinking plays in creating fears, excessive worry, and ruminations.   Lori Knight participated in the creation of the treatment plan)  Lori Irish, LCSW

## 2022-10-06 ENCOUNTER — Ambulatory Visit: Payer: Commercial Managed Care - HMO | Admitting: Nurse Practitioner

## 2022-10-06 DIAGNOSIS — E559 Vitamin D deficiency, unspecified: Secondary | ICD-10-CM

## 2022-10-06 DIAGNOSIS — I700 Atherosclerosis of aorta: Secondary | ICD-10-CM

## 2022-10-06 DIAGNOSIS — E1169 Type 2 diabetes mellitus with other specified complication: Secondary | ICD-10-CM

## 2022-10-06 DIAGNOSIS — E063 Autoimmune thyroiditis: Secondary | ICD-10-CM

## 2022-10-06 DIAGNOSIS — E785 Hyperlipidemia, unspecified: Secondary | ICD-10-CM

## 2022-10-06 DIAGNOSIS — K219 Gastro-esophageal reflux disease without esophagitis: Secondary | ICD-10-CM

## 2022-10-06 DIAGNOSIS — D329 Benign neoplasm of meninges, unspecified: Secondary | ICD-10-CM

## 2022-10-06 DIAGNOSIS — F309 Manic episode, unspecified: Secondary | ICD-10-CM

## 2022-10-06 DIAGNOSIS — G4733 Obstructive sleep apnea (adult) (pediatric): Secondary | ICD-10-CM

## 2022-10-06 DIAGNOSIS — E1129 Type 2 diabetes mellitus with other diabetic kidney complication: Secondary | ICD-10-CM

## 2022-10-06 DIAGNOSIS — I152 Hypertension secondary to endocrine disorders: Secondary | ICD-10-CM

## 2022-10-07 ENCOUNTER — Encounter: Payer: Self-pay | Admitting: Nurse Practitioner

## 2022-10-12 ENCOUNTER — Encounter: Payer: Self-pay | Admitting: Nurse Practitioner

## 2022-10-15 ENCOUNTER — Other Ambulatory Visit: Payer: Commercial Managed Care - HMO | Admitting: Urology

## 2022-10-19 ENCOUNTER — Other Ambulatory Visit: Payer: Commercial Managed Care - HMO | Admitting: Urology

## 2022-10-22 ENCOUNTER — Ambulatory Visit: Payer: Commercial Managed Care - HMO | Admitting: Nurse Practitioner

## 2022-10-22 ENCOUNTER — Encounter: Payer: Self-pay | Admitting: Nurse Practitioner

## 2022-10-22 DIAGNOSIS — I700 Atherosclerosis of aorta: Secondary | ICD-10-CM

## 2022-10-22 DIAGNOSIS — K219 Gastro-esophageal reflux disease without esophagitis: Secondary | ICD-10-CM

## 2022-10-22 DIAGNOSIS — Z23 Encounter for immunization: Secondary | ICD-10-CM

## 2022-10-22 DIAGNOSIS — E063 Autoimmune thyroiditis: Secondary | ICD-10-CM

## 2022-10-22 DIAGNOSIS — E1169 Type 2 diabetes mellitus with other specified complication: Secondary | ICD-10-CM

## 2022-10-22 DIAGNOSIS — E559 Vitamin D deficiency, unspecified: Secondary | ICD-10-CM

## 2022-10-22 DIAGNOSIS — E1129 Type 2 diabetes mellitus with other diabetic kidney complication: Secondary | ICD-10-CM

## 2022-10-22 DIAGNOSIS — E1159 Type 2 diabetes mellitus with other circulatory complications: Secondary | ICD-10-CM

## 2022-10-22 DIAGNOSIS — G4733 Obstructive sleep apnea (adult) (pediatric): Secondary | ICD-10-CM

## 2022-10-22 DIAGNOSIS — D329 Benign neoplasm of meninges, unspecified: Secondary | ICD-10-CM

## 2022-10-22 DIAGNOSIS — F309 Manic episode, unspecified: Secondary | ICD-10-CM

## 2022-10-27 ENCOUNTER — Ambulatory Visit: Payer: Commercial Managed Care - HMO | Admitting: Urology

## 2022-10-27 VITALS — BP 138/85 | HR 75 | Ht 66.0 in | Wt 264.1 lb

## 2022-10-27 DIAGNOSIS — D1771 Benign lipomatous neoplasm of kidney: Secondary | ICD-10-CM

## 2022-10-27 DIAGNOSIS — R3121 Asymptomatic microscopic hematuria: Secondary | ICD-10-CM

## 2022-10-27 LAB — MICROSCOPIC EXAMINATION: Epithelial Cells (non renal): >10 /hpf — AB (ref 0–10)

## 2022-10-27 LAB — URINALYSIS, COMPLETE
Bilirubin, UA: NEGATIVE
Glucose, UA: NEGATIVE
Ketones, UA: NEGATIVE
Leukocytes,UA: NEGATIVE
Nitrite, UA: NEGATIVE
RBC, UA: NEGATIVE
Specific Gravity, UA: 1.025 (ref 1.005–1.030)
Urobilinogen, Ur: 0.2 mg/dL (ref 0.2–1.0)
pH, UA: 5.5 (ref 5.0–7.5)

## 2022-10-27 NOTE — Progress Notes (Signed)
   10/27/22  CC:  Chief Complaint  Patient presents with   Cysto    HPI: 52 year old female who presents today for cystoscopy.  She has a personal history of intermediate risk microscopic hematuria, bilateral angiomyolipomas and mixed urinary incontinence.  She underwent a negative renal ultrasound on 09/10/2022 for any pathology other than known bilateral angiomyolipomas.  CT scan was ordered but this has not been completed.  Blood pressure 138/85, pulse 75, height  (1.676 m), weight 264 lb 2 oz (119.8 kg). NED. A&Ox3.   No respiratory distress   Abd soft, NT, ND Normal external genitalia with patent urethral meatus  Cystoscopy Procedure Note  Patient identification was confirmed, informed consent was obtained, and patient was prepped using Betadine solution.  Lidocaine jelly was administered per urethral meatus.    Procedure: - Flexible cystoscope introduced, without any difficulty.   - Thorough search of the bladder revealed:    normal urethral meatus    normal urothelium with notable stellate scar left dome    no stones    no ulcers     no tumors    no urethral polyps    no trabeculation  - Ureteral orifices were normal in position and appearance.  Post-Procedure: - Patient tolerated the procedure well  Assessment/ Plan:  1. Asymptomatic microscopic hematuria Testing today was unremarkable  Recent renal ultrasound unremarkable other than AML - Urinalysis, Complete  2. Angiomyolipoma of both kidneys Potentially enlarging, CT scan ordered but not completed  I think is reasonable to wait for next year and repeat imaging, suspect size discrepancy may be related to imaging modality variation    F/u shannon in 1 year  Vanna Scotland, MD

## 2022-10-29 ENCOUNTER — Ambulatory Visit (INDEPENDENT_AMBULATORY_CARE_PROVIDER_SITE_OTHER): Payer: Commercial Managed Care - HMO | Admitting: Psychology

## 2022-10-29 DIAGNOSIS — F309 Manic episode, unspecified: Secondary | ICD-10-CM

## 2022-10-29 NOTE — Progress Notes (Signed)
[facility] Counselor/Therapist Progress Note  Patient ID: Lori Knight, MRN: [id number],    Date: 10/29/2022  Time Spent: 10:03 AM -  10:44 AM:  41 minutes  Treatment Type: Individual Therapy.  Reported Symptoms: Anxiety   Mental Status Exam: Appearance:  Neat and Well Groomed     Behavior: Appropriate  Motor: Normal  Speech/Language:  Clear and Coherent and Normal Rate  Affect: Congruent  Mood: normal  Thought process: normal  Thought content:   WNL  Sensory/Perceptual disturbances:   WNL  Orientation: oriented to person, place, time/date, and situation  Attention: Good  Concentration: Good  Memory: WNL  Fund of knowledge:  Good  Insight:   Good  Judgment:  Good  Impulse Control: Good   Subjective:   Lori Knight participated from home, via video and consented to treatment. Therapist participated from home office. We met online due to COVID pandemic. 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 She noted working on relaxing and disconnecting from her phone for a bit of time. She noted feelings of guilt when taking a break but noted her home/tasks are never complete and working to engage in self-care. She noted a need for boundaries regarding her phone in relation to voice mails and texts for work after hours. We worked on identifying ways to address this via boundaries for self, disabling nighttime notification, and managing her own worry regarding calls and working on dealing with the discomfort of not checking the phone. She noted some improvement in her daughter'Lori mental health. Lori Knight noted not feeling good but noted her medical providers being unable to identify any concerns. She noted her efforts to eat healthier but noted a need to increase her exercise. She noted a need to be more mindful of her need for exercise. We worked on identifying ways to increase exercise and discussed adding more steps towards  her daily routine. She noted barriers to this and we worked on identifying boundaries, relatedly, she noted barriers that feel overwhelming often result in discontinuing of problem solving when it is perceived as timely or too in depth. Therapist encouraged Emilene to work on distress tolerance, work through additional investigation regarding the task prior to determining feelings regarding it'Lori difficulty, and pacing self. Therapist modeled this during the session.   Therapist validated and normalized [name]'Lori feelings and experience and provided supportive therapy.  Interventions: Cognitive Behavioral Therapy  Diagnosis: Bipolar I disorder, single manic episode  Plan: Patient is to use CBT, ACT, BA, mindfulness, and coping skills to help manage decrease symptoms associated with their diagnosis. (Target Date 03/16/23)   Long-term goal:   Reduce overall level, frequency, and intensity of the feelings of depression and anxiety evidenced by negative self-talk, rumination, and poor self-esteem  from 6 to 7 days/week to 0 to 1 days/week per client report for at least 3 consecutive months. Engage in consistent self-care and mindfulness.   Short-term goal:  Verbally express understanding of the relationship between feelings of depression, bipolar, anxiety and their impact on thinking patterns and behaviors.  Verbalize an understanding of the role that distorted thinking plays in creating fears, excessive worry, and ruminations.   Lori Knight participated in the creation of the treatment plan)  Delight Ovens, LCSW

## 2022-11-05 ENCOUNTER — Other Ambulatory Visit: Payer: Self-pay | Admitting: Nurse Practitioner

## 2022-11-05 NOTE — Telephone Encounter (Signed)
Requested medication (s) are due for refill today: yes  Requested medication (s) are on the active medication list: yes  Last refill:  08/03/22  Future visit scheduled: yes  Notes to clinic:   Medication not assigned to a protocol, review manually.      Requested Prescriptions  Pending Prescriptions Disp Refills   MOUNJARO 7.5 MG/0.5ML Pen [Pharmacy Med Name: MOUNJARO 7.5 MG/0.5 ML PEN]  2    Sig: INJECT 7.5 MG SUBCUTANEOUSLY WEEKLY     Off-Protocol Failed - 11/05/2022  1:55 AM      Failed - Medication not assigned to a protocol, review manually.      Passed - Valid encounter within last 12 months    Recent Outpatient Visits           3 months ago Type 2 diabetes mellitus with morbid obesity (HCC)   [place] [facility] [place], Menno T, NP   4 months ago Type 2 diabetes mellitus with proteinuria (HCC)   [place] [facility] [place], Kinloch T, NP   8 months ago Type 2 diabetes mellitus with proteinuria (HCC)   [place] [facility] [place], Dorie Rank, NP   9 months ago Memory changes   [place] [facility] [place], Corrie Dandy T, NP   11 months ago Chronic RLQ pain   [place] [facility] [place], Dorie Rank, NP       Future Appointments             In 6 days Marjie Skiff, NP [place] [facility]   In 2 weeks Deirdre Evener, MD [facility]   In 11 months [place], Elana Alm [facility]

## 2022-11-11 ENCOUNTER — Encounter: Payer: Self-pay | Admitting: Nurse Practitioner

## 2022-11-11 ENCOUNTER — Ambulatory Visit: Payer: Commercial Managed Care - HMO | Admitting: Nurse Practitioner

## 2022-11-11 DIAGNOSIS — F309 Manic episode, unspecified: Secondary | ICD-10-CM

## 2022-11-11 DIAGNOSIS — R809 Proteinuria, unspecified: Secondary | ICD-10-CM

## 2022-11-11 DIAGNOSIS — I152 Hypertension secondary to endocrine disorders: Secondary | ICD-10-CM

## 2022-11-11 DIAGNOSIS — E1169 Type 2 diabetes mellitus with other specified complication: Secondary | ICD-10-CM

## 2022-11-11 DIAGNOSIS — I700 Atherosclerosis of aorta: Secondary | ICD-10-CM

## 2022-11-11 DIAGNOSIS — E063 Autoimmune thyroiditis: Secondary | ICD-10-CM

## 2022-11-11 DIAGNOSIS — K219 Gastro-esophageal reflux disease without esophagitis: Secondary | ICD-10-CM

## 2022-11-11 DIAGNOSIS — G4733 Obstructive sleep apnea (adult) (pediatric): Secondary | ICD-10-CM

## 2022-11-11 DIAGNOSIS — E559 Vitamin D deficiency, unspecified: Secondary | ICD-10-CM

## 2022-11-12 ENCOUNTER — Ambulatory Visit: Payer: Commercial Managed Care - HMO | Admitting: Psychology

## 2022-11-19 ENCOUNTER — Ambulatory Visit: Payer: Commercial Managed Care - HMO | Admitting: Psychology

## 2022-11-19 ENCOUNTER — Ambulatory Visit (INDEPENDENT_AMBULATORY_CARE_PROVIDER_SITE_OTHER): Payer: Commercial Managed Care - HMO | Admitting: Psychology

## 2022-11-19 DIAGNOSIS — F309 Manic episode, unspecified: Secondary | ICD-10-CM

## 2022-11-19 NOTE — Progress Notes (Signed)
[facility] Counselor/Therapist Progress Note  Patient ID: Lori Knight, MRN: [id number],    Date: 11/19/2022  Time Spent: 2:05 PM -  2:45 PM:  40 minutes  Treatment Type: Individual Therapy.  Reported Symptoms: Anxiety   Mental Status Exam: Appearance:  Neat and Well Groomed     Behavior: Appropriate  Motor: Normal  Speech/Language:  Clear and Coherent and Normal Rate  Affect: Congruent  Mood: normal  Thought process: normal  Thought content:   WNL  Sensory/Perceptual disturbances:   WNL  Orientation: oriented to person, place, time/date, and situation  Attention: Good  Concentration: Good  Memory: WNL  Fund of knowledge:  Good  Insight:   Good  Judgment:  Good  Impulse Control: Good   Subjective:   Lori Knight participated from home, via video and consented to treatment. Therapist participated from home office. We met online due to COVID pandemic. 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 She noted being more busy at work and noted feeling tired as a result. She noted some positives occurring at work, as well. She noted her upcoming nuptials and noted excitement about the event. She noted the occasion being exciting. She noted an upcoming trip and discussed feeling anxious as work gets more busy due to having spotty phone service during the trip.She noted some worry about finances due to her soon to be husband being currently out of work. We worked on processing her worry and ways to communicate feelings to Lori Knight.  Therapist validated and normalized [name]'s feelings and experience and provided supportive therapy.  Interventions: Interpersonal  Diagnosis: Bipolar I disorder, single manic episode (HCC)  Plan: Patient is to use CBT, ACT, BA, mindfulness, and coping skills to help manage decrease symptoms associated with their diagnosis. (Target Date 03/16/23)   Long-term  goal:   Reduce overall level, frequency, and intensity of the feelings of depression and anxiety evidenced by negative self-talk, rumination, and poor self-esteem  from 6 to 7 days/week to 0 to 1 days/week per client report for at least 3 consecutive months. Engage in consistent self-care and mindfulness.   Short-term goal:  Verbally express understanding of the relationship between feelings of depression, bipolar, anxiety and their impact on thinking patterns and behaviors.  Verbalize an understanding of the role that distorted thinking plays in creating fears, excessive worry, and ruminations.   Lori Knight participated in the creation of the treatment plan)  Delight Ovens, LCSW

## 2022-11-25 ENCOUNTER — Ambulatory Visit: Payer: Commercial Managed Care - HMO | Admitting: Dermatology

## 2022-11-25 ENCOUNTER — Encounter: Payer: Self-pay | Admitting: Dermatology

## 2022-11-25 VITALS — BP 146/86 | HR 69

## 2022-11-25 DIAGNOSIS — L918 Other hypertrophic disorders of the skin: Secondary | ICD-10-CM

## 2022-11-25 DIAGNOSIS — L578 Other skin changes due to chronic exposure to nonionizing radiation: Secondary | ICD-10-CM | POA: Diagnosis not present

## 2022-11-25 DIAGNOSIS — D492 Neoplasm of unspecified behavior of bone, soft tissue, and skin: Secondary | ICD-10-CM

## 2022-11-25 DIAGNOSIS — L821 Other seborrheic keratosis: Secondary | ICD-10-CM

## 2022-11-25 DIAGNOSIS — L820 Inflamed seborrheic keratosis: Secondary | ICD-10-CM

## 2022-11-25 DIAGNOSIS — L811 Chloasma: Secondary | ICD-10-CM

## 2022-11-25 DIAGNOSIS — Z1283 Encounter for screening for malignant neoplasm of skin: Secondary | ICD-10-CM | POA: Diagnosis not present

## 2022-11-25 DIAGNOSIS — L719 Rosacea, unspecified: Secondary | ICD-10-CM

## 2022-11-25 DIAGNOSIS — D1801 Hemangioma of skin and subcutaneous tissue: Secondary | ICD-10-CM

## 2022-11-25 DIAGNOSIS — X32XXXA Exposure to sunlight, initial encounter: Secondary | ICD-10-CM

## 2022-11-25 DIAGNOSIS — D229 Melanocytic nevi, unspecified: Secondary | ICD-10-CM

## 2022-11-25 DIAGNOSIS — W908XXA Exposure to other nonionizing radiation, initial encounter: Secondary | ICD-10-CM

## 2022-11-25 DIAGNOSIS — Z7189 Other specified counseling: Secondary | ICD-10-CM

## 2022-11-25 DIAGNOSIS — L814 Other melanin hyperpigmentation: Secondary | ICD-10-CM

## 2022-11-25 DIAGNOSIS — Z86018 Personal history of other benign neoplasm: Secondary | ICD-10-CM

## 2022-11-25 NOTE — Progress Notes (Unsigned)
New Pt Visit   Subjective  Lori Knight is a 52 y.o. female who presents for the following: Skin Cancer Screening and Full Body Skin Exam The patient presents for Total-Body Skin Exam (TBSE) for skin cancer screening and mole check. The patient has spots, moles and lesions to be evaluated, some may be new or changing and the patient has concerns that these could be cancer.  The following portions of the chart were reviewed this encounter and updated as appropriate: medications, allergies, medical history  Review of Systems:  No other skin or systemic complaints except as noted in HPI or Assessment and Plan.  Objective  Well appearing patient in no apparent distress; mood and affect are within normal limits.  A full examination was performed including scalp, head, eyes, ears, nose, lips, neck, chest, axillae, abdomen, back, buttocks, bilateral upper extremities, bilateral lower extremities, hands, feet, fingers, toes, fingernails, and toenails. All findings within normal limits unless otherwise noted below.   Relevant physical exam findings are noted in the Assessment and Plan.  right side at braline 1.1 cm flesh papule        face x Stuck-on, waxy, tan-brown papules--Discussed benign etiology and prognosis.    Assessment & Plan   LENTIGINES, SEBORRHEIC KERATOSES, HEMANGIOMAS - Benign normal skin lesions - Benign-appearing - Call for any changes  MELANOCYTIC NEVI - Tan-brown and/or pink-flesh-colored symmetric macules and papules - Benign appearing on exam today - Observation - Call clinic for new or changing moles - Recommend daily use of broad spectrum spf 30+ sunscreen to sun-exposed areas.   ACTINIC DAMAGE - Chronic condition, secondary to cumulative UV/sun exposure - diffuse scaly erythematous macules with underlying dyspigmentation - Recommend daily broad spectrum sunscreen SPF 30+ to sun-exposed areas, reapply every 2 hours as needed.  - Staying in the shade  or wearing long sleeves, sun glasses (UVA+UVB protection) and wide brim hats (4-inch brim around the entire circumference of the hat) are also recommended for sun protection.  - Call for new or changing lesions.  ROSACEA Exam Mid face erythema with telangiectasias-face  Chronic and persistent condition with duration or expected duration over one year. Condition is symptomatic/ bothersome to patient. Not currently at goal.  Rosacea is a chronic progressive skin condition usually affecting the face of adults, causing redness and/or acne bumps. It is treatable but not curable. It sometimes affects the eyes (ocular rosacea) as well. It may respond to topical and/or systemic medication and can flare with stress, sun exposure, alcohol, exercise, topical steroids (including hydrocortisone/cortisone 10) and some foods.  Daily application of broad spectrum spf 30+ sunscreen to face is recommended to reduce flares.  Patient declines treatment    MELASMA Exam: reticulated hyperpigmented patches at face  Chronic and persistent condition with duration or expected duration over one year. Condition is symptomatic/ bothersome to patient. Not currently at goal.   Melasma is a chronic; persistent condition of hyperpigmented patches generally on the face, worse in summer due to higher UV exposure.    Heredity; thyroid disease; sun exposure; pregnancy; birth control pills; epilepsy medication and darker skin may predispose to Melasma.   Recommendations include: - Sun avoidance and daily broad spectrum (UVA/UVB) tinted mineral sunscreen SPF 30+, with Zinc or Titanium Dioxide. - Rx topical bleaching creams (i.e. hydroquinone) is a common treatment but should not be used long term.  Hydroquinones may be mixed with retinoids; vitamin C; steroids; Kojic Acid. - Alastin A-luminate, retinoids, vitamin C, topical tranexamic acid, glycolic acid and kojic  acid can be used for brightening while on break from hydroquinone -  Rx Azelaic Acid is also a treatment option that is safe for pregnancy (Category B). - OTC Heliocare can be helpful in control and prevention. - Oral Rx with Tranexamic Acid 250 mg - 650 mg po daily can be used for moderate to severe cases especially during summer (contraindications include pregnancy; lactation; hx of PE; hx of DVT; clotting disorder; heart disease; anticoagulant use and upcoming long trips)   - Chemical peels (would need multiple for best result).  - Lasers and  Microdermabrasion may also be helpful adjunct treatments.  Patient decline treatment    HISTORY OF DYSPLASTIC NEVUS No evidence of recurrence today Recommend regular full body skin exams Recommend daily broad spectrum sunscreen SPF 30+ to sun-exposed areas, reapply every 2 hours as needed.  Call if any new or changing lesions are noted between office visits   SKIN CANCER SCREENING PERFORMED TODAY.  Neoplasm of skin right side at braline  Epidermal / dermal shaving  Lesion diameter (cm):  1.1 Informed consent: discussed and consent obtained   Timeout: patient name, date of birth, surgical site, and procedure verified   Procedure prep:  Patient was prepped and draped in usual sterile fashion Prep type:  Isopropyl alcohol Anesthesia: the lesion was anesthetized in a standard fashion   Anesthetic:  1% lidocaine w/ epinephrine 1-100,000 buffered w/ 8.4% NaHCO3 Hemostasis achieved with: pressure, aluminum chloride and electrodesiccation   Outcome: patient tolerated procedure well   Post-procedure details: sterile dressing applied and wound care instructions given   Dressing type: bandage and petrolatum    Specimen 1 - Surgical pathology Differential Diagnosis: R/O irritated nevus vs Dysplastic nevus   Check Margins: No  Inflamed seborrheic keratosis face x  Symptomatic, irritating, patient would like treated.   Destruction of lesion - face x Complexity: simple   Destruction method: cryotherapy    Informed consent: discussed and consent obtained   Timeout:  patient name, date of birth, surgical site, and procedure verified Lesion destroyed using liquid nitrogen: Yes   Region frozen until ice ball extended beyond lesion: Yes   Outcome: patient tolerated procedure well with no complications   Post-procedure details: wound care instructions given    Actinic skin damage  Skin cancer screening  Rosacea  Counseling and coordination of care  Melasma  Lentigo  Melanocytic nevus, unspecified location  History of dysplastic nevus   Return in about 1 year (around 11/25/2023) for TBSE, hx of Dysplastic nevus .  IAngelique Holm, CMA, am acting as scribe for Armida Sans, MD .   Documentation: I have reviewed the above documentation for accuracy and completeness, and I agree with the above.  Armida Sans, MD

## 2022-11-25 NOTE — Patient Instructions (Addendum)
Wound Care Instructions  Cleanse wound gently with soap and water once a day then pat dry with clean gauze. Apply a thin coat of Petrolatum (petroleum jelly, "Vaseline") over the wound (unless you have an allergy to this). We recommend that you use a new, sterile tube of Vaseline. Do not pick or remove scabs. Do not remove the yellow or white "healing tissue" from the base of the wound.  Cover the wound with fresh, clean, nonstick gauze and secure with paper tape. You may use Band-Aids in place of gauze and tape if the wound is small enough, but would recommend trimming much of the tape off as there is often too much. Sometimes Band-Aids can irritate the skin.  You should call the office for your biopsy report after 1 week if you have not already been contacted.  If you experience any problems, such as abnormal amounts of bleeding, swelling, significant bruising, significant pain, or evidence of infection, please call the office immediately.  FOR ADULT SURGERY PATIENTS: If you need something for pain relief you may take 1 extra strength Tylenol (acetaminophen) AND 2 Ibuprofen (200mg each) together every 4 hours as needed for pain. (do not take these if you are allergic to them or if you have a reason you should not take them.) Typically, you may only need pain medication for 1 to 3 days.     Cryotherapy Aftercare  Wash gently with soap and water everyday.   Apply Vaseline and Band-Aid daily until healed.   Recommend daily broad spectrum sunscreen SPF 30+ to sun-exposed areas, reapply every 2 hours as needed. Call for new or changing lesions.  Staying in the shade or wearing long sleeves, sun glasses (UVA+UVB protection) and wide brim hats (4-inch brim around the entire circumference of the hat) are also recommended for sun protection.     Due to recent changes in healthcare laws, you may see results of your pathology and/or laboratory studies on MyChart before the doctors have had a chance to  review them. We understand that in some cases there may be results that are confusing or concerning to you. Please understand that not all results are received at the same time and often the doctors may need to interpret multiple results in order to provide you with the best plan of care or course of treatment. Therefore, we ask that you please give us 2 business days to thoroughly review all your results before contacting the office for clarification. Should we see a critical lab result, you will be contacted sooner.   If You Need Anything After Your Visit  If you have any questions or concerns for your doctor, please call our main line at [phone] and press option 4 to reach your doctor's medical assistant. If no one answers, please leave a voicemail as directed and we will return your call as soon as possible. Messages left after 4 pm will be answered the following business day.   You may also send us a message via MyChart. We typically respond to MyChart messages within 1-2 business days.  For prescription refills, please ask your pharmacy to contact our office. Our fax number is [phone].  If you have an urgent issue when the clinic is closed that cannot wait until the next business day, you can page your doctor at the number below.    Please note that while we do our best to be available for urgent issues outside of office hours, we are not available 24/7.     If you have an urgent issue and are unable to reach us, you may choose to seek medical care at your doctor's office, retail clinic, urgent care center, or emergency room.  If you have a medical emergency, please immediately call 911 or go to the emergency department.  Pager Numbers  - Dr. Kowalski: [phone]  - Dr. Moye: [phone]  - Dr. Stewart: [phone]  In the event of inclement weather, please call our main line at [phone] for an update on the status of any delays or closures.  Dermatology Medication  Tips: Please keep the boxes that topical medications come in in order to help keep track of the instructions about where and how to use these. Pharmacies typically print the medication instructions only on the boxes and not directly on the medication tubes.   If your medication is too expensive, please contact our office at [phone] option 4 or send us a message through MyChart.   We are unable to tell what your co-pay for medications will be in advance as this is different depending on your insurance coverage. However, we may be able to find a substitute medication at lower cost or fill out paperwork to get insurance to cover a needed medication.   If a prior authorization is required to get your medication covered by your insurance company, please allow us 1-2 business days to complete this process.  Drug prices often vary depending on where the prescription is filled and some pharmacies may offer cheaper prices.  The website www.goodrx.com contains coupons for medications through different pharmacies. The prices here do not account for what the cost may be with help from insurance (it may be cheaper with your insurance), but the website can give you the price if you did not use any insurance.  - You can print the associated coupon and take it with your prescription to the pharmacy.  - You may also stop by our office during regular business hours and pick up a GoodRx coupon card.  - If you need your prescription sent electronically to a different pharmacy, notify our office through [facility] MyChart or by phone at [phone] option 4.     Si Usted Necesita Algo Despus de Su Visita  Tambin puede enviarnos un mensaje a travs de MyChart. Por lo general respondemos a los mensajes de MyChart en el transcurso de 1 a 2 das hbiles.  Para renovar recetas, por favor pida a su farmacia que se ponga en contacto con nuestra oficina. Nuestro nmero de fax es el [phone].  Si tiene un  asunto urgente cuando la clnica est cerrada y que no puede esperar hasta el siguiente da hbil, puede llamar/localizar a su doctor(a) al nmero que aparece a continuacin.   Por favor, tenga en cuenta que aunque hacemos todo lo posible para estar disponibles para asuntos urgentes fuera del horario de oficina, no estamos disponibles las 24 horas del da, los 7 das de la semana.   Si tiene un problema urgente y no puede comunicarse con nosotros, puede optar por buscar atencin mdica  en el consultorio de su doctor(a), en una clnica privada, en un centro de atencin urgente o en una sala de emergencias.  Si tiene una emergencia mdica, por favor llame inmediatamente al 911 o vaya a la sala de emergencias.  Nmeros de bper  - Dr. Kowalski: [phone]  - Dra. Moye: [phone]  - Dra. Stewart: [phone]  En caso de inclemencias del tiempo, por favor llame a nuestra lnea principal   al [phone] para una actualizacin sobre el estado de cualquier retraso o cierre.  Consejos para la medicacin en dermatologa: Por favor, guarde las cajas en las que vienen los medicamentos de uso tpico para ayudarle a seguir las instrucciones sobre dnde y cmo usarlos. Las farmacias generalmente imprimen las instrucciones del medicamento slo en las cajas y no directamente en los tubos del medicamento.   Si su medicamento es muy caro, por favor, pngase en contacto con nuestra oficina llamando al [phone] y presione la opcin 4 o envenos un mensaje a travs de MyChart.   No podemos decirle cul ser su copago por los medicamentos por adelantado ya que esto es diferente dependiendo de la cobertura de su seguro. Sin embargo, es posible que podamos encontrar un medicamento sustituto a menor costo o llenar un formulario para que el seguro cubra el medicamento que se considera necesario.   Si se requiere una autorizacin previa para que su compaa de seguros cubra su medicamento, por favor  permtanos de 1 a 2 das hbiles para completar este proceso.  Los precios de los medicamentos varan con frecuencia dependiendo del lugar de dnde se surte la receta y alguna farmacias pueden ofrecer precios ms baratos.  El sitio web www.goodrx.com tiene cupones para medicamentos de diferentes farmacias. Los precios aqu no tienen en cuenta lo que podra costar con la ayuda del seguro (puede ser ms barato con su seguro), pero el sitio web puede darle el precio si no utiliz ningn seguro.  - Puede imprimir el cupn correspondiente y llevarlo con su receta a la farmacia.  - Tambin puede pasar por nuestra oficina durante el horario de atencin regular y recoger una tarjeta de cupones de GoodRx.  - Si necesita que su receta se enve electrnicamente a una farmacia diferente, informe a nuestra oficina a travs de MyChart de [facility] o por telfono llamando al [phone] y presione la opcin 4.  

## 2022-11-28 NOTE — Patient Instructions (Signed)
Be Involved in Your Health Care:  Taking Medications When medications are taken as directed, they can greatly improve your health. But if they are not taken as instructed, they may not work. In some cases, not taking them correctly can be harmful. To help ensure your treatment remains effective and safe, understand your medications and how to take them.  Your lab results, notes and after visit summary will be available on My Chart. We strongly encourage you to use this feature. If lab results are abnormal the clinic will contact you with the appropriate steps. If the clinic does not contact you assume the results are satisfactory. You can always see your results on My Chart. If you have questions regarding your condition, please contact the clinic during office hours. You can also ask questions on My Chart.  We at [facility] are grateful that you chose [facility] to provide care. We strive to provide excellent and compassionate care and are always looking for feedback. If you get a survey from the clinic please complete this.   Diabetes Mellitus and Exercise Regular exercise is important for your health, especially if you have diabetes mellitus. Exercise is not just about losing weight. It can also help you increase muscle strength and bone density and reduce body fat and stress. This can help your level of endurance and make you more fit and flexible. Why should I exercise if I have diabetes? Exercise has many benefits for people with diabetes. It can: Help lower and control your blood sugar (glucose). Help your body respond better and become more sensitive to the hormone insulin. Reduce how much insulin your body needs. Lower your risk for heart disease by: Lowering how much "bad" cholesterol and triglycerides you have in your body. Increasing how much "good" cholesterol you have in your body. Lowering your blood pressure. Lowering your blood glucose levels. What is my activity  plan? Your health care provider or an expert trained in diabetes care (certified diabetes educator) can help you make an activity plan. This plan can help you find the type of exercise that works for you. It may also tell you how often to exercise and for how long. Be sure to: Get at least 150 minutes of medium-intensity or high-intensity exercise each week. This may involve brisk walking, biking, or water aerobics. Do stretching and strengthening exercises at least 2 times a week. This may involve yoga or weight lifting. Spread out your activity over at least 3 days of the week. Get some form of physical activity each day. Do not go more than 2 days in a row without some kind of activity. Avoid being inactive for more than 30 minutes at a time. Take frequent breaks to walk or stretch. Choose activities that you enjoy. Set goals that you know you can accomplish. Start slowly and increase the intensity of your exercise over time. How do I manage my diabetes during exercise?  Monitor your blood glucose Check your blood glucose before and after you exercise. If your blood glucose is 240 mg/dL (16.1 mmol/L) or higher before you exercise, check your urine for ketones. These are chemicals created by the liver. If you have ketones in your urine, do not exercise until your blood glucose returns to normal. If your blood glucose is 100 mg/dL (5.6 mmol/L) or lower, eat a snack that has 15-20 grams of carbohydrate in it. Check your blood glucose 15 minutes after the snack to make sure that your level is above 100 mg/dL (  5.6 mmol/L) before you start to exercise. Your risk for low blood glucose (hypoglycemia) goes up during and after exercise. Know the symptoms of this condition and how to treat it. Follow these instructions at home: Keep a carbohydrate snack on hand for use before, during, and after exercise. This can help prevent or treat hypoglycemia. Avoid injecting insulin into parts of your body that are  going to be used during exercise. This may include: Your arms, when you are going to play tennis. Your legs, when you are about to go jogging. Keep track of your exercise habits. This can help you and your health care provider watch and adjust your activity plan. Write down: What you eat before and after you exercise. Blood glucose levels before and after you exercise. The type and amount of exercise you do. Talk to your health care provider before you start a new activity. They may need to: Make sure that the activity is safe for you. Adjust your insulin, other medicines, and food that you eat. Drink water while you exercise. This can stop you from losing too much water (dehydration). It can also prevent problems caused by having a lot of heat in your body (heat stroke). Where to find more information American Diabetes Association: diabetes.org Association of Diabetes Care & Education Specialists: diabeteseducator.org This information is not intended to replace advice given to you by your health care provider. Make sure you discuss any questions you have with your health care provider. Document Revised: 12/16/2021 Document Reviewed: 12/16/2021 Elsevier Patient Education  2023 [employer].

## 2022-11-30 ENCOUNTER — Telehealth: Payer: Self-pay

## 2022-11-30 NOTE — Telephone Encounter (Signed)
Left voicemail to return my call

## 2022-11-30 NOTE — Telephone Encounter (Signed)
-----   Message from Deirdre Evener, MD sent at 11/30/2022 11:14 AM EDT ----- Diagnosis Skin , right side at braline [place], INFLAMED  Benign inflamed skin tag No further treatment needed

## 2022-12-02 ENCOUNTER — Telehealth: Payer: Self-pay

## 2022-12-02 ENCOUNTER — Ambulatory Visit (INDEPENDENT_AMBULATORY_CARE_PROVIDER_SITE_OTHER): Payer: Commercial Managed Care - HMO | Admitting: Nurse Practitioner

## 2022-12-02 ENCOUNTER — Encounter: Payer: Self-pay | Admitting: Nurse Practitioner

## 2022-12-02 VITALS — BP 128/84 | HR 70 | Temp 98.3°F | Ht 65.98 in | Wt 258.6 lb

## 2022-12-02 DIAGNOSIS — E1159 Type 2 diabetes mellitus with other circulatory complications: Secondary | ICD-10-CM

## 2022-12-02 DIAGNOSIS — E1129 Type 2 diabetes mellitus with other diabetic kidney complication: Secondary | ICD-10-CM | POA: Diagnosis not present

## 2022-12-02 DIAGNOSIS — E559 Vitamin D deficiency, unspecified: Secondary | ICD-10-CM

## 2022-12-02 DIAGNOSIS — E063 Autoimmune thyroiditis: Secondary | ICD-10-CM

## 2022-12-02 DIAGNOSIS — G8929 Other chronic pain: Secondary | ICD-10-CM

## 2022-12-02 DIAGNOSIS — E1169 Type 2 diabetes mellitus with other specified complication: Secondary | ICD-10-CM | POA: Diagnosis not present

## 2022-12-02 DIAGNOSIS — D329 Benign neoplasm of meninges, unspecified: Secondary | ICD-10-CM

## 2022-12-02 DIAGNOSIS — M25511 Pain in right shoulder: Secondary | ICD-10-CM | POA: Insufficient documentation

## 2022-12-02 DIAGNOSIS — F309 Manic episode, unspecified: Secondary | ICD-10-CM | POA: Diagnosis not present

## 2022-12-02 DIAGNOSIS — K219 Gastro-esophageal reflux disease without esophagitis: Secondary | ICD-10-CM

## 2022-12-02 DIAGNOSIS — G4733 Obstructive sleep apnea (adult) (pediatric): Secondary | ICD-10-CM

## 2022-12-02 DIAGNOSIS — I700 Atherosclerosis of aorta: Secondary | ICD-10-CM

## 2022-12-02 LAB — MICROALBUMIN, URINE WAIVED
Creatinine, Urine Waived: 200 mg/dL (ref 10–300)
Microalb, Ur Waived: 80 mg/L — ABNORMAL HIGH (ref 0–19)

## 2022-12-02 LAB — BAYER DCA HB A1C WAIVED: HB A1C (BAYER DCA - WAIVED): 6.9 % — ABNORMAL HIGH (ref 4.8–5.6)

## 2022-12-02 MED ORDER — AMLODIPINE BESYLATE 5 MG PO TABS: 5.0000 mg | ORAL_TABLET | Freq: Every day | ORAL | 4 refills | Status: DC

## 2022-12-02 MED ORDER — LOSARTAN POTASSIUM 50 MG PO TABS: 100.0000 mg | ORAL_TABLET | Freq: Every day | ORAL | 4 refills | Status: DC

## 2022-12-02 MED ORDER — OMEPRAZOLE 40 MG PO CPDR: DELAYED_RELEASE_CAPSULE | ORAL | 4 refills | Status: DC

## 2022-12-02 MED ORDER — TIRZEPATIDE 10 MG/0.5ML ~~LOC~~ SOAJ: 10.0000 mg | SUBCUTANEOUS | 12 refills | Status: DC

## 2022-12-02 NOTE — Assessment & Plan Note (Signed)
Past few months after fall, but is slowly improving.  We discussed ortho or PT referrals, but wishes to hold off at this time due to work schedule.  Recommend she perform PT exercises at home and discussed where she can find these.  Suspect more tendinitis at this time.  Continue Naproxen only as needed == recommend trial of Voltaren gel over the counter or Icy/Hot Lidocaine patches.  If ongoing or worsening then return to office and we will order PT.

## 2022-12-02 NOTE — Assessment & Plan Note (Signed)
Chronic, ongoing.  Initial BP slightly elevated due to recent espresso drink, but repeat trending down.  Continue current medication regimen and adjust as needed.  Focus on DASH diet and regular exercise at home.  Recommend she check BP at few days a week at home and document for provider.  Losartan for kidney protection, urine ALB 80 May 2024.  LABS: CMP and CBC.  Return in 3 months.

## 2022-12-02 NOTE — Assessment & Plan Note (Signed)
Chronic, ongoing.  Continue Omeprazole 40 MG daily and continue to collaborate with GI.  Mag level today. °

## 2022-12-02 NOTE — Telephone Encounter (Signed)
-----   Message from David C Kowalski, MD sent at 11/30/2022 11:14 AM EDT ----- Diagnosis Skin , right side at braline [place], INFLAMED  Benign inflamed skin tag No further treatment needed 

## 2022-12-02 NOTE — Assessment & Plan Note (Signed)
Chronic, stable with consistent use of CPAP.  Continue to use 100%. 

## 2022-12-02 NOTE — Assessment & Plan Note (Signed)
Chronic, stable.  Denies SI/HI.  Followed by psychiatry, will continue this collaboration and current medication regimen as prescribed by them.  Return in 3 months. 

## 2022-12-02 NOTE — Assessment & Plan Note (Signed)
Chronic, ongoing.  Will continue daily supplement and adjust as needed. Labs today.

## 2022-12-02 NOTE — Assessment & Plan Note (Signed)
Chronic, ongoing with A1c 6.9% today and urine ALB 80 (May 2024).  Continue Losartan for kidney protection.  Tolerating Mounjaro well with improved overall health and weight loss, will increase this to 10 MG weekly.  Will continue Metformin as ordered.  Educated on BS goals in morning and 2 hours after meals.   - On Statin and ARB - Eye and Foot exams up to date - Vaccinations up to date with exception of Shingrix and Td which she wishes to think about - Return in 3 months.

## 2022-12-02 NOTE — Assessment & Plan Note (Signed)
Followed by neurology, continue collaboration with neurology team and recent notes reviewed. °

## 2022-12-02 NOTE — Assessment & Plan Note (Signed)
Chronic.  Noted on imaging 03/17/2018.  Recommend continued use of statin daily and consider addition of ASA in future for prevention. 

## 2022-12-02 NOTE — Telephone Encounter (Signed)
Advised pt of bx results/sh ?

## 2022-12-02 NOTE — Assessment & Plan Note (Signed)
Chronic, ongoing.  Continue Levothyroxine 150 MCG daily.  Educated her on this.  Thyroid labs obtained today.

## 2022-12-02 NOTE — Assessment & Plan Note (Signed)
BMI 41.76 with T2DM, HTN/HLD, continues to have weight reduction with Mounjaro.  Recommended eating smaller high protein, low fat meals more frequently and exercising 30 mins a day 5 times a week with a goal of 10-15lb weight loss in the next 3 months. Patient voiced their understanding and motivation to adhere to these recommendations.

## 2022-12-02 NOTE — Progress Notes (Signed)
BP 128/84 (BP Location: Left Arm, Patient Position: Sitting, Cuff Size: Large)   Pulse 70   Temp 98.3 F (36.8 C) (Oral)   Ht 5' 5.98" (1.676 m)   Wt 258 lb 9.6 oz (117.3 kg)   SpO2 98%   BMI 41.76 kg/m    Subjective:    Patient ID: Lori Knight, female    DOB: 1970/10/05, 52 y.o.   MRN: [id number]  HPI: Lori Knight is a 52 y.o. female  Chief Complaint  Patient presents with   Diabetes   Hypertension   Hyperlipidemia   DIABETES A1c 6.9% December. Continues on Metformin 500 MG in morning and 500 MG at night and Mounjaro 7.5 MG weekly.  Has lost 10 pounds since January and notices reductions in appetite.  Continues on Vitamin D daily for history of low levels. Hypoglycemic episodes:no Polydipsia/polyuria: no Visual disturbance: no Chest pain: no Paresthesias: no Glucose Monitoring: no             Accucheck frequency: not checking             Fasting glucose:              Post prandial:              Evening:             Before meals: Taking Insulin?: no             Long acting insulin:             Short acting insulin: Blood Pressure Monitoring: not checking Retinal Examination: Up to Date -- [facility] [place] Foot Exam: Up to Date Pneumovax: Up to Date Influenza: Up to Date Aspirin: no   HYPERTENSION / HYPERLIPIDEMIA Continues on Losartan 100 MG, Amlodipine 5 MG and Rosuvastatin 40 MG QHS. Echo in 2019 noted EF 50-55%.  CPAP being used consistently at home. Aortic atherosclerosis noted on past imaging.   Follows with neurosurgery, Dr. Adriana Simas, for meningioma -- no changes made.  This remains stable on imaging -- to repeat in 2 years.  Saw Dr. Sherryll Burger, with neurology on 06/10/22 for memory changes --- completed speech therapy - she is to reschedule with neurology for follow-up. Duration of hypertension: chronic BP monitoring frequency: not checking BP range:  BP medication side effects: no Duration of hyperlipidemia: chronic Aspirin: no Recent  stressors: no Recurrent headaches: no Visual changes: no Palpitations: no Dyspnea: no Chest pain: no Lower extremity edema: no Dizzy/lightheaded: no  The ASCVD Risk score (Arnett DK, et al., 2019) failed to calculate for the following reasons:   The patient has a prior MI or stroke diagnosis   HYPOTHYROIDISM Continues Levothyroxine 150 MCG daily.  Continues Omeprazole daily for GERD. Thyroid control status:stable Satisfied with current treatment? yes Medication side effects: no Medication compliance: good compliance Etiology of hypothyroidism:  Recent dose adjustment: yes Fatigue: no Cold intolerance: no Heat intolerance: no Weight gain: no Weight loss: no Constipation: no Diarrhea/loose stools: no Palpitations: no Lower extremity edema: no Anxiety/depressed mood: no  SHOULDER PAIN (RIGHT) Fell in February -- going downstairs to get water, was holding onto stairs, caught at edge -- fell three stairs.  When fell grabbed with right arm and twisted shoulder back.  Pain is improving, but not 100%.   Duration: months Involved shoulder: right Mechanism of injury: trauma Location: posterior Onset:gradual Severity: 5/10  Quality:  dull and aching Frequency: intermittent Radiation: no Aggravating factors: movement  Alleviating factors:  Naproxen  Status:  slowly improving Treatments attempted: Naproxen as needed  Relief with NSAIDs?:  moderate Weakness: no Numbness: no Decreased grip strength: no Redness: no Swelling: no Bruising: no Fevers: no    BIPOLAR DISORDER Followed by psychiatry [facility]) and psychology -- currently taking Lamictal BID. Mood status: stable Satisfied with current treatment?: yes Symptom severity: moderate  Duration of current treatment : chronic Side effects: no Medication compliance: good compliance Psychotherapy/counseling: at present -- with Camila Li Depressed mood: no Anxious mood: no Anhedonia: no Significant weight  loss or gain: no Insomnia: no Fatigue: no Feelings of worthlessness or guilt: no Impaired concentration/indecisiveness: yes Suicidal ideations: no Hopelessness: no Crying spells: no    12/02/2022   11:10 AM 07/21/2022   10:49 AM 06/16/2022    9:53 AM 03/04/2022   11:04 AM 01/15/2022    3:37 PM  Depression screen PHQ 2/9  Decreased Interest 0 0 2 1 0  Down, Depressed, Hopeless 0 0 2 1 0  PHQ - 2 Score 0 0 4 2 0  Altered sleeping 0 0 0 0 1  Tired, decreased energy 1 1 1 3 2   Change in appetite 1 0 2 3 3   Feeling bad or failure about yourself  0 1 0 3 1  Trouble concentrating 1 3 3 3 2   Moving slowly or fidgety/restless 0 0 1 1 1   Suicidal thoughts 0 0 0 0 0  PHQ-9 Score 3 5 11 15 10   Difficult doing work/chores Not difficult at all Not difficult at all Somewhat difficult Somewhat difficult Not difficult at all       12/02/2022   11:10 AM 07/21/2022   10:49 AM 06/16/2022    9:53 AM 03/04/2022   11:04 AM  GAD 7 : Generalized Anxiety Score  Nervous, Anxious, on Edge 1 1 2 2   Control/stop worrying 1 0 2 2  Worry too much - different things 1 1 2 2   Trouble relaxing 1 0 2 2  Restless 0 1 2 2   Easily annoyed or irritable 0 0 2 3  Afraid - awful might happen 0 0 2 2  Total GAD 7 Score 4 3 14 15   Anxiety Difficulty Not difficult at all Not difficult at all Somewhat difficult Somewhat difficult   Relevant past medical, surgical, family and social history reviewed and updated as indicated. Interim medical history since our last visit reviewed. Allergies and medications reviewed and updated.  Review of Systems  Constitutional:  Negative for activity change, appetite change, diaphoresis, fatigue and fever.  Respiratory:  Negative for cough, chest tightness and shortness of breath.   Cardiovascular:  Negative for chest pain, palpitations and leg swelling.  Gastrointestinal: Negative.   Endocrine: Negative for cold intolerance, heat intolerance, polydipsia, polyphagia and polyuria.   Neurological:  Negative for dizziness, tremors, syncope, weakness and headaches.  Psychiatric/Behavioral: Negative.     Per HPI unless specifically indicated above     Objective:    BP 128/84 (BP Location: Left Arm, Patient Position: Sitting, Cuff Size: Large)   Pulse 70   Temp 98.3 F (36.8 C) (Oral)   Ht 5' 5.98" (1.676 m)   Wt 258 lb 9.6 oz (117.3 kg)   SpO2 98%   BMI 41.76 kg/m   Wt Readings from Last 3 Encounters:  12/02/22 258 lb 9.6 oz (117.3 kg)  10/27/22 264 lb 2 oz (119.8 kg)  08/16/22 267 lb 3.2 oz (121.2 kg)    Physical Exam Vitals and nursing note reviewed.  Constitutional:      General: She is awake. She is not in acute distress.    Appearance: She is well-developed and well-groomed. She is obese. She is not ill-appearing or toxic-appearing.  HENT:     Head: Normocephalic.     Right Ear: Hearing and external ear normal.     Left Ear: Hearing and external ear normal.     Mouth/Throat:     Pharynx: No pharyngeal swelling, oropharyngeal exudate or posterior oropharyngeal erythema.  Eyes:     General: Lids are normal.        Right eye: No discharge.        Left eye: No discharge.     Conjunctiva/sclera: Conjunctivae normal.     Pupils: Pupils are equal, round, and reactive to light.  Neck:     Thyroid: No thyromegaly.     Vascular: No carotid bruit.  Cardiovascular:     Rate and Rhythm: Normal rate and regular rhythm.     Heart sounds: Normal heart sounds. No murmur heard.    No gallop.  Pulmonary:     Effort: Pulmonary effort is normal. No accessory muscle usage or respiratory distress.     Breath sounds: Normal breath sounds.  Abdominal:     General: Bowel sounds are normal. There is no distension.     Palpations: Abdomen is soft.     Tenderness: There is no abdominal tenderness.  Musculoskeletal:     Right shoulder: Tenderness (to anterior and posterior aspect) present. No swelling, laceration, bony tenderness or crepitus. Decreased range of motion  (reduced abduction and forward flexion). Normal strength. Normal pulse.     Left shoulder: Normal.     Cervical back: Normal range of motion and neck supple.     Right lower leg: No edema.     Left lower leg: No edema.  Lymphadenopathy:     Cervical: No cervical adenopathy.  Skin:    General: Skin is warm and dry.  Neurological:     Mental Status: She is alert and oriented to person, place, and time.  Psychiatric:        Attention and Perception: Attention normal.        Mood and Affect: Mood normal.        Speech: Speech normal.        Behavior: Behavior normal. Behavior is cooperative.        Thought Content: Thought content normal.     Results for orders placed or performed in visit on 10/27/22  Microscopic Examination   Urine  Result Value Ref Range   WBC, UA 0-5 0 - 5 /hpf   RBC, Urine 0-2 0 - 2 /hpf   Epithelial Cells (non renal) >10 (A) 0 - 10 /hpf   Mucus, UA Present (A) Not Estab.   Bacteria, UA Many (A) None seen/Few  Urinalysis, Complete  Result Value Ref Range   Specific Gravity, UA 1.025 1.005 - 1.030   pH, UA 5.5 5.0 - 7.5   Color, UA Yellow Yellow   Appearance Ur Clear Clear   Leukocytes,UA Negative Negative   Protein,UA Trace (A) Negative/Trace   Glucose, UA Negative Negative   Ketones, UA Negative Negative   RBC, UA Negative Negative   Bilirubin, UA Negative Negative   Urobilinogen, Ur 0.2 0.2 - 1.0 mg/dL   Nitrite, UA Negative Negative   Microscopic Examination See below:       Assessment & Plan:   Problem List Items Addressed This Visit  Cardiovascular and Mediastinum   Aortic atherosclerosis (HCC)    Chronic.  Noted on imaging 03/17/2018.  Recommend continued use of statin daily and consider addition of ASA in future for prevention.      Relevant Medications   losartan (COZAAR) 50 MG tablet   amLODipine (NORVASC) 5 MG tablet   Other Relevant Orders   Comprehensive metabolic panel   Lipid Panel w/o Chol/HDL Ratio   Hypertension  associated with diabetes (HCC)    Chronic, ongoing.  Initial BP slightly elevated due to recent espresso drink, but repeat trending down.  Continue current medication regimen and adjust as needed.  Focus on DASH diet and regular exercise at home.  Recommend she check BP at few days a week at home and document for provider.  Losartan for kidney protection, urine ALB 80 May 2024.  LABS: CMP and CBC.  Return in 3 months.      Relevant Medications   tirzepatide (MOUNJARO) 10 MG/0.5ML Pen   losartan (COZAAR) 50 MG tablet   amLODipine (NORVASC) 5 MG tablet   Other Relevant Orders   Bayer DCA Hb A1c Waived   CBC with Differential/Platelet   Comprehensive metabolic panel     Respiratory   OSA on CPAP    Chronic, stable with consistent use of CPAP.  Continue to use 100%.        Digestive   Gastroesophageal reflux disease without esophagitis    Chronic, ongoing.  Continue Omeprazole 40 MG daily and continue to collaborate with GI.  Mag level today.      Relevant Medications   omeprazole (PRILOSEC) 40 MG capsule   Other Relevant Orders   Magnesium     Endocrine   Hashimoto's thyroiditis    Chronic, ongoing.  Continue Levothyroxine 150 MCG daily.  Educated her on this.  Thyroid labs obtained today.      Relevant Orders   TSH   T4, free   Hyperlipidemia associated with type 2 diabetes mellitus (HCC)    Chronic, ongoing.  Continue on statin which is offering benefit.  Lipid panel today.  Return in 3 months.      Relevant Medications   tirzepatide (MOUNJARO) 10 MG/0.5ML Pen   losartan (COZAAR) 50 MG tablet   amLODipine (NORVASC) 5 MG tablet   Other Relevant Orders   Bayer DCA Hb A1c Waived   Comprehensive metabolic panel   Lipid Panel w/o Chol/HDL Ratio   Type 2 diabetes mellitus with morbid obesity (HCC)    Chronic, ongoing with A1c 6.9% today and urine ALB 80 (May 2024).  Continue Losartan for kidney protection.  Tolerating Mounjaro well with improved overall health and weight  loss, will increase this to 10 MG weekly.  Will continue Metformin as ordered.  Educated on BS goals in morning and 2 hours after meals.   - On Statin and ARB - Eye and Foot exams up to date - Vaccinations up to date with exception of Shingrix and Td which she wishes to think about - Return in 3 months.      Relevant Medications   tirzepatide [facility]) 10 MG/0.5ML Pen   losartan (COZAAR) 50 MG tablet   Other Relevant Orders   Bayer DCA Hb A1c Waived   Microalbumin, Urine Waived   Type 2 diabetes mellitus with proteinuria (HCC) - Primary    Chronic, ongoing with A1c 6.9% today and urine ALB 80 (May 2024).  Continue Losartan for kidney protection.  Tolerating Mounjaro well with improved overall health and weight loss,  will increase this to 10 MG weekly.  Will continue Metformin as ordered.  Educated on BS goals in morning and 2 hours after meals.   - On Statin and ARB - Eye and Foot exams up to date - Vaccinations up to date with exception of Shingrix and Td which she wishes to think about - Return in 3 months.      Relevant Medications   tirzepatide (MOUNJARO) 10 MG/0.5ML Pen   losartan (COZAAR) 50 MG tablet   Other Relevant Orders   Bayer DCA Hb A1c Waived   Microalbumin, Urine Waived     Nervous and Auditory   Meningioma (HCC)    Followed by neurology, continue collaboration with neurology team and recent notes reviewed.        Other   Bipolar I disorder, single manic episode (HCC)    Chronic, stable.  Denies SI/HI.  Followed by psychiatry, will continue this collaboration and current medication regimen as prescribed by them.  Return in 3 months.      Obesity, Class III, BMI 40-49.9 (morbid obesity) (HCC)    BMI 41.76 with T2DM, HTN/HLD, continues to have weight reduction with Mounjaro.  Recommended eating smaller high protein, low fat meals more frequently and exercising 30 mins a day 5 times a week with a goal of 10-15lb weight loss in the next 3 months. Patient voiced  their understanding and motivation to adhere to these recommendations.        Relevant Medications   tirzepatide (MOUNJARO) 10 MG/0.5ML Pen   Right shoulder pain    Past few months after fall, but is slowly improving.  We discussed ortho or PT referrals, but wishes to hold off at this time due to work schedule.  Recommend she perform PT exercises at home and discussed where she can find these.  Suspect more tendinitis at this time.  Continue Naproxen only as needed == recommend trial of Voltaren gel over the counter or Icy/Hot Lidocaine patches.  If ongoing or worsening then return to office and we will order PT.      Vitamin D deficiency    Chronic, ongoing.  Will continue daily supplement and adjust as needed. Labs today.      Relevant Orders   VITAMIN D 25 Hydroxy (Vit-D Deficiency, Fractures)     Follow up plan: Return in about 3 months (around 03/04/2023) for T2DM, HTN/HLD, MOOD, THYROID, SHOULDER CHECK.

## 2022-12-02 NOTE — Assessment & Plan Note (Signed)
Chronic, ongoing.  Continue on statin which is offering benefit.  Lipid panel today.  Return in 3 months. °

## 2022-12-02 NOTE — Assessment & Plan Note (Addendum)
Chronic, ongoing with A1c 6.9% today and urine ALB 80 (May 2024).  Continue Losartan for kidney protection.  Tolerating Mounjaro well with improved overall health and weight loss, will increase this to 10 MG weekly.  Will continue Metformin as ordered.  Educated on BS goals in morning and 2 hours after meals.   - On Statin and ARB - Eye and Foot exams up to date - Vaccinations up to date with exception of Shingrix and Td which she wishes to think about - Return in 3 months. 

## 2022-12-03 ENCOUNTER — Ambulatory Visit (INDEPENDENT_AMBULATORY_CARE_PROVIDER_SITE_OTHER): Payer: Commercial Managed Care - HMO | Admitting: Psychology

## 2022-12-03 ENCOUNTER — Other Ambulatory Visit: Payer: Self-pay | Admitting: Nurse Practitioner

## 2022-12-03 ENCOUNTER — Encounter: Payer: Self-pay | Admitting: Nurse Practitioner

## 2022-12-03 DIAGNOSIS — F309 Manic episode, unspecified: Secondary | ICD-10-CM | POA: Diagnosis not present

## 2022-12-03 LAB — LIPID PANEL W/O CHOL/HDL RATIO
Cholesterol, Total: 136 mg/dL (ref 100–199)
HDL: 41 mg/dL (ref >39)
LDL Chol Calc (NIH): 64 mg/dL (ref 0–99)
Triglycerides: 189 mg/dL — ABNORMAL HIGH (ref 0–149)
VLDL Cholesterol Cal: 31 mg/dL (ref 5–40)

## 2022-12-03 LAB — COMPREHENSIVE METABOLIC PANEL
ALT: 29 IU/L (ref 0–32)
AST: 18 IU/L (ref 0–40)
Albumin/Globulin Ratio: 1.8 (ref 1.2–2.2)
Albumin: 4.2 g/dL (ref 3.8–4.9)
Alkaline Phosphatase: 112 IU/L (ref 44–121)
BUN/Creatinine Ratio: 13 (ref 9–23)
BUN: 10 mg/dL (ref 6–24)
Bilirubin Total: <0.2 mg/dL (ref 0.0–1.2)
CO2: 21 mmol/L (ref 20–29)
Calcium: 9.3 mg/dL (ref 8.7–10.2)
Chloride: 105 mmol/L (ref 96–106)
Creatinine, Ser: 0.79 mg/dL (ref 0.57–1.00)
Globulin, Total: 2.4 g/dL (ref 1.5–4.5)
Glucose: 176 mg/dL — ABNORMAL HIGH (ref 70–99)
Potassium: 4.4 mmol/L (ref 3.5–5.2)
Sodium: 143 mmol/L (ref 134–144)
Total Protein: 6.6 g/dL (ref 6.0–8.5)
eGFR: 89 mL/min/{1.73_m2} (ref >59)

## 2022-12-03 LAB — CBC WITH DIFFERENTIAL/PLATELET
Basophils Absolute: 0.1 10*3/uL (ref 0.0–0.2)
Basos: 1 %
EOS (ABSOLUTE): 0.2 10*3/uL (ref 0.0–0.4)
Eos: 2 %
Hematocrit: 45 % (ref 34.0–46.6)
Hemoglobin: 14.1 g/dL (ref 11.1–15.9)
Immature Grans (Abs): 0.1 10*3/uL (ref 0.0–0.1)
Immature Granulocytes: 1 %
Lymphocytes Absolute: 2.6 10*3/uL (ref 0.7–3.1)
Lymphs: 26 %
MCH: 27 pg (ref 26.6–33.0)
MCHC: 31.3 g/dL — ABNORMAL LOW (ref 31.5–35.7)
MCV: 86 fL (ref 79–97)
Monocytes Absolute: 0.5 10*3/uL (ref 0.1–0.9)
Monocytes: 5 %
Neutrophils Absolute: 6.7 10*3/uL (ref 1.4–7.0)
Neutrophils: 65 %
Platelets: 283 10*3/uL (ref 150–450)
RBC: 5.23 x10E6/uL (ref 3.77–5.28)
RDW: 14 % (ref 11.7–15.4)
WBC: 10 10*3/uL (ref 3.4–10.8)

## 2022-12-03 LAB — MAGNESIUM: Magnesium: 1.9 mg/dL (ref 1.6–2.3)

## 2022-12-03 LAB — T4, FREE: Free T4: 1.33 ng/dL (ref 0.82–1.77)

## 2022-12-03 LAB — TSH: TSH: 1.11 u[IU]/mL (ref 0.450–4.500)

## 2022-12-03 LAB — VITAMIN D 25 HYDROXY (VIT D DEFICIENCY, FRACTURES): Vit D, 25-Hydroxy: 53.9 ng/mL (ref 30.0–100.0)

## 2022-12-03 MED ORDER — CONTOUR NEXT TEST VI STRP: ORAL_STRIP | 12 refills | Status: AC

## 2022-12-03 NOTE — Progress Notes (Signed)
[facility] Counselor/Therapist Progress Note  Patient ID: Lori Knight, MRN: [id number],    Date: 12/03/2022  Time Spent: 11:34 AM - 12:04 PM:  30 minutes  Treatment Type: Individual Therapy.  Reported Symptoms: Anxiety   Mental Status Exam: Appearance:  Neat and Well Groomed     Behavior: Appropriate  Motor: Normal  Speech/Language:  Clear and Coherent and Normal Rate  Affect: Congruent  Mood: normal  Thought process: normal  Thought content:   WNL  Sensory/Perceptual disturbances:   WNL  Orientation: oriented to person, place, time/date, and situation  Attention: Good  Concentration: Good  Memory: WNL  Fund of knowledge:  Good  Insight:   Good  Judgment:  Good  Impulse Control: Good   Subjective:   Lori Knight participated from home, via video and consented to treatment. Therapist participated from home office. We met online due to COVID pandemic. Lori Knight noted work going well, overall. She noted soon to have reconciling owed funds to her significant other. She noted difficulty with maintaining mundane work related tasks. We explored this during the session and her attempts to manage this. She noted received aid from her fiance, Lori Knight, in some of these areas. She noted her continued work problem solving this. Therapist praised [name] for her effort in this area. She noted some improvement in her daughter's mood but noted a need for continued progress in managing her mood. She noted interest in working through her DBT book during session and noted this being helpful in the past. WE explroed this and therapist praised Lori Knight for this. We scheduled follow-ups for continued treatment. Therapist validated and normalized Lori Knight feelings and experience and provided supportive therapy.  Interventions: Interpersonal  Diagnosis: Bipolar I disorder, single manic episode (HCC)  Plan: Patient is to use CBT, ACT, BA, mindfulness, and coping skills to help manage decrease  symptoms associated with their diagnosis. (Target Date 03/16/23)   Long-term goal:   Reduce overall level, frequency, and intensity of the feelings of depression and anxiety evidenced by negative self-talk, rumination, and poor self-esteem  from 6 to 7 days/week to 0 to 1 days/week per client report for at least 3 consecutive months. Engage in consistent self-care and mindfulness.   Short-term goal:  Verbally express understanding of the relationship between feelings of depression, bipolar, anxiety and their impact on thinking patterns and behaviors.  Verbalize an understanding of the role that distorted thinking plays in creating fears, excessive worry, and ruminations.   Lori Knight participated in the creation of the treatment plan)  Delight Ovens, LCSW

## 2022-12-03 NOTE — Telephone Encounter (Signed)
Requested medications are due for refill today.  See pharmacy note  Requested medications are on the active medications list.    Last refill. none  Future visit scheduled.   yes  Notes to clinic.  Pharmacy comment: Alternative Requested:CONTOUR NON FORMULARY. HER INSURANCE PAYS ONE TOUCH SERIES. FYI.    Requested Prescriptions  Pending Prescriptions Disp Refills   CONTOUR NEXT TEST test strip [Pharmacy Med Name: CONTOUR NEXT TEST STRIP] 100 strip 12    Sig: USE TO CHECK BS UP TO 3 TIMES DAILY FOR DIABETES DX: E11.9     Endocrinology: Diabetes - Testing Supplies Passed - 12/03/2022  9:35 AM      Passed - Valid encounter within last 12 months    Recent Outpatient Visits           Yesterday Type 2 diabetes mellitus with proteinuria (HCC)   [place] [facility] [place], Jones T, NP   4 months ago Type 2 diabetes mellitus with morbid obesity (HCC)   [place] [facility] [place], [place] T, NP   5 months ago Type 2 diabetes mellitus with proteinuria (HCC)   [place] [facility] [place], [place] T, NP   9 months ago Type 2 diabetes mellitus with proteinuria (HCC)   [place] [facility] [place], Dorie Rank, NP   10 months ago Memory changes   [place] [facility] [place], Dorie Rank, NP       Future Appointments             In 3 months Cann[place], Dorie Rank, NP [place] [facility]   In 10 months McG[place]an, Elana [facility] [place]   In 12 months Deirdre Evener, MD [facility]

## 2022-12-03 NOTE — Progress Notes (Signed)
Contacted via MyChart   Good afternoon Pristine, your labs have returned: - CBC shows no anemia or infection. - Kidney function, creatinine and eGFR, remains normal, as is liver function, AST and ALT.  - Cholesterol levels show much improved LDL.  Triglycerides still a little elevated, continue working on diet. - [name] labs stable -- continue all current medications.  No changes needed.  Any questions? Keep being amazing!!  Thank you for allowing me to participate in your care.  I appreciate you. Kindest regards, Willi Borowiak

## 2022-12-10 ENCOUNTER — Encounter: Payer: Self-pay | Admitting: Dermatology

## 2022-12-23 ENCOUNTER — Ambulatory Visit: Payer: Commercial Managed Care - HMO | Admitting: Psychology

## 2023-01-06 ENCOUNTER — Ambulatory Visit (INDEPENDENT_AMBULATORY_CARE_PROVIDER_SITE_OTHER): Payer: Commercial Managed Care - HMO | Admitting: Psychology

## 2023-01-06 DIAGNOSIS — F309 Manic episode, unspecified: Secondary | ICD-10-CM | POA: Diagnosis not present

## 2023-01-06 NOTE — Progress Notes (Signed)
[facility] Counselor/Therapist Progress Note  Patient ID: Lori Knight, MRN: [id number],    Date: 01/06/2023  Time Spent: 11:04 AM - 11:44 aM:  40 minutes  Treatment Type: Individual Therapy.  Reported Symptoms: Anxiety   Mental Status Exam: Appearance:  Neat and Well Groomed     Behavior: Appropriate  Motor: Normal  Speech/Language:  Clear and Coherent and Normal Rate  Affect: Congruent  Mood: dysthymic  Thought process: normal  Thought content:   WNL  Sensory/Perceptual disturbances:   WNL  Orientation: oriented to person, place, time/date, and situation  Attention: Good  Concentration: Good  Memory: WNL  Fund of knowledge:  Good  Insight:   Good  Judgment:  Good  Impulse Control: Good   Subjective:   Lori Knight participated from home, via video, is aware of technical limitations of tele-sessions, and consented to treatment. Therapist participated from home office. We met online due to COVID pandemic. She noted increased works, increased levels of stress, and more interpersonal stressors with buyers. She noted the effect of this on her mood and day-to-day functioning. 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 She noted her current financial status, having paid off debts, as a further element in her stressors. We worked on reviewing her coping skills and how she is managing her current level of stress. 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 She noted identifying feeling tired often being a trigger to poor stress management. She noted working on eating more healthfully, eating at home, and saving money as a result. She noted losing #40's in the past year. Therapist praised Lori Knight for her effort and encouraged continued symptom management, positive coping, and putting stressors into context. We worked on identifying areas of control and lack of control. Therapist validated and normalized Lori Knight feelings and  experience and provided supportive therapy. A follow-up was scheduled for continued treatment.   Interventions: Cognitive Behavioral Therapy and Interpersonal  Diagnosis: Bipolar I disorder, single manic episode (HCC)  Plan: Patient is to use CBT, ACT, BA, mindfulness, and coping skills to help manage decrease symptoms associated with their diagnosis. (Target Date 03/16/23)   Long-term goal:   Reduce overall level, frequency, and intensity of the feelings of depression and anxiety evidenced by negative self-talk, rumination, and poor self-esteem  from 6 to 7 days/week to 0 to 1 days/week per client report for at least 3 consecutive months. Engage in consistent self-care and mindfulness.   Short-term goal:  Verbally express understanding of the relationship between feelings of depression, bipolar, anxiety and their impact on thinking patterns and behaviors.  Verbalize an understanding of the role that distorted thinking plays in creating fears, excessive worry, and ruminations.   Lori Knight participated in the creation of the treatment plan)  Delight Ovens, LCSW

## 2023-02-02 ENCOUNTER — Ambulatory Visit (INDEPENDENT_AMBULATORY_CARE_PROVIDER_SITE_OTHER): Payer: Commercial Managed Care - HMO | Admitting: Psychology

## 2023-02-02 DIAGNOSIS — F309 Manic episode, unspecified: Secondary | ICD-10-CM

## 2023-02-02 DIAGNOSIS — F3111 Bipolar disorder, current episode manic without psychotic features, mild: Secondary | ICD-10-CM

## 2023-02-02 NOTE — Progress Notes (Signed)
[facility] Counselor/Therapist Progress Note  Patient ID: Lori Knight, MRN: [id number],    Date: 02/02/2023  Time Spent: 12:02 PM - 12:41 PM:  39 minutes  Treatment Type: Individual Therapy.  Reported Symptoms: Anxiety   Mental Status Exam: Appearance:  Neat and Well Groomed     Behavior: Appropriate  Motor: Normal  Speech/Language:  Clear and Coherent and Normal Rate  Affect: Congruent  Mood: dysthymic  Thought process: normal  Thought content:   WNL  Sensory/Perceptual disturbances:   WNL  Orientation: oriented to person, place, time/date, and situation  Attention: Good  Concentration: Good  Memory: WNL  Fund of knowledge:  Good  Insight:   Good  Judgment:  Good  Impulse Control: Good   Subjective:   [name] participated from home, via video, is aware of technical limitations of tele-sessions, and consented to treatment. Therapist participated from home office. Kemiah noted recently learning that she was catastrophizing and is working on manage this going forward in relation to her work. She noted worry that she would be in the red financially after working to repay her partner for the loan she took. We processed this during the session. She noted discussing her concerns with her soon-to-be husband and noted having positive communication about finances and joint decision-making. We reviewed a chapter in her DBT workbook regarding the "cost of self-destructive coping skills". She noted her previous coping skills and making progress in this area. She noted making progress in her coping and noting additional concentration in this area. We will continue our work on the DBT skills book going forward. Anavi was engaged and motivated during the session and expressed commitment towards goals. Therapist highlighted Kataleia's growth and provided supportive therapy.   Interventions: Cognitive Behavioral Therapy and Interpersonal  Diagnosis: Bipolar I disorder,  single manic episode (HCC)  Plan: Patient is to use CBT, ACT, BA, mindfulness, and coping skills to help manage decrease symptoms associated with their diagnosis. (Target Date 03/16/23)   Long-term goal:   Reduce overall level, frequency, and intensity of the feelings of depression and anxiety evidenced by negative self-talk, rumination, and poor self-esteem  from 6 to 7 days/week to 0 to 1 days/week per client report for at least 3 consecutive months. Engage in consistent self-care and mindfulness.   Short-term goal:  Verbally express understanding of the relationship between feelings of depression, bipolar, anxiety and their impact on thinking patterns and behaviors.  Verbalize an understanding of the role that distorted thinking plays in creating fears, excessive worry, and ruminations.   [name] participated in the creation of the treatment plan)  Delight Ovens, LCSW

## 2023-02-03 ENCOUNTER — Encounter: Payer: Self-pay | Admitting: Nurse Practitioner

## 2023-02-09 ENCOUNTER — Ambulatory Visit: Payer: Commercial Managed Care - HMO | Admitting: Nurse Practitioner

## 2023-02-19 NOTE — Patient Instructions (Signed)
Iron Deficiency Anemia, Adult  Iron deficiency anemia is when you do not have enough red blood cells or hemoglobin in your blood. This happens because you have too little iron in your body. Hemoglobin carries oxygen to parts of the body. Anemia can cause your body to not get enough oxygen. What are the causes? Not eating enough foods that have iron in them. The body not being able to take in iron well. Blood loss. What increases the risk? Having menstrual periods. Being pregnant. What are the signs or symptoms? Pale skin, lips, and nails. Weakness, dizziness, and getting tired easily. Feeling like you cannot breathe well when moving (shortness of breath). Cold hands and feet. Mild anemia may not cause any symptoms. How is this treated? This condition is treated by finding out why you do not have enough iron and then getting more iron. It may include: Adding foods to your diet that have a lot of iron. Taking iron pills (supplements). If you are pregnant or breastfeeding, you may need to take extra iron. Your diet often does not provide the amount of iron that you need. Getting more vitamin C in your diet. Vitamin C helps your body take in iron. You may need to take iron pills with a glass of orange juice or vitamin C pills. Medicines to make heavy menstrual periods lighter. Surgery or testing procedures to find what is causing the condition. You may need blood tests to see if treatment is working. If the treatment does not seem to be working, you may need more tests. Follow these instructions at home: Medicines Take over-the-counter and prescription medicines only as told by your doctor. This includes iron pills and vitamins. Taking them as told is important because too much iron can be harmful. Take iron pills when your stomach is empty. If you cannot handle this, take them with food. Do not drink milk or take antacids at the same time as your iron pills. Iron pills may turn your poop  (stool)black. If you cannot handle taking iron pills by mouth, ask your doctor about getting iron through: An IV tube. A shot (injection) into a muscle. Eating and drinking Talk with your doctor before changing the foods you eat. Your doctor may tell you to eat foods that have a lot of iron, such as: Liver. Low-fat (lean) beef. Breads and cereals that have iron added to them. Eggs. Dried fruit. Dark green, leafy vegetables. Eat fresh fruits and vegetables that are high in vitamin C. They help your body use iron. Foods with a lot of vitamin C include: Oranges. Peppers. Tomatoes. Mangoes. Managing constipation If you are taking iron pills, they may cause trouble pooping (constipation). To prevent or treat this, you may need to: Drink enough fluid to keep your pee (urine) pale yellow. Take over-the-counter or prescription medicines. Eat foods that are high in fiber. These include beans, whole grains, and fresh fruits and vegetables. Limit foods that are high in fat and sugar. These include fried or sweet foods. General instructions Return to your normal activities when your doctor says that it is safe. Keep all follow-up visits. Contact a doctor if: You feel like you may vomit (nauseous), or you vomit. You feel weak. You get light-headed when getting up from sitting or lying down. You are sweating for no reason. You have trouble pooping. You have worse breathing with physical activity. You have heaviness in your chest. Get help right away if: You faint. If this happens, do not drive yourself   to the hospital. You have a fast heartbeat, or a heartbeat that does not feel regular. Summary Iron deficiency anemia happens when you have too little iron in your body. This condition is treated by finding out why you do not have enough iron in your body and then getting more iron. Take over-the-counter and prescription medicines only as told by your doctor. Eat fresh fruits and vegetables  that are high in vitamin C. Contact a doctor if you have trouble pooping or feel weak. This information is not intended to replace advice given to you by your health care provider. Make sure you discuss any questions you have with your health care provider. Document Revised: 08/06/2021 Document Reviewed: 08/06/2021 Elsevier Patient Education  2024 [employer]vier Inc.  

## 2023-02-22 ENCOUNTER — Ambulatory Visit: Payer: Commercial Managed Care - HMO | Admitting: Nurse Practitioner

## 2023-02-23 ENCOUNTER — Ambulatory Visit (INDEPENDENT_AMBULATORY_CARE_PROVIDER_SITE_OTHER): Payer: Commercial Managed Care - HMO | Admitting: Nurse Practitioner

## 2023-02-23 DIAGNOSIS — D508 Other iron deficiency anemias: Secondary | ICD-10-CM

## 2023-02-23 DIAGNOSIS — E1169 Type 2 diabetes mellitus with other specified complication: Secondary | ICD-10-CM | POA: Diagnosis not present

## 2023-02-23 DIAGNOSIS — E1129 Type 2 diabetes mellitus with other diabetic kidney complication: Secondary | ICD-10-CM

## 2023-02-23 DIAGNOSIS — E66812 Obesity, class 2: Secondary | ICD-10-CM

## 2023-02-23 DIAGNOSIS — E785 Hyperlipidemia, unspecified: Secondary | ICD-10-CM

## 2023-02-23 DIAGNOSIS — Z6839 Body mass index (BMI) 39.0-39.9, adult: Secondary | ICD-10-CM

## 2023-02-23 DIAGNOSIS — F309 Manic episode, unspecified: Secondary | ICD-10-CM | POA: Diagnosis not present

## 2023-02-23 DIAGNOSIS — E1159 Type 2 diabetes mellitus with other circulatory complications: Secondary | ICD-10-CM | POA: Diagnosis not present

## 2023-02-23 DIAGNOSIS — E063 Autoimmune thyroiditis: Secondary | ICD-10-CM

## 2023-02-23 DIAGNOSIS — R809 Proteinuria, unspecified: Secondary | ICD-10-CM

## 2023-02-23 DIAGNOSIS — G4733 Obstructive sleep apnea (adult) (pediatric): Secondary | ICD-10-CM

## 2023-02-23 DIAGNOSIS — I152 Hypertension secondary to endocrine disorders: Secondary | ICD-10-CM

## 2023-02-23 LAB — BAYER DCA HB A1C WAIVED: HB A1C (BAYER DCA - WAIVED): 6.6 % — ABNORMAL HIGH (ref 4.8–5.6)

## 2023-02-23 NOTE — Assessment & Plan Note (Signed)
Chronic, stable.  Denies SI/HI.  Followed by psychiatry and psychology, will continue this collaboration and current medication regimen as prescribed by them.  Return in 6 months.

## 2023-02-23 NOTE — Assessment & Plan Note (Signed)
Chronic, ongoing.  Continue Levothyroxine 150 MCG daily.  Educated her on this.  Thyroid labs up to date in May 2024.

## 2023-02-23 NOTE — Assessment & Plan Note (Signed)
Chronic, ongoing with A1c 6.6% today and urine ALB 80 (May 2024).  21 pounds lost since April. Continue Losartan for kidney protection.  Tolerating Mounjaro well with improved overall health and weight loss, she wishes to maintain at 10 MG weekly.  Will continue Metformin as ordered.  Educated on BS goals in morning and 2 hours after meals.   - On Statin and ARB - Eye and Foot exams up to date - Vaccinations up to date with exception of Shingrix and Td which she wishes to think about - Return in 6 months.

## 2023-02-23 NOTE — Assessment & Plan Note (Addendum)
Chronic, ongoing with A1c 6.6% today and urine ALB 80 (May 2024).  She has lost a total of 21 pounds since April.  Continue Losartan for kidney protection.  Tolerating Mounjaro well with improved overall health and weight loss, she wishes to maintain at 10 MG weekly.  Will continue Metformin as ordered.  Educated on BS goals in morning and 2 hours after meals.   - On Statin and ARB - Eye and Foot exams up to date - Vaccinations up to date with exception of Shingrix and Td which she wishes to think about - Return in 6 months.

## 2023-02-23 NOTE — Progress Notes (Signed)
BP 134/88 (BP Location: Left Arm, Cuff Size: Normal)   Pulse 82   Temp 98.5 F (36.9 C) (Oral)   Wt 243 lb 12.8 oz (110.6 kg)   SpO2 98%   BMI 39.37 kg/m    Subjective:    Patient ID: Lori Knight, female    DOB: [birth year], 52 y.o.   MRN: [id number]  HPI: Lori Knight is a 52 y.o. female  Chief Complaint  Patient presents with   Diabetes   Hypertension   Hyperlipidemia   Hypothyroidism   Manic Behavior   Anemia   DIABETES A1c 6.9% in May. Continues on Metformin 500 MG BID and Mounjaro 10 MG weekly.  Has lost 21 pounds since April -- would prefer to maintain current dose as appetite reduction is at goal right now. Hypoglycemic episodes:no Polydipsia/polyuria: no Visual disturbance: no Chest pain: no Paresthesias: no Glucose Monitoring: no             Accucheck frequency: not checking             Fasting glucose:              Post prandial:              Evening:             Before meals: Taking Insulin?: no             Long acting insulin:             Short acting insulin: Blood Pressure Monitoring: not checking Retinal Examination: Not Up to Date -- [facility] [place] Foot Exam: Up to Date Pneumovax: Up to Date Influenza: Up to Date Aspirin: no   HYPERTENSION / HYPERLIPIDEMIA Taking Losartan 100 MG, Amlodipine 5 MG and Rosuvastatin 40 MG QHS. Echo last 2019 noted EF 50-55%.  Currently not using CPAP at home, forgetting it -- just goes to bed.    Follows with neurosurgery, Dr. Adriana Simas, for meningioma -- no changes made -- imaging 09/14/22.  This remains stable -- to repeat in 3 years.  Saw Dr. Sherryll Burger, with neurology on 06/10/22 for memory changes --- completed speech therapy - is to follow-up with them. Duration of hypertension: chronic BP monitoring frequency: not checking BP range:  BP medication side effects: no Duration of hyperlipidemia: chronic Aspirin: no Recent stressors: no Recurrent headaches: no Visual changes: no Palpitations:  no Dyspnea: no Chest pain: no Lower extremity edema: no Dizzy/lightheaded: no  The ASCVD Risk score (Arnett DK, et al., 2019) failed to calculate for the following reasons:   The patient has a prior MI or stroke diagnosis   HYPOTHYROIDISM Continues Levothyroxine 150 MCG daily.    Has concerns about iron levels, not eating any meat at all.  Does eat fish.  Over past month and half is exhausted.  Finds difficult to find protein.   Thyroid control status:stable Satisfied with current treatment? yes Medication side effects: no Medication compliance: good compliance Etiology of hypothyroidism:  Recent dose adjustment: yes Fatigue: no Cold intolerance: no Heat intolerance: no Weight gain: no Weight loss: no Constipation: no Diarrhea/loose stools: no Palpitations: no Lower extremity edema: no Anxiety/depressed mood: no   BIPOLAR DISORDER Followed by psychiatry [facility]) and psychology -- currently taking Lamictal BID.  She is working with psychology, last visit 02/02/23.  Mood status: stable Satisfied with current treatment?: yes Symptom severity: moderate  Duration of current treatment : chronic Side effects: no Medication compliance: good compliance Psychotherapy/counseling: at  present -- with Camila Li Depressed mood: no Anxious mood: occasional Anhedonia: no Significant weight loss or gain: no Insomnia: no Fatigue: no Feelings of worthlessness or guilt: no Impaired concentration/indecisiveness: yes Suicidal ideations: no Hopelessness: no Crying spells: no    02/23/2023    2:57 PM 12/02/2022   11:10 AM 07/21/2022   10:49 AM 06/16/2022    9:53 AM 03/04/2022   11:04 AM  Depression screen PHQ 2/9  Decreased Interest 0 0 0 2 1  Down, Depressed, Hopeless 0 0 0 2 1  PHQ - 2 Score 0 0 0 4 2  Altered sleeping 0 0 0 0 0  Tired, decreased energy 0 1 1 1 3   Change in appetite 2 1 0 2 3  Feeling bad or failure about yourself  0 0 1 0 3  Trouble concentrating 0 1 3  3 3   Moving slowly or fidgety/restless 0 0 0 1 1  Suicidal thoughts 0 0 0 0 0  PHQ-9 Score 2 3 5 11 15   Difficult doing work/chores Not difficult at all Not difficult at all Not difficult at all Somewhat difficult Somewhat difficult       02/23/2023    2:57 PM 12/02/2022   11:10 AM 07/21/2022   10:49 AM 06/16/2022    9:53 AM  GAD 7 : Generalized Anxiety Score  Nervous, Anxious, on Edge 1 1 1 2   Control/stop worrying 2 1 0 2  Worry too much - different things 2 1 1 2   Trouble relaxing 1 1 0 2  Restless 0 0 1 2  Easily annoyed or irritable 2 0 0 2  Afraid - awful might happen 0 0 0 2  Total GAD 7 Score 8 4 3 14   Anxiety Difficulty Not difficult at all Not difficult at all Not difficult at all Somewhat difficult   Relevant past medical, surgical, family and social history reviewed and updated as indicated. Interim medical history since our last visit reviewed. Allergies and medications reviewed and updated.  Review of Systems  Constitutional:  Negative for activity change, appetite change, diaphoresis, fatigue and fever.  Respiratory:  Negative for cough, chest tightness and shortness of breath.   Cardiovascular:  Negative for chest pain, palpitations and leg swelling.  Gastrointestinal: Negative.   Endocrine: Negative for cold intolerance, heat intolerance, polydipsia, polyphagia and polyuria.  Neurological:  Negative for dizziness, tremors, syncope, weakness and headaches.  Psychiatric/Behavioral: Negative.     Per HPI unless specifically indicated above     Objective:    BP 134/88 (BP Location: Left Arm, Cuff Size: Normal)   Pulse 82   Temp 98.5 F (36.9 C) (Oral)   Wt 243 lb 12.8 oz (110.6 kg)   SpO2 98%   BMI 39.37 kg/m   Wt Readings from Last 3 Encounters:  02/23/23 243 lb 12.8 oz (110.6 kg)  12/02/22 258 lb 9.6 oz (117.3 kg)  10/27/22 264 lb 2 oz (119.8 kg)    Physical Exam Vitals and nursing note reviewed.  Constitutional:      General: She is awake. She is  not in acute distress.    Appearance: She is well-developed and well-groomed. She is obese. She is not ill-appearing or toxic-appearing.  HENT:     Head: Normocephalic.     Right Ear: Hearing and external ear normal.     Left Ear: Hearing and external ear normal.  Eyes:     General: Lids are normal.        Right eye: No  discharge.        Left eye: No discharge.     Conjunctiva/sclera: Conjunctivae normal.     Pupils: Pupils are equal, round, and reactive to light.  Neck:     Thyroid: No thyromegaly.     Vascular: No carotid bruit.  Cardiovascular:     Rate and Rhythm: Normal rate and regular rhythm.     Heart sounds: Normal heart sounds. No murmur heard.    No gallop.  Pulmonary:     Effort: Pulmonary effort is normal. No accessory muscle usage or respiratory distress.     Breath sounds: Normal breath sounds. No decreased breath sounds, wheezing or rhonchi.  Abdominal:     General: Bowel sounds are normal. There is no distension.     Palpations: Abdomen is soft.     Tenderness: There is no abdominal tenderness.  Musculoskeletal:     Cervical back: Normal range of motion and neck supple.     Right lower leg: No edema.     Left lower leg: No edema.  Lymphadenopathy:     Cervical: No cervical adenopathy.  Skin:    General: Skin is warm and dry.  Neurological:     Mental Status: She is alert and oriented to person, place, and time.     Deep Tendon Reflexes: Reflexes are normal and symmetric.     Reflex Scores:      Brachioradialis reflexes are 2+ on the right side and 2+ on the left side.      Patellar reflexes are 2+ on the right side and 2+ on the left side. Psychiatric:        Attention and Perception: Attention normal.        Mood and Affect: Mood normal.        Speech: Speech normal.        Behavior: Behavior normal. Behavior is cooperative.        Thought Content: Thought content normal.    Results for orders placed or performed in visit on 12/02/22  Bayer DCA Hb  A1c Waived  Result Value Ref Range   HB A1C (BAYER DCA - WAIVED) 6.9 (H) 4.8 - 5.6 %  Microalbumin, Urine Waived  Result Value Ref Range   Microalb, Ur Waived 80 (H) 0 - 19 mg/L   Creatinine, Urine Waived 200 10 - 300 mg/dL   Microalb/Creat Ratio 30-300 (H) <30 mg/g  CBC with Differential/Platelet  Result Value Ref Range   WBC 10.0 3.4 - 10.8 x10E3/uL   RBC 5.23 3.77 - 5.28 x10E6/uL   Hemoglobin 14.1 11.1 - 15.9 g/dL   Hematocrit 11.9 14.7 - 46.6 %   MCV 86 79 - 97 fL   MCH 27.0 26.6 - 33.0 pg   MCHC 31.3 (L) 31.5 - 35.7 g/dL   RDW 82.9 56.2 - 13.0 %   Platelets 283 150 - 450 x10E3/uL   Neutrophils 65 Not Estab. %   Lymphs 26 Not Estab. %   Monocytes 5 Not Estab. %   Eos 2 Not Estab. %   Basos 1 Not Estab. %   Neutrophils Absolute 6.7 1.4 - 7.0 x10E3/uL   Lymphocytes Absolute 2.6 0.7 - 3.1 x10E3/uL   Monocytes Absolute 0.5 0.1 - 0.9 x10E3/uL   EOS (ABSOLUTE) 0.2 0.0 - 0.4 x10E3/uL   Basophils Absolute 0.1 0.0 - 0.2 x10E3/uL   Immature Granulocytes 1 Not Estab. %   Immature Grans (Abs) 0.1 0.0 - 0.1 x10E3/uL  Comprehensive metabolic panel  Result Value Ref  Range   Glucose 176 (H) 70 - 99 mg/dL   BUN 10 6 - 24 mg/dL   Creatinine, Ser 8.41 0.57 - 1.00 mg/dL   eGFR 89 >32 GM/WNU/2.72   BUN/Creatinine Ratio 13 9 - 23   Sodium 143 134 - 144 mmol/L   Potassium 4.4 3.5 - 5.2 mmol/L   Chloride 105 96 - 106 mmol/L   CO2 21 20 - 29 mmol/L   Calcium 9.3 8.7 - 10.2 mg/dL   Total Protein 6.6 6.0 - 8.5 g/dL   Albumin 4.2 3.8 - 4.9 g/dL   Globulin, Total 2.4 1.5 - 4.5 g/dL   Albumin/Globulin Ratio 1.8 1.2 - 2.2   Bilirubin Total <0.2 0.0 - 1.2 mg/dL   Alkaline Phosphatase 112 44 - 121 IU/L   AST 18 0 - 40 IU/L   ALT 29 0 - 32 IU/L  Lipid Panel w/o Chol/HDL Ratio  Result Value Ref Range   Cholesterol, Total 136 100 - 199 mg/dL   Triglycerides 536 (H) 0 - 149 mg/dL   HDL 41 >64 mg/dL   VLDL Cholesterol Cal 31 5 - 40 mg/dL   LDL Chol Calc (NIH) 64 0 - 99 mg/dL  TSH  Result  Value Ref Range   TSH 1.110 0.450 - 4.500 uIU/mL  Magnesium  Result Value Ref Range   Magnesium 1.9 1.6 - 2.3 mg/dL  T4, free  Result Value Ref Range   Free T4 1.33 0.82 - 1.77 ng/dL  VITAMIN D 25 Hydroxy (Vit-D Deficiency, Fractures)  Result Value Ref Range   Vit D, 25-Hydroxy 53.9 30.0 - 100.0 ng/mL      Assessment & Plan:   Problem List Items Addressed This Visit       Cardiovascular and Mediastinum   Hypertension associated with diabetes (HCC)    Chronic, ongoing. Overall BP stable.  Continue current medication regimen and adjust as needed.  Focus on DASH diet and regular exercise at home.  Recommend she check BP at few days a week at home and document for provider.  Losartan for kidney protection, urine ALB 80 May 2024.  LABS: CMP and CBC.  Return in 6 months.      Relevant Orders   Bayer DCA Hb A1c Waived   CBC with Differential/Platelet   Comprehensive metabolic panel     Respiratory   OSA on CPAP    Not using at present.  Recommend she use nightly, has at bedside as reminder.        Endocrine   Hashimoto's thyroiditis    Chronic, ongoing.  Continue Levothyroxine 150 MCG daily.  Educated her on this.  Thyroid labs up to date in May 2024.      Hyperlipidemia associated with type 2 diabetes mellitus (HCC)    Chronic, ongoing.  Continue on statin which is offering benefit.  Lipid panel today.       Relevant Orders   Bayer DCA Hb A1c Waived   Comprehensive metabolic panel   Lipid Panel w/o Chol/HDL Ratio   Type 2 diabetes mellitus with morbid obesity (HCC) - Primary    Chronic, ongoing with A1c 6.6% today and urine ALB 80 (May 2024).  She has lost a total of 21 pounds since April.  Continue Losartan for kidney protection.  Tolerating Mounjaro well with improved overall health and weight loss, she wishes to maintain at 10 MG weekly.  Will continue Metformin as ordered.  Educated on BS goals in morning and 2 hours after meals.   -  On Statin and ARB - Eye and Foot  exams up to date - Vaccinations up to date with exception of Shingrix and Td which she wishes to think about - Return in 6 months.      Relevant Orders   Bayer DCA Hb A1c Waived   Type 2 diabetes mellitus with proteinuria (HCC)    Chronic, ongoing with A1c 6.6% today and urine ALB 80 (May 2024).  21 pounds lost since April. Continue Losartan for kidney protection.  Tolerating Mounjaro well with improved overall health and weight loss, she wishes to maintain at 10 MG weekly.  Will continue Metformin as ordered.  Educated on BS goals in morning and 2 hours after meals.   - On Statin and ARB - Eye and Foot exams up to date - Vaccinations up to date with exception of Shingrix and Td which she wishes to think about - Return in 6 months.      Relevant Orders   Bayer DCA Hb A1c Waived     Other   Bipolar I disorder, single manic episode (HCC)    Chronic, stable.  Denies SI/HI.  Followed by psychiatry and psychology, will continue this collaboration and current medication regimen as prescribed by them.  Return in 6 months.      Obesity    BMI BMI 39.37 with T2DM, HTN/HLD, continues to have weight reduction with Mounjaro (21 pounds down since April!!!).  Recommended eating smaller high protein, low fat meals more frequently and exercising 30 mins a day 5 times a week with a goal of 10-15lb weight loss in the next 3 months. Patient voiced their understanding and motivation to adhere to these recommendations.        Other Visit Diagnoses     Other iron deficiency anemia       She has concerns about iron levels, history of lows.  Will check today.   Relevant Orders   CBC with Differential/Platelet   Iron Binding Cap (TIBC)([facility])   Ferritin        Follow up plan: Return in about 6 months (around 08/26/2023) for T2DM, HTN/HLD, MOOD, THYROID, GERD.

## 2023-02-23 NOTE — Assessment & Plan Note (Signed)
Chronic, ongoing. Overall BP stable.  Continue current medication regimen and adjust as needed.  Focus on DASH diet and regular exercise at home.  Recommend she check BP at few days a week at home and document for provider.  Losartan for kidney protection, urine ALB 80 May 2024.  LABS: CMP and CBC.  Return in 6 months.

## 2023-02-23 NOTE — Assessment & Plan Note (Signed)
Not using at present.  Recommend she use nightly, has at bedside as reminder.

## 2023-02-23 NOTE — Assessment & Plan Note (Signed)
BMI BMI 39.37 with T2DM, HTN/HLD, continues to have weight reduction with Mounjaro (21 pounds down since April!!!).  Recommended eating smaller high protein, low fat meals more frequently and exercising 30 mins a day 5 times a week with a goal of 10-15lb weight loss in the next 3 months. Patient voiced their understanding and motivation to adhere to these recommendations.

## 2023-02-23 NOTE — Assessment & Plan Note (Signed)
Chronic, ongoing.  Continue on statin which is offering benefit.  Lipid panel today.

## 2023-02-24 LAB — CBC WITH DIFFERENTIAL/PLATELET
Basophils Absolute: 0 10*3/uL (ref 0.0–0.2)
Basos: 0 %
EOS (ABSOLUTE): 0.1 10*3/uL (ref 0.0–0.4)
Eos: 1 %
Hematocrit: 43.4 % (ref 34.0–46.6)
Hemoglobin: 14 g/dL (ref 11.1–15.9)
Immature Grans (Abs): 0.1 10*3/uL (ref 0.0–0.1)
Immature Granulocytes: 1 %
Lymphocytes Absolute: 2.9 10*3/uL (ref 0.7–3.1)
Lymphs: 33 %
MCH: 27.5 pg (ref 26.6–33.0)
MCHC: 32.3 g/dL (ref 31.5–35.7)
MCV: 85 fL (ref 79–97)
Monocytes Absolute: 0.7 10*3/uL (ref 0.1–0.9)
Monocytes: 8 %
Neutrophils Absolute: 5.1 10*3/uL (ref 1.4–7.0)
Neutrophils: 57 %
Platelets: 315 10*3/uL (ref 150–450)
RBC: 5.1 x10E6/uL (ref 3.77–5.28)
RDW: 13.3 % (ref 11.7–15.4)
WBC: 9 10*3/uL (ref 3.4–10.8)

## 2023-02-24 LAB — LIPID PANEL W/O CHOL/HDL RATIO
Cholesterol, Total: 123 mg/dL (ref 100–199)
HDL: 40 mg/dL (ref >39)
LDL Chol Calc (NIH): 58 mg/dL (ref 0–99)
Triglycerides: 141 mg/dL (ref 0–149)
VLDL Cholesterol Cal: 25 mg/dL (ref 5–40)

## 2023-02-24 LAB — FERRITIN: Ferritin: 75 ng/mL (ref 15–150)

## 2023-02-24 LAB — COMPREHENSIVE METABOLIC PANEL
ALT: 29 IU/L (ref 0–32)
AST: 22 IU/L (ref 0–40)
Albumin: 4.5 g/dL (ref 3.8–4.9)
Alkaline Phosphatase: 98 IU/L (ref 44–121)
BUN/Creatinine Ratio: 8 — ABNORMAL LOW (ref 9–23)
BUN: 6 mg/dL (ref 6–24)
Bilirubin Total: 0.3 mg/dL (ref 0.0–1.2)
CO2: 24 mmol/L (ref 20–29)
Calcium: 9.5 mg/dL (ref 8.7–10.2)
Chloride: 105 mmol/L (ref 96–106)
Creatinine, Ser: 0.71 mg/dL (ref 0.57–1.00)
Globulin, Total: 2.2 g/dL (ref 1.5–4.5)
Glucose: 95 mg/dL (ref 70–99)
Potassium: 4.4 mmol/L (ref 3.5–5.2)
Sodium: 144 mmol/L (ref 134–144)
Total Protein: 6.7 g/dL (ref 6.0–8.5)
eGFR: 102 mL/min/{1.73_m2} (ref >59)

## 2023-02-24 LAB — IRON AND TIBC
Iron Saturation: 18 % (ref 15–55)
Iron: 54 ug/dL (ref 27–159)
Total Iron Binding Capacity: 308 ug/dL (ref 250–450)
UIBC: 254 ug/dL (ref 131–425)

## 2023-02-24 NOTE — Progress Notes (Signed)
Contacted via MyChart   Good afternoon Deyla, your labs have returned and overall look great.  Kidney function, creatinine and eGFR, remains normal, as is liver function, AST and ALT.  CBC shows no anemia or infection.  Iron is on low side of normal, it would not hurt you to take a multivitamin or Slow Fe every other day while you are making diet changes.  Any questions?  No medication changes needed. Keep being amazing!!  Thank you for allowing me to participate in your care.  I appreciate you. Kindest regards, 

## 2023-02-25 ENCOUNTER — Ambulatory Visit: Payer: Commercial Managed Care - HMO | Admitting: Psychology

## 2023-02-25 DIAGNOSIS — F309 Manic episode, unspecified: Secondary | ICD-10-CM

## 2023-02-25 NOTE — Progress Notes (Signed)
[facility] Counselor/Therapist Progress Note  Patient ID: Lori Knight, MRN: [id number],    Date: 02/25/2023  Time Spent: 1:05 PM -1:49 PM:  44 minutes  Treatment Type: Individual Therapy.  Reported Symptoms: Anxiety   Mental Status Exam: Appearance:  Neat and Well Groomed     Behavior: Appropriate  Motor: Normal  Speech/Language:  Clear and Coherent and Normal Rate  Affect: Congruent  Mood: dysthymic  Thought process: normal  Thought content:   WNL  Sensory/Perceptual disturbances:   WNL  Orientation: oriented to person, place, time/date, and situation  Attention: Good  Concentration: Good  Memory: WNL  Fund of knowledge:  Good  Insight:   Good  Judgment:  Good  Impulse Control: Good   Subjective:   Lori Knight participated from home, via video, is aware of technical limitations of tele-sessions, and consented to treatment. Therapist participated from home office. Lori Knight noted recently getting married and noted this going well and being a positive experience but noted some stress due to her friend, who managed much of the wedding, was stressed due to the plans not coming to fruition. She noted her feelings regarding her husband's family largely not attending. She noted getting COVID after her wedding and the stress for her and her husband, Lori Knight. She noted wondering when the use of distraction becomes avoidance. She noted difficulty managing distress and noted going "from 0-100 in a flip of a switch". We worked on identifying the triggers feeling annoyance before the task, expectations of self and others, feeling a lack of control, not being listen to, not making progress, decreasing frustration tolerance, self-talk, assuming worse intent, needing the skill set of de-escalation. We continued to process this during the session. We worked on identifying possible physical symptoms of stress which Lori Knight noted included physical symptoms including tension, BP, heart  rate, headache, eye twitching. Therapist encouraged Kawanna to work on being more mindful of her mood and stress level, managing expectations, and managing negative self-talk as she engages in similar situation. Lori Knight was engaged and motivated during the session and expressed commitment towards the session goals. Therapist provided psycho-education regarding anxiety and cognitive distortions. A follow-up was scheduled for continued treatment.     Interventions: Cognitive Behavioral Therapy and Interpersonal  Diagnosis: Bipolar I disorder, single manic episode (HCC)  Plan: Patient is to use CBT, ACT, BA, mindfulness, and coping skills to help manage decrease symptoms associated with their diagnosis. (Target Date 03/16/23)   Long-term goal:   Reduce overall level, frequency, and intensity of the feelings of depression and anxiety evidenced by negative self-talk, rumination, and poor self-esteem  from 6 to 7 days/week to 0 to 1 days/week per client report for at least 3 consecutive months. Engage in consistent self-care and mindfulness.   Short-term goal:  Verbally express understanding of the relationship between feelings of depression, bipolar, anxiety and their impact on thinking patterns and behaviors.  Verbalize an understanding of the role that distorted thinking plays in creating fears, excessive worry, and ruminations.   Lori Knight participated in the creation of the treatment plan)  Delight Ovens, LCSW

## 2023-02-28 ENCOUNTER — Other Ambulatory Visit: Payer: Self-pay

## 2023-02-28 DIAGNOSIS — K219 Gastro-esophageal reflux disease without esophagitis: Secondary | ICD-10-CM

## 2023-02-28 DIAGNOSIS — E1159 Type 2 diabetes mellitus with other circulatory complications: Secondary | ICD-10-CM

## 2023-02-28 MED ORDER — LOSARTAN POTASSIUM 50 MG PO TABS: 100.0000 mg | ORAL_TABLET | Freq: Every day | ORAL | 4 refills | Status: DC

## 2023-02-28 MED ORDER — AMLODIPINE BESYLATE 5 MG PO TABS
5.0000 mg | ORAL_TABLET | Freq: Every day | ORAL | 4 refills | Status: DC
Start: 2023-02-28 — End: 2023-08-01

## 2023-02-28 MED ORDER — LEVOTHYROXINE SODIUM 150 MCG PO TABS: 150.0000 ug | ORAL_TABLET | Freq: Every day | ORAL | 4 refills | Status: DC

## 2023-02-28 MED ORDER — OMEPRAZOLE 40 MG PO CPDR
DELAYED_RELEASE_CAPSULE | ORAL | 4 refills | Status: DC
Start: 2023-02-28 — End: 2023-08-01

## 2023-02-28 NOTE — Telephone Encounter (Signed)
Patient is now using mail order pharmacy.  

## 2023-03-04 ENCOUNTER — Ambulatory Visit: Payer: Commercial Managed Care - HMO | Admitting: Nurse Practitioner

## 2023-03-08 ENCOUNTER — Other Ambulatory Visit: Payer: Self-pay | Admitting: Nurse Practitioner

## 2023-03-08 MED ORDER — ROSUVASTATIN CALCIUM 40 MG PO TABS: 40.0000 mg | ORAL_TABLET | Freq: Every day | ORAL | 4 refills | Status: DC

## 2023-03-08 MED ORDER — TIRZEPATIDE 10 MG/0.5ML ~~LOC~~ SOAJ: 10.0000 mg | SUBCUTANEOUS | 12 refills | Status: DC

## 2023-03-08 NOTE — Telephone Encounter (Signed)
Medication Refill - Medication:rosuvastatin (CRESTOR) 40 MG tablet / mounjaro 10 mg  /levothyroxine (SYNTHROID) 150 MCG tablet  dispense code 5 /  Has the patient contacted their pharmacy? yes (Agent: If no, request that the patient contact the pharmacy for the refill. If patient does not wish to contact the pharmacy document the reason why and proceed with request.) (Agent: If yes, when and what did the pharmacy advise?)  Preferred Pharmacy (with phone number or street name):  [facility] - [place], [place] - [address] Phone: [phone]  Fax: [phone]     Has the patient been seen for an appointment in the last year OR does the patient have an upcoming appointment? yes  Agent: Please be advised that RX refills may take up to 3 business days. We ask that you follow-up with your pharmacy.

## 2023-03-08 NOTE — Telephone Encounter (Signed)
Patient requesting prescriptions to be sent to mail order.

## 2023-03-08 NOTE — Telephone Encounter (Signed)
Routed to [facility]

## 2023-03-09 NOTE — Telephone Encounter (Signed)
Copied from CRM [id number]. Topic: General - Other >> Mar 09, 2023  8:20 AM Everette C wrote: Reason for CRM: The patient has called requesting to speak directly with B. Russell regarding their prescriptions for levothyroxine (SYNTHROID) 150 MCG tablet [[id number]] and tirzepatide [facility]) 10 MG/0.5ML Pen [[id number]]  The patient shares that they have been in contact with their pharmacy and been directed to contact their PCP   Please contact when possible

## 2023-03-10 ENCOUNTER — Other Ambulatory Visit: Payer: Self-pay | Admitting: Nurse Practitioner

## 2023-03-10 ENCOUNTER — Encounter: Payer: Self-pay | Admitting: Nurse Practitioner

## 2023-03-10 ENCOUNTER — Ambulatory Visit (INDEPENDENT_AMBULATORY_CARE_PROVIDER_SITE_OTHER): Payer: Commercial Managed Care - HMO | Admitting: Psychology

## 2023-03-10 DIAGNOSIS — F309 Manic episode, unspecified: Secondary | ICD-10-CM

## 2023-03-10 DIAGNOSIS — F3110 Bipolar disorder, current episode manic without psychotic features, unspecified: Secondary | ICD-10-CM | POA: Diagnosis not present

## 2023-03-10 MED ORDER — TIRZEPATIDE 10 MG/0.5ML ~~LOC~~ SOAJ: 10.0000 mg | SUBCUTANEOUS | 4 refills | Status: DC

## 2023-03-10 MED ORDER — LEVOTHYROXINE SODIUM 150 MCG PO TABS: 150.0000 ug | ORAL_TABLET | Freq: Every day | ORAL | 3 refills | Status: DC

## 2023-03-10 NOTE — Telephone Encounter (Signed)
Requested medication (s) are due for refill today: Yes  Requested medication (s) are on the active medication list: Yes  Last refill:  03/08/23-see notes  Future visit scheduled: Yes  Notes to clinic:  Unable to refill due to no refill protocol for this medication. Pharmacy requesting a 90 day supply     Requested Prescriptions  Pending Prescriptions Disp Refills   tirzepatide (MOUNJARO) 10 MG/0.5ML Pen 2 mL 12    Sig: Inject 10 mg into the skin once a week.     Off-Protocol Failed - 03/10/2023 12:48 PM      Failed - Medication not assigned to a protocol, review manually.      Passed - Valid encounter within last 12 months    Recent Outpatient Visits           2 weeks ago Type 2 diabetes mellitus with morbid obesity (HCC)   [place] [facility] [place], [place]Que[place] T, NP   3 months ago Type 2 diabetes mellitus with proteinuria (HCC)   [place] [facility] [place], [place] T, NP   7 months ago Type 2 diabetes mellitus with morbid obesity (HCC)   [name] [facility] [place], [place] T, NP   8 months ago Type 2 diabetes mellitus with proteinuria (HCC)   [place] [facility] [place], Corrie Dandy T, NP   1 year ago Type 2 diabetes mellitus with proteinuria (HCC)   [place] [facility] [place], Dorie Rank, NP       Future Appointments             In 5 months Cannady, Dorie Rank, NP [place] [facility] [place]   In 7 months McGowan, Elana Alm [facility] [place]   In 8 months Deirdre Evener, MD [facility] [place] [facility]

## 2023-03-10 NOTE — Progress Notes (Signed)
Comprehensive Clinical Assessment (CCA) Note  03/10/2023 Lori Knight [id number]  Time Spent: 2:02  pm - 2:43  pm: 41 Minutes  Chief Complaint: No chief complaint on file.  Visit Diagnosis: Bipolar Dx (by history and record)   Guardian/Payee:  self    Paperwork requested: No   Reason for Visit /Presenting Problem: F30.9  Mental Status Exam: Appearance:   Casual     Behavior:  Appropriate  Motor:  Normal  Speech/Language:   Clear and Coherent  Affect:  Congruent  Mood:  normal  Thought process:  normal  Thought content:    WNL  Sensory/Perceptual disturbances:    WNL  Orientation:  oriented to person, place, time/date, and situation  Attention:  Good  Concentration:  Good  Memory:  WNL  Fund of knowledge:   Good  Insight:    Good  Judgment:   Good  Impulse Control:  Good   Reported Symptoms:  Bipolar  Risk Assessment: Danger to Self:  No Self-injurious Behavior: No Danger to Others: No Duty to Warn:no Physical Aggression / Violence:No  Access to Firearms a concern: No  Gang Involvement:No  Patient / guardian was educated about steps to take if suicide or homicide risk level increases between visits: n/a While future psychiatric events cannot be accurately predicted, the patient does not currently require acute inpatient psychiatric care and does not currently meet [facility] involuntary commitment criteria.  Substance Abuse History: Current substance abuse: No     Caffeine: None recently.  Tobacco: denied. Alcohol: infrequent per month.  Substance use: denied.   Past Psychiatric History:   Previous psychological history is significant for Bipolar. Outpatient Providers:Daniel Ermalinda Knight, M.D. ([facility]) and Delight Ovens, LCSW [facility]). Rondel Baton, [place], MS speech therapy. Hemang K. Sherryll Burger, MD (neurologist).  History of Psych Hospitalization: yes, at 17, no hospitalization but had pumped stomach.  Psychological Testing:  Hemang K. Sherryll Burger, MD     Abuse History:  Victim of: No.,  na    Report needed: No. Victim of Neglect:No. Perpetrator of  NA   Witness / Exposure to Domestic Violence: No   Protective Services Involvement: No  Witness to [employer] Violence:  No   Family History:  Family History  Problem Relation Age of Onset   Diabetes Maternal Grandmother    Hypertension Maternal Grandmother    Hyperlipidemia Maternal Grandmother    Hypothyroidism Maternal Grandmother    Heart disease Maternal Grandmother    Diabetes Mother    Hypertension Mother    Hypothyroidism Mother    Bipolar disorder Mother    Heart disease Mother    Kidney disease Mother    Thyroid disease Mother    Depression Mother    Anxiety disorder Mother    Drug abuse Mother    Obesity Mother    Alcohol abuse Father    Depression Daughter    Breast cancer Neg Hx    Living situation: the patient lives with their spouse  Sexual Orientation: Straight  Relationship Status: married  Name of spouse / other:Dan (~12 years) If a parent, number of children / ages: Melina (30) (Depression, ADHD)  Support Systems: spouse friends  Surveyor, quantity Stress:  No   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   Military Service: No   Educational History: Education: college graduate  Religion/Sprituality/World View: Christian  Any cultural differences that may affect / interfere with treatment:  not applicable   Recreation/Hobbies: Volunteering, rescue dogs and cats, care for animals.  Stressors: Other: Daughter's mental health  Strengths: Supportive Relationships, Family, Friends, Charity fundraiser, Spirituality, Hopefulness, Journalist, newspaper, and Able to Communicate Effectively  Barriers:  Finances and mood.    Legal History: Pending legal issue / charges: The patient has no significant history of legal issues. History of legal issue / charges:  NA  Medical History/Surgical History: reviewed Past Medical History:  Diagnosis Date    Adult hypothyroidism 06/21/2012   Anxiety    Back pain    Benign paroxysmal positional nystagmus    Bilateral polycystic ovarian syndrome 06/21/2012   Biliary calculus with cholecystitis 08/14/2012   Bipolar 1 disorder (HCC)    BP (high blood pressure) 02/14/2014   Bronchitis    Constipation    COVID-19 07/2020   Depression    Diabetes mellitus (HCC) 08/14/2012   Diabetes mellitus without complication (HCC)    pre-diabetic   Dysplastic nevus 2023   posterior pelvis sacral area-mild atypia   Dysplastic nevus 2019   right suprascapular back-mild atypia   Dysplastic nevus 2019   left posterior thigh - mild atypia   Dysrhythmia    wore heart monitor 2016. Corrected by changing Levothyroxine dose.   Edema of both lower extremities    Gallbladder problem    GERD (gastroesophageal reflux disease)    Heart murmur    followed by PCP-AS A CHILD-ASYMPTOMATIC   Hyperlipidemia    Hyperprolactinemia (HCC) 06/21/2012   Hypertension    Hypothyroid    Hypoxia 04/17/2018   Joint pain    Kidney problem    Meningioma (HCC)    Motion sickness    carnival rides   Neuropathy    PONV (postoperative nausea and vomiting)    Low BP after sinus surgery. WAKES UP CRYING   Shortness of breath dyspnea    stairs. related to wt.   Sleep apnea    has CPAP. has not used since 2011   Swallowing difficulty    Vertigo 2016   none recently   Vitamin D deficiency     Past Surgical History:  Procedure Laterality Date   CHOLECYSTECTOMY     COLONOSCOPY WITH PROPOFOL N/A 06/19/2015   Procedure: COLONOSCOPY WITH PROPOFOL;  Surgeon: Midge Minium, MD;  [facility];  Service: Endoscopy;  Laterality: N/A;  Diabetic - oral meds    DENTAL SURGERY     ESOPHAGOGASTRODUODENOSCOPY (EGD) WITH PROPOFOL N/A 01/16/2021   Procedure: ESOPHAGOGASTRODUODENOSCOPY (EGD) WITH PROPOFOL;  Surgeon: Midge Minium, MD;  Location: [facility];  Service: Endoscopy;  Laterality: N/A;  Diabetic - oral  meds   ETHMOIDECTOMY Bilateral 04/17/2018   Procedure: ETHMOIDECTOMY;  Surgeon: Vernie Murders, MD;  Location: [facility] ORS;  Service: ENT;  Laterality: Bilateral;   GALLBLADDER SURGERY     IMAGE GUIDED SINUS SURGERY N/A 04/17/2018   Procedure: IMAGE GUIDED SINUS SURGERY;  Surgeon: Vernie Murders, MD;  Location: [facility] OR[facility];  Service: ENT;  Laterality: N/A;   MAXILLARY ANTROSTOMY Bilateral 04/17/2018   Procedure: MAXILLARY ANTROSTOMY;  Surgeon: Vernie Murders, MD;  Location: [facility] OR[facility];  Service: ENT;  Laterality: Bilateral;   NASAL SEPTOPLASTY W/ TURBINOPLASTY Bilateral 04/17/2018   Procedure: NASAL SEPTOPLASTY WITH TURBINATE REDUCTION;  Surgeon: Vernie Murders, MD;  Location: [facility] OR[facility];  Service: ENT;  Laterality: Bilateral;   TOTAL ABDOMINAL HYSTERECTOMY  2012   cervical dysplasia/ovaries remian   TRANSURETHRAL RESECTION OF BLADDER TUMOR N/A 04/17/2018   Procedure: NTRANSURETHRAL RESECTION OF BLADDER TUMOR (TURBT) WITH GEMCITABINE;  Surgeon: Vanna Scotland, MD;  Location: [facility] ORS;  Service: Urology;  Laterality: N/A;    Medications:  Current Outpatient Medications  Medication Sig Dispense Refill   acetaminophen (TYLENOL) 500 MG tablet Take 500 mg by mouth every 6 (six) hours as needed.     amLODipine (NORVASC) 5 MG tablet Take 1 tablet (5 mg total) by mouth daily. 90 tablet 4   Ascorbic Acid (VITAMIN C) 1000 MG tablet Take 1,000 mg by mouth daily.     Cholecalciferol 5000 units capsule Take 1 capsule (5,000 Units total) by mouth daily.     EPINEPHrine 0.3 mg/0.3 mL IJ SOAJ injection Inject 0.3 mg into the muscle as needed for anaphylaxis. 1 each 4   glucose blood (CONTOUR NEXT TEST) test strip USE TO CHECK BS UP TO 3 TIMES DAILY FOR DIABETES DX: E11.9 100 strip 12   ipratropium (ATROVENT) 0.06 % nasal spray Place 2 sprays into both nostrils 4 (four) times daily. 15 mL 12   lamoTRIgine (LAMICTAL) 150 MG tablet Take 150 mg by mouth 2 (two) times daily.     levothyroxine (SYNTHROID) 150 MCG tablet Take  1 tablet (150 mcg total) by mouth daily. 90 tablet 3   losartan (COZAAR) 50 MG tablet Take 2 tablets (100 mg total) by mouth daily. 180 tablet 4   metFORMIN (GLUCOPHAGE) 500 MG tablet Take 1 tablet (500 mg total) by mouth 2 (two) times daily. 180 tablet 4   mometasone (ELOCON) 0.1 % cream SMARTSIG:1 Application Topical     nystatin (MYCOSTATIN/NYSTOP) powder Apply 1 application. topically 3 (three) times daily. 15 g 4   Omega-3 Fatty Acids (FISH OIL) 1200 MG CAPS Take 1,200 mg by mouth daily.      omeprazole (PRILOSEC) 40 MG capsule TAKE 1 CAPSULE BY MOUTH EVERY DAY 90 capsule 4   rosuvastatin (CRESTOR) 40 MG tablet Take 1 tablet (40 mg total) by mouth daily. 90 tablet 4   tirzepatide (MOUNJARO) 10 MG/0.5ML Pen Inject 10 mg into the skin once a week. 2 mL 12   No current facility-administered medications for this visit.    Allergies  Allergen Reactions   Hctz [Hydrochlorothiazide] Cough   Lisinopril Cough   Latex Rash    Condoms only   Prednisone Anxiety    Paranoia    Diagnoses:  Bipolar I disorder, single manic episode (HCC)  Plan of Care: Outpatient Therapy.   Narrative:   Lori Knight participated from home, via video, and consented to treatment. Therapist participated from office. We met online due to COVID pandemic.  We discussed the limits of confidentiality prior to the start of evaluation and Thaliyah expressed understanding and willingness to proceed.  This is Lori Knight's annual reassessment. She  continues to see Dr. Chipper Oman with [facility] for psychiatric treatment. She is continuing to take Lamotragine 150mg  x2 in AM. She noted her current stressors including her daughter's mental health health and noted experiencing a slow-down in business but noted this is currently manageable. However, she has a history of anxiety regarding finances. She noted recently having her annual labs and noted that the results were positive. She noted a history of thyroid issues and discussed  having difficulties getting a refill and noted this being frustrating as she works through this process. She noted losing weight ~40 lbs in the past year, is taking medication for weight-loss, and noted this being positive. She noted her daughter recently fostering a dog and the dog being adopted and the difficulty she experiences not getting updates. She noted recently being married and noted the overall experience being positive. Sarae noted worry that as things are going  well in her life, specifically in her relationship, and this would end or the other shoe would drop. She noted often keeping this to herself and not communicated her worries to others. She noted experiencing negative self-talk, regularly, and this has been a point of focus in therapy. She noted replaying negative social interactions and noted often "obsessing" about them She noted, at times, difficulty navigating social stressors at work. She would benefit from consistent counseling to address mood, manage symptoms, bolster coping skills, improve distress tolerance, and reduce negative self-talk. Gursirat has been engage regularly in treatment. A follow-up was scheduled to create a treatment plan.      03/10/2023    2:04 PM 02/23/2023    2:57 PM 12/02/2022   11:10 AM 07/21/2022   10:49 AM 06/16/2022    9:53 AM  Depression screen PHQ 2/9  Decreased Interest 0 0 0 0 2  Down, Depressed, Hopeless 1 0 0 0 2  PHQ - 2 Score 1 0 0 0 4  Altered sleeping 3 0 0 0 0  Tired, decreased energy 3 0 1 1 1   Change in appetite 0 2 1 0 2  Feeling bad or failure about yourself  0 0 0 1 0  Trouble concentrating 1 0 1 3 3   Moving slowly or fidgety/restless 0 0 0 0 1  Suicidal thoughts 0 0 0 0 0  PHQ-9 Score 8 2 3 5 11   Difficult doing work/chores  Not difficult at all Not difficult at all Not difficult at all Somewhat difficult      03/10/2023    2:05 PM 02/23/2023    2:57 PM 12/02/2022   11:10 AM 07/21/2022   10:49 AM  GAD 7 : Generalized Anxiety  Score  Nervous, Anxious, on Edge 1 1 1 1   Control/stop worrying 1 2 1  0  Worry too much - different things 1 2 1 1   Trouble relaxing  1 1 0  Restless 0 0 0 1  Easily annoyed or irritable 3 2 0 0  Afraid - awful might happen 1 0 0 0  Total GAD 7 Score  8 4 3   Anxiety Difficulty Not difficult at all Not difficult at all Not difficult at all Not difficult at all     [facility], LCSW

## 2023-03-10 NOTE — Telephone Encounter (Signed)
Medication Refill - Medication: levothyroxine (SYNTHROID) 150 MCG tablet   Has the patient contacted their pharmacy? YesDorathy Knight from pharmacy called stated they never recvd the order from 8/19 and need it resent  Preferred Pharmacy (with phone number or street name):  [facility] - [place], [place] - [address] Phone: [phone]  Fax: [phone]     Has the patient been seen for an appointment in the last year OR does the patient have an upcoming appointment? Yes.    Agent: Please be advised that RX refills may take up to 3 business days. We ask that you follow-up with your pharmacy.

## 2023-03-10 NOTE — Telephone Encounter (Signed)
Requested Prescriptions  Pending Prescriptions Disp Refills   levothyroxine (SYNTHROID) 150 MCG tablet 90 tablet 3    Sig: Take 1 tablet (150 mcg total) by mouth daily.     Endocrinology:  Hypothyroid Agents Passed - 03/10/2023 12:45 PM      Passed - TSH in normal range and within 360 days    TSH  Date Value Ref Range Status  12/02/2022 1.110 0.450 - 4.500 uIU/mL Final         Passed - Valid encounter within last 12 months    Recent Outpatient Visits           2 weeks ago Type 2 diabetes mellitus with morbid obesity (HCC)   [place] [facility] [place], [place] T, NP   3 months ago Type 2 diabetes mellitus with proteinuria (HCC)   [place] [facility] [place], [place] T, NP   7 months ago Type 2 diabetes mellitus with morbid obesity (HCC)   [place] [facility] [place], [place] T, NP   8 months ago Type 2 diabetes mellitus with proteinuria (HCC)   [place] [facility] [place], Corrie Dandy T, NP   1 year ago Type 2 diabetes mellitus with proteinuria (HCC)   [place] [facility] [place], Dorie Rank, NP       Future Appointments             In 5 months Cannady, Dorie Rank, NP [place] [facility]   In 7 months McGowan, Elana [facility] [place]   In 8 months Deirdre Evener, MD [facility] [place] [facility]

## 2023-03-10 NOTE — Telephone Encounter (Signed)
[name] from [facility] stated they need a 90 day supply order for tirzepatide [facility]) 10 MG/0.5ML Pen. Please f/u with pharmacy   [facility] - [place], [place] - [address] Phone: [phone]  Fax: [phone]

## 2023-03-21 ENCOUNTER — Encounter: Payer: Self-pay | Admitting: Nurse Practitioner

## 2023-03-28 ENCOUNTER — Ambulatory Visit (INDEPENDENT_AMBULATORY_CARE_PROVIDER_SITE_OTHER): Payer: Commercial Managed Care - HMO | Admitting: Psychology

## 2023-03-28 DIAGNOSIS — F309 Manic episode, unspecified: Secondary | ICD-10-CM

## 2023-03-28 NOTE — Progress Notes (Signed)
[facility] Counselor/Therapist Progress Note  Patient ID: Jaquita Mossholder, MRN: [id number]   Date: 03/28/23  Time Spent: 9:02  am - 9:46 am : 44 Minutes  Treatment Type: Individual Therapy.  Reported Symptoms: Bipolar, depression, and anxiety.   Mental Status Exam: Appearance:  Casual     Behavior: Appropriate  Motor: Normal  Speech/Language:  Clear and Coherent  Affect: Appropriate  Mood: normal  Thought process: normal  Thought content:   WNL  Sensory/Perceptual disturbances:   WNL  Orientation: oriented to person, place, time/date, and situation  Attention: Good  Concentration: Good  Memory: WNL  Fund of knowledge:  Good  Insight:   Good  Judgment:  Good  Impulse Control: Good   Risk Assessment: Danger to Self:  No Self-injurious Behavior: No Danger to Others: No Duty to Warn:no Physical Aggression / Violence:No  Access to Firearms a concern: No  Gang Involvement:No   Subjective:   [name] participated from home, via video and consented to treatment. Therapist participated from office. I discussed the limitations of evaluation and management by telemedicine and the availability of in person appointments. The patient expressed understanding and agreed to proceed. Chiyoko reviewed the events of the past week. We reviewed numerous treatment approaches including CBT, BA, Problem Solving, and Solution focused therapy. Psych-education regarding the Adelei's diagnosis of Bipolar I disorder, single manic episode (HCC) was provided during the session. We discussed Doree Longman goals treatment goals which include process events, bolster coping skills, reduce negative self-talk,  improve frustration tolerance, verbalizing thoughts and feelings, being more mindful, self-care, working through [name], managing cognitive distortions (jumps to conclusions), Identify areas of control and lack of control, and navigate interpersonal stressors. [name]  provided verbal approval of the treatment plan.   Interventions: Psycho-education & Goal Setting.   Diagnosis:  Bipolar I disorder, single manic episode [facility])  Psychiatric Treatment: Yes , Christell Faith, M.D. ([facility])    Treatment Plan:  Client Abilities/Strengths Chandra is intelligent, consistent, and motivated for change.   Support System: Family and friends.   Client Treatment Preferences Outpatient Therapy.   Client Statement of Needs Shaniya would like to process events, bolster coping skills, reduce negative self-talk,  improve frustration tolerance, verbalizing thoughts and feelings, being more mindful, self-care, working through [name], managing cognitive distortions (jumps to conclusions), Identify areas of control and lack of control, and navigate interpersonal stressors.   Treatment Level Weekly  Symptoms  Depression:  feeling down, hypersomia, lethargy, difficulty concentrating.  (Status: maintained) Anxiety: feeling anxious, difficulty managing worry, worrying about different things, irritability, and feeling afraid something awful might happen .   (Status: maintained)  Goals:   [name] experiences symptoms of   Treatment plan signed and available on s-drive:  No, pending signature.    Target Date: 03/27/24 Frequency: Weekly  Progress: 0 Modality: individual    Therapist will provide referrals for additional resources as appropriate.  Therapist will provide psycho-education regarding Javonne's diagnosis and corresponding treatment approaches and interventions. Licensed Clinical Social Worker, [place], LCSW will support the patient's ability to achieve the goals identified. will employ CBT, BA, Problem-solving, Solution Focused, Mindfulness,  coping skills, & other evidenced-based practices will be used to promote progress towards healthy functioning to help manage decrease symptoms associated with her diagnosis.   Reduce overall level, frequency,  and intensity of the feelings of depression, anxiety and panic evidenced by decreased overall symptoms from 6 to 7 days/week to 0 to 1 days/week per client report for  at least 3 consecutive months. Verbally express understanding of the relationship between feelings of depression, anxiety and their impact on thinking patterns and behaviors. Verbalize an understanding of the role that distorted thinking plays in creating fears, excessive worry, and ruminations.  [name] participated in the creation of the treatment plan)   Delight Ovens, LCSW

## 2023-04-14 ENCOUNTER — Encounter: Payer: 59 | Admitting: Psychology

## 2023-04-14 DIAGNOSIS — F309 Manic episode, unspecified: Secondary | ICD-10-CM

## 2023-04-14 NOTE — Progress Notes (Signed)
This encounter was created in error - please disregard.

## 2023-04-25 ENCOUNTER — Encounter: Payer: Self-pay | Admitting: Nurse Practitioner

## 2023-04-25 DIAGNOSIS — E063 Autoimmune thyroiditis: Secondary | ICD-10-CM

## 2023-04-25 NOTE — Telephone Encounter (Signed)
Called and scheduled appointment on 04/26/2023 @ 1:20 pm.

## 2023-04-26 ENCOUNTER — Other Ambulatory Visit: Payer: Commercial Managed Care - HMO

## 2023-04-26 DIAGNOSIS — E063 Autoimmune thyroiditis: Secondary | ICD-10-CM

## 2023-04-26 MED ORDER — TIRZEPATIDE 10 MG/0.5ML ~~LOC~~ SOAJ: 10.0000 mg | SUBCUTANEOUS | 4 refills | Status: DC

## 2023-04-27 LAB — TSH: TSH: 0.506 u[IU]/mL (ref 0.450–4.500)

## 2023-04-27 LAB — T4, FREE: Free T4: 1.47 ng/dL (ref 0.82–1.77)

## 2023-04-27 NOTE — Progress Notes (Signed)
Contacted via MyChart   Good morning Lori Knight, your thyroid labs look great.  A little lower than last time but still in normal range.  Are you having symptoms?  We can adjust a tiny bit of needed.

## 2023-05-08 NOTE — Patient Instructions (Addendum)
Voltaren gel over the counter  [facility] Ask your health care provider which exercises are safe for you. Do exercises exactly as told by your health care provider and adjust them as directed. It is normal to feel mild stretching, pulling, tightness, or discomfort as you do these exercises. Stop right away if you feel sudden pain or your pain gets worse. Do not begin these exercises until told by your health care provider. Stretching and range-of-motion exercises These exercises warm up your muscles and joints and improve the movement and flexibility of your elbow. Wrist flexion, assisted  Straighten your left / right elbow in front of you with your palm facing down toward the floor. If told by your health care provider, bend your left / right elbow to a 90-degree angle (right angle) at your side instead of holding it straight. With your other hand, gently push over the back of your left / right hand so your fingers point toward the floor (flexion). Stop when you feel a gentle stretch on the back of your forearm. Hold this position for __________ seconds. Repeat __________ times. Complete this exercise __________ times a day. Wrist extension, assisted  Straighten your left / right elbow in front of you with your palm facing up toward the ceiling. If told by your health care provider, bend your left / right elbow to a 90-degree angle (right angle) at your side instead of holding it straight. With your other hand, gently pull your left / right hand and fingers toward the floor (extension). Stop when you feel a gentle stretch on the palm side of your forearm. Hold this position for __________ seconds. Repeat __________ times. Complete this exercise __________ times a day. Assisted forearm rotation, supination Sit or stand with your elbows at your side. Bend your left / right elbow to a 90-degree angle (right angle). Using your uninjured hand, turn your left / right palm up toward the  ceiling (supination) until you feel a gentle stretch along the inside of your forearm. Hold this position for __________ seconds. Repeat __________ times. Complete this exercise __________ times a day. Assisted forearm rotation, pronation Sit or stand with your elbows at your side. Bend your left / right elbow to a 90-degree angle (right angle). Using your uninjured hand, turn your left / right palm down toward the floor (pronation) until you feel a gentle stretch along the outside of your forearm. Hold this position for __________ seconds. Repeat __________ times. Complete this exercise __________ times a day. Strengthening exercises These exercises build strength and endurance in your forearm and elbow. Endurance is the ability to use your muscles for a long time, even after they get tired. Radial deviation  Stand with a __________ weight or a hammer in your left / right hand. Or, sit while holding a rubber exercise band or tubing, with your left / right forearm supported on a table or countertop. Position your forearm so that the thumb is facing the ceiling, as if you are going to clap your hands. This is the neutral position. Raise your hand upward in front of you so your thumb moves toward the ceiling (radial deviation), or pull up on the rubber tubing. Keep your forearm and elbow still while you move your wrist only. Hold this position for __________ seconds. Slowly return to the starting position. Repeat __________ times. Complete this exercise __________ times a day. Wrist extension, eccentric Sit with your left / right forearm palm-down and supported on a table or  other surface. Let your left / right wrist extend over the edge of the surface. Hold a __________ weight or a piece of exercise band or tubing in your left / right hand. If using a rubber exercise band or tubing, hold the other end of the tubing with your other hand. Use your uninjured hand to move your left / right hand up  toward the ceiling. Take your uninjured hand away and slowly return to the starting position using only your left / right hand. Lowering your arm under tension is called eccentric extension. Repeat __________ times. Complete this exercise __________ times a day. Wrist extension Do not do this exercise if it causes pain at the outside of your elbow. Only do this exercise once instructed by your health care provider. Sit with your left / right forearm supported on a table or other surface and your palm turned down toward the floor. Let your left / right wrist extend over the edge of the surface. Hold a __________ weight or a piece of rubber exercise band or tubing. If you are using a rubber exercise band or tubing, hold the band or tubing in place with your other hand to provide resistance. Slowly bend your wrist so your hand moves up toward the ceiling (extension). Move only your wrist, keeping your forearm and elbow still. Hold this position for __________ seconds. Slowly return to the starting position. Repeat __________ times. Complete this exercise __________ times a day. Forearm rotation, supination To do this exercise, you will need a lightweight hammer or rubber mallet. Sit with your left / right forearm supported on a table or other surface. Bend your elbow to a 90-degree angle (right angle). Position your forearm so that your palm is facing down toward the floor, with your hand resting over the edge of the table. Hold a hammer in your left / right hand. To make this exercise easier, hold the hammer near the head of the hammer. To make this exercise harder, hold the hammer near the end of the handle. Without moving your wrist or elbow, slowly rotate your forearm so your palm faces up toward the ceiling (supination). Hold this position for __________ seconds. Slowly return to the starting position. Repeat __________ times. Complete this exercise __________ times a day. Shoulder blade  squeeze Sit in a stable chair or stand with good posture. If you are sitting down, do not let your back touch the back of the chair. Your arms should be at your sides with your elbows bent to a 90-degree angle (right angle). Position your forearms so that your thumbs are facing the ceiling (neutral position). Without lifting your shoulders up, squeeze your shoulder blades tightly together. Hold this position for __________ seconds. Slowly release and return to the starting position. Repeat __________ times. Complete this exercise __________ times a day. This information is not intended to replace advice given to you by your health care provider. Make sure you discuss any questions you have with your health care provider. Document Revised: 09/17/2019 Document Reviewed: 09/19/2019 Elsevier Patient Education  2024 [employer].

## 2023-05-11 ENCOUNTER — Ambulatory Visit (INDEPENDENT_AMBULATORY_CARE_PROVIDER_SITE_OTHER): Payer: 59 | Admitting: Psychology

## 2023-05-11 DIAGNOSIS — F309 Manic episode, unspecified: Secondary | ICD-10-CM | POA: Diagnosis not present

## 2023-05-11 NOTE — Progress Notes (Signed)
[facility] Counselor/Therapist Progress Note  Patient ID: Lori Knight, MRN: [id number]   Date: 05/11/23  Time Spent: 9:04  am - 9:52 am : 48 Minutes  Treatment Type: Individual Therapy.  Reported Symptoms: Bipolar, depression, and anxiety.   Mental Status Exam: Appearance:  Casual     Behavior: Appropriate  Motor: Normal  Speech/Language:  Clear and Coherent  Affect: Congruent  Mood: anxious  Thought process: normal  Thought content:   WNL  Sensory/Perceptual disturbances:   WNL  Orientation: oriented to person, place, time/date, and situation  Attention: Good  Concentration: Good  Memory: WNL  Fund of knowledge:  Good  Insight:   Good  Judgment:  Good  Impulse Control: Good   Risk Assessment: Danger to Self:  No Self-injurious Behavior: No Danger to Others: No Duty to Warn:no Physical Aggression / Violence:No  Access to Firearms a concern: No  Gang Involvement:No   Subjective:   Lori Knight participated from home, via video and consented to treatment. Therapist participated from home office. Lori Knight noted visiting with her psychiatric provider regarding her medication and noted that her provider is moving to a new practice. She noted a medication change to Lamictal 150 bid which was previously taken in the AM. She noted the possibility that she would need a new provider if her provider's new practice does not accept her insurance and will request resources from therapist. I am not depressed but not as much in control of my emotions. She noted engaging in some behaviors, sharing inner feelings, and "obsessing". She noted feeling a less control of her emotion and noted this being both difficult to manage and  could result on interpersonal stressors. She noted recently sharing with a coworker but noted worry that this would be shared with others. She noted this issue that she shared about being an underlying issue for some time. She noted "obsessing" about  this issue and an increased level of worry.  We discussed being mindful of stressors, working addressing issues proactively, and manage distress. She noted a need to "not vomit on people (venting)" due to this creating distress in regards to mood and at work. She noted a need to build support system that will allow her to "vent" to others without the possible cost. We discussed ways to be more mindful of stress, being more proactive with stress management, and check-ins. She noted difficulty, in the past, in communicating concerns without becoming activated or frustrated. Therapist encouraged Lori Knight to identify, in detail, what this has looked like in the past to be processed going forward and how she would like to deal with said situations going forward. Lori Knight was engaged and motivated during the session and expressed commitment towards goals. Therapist praised [name] and provided supportive therapy.     Interventions: CBT and interpersonal  Diagnosis:  Bipolar I disorder, single manic episode [facility])  Psychiatric Treatment: Yes , Christell Faith, M.D. ([facility])    Treatment Plan:  Client Abilities/Strengths [name] is intelligent, consistent, and motivated for change.   Support System: Family and friends.   Client Treatment Preferences Outpatient Therapy.   Client Statement of Needs Lyrica would like to process events, bolster coping skills, reduce negative self-talk,  improve frustration tolerance, verbalizing thoughts and feelings, being more mindful, self-care, working through [name], managing cognitive distortions (jumps to conclusions), Identify areas of control and lack of control, and navigate interpersonal stressors.   Treatment Level Weekly  Symptoms  Depression:  feeling down, hypersomia, lethargy, difficulty concentrating.  (Status:  maintained) Anxiety: feeling anxious, difficulty managing worry, worrying about different things, irritability, and feeling afraid  something awful might happen .   (Status: maintained)  Goals:   Lori Knight experiences symptoms of   Treatment plan signed and available on s-drive:  No, pending signature.    Target Date: 03/27/24 Frequency: Weekly  Progress: 0 Modality: individual    Therapist will provide referrals for additional resources as appropriate.  Therapist will provide psycho-education regarding Iriel's diagnosis and corresponding treatment approaches and interventions. Licensed Clinical Social Worker, [place], LCSW will support the patient's ability to achieve the goals identified. will employ CBT, BA, Problem-solving, Solution Focused, Mindfulness,  coping skills, & other evidenced-based practices will be used to promote progress towards healthy functioning to help manage decrease symptoms associated with her diagnosis.   Reduce overall level, frequency, and intensity of the feelings of depression, anxiety and panic evidenced by decreased overall symptoms from 6 to 7 days/week to 0 to 1 days/week per client report for at least 3 consecutive months. Verbally express understanding of the relationship between feelings of depression, anxiety and their impact on thinking patterns and behaviors. Verbalize an understanding of the role that distorted thinking plays in creating fears, excessive worry, and ruminations.  Lori Knight participated in the creation of the treatment plan)   Delight Ovens, LCSW

## 2023-05-13 ENCOUNTER — Ambulatory Visit: Payer: Managed Care, Other (non HMO) | Admitting: Nurse Practitioner

## 2023-05-13 VITALS — BP 121/86 | HR 73 | Temp 98.1°F | Ht 66.0 in | Wt 231.6 lb

## 2023-05-13 DIAGNOSIS — M7710 Lateral epicondylitis, unspecified elbow: Secondary | ICD-10-CM | POA: Insufficient documentation

## 2023-05-13 DIAGNOSIS — M7712 Lateral epicondylitis, left elbow: Secondary | ICD-10-CM

## 2023-05-13 NOTE — Assessment & Plan Note (Signed)
Acute and ongoing for months with some improvement today.  Is right hand dominant.  No recent injuries.  Can not take any steroids.  Recommend she continue Naproxen as needed.  Obtain over the counter Voltaren gel and apply as needed during day to area. - Ice area 3-4 times a day for 20 minutes to assist with pain and swelling - Wear elbow support sleeve during daytime - Provided her elbow PT to perform at home - Return in 2 weeks, if ongoing then will send to PT.

## 2023-05-13 NOTE — Progress Notes (Signed)
BP 121/86 (BP Location: Left Arm, Patient Position: Sitting, Cuff Size: Large)   Pulse 73   Temp 98.1 F (36.7 C) (Oral)   Ht 5\' 6"  (1.676 m)   Wt 231 lb 9.6 oz (105.1 kg)   SpO2 98%   BMI 37.38 kg/m    Subjective:    Patient ID: Lori Knight, female    DOB: Jan 17, 1971, 52 y.o.   MRN: [id number]  HPI: Lori Knight is a 52 y.o. female  Chief Complaint  Patient presents with   Elbow Pain    Left elbow aches for about 6-7 months, prior treatment has been OTC neproxen, would like to also discuss notes from psychiatrist    ELBOW PAIN Presents for left elbow pain for 6-7 months, this presented after she had some right shoulder pain at the time -- but shoulder pain has improved.  Takes Naproxen as needed, which helps.  She is right hand dominant.  Follows with psychiatry -- has started some occasional depressive/irritable episodes since starting Mounjaro.  Psychiatrist has split Lamotrigine to twice a day which is helping. Duration: months Location: elbow Mechanism of injury: unknown Onset: gradual Severity: 7/10 at worst -- today 0/10 (on pain) Quality:  dull, aching, and throbbing Frequency: constant -- but now more intermittent Radiation: no Aggravating factors: bending and movement  Alleviating factors: Naproxen  Status: fluctuating Treatments attempted: Naproxen  Relief with NSAIDs?:  moderate Swelling: yes Redness: yes  Warmth: no Trauma: no Chest pain: no  Shortness of breath: no  Fever: no Decreased sensation: no Paresthesias: no Weakness: no   Relevant past medical, surgical, family and social history reviewed and updated as indicated. Interim medical history since our last visit reviewed. Allergies and medications reviewed and updated.  Review of Systems  Constitutional:  Negative for activity change, appetite change, diaphoresis, fatigue and fever.  Respiratory:  Negative for cough, chest tightness and shortness of breath.   Cardiovascular:   Negative for chest pain, palpitations and leg swelling.  Gastrointestinal: Negative.   Musculoskeletal:  Positive for arthralgias.  Neurological:  Negative for dizziness, tremors, syncope, weakness and headaches.  Psychiatric/Behavioral: Negative.     Per HPI unless specifically indicated above     Objective:    BP 121/86 (BP Location: Left Arm, Patient Position: Sitting, Cuff Size: Large)   Pulse 73   Temp 98.1 F (36.7 C) (Oral)   Ht 5\' 6"  (1.676 m)   Wt 231 lb 9.6 oz (105.1 kg)   SpO2 98%   BMI 37.38 kg/m   Wt Readings from Last 3 Encounters:  05/13/23 231 lb 9.6 oz (105.1 kg)  02/23/23 243 lb 12.8 oz (110.6 kg)  12/02/22 258 lb 9.6 oz (117.3 kg)    Physical Exam Vitals and nursing note reviewed.  Constitutional:      General: She is awake. She is not in acute distress.    Appearance: She is well-developed and well-groomed. She is obese. She is not ill-appearing or toxic-appearing.  HENT:     Head: Normocephalic.     Right Ear: Hearing and external ear normal.     Left Ear: Hearing and external ear normal.  Eyes:     General: Lids are normal.        Right eye: No discharge.        Left eye: No discharge.     Conjunctiva/sclera: Conjunctivae normal.     Pupils: Pupils are equal, round, and reactive to light.  Neck:     Thyroid: No thyromegaly.  Vascular: No carotid bruit.  Cardiovascular:     Rate and Rhythm: Normal rate and regular rhythm.     Pulses:          Radial pulses are 2+ on the right side and 2+ on the left side.     Heart sounds: Normal heart sounds. No murmur heard.    No gallop.  Pulmonary:     Effort: Pulmonary effort is normal. No accessory muscle usage or respiratory distress.     Breath sounds: Normal breath sounds. No decreased breath sounds, wheezing or rhonchi.  Abdominal:     General: Bowel sounds are normal. There is no distension.     Palpations: Abdomen is soft.     Tenderness: There is no abdominal tenderness.  Musculoskeletal:      Right elbow: Normal.     Left elbow: Swelling (mild to lateral epicondyle) present. No effusion. Decreased range of motion. Tenderness present in lateral epicondyle.     Cervical back: Normal range of motion and neck supple.     Right lower leg: No edema.     Left lower leg: No edema.  Lymphadenopathy:     Cervical: No cervical adenopathy.  Skin:    General: Skin is warm and dry.  Neurological:     Mental Status: She is alert and oriented to person, place, and time.     Deep Tendon Reflexes: Reflexes are normal and symmetric.     Reflex Scores:      Brachioradialis reflexes are 2+ on the right side and 2+ on the left side.      Patellar reflexes are 2+ on the right side and 2+ on the left side. Psychiatric:        Attention and Perception: Attention normal.        Mood and Affect: Mood normal.        Speech: Speech normal.        Behavior: Behavior normal. Behavior is cooperative.        Thought Content: Thought content normal.    Diabetic Foot Exam - Simple   Simple Foot Form Visual Inspection No deformities, no ulcerations, no other skin breakdown bilaterally: Yes Sensation Testing Intact to touch and monofilament testing bilaterally: Yes Pulse Check Posterior Tibialis and Dorsalis pulse intact bilaterally: Yes Comments     Results for orders placed or performed in visit on 04/26/23  TSH  Result Value Ref Range   TSH 0.506 0.450 - 4.500 uIU/mL  T4, free  Result Value Ref Range   Free T4 1.47 0.82 - 1.77 ng/dL      Assessment & Plan:   Problem List Items Addressed This Visit       Musculoskeletal and Integument   Lateral epicondylitis - Primary    Acute and ongoing for months with some improvement today.  Is right hand dominant.  No recent injuries.  Can not take any steroids.  Recommend she continue Naproxen as needed.  Obtain over the counter Voltaren gel and apply as needed during day to area. - Ice area 3-4 times a day for 20 minutes to assist with pain and  swelling - Wear elbow support sleeve during daytime - Provided her elbow PT to perform at home - Return in 2 weeks, if ongoing then will send to PT.        Follow up plan: Return in about 2 weeks (around 05/27/2023) for Elbow Pain.

## 2023-05-15 ENCOUNTER — Encounter: Payer: Self-pay | Admitting: Nurse Practitioner

## 2023-05-23 ENCOUNTER — Encounter: Payer: Self-pay | Admitting: Nurse Practitioner

## 2023-05-29 ENCOUNTER — Ambulatory Visit: Payer: Managed Care, Other (non HMO)

## 2023-05-29 ENCOUNTER — Encounter: Payer: Self-pay | Admitting: Emergency Medicine

## 2023-05-29 ENCOUNTER — Encounter: Payer: Self-pay | Admitting: Nurse Practitioner

## 2023-05-29 ENCOUNTER — Ambulatory Visit
Admission: EM | Admit: 2023-05-29 | Discharge: 2023-05-29 | Disposition: A | Discharge status: Home / Self Care | Payer: Managed Care, Other (non HMO) | Attending: Family Medicine | Admitting: Family Medicine

## 2023-05-29 DIAGNOSIS — Y92009 Unspecified place in unspecified non-institutional (private) residence as the place of occurrence of the external cause: Secondary | ICD-10-CM

## 2023-05-29 DIAGNOSIS — W19XXXA Unspecified fall, initial encounter: Secondary | ICD-10-CM

## 2023-05-29 DIAGNOSIS — S93492A Sprain of other ligament of left ankle, initial encounter: Secondary | ICD-10-CM

## 2023-05-29 MED ORDER — NAPROXEN 500 MG PO TABS: 500.0000 mg | ORAL_TABLET | Freq: Two times a day (BID) | ORAL | 0 refills | Status: DC

## 2023-05-29 NOTE — Discharge Instructions (Signed)
If medication was prescribed, stop by the pharmacy to pick up your prescriptions.  For your  pain, Take 1500 mg Tylenol twice a day,  take Naprosyn twice a day,  as needed for pain.  Wear your ankle brace. Rest and elevate the affected painful area.  Apply cold compresses intermittently, as needed.  As pain recedes, begin normal activities slowly as tolerated.  Follow up with primary care provider or an orthopedic provider, if symptoms persist.  Watch for worsening symptoms such as an increasing weakness or loss of sensation, increasing pain and/or the loss of bladder or bowel function. Should any of these occur, go to the emergency department immediately.

## 2023-05-29 NOTE — ED Triage Notes (Signed)
Patient states that she slipped and fell at home early this morning.  Patient c/o left knee pain and left ankle pain.

## 2023-05-29 NOTE — ED Provider Notes (Signed)
[facility]    CSN: [id number] Arrival date & time: 05/29/23  8469      History   Chief Complaint Chief Complaint  Patient presents with   Fall   Ankle Pain    left    HPI  HPI Lori Knight is a 52 y.o. female.   Lori Knight presents after a fell today injuring her left ankle and knee.  She went on the back patio and slipped on some ice around 6:30 AM.  She landed on her right knee and twisted her ankle.  She iced the area twice prior to arrival.  Nothing taken for pain.      Past Medical History:  Diagnosis Date   Adult hypothyroidism 06/21/2012   Anxiety    Back pain    Benign paroxysmal positional nystagmus    Bilateral polycystic ovarian syndrome 06/21/2012   Biliary calculus with cholecystitis 08/14/2012   Bipolar 1 disorder (HCC)    BP (high blood pressure) 02/14/2014   Bronchitis    Constipation    COVID-19 07/2020   Depression    Diabetes mellitus (HCC) 08/14/2012   Diabetes mellitus without complication (HCC)    pre-diabetic   Dysplastic nevus 2023   posterior pelvis sacral area-mild atypia   Dysplastic nevus 2019   right suprascapular back-mild atypia   Dysplastic nevus 2019   left posterior thigh - mild atypia   Dysrhythmia    wore heart monitor 2016. Corrected by changing Levothyroxine dose.   Edema of both lower extremities    Gallbladder problem    GERD (gastroesophageal reflux disease)    Heart murmur    followed by PCP-AS A CHILD-ASYMPTOMATIC   Hyperlipidemia    Hyperprolactinemia (HCC) 06/21/2012   Hypertension    Hypothyroid    Hypoxia 04/17/2018   Joint pain    Kidney problem    Meningioma (HCC)    Motion sickness    carnival rides   Neuropathy    PONV (postoperative nausea and vomiting)    Low BP after sinus surgery. WAKES UP CRYING   Shortness of breath dyspnea    stairs. related to wt.   Sleep apnea    has CPAP. has not used since 2011   Swallowing difficulty    Vertigo 2016   none recently   Vitamin D  deficiency     Patient Active Problem List   Diagnosis Date Noted   Lateral epicondylitis 05/13/2023   Right shoulder pain 12/02/2022   Macromastia 03/04/2022   Chronic midline low back pain without sciatica 01/15/2022   Hemorrhoids, external 12/02/2021   Aortic atherosclerosis (HCC) 09/30/2020   Type 2 diabetes mellitus with proteinuria (HCC) 02/20/2020   Meningioma (HCC) 12/24/2019   Hyperlipidemia associated with type 2 diabetes mellitus (HCC) 08/13/2019   Gastroesophageal reflux disease without esophagitis 06/20/2019   Type 2 diabetes mellitus with morbid obesity (HCC) 12/01/2015   OSA on CPAP 09/22/2015   Internal hemorrhoids 08/28/2015   Vitamin D deficiency 08/28/2015   Obesity 08/27/2015   Hypertension associated with diabetes (HCC) 02/14/2014   Benign paroxysmal positional nystagmus 02/14/2014   Benign neoplasm of kidney 08/14/2012   Bipolar I disorder, single manic episode (HCC) 08/14/2012   Hashimoto's thyroiditis 06/21/2012   Bilateral polycystic ovarian syndrome 06/21/2012    Past Surgical History:  Procedure Laterality Date   CHOLECYSTECTOMY     COLONOSCOPY WITH PROPOFOL N/A 06/19/2015   Procedure: COLONOSCOPY WITH PROPOFOL;  Surgeon: Midge Minium, MD;  Location: [facility];  Service: Endoscopy;  Laterality:  N/A;  Diabetic - oral meds    DENTAL SURGERY     ESOPHAGOGASTRODUODENOSCOPY (EGD) WITH PROPOFOL N/A 01/16/2021   Procedure: ESOPHAGOGASTRODUODENOSCOPY (EGD) WITH PROPOFOL;  Surgeon: Midge Minium, MD;  Location: [facility];  Service: Endoscopy;  Laterality: N/A;  Diabetic - oral meds   ETHMOIDECTOMY Bilateral 04/17/2018   Procedure: ETHMOIDECTOMY;  Surgeon: Vernie Murders, MD;  Location: [facility];  Service: ENT;  Laterality: Bilateral;   GALLBLADDER SURGERY     IMAGE GUIDED SINUS SURGERY N/A 04/17/2018   Procedure: IMAGE GUIDED SINUS SURGERY;  Surgeon: Vernie Murders, MD;  Location: [facility];  Service: ENT;  Laterality: N/A;   MAXILLARY  ANTROSTOMY Bilateral 04/17/2018   Procedure: MAXILLARY ANTROSTOMY;  Surgeon: Vernie Murders, MD;  Location: [facility];  Service: ENT;  Laterality: Bilateral;   NASAL SEPTOPLASTY W/ TURBINOPLASTY Bilateral 04/17/2018   Procedure: NASAL SEPTOPLASTY WITH TURBINATE REDUCTION;  Surgeon: Vernie Murders, MD;  Location: [facility];  Service: ENT;  Laterality: Bilateral;   TOTAL ABDOMINAL HYSTERECTOMY  2012   cervical dysplasia/ovaries remian   TRANSURETHRAL RESECTION OF BLADDER TUMOR N/A 04/17/2018   Procedure: NTRANSURETHRAL RESECTION OF BLADDER TUMOR (TURBT) WITH GEMCITABINE;  Surgeon: Vanna Scotland, MD;  Location: [facility];  Service: Urology;  Laterality: N/A;    OB History     Gravida  1   Para  1   Term      Preterm      AB      Living         SAB      IAB      Ectopic      Multiple      Live Births               Home Medications    Prior to Admission medications   Medication Sig Start Date End Date Taking? Authorizing Provider  naproxen (NAPROSYN) 500 MG tablet Take 1 tablet (500 mg total) by mouth 2 (two) times daily with a meal. 05/29/23  Yes Latissa Frick, DO  acetaminophen (TYLENOL) 500 MG tablet Take 500 mg by mouth every 6 (six) hours as needed.    [provider]  amLODipine (NORVASC) 5 MG tablet Take 1 tablet (5 mg total) by mouth daily. 02/28/23   Cannady, Corrie Dandy T, NP  Ascorbic Acid (VITAMIN C) 1000 MG tablet Take 1,000 mg by mouth daily.    [provider]  Cholecalciferol 5000 units capsule Take 1 capsule (5,000 Units total) by mouth daily. 11/28/15   Plonk, Chrissie Noa, MD  EPINEPHrine 0.3 mg/0.3 mL IJ SOAJ injection Inject 0.3 mg into the muscle as needed for anaphylaxis. 11/17/21   Cannady, Jolene T, NP  glucose blood (CONTOUR NEXT TEST) test strip USE TO CHECK BS UP TO 3 TIMES DAILY FOR DIABETES DX: E11.9 12/03/22   Cannady, Jolene T, NP  ipratropium (ATROVENT) 0.06 % nasal spray Place 2 sprays into both nostrils 4 (four) times daily. 10/12/21    Becky Augusta, NP  lamoTRIgine (LAMICTAL) 150 MG tablet Take 150 mg by mouth 2 (two) times daily. 07/03/20   [provider]  levothyroxine (SYNTHROID) 150 MCG tablet Take 1 tablet (150 mcg total) by mouth daily. 03/10/23   Cannady, Corrie Dandy T, NP  losartan (COZAAR) 50 MG tablet Take 2 tablets (100 mg total) by mouth daily. 02/28/23   Cannady, Corrie Dandy T, NP  metFORMIN (GLUCOPHAGE) 500 MG tablet Take 1 tablet (500 mg total) by mouth 2 (two) times daily. 03/04/22   Marjie Skiff, NP  mometasone (ELOCON) 0.1 % cream SMARTSIG:1 Application Topical 01/28/21   [provider]  nystatin (MYCOSTATIN/NYSTOP) powder Apply 1 application. topically 3 (three) times daily. 12/02/21   Cannady, Corrie Dandy T, NP  Omega-3 Fatty Acids (FISH OIL) 1200 MG CAPS Take 1,200 mg by mouth daily.     [provider]  omeprazole (PRILOSEC) 40 MG capsule TAKE 1 CAPSULE BY MOUTH EVERY DAY 02/28/23   Cannady, Corrie Dandy T, NP  rosuvastatin (CRESTOR) 40 MG tablet Take 1 tablet (40 mg total) by mouth daily. 03/08/23   Cannady, Corrie Dandy T, NP  tirzepatide [facility]) 10 MG/0.5ML Pen Inject 10 mg into the skin once a week. 04/26/23   Aura Dials T, NP    Family History Family History  Problem Relation Age of Onset   Diabetes Maternal Grandmother    Hypertension Maternal Grandmother    Hyperlipidemia Maternal Grandmother    Hypothyroidism Maternal Grandmother    Heart disease Maternal Grandmother    Diabetes Mother    Hypertension Mother    Hypothyroidism Mother    Bipolar disorder Mother    Heart disease Mother    Kidney disease Mother    Thyroid disease Mother    Depression Mother    Anxiety disorder Mother    Drug abuse Mother    Obesity Mother    Alcohol abuse Father    Depression Daughter    Breast cancer Neg Hx     Social History Social History   Tobacco Use   Smoking status: Former    Current packs/day: 0.00    Average packs/day: 1 pack/day for 10.0 years (10.0 ttl pk-yrs)    Types:  Cigarettes    Start date: 07/12/1992    Quit date: 07/12/2002    Years since quitting: 20.8   Smokeless tobacco: Never   Tobacco comments:    quit 2004  Vaping Use   Vaping status: Never Used  Substance Use Topics   Alcohol use: Yes    Alcohol/week: 0.0 standard drinks of alcohol    Comment: rarely   Drug use: No     Allergies   Hctz [hydrochlorothiazide], Lisinopril, Latex, and Prednisone   Review of Systems Review of Systems: :negative unless otherwise stated in HPI.      Physical Exam Triage Vital Signs ED Triage Vitals  Encounter Vitals Group     BP      Systolic BP Percentile      Diastolic BP Percentile      Pulse      Resp      Temp      Temp src      SpO2      Weight      Height      Head Circumference      Peak Flow      Pain Score      Pain Loc      Pain Education      Exclude from Growth Chart    No data found.  Updated Vital Signs BP (!) 141/97 (BP Location: Left Arm)   Pulse 75   Temp 98.4 F (36.9 C) (Oral)   Resp 14   Ht 5\' 6"  (1.676 m)   Wt 105.1 kg   SpO2 100%   BMI 37.40 kg/m   Visual Acuity Right Eye Distance:   Left Eye Distance:   Bilateral Distance:    Right Eye Near:   Left Eye Near:    Bilateral Near:     Physical Exam GEN: well appearing  female in no acute distress  CVS: well perfused  RESP: speaking in full sentences without pause, no respiratory distress  MSK: Left ankle: Inspection: +erythema, +moderate lateral edema, +ecchymosis  but no bony deformity, no bone pes planus or cavus deformity, transverse not intact and medial arche intact Palpation: Tenderness of the lateral malleolus, anterior to the lateral malleolus ROM: Full  passive range of motion but with pain  Strength: 5/5 in all directions No ligamentous laxity No pain at the base of the fifth metatarsal Unable to ambulate  without pain  Special Tests: anterior and posterior drawer  Left Knee Exam -Inspection: no deformity, abrasion  -Palpation:  medial and lateral joint line tenderness -ROM: Extension: 0 degrees; Flexion: 140 degrees.   -Special Tests: Varus Stress: Negative; Valgus Stress: Negative; Patellar grind: Negative -Limb neurovascularly intact, no instability noted   UC Treatments / Results  Labs (all labs ordered are listed, but only abnormal results are displayed) Labs Reviewed - No data to display  EKG   Radiology DG Ankle Complete Left  Result Date: 05/29/2023 CLINICAL DATA:  LEFT ankle pain and swelling EXAM: LEFT ANKLE COMPLETE - 3+ VIEW COMPARISON:  None Available. FINDINGS: Ankle mortise intact. The talar dome is normal. No malleolar fracture. The calcaneus is normal. Enthesopathic spurring along the plantar aspect of the calcaneus. IMPRESSION: No fracture or dislocation. Electronically Signed   By: Genevive Bi M.D.   On: 05/29/2023 08:47     Procedures Procedures (including critical care time)  Medications Ordered in UC Medications - No data to display  Initial Impression / Assessment and Plan / UC Course  I have reviewed the triage vital signs and the nursing notes.  Pertinent labs & imaging results that were available during my care of the patient were reviewed by me and considered in my medical decision making (see chart for details).      Pt is a 52 y.o.  female with acute left knee and ankle pain after a fall at home this morning. On exam, pt has tenderness and edema at lateral malleolus concerning for fracture.   Obtained  left ankle plain films.  Personally interpreted by me were unremarkable for fracture or dislocation. Radiologist report reviewed. Placed in air cast and fitted for crutches prior to discharge.  Patient to gradually return to normal activities, as tolerated and continue ordinary activities within the limits permitted by pain. Prescribed Naproxen sodium for pain relief.  Tylenol PRN. Advised patient to avoid OTC NSAIDs while taking prescription NSAID.   Patient to follow  up with orthopedic provider, if symptoms do not improve with conservative treatment.  Return and ED precautions given. Understanding voiced. Discussed MDM, treatment plan and plan for follow-up with patient/parent who agrees with plan.   Final Clinical Impressions(s) / UC Diagnoses   Final diagnoses:  Sprain of anterior talofibular ligament of left ankle, initial encounter  Fall in home, initial encounter     Discharge Instructions      If medication was prescribed, stop by the pharmacy to pick up your prescriptions.  For your  pain, Take 1500 mg Tylenol twice a day,  take Naprosyn twice a day,  as needed for pain.  Wear your ankle brace. Rest and elevate the affected painful area.  Apply cold compresses intermittently, as needed.  As pain recedes, begin normal activities slowly as tolerated.  Follow up with primary care provider or an orthopedic provider, if symptoms persist.  Watch for worsening symptoms such as an increasing weakness or  loss of sensation, increasing pain and/or the loss of bladder or bowel function. Should any of these occur, go to the emergency department immediately.        ED Prescriptions     Medication Sig Dispense Auth. Provider   naproxen (NAPROSYN) 500 MG tablet Take 1 tablet (500 mg total) by mouth 2 (two) times daily with a meal. 30 tablet Rona Tomson, DO      PDMP not reviewed this encounter.   Katha Cabal, DO 05/29/23 0901

## 2023-05-30 ENCOUNTER — Ambulatory Visit: Payer: Managed Care, Other (non HMO) | Admitting: Nurse Practitioner

## 2023-06-01 ENCOUNTER — Ambulatory Visit (INDEPENDENT_AMBULATORY_CARE_PROVIDER_SITE_OTHER): Payer: Managed Care, Other (non HMO) | Admitting: Psychology

## 2023-06-01 DIAGNOSIS — F309 Manic episode, unspecified: Secondary | ICD-10-CM | POA: Diagnosis not present

## 2023-06-01 NOTE — Progress Notes (Signed)
[facility] Counselor/Therapist Progress Note  Patient ID: Lori Knight, MRN: [id number]   Date: 06/01/23  Time Spent: 8:05 am - 8:47 am : 42 Minutes  Treatment Type: Individual Therapy.  Reported Symptoms: Bipolar, depression, and anxiety.   Mental Status Exam: Appearance:  Casual     Behavior: Appropriate  Motor: Normal  Speech/Language:  Clear and Coherent  Affect: Congruent  Mood: anxious  Thought process: normal  Thought content:   WNL  Sensory/Perceptual disturbances:   WNL  Orientation: oriented to person, place, time/date, and situation  Attention: Good  Concentration: Good  Memory: WNL  Fund of knowledge:  Good  Insight:   Good  Judgment:  Good  Impulse Control: Good   Risk Assessment: Danger to Self:  No Self-injurious Behavior: No Danger to Others: No Duty to Warn:no Physical Aggression / Violence:No  Access to Firearms a concern: No  Gang Involvement:No   Subjective:   [name] participated from home, via video and consented to treatment. Therapist participated from home office. Zanyah noted a recent injury to her ankle and discussed the effect of this on her functioning and mood. She noted following up with the coworker to discuss unaddressed concerns, advocate for self, and communicate assertively. She noted working on managing her approach and frustration during this conversation. Therapist praised Angela for her efforts in this area. She noted wearing her prescription sunglasses positively affected the interaction due to a "lack of poker face". She noted a need to work on not allowing things to build-up. She endorsed rumination and therapist highlighted catastrophizing. She noted triggers for rumination including not being valued, not feeling good enough, unfair, expectations, playing out dynamics, habit, lack of mindfulness. She noted her reaction to ruminating and stress include "get short", difficulty focusing, stomach discomfort,  avoiding tasks (business task), bp and hrt rate. Therapist encouraged Shekita to create a list of triggers and reactions to rumination and higher levels of stress. Psycho-education regarding rumination and the effects of this on mood and physiological tension.  Therapist introduced developing worry time and provided in depth psychoeducation regarding the benefits of and how to integrate this tool.  Therapist modeled this during the session and provided feedback to Grimes regarding how to enact this.  Aldina was engaged and motivated during the session and expressed commitment towards her goals.  Therapist praised Renae Fickle for her effort and energy and provided supportive therapy.  Follow-up was scheduled for continued treatment.  Interventions: CBT and interpersonal  Diagnosis:  Bipolar I disorder, single manic episode (HCC)  Manic disorder, single episode [facility])  Psychiatric Treatment: Yes , Christell Faith, M.D. ([facility])    Treatment Plan:  Client Abilities/Strengths Zoiey is intelligent, consistent, and motivated for change.   Support System: Family and friends.   Client Treatment Preferences Outpatient Therapy.   Client Statement of Needs Trinati would like to process events, bolster coping skills, reduce negative self-talk,  improve frustration tolerance, verbalizing thoughts and feelings, being more mindful, self-care, working through [name], managing cognitive distortions (jumps to conclusions), Identify areas of control and lack of control, and navigate interpersonal stressors.   Treatment Level Weekly  Symptoms  Depression:  feeling down, hypersomia, lethargy, difficulty concentrating.  (Status: maintained) Anxiety: feeling anxious, difficulty managing worry, worrying about different things, irritability, and feeling afraid something awful might happen .   (Status: maintained)  Goals:   [name] experiences symptoms of   Treatment plan signed and available on s-drive:  No,  pending signature.    Target Date:  03/27/24 Frequency: Weekly  Progress: 0 Modality: individual    Therapist will provide referrals for additional resources as appropriate.  Therapist will provide psycho-education regarding Laree's diagnosis and corresponding treatment approaches and interventions. Licensed Clinical Social Worker, [place], LCSW will support the patient's ability to achieve the goals identified. will employ CBT, BA, Problem-solving, Solution Focused, Mindfulness,  coping skills, & other evidenced-based practices will be used to promote progress towards healthy functioning to help manage decrease symptoms associated with her diagnosis.   Reduce overall level, frequency, and intensity of the feelings of depression, anxiety and panic evidenced by decreased overall symptoms from 6 to 7 days/week to 0 to 1 days/week per client report for at least 3 consecutive months. Verbally express understanding of the relationship between feelings of depression, anxiety and their impact on thinking patterns and behaviors. Verbalize an understanding of the role that distorted thinking plays in creating fears, excessive worry, and ruminations.  [name] participated in the creation of the treatment plan)   Delight Ovens, LCSW

## 2023-06-04 NOTE — Telephone Encounter (Signed)
Free Text [facility] in Care Team.   Letter still needs to be sent.

## 2023-06-17 ENCOUNTER — Encounter: Payer: Self-pay | Admitting: Nurse Practitioner

## 2023-06-17 NOTE — Telephone Encounter (Signed)
 Care team updated and letter sent for eye exam notes.

## 2023-06-30 ENCOUNTER — Telehealth: Payer: Commercial Managed Care - HMO | Admitting: Nurse Practitioner

## 2023-06-30 DIAGNOSIS — R3989 Other symptoms and signs involving the genitourinary system: Secondary | ICD-10-CM

## 2023-06-30 MED ORDER — CEPHALEXIN 500 MG PO CAPS: 500.0000 mg | ORAL_CAPSULE | Freq: Two times a day (BID) | ORAL | 0 refills | Status: AC

## 2023-06-30 NOTE — Progress Notes (Signed)
Virtual Visit Consent   Lori Knight, you are scheduled for a virtual visit with a [facility] provider today. Just as with appointments in the office, your consent must be obtained to participate. Your consent will be active for this visit and any virtual visit you may have with one of our providers in the next 365 days. If you have a MyChart account, a copy of this consent can be sent to you electronically.  As this is a virtual visit, video technology does not allow for your provider to perform a traditional examination. This may limit your provider's ability to fully assess your condition. If your provider identifies any concerns that need to be evaluated in person or the need to arrange testing (such as labs, EKG, etc.), we will make arrangements to do so. Although advances in technology are sophisticated, we cannot ensure that it will always work on either your end or our end. If the connection with a video visit is poor, the visit may have to be switched to a telephone visit. With either a video or telephone visit, we are not always able to ensure that we have a secure connection.  By engaging in this virtual visit, you consent to the provision of healthcare and authorize for your insurance to be billed (if applicable) for the services provided during this visit. Depending on your insurance coverage, you may receive a charge related to this service.  I need to obtain your verbal consent now. Are you willing to proceed with your visit today? Ronny Reichelt has provided verbal consent on 06/30/2023 for a virtual visit (video or telephone). Viviano Simas, FNP  Date: 06/30/2023 4:19 PM  Virtual Visit via Video Note   I, Viviano Simas, connected with  Lori Knight  ([id number], [birth year]) on 06/30/23 at  4:30 PM EST by a video-enabled telemedicine application and verified that I am speaking with the correct person using two identifiers.  Location: Patient: Virtual Visit Location Patient:  Home Provider: Virtual Visit Location Provider: Home Office   I discussed the limitations of evaluation and management by telemedicine and the availability of in person appointments. The patient expressed understanding and agreed to proceed.    History of Present Illness: Lori Knight is a 52 y.o. who identifies as a female who was assigned female at birth, and is being seen today for symptoms of a UTI  She has been experiencing urinary frequency and burning with urination  She feels urgency with sitting or pressure on her bladder   Denies fever   She has been dealing with diarrhea and constipation she believes is secondary to her recently starting [facility]   Has an appointment with her PCP to discuss this  Problems:  Patient Active Problem List   Diagnosis Date Noted   Lateral epicondylitis 05/13/2023   Right shoulder pain 12/02/2022   Macromastia 03/04/2022   Chronic midline low back pain without sciatica 01/15/2022   Hemorrhoids, external 12/02/2021   Aortic atherosclerosis (HCC) 09/30/2020   Type 2 diabetes mellitus with proteinuria (HCC) 02/20/2020   Meningioma (HCC) 12/24/2019   Hyperlipidemia associated with type 2 diabetes mellitus (HCC) 08/13/2019   Gastroesophageal reflux disease without esophagitis 06/20/2019   Type 2 diabetes mellitus with morbid obesity (HCC) 12/01/2015   OSA on CPAP 09/22/2015   Internal hemorrhoids 08/28/2015   Vitamin D deficiency 08/28/2015   Obesity 08/27/2015   Hypertension associated with diabetes (HCC) 02/14/2014   Benign paroxysmal positional nystagmus 02/14/2014   Benign neoplasm of kidney 08/14/2012  Bipolar I disorder, single manic episode (HCC) 08/14/2012   Hashimoto's thyroiditis 06/21/2012   Bilateral polycystic ovarian syndrome 06/21/2012    Allergies:  Allergies  Allergen Reactions   Hctz [Hydrochlorothiazide] Cough   Lisinopril Cough   Latex Rash    Condoms only   Prednisone Anxiety    Paranoia   Medications:   Current Outpatient Medications:    acetaminophen (TYLENOL) 500 MG tablet, Take 500 mg by mouth every 6 (six) hours as needed., Disp: , Rfl:    amLODipine (NORVASC) 5 MG tablet, Take 1 tablet (5 mg total) by mouth daily., Disp: 90 tablet, Rfl: 4   Ascorbic Acid (VITAMIN C) 1000 MG tablet, Take 1,000 mg by mouth daily., Disp: , Rfl:    Cholecalciferol 5000 units capsule, Take 1 capsule (5,000 Units total) by mouth daily., Disp: , Rfl:    EPINEPHrine 0.3 mg/0.3 mL IJ SOAJ injection, Inject 0.3 mg into the muscle as needed for anaphylaxis., Disp: 1 each, Rfl: 4   glucose blood (CONTOUR NEXT TEST) test strip, USE TO CHECK BS UP TO 3 TIMES DAILY FOR DIABETES DX: E11.9, Disp: 100 strip, Rfl: 12   ipratropium (ATROVENT) 0.06 % nasal spray, Place 2 sprays into both nostrils 4 (four) times daily., Disp: 15 mL, Rfl: 12   lamoTRIgine (LAMICTAL) 150 MG tablet, Take 150 mg by mouth 2 (two) times daily., Disp: , Rfl:    levothyroxine (SYNTHROID) 150 MCG tablet, Take 1 tablet (150 mcg total) by mouth daily., Disp: 90 tablet, Rfl: 3   losartan (COZAAR) 50 MG tablet, Take 2 tablets (100 mg total) by mouth daily., Disp: 180 tablet, Rfl: 4   metFORMIN (GLUCOPHAGE) 500 MG tablet, Take 1 tablet (500 mg total) by mouth 2 (two) times daily., Disp: 180 tablet, Rfl: 4   mometasone (ELOCON) 0.1 % cream, SMARTSIG:1 Application Topical, Disp: , Rfl:    naproxen (NAPROSYN) 500 MG tablet, Take 1 tablet (500 mg total) by mouth 2 (two) times daily with a meal., Disp: 30 tablet, Rfl: 0   nystatin (MYCOSTATIN/NYSTOP) powder, Apply 1 application. topically 3 (three) times daily., Disp: 15 g, Rfl: 4   Omega-3 Fatty Acids (FISH OIL) 1200 MG CAPS, Take 1,200 mg by mouth daily. , Disp: , Rfl:    omeprazole (PRILOSEC) 40 MG capsule, TAKE 1 CAPSULE BY MOUTH EVERY DAY, Disp: 90 capsule, Rfl: 4   rosuvastatin (CRESTOR) 40 MG tablet, Take 1 tablet (40 mg total) by mouth daily., Disp: 90 tablet, Rfl: 4   tirzepatide (MOUNJARO) 10 MG/0.5ML  Pen, Inject 10 mg into the skin once a week., Disp: 6 mL, Rfl: 4  Observations/Objective: Patient is well-developed, well-nourished in no acute distress.  Resting comfortably  at home.  Head is normocephalic, atraumatic.  No labored breathing.  Speech is clear and coherent with logical content.  Patient is alert and oriented at baseline.    Assessment and Plan:  1. Suspected UTI (Primary)  Push fluids and rest    - cephALEXin (KEFLEX) 500 MG capsule; Take 1 capsule (500 mg total) by mouth 2 (two) times daily for 7 days.  Dispense: 14 capsule; Refill: 0     Follow Up Instructions: I discussed the assessment and treatment plan with the patient. The patient was provided an opportunity to ask questions and all were answered. The patient agreed with the plan and demonstrated an understanding of the instructions.  A copy of instructions were sent to the patient via MyChart unless otherwise noted below.    The patient was  advised to call back or seek an in-person evaluation if the symptoms worsen or if the condition fails to improve as anticipated.    Viviano Simas, FNP

## 2023-07-09 NOTE — Patient Instructions (Incomplete)
Start Metamucil daily (gummies or powder) and then take Senna S only as needed.  Constipation, Adult Constipation is when a person has trouble pooping (having a bowel movement). When you have this condition, you may poop fewer than 3 times a week. Your poop (stool) may also be dry, hard, or bigger than normal. Follow these instructions at home: Eating and drinking  Eat foods that have a lot of fiber, such as: Fresh fruits and vegetables. Whole grains. Beans. Eat less of foods that are low in fiber and high in fat and sugar, such as: [place] fries. Hamburgers. Cookies. Candy. Soda. Drink enough fluid to keep your pee (urine) pale yellow. General instructions Exercise regularly or as told by your doctor. Try to do 150 minutes of exercise each week. Go to the restroom when you feel like you need to poop. Do not hold it in. Take over-the-counter and prescription medicines only as told by your doctor. These include any fiber supplements. When you poop: Do deep breathing while relaxing your lower belly (abdomen). Relax your pelvic floor. The pelvic floor is a group of muscles that support the rectum, bladder, and intestines (as well as the uterus in women). Watch your condition for any changes. Tell your doctor if you notice any. Keep all follow-up visits as told by your doctor. This is important. Contact a doctor if: You have pain that gets worse. You have a fever. You have not pooped for 4 days. You vomit. You are not hungry. You lose weight. You are bleeding from the opening of the butt (anus). You have thin, pencil-like poop. Get help right away if: You have a fever, and your symptoms suddenly get worse. You leak poop or have blood in your poop. Your belly feels hard or bigger than normal (bloated). You have very bad belly pain. You feel dizzy or you faint. Summary Constipation is when a person poops fewer than 3 times a week, has trouble pooping, or has poop that is dry,  hard, or bigger than normal. Eat foods that have a lot of fiber. Drink enough fluid to keep your pee (urine) pale yellow. Take over-the-counter and prescription medicines only as told by your doctor. These include any fiber supplements. This information is not intended to replace advice given to you by your health care provider. Make sure you discuss any questions you have with your health care provider. Document Revised: 05/12/2022 Document Reviewed: 05/12/2022 Elsevier Patient Education  2024 [employer].

## 2023-07-11 ENCOUNTER — Encounter: Payer: Self-pay | Admitting: Nurse Practitioner

## 2023-07-11 ENCOUNTER — Ambulatory Visit: Payer: Commercial Managed Care - HMO | Admitting: Psychology

## 2023-07-11 ENCOUNTER — Ambulatory Visit: Payer: Commercial Managed Care - HMO | Admitting: Nurse Practitioner

## 2023-07-11 VITALS — BP 129/76 | HR 68 | Temp 98.1°F | Ht 66.0 in | Wt 234.0 lb

## 2023-07-11 DIAGNOSIS — K5903 Drug induced constipation: Secondary | ICD-10-CM | POA: Diagnosis not present

## 2023-07-11 DIAGNOSIS — F309 Manic episode, unspecified: Secondary | ICD-10-CM

## 2023-07-11 DIAGNOSIS — K5900 Constipation, unspecified: Secondary | ICD-10-CM | POA: Insufficient documentation

## 2023-07-11 DIAGNOSIS — R52 Pain, unspecified: Secondary | ICD-10-CM | POA: Diagnosis not present

## 2023-07-11 MED ORDER — ONDANSETRON 4 MG PO TBDP: 4.0000 mg | ORAL_TABLET | Freq: Three times a day (TID) | ORAL | 1 refills | Status: DC | PRN

## 2023-07-11 NOTE — Assessment & Plan Note (Signed)
Acute starting today.  Will obtain Covid testing, but discussed with her it may be too early.  She is to retest in 2 to 3 days if worsening or ongoing symptoms.

## 2023-07-11 NOTE — Assessment & Plan Note (Signed)
Related to [facility].  Present for months.  Have recommended she start a regular bowel regimen with Metamucil gummies or powder daily as instructed on bottle and Senna S as needed.  Ensure plenty of water intake daily and increased fiber in diet.  Goal is daily BM without straining, but also no diarrhea.  Monitor closely and adjust regimen as needed.  She has passed bowels well today.

## 2023-07-11 NOTE — Progress Notes (Signed)
BP 129/76   Pulse 68   Temp 98.1 F (36.7 C) (Oral)   Ht 5\' 6"  (1.676 m)   Wt 234 lb (106.1 kg)   SpO2 98%   BMI 37.77 kg/m    Subjective:    Patient ID: Lori Knight, female    DOB: 17-Oct-1970, 52 y.o.   MRN: [id number]  HPI: [name] is a 52 y.o. female  Chief Complaint  Patient presents with   Constipation    Patient states she has been having issues with constipation for several weeks. States she has tried several OTC medications and changed her foods as well. States she took 2 colace last night and has been using the bathroom all day. States she has been nauseated all day as well and just not feeling well. States she thinks her mounjaro may be causing her issue.    CONSTIPATION  Presents for constipation, but currently is also not feeling well, which started today. Andre Lefort for her diabetes.  Constipation has been present for 3-4 weeks.  Prior to [facility] had BM twice a day, but consistency has changed since [place] started -- more hard stool.  She has been swapping Trulicity and Mounjaro to help hold this until new year.  Currently has a BM 2 times a day, but has been straining.    Today started to have nausea and BM all day.  Has had nausea on and off for two weeks though she reports.  Takes Zofran as needed.  Has felt bad today -- chills.   Duration:months Onset: gradual Status: fluctuating Treatments attempted: Colace 1-3 tablets a day, a lot of caffeine Fever: no Nausea: yes Vomiting: no Weight loss: no Decreased appetite: no Diarrhea: no Constipation: yes Blood in stool: no Heartburn: no Jaundice: no Rash: no Dysuria/urinary frequency: no Hematuria: no  Relevant past medical, surgical, family and social history reviewed and updated as indicated. Interim medical history since our last visit reviewed. Allergies and medications reviewed and updated.  Review of Systems  Constitutional:  Negative for activity change, appetite change,  diaphoresis, fatigue and fever.  HENT:  Negative for congestion, ear discharge, ear pain, postnasal drip, rhinorrhea, sinus pressure, sneezing and sore throat.   Respiratory:  Negative for cough, chest tightness, shortness of breath and wheezing.   Cardiovascular:  Negative for chest pain, palpitations and leg swelling.  Gastrointestinal:  Positive for abdominal distention, anal bleeding (only when strains), constipation and nausea. Negative for abdominal pain, blood in stool, diarrhea and vomiting.  Musculoskeletal:  Positive for myalgias.  Neurological:  Positive for headaches.   Per HPI unless specifically indicated above     Objective:    BP 129/76   Pulse 68   Temp 98.1 F (36.7 C) (Oral)   Ht 5\' 6"  (1.676 m)   Wt 234 lb (106.1 kg)   SpO2 98%   BMI 37.77 kg/m   Wt Readings from Last 3 Encounters:  07/11/23 234 lb (106.1 kg)  05/29/23 231 lb 11.3 oz (105.1 kg)  05/13/23 231 lb 9.6 oz (105.1 kg)    Physical Exam Vitals and nursing note reviewed.  Constitutional:      General: She is awake. She is not in acute distress.    Appearance: She is well-developed and well-groomed. She is obese. She is not ill-appearing or toxic-appearing.  HENT:     Head: Normocephalic.     Right Ear: Hearing, tympanic membrane, ear canal and external ear normal. No middle ear effusion. There is no impacted cerumen.  Tympanic membrane is not injected.     Left Ear: Hearing, tympanic membrane, ear canal and external ear normal.  No middle ear effusion. There is no impacted cerumen. Tympanic membrane is not injected.     Nose: Rhinorrhea present. Rhinorrhea is clear.     Right Sinus: No maxillary sinus tenderness or frontal sinus tenderness.     Left Sinus: No maxillary sinus tenderness or frontal sinus tenderness.     Mouth/Throat:     Mouth: Mucous membranes are moist.     Pharynx: Posterior oropharyngeal erythema (mild) present. No pharyngeal swelling or oropharyngeal exudate.  Eyes:      General: Lids are normal.        Right eye: No discharge.        Left eye: No discharge.     Conjunctiva/sclera: Conjunctivae normal.     Pupils: Pupils are equal, round, and reactive to light.  Neck:     Thyroid: No thyromegaly.     Vascular: No carotid bruit.  Cardiovascular:     Rate and Rhythm: Normal rate and regular rhythm.     Heart sounds: Normal heart sounds. No murmur heard.    No gallop.  Pulmonary:     Effort: Pulmonary effort is normal. No accessory muscle usage or respiratory distress.     Breath sounds: Normal breath sounds. No decreased breath sounds, wheezing or rhonchi.  Abdominal:     General: Bowel sounds are normal. There is distension (very mild).     Palpations: Abdomen is soft. There is no mass.     Tenderness: There is generalized abdominal tenderness.  Musculoskeletal:     Cervical back: Normal range of motion and neck supple.     Right lower leg: No edema.     Left lower leg: No edema.  Lymphadenopathy:     Cervical: No cervical adenopathy.  Skin:    General: Skin is warm and dry.  Neurological:     Mental Status: She is alert and oriented to person, place, and time.     Deep Tendon Reflexes: Reflexes are normal and symmetric.     Reflex Scores:      Brachioradialis reflexes are 2+ on the right side and 2+ on the left side.      Patellar reflexes are 2+ on the right side and 2+ on the left side. Psychiatric:        Attention and Perception: Attention normal.        Mood and Affect: Mood normal.        Speech: Speech normal.        Behavior: Behavior normal. Behavior is cooperative.        Thought Content: Thought content normal.    Results for orders placed or performed in visit on 04/26/23  TSH   Collection Time: 04/26/23  1:34 PM  Result Value Ref Range   TSH 0.506 0.450 - 4.500 uIU/mL  T4, free   Collection Time: 04/26/23  1:34 PM  Result Value Ref Range   Free T4 1.47 0.82 - 1.77 ng/dL      Assessment & Plan:   Problem List Items  Addressed This Visit       Other   Body aches   Acute starting today.  Will obtain Covid testing, but discussed with her it may be too early.  She is to retest in 2 to 3 days if worsening or ongoing symptoms.      Relevant Orders   Novel Coronavirus, NAA ([facility])  Constipation - Primary   Related to [facility].  Present for months.  Have recommended she start a regular bowel regimen with Metamucil gummies or powder daily as instructed on bottle and Senna S as needed.  Ensure plenty of water intake daily and increased fiber in diet.  Goal is daily BM without straining, but also no diarrhea.  Monitor closely and adjust regimen as needed.  She has passed bowels well today.        Follow up plan: Return for as scheduled in February.

## 2023-07-11 NOTE — Progress Notes (Signed)
[facility] Counselor/Therapist Progress Note  Patient ID: Lori Knight, MRN: [id number]   Date: 07/11/23  Time Spent: 2:33 pm - 3:04 pm : 31 Minutes  Treatment Type: Individual Therapy.  Reported Symptoms: Bipolar, depression, and anxiety.   Mental Status Exam: Appearance:  Casual     Behavior: Appropriate  Motor: Normal  Speech/Language:  Clear and Coherent  Affect: Congruent  Mood: anxious  Thought process: normal  Thought content:   WNL  Sensory/Perceptual disturbances:   WNL  Orientation: oriented to person, place, time/date, and situation  Attention: Good  Concentration: Good  Memory: WNL  Fund of knowledge:  Good  Insight:   Good  Judgment:  Good  Impulse Control: Good   Risk Assessment: Danger to Self:  No Self-injurious Behavior: No Danger to Others: No Duty to Warn:no Physical Aggression / Violence:No  Access to Firearms a concern: No  Gang Involvement:No   Subjective:   Lori Knight participated from home, via video and consented to treatment. Therapist participated from office. Lori Knight noted feeling lethargic and noted this being slated to see her PCP today. She noted the possibility of this being due to her [facility] prescription. She noted work related stressors and noted her efforts to be mindful of her interactions of the result. We processed these recent stressors and therapist praised Lori Knight for her mindfulness and purposefulness towards how she interacts with others. We worked on processing her frustrations and expectations in this situation.  She noted improvement in her daughter's mood, overall, which appears to be reflected in her functioning. Therapist praised Lori Knight for her effort and energy during the session. Therapist encouraged self-care and mindfulness of mood and interpersonal interactions. Lori Knight elected to end the session early due to a doctor's appointment. We scheduled a follow-up for continued care which she benefits from.  Therapist provided supportive therapy.   Interventions: CBT and interpersonal  Diagnosis:  Bipolar I disorder, single manic episode [facility])  Psychiatric Treatment: Yes , Lori Knight, M.D. ([facility])    Treatment Plan:  Client Abilities/Strengths Lori Knight is intelligent, consistent, and motivated for change.   Support System: Family and friends.   Client Treatment Preferences Outpatient Therapy.   Client Statement of Needs Lori Knight would like to process events, bolster coping skills, reduce negative self-talk,  improve frustration tolerance, verbalizing thoughts and feelings, being more mindful, self-care, working through [name], managing cognitive distortions (jumps to conclusions), Identify areas of control and lack of control, and navigate interpersonal stressors.   Treatment Level Weekly  Symptoms  Depression:  feeling down, hypersomia, lethargy, difficulty concentrating.  (Status: maintained) Anxiety: feeling anxious, difficulty managing worry, worrying about different things, irritability, and feeling afraid something awful might happen .   (Status: maintained)  Goals:   Lori Knight experiences symptoms of   Treatment plan signed and available on s-drive:  No, pending signature.    Target Date: 03/27/24 Frequency: Weekly  Progress: 0 Modality: individual    Therapist will provide referrals for additional resources as appropriate.  Therapist will provide psycho-education regarding [name]'s diagnosis and corresponding treatment approaches and interventions. Licensed Clinical Social Worker, [place], LCSW will support the patient's ability to achieve the goals identified. will employ CBT, BA, Problem-solving, Solution Focused, Mindfulness,  coping skills, & other evidenced-based practices will be used to promote progress towards healthy functioning to help manage decrease symptoms associated with her diagnosis.   Reduce overall level, frequency, and intensity of the  feelings of depression, anxiety and panic evidenced by decreased overall symptoms from 6 to  7 days/week to 0 to 1 days/week per client report for at least 3 consecutive months. Verbally express understanding of the relationship between feelings of depression, anxiety and their impact on thinking patterns and behaviors. Verbalize an understanding of the role that distorted thinking plays in creating fears, excessive worry, and ruminations.  Lori Knight participated in the creation of the treatment plan)   Delight Ovens, LCSW

## 2023-07-13 LAB — NOVEL CORONAVIRUS, NAA: SARS-CoV-2, NAA: NOT DETECTED

## 2023-07-21 ENCOUNTER — Ambulatory Visit: Payer: Commercial Managed Care - HMO | Admitting: Nurse Practitioner

## 2023-08-01 ENCOUNTER — Encounter: Payer: Self-pay | Admitting: Nurse Practitioner

## 2023-08-01 ENCOUNTER — Other Ambulatory Visit: Payer: Self-pay

## 2023-08-01 DIAGNOSIS — K219 Gastro-esophageal reflux disease without esophagitis: Secondary | ICD-10-CM

## 2023-08-01 DIAGNOSIS — I152 Hypertension secondary to endocrine disorders: Secondary | ICD-10-CM

## 2023-08-02 MED ORDER — LOSARTAN POTASSIUM 50 MG PO TABS: 100.0000 mg | ORAL_TABLET | Freq: Every day | ORAL | 5 refills | Status: DC

## 2023-08-02 MED ORDER — ROSUVASTATIN CALCIUM 40 MG PO TABS: 40.0000 mg | ORAL_TABLET | Freq: Every day | ORAL | 3 refills | Status: DC

## 2023-08-02 MED ORDER — METFORMIN HCL 500 MG PO TABS: 500.0000 mg | ORAL_TABLET | Freq: Two times a day (BID) | ORAL | 5 refills | Status: DC

## 2023-08-02 MED ORDER — LEVOTHYROXINE SODIUM 150 MCG PO TABS: 150.0000 ug | ORAL_TABLET | Freq: Every day | ORAL | 3 refills | Status: DC

## 2023-08-02 MED ORDER — OMEPRAZOLE 40 MG PO CPDR: DELAYED_RELEASE_CAPSULE | ORAL | 5 refills | Status: DC

## 2023-08-02 MED ORDER — LAMOTRIGINE 150 MG PO TABS: 150.0000 mg | ORAL_TABLET | Freq: Two times a day (BID) | ORAL | 5 refills | Status: DC

## 2023-08-02 MED ORDER — AMLODIPINE BESYLATE 5 MG PO TABS: 5.0000 mg | ORAL_TABLET | Freq: Every day | ORAL | 5 refills | Status: DC

## 2023-08-10 ENCOUNTER — Ambulatory Visit (INDEPENDENT_AMBULATORY_CARE_PROVIDER_SITE_OTHER): Payer: 59 | Admitting: Psychology

## 2023-08-10 DIAGNOSIS — F309 Manic episode, unspecified: Secondary | ICD-10-CM | POA: Diagnosis not present

## 2023-08-10 NOTE — Progress Notes (Signed)
[facility] Counselor/Therapist Progress Note  Patient ID: Lori Knight, MRN: [id number]   Date: 08/10/23  Time Spent: 1:03 pm - 1:37 pm : 34 Minutes  Treatment Type: Individual Therapy.  Reported Symptoms: Bipolar, depression, and anxiety.   Mental Status Exam: Appearance:  Casual     Behavior: Appropriate  Motor: Normal  Speech/Language:  Clear and Coherent  Affect: Congruent  Mood: anxious  Thought process: normal  Thought content:   WNL  Sensory/Perceptual disturbances:   WNL  Orientation: oriented to person, place, time/date, and situation  Attention: Good  Concentration: Good  Memory: WNL  Fund of knowledge:  Good  Insight:   Good  Judgment:  Good  Impulse Control: Good   Risk Assessment: Danger to Self:  No Self-injurious Behavior: No Danger to Others: No Duty to Warn:no Physical Aggression / Violence:No  Access to Firearms a concern: No  Gang Involvement:No   Subjective:   [name] participated from home, via video and consented to treatment. Therapist participated from home office. Florentine noted getting poor sleep due to work being busy and having to address a work related issue. We worked on exploring these stressors and processing her experience. She noted having to set boundaries, at work, despite pressure from work. She noted feeling overwhelmed by work and noted this being exacerbated by having pending work tasks that are past due. 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 She noted working on focusing on the higher priorities and delegating other tasks to others, when able. We worked on processing this recent adjustment in approach and how this affects her mood. Therapist praised Edra for working on managing her stressors, shifting her approach, prioritizing tasks and delegating other tasks. She noted a reduction in her self-care due to current workload. We worked on identifying ways to  engage in self-care and relaxation on a more frequent basis. She noted working on not responding to communication immediately and noted having to manage her distress. Tynleigh was engaged and motivated during the session. She expressed commitment towards goals. Therapist praised [place] and provided supportive therapy. A Follow-up was scheduled for continued treatment.    Interventions: CBT and interpersonal  Diagnosis:  Bipolar I disorder, single manic episode [facility])  Psychiatric Treatment: Yes , Christell Faith, M.D. ([facility])    Treatment Plan:  Client Abilities/Strengths Zamora is intelligent, consistent, and motivated for change.   Support System: Family and friends.   Client Treatment Preferences Outpatient Therapy.   Client Statement of Needs Kikue would like to process events, bolster coping skills, reduce negative self-talk,  improve frustration tolerance, verbalizing thoughts and feelings, being more mindful, self-care, working through [name], managing cognitive distortions (jumps to conclusions), Identify areas of control and lack of control, and navigate interpersonal stressors.   Treatment Level Weekly  Symptoms  Depression:  feeling down, hypersomia, lethargy, difficulty concentrating.  (Status: maintained) Anxiety: feeling anxious, difficulty managing worry, worrying about different things, irritability, and feeling afraid something awful might happen .   (Status: maintained)  Goals:   [name] experiences symptoms of   Treatment plan signed and available on s-drive:  No, pending signature.    Target Date: 03/27/24 Frequency: Weekly  Progress: 0 Modality: individual    Therapist will provide referrals for additional resources as appropriate.  Therapist will provide psycho-education regarding Jahnay's diagnosis and corresponding treatment approaches and interventions. Licensed Clinical Social Worker, Jeff, LCSW will support the patient's ability to  achieve the goals identified. will employ  CBT, BA, Problem-solving, Solution Focused, Mindfulness,  coping skills, & other evidenced-based practices will be used to promote progress towards healthy functioning to help manage decrease symptoms associated with her diagnosis.   Reduce overall level, frequency, and intensity of the feelings of depression, anxiety and panic evidenced by decreased overall symptoms from 6 to 7 days/week to 0 to 1 days/week per client report for at least 3 consecutive months. Verbally express understanding of the relationship between feelings of depression, anxiety and their impact on thinking patterns and behaviors. Verbalize an understanding of the role that distorted thinking plays in creating fears, excessive worry, and ruminations.  [name] participated in the creation of the treatment plan)   Delight Ovens, LCSW

## 2023-08-21 ENCOUNTER — Encounter: Payer: Self-pay | Admitting: Nurse Practitioner

## 2023-08-22 ENCOUNTER — Other Ambulatory Visit: Payer: Self-pay | Admitting: Nurse Practitioner

## 2023-08-22 MED ORDER — TIRZEPATIDE 10 MG/0.5ML ~~LOC~~ SOAJ: 10.0000 mg | SUBCUTANEOUS | 4 refills | Status: DC

## 2023-08-23 NOTE — Telephone Encounter (Signed)
New PA for Mounjaro 10 mg initiated and submitted.

## 2023-08-23 NOTE — Telephone Encounter (Signed)
Pharmacy comment: Alternative Requested:THE PRESCRIBED MEDICATION IS NOT COVERED BY INSURANCE. PLEASE CONSIDER CHANGING TO ONE OF THE SUGGESTED COVERED ALTERNATIVES.

## 2023-08-24 ENCOUNTER — Encounter: Payer: Self-pay | Admitting: Nurse Practitioner

## 2023-08-24 ENCOUNTER — Ambulatory Visit: Payer: 59 | Admitting: Psychology

## 2023-08-24 NOTE — Telephone Encounter (Signed)
PA denied. Appeal letter faxed in for the patient.

## 2023-08-27 DIAGNOSIS — E119 Type 2 diabetes mellitus without complications: Secondary | ICD-10-CM | POA: Insufficient documentation

## 2023-08-27 NOTE — Patient Instructions (Incomplete)
 Please call to schedule your mammogram and/or bone density: [facility] at [facility]  Address: [address], [place], [place] [address] Phone: [phone]  [facility] [address] [place],  [place]  [address] Phone: [phone]   Diabetes Mellitus and Foot Care Diabetes, also called diabetes mellitus, may cause problems with your feet and legs because of poor blood flow (circulation). Poor circulation may make your skin: Become thinner and drier. Break more easily. Heal more slowly. Peel and crack. You may also have nerve damage (neuropathy). This can cause decreased feeling in your legs and feet. This means that you may not notice minor injuries to your feet that could lead to more serious problems. Finding and treating problems early is the best way to prevent future foot problems. How to care for your feet Foot hygiene  Wash your feet daily with warm water and mild soap. Do not use hot water. Then, pat your feet and the areas between your toes until they are fully dry. Do not soak your feet. This can dry your skin. Trim your toenails straight across. Do not dig under them or around the cuticle. File the edges of your nails with an emery board or nail file. Apply a moisturizing lotion or petroleum jelly to the skin on your feet and to dry, brittle toenails. Use lotion that does not contain alcohol and is unscented. Do not apply lotion between your toes. Shoes and socks Wear clean socks or stockings every day. Make sure they are not too tight. Do not wear knee-high stockings. These may decrease blood flow to your legs. Wear shoes that fit well and have enough cushioning. Always look in your shoes before you put them on to be sure there are no objects inside. To break in new shoes, wear them for just a few hours a day. This prevents injuries on your feet. Wounds, scrapes, corns, and calluses  Check your feet daily  for blisters, cuts, bruises, sores, and redness. If you cannot see the bottom of your feet, use a mirror or ask someone for help. Do not cut off corns or calluses or try to remove them with medicine. If you find a minor scrape, cut, or break in the skin on your feet, keep it and the skin around it clean and dry. You may clean these areas with mild soap and water. Do not clean the area with peroxide, alcohol, or iodine. If you have a wound, scrape, corn, or callus on your foot, look at it several times a day to make sure it is healing and not infected. Check for: Redness, swelling, or pain. Fluid or blood. Warmth. Pus or a bad smell. General tips Do not cross your legs. This may decrease blood flow to your feet. Do not use heating pads or hot water bottles on your feet. They may burn your skin. If you have lost feeling in your feet or legs, you may not know this is happening until it is too late. Protect your feet from hot and cold by wearing shoes, such as at the beach or on hot pavement. Schedule a complete foot exam at least once a year or more often if you have foot problems. Report any cuts, sores, or bruises to your health care provider right away. Where to find more information American Diabetes Association: diabetes.org Association of Diabetes Care & Education Specialists: diabeteseducator.org Contact a health care provider if: You have a condition that  increases your risk of infection, and you have any cuts, sores, or bruises on your feet. You have an injury that is not healing. You have redness on your legs or feet. You feel burning or tingling in your legs or feet. You have pain or cramps in your legs and feet. Your legs or feet are numb. Your feet always feel cold. You have pain around any toenails. Get help right away if: You have a wound, scrape, corn, or callus on your foot and: You have signs of infection. You have a fever. You have a red line going up your leg. This  information is not intended to replace advice given to you by your health care provider. Make sure you discuss any questions you have with your health care provider. Document Revised: 12/30/2021 Document Reviewed: 12/30/2021 Elsevier Patient Education  2024 [employer].

## 2023-08-29 ENCOUNTER — Ambulatory Visit (INDEPENDENT_AMBULATORY_CARE_PROVIDER_SITE_OTHER): Payer: 59 | Admitting: Nurse Practitioner

## 2023-08-29 ENCOUNTER — Encounter: Payer: Self-pay | Admitting: Nurse Practitioner

## 2023-08-29 VITALS — BP 124/74 | HR 83 | Temp 97.7°F | Ht 66.0 in | Wt 230.4 lb

## 2023-08-29 DIAGNOSIS — Z7985 Long-term (current) use of injectable non-insulin antidiabetic drugs: Secondary | ICD-10-CM | POA: Diagnosis not present

## 2023-08-29 DIAGNOSIS — Z1231 Encounter for screening mammogram for malignant neoplasm of breast: Secondary | ICD-10-CM

## 2023-08-29 DIAGNOSIS — I152 Hypertension secondary to endocrine disorders: Secondary | ICD-10-CM | POA: Diagnosis not present

## 2023-08-29 DIAGNOSIS — E119 Type 2 diabetes mellitus without complications: Secondary | ICD-10-CM

## 2023-08-29 DIAGNOSIS — I700 Atherosclerosis of aorta: Secondary | ICD-10-CM

## 2023-08-29 DIAGNOSIS — E1159 Type 2 diabetes mellitus with other circulatory complications: Secondary | ICD-10-CM | POA: Diagnosis not present

## 2023-08-29 DIAGNOSIS — F309 Manic episode, unspecified: Secondary | ICD-10-CM

## 2023-08-29 DIAGNOSIS — Z23 Encounter for immunization: Secondary | ICD-10-CM

## 2023-08-29 DIAGNOSIS — E1169 Type 2 diabetes mellitus with other specified complication: Secondary | ICD-10-CM | POA: Diagnosis not present

## 2023-08-29 DIAGNOSIS — D329 Benign neoplasm of meninges, unspecified: Secondary | ICD-10-CM

## 2023-08-29 DIAGNOSIS — E1129 Type 2 diabetes mellitus with other diabetic kidney complication: Secondary | ICD-10-CM

## 2023-08-29 DIAGNOSIS — E063 Autoimmune thyroiditis: Secondary | ICD-10-CM | POA: Diagnosis not present

## 2023-08-29 DIAGNOSIS — E785 Hyperlipidemia, unspecified: Secondary | ICD-10-CM | POA: Diagnosis not present

## 2023-08-29 DIAGNOSIS — R809 Proteinuria, unspecified: Secondary | ICD-10-CM

## 2023-08-29 LAB — BAYER DCA HB A1C WAIVED: HB A1C (BAYER DCA - WAIVED): 6 % — ABNORMAL HIGH (ref 4.8–5.6)

## 2023-08-29 LAB — MICROALBUMIN, URINE WAIVED
Creatinine, Urine Waived: 200 mg/dL (ref 10–300)
Microalb, Ur Waived: 80 mg/L — ABNORMAL HIGH (ref 0–19)

## 2023-08-29 NOTE — Assessment & Plan Note (Signed)
 Refer to diabetes with obesity plan of care.

## 2023-08-29 NOTE — Assessment & Plan Note (Signed)
Chronic.  Noted on imaging 03/17/2018.  Recommend continued use of statin daily and consider addition of ASA in future for prevention. 

## 2023-08-29 NOTE — Assessment & Plan Note (Addendum)
Chronic, stable.  Denies SI/HI.  Followed by psychiatry and psychology, will continue this collaboration and current medication regimen as prescribed by them.

## 2023-08-29 NOTE — Assessment & Plan Note (Signed)
Chronic, ongoing.  Continue Levothyroxine 150 MCG daily.  Educated her on this.  Thyroid labs up to date in May 2024.

## 2023-08-29 NOTE — Assessment & Plan Note (Signed)
Followed by neurology, continue collaboration with neurology team and recent notes reviewed. °

## 2023-08-29 NOTE — Assessment & Plan Note (Signed)
Chronic, ongoing with A1c 6% today and urine ALB 80 (February 2025).  She has lost a total of 38 pounds since January 2024.  Continue Losartan for kidney protection.  Tolerating Mounjaro well with improved overall health and weight loss, she wishes to maintain at 10 MG weekly -- working on appeal for this.  Will continue Metformin as ordered.  Educated on BS goals in morning and 2 hours after meals.   - On Statin and ARB - Eye and Foot exams up to date - Vaccinations up to date with exception of Shingrix and Td which she wishes to think about - Return in 3 months.

## 2023-08-29 NOTE — Assessment & Plan Note (Signed)
Chronic, ongoing. BP well below goal.  Continue current medication regimen and adjust as needed.  Focus on DASH diet and regular exercise at home.  Recommend she check BP at few days a week at home and document for provider.  Losartan for kidney protection, urine ALB 80 February 2025.  LABS: CMP.

## 2023-08-29 NOTE — Progress Notes (Signed)
BP 124/74   Pulse 83   Temp 97.7 F (36.5 C) (Oral)   Ht 5\' 6"  (1.676 m)   Wt 230 lb 6.4 oz (104.5 kg)   SpO2 98%   BMI 37.19 kg/m    Subjective:    Patient ID: Lori Knight, female    DOB: 1971-04-27, 53 y.o.   MRN: [id number]  HPI: Lori Knight is a 53 y.o. female  Chief Complaint  Patient presents with   Depression   Diabetes   Hyperlipidemia   Gastroesophageal Reflux   Hypertension   Hypothyroidism   DIABETES August 6.6% A1c. Taking Metformin 500 MG BID and Mounjaro 10 MG weekly (she is alternating this with Trulicity until end of March, as currently appealing insurance for coverage of Mounjaro as has offered a lot of benefit).  On 07/21/22 she weighed 268 lbs, has lost a total of 38 lbs since last year, with Mounjaro on board.  Does have constipation, uses fiber which helps at times.   Hypoglycemic episodes:no Polydipsia/polyuria: no Visual disturbance: no Chest pain: no Paresthesias: no Glucose Monitoring: no             Accucheck frequency: every morning             Fasting glucose: average 135, often 120 range             Post prandial:              Evening:             Before meals: Taking Insulin?: no             Long acting insulin:             Short acting insulin: Blood Pressure Monitoring: not checking Retinal Examination: Not Up to Date -- [facility] in [place] Foot Exam: Up to Date Pneumovax: Up to Date Influenza: Up to Date Aspirin: no   HYPERTENSION / HYPERLIPIDEMIA Continues Losartan 100 MG, Amlodipine 5 MG and Rosuvastatin 40 MG QHS. Echo 2019 noted EF 50-55%.     Sees neurosurgery, Dr. Adriana Simas, for meningioma -- no changes made -- imaging 09/14/22.  This remains stable -- to repeat in 3 years.  Duration of hypertension: chronic BP monitoring frequency: not checking BP range:  BP medication side effects: no Duration of hyperlipidemia: chronic Aspirin: no Recent stressors: no Recurrent headaches: no Visual changes:  no Palpitations: no Dyspnea: no Chest pain: no Lower extremity edema: no Dizzy/lightheaded: occasional The ASCVD Risk score (Arnett DK, et al., 2019) failed to calculate for the following reasons:   Risk score cannot be calculated because patient has a medical history suggesting prior/existing ASCVD   HYPOTHYROIDISM Taking Levothyroxine 150 MCG daily.   Thyroid control status:stable Satisfied with current treatment? yes Medication side effects: no Medication compliance: good compliance Etiology of hypothyroidism:  Recent dose adjustment: yes Fatigue: no Cold intolerance: no Heat intolerance: no -- gets warm flashes Weight gain: no Weight loss: no Constipation: with Mounjaro -- takes fiber Diarrhea/loose stools: no Palpitations: no Lower extremity edema: no Anxiety/depressed mood: no   BIPOLAR DISORDER Her psychiatrist left and she is working on transitioning to new provider.  Taking Lamictal BID.  She is working with psychology, last visit 08/10/23. She is struggling with state of the world -- her husband and her are on separate sides.   Mood status: stable Satisfied with current treatment?: yes Symptom severity: moderate  Duration of current treatment : chronic Side effects: no Medication compliance: good  compliance Psychotherapy/counseling: at present -- with Camila Li Depressed mood: no Anxious mood: yes Anhedonia: no Significant weight loss or gain: no Insomnia: no Fatigue: no Feelings of worthlessness or guilt: no Impaired concentration/indecisiveness: yes Suicidal ideations: no Hopelessness: no Crying spells: no    08/29/2023    9:24 AM 05/13/2023   11:31 AM 03/10/2023    2:04 PM 02/23/2023    2:57 PM 12/02/2022   11:10 AM  Depression screen PHQ 2/9  Decreased Interest 0 1  0 0  Down, Depressed, Hopeless 0 1  0 0  PHQ - 2 Score 0 2  0 0  Altered sleeping 2 0  0 0  Tired, decreased energy 2 2  0 1  Change in appetite 2 0  2 1  Feeling bad or failure about  yourself  0 0  0 0  Trouble concentrating 2 3  0 1  Moving slowly or fidgety/restless 0 0  0 0  Suicidal thoughts 0 0  0 0  PHQ-9 Score 8 7  2 3   Difficult doing work/chores Not difficult at all   Not difficult at all Not difficult at all     Information is confidential and restricted. Go to Review Flowsheets to unlock data.       08/29/2023    9:24 AM 05/13/2023   11:31 AM 03/10/2023    2:05 PM 02/23/2023    2:57 PM  GAD 7 : Generalized Anxiety Score  Nervous, Anxious, on Edge 2 1  1   Control/stop worrying 2 2  2   Worry too much - different things 2 2  2   Trouble relaxing 2 2  1   Restless 2 2  0  Easily annoyed or irritable 2 3  2   Afraid - awful might happen 0 3  0  Total GAD 7 Score 12 15  8   Anxiety Difficulty Not difficult at all   Not difficult at all     Information is confidential and restricted. Go to Review Flowsheets to unlock data.   Relevant past medical, surgical, family and social history reviewed and updated as indicated. Interim medical history since our last visit reviewed. Allergies and medications reviewed and updated.  Review of Systems  Constitutional:  Negative for activity change, appetite change, diaphoresis, fatigue and fever.  Respiratory:  Negative for cough, chest tightness and shortness of breath.   Cardiovascular:  Negative for chest pain, palpitations and leg swelling.  Gastrointestinal: Negative.   Endocrine: Negative for cold intolerance, heat intolerance, polydipsia, polyphagia and polyuria.  Neurological:  Negative for dizziness, tremors, syncope, weakness and headaches.  Psychiatric/Behavioral: Negative.     Per HPI unless specifically indicated above     Objective:    BP 124/74   Pulse 83   Temp 97.7 F (36.5 C) (Oral)   Ht 5\' 6"  (1.676 m)   Wt 230 lb 6.4 oz (104.5 kg)   SpO2 98%   BMI 37.19 kg/m   Wt Readings from Last 3 Encounters:  08/29/23 230 lb 6.4 oz (104.5 kg)  07/11/23 234 lb (106.1 kg)  05/29/23 231 lb 11.3 oz (105.1  kg)    Physical Exam Vitals and nursing note reviewed.  Constitutional:      General: She is awake. She is not in acute distress.    Appearance: She is well-developed and well-groomed. She is obese. She is not ill-appearing or toxic-appearing.  HENT:     Head: Normocephalic.     Right Ear: Hearing and external ear normal.  Left Ear: Hearing and external ear normal.  Eyes:     General: Lids are normal.        Right eye: No discharge.        Left eye: No discharge.     Conjunctiva/sclera: Conjunctivae normal.     Pupils: Pupils are equal, round, and reactive to light.  Neck:     Thyroid: No thyromegaly.     Vascular: No carotid bruit.  Cardiovascular:     Rate and Rhythm: Normal rate and regular rhythm.     Heart sounds: Normal heart sounds. No murmur heard.    No gallop.  Pulmonary:     Effort: Pulmonary effort is normal. No accessory muscle usage or respiratory distress.     Breath sounds: Normal breath sounds. No decreased breath sounds, wheezing or rhonchi.  Abdominal:     General: Bowel sounds are normal. There is no distension.     Palpations: Abdomen is soft.     Tenderness: There is no abdominal tenderness.  Musculoskeletal:     Cervical back: Normal range of motion and neck supple.     Right lower leg: No edema.     Left lower leg: No edema.  Lymphadenopathy:     Cervical: No cervical adenopathy.  Skin:    General: Skin is warm and dry.  Neurological:     Mental Status: She is alert and oriented to person, place, and time.     Deep Tendon Reflexes: Reflexes are normal and symmetric.     Reflex Scores:      Brachioradialis reflexes are 2+ on the right side and 2+ on the left side.      Patellar reflexes are 2+ on the right side and 2+ on the left side. Psychiatric:        Attention and Perception: Attention normal.        Mood and Affect: Mood normal.        Speech: Speech normal.        Behavior: Behavior normal. Behavior is cooperative.        Thought  Content: Thought content normal.    Results for orders placed or performed in visit on 07/11/23  Novel Coronavirus, NAA ([facility])   Collection Time: 07/11/23  4:04 PM   Specimen: Nasopharyngeal(NP) swabs in vial transport medium  Result Value Ref Range   SARS-CoV-2, NAA Not Detected Not Detected      Assessment & Plan:   Problem List Items Addressed This Visit       Cardiovascular and Mediastinum   Aortic atherosclerosis (HCC)   Chronic.  Noted on imaging 03/17/2018.  Recommend continued use of statin daily and consider addition of ASA in future for prevention.      Relevant Orders   Comprehensive metabolic panel   Lipid Panel w/o Chol/HDL Ratio   Hypertension associated with diabetes (HCC)   Chronic, ongoing. BP well below goal.  Continue current medication regimen and adjust as needed.  Focus on DASH diet and regular exercise at home.  Recommend she check BP at few days a week at home and document for provider.  Losartan for kidney protection, urine ALB 80 February 2025.  LABS: CMP.        Relevant Orders   Bayer DCA Hb A1c Waived   Microalbumin, Urine Waived     Endocrine   Diabetes mellitus treated with injections of non-insulin medication (HCC)   Refer to diabetes with obesity plan of care.      Relevant  Orders   Bayer DCA Hb A1c Waived   Comprehensive metabolic panel   Hashimoto's thyroiditis   Chronic, ongoing.  Continue Levothyroxine 150 MCG daily.  Educated her on this.  Thyroid labs up to date in May 2024.      Hyperlipidemia associated with type 2 diabetes mellitus (HCC)   Chronic, ongoing.  Continue on statin which is offering benefit.  Lipid panel today.       Relevant Orders   Bayer DCA Hb A1c Waived   Comprehensive metabolic panel   Lipid Panel w/o Chol/HDL Ratio   Type 2 diabetes mellitus with morbid obesity (HCC)   Chronic, ongoing with A1c 6% today and urine ALB 80 (February 2025).  She has lost a total of 38 pounds since January 2024.  Continue  Losartan for kidney protection.  Tolerating Mounjaro well with improved overall health and weight loss, she wishes to maintain at 10 MG weekly -- working on appeal for this.  Will continue Metformin as ordered.  Educated on BS goals in morning and 2 hours after meals.   - On Statin and ARB - Eye and Foot exams up to date - Vaccinations up to date with exception of Shingrix and Td which she wishes to think about - Return in 3 months.      Relevant Orders   Bayer DCA Hb A1c Waived   Type 2 diabetes mellitus with proteinuria (HCC) - Primary   Chronic, ongoing with A1c 6% today and urine ALB 80 (February 2025).  She has lost a total of 38 pounds since January 2024.  Continue Losartan for kidney protection.  Tolerating Mounjaro well with improved overall health and weight loss, she wishes to maintain at 10 MG weekly -- working on appeal for this.  Will continue Metformin as ordered.  Educated on BS goals in morning and 2 hours after meals.   - On Statin and ARB - Eye and Foot exams up to date - Vaccinations up to date with exception of Shingrix and Td which she wishes to think about - Return in 3 months.      Relevant Orders   Bayer DCA Hb A1c Waived   Microalbumin, Urine Waived   Comprehensive metabolic panel     Nervous and Auditory   Meningioma (HCC)   Followed by neurology, continue collaboration with neurology team and recent notes reviewed.        Other   Bipolar I disorder, single manic episode (HCC)   Chronic, stable.  Denies SI/HI.  Followed by psychiatry and psychology, will continue this collaboration and current medication regimen as prescribed by them.        Other Visit Diagnoses       Encounter for screening mammogram for malignant neoplasm of breast       Mammogram due, ordered and instructed how to schedule.   Relevant Orders   MM 3D SCREENING MAMMOGRAM BILATERAL BREAST        Follow up plan: Return in about 3 months (around 11/26/2023) for T2DM, HTN/HLD,  MOOD.

## 2023-08-29 NOTE — Assessment & Plan Note (Signed)
Chronic, ongoing.  Continue on statin which is offering benefit.  Lipid panel today.

## 2023-08-30 ENCOUNTER — Encounter: Payer: Self-pay | Admitting: Nurse Practitioner

## 2023-08-30 LAB — LIPID PANEL W/O CHOL/HDL RATIO
Cholesterol, Total: 134 mg/dL (ref 100–199)
HDL: 41 mg/dL (ref >39)
LDL Chol Calc (NIH): 69 mg/dL (ref 0–99)
Triglycerides: 139 mg/dL (ref 0–149)
VLDL Cholesterol Cal: 24 mg/dL (ref 5–40)

## 2023-08-30 LAB — COMPREHENSIVE METABOLIC PANEL
ALT: 32 [IU]/L (ref 0–32)
AST: 23 [IU]/L (ref 0–40)
Albumin: 4.4 g/dL (ref 3.8–4.9)
Alkaline Phosphatase: 111 [IU]/L (ref 44–121)
BUN/Creatinine Ratio: 17 (ref 9–23)
BUN: 12 mg/dL (ref 6–24)
Bilirubin Total: 0.2 mg/dL (ref 0.0–1.2)
CO2: 24 mmol/L (ref 20–29)
Calcium: 9.8 mg/dL (ref 8.7–10.2)
Chloride: 106 mmol/L (ref 96–106)
Creatinine, Ser: 0.72 mg/dL (ref 0.57–1.00)
Globulin, Total: 2.4 g/dL (ref 1.5–4.5)
Glucose: 128 mg/dL — ABNORMAL HIGH (ref 70–99)
Potassium: 4.6 mmol/L (ref 3.5–5.2)
Sodium: 144 mmol/L (ref 134–144)
Total Protein: 6.8 g/dL (ref 6.0–8.5)
eGFR: 99 mL/min/{1.73_m2} (ref >59)

## 2023-08-30 NOTE — Progress Notes (Signed)
Contacted via MyChart  Good morning Lori Knight, your labs have returned and overall look good.  Kidney function, creatinine and eGFR, remains normal, as is liver function, AST and ALT. Lipid panel with stable levels.  Any questions? Keep being amazing!!  Thank you for allowing me to participate in your care.  I appreciate you. Kindest regards, Jessamy Torosyan

## 2023-08-31 ENCOUNTER — Encounter: Payer: Self-pay | Admitting: Nurse Practitioner

## 2023-09-03 ENCOUNTER — Other Ambulatory Visit: Payer: Self-pay | Admitting: Nurse Practitioner

## 2023-09-05 NOTE — Telephone Encounter (Signed)
 Requested Prescriptions  Pending Prescriptions Disp Refills   levothyroxine (SYNTHROID) 150 MCG tablet [Pharmacy Med Name: LEVOTHYROXINE 150 MCG TABLET] 90 tablet 0    Sig: TAKE 1 TABLET BY MOUTH EVERY DAY     Endocrinology:  Hypothyroid Agents Passed - 09/05/2023  1:09 PM      Passed - TSH in normal range and within 360 days    TSH  Date Value Ref Range Status  04/26/2023 0.506 0.450 - 4.500 uIU/mL Final         Passed - Valid encounter within last 12 months    Recent Outpatient Visits           1 month ago Drug-induced constipation   [place] [facility] [place], Sawmill T, NP   3 months ago Lateral epicondylitis of left elbow   [place] [facility] [place], Mendon T, NP   6 months ago Type 2 diabetes mellitus with morbid obesity (HCC)   [place] [facility] [place], McDade T, NP   9 months ago Type 2 diabetes mellitus with proteinuria (HCC)   [place] [facility] [place], Corrie Dandy T, NP   1 year ago Type 2 diabetes mellitus with morbid obesity (HCC)   [place] [facility] [place], Dorie Rank, NP       Future Appointments             In 1 month McGowan, Elana Alm [facility] [place]   In 2 months [place], Dorie Rank, NP [place] [facility]   In 2 months Deirdre Evener, MD [facility]

## 2023-09-07 ENCOUNTER — Ambulatory Visit: Payer: 59

## 2023-09-07 ENCOUNTER — Ambulatory Visit (INDEPENDENT_AMBULATORY_CARE_PROVIDER_SITE_OTHER): Payer: 59 | Admitting: Psychology

## 2023-09-07 DIAGNOSIS — F319 Bipolar disorder, unspecified: Secondary | ICD-10-CM | POA: Diagnosis not present

## 2023-09-07 DIAGNOSIS — F309 Manic episode, unspecified: Secondary | ICD-10-CM

## 2023-09-07 NOTE — Progress Notes (Signed)
 Lori Knight Counselor/Therapist Progress Note  Patient ID: Lori Knight, MRN: [id number]   Date: 09/07/23  Time Spent: 1:05 pm - 1:48 pm : 43 Minutes  Treatment Type: Individual Therapy.  Reported Symptoms: Bipolar, depression, and anxiety.   Mental Status Exam: Appearance:  Casual     Behavior: Appropriate  Motor: Normal  Speech/Language:  Clear and Coherent  Affect: Congruent  Mood: anxious  Thought process: normal  Thought content:   WNL  Sensory/Perceptual disturbances:   WNL  Orientation: oriented to person, place, time/date, and situation  Attention: Good  Concentration: Good  Memory: WNL  Fund of knowledge:  Good  Insight:   Good  Judgment:  Good  Impulse Control: Good   Risk Assessment: Danger to Self:  No Self-injurious Behavior: No Danger to Others: No Duty to Warn:no Physical Aggression / Violence:No  Access to Firearms a concern: No  Gang Involvement:No   Subjective:   Lori Knight participated from home, via video and consented to treatment. Therapist participated from home office. Lori Knight noted the events of the past week. She endorsed recent verbal impulsivity at work and noted ruminating after the fact. She provided two examples from work and how this affected her. We worked on identifying contributing factors to this. She noted concern that her medication could play a part in this. She noted her comments being true to how she felt but noted saying it unfiltered and impulsively. She noted her worry that this might develop into a pattern. She noted this occurring in multiple settings. She noted feeling anxious with these two situations at work [employer] in paying) and her husband. We worked on processing these events and therapist highlighted previously unaddressed stressors and Lori Knight highlighted anxiety about these issues coming back up. We discussed the importance of being mindful of stressors and feelings, working on addressing  distress proactively, and communicating purposefully. Therapist modeled this during the session. Lori Knight was engaged and motivated during the session. She expressed commitment towards goals. Therapist praised Lori Knight during the session and provided supportive therapy. A Follow-up was scheduled for continued treatment.   Interventions: CBT and interpersonal  Diagnosis:  Bipolar I disorder, single manic episode Lori Knight)  Psychiatric Treatment: Yes , Lori Knight, M.D. ([facility])    Treatment Plan:  Client Abilities/Strengths [name] is intelligent, consistent, and motivated for change.   Support System: Family and friends.   Client Treatment Preferences Outpatient Therapy.   Client Statement of Needs [name] would like to process events, bolster coping skills, reduce negative self-talk,  improve frustration tolerance, verbalizing thoughts and feelings, being more mindful, self-care, working through [name], managing cognitive distortions (jumps to conclusions), Identify areas of control and lack of control, and navigate interpersonal stressors.   Treatment Level Weekly  Symptoms  Depression:  feeling down, hypersomia, lethargy, difficulty concentrating.  (Status: maintained) Anxiety: feeling anxious, difficulty managing worry, worrying about different things, irritability, and feeling afraid something awful might happen .   (Status: maintained)  Goals:   Lori Knight experiences symptoms of   Treatment plan signed and available on s-drive:  No, pending signature.    Target Date: 03/27/24 Frequency: Weekly  Progress: 0 Modality: individual    Therapist will provide referrals for additional resources as appropriate.  Therapist will provide psycho-education regarding [name]'s diagnosis and corresponding treatment approaches and interventions. Licensed Clinical Social Worker, [place], LCSW will support the patient's ability to achieve the goals identified. will employ CBT, BA,  Problem-solving, Solution Focused, Mindfulness,  coping skills, & other evidenced-based  practices will be used to promote progress towards healthy functioning to help manage decrease symptoms associated with her diagnosis.   Reduce overall level, frequency, and intensity of the feelings of depression, anxiety and panic evidenced by decreased overall symptoms from 6 to 7 days/week to 0 to 1 days/week per client report for at least 3 consecutive months. Verbally express understanding of the relationship between feelings of depression, anxiety and their impact on thinking patterns and behaviors. Verbalize an understanding of the role that distorted thinking plays in creating fears, excessive worry, and ruminations.  Lori Knight participated in the creation of the treatment plan)   Lori Ovens, LCSW

## 2023-09-08 DIAGNOSIS — D329 Benign neoplasm of meninges, unspecified: Secondary | ICD-10-CM | POA: Diagnosis not present

## 2023-09-08 DIAGNOSIS — G3184 Mild cognitive impairment, so stated: Secondary | ICD-10-CM | POA: Diagnosis not present

## 2023-09-15 ENCOUNTER — Ambulatory Visit
Admission: RE | Admit: 2023-09-15 | Discharge: 2023-09-15 | Disposition: A | Discharge status: Home / Self Care | Payer: 59 | Source: Ambulatory Visit | Attending: Nurse Practitioner | Admitting: Nurse Practitioner

## 2023-09-15 DIAGNOSIS — Z1231 Encounter for screening mammogram for malignant neoplasm of breast: Secondary | ICD-10-CM | POA: Diagnosis not present

## 2023-09-19 DIAGNOSIS — R41841 Cognitive communication deficit: Secondary | ICD-10-CM | POA: Diagnosis not present

## 2023-09-20 ENCOUNTER — Ambulatory Visit (INDEPENDENT_AMBULATORY_CARE_PROVIDER_SITE_OTHER): Payer: 59 | Admitting: Psychology

## 2023-09-20 DIAGNOSIS — F309 Manic episode, unspecified: Secondary | ICD-10-CM

## 2023-09-20 NOTE — Progress Notes (Signed)
  Behavioral Health Counselor/Therapist Progress Note  Patient ID: Deaisa Merida, MRN: [id number]   Date: 09/20/23  Time Spent: 4:14 pm - 5:02  pm : 48 Minutes  Treatment Type: Individual Therapy.  Reported Symptoms: Bipolar, depression, and anxiety.   Mental Status Exam: Appearance:  Casual     Behavior: Appropriate  Motor: Normal  Speech/Language:  Clear and Coherent  Affect: Congruent  Mood: anxious  Thought process: normal  Thought content:   WNL  Sensory/Perceptual disturbances:   WNL  Orientation: oriented to person, place, time/date, and situation  Attention: Good  Concentration: Good  Memory: WNL  Fund of knowledge:  Good  Insight:   Good  Judgment:  Good  Impulse Control: Good   Risk Assessment: Danger to Self:  No Self-injurious Behavior: No Danger to Others: No Duty to Warn:no Physical Aggression / Violence:No  Access to Firearms a concern: No  Gang Involvement:No   Subjective:   [name] participated from home, via video and consented to treatment. Therapist participated from home office. Zhuri noted the events of the past week. She noted reflecting on the past session and noted that despite her catastrophizing, she noted that her perspective was not reflexive of other people's opinion. She noted a recent visit with her PCP and noted being diagnosed with menopause. She noted her  doctor mentioning that she experience agitation 5 years ago, prior to the testing. She is slated to complete additional psychological testing, a follow-up to the previous testing. She noted being slated to receive two awards from work. She noted some work frustrations regarding a recent work related stressor. She noted jumping to conclusions and "feeling like I am going to have a stoke". She noted taking a person's lack of action "personally". "I always think that if I am standing up for what I believe in, I feeling like it's not received well"  Her self talk includes "I am  loud and rude"and noted that she does not take the opportunity to see how people are reacting. She noted that she does not identify any behavior, implicit or explicit that supports her perspective.  Therapist highlighted jumps to conclusions, emotional thinking, and filtering. Therapist provided psycho-education regarding cognitive distortions. Therapist challenged these distortions during the session and encouraged this between sessions. She noted that when she has unmet needs, and frustration builds, that it "comes out snappy". We discussed working on being mindful of needs, communicating them in a timely manner, and choosing how she provide feedback. She noted giving feedback, to her husband, and him not heading this. We explored this during the session. Therapist encouraged continued conversation regarding boundaries and to develop an understanding of each other's boundaries. Therapist praised Bryony for her effort during the session  including her vulnerability. Margarit was engaged during the session and expressed commitment towards our goals. Therapist praised [place] and provided supportive therapy. A Follow-up was scheduled for continued treatment.   Interventions: CBT and interpersonal  Diagnosis:  Bipolar I disorder, single manic episode [facility])  Psychiatric Treatment: Yes , Christell Faith, M.D. ([facility])    Treatment Plan:  Client Abilities/Strengths Trevor is intelligent, consistent, and motivated for change.   Support System: Family and friends.   Client Treatment Preferences Outpatient Therapy.   Client Statement of Needs Ajanae would like to process events, bolster coping skills, reduce negative self-talk,  improve frustration tolerance, verbalizing thoughts and feelings, being more mindful, self-care, working through [name], managing cognitive distortions (jumps to conclusions), Identify areas of control and lack of  control, and navigate interpersonal stressors.   Treatment  Level Weekly  Symptoms  Depression:  feeling down, hypersomia, lethargy, difficulty concentrating.  (Status: maintained) Anxiety: feeling anxious, difficulty managing worry, worrying about different things, irritability, and feeling afraid something awful might happen .   (Status: maintained)  Goals:   [name] experiences symptoms of   Treatment plan signed and available on s-drive:  No, pending signature.    Target Date: 03/27/24 Frequency: Weekly  Progress: 0 Modality: individual    Therapist will provide referrals for additional resources as appropriate.  Therapist will provide psycho-education regarding Jennafer's diagnosis and corresponding treatment approaches and interventions. Licensed Clinical Social Worker, [place], LCSW will support the patient's ability to achieve the goals identified. will employ CBT, BA, Problem-solving, Solution Focused, Mindfulness,  coping skills, & other evidenced-based practices will be used to promote progress towards healthy functioning to help manage decrease symptoms associated with her diagnosis.   Reduce overall level, frequency, and intensity of the feelings of depression, anxiety and panic evidenced by decreased overall symptoms from 6 to 7 days/week to 0 to 1 days/week per client report for at least 3 consecutive months. Verbally express understanding of the relationship between feelings of depression, anxiety and their impact on thinking patterns and behaviors. Verbalize an understanding of the role that distorted thinking plays in creating fears, excessive worry, and ruminations.  [name] participated in the creation of the treatment plan)   Delight Ovens, LCSW

## 2023-09-25 ENCOUNTER — Other Ambulatory Visit: Payer: Self-pay | Admitting: Nurse Practitioner

## 2023-09-26 ENCOUNTER — Other Ambulatory Visit: Payer: Self-pay | Admitting: Nurse Practitioner

## 2023-09-26 MED ORDER — TRULICITY 4.5 MG/0.5ML ~~LOC~~ SOAJ: 4.5000 mg | SUBCUTANEOUS | 4 refills | Status: DC

## 2023-09-26 NOTE — Telephone Encounter (Signed)
 Requested medication (s) are due for refill today - yes  Requested medication (s) are on the active medication list -yes  Future visit scheduled -yes  Last refill: 07/11/23 #40 1RF  Notes to clinic: non delegated Rx  Requested Prescriptions  Pending Prescriptions Disp Refills   ondansetron (ZOFRAN-ODT) 4 MG disintegrating tablet [Pharmacy Med Name: ONDANSETRON ODT 4 MG TABLET] 40 tablet 1    Sig: TAKE 1 TABLET BY MOUTH EVERY 8 HOURS AS NEEDED FOR NAUSEA AND VOMITING     Not Delegated - Gastroenterology: Antiemetics - ondansetron Failed - 09/26/2023 10:47 AM      Failed - This refill cannot be delegated      Passed - AST in normal range and within 360 days    AST  Date Value Ref Range Status  08/29/2023 23 0 - 40 IU/L Final         Passed - ALT in normal range and within 360 days    ALT  Date Value Ref Range Status  08/29/2023 32 0 - 32 IU/L Final         Passed - Valid encounter within last 6 months    Recent Outpatient Visits           2 months ago Drug-induced constipation   [place] [facility] [place], Corrie Dandy T, NP   4 months ago Lateral epicondylitis of left elbow   [place] [facility] [place], Anton [place] T, NP   7 months ago Type 2 diabetes mellitus with morbid obesity (HCC)   [place] [facility] [place], Perry T, NP   9 months ago Type 2 diabetes mellitus with proteinuria (HCC)   [place] [facility] [place], [place] T, NP   1 year ago Type 2 diabetes mellitus with morbid obesity (HCC)   [place] [facility] [place], Corrie Dandy T, NP       Future Appointments             In 1 month McGowan, Wellington [place], PA-C [facility] [place]   In 2 months [place], Milton T, NP [place] [facility], [facility]               Requested Prescriptions  Pending Prescriptions Disp Refills   ondansetron (ZOFRAN-ODT) 4 MG disintegrating tablet [Pharmacy Med Name: ONDANSETRON ODT  4 MG TABLET] 40 tablet 1    Sig: TAKE 1 TABLET BY MOUTH EVERY 8 HOURS AS NEEDED FOR NAUSEA AND VOMITING     Not Delegated - Gastroenterology: Antiemetics - ondansetron Failed - 09/26/2023 10:47 AM      Failed - This refill cannot be delegated      Passed - AST in normal range and within 360 days    AST  Date Value Ref Range Status  08/29/2023 23 0 - 40 IU/L Final         Passed - ALT in normal range and within 360 days    ALT  Date Value Ref Range Status  08/29/2023 32 0 - 32 IU/L Final         Passed - Valid encounter within last 6 months    Recent Outpatient Visits           2 months ago Drug-induced constipation   [place] [facility] [place], Corrie Dandy T, NP   4 months ago Lateral epicondylitis of left elbow    [place] [facility] [place], [place] T, NP   7 months ago Type 2 diabetes  mellitus with morbid obesity (HCC)   [place] [facility] [place], [place] T, NP   9 months ago Type 2 diabetes mellitus with proteinuria (HCC)   [place] [facility] [place], Corrie Dandy T, NP   1 year ago Type 2 diabetes mellitus with morbid obesity (HCC)   [place] [facility] [place], Dorie Rank, NP       Future Appointments             In 1 month McGowan, Elana Alm [facility] [place]   In 2 months [place], Dorie Rank, NP [place] [facility], [facility]

## 2023-09-29 ENCOUNTER — Ambulatory Visit

## 2023-10-06 ENCOUNTER — Ambulatory Visit: Admitting: Psychology

## 2023-10-06 DIAGNOSIS — F309 Manic episode, unspecified: Secondary | ICD-10-CM

## 2023-10-12 DIAGNOSIS — R41841 Cognitive communication deficit: Secondary | ICD-10-CM | POA: Diagnosis not present

## 2023-10-24 ENCOUNTER — Ambulatory Visit (INDEPENDENT_AMBULATORY_CARE_PROVIDER_SITE_OTHER)

## 2023-10-24 ENCOUNTER — Ambulatory Visit
Admission: RE | Admit: 2023-10-24 | Discharge: 2023-10-24 | Disposition: A | Discharge status: Home / Self Care | Source: Ambulatory Visit | Attending: Family Medicine | Admitting: Family Medicine

## 2023-10-24 VITALS — BP 150/100 | HR 81 | Temp 98.2°F | Resp 16

## 2023-10-24 DIAGNOSIS — R1030 Lower abdominal pain, unspecified: Secondary | ICD-10-CM

## 2023-10-24 DIAGNOSIS — W19XXXA Unspecified fall, initial encounter: Secondary | ICD-10-CM

## 2023-10-24 DIAGNOSIS — S99912A Unspecified injury of left ankle, initial encounter: Secondary | ICD-10-CM

## 2023-10-24 DIAGNOSIS — M25572 Pain in left ankle and joints of left foot: Secondary | ICD-10-CM | POA: Diagnosis not present

## 2023-10-24 DIAGNOSIS — M25522 Pain in left elbow: Secondary | ICD-10-CM | POA: Diagnosis not present

## 2023-10-24 DIAGNOSIS — M7732 Calcaneal spur, left foot: Secondary | ICD-10-CM | POA: Diagnosis not present

## 2023-10-24 MED ORDER — METHOCARBAMOL 500 MG PO TABS: 500.0000 mg | ORAL_TABLET | Freq: Two times a day (BID) | ORAL | 0 refills | Status: DC

## 2023-10-24 MED ORDER — IBUPROFEN 400 MG PO TABS: 400.0000 mg | ORAL_TABLET | Freq: Three times a day (TID) | ORAL | 0 refills | Status: AC | PRN

## 2023-10-24 NOTE — Discharge Instructions (Addendum)
 On my review of your xray images, you did not have any fractures or dislocated bones. You did have have some bone spurs on your ankle. You have some arthritis in your joints. The radiologist has not yet read your xray. If it is significantly abnormal or urgent, someone will contact you.  You should see your results in MyChart.   Take Tylenol with Motrin as needed for pain.  Take the muscle relaxer, at bedtime and throughout the day as needed.  Wear your shoulder sling for comfort.

## 2023-10-24 NOTE — ED Provider Notes (Signed)
 [facility]    CSN: [id number] Arrival date & time: 10/24/23  0810      History   Chief Complaint Chief Complaint  Patient presents with   Fall    Elbow, back, ankle, stomach - Entered by patient    HPI  HPI Lori Knight is a 53 y.o. female.   Lori Knight presents after a fall yesterday.  Lori Knight was walking on the sidewalk and tripped over her feet.  She landed on her left side.   Left ankle pain and swelling in left knee from the fall. She cleaned the scrapes on her knee and applied some Neosporin.   Has left elbow pain that is not improving. This bothers her the most.  She was able to get up by herself.  Has aching pain. She iced her elbow. While driving the day of the fall, her arms felt weak but this has improved today. Took ibuprofen for pain.  She is right handed.   Has some ongoing abdominal pain since the fall.  Felt like something ripped when she fell.  Denies nausea, vomiting. Takes Monjauro and abdominal constipation and diarrhea are not new for her.        Past Medical History:  Diagnosis Date   Adult hypothyroidism 06/21/2012   Anxiety    Back pain    Benign paroxysmal positional nystagmus    Bilateral polycystic ovarian syndrome 06/21/2012   Biliary calculus with cholecystitis 08/14/2012   Bipolar 1 disorder (HCC)    BP (high blood pressure) 02/14/2014   Bronchitis    Constipation    COVID-19 07/2020   Depression    Diabetes mellitus (HCC) 08/14/2012   Diabetes mellitus without complication (HCC)    pre-diabetic   Dysplastic nevus 2023   posterior pelvis sacral area-mild atypia   Dysplastic nevus 2019   right suprascapular back-mild atypia   Dysplastic nevus 2019   left posterior thigh - mild atypia   Dysrhythmia    wore heart monitor 2016. Corrected by changing Levothyroxine dose.   Edema of both lower extremities    Gallbladder problem    GERD (gastroesophageal reflux disease)    Heart murmur    followed by PCP-AS A  CHILD-ASYMPTOMATIC   Hyperlipidemia    Hyperprolactinemia (HCC) 06/21/2012   Hypertension    Hypothyroid    Hypoxia 04/17/2018   Joint pain    Kidney problem    Meningioma (HCC)    Motion sickness    carnival rides   Neuropathy    PONV (postoperative nausea and vomiting)    Low BP after sinus surgery. WAKES UP CRYING   Shortness of breath dyspnea    stairs. related to wt.   Sleep apnea    has CPAP. has not used since 2011   Swallowing difficulty    Vertigo 2016   none recently   Vitamin D deficiency     Patient Active Problem List   Diagnosis Date Noted   Diabetes mellitus treated with injections of non-insulin medication (HCC) 08/27/2023   Constipation 07/11/2023   Right shoulder pain 12/02/2022   Macromastia 03/04/2022   Chronic midline low back pain without sciatica 01/15/2022   Hemorrhoids, external 12/02/2021   Aortic atherosclerosis (HCC) 09/30/2020   Type 2 diabetes mellitus with proteinuria (HCC) 02/20/2020   Meningioma (HCC) 12/24/2019   Hyperlipidemia associated with type 2 diabetes mellitus (HCC) 08/13/2019   Gastroesophageal reflux disease without esophagitis 06/20/2019   Type 2 diabetes mellitus with morbid obesity (HCC) 12/01/2015   OSA on  CPAP 09/22/2015   Internal hemorrhoids 08/28/2015   Vitamin D deficiency 08/28/2015   Obesity 08/27/2015   Hypertension associated with diabetes (HCC) 02/14/2014   Benign paroxysmal positional nystagmus 02/14/2014   Benign neoplasm of kidney 08/14/2012   Bipolar I disorder, single manic episode (HCC) 08/14/2012   Hashimoto's thyroiditis 06/21/2012   Bilateral polycystic ovarian syndrome 06/21/2012    Past Surgical History:  Procedure Laterality Date   CHOLECYSTECTOMY     COLONOSCOPY WITH PROPOFOL N/A 06/19/2015   Procedure: COLONOSCOPY WITH PROPOFOL;  Surgeon: Marnee Sink, MD;  Location: [facility];  Service: Endoscopy;  Laterality: N/A;  Diabetic - oral meds    DENTAL SURGERY      ESOPHAGOGASTRODUODENOSCOPY (EGD) WITH PROPOFOL N/A 01/16/2021   Procedure: ESOPHAGOGASTRODUODENOSCOPY (EGD) WITH PROPOFOL;  Surgeon: Marnee Sink, MD;  Location: [facility];  Service: Endoscopy;  Laterality: N/A;  Diabetic - oral meds   ETHMOIDECTOMY Bilateral 04/17/2018   Procedure: ETHMOIDECTOMY;  Surgeon: Mellody Sprout, MD;  Location: [facility];  Service: ENT;  Laterality: Bilateral;   GALLBLADDER SURGERY     IMAGE GUIDED SINUS SURGERY N/A 04/17/2018   Procedure: IMAGE GUIDED SINUS SURGERY;  Surgeon: Mellody Sprout, MD;  Location: [facility];  Service: ENT;  Laterality: N/A;   MAXILLARY ANTROSTOMY Bilateral 04/17/2018   Procedure: MAXILLARY ANTROSTOMY;  Surgeon: Mellody Sprout, MD;  Location: [facility];  Service: ENT;  Laterality: Bilateral;   NASAL SEPTOPLASTY W/ TURBINOPLASTY Bilateral 04/17/2018   Procedure: NASAL SEPTOPLASTY WITH TURBINATE REDUCTION;  Surgeon: Mellody Sprout, MD;  Location: [facility];  Service: ENT;  Laterality: Bilateral;   TOTAL ABDOMINAL HYSTERECTOMY  2012   cervical dysplasia/ovaries remian   TRANSURETHRAL RESECTION OF BLADDER TUMOR N/A 04/17/2018   Procedure: NTRANSURETHRAL RESECTION OF BLADDER TUMOR (TURBT) WITH GEMCITABINE;  Surgeon: Dustin Gimenez, MD;  Location: [facility];  Service: Urology;  Laterality: N/A;    OB History     Gravida  1   Para  1   Term      Preterm      AB      Living         SAB      IAB      Ectopic      Multiple      Live Births               Home Medications    Prior to Admission medications   Medication Sig Start Date End Date Taking? Authorizing Provider  amLODipine (NORVASC) 5 MG tablet Take 1 tablet (5 mg total) by mouth daily. 08/02/23  Yes Cannady, Jolene T, NP  Ascorbic Acid (VITAMIN C) 1000 MG tablet Take 1,000 mg by mouth daily.   Yes [provider]  Dulaglutide (TRULICITY) 4.5 MG/0.5ML SOAJ Inject 4.5 mg as directed once a week. 09/26/23  Yes Cannady, Jolene T, NP  ibuprofen (ADVIL) 400  MG tablet Take 1-2 tablets (400-800 mg total) by mouth every 8 (eight) hours as needed. 10/24/23  Yes Jeslie Lowe, DO  lamoTRIgine (LAMICTAL) 150 MG tablet Take 1 tablet (150 mg total) by mouth 2 (two) times daily. 08/02/23  Yes Cannady, Jolene T, NP  levothyroxine (SYNTHROID) 150 MCG tablet TAKE 1 TABLET BY MOUTH EVERY DAY 09/05/23  Yes Cannady, Jolene T, NP  losartan (COZAAR) 50 MG tablet Take 2 tablets (100 mg total) by mouth daily. 08/02/23  Yes Cannady, Jolene T, NP  metFORMIN (GLUCOPHAGE) 500 MG tablet Take 1 tablet (500 mg total) by mouth 2 (two) times daily. 08/02/23  Yes Cannady, Jolene T, NP  methocarbamol (ROBAXIN) 500 MG tablet Take 1 tablet (500 mg total) by mouth 2 (two) times daily. 10/24/23  Yes Jeffey Janssen, DO  Omega-3 Fatty Acids (FISH OIL) 1200 MG CAPS Take 1,200 mg by mouth daily.    Yes [provider]  omeprazole (PRILOSEC) 40 MG capsule TAKE 1 CAPSULE BY MOUTH EVERY DAY 08/02/23  Yes Cannady, Jolene T, NP  rosuvastatin (CRESTOR) 40 MG tablet Take 1 tablet (40 mg total) by mouth daily. 08/02/23  Yes Cannady, Jolene T, NP  tirzepatide (MOUNJARO) 10 MG/0.5ML Pen Inject 10 mg into the skin once a week. 08/22/23  Yes Cannady, Jolene T, NP  acetaminophen (TYLENOL) 500 MG tablet Take 500 mg by mouth every 6 (six) hours as needed.    [provider]  Cholecalciferol 5000 units capsule Take 1 capsule (5,000 Units total) by mouth daily. 11/28/15   Plonk, Chrissie Noa, MD  EPINEPHrine 0.3 mg/0.3 mL IJ SOAJ injection Inject 0.3 mg into the muscle as needed for anaphylaxis. 11/17/21   Cannady, Jolene T, NP  glucose blood (CONTOUR NEXT TEST) test strip USE TO CHECK BS UP TO 3 TIMES DAILY FOR DIABETES DX: E11.9 12/03/22   Cannady, Jolene T, NP  ipratropium (ATROVENT) 0.06 % nasal spray Place 2 sprays into both nostrils 4 (four) times daily. 10/12/21   Becky Augusta, NP  mometasone (ELOCON) 0.1 % cream SMARTSIG:1 Application Topical 01/28/21   [provider]  nystatin  (MYCOSTATIN/NYSTOP) powder Apply 1 application. topically 3 (three) times daily. 12/02/21   Cannady, Jolene T, NP  ondansetron (ZOFRAN-ODT) 4 MG disintegrating tablet TAKE 1 TABLET BY MOUTH EVERY 8 HOURS AS NEEDED FOR NAUSEA AND VOMITING 09/26/23   Aura Dials T, NP    Family History Family History  Problem Relation Age of Onset   Diabetes Maternal Grandmother    Hypertension Maternal Grandmother    Hyperlipidemia Maternal Grandmother    Hypothyroidism Maternal Grandmother    Heart disease Maternal Grandmother    Diabetes Mother    Hypertension Mother    Hypothyroidism Mother    Bipolar disorder Mother    Heart disease Mother    Kidney disease Mother    Thyroid disease Mother    Depression Mother    Anxiety disorder Mother    Drug abuse Mother    Obesity Mother    Alcohol abuse Father    Depression Daughter    Breast cancer Neg Hx     Social History Social History   Tobacco Use   Smoking status: Former    Current packs/day: 0.00    Average packs/day: 1 pack/day for 10.0 years (10.0 ttl pk-yrs)    Types: Cigarettes    Start date: 07/12/1992    Quit date: 07/12/2002    Years since quitting: 21.2   Smokeless tobacco: Never   Tobacco comments:    quit 2004  Vaping Use   Vaping status: Never Used  Substance Use Topics   Alcohol use: Yes    Alcohol/week: 0.0 standard drinks of alcohol    Comment: rarely   Drug use: No     Allergies   Hctz [hydrochlorothiazide], Lisinopril, Latex, and Prednisone   Review of Systems Review of Systems: :negative unless otherwise stated in HPI.      Physical Exam Triage Vital Signs ED Triage Vitals [10/24/23 0824]  Encounter Vitals Group     BP      Systolic BP Percentile      Diastolic BP Percentile  Pulse      Resp      Temp      Temp src      SpO2      Weight      Height      Head Circumference      Peak Flow      Pain Score 4     Pain Loc      Pain Education      Exclude from Growth Chart    No data  found.  Updated Vital Signs BP (!) 150/100 (BP Location: Right Arm)   Pulse 81   Temp 98.2 F (36.8 C) (Oral)   Resp 16   SpO2 97%   Visual Acuity Right Eye Distance:   Left Eye Distance:   Bilateral Distance:    Right Eye Near:   Left Eye Near:    Bilateral Near:     Physical Exam GEN: well appearing female in no acute distress  CVS: well perfused  RESP: speaking in full sentences without pause, no respiratory distress  ABD:   Soft, lower abdominal tenderness, nondistended, no guarding, no rebound, active bowel sounds throughout,  MSK:  LUE:  No evidence of bony deformity, asymmetry, or muscle atrophy. No tenderness over long head of biceps (bicipital groove).  No TTP at [facility] joint.  Olecranon and epicondyle tenderness present  Full passive ROM of elbow and wrist.  Good passive ROM of shoulder Strength 5/5 grip, elbow and shoulder. No abnormal scapular function observed.  Sensation intact. Peripheral pulses intact.  Left ankle: Inspection: No erythema, edema, ecchymosis or bony deformity, no bone pes planus or cavus deformity Palpation: Tenderness of the lateral malleolus  ROM: Full active and passive range of motion Strength: 5/5 in all directions No ligamentous laxity No pain at the base of the fifth metatarsal Able to ambulate  with pain  Special Tests: Anterior and posterior drawer : negative -Neurovascularly intact, no instability noted  Left Knee Exam -Inspection: no deformity, multiple healing red abrasions  -Palpation: no medial and lateral joint line tenderness, no tibial tuberosity tenderness, no joint infusion  -ROM: Extension: 0 degrees; Flexion: 140 degrees.   Strength testing was limited due to pain -Limb neurovascularly intact, no instability noted  UC Treatments / Results  Labs (all labs ordered are listed, but only abnormal results are displayed) Labs Reviewed - No data to display  EKG   Radiology DG Abd 1 View Result Date:  10/24/2023 CLINICAL DATA:  lower abdominal pain EXAM: ABDOMEN - 1 VIEW COMPARISON:  12/15/2021 FINDINGS: Stomach and small bowel decompressed. Visualized lung bases clear. Moderate proximal colonic fecal material, relatively decompressed distally. No abnormal abdominal calcifications. Regional bones unremarkable. IMPRESSION: Nonobstructive bowel gas pattern. Electronically Signed   By: Nicoletta Barrier M.D.   On: 10/24/2023 11:14   DG Ankle Complete Left Result Date: 10/24/2023 CLINICAL DATA:  fall, pain EXAM: LEFT ANKLE COMPLETE - 3+ VIEW COMPARISON:  05/29/2023 FINDINGS: There is no evidence of acute fracture, dislocation, or joint effusion. Calcaneal spurs. There is no evidence of arthropathy or other focal bone abnormality. Stable mild spurring dorsally in the midfoot. Soft tissues are unremarkable. IMPRESSION: Negative. Electronically Signed   By: Nicoletta Barrier M.D.   On: 10/24/2023 11:12   DG Elbow Complete Left Result Date: 10/24/2023 CLINICAL DATA:  Status post fall with elbow pain EXAM: LEFT ELBOW - COMPLETE 4 VIEW COMPARISON:  None Available. FINDINGS: There is no evidence of fracture, dislocation, or joint effusion. There is no evidence  of arthropathy or other focal bone abnormality. Soft tissues are unremarkable. IMPRESSION: No acute fracture or dislocation. Electronically Signed   By: Limin  Xu M.D.   On: 10/24/2023 09:56     Procedures Procedures (including critical care time)  Medications Ordered in UC Medications - No data to display  Initial Impression / Assessment and Plan / UC Course  I have reviewed the triage vital signs and the nursing notes.  Pertinent labs & imaging results that were available during my care of the patient were reviewed by me and considered in my medical decision making (see chart for details).      Pt is a 53 y.o.  female presents after a fall yesterday evening with left ankle, left knee, left elbow and abdominal pain. Declined pain control here. On exam, pt  has tenderness at lateral malleolus, olecranon, epicondyles concerning for possible fracture.   Obtained left ankle and left elbow plain films. She does have some calcaneal bone spurs. Personally interpreted by me were unremarkable for fracture or dislocation. Radiologist report reviewed. Pt given a shoulder sling for comfort. She has an air cast and crutches at home from previous injury.   Patient to gradually return to normal activities, as tolerated and continue ordinary activities within the limits permitted by pain. Prescribed ibuprofen and muscle relaxer for pain relief.  Tylenol PRN. Advised patient to avoid OTC NSAIDs while taking prescription NSAID.   Patient to follow up with orthopedic provider, if symptoms do not improve with conservative treatment.  Return and ED precautions given. Understanding voiced. Discussed MDM, treatment plan and plan for follow-up with patient who agrees with plan.   Final Clinical Impressions(s) / UC Diagnoses   Final diagnoses:  Left elbow pain  Lower abdominal pain  Injury of left ankle, initial encounter  Fall, initial encounter     Discharge Instructions      On my review of your xray images, you did not have any fractures or dislocated bones. You did have have some bone spurs on your ankle. You have some arthritis in your joints. The radiologist has not yet read your xray. If it is significantly abnormal or urgent, someone will contact you.  You should see your results in MyChart.   Take Tylenol with Motrin as needed for pain.  Take the muscle relaxer, at bedtime and throughout the day as needed.  Wear your shoulder sling for comfort.      ED Prescriptions     Medication Sig Dispense Auth. Provider   methocarbamol (ROBAXIN) 500 MG tablet Take 1 tablet (500 mg total) by mouth 2 (two) times daily. 20 tablet Makayli Bracken, DO   ibuprofen (ADVIL) 400 MG tablet Take 1-2 tablets (400-800 mg total) by mouth every 8 (eight) hours as needed. 30  tablet Cathern Tahir, DO      PDMP not reviewed this encounter.   Leshonda Galambos, DO 10/24/23 1321

## 2023-10-24 NOTE — ED Triage Notes (Signed)
 Patient states that she fell yesterday around 6. Injured left knee ankle and shoulder. Lower back and abdominal pain.

## 2023-10-25 ENCOUNTER — Ambulatory Visit: Payer: Commercial Managed Care - HMO | Admitting: Urology

## 2023-10-25 DIAGNOSIS — R41841 Cognitive communication deficit: Secondary | ICD-10-CM | POA: Diagnosis not present

## 2023-10-29 NOTE — Patient Instructions (Signed)

## 2023-10-31 ENCOUNTER — Ambulatory Visit (INDEPENDENT_AMBULATORY_CARE_PROVIDER_SITE_OTHER): Admitting: Nurse Practitioner

## 2023-10-31 ENCOUNTER — Other Ambulatory Visit: Payer: Self-pay | Admitting: Urology

## 2023-10-31 VITALS — BP 124/80 | HR 69 | Temp 97.9°F | Ht 66.0 in | Wt 234.4 lb

## 2023-10-31 DIAGNOSIS — Z6837 Body mass index (BMI) 37.0-37.9, adult: Secondary | ICD-10-CM

## 2023-10-31 DIAGNOSIS — K5903 Drug induced constipation: Secondary | ICD-10-CM

## 2023-10-31 DIAGNOSIS — E66812 Obesity, class 2: Secondary | ICD-10-CM | POA: Diagnosis not present

## 2023-10-31 DIAGNOSIS — D1771 Benign lipomatous neoplasm of kidney: Secondary | ICD-10-CM

## 2023-10-31 DIAGNOSIS — M7989 Other specified soft tissue disorders: Secondary | ICD-10-CM | POA: Insufficient documentation

## 2023-10-31 DIAGNOSIS — E063 Autoimmune thyroiditis: Secondary | ICD-10-CM | POA: Diagnosis not present

## 2023-10-31 MED ORDER — LUBIPROSTONE 24 MCG PO CAPS: 24.0000 ug | ORAL_CAPSULE | Freq: Two times a day (BID) | ORAL | 4 refills | Status: DC

## 2023-10-31 NOTE — Assessment & Plan Note (Signed)
 Suspect related to recent fall as injury to left knee, healing.  Minimal edema noted compared to right. Negative Homan's bilaterally.  Will check D Dimer due to patient concerns and will obtain imaging if elevated.  Reviewed plan of care with patient.

## 2023-10-31 NOTE — Assessment & Plan Note (Signed)
 BMI BMI 37.83 with T2DM, HTN/HLD, did lose weight with Mounjaro .  Recommended eating smaller high protein, low fat meals more frequently and exercising 30 mins a day 5 times a week with a goal of 10-15lb weight loss in the next 3 months. Patient voiced their understanding and motivation to adhere to these recommendations.

## 2023-10-31 NOTE — Progress Notes (Signed)
 BP 124/80 (BP Location: Left Arm, Patient Position: Sitting)   Pulse 69   Temp 97.9 F (36.6 C) (Oral)   Ht 5\' 6"  (1.676 m)   Wt 234 lb 6.4 oz (106.3 kg)   SpO2 98%   BMI 37.83 kg/m    Subjective:    Patient ID: Lori Knight, female    DOB: 1971-03-16, 53 y.o.   MRN: [id number]  HPI: Lori Knight is a 53 y.o. female  Chief Complaint  Patient presents with   Constipation    Patient states she has still been having issues with constipation. States she is still having the sharp pains in her abdomen, have become more consistent. States she had an xray on 10/24/23 due to a fall and was told she had a lot of stool in her colon   Leg Swelling    Patient states she has been noticing some swelling in her L leg since her fall last week. States she wants to make sure she does not have a blood clot or anything.    ABDOMINAL PAIN WITH CONSTIPATION  Ongoing issue for a year. Takes Mounjaro  for diabetes, has been on since December 2023. Has 5 pens of Mounjaro  left, then will return to Trulicity . Prior to Mounjaro  took Trulicity  without issue. Currently had not had BM in 5 days, but did have 2 this morning. Recent abdominal imaging did note moderate proximal colonic fecal material. Strains with BM to get it to move down, not to get it out. Is firm like "hard ice cream" when comes out.  Used Colace, Senna, Fiber gummies.  Has not used any other medications. Last colonoscopy was 06/19/2015 overall reassuring, hemorrhoids noted Duration:months Onset: gradual Severity: 5/10 Quality: electric twinge like sensation - lasts seconds Location:  RLQ -- still has appendix Episode duration: months Radiation: no Frequency: intermittent Alleviating factors: nothing Aggravating factors: unknown Status: fluctuating Treatments attempted: as above Fever: no Nausea: yes -- takes nausea medication as needed Vomiting: no Weight loss: yes with Mounjaro  Decreased appetite: no Diarrhea: no Constipation:  yes Blood in stool: no Heartburn: no Jaundice: no Rash: no Dysuria/urinary frequency: no Hematuria: no  LEFT LEG SWELLING Had a recent fall on 10/23/22.  Landed on left side, including knee.  Left lower leg has been swollen since then.  Crusting to left knee present.   Recurrent headaches: no Visual changes: no Palpitations: no Dyspnea: no Chest pain: no Lower extremity edema: left lower leg since fall Dizzy/lightheaded: no   Relevant past medical, surgical, family and social history reviewed and updated as indicated. Interim medical history since our last visit reviewed. Allergies and medications reviewed and updated.  Review of Systems  Constitutional:  Negative for activity change, appetite change, diaphoresis, fatigue and fever.  Respiratory:  Negative for cough, chest tightness, shortness of breath and wheezing.   Cardiovascular:  Negative for chest pain, palpitations and leg swelling.  Gastrointestinal:  Positive for abdominal pain, constipation and nausea. Negative for abdominal distention, blood in stool, diarrhea and vomiting.  Endocrine: Negative for cold intolerance, heat intolerance, polydipsia, polyphagia and polyuria.  Neurological: Negative.   Psychiatric/Behavioral: Negative.      Per HPI unless specifically indicated above     Objective:    BP 124/80 (BP Location: Left Arm, Patient Position: Sitting)   Pulse 69   Temp 97.9 F (36.6 C) (Oral)   Ht 5\' 6"  (1.676 m)   Wt 234 lb 6.4 oz (106.3 kg)   SpO2 98%   BMI 37.83 kg/m  Wt Readings from Last 3 Encounters:  10/31/23 234 lb 6.4 oz (106.3 kg)  08/29/23 230 lb 6.4 oz (104.5 kg)  07/11/23 234 lb (106.1 kg)    Physical Exam Vitals and nursing note reviewed.  Constitutional:      General: She is awake. She is not in acute distress.    Appearance: She is well-developed and well-groomed. She is obese. She is not ill-appearing or toxic-appearing.  HENT:     Head: Normocephalic.     Right Ear: Hearing  and external ear normal.     Left Ear: Hearing and external ear normal.  Eyes:     General: Lids are normal.        Right eye: No discharge.        Left eye: No discharge.     Conjunctiva/sclera: Conjunctivae normal.     Pupils: Pupils are equal, round, and reactive to light.  Neck:     Thyroid : No thyromegaly.     Vascular: No carotid bruit.  Cardiovascular:     Rate and Rhythm: Normal rate and regular rhythm.     Heart sounds: Normal heart sounds. No murmur heard.    No gallop.     Comments: Negative Homan's bilaterally.  Large area of crusting to left knee present. No redness to lower left extremity. Pulmonary:     Effort: Pulmonary effort is normal. No accessory muscle usage or respiratory distress.     Breath sounds: Normal breath sounds.  Abdominal:     General: Bowel sounds are normal. There is no distension.     Palpations: Abdomen is soft. There is no mass.     Tenderness: There is no abdominal tenderness. There is no right CVA tenderness, left CVA tenderness or guarding.  Musculoskeletal:     Cervical back: Normal range of motion and neck supple.     Right lower leg: No edema.     Left lower leg: No edema.  Lymphadenopathy:     Cervical: No cervical adenopathy.  Skin:    General: Skin is warm and dry.  Neurological:     Mental Status: She is alert and oriented to person, place, and time.     Deep Tendon Reflexes: Reflexes are normal and symmetric.     Reflex Scores:      Brachioradialis reflexes are 2+ on the right side and 2+ on the left side.      Patellar reflexes are 2+ on the right side and 2+ on the left side. Psychiatric:        Attention and Perception: Attention normal.        Mood and Affect: Mood normal.        Speech: Speech normal.        Behavior: Behavior normal. Behavior is cooperative.        Thought Content: Thought content normal.     Results for orders placed or performed in visit on 08/29/23  Bayer DCA Hb A1c Waived   Collection Time:  08/29/23  9:22 AM  Result Value Ref Range   HB A1C (BAYER DCA - WAIVED) 6.0 (H) 4.8 - 5.6 %  Microalbumin, Urine Waived   Collection Time: 08/29/23  9:22 AM  Result Value Ref Range   Microalb, Ur Waived 80 (H) 0 - 19 mg/L   Creatinine, Urine Waived 200 10 - 300 mg/dL   Microalb/Creat Ratio 30-300 (H) <30 mg/g  Comprehensive metabolic panel   Collection Time: 08/29/23  9:22 AM  Result Value Ref Range  Glucose 128 (H) 70 - 99 mg/dL   BUN 12 6 - 24 mg/dL   Creatinine, Ser 9.52 0.57 - 1.00 mg/dL   eGFR 99 >84 XL/KGM/0.10   BUN/Creatinine Ratio 17 9 - 23   Sodium 144 134 - 144 mmol/L   Potassium 4.6 3.5 - 5.2 mmol/L   Chloride 106 96 - 106 mmol/L   CO2 24 20 - 29 mmol/L   Calcium  9.8 8.7 - 10.2 mg/dL   Total Protein 6.8 6.0 - 8.5 g/dL   Albumin 4.4 3.8 - 4.9 g/dL   Globulin, Total 2.4 1.5 - 4.5 g/dL   Bilirubin Total 0.2 0.0 - 1.2 mg/dL   Alkaline Phosphatase 111 44 - 121 IU/L   AST 23 0 - 40 IU/L   ALT 32 0 - 32 IU/L  Lipid Panel w/o Chol/HDL Ratio   Collection Time: 08/29/23  9:22 AM  Result Value Ref Range   Cholesterol, Total 134 100 - 199 mg/dL   Triglycerides 272 0 - 149 mg/dL   HDL 41 >53 mg/dL   VLDL Cholesterol Cal 24 5 - 40 mg/dL   LDL Chol Calc (NIH) 69 0 - 99 mg/dL      Assessment & Plan:   Problem List Items Addressed This Visit       Endocrine   Hashimoto's thyroiditis   Chronic, ongoing.  Continue Levothyroxine  150 MCG daily.  Educated her on this.  Thyroid  labs today due to constipation.      Relevant Orders   T4, free   TSH     Other   Obesity - Primary   BMI BMI 37.83 with T2DM, HTN/HLD, did lose weight with Mounjaro .  Recommended eating smaller high protein, low fat meals more frequently and exercising 30 mins a day 5 times a week with a goal of 10-15lb weight loss in the next 3 months. Patient voiced their understanding and motivation to adhere to these recommendations.        Left leg swelling   Suspect related to recent fall as injury to  left knee, healing.  Minimal edema noted compared to right. Negative Homan's bilaterally.  Will check D Dimer due to patient concerns and will obtain imaging if elevated.  Reviewed plan of care with patient.      Relevant Orders   D-Dimer, Quantitative   Constipation   Suspect related to Mounjaro , did not have these issues with Trulicity  and she is switching back to that soon due to insurance.  Has been present for months.  Recent imaging did note moderate stool. No benefit from OTC regimen.  Start Lubiprostone  if covered, 24 MCG BID. Ensure plenty of water  intake daily and increased fiber in diet.  Goal is daily BM without straining, but also no diarrhea.  Monitor closely and adjust regimen as needed.  Referral to GI placed.      Relevant Orders   Comprehensive metabolic panel with GFR   Ambulatory referral to Gastroenterology     Follow up plan: Return for as scheduled May 21st.

## 2023-10-31 NOTE — Assessment & Plan Note (Signed)
 Chronic, ongoing.  Continue Levothyroxine  150 MCG daily.  Educated her on this.  Thyroid  labs today due to constipation.

## 2023-10-31 NOTE — Assessment & Plan Note (Signed)
 Suspect related to Mounjaro , did not have these issues with Trulicity  and she is switching back to that soon due to insurance.  Has been present for months.  Recent imaging did note moderate stool. No benefit from OTC regimen.  Start Lubiprostone  if covered, 24 MCG BID. Ensure plenty of water  intake daily and increased fiber in diet.  Goal is daily BM without straining, but also no diarrhea.  Monitor closely and adjust regimen as needed.  Referral to GI placed.

## 2023-11-01 ENCOUNTER — Telehealth: Payer: Self-pay

## 2023-11-01 ENCOUNTER — Ambulatory Visit: Payer: Self-pay | Admitting: Urology

## 2023-11-01 ENCOUNTER — Ambulatory Visit (INDEPENDENT_AMBULATORY_CARE_PROVIDER_SITE_OTHER): Admitting: Psychology

## 2023-11-01 ENCOUNTER — Encounter: Payer: Self-pay | Admitting: Nurse Practitioner

## 2023-11-01 DIAGNOSIS — F309 Manic episode, unspecified: Secondary | ICD-10-CM

## 2023-11-01 DIAGNOSIS — F3110 Bipolar disorder, current episode manic without psychotic features, unspecified: Secondary | ICD-10-CM

## 2023-11-01 LAB — COMPREHENSIVE METABOLIC PANEL WITH GFR
ALT: 49 IU/L — ABNORMAL HIGH (ref 0–32)
AST: 27 IU/L (ref 0–40)
Albumin: 4.5 g/dL (ref 3.8–4.9)
Alkaline Phosphatase: 101 IU/L (ref 44–121)
BUN/Creatinine Ratio: 10 (ref 9–23)
BUN: 8 mg/dL (ref 6–24)
Bilirubin Total: 0.3 mg/dL (ref 0.0–1.2)
CO2: 26 mmol/L (ref 20–29)
Calcium: 9.8 mg/dL (ref 8.7–10.2)
Chloride: 103 mmol/L (ref 96–106)
Creatinine, Ser: 0.77 mg/dL (ref 0.57–1.00)
Globulin, Total: 2.5 g/dL (ref 1.5–4.5)
Glucose: 88 mg/dL (ref 70–99)
Potassium: 4.6 mmol/L (ref 3.5–5.2)
Sodium: 143 mmol/L (ref 134–144)
Total Protein: 7 g/dL (ref 6.0–8.5)
eGFR: 92 mL/min/{1.73_m2} (ref >59)

## 2023-11-01 LAB — D-DIMER, QUANTITATIVE: D-DIMER: 0.31 mg{FEU}/L (ref 0.00–0.49)

## 2023-11-01 LAB — TSH: TSH: 0.738 u[IU]/mL (ref 0.450–4.500)

## 2023-11-01 LAB — T4, FREE: Free T4: 1.16 ng/dL (ref 0.82–1.77)

## 2023-11-01 NOTE — Progress Notes (Signed)
 Contacted via MyChart   Good afternoon Lori Knight, I have good news.  Overall labs are stable.  D dimer if normal, so I do not think we need to pursue imaging.  I suspect what we are seeing is more related to your fall.  Liver function, ALT and AST, shows mild elevation in ALT but this is something we can continue to monitor.  Thyroid  levels stable.  I do suspect constipation is coming more from Mounjaro .  We will watch closely.  Were you able to get Lubiprostone ?

## 2023-11-01 NOTE — Progress Notes (Signed)
 [facility] Counselor/Therapist Progress Note  Patient ID: Lori Knight, MRN: [id number]   Date: 11/01/23  Time Spent: 3:00 pm - 3:44  pm : 44 Minutes  Treatment Type: Individual Therapy.  Reported Symptoms: Bipolar, depression, and anxiety.   Mental Status Exam: Appearance:  Casual     Behavior: Appropriate  Motor: Normal  Speech/Language:  Clear and Coherent  Affect: Congruent  Mood: anxious  Thought process: normal  Thought content:   WNL  Sensory/Perceptual disturbances:   WNL  Orientation: oriented to person, place, time/date, and situation  Attention: Good  Concentration: Good  Memory: WNL  Fund of knowledge:  Good  Insight:   Good  Judgment:  Good  Impulse Control: Good   Risk Assessment: Danger to Self:  No Self-injurious Behavior: No Danger to Others: No Duty to Warn:no Physical Aggression / Violence:No  Access to Firearms a concern: No  Gang Involvement:No   Subjective:   Lori Knight participated from home, via video and consented to treatment. Therapist participated from office. Lori Knight noted the events of the past week. Lori Knight noted missing her appointment due to a lack of privacy and noted addressing this issue going forward. She noted a recent conversation with her husband regarding financial limitations that affect their ability to travel. She noted this conversation going well, overall. She noted having to navigate various topics during this time. She noted a difference in opinion on how to approach planning. We worked on exploring this during the session. She noted her effort to work on building comfort in areas that have historically being uncomfortable. Therapist praised Lori Knight for her effort in this area. We worked on delineating their differences and how to manage this going forward. She noted the effect of the political uncertainty and noted this resulting in increased anxiety and some relationship strains. We highlighted personality  differences in their relationships that have contributed to misunderstanding and, at times, strain including his logical approach and her emotional approach.  Therapist reviewed wise-mind, use of empathy, and working on attempting to understand the other person's perspective to bridge any possible gaps. Lori Knight was engaged and motivated during the session. She expressed commitment towards goals. Therapist praised Lori Knight and provided supportive therapy. A follow-up was scheduled for continued treatment.   Interventions: CBT and interpersonal  Diagnosis:  Bipolar I disorder, single manic episode [facility])  Psychiatric Treatment: Yes , Lori Knight, M.D. ([facility])    Treatment Plan:  Client Abilities/Strengths [name] is intelligent, consistent, and motivated for change.   Support System: Family and friends.   Client Treatment Preferences Outpatient Therapy.   Client Statement of Needs [name] would like to process events, bolster coping skills, reduce negative self-talk,  improve frustration tolerance, verbalizing thoughts and feelings, being more mindful, self-care, working through [name], managing cognitive distortions (jumps to conclusions), Identify areas of control and lack of control, and navigate interpersonal stressors.   Treatment Level Weekly  Symptoms  Depression:  feeling down, hypersomia, lethargy, difficulty concentrating.  (Status: maintained) Anxiety: feeling anxious, difficulty managing worry, worrying about different things, irritability, and feeling afraid something awful might happen .   (Status: maintained)  Goals:   [name] experiences symptoms of   Treatment plan signed and available on s-drive:  No, pending signature.    Target Date: 03/27/24 Frequency: Weekly  Progress: 0 Modality: individual    Therapist will provide referrals for additional resources as appropriate.  Therapist will provide psycho-education regarding [name]'s diagnosis and  corresponding treatment approaches and interventions. Licensed Visual merchandiser,  Belva Boyden, LCSW will support the patient's ability to achieve the goals identified. will employ CBT, BA, Problem-solving, Solution Focused, Mindfulness,  coping skills, & other evidenced-based practices will be used to promote progress towards healthy functioning to help manage decrease symptoms associated with her diagnosis.   Reduce overall level, frequency, and intensity of the feelings of depression, anxiety and panic evidenced by decreased overall symptoms from 6 to 7 days/week to 0 to 1 days/week per client report for at least 3 consecutive months. Verbally express understanding of the relationship between feelings of depression, anxiety and their impact on thinking patterns and behaviors. Verbalize an understanding of the role that distorted thinking plays in creating fears, excessive worry, and ruminations.  [name] participated in the creation of the treatment plan)   Belva Boyden, LCSW

## 2023-11-02 NOTE — Telephone Encounter (Signed)
 PA has been approved. Patient notified of approval via Mychart message.

## 2023-11-02 NOTE — Telephone Encounter (Signed)
 PA for Lubiprostone  initiated and submitted via Cover My Meds. Key: [id number]

## 2023-11-21 ENCOUNTER — Ambulatory Visit (INDEPENDENT_AMBULATORY_CARE_PROVIDER_SITE_OTHER): Admitting: Psychology

## 2023-11-21 ENCOUNTER — Ambulatory Visit
Admission: RE | Admit: 2023-11-21 | Discharge: 2023-11-21 | Disposition: A | Discharge status: Home / Self Care | Source: Ambulatory Visit | Attending: Urology | Admitting: Urology

## 2023-11-21 DIAGNOSIS — F309 Manic episode, unspecified: Secondary | ICD-10-CM

## 2023-11-21 DIAGNOSIS — D1771 Benign lipomatous neoplasm of kidney: Secondary | ICD-10-CM | POA: Insufficient documentation

## 2023-11-21 DIAGNOSIS — N289 Disorder of kidney and ureter, unspecified: Secondary | ICD-10-CM | POA: Diagnosis not present

## 2023-11-21 NOTE — Progress Notes (Signed)
 [facility] Counselor/Therapist Progress Note  Patient ID: Lori Knight, MRN: [id number]   Date: 11/21/23  Time Spent: 10:01 am - 10:41 am : 40 Minutes  Treatment Type: Individual Therapy.  Reported Symptoms: Bipolar, depression, and anxiety.   Mental Status Exam: Appearance:  Casual     Behavior: Appropriate  Motor: Normal  Speech/Language:  Clear and Coherent  Affect: Congruent  Mood: anxious  Thought process: normal  Thought content:   WNL  Sensory/Perceptual disturbances:   WNL  Orientation: oriented to person, place, time/date, and situation  Attention: Good  Concentration: Good  Memory: WNL  Fund of knowledge:  Good  Insight:   Good  Judgment:  Good  Impulse Control: Good   Risk Assessment: Danger to Self:  No Self-injurious Behavior: No Danger to Others: No Duty to Warn:no Physical Aggression / Violence:No  Access to Firearms a concern: No  Gang Involvement:No   Subjective:   [name] participated from home, via video and consented to treatment. Therapist participated from office. Lyndell noted the events of the past week. 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 She noted that for the company, as a whole, there is "tons of drama". She noted the opportunity to leave this company for a different one but noted not liking change. We worked on processing this during the session and identifying the pros and cons. She noted that her "gut goes to fear". We worked on identifying possible contributing factors to this during the session. She noted ruminating about a scheduled meeting with someone from her office. She noted catastrophizing about this until she contacted this person and requested an earlier conversation. She noted highlighting the possibility of this being in regards to an issue with the company and not her. We worked on exploring this during the session. We worked on identifying ways to build and maintain a  balanced approach and challenging distortions. Therapist modeled this during the session. Laurina was engaged and motivated during the session. She expressed commitment towards goals. Therapist praised Monteria nd provided supportive therapy. A follow-up was scheduled for continued treatment which Afsheen benefits from.   Interventions: CBT and interpersonal  Diagnosis:  Bipolar I disorder, single manic episode [facility])  Psychiatric Treatment: Yes , [name], M.D. ([facility])    Treatment Plan:  Client Abilities/Strengths Miracle is intelligent, consistent, and motivated for change.   Support System: Family and friends.   Client Treatment Preferences Outpatient Therapy.   Client Statement of Needs Tanyetta would like to process events, bolster coping skills, reduce negative self-talk,  improve frustration tolerance, verbalizing thoughts and feelings, being more mindful, self-care, working through [name], managing cognitive distortions (jumps to conclusions), Identify areas of control and lack of control, and navigate interpersonal stressors.   Treatment Level Weekly  Symptoms  Depression:  feeling down, hypersomia, lethargy, difficulty concentrating.  (Status: maintained) Anxiety: feeling anxious, difficulty managing worry, worrying about different things, irritability, and feeling afraid something awful might happen .   (Status: maintained)  Goals:   [name] experiences symptoms of   Treatment plan signed and available on s-drive:  No, pending signature.    Target Date: 03/27/24 Frequency: Weekly  Progress: 10% Modality: individual    Therapist will provide referrals for additional resources as appropriate.  Therapist will provide psycho-education regarding Annikah's diagnosis and corresponding treatment approaches and interventions. Licensed Clinical Social Worker, [place], LCSW will support the patient's ability to achieve the goals identified. will employ CBT, BA,  Problem-solving,  Solution Focused, Mindfulness,  coping skills, & other evidenced-based practices will be used to promote progress towards healthy functioning to help manage decrease symptoms associated with her diagnosis.   Reduce overall level, frequency, and intensity of the feelings of depression, anxiety and panic evidenced by decreased overall symptoms from 6 to 7 days/week to 0 to 1 days/week per client report for at least 3 consecutive months. Verbally express understanding of the relationship between feelings of depression, anxiety and their impact on thinking patterns and behaviors. Verbalize an understanding of the role that distorted thinking plays in creating fears, excessive worry, and ruminations.  [name] participated in the creation of the treatment plan)   Belva Boyden, LCSW

## 2023-11-26 ENCOUNTER — Other Ambulatory Visit: Payer: Self-pay | Admitting: Nurse Practitioner

## 2023-11-27 NOTE — Patient Instructions (Incomplete)
Be Involved in Caring For Your Health:  Taking Medications When medications are taken as directed, they can greatly improve your health. But if they are not taken as prescribed, they may not work. In some cases, not taking them correctly can be harmful. To help ensure your treatment remains effective and safe, understand your medications and how to take them. Bring your medications to each visit for review by your provider.  Your lab results, notes, and after visit summary will be available on My Chart. We strongly encourage you to use this feature. If lab results are abnormal the clinic will contact you with the appropriate steps. If the clinic does not contact you assume the results are satisfactory. You can always view your results on My Chart. If you have questions regarding your health or results, please contact the clinic during office hours. You can also ask questions on My Chart.  We at [facility] are grateful that you chose [place] to provide your care. We strive to provide evidence-based and compassionate care and are always looking for feedback. If you get a survey from the clinic please complete this so we can hear your opinions.  Diabetes Mellitus and Exercise Regular exercise is important for your health, especially if you have diabetes mellitus. Exercise is not just about losing weight. It can also help you increase muscle strength and bone density and reduce body fat and stress. This can help your level of endurance and make you more fit and flexible. Why should I exercise if I have diabetes? Exercise has many benefits for people with diabetes. It can: Help lower and control your blood sugar (glucose). Help your body respond better and become more sensitive to the hormone insulin. Reduce how much insulin your body needs. Lower your risk for heart disease by: Lowering how much "bad" cholesterol and triglycerides you have in your body. Increasing how much "good" cholesterol  you have in your body. Lowering your blood pressure. Lowering your blood glucose levels. What is my activity plan? Your health care provider or an expert trained in diabetes care (certified diabetes educator) can help you make an activity plan. This plan can help you find the type of exercise that works for you. It may also tell you how often to exercise and for how long. Be sure to: Get at least 150 minutes of medium-intensity or high-intensity exercise each week. This may involve brisk walking, biking, or water aerobics. Do stretching and strengthening exercises at least 2 times a week. This may involve yoga or weight lifting. Spread out your activity over at least 3 days of the week. Get some form of physical activity each day. Do not go more than 2 days in a row without some kind of activity. Avoid being inactive for more than 30 minutes at a time. Take frequent breaks to walk or stretch. Choose activities that you enjoy. Set goals that you know you can accomplish. Start slowly and increase the intensity of your exercise over time. How do I manage my diabetes during exercise?  Monitor your blood glucose Check your blood glucose before and after you exercise. If your blood glucose is 240 mg/dL (40.9 mmol/L) or higher before you exercise, check your urine for ketones. These are chemicals created by the liver. If you have ketones in your urine, do not exercise until your blood glucose returns to normal. If your blood glucose is 100 mg/dL (5.6 mmol/L) or lower, eat a snack that has 15-20 grams of carbohydrate in  it. Check your blood glucose 15 minutes after the snack to make sure that your level is above 100 mg/dL (5.6 mmol/L) before you start to exercise. Your risk for low blood glucose (hypoglycemia) goes up during and after exercise. Know the symptoms of this condition and how to treat it. Follow these instructions at home: Keep a carbohydrate snack on hand for use before, during, and after  exercise. This can help prevent or treat hypoglycemia. Avoid injecting insulin into parts of your body that are going to be used during exercise. This may include: Your arms, when you are going to play tennis. Your legs, when you are about to go jogging. Keep track of your exercise habits. This can help you and your health care provider watch and adjust your activity plan. Write down: What you eat before and after you exercise. Blood glucose levels before and after you exercise. The type and amount of exercise you do. Talk to your health care provider before you start a new activity. They may need to: Make sure that the activity is safe for you. Adjust your insulin, other medicines, and food that you eat. Drink water while you exercise. This can stop you from losing too much water (dehydration). It can also prevent problems caused by having a lot of heat in your body (heat stroke). Where to find more information American Diabetes Association: diabetes.org Association of Diabetes Care & Education Specialists: diabeteseducator.org This information is not intended to replace advice given to you by your health care provider. Make sure you discuss any questions you have with your health care provider. Document Revised: 12/16/2021 Document Reviewed: 12/16/2021 Elsevier Patient Education  2024 [employer].

## 2023-11-29 NOTE — Telephone Encounter (Signed)
 Requested Prescriptions  Pending Prescriptions Disp Refills   levothyroxine  (SYNTHROID ) 150 MCG tablet [Pharmacy Med Name: LEVOTHYROXINE  150 MCG TABLET] 90 tablet 1    Sig: TAKE 1 TABLET BY MOUTH EVERY DAY     Endocrinology:  Hypothyroid Agents Passed - 11/29/2023 11:54 AM      Passed - TSH in normal range and within 360 days    TSH  Date Value Ref Range Status  10/31/2023 0.738 0.450 - 4.500 uIU/mL Final         Passed - Valid encounter within last 12 months    Recent Outpatient Visits           4 weeks ago Class 2 severe obesity due to excess calories with serious comorbidity and body mass index (BMI) of 37.0 to 37.9 in adult [facility])   [place] [facility] [place], Jolene T, NP   3 months ago Type 2 diabetes mellitus with proteinuria (HCC)   [place] [facility] [place], Lavelle Posey, NP       Future Appointments             Tomorrow Cannady, Jolene T, NP [facility]   In 1 week [place], Nyra [facility] [place]

## 2023-11-30 ENCOUNTER — Ambulatory Visit: Payer: 59 | Admitting: Nurse Practitioner

## 2023-11-30 ENCOUNTER — Ambulatory Visit: Payer: Commercial Managed Care - HMO | Admitting: Dermatology

## 2023-11-30 DIAGNOSIS — I152 Hypertension secondary to endocrine disorders: Secondary | ICD-10-CM

## 2023-11-30 DIAGNOSIS — E559 Vitamin D deficiency, unspecified: Secondary | ICD-10-CM

## 2023-11-30 DIAGNOSIS — D329 Benign neoplasm of meninges, unspecified: Secondary | ICD-10-CM

## 2023-11-30 DIAGNOSIS — E1129 Type 2 diabetes mellitus with other diabetic kidney complication: Secondary | ICD-10-CM

## 2023-11-30 DIAGNOSIS — K219 Gastro-esophageal reflux disease without esophagitis: Secondary | ICD-10-CM

## 2023-11-30 DIAGNOSIS — G4733 Obstructive sleep apnea (adult) (pediatric): Secondary | ICD-10-CM

## 2023-11-30 DIAGNOSIS — E119 Type 2 diabetes mellitus without complications: Secondary | ICD-10-CM

## 2023-11-30 DIAGNOSIS — E1169 Type 2 diabetes mellitus with other specified complication: Secondary | ICD-10-CM

## 2023-11-30 DIAGNOSIS — E66812 Obesity, class 2: Secondary | ICD-10-CM

## 2023-11-30 DIAGNOSIS — I700 Atherosclerosis of aorta: Secondary | ICD-10-CM

## 2023-11-30 DIAGNOSIS — F309 Manic episode, unspecified: Secondary | ICD-10-CM

## 2023-12-06 ENCOUNTER — Ambulatory Visit: Admitting: Urology

## 2023-12-07 ENCOUNTER — Ambulatory Visit: Admitting: Urology

## 2023-12-07 ENCOUNTER — Ambulatory Visit (INDEPENDENT_AMBULATORY_CARE_PROVIDER_SITE_OTHER): Admitting: Psychology

## 2023-12-07 DIAGNOSIS — F309 Manic episode, unspecified: Secondary | ICD-10-CM | POA: Diagnosis not present

## 2023-12-07 NOTE — Progress Notes (Signed)
 [facility] Counselor/Therapist Progress Note  Patient ID: Lori Knight, MRN: [id number]   Date: 12/07/23  Time Spent: 09:01 am - 9:43 am : 42 Minutes  Treatment Type: Individual Therapy.  Reported Symptoms: Bipolar, depression, and anxiety.   Mental Status Exam: Appearance:  Casual     Behavior: Appropriate  Motor: Normal  Speech/Language:  Clear and Coherent  Affect: Congruent  Mood: normal  Thought process: normal  Thought content:   WNL  Sensory/Perceptual disturbances:   WNL  Orientation: oriented to person, place, time/date, and situation  Attention: Good  Concentration: Good  Memory: WNL  Fund of knowledge:  Good  Insight:   Good  Judgment:  Good  Impulse Control: Good   Risk Assessment: Danger to Self:  No Self-injurious Behavior: No Danger to Others: No Duty to Warn:no Physical Aggression / Violence:No  Access to Firearms a concern: No  Gang Involvement:No   Subjective:   Lori Knight participated from home, via video and consented to treatment. Therapist participated from home office. Lori Knight noted the events of the past week. She noted some improvement in her work related stressors. She noted making a decision to stay at her job after doing additional research. She noted relief with this decision. She noted "on the positive, I didn't sabotage myself" during this time including gossiping and "didn't create drama". She noted this being generally impulsive. She noted that because she was talking about it during the previous session she was able to be more purposeful and "intent" and not reflexive. She noted her effort to be more focused on addressing her own stressors and needs and not taking on other people's needs or stressors. She noted her efforts to set and maintain boundaries for self. Therapist praised Lori Knight for her effort to approach her stressors in a more adaptive and direct manner, to manage her impulse to respond, and to work on  collecting data prior to engaging. Therapist encouraged continued effort in this area. Lori Knight was engaged and motivated during the session and expressed commitment towards goals. A follow-up was scheduled for continued treatment which she benefits from.   Interventions: CBT and interpersonal  Diagnosis:  Bipolar I disorder, single manic episode [facility])  Psychiatric Treatment: Yes , Lori Knight, M.D. ([facility])    Treatment Plan:  Client Abilities/Strengths [name] is intelligent, consistent, and motivated for change.   Support System: Family and friends.   Client Treatment Preferences Outpatient Therapy.   Client Statement of Needs Lori Knight would like to process events, bolster coping skills, reduce negative self-talk,  improve frustration tolerance, verbalizing thoughts and feelings, being more mindful, self-care, working through [name], managing cognitive distortions (jumps to conclusions), Identify areas of control and lack of control, and navigate interpersonal stressors.   Treatment Level Weekly  Symptoms  Depression:  feeling down, hypersomia, lethargy, difficulty concentrating.  (Status: maintained) Anxiety: feeling anxious, difficulty managing worry, worrying about different things, irritability, and feeling afraid something awful might happen .   (Status: maintained)  Goals:   Lori Knight experiences symptoms of   Treatment plan signed and available on s-drive:  No, pending signature.    Target Date: 03/27/24 Frequency: Weekly  Progress: 10% Modality: individual    Therapist will provide referrals for additional resources as appropriate.  Therapist will provide psycho-education regarding Lori Knight's diagnosis and corresponding treatment approaches and interventions. Licensed Clinical Social Worker, [place], LCSW will support the patient's ability to achieve the goals identified. will employ CBT, BA, Problem-solving, Solution Focused, Mindfulness,  coping skills, &  other evidenced-based practices will be used to promote progress towards healthy functioning to help manage decrease symptoms associated with her diagnosis.   Reduce overall level, frequency, and intensity of the feelings of depression, anxiety and panic evidenced by decreased overall symptoms from 6 to 7 days/week to 0 to 1 days/week per client report for at least 3 consecutive months. Verbally express understanding of the relationship between feelings of depression, anxiety and their impact on thinking patterns and behaviors. Verbalize an understanding of the role that distorted thinking plays in creating fears, excessive worry, and ruminations.  Lori Knight participated in the creation of the treatment plan)   Lori Boyden, LCSW

## 2023-12-09 ENCOUNTER — Ambulatory Visit: Admitting: Urology

## 2023-12-20 ENCOUNTER — Other Ambulatory Visit (HOSPITAL_COMMUNITY): Payer: Self-pay

## 2023-12-20 ENCOUNTER — Telehealth: Payer: Self-pay

## 2023-12-20 ENCOUNTER — Encounter: Admitting: Psychology

## 2023-12-20 NOTE — Telephone Encounter (Signed)
 Pharmacy Patient Advocate Encounter   Received notification from Onbase that prior authorization for Omeprazole  40MG  dr capsules is required/requested.   Insurance verification completed.   The patient is insured through [facility] .   Per test claim: PA required and submitted KEY/EOC/Request #: [id number] from 12/20/2023 to 12/19/2024

## 2023-12-20 NOTE — Progress Notes (Signed)
 This encounter was created in error - please disregard.

## 2023-12-21 ENCOUNTER — Other Ambulatory Visit (HOSPITAL_COMMUNITY): Payer: Self-pay

## 2023-12-22 ENCOUNTER — Encounter: Payer: Self-pay | Admitting: Nurse Practitioner

## 2023-12-23 ENCOUNTER — Other Ambulatory Visit (HOSPITAL_COMMUNITY): Payer: Self-pay

## 2023-12-23 NOTE — Telephone Encounter (Signed)
 PA has been approved since 12/23/2023, called pharmacy and asked them to rerun medication for a 30 day supply. They are getting medication ready, per [name], med will cost pt $4.00, thank you

## 2024-01-01 NOTE — Progress Notes (Unsigned)
 01/02/2024 5:00 PM   Lori Knight 1970/07/23 [id number]  Referring provider: Valerio Melanie DASEN, NP [address] [place],  [place] [address]  Urological history: 1.  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 Mixed incontinence - completed 12 weekly sessions of PTNS  4. Nocturia - on CPAP   Chief Complaint  Patient presents with   Over Active Bladder   HPI: Lori Knight is a 53 y.o. woman who presents today for follow up.    Previous records reviewed.   She has bilateral angiomyolipomas which we have been following.  The right angiomyolipoma appears to be enlarging.  She states that she has been having bilateral flank pain.  She denies any gross hematuria.     She has been experiencing 1-7 daytime voids, nocturia x 3 or more with a strong urge to urinate.  She has noticed a significant decrease in her urinary leakage since she was lost weight on Mounjaro .  She estimating 1-2 times a week.  She does not have to wear pads daily.  She does not limit her fluid intake.  She does not engage in toilet mapping.  She does contribute the nocturia to having water  next to her bed.  Patient denies any modifying or aggravating factors.  Patient denies any recent UTI's, gross hematuria, dysuria or suprapubic/flank pain.  Patient denies any fevers,  chills, nausea or vomiting.    PVR 2 mL   UA a few bacteria and yeast.  She denies any current symptoms of a yeast infection. PMH: Past Medical History:  Diagnosis Date   Adult hypothyroidism 06/21/2012   Anxiety    Back pain    Benign paroxysmal positional nystagmus    Bilateral polycystic ovarian syndrome 06/21/2012   Biliary calculus with cholecystitis 08/14/2012   Bipolar 1 disorder (HCC)    BP (high blood pressure) 02/14/2014   Bronchitis    Constipation    COVID-19 07/2020   Depression    Diabetes mellitus (HCC) 08/14/2012   Diabetes mellitus without complication (HCC)    pre-diabetic   Dysplastic nevus 2023   posterior pelvis sacral area-mild atypia   Dysplastic nevus 2019   right suprascapular back-mild atypia   Dysplastic nevus 2019   left posterior thigh - mild atypia   Dysrhythmia    wore heart monitor 2016. Corrected by changing Levothyroxine  dose.   Edema of both lower extremities    Gallbladder problem    GERD (gastroesophageal reflux disease)    Heart murmur    followed by PCP-AS A CHILD-ASYMPTOMATIC   Hyperlipidemia    Hyperprolactinemia (HCC) 06/21/2012   Hypertension    Hypothyroid    Hypoxia 04/17/2018   Joint pain    Kidney problem    Meningioma (HCC)    Motion sickness    carnival rides   Neuropathy    PONV (postoperative nausea and vomiting)  Low BP after sinus surgery. WAKES UP CRYING   Shortness of breath dyspnea    stairs. related to wt.   Sleep apnea    has CPAP. has not used since 2011   Swallowing difficulty    Vertigo 2016   none recently   Vitamin D  deficiency     Surgical History: Past Surgical History:  Procedure Laterality Date   CHOLECYSTECTOMY     COLONOSCOPY WITH PROPOFOL  N/A 06/19/2015   Procedure: COLONOSCOPY WITH PROPOFOL ;  Surgeon: Rogelia Copping, MD;  Location: [facility];  Service: Endoscopy;  Laterality: N/A;  Diabetic - oral meds    DENTAL SURGERY     ESOPHAGOGASTRODUODENOSCOPY (EGD) WITH PROPOFOL   N/A 01/16/2021   Procedure: ESOPHAGOGASTRODUODENOSCOPY (EGD) WITH PROPOFOL ;  Surgeon: Copping Rogelia, MD;  Location: [facility];  Service: Endoscopy;  Laterality: N/A;  Diabetic - oral meds   ETHMOIDECTOMY Bilateral 04/17/2018   Procedure: ETHMOIDECTOMY;  Surgeon: Edda Mt, MD;  Location: [facility];  Service: ENT;  Laterality: Bilateral;   GALLBLADDER SURGERY     IMAGE GUIDED SINUS SURGERY N/A 04/17/2018   Procedure: IMAGE GUIDED SINUS SURGERY;  Surgeon: Edda Mt, MD;  Location: [facility];  Service: ENT;  Laterality: N/A;   MAXILLARY ANTROSTOMY Bilateral 04/17/2018   Procedure: MAXILLARY ANTROSTOMY;  Surgeon: Edda Mt, MD;  Location: [facility];  Service: ENT;  Laterality: Bilateral;   NASAL SEPTOPLASTY W/ TURBINOPLASTY Bilateral 04/17/2018   Procedure: NASAL SEPTOPLASTY WITH TURBINATE REDUCTION;  Surgeon: Edda Mt, MD;  Location: [facility];  Service: ENT;  Laterality: Bilateral;   TOTAL ABDOMINAL HYSTERECTOMY  2012   cervical dysplasia/ovaries remian   TRANSURETHRAL RESECTION OF BLADDER TUMOR N/A 04/17/2018   Procedure: NTRANSURETHRAL RESECTION OF BLADDER TUMOR (TURBT) WITH GEMCITABINE ;  Surgeon: Penne Knee, MD;  Location: [facility];  Service: Urology;  Laterality: N/A;    Home Medications:  Allergies as of 01/02/2024       Reactions   Acyclovir And Related    Hctz [hydrochlorothiazide ] Cough   Lisinopril Cough   Latex Rash   Condoms only   Prednisone Anxiety   Paranoia        Medication List        Accurate as of January 02, 2024  5:00 PM. If you have any questions, ask your nurse or doctor.          acetaminophen  500 MG tablet Commonly known as: TYLENOL  Take 500 mg by mouth every 6 (six) hours as needed.   amLODipine  5 MG tablet Commonly known as: NORVASC  Take 1 tablet (5 mg total) by mouth daily.   Cholecalciferol  125 MCG (5000 UT) capsule Take 1 capsule (5,000 Units total) by mouth daily.   Contour Next Test test strip Generic drug:  glucose blood USE TO CHECK BS UP TO 3 TIMES DAILY FOR DIABETES DX: E11.9   EPINEPHrine  0.3 mg/0.3 mL Soaj injection Commonly known as: EPI-PEN Inject 0.3 mg into the muscle as needed for anaphylaxis.   Fish Oil 1200 MG Caps Take 1,200 mg by mouth daily.   HYDROcodone -acetaminophen  5-325 MG tablet Commonly known as: NORCO/VICODIN Take 1-2 tablets by mouth every 4 (four) hours as needed.   ibuprofen  400 MG tablet Commonly known as: ADVIL  Take 1-2 tablets (400-800 mg total) by mouth every 8 (eight) hours as needed.   ipratropium 0.06 % nasal spray Commonly known as: ATROVENT  Place 2 sprays into both nostrils 4 (four) times daily.   lamoTRIgine  150 MG tablet Commonly known as: LAMICTAL  Take 1 tablet (150 mg  total) by mouth 2 (two) times daily.   levothyroxine  150 MCG tablet Commonly known as: SYNTHROID  TAKE 1 TABLET BY MOUTH EVERY DAY   losartan  50 MG tablet Commonly known as: Cozaar  Take 2 tablets (100 mg total) by mouth daily.   lubiprostone  24 MCG capsule Commonly known as: AMITIZA  Take 1 capsule (24 mcg total) by mouth 2 (two) times daily with a meal.   metFORMIN  500 MG tablet Commonly known as: GLUCOPHAGE  Take 1 tablet (500 mg total) by mouth 2 (two) times daily.   methocarbamol  500 MG tablet Commonly known as: ROBAXIN  Take 1 tablet (500 mg total) by mouth 2 (two) times daily.   mometasone 0.1 % cream Commonly known as: ELOCON SMARTSIG:1 Application Topical   nystatin  powder Commonly known as: MYCOSTATIN /NYSTOP  Apply 1 application. topically 3 (three) times daily.   omeprazole  40 MG capsule Commonly known as: PRILOSEC TAKE 1 CAPSULE BY MOUTH EVERY DAY   ondansetron  4 MG disintegrating tablet Commonly known as: ZOFRAN -ODT TAKE 1 TABLET BY MOUTH EVERY 8 HOURS AS NEEDED FOR NAUSEA AND VOMITING   rosuvastatin  40 MG tablet Commonly known as: CRESTOR  Take 1 tablet (40 mg total) by mouth daily.   tirzepatide  10 MG/0.5ML Pen Commonly known as:  MOUNJARO  Inject 10 mg into the skin once a week.   Trulicity  4.5 MG/0.5ML Soaj Generic drug: Dulaglutide  Inject 4.5 mg as directed once a week.   vitamin C 1000 MG tablet Take 1,000 mg by mouth daily.        Allergies:  Allergies  Allergen Reactions   Acyclovir And Related    Hctz [Hydrochlorothiazide ] Cough   Lisinopril Cough   Latex Rash    Condoms only   Prednisone Anxiety    Paranoia    Family History: Family History  Problem Relation Age of Onset   Diabetes Maternal Grandmother    Hypertension Maternal Grandmother    Hyperlipidemia Maternal Grandmother    Hypothyroidism Maternal Grandmother    Heart disease Maternal Grandmother    Diabetes Mother    Hypertension Mother    Hypothyroidism Mother    Bipolar disorder Mother    Heart disease Mother    Kidney disease Mother    Thyroid  disease Mother    Depression Mother    Anxiety disorder Mother    Drug abuse Mother    Obesity Mother    Alcohol abuse Father    Depression Daughter    Breast cancer Neg Hx     Social History:  reports that she quit smoking about 21 years ago. Her smoking use included cigarettes. She started smoking about 31 years ago. She has a 10 pack-year smoking history. She has never used smokeless tobacco. She reports current alcohol use. She reports that she does not use drugs.  ROS: Pertinent ROS in HPI  Physical Exam: BP 121/82 (BP Location: Left Arm, Patient Position: Sitting, Cuff Size: Large)   Pulse 85   Ht 5' 6 (1.676 m)   Wt 234 lb 6.4 oz (106.3 kg)   SpO2 98%   BMI 37.83 kg/m   Constitutional:  Well nourished. Alert and oriented, No acute distress. HEENT: [place] AT, moist mucus membranes.  Trachea midline Cardiovascular: No clubbing, cyanosis, or edema. Respiratory: Normal respiratory effort, no increased work of breathing. Neurologic: Grossly intact, no focal deficits, moving all 4 extremities. Psychiatric: Normal mood and affect.  Laboratory Data: Lab Results   Component Value Date   WBC 9.0 02/23/2023   HGB 14.0 02/23/2023   HCT 43.4 02/23/2023  MCV 85 02/23/2023   PLT 315 02/23/2023    Lab Results  Component Value Date   CREATININE 0.77 10/31/2023   Lab Results  Component Value Date   HGBA1C 6.0 (H) 08/29/2023    Lab Results  Component Value Date   TSH 0.738 10/31/2023       Component Value Date/Time   CHOL 134 08/29/2023 0922   HDL 41 08/29/2023 0922   LDLCALC 69 08/29/2023 0922    Lab Results  Component Value Date   AST 27 10/31/2023   Lab Results  Component Value Date   ALT 49 (H) 10/31/2023   Urinalysis See EPIC and HPI  I have reviewed the labs.   Pertinent Imaging:  01/02/24 15:07  Scan Result 2ml   Assessment & Plan:    1.  Bilateral angiomyolipomas - Right angiomyolipoma seems to be increasing in size, so we will order a contrast CT for confirmation - We discussed that if the angiomyolipoma has increased in size, we may need to consider IR intervention  2.  Intermediate risk hematuria - no reports of gross heme - UA w/o micro heme  3. Mixed incontinence - improved with weight loss  4. Nocturia - improved with weight loss   Return for I will call patient with results.  These notes generated with voice recognition software. I apologize for typographical errors.  CLOTILDA HELON RIGGERS  [facility] [address]  [address] [place], [place] [address] [phone]

## 2024-01-02 ENCOUNTER — Ambulatory Visit: Admitting: Urology

## 2024-01-02 ENCOUNTER — Other Ambulatory Visit
Admission: RE | Admit: 2024-01-02 | Discharge: 2024-01-02 | Disposition: A | Discharge status: Home / Self Care | Attending: Urology | Admitting: Urology

## 2024-01-02 ENCOUNTER — Encounter: Payer: Self-pay | Admitting: Urology

## 2024-01-02 ENCOUNTER — Other Ambulatory Visit: Payer: Self-pay

## 2024-01-02 VITALS — BP 121/82 | HR 85 | Ht 66.0 in | Wt 234.4 lb

## 2024-01-02 DIAGNOSIS — D1771 Benign lipomatous neoplasm of kidney: Secondary | ICD-10-CM | POA: Diagnosis not present

## 2024-01-02 DIAGNOSIS — N3281 Overactive bladder: Secondary | ICD-10-CM

## 2024-01-02 DIAGNOSIS — R351 Nocturia: Secondary | ICD-10-CM | POA: Diagnosis not present

## 2024-01-02 DIAGNOSIS — R319 Hematuria, unspecified: Secondary | ICD-10-CM | POA: Diagnosis not present

## 2024-01-02 DIAGNOSIS — Z87448 Personal history of other diseases of urinary system: Secondary | ICD-10-CM | POA: Diagnosis not present

## 2024-01-02 LAB — URINALYSIS, COMPLETE (UACMP) WITH MICROSCOPIC
Bilirubin Urine: NEGATIVE
Glucose, UA: NEGATIVE mg/dL
Hgb urine dipstick: NEGATIVE
Ketones, ur: NEGATIVE mg/dL
Nitrite: NEGATIVE
Protein, ur: NEGATIVE mg/dL
Specific Gravity, Urine: 1.015 (ref 1.005–1.030)
pH: 6.5 (ref 5.0–8.0)

## 2024-01-02 LAB — BLADDER SCAN AMB NON-IMAGING

## 2024-01-02 NOTE — Patient Instructions (Signed)
 Please call Scheduling at [phone] to set up appointment for CT Scan

## 2024-01-09 ENCOUNTER — Ambulatory Visit

## 2024-01-16 ENCOUNTER — Inpatient Hospital Stay: Admission: RE | Admit: 2024-01-16 | Source: Ambulatory Visit

## 2024-01-21 ENCOUNTER — Encounter: Payer: Self-pay | Admitting: Nurse Practitioner

## 2024-01-23 NOTE — Patient Instructions (Incomplete)
 Icy/Lidocaine  patches and Voltaren, Thermacare patches  Diabetes Mellitus and Exercise Regular exercise is important for your health, especially if you have diabetes mellitus. Exercise is not just about losing weight. It can also help you increase muscle strength and bone density and reduce body fat and stress. This can help your level of endurance and make you more fit and flexible. Why should I exercise if I have diabetes? Exercise has many benefits for people with diabetes. It can: Help lower and control your blood sugar (glucose). Help your body respond better and become more sensitive to the hormone insulin . Reduce how much insulin  your body needs. Lower your risk for heart disease by: Lowering how much bad cholesterol and triglycerides you have in your body. Increasing how much good cholesterol you have in your body. Lowering your blood pressure. Lowering your blood glucose levels. What is my activity plan? Your health care provider or an expert trained in diabetes care (certified diabetes educator) can help you make an activity plan. This plan can help you find the type of exercise that works for you. It may also tell you how often to exercise and for how long. Be sure to: Get at least 150 minutes of medium-intensity or high-intensity exercise each week. This may involve brisk walking, biking, or water  aerobics. Do stretching and strengthening exercises at least 2 times a week. This may involve yoga or weight lifting. Spread out your activity over at least 3 days of the week. Get some form of physical activity each day. Do not go more than 2 days in a row without some kind of activity. Avoid being inactive for more than 30 minutes at a time. Take frequent breaks to walk or stretch. Choose activities that you enjoy. Set goals that you know you can accomplish. Start slowly and increase the intensity of your exercise over time. How do I manage my diabetes during exercise?  Monitor  your blood glucose Check your blood glucose before and after you exercise. If your blood glucose is 240 mg/dL (86.6 mmol/L) or higher before you exercise, check your urine for ketones. These are chemicals created by the liver. If you have ketones in your urine, do not exercise until your blood glucose returns to normal. If your blood glucose is 100 mg/dL (5.6 mmol/L) or lower, eat a snack that has 15-20 grams of carbohydrate in it. Check your blood glucose 15 minutes after the snack to make sure that your level is above 100 mg/dL (5.6 mmol/L) before you start to exercise. Your risk for low blood glucose (hypoglycemia) goes up during and after exercise. Know the symptoms of this condition and how to treat it. Follow these instructions at home: Keep a carbohydrate snack on hand for use before, during, and after exercise. This can help prevent or treat hypoglycemia. Avoid injecting insulin  into parts of your body that are going to be used during exercise. This may include: Your arms, when you are going to play tennis. Your legs, when you are about to go jogging. Keep track of your exercise habits. This can help you and your health care provider watch and adjust your activity plan. Write down: What you eat before and after you exercise. Blood glucose levels before and after you exercise. The type and amount of exercise you do. Talk to your health care provider before you start a new activity. They may need to: Make sure that the activity is safe for you. Adjust your insulin , other medicines, and food that you eat.  Drink water  while you exercise. This can stop you from losing too much water  (dehydration). It can also prevent problems caused by having a lot of heat in your body (heat stroke). Where to find more information American Diabetes Association: diabetes.org Association of Diabetes Care & Education Specialists: diabeteseducator.org This information is not intended to replace advice given to  you by your health care provider. Make sure you discuss any questions you have with your health care provider. Document Revised: 12/16/2021 Document Reviewed: 12/16/2021 Elsevier Patient Education  2024 [employer].

## 2024-01-24 ENCOUNTER — Ambulatory Visit (INDEPENDENT_AMBULATORY_CARE_PROVIDER_SITE_OTHER): Admitting: Nurse Practitioner

## 2024-01-24 ENCOUNTER — Encounter: Payer: Self-pay | Admitting: Nurse Practitioner

## 2024-01-24 DIAGNOSIS — E1159 Type 2 diabetes mellitus with other circulatory complications: Secondary | ICD-10-CM | POA: Diagnosis not present

## 2024-01-24 DIAGNOSIS — E66812 Obesity, class 2: Secondary | ICD-10-CM

## 2024-01-24 DIAGNOSIS — E559 Vitamin D deficiency, unspecified: Secondary | ICD-10-CM | POA: Diagnosis not present

## 2024-01-24 DIAGNOSIS — E1129 Type 2 diabetes mellitus with other diabetic kidney complication: Secondary | ICD-10-CM | POA: Diagnosis not present

## 2024-01-24 DIAGNOSIS — E063 Autoimmune thyroiditis: Secondary | ICD-10-CM | POA: Diagnosis not present

## 2024-01-24 DIAGNOSIS — M25512 Pain in left shoulder: Secondary | ICD-10-CM | POA: Insufficient documentation

## 2024-01-24 DIAGNOSIS — E119 Type 2 diabetes mellitus without complications: Secondary | ICD-10-CM

## 2024-01-24 DIAGNOSIS — I700 Atherosclerosis of aorta: Secondary | ICD-10-CM

## 2024-01-24 DIAGNOSIS — F309 Manic episode, unspecified: Secondary | ICD-10-CM

## 2024-01-24 DIAGNOSIS — E1169 Type 2 diabetes mellitus with other specified complication: Secondary | ICD-10-CM

## 2024-01-24 DIAGNOSIS — D329 Benign neoplasm of meninges, unspecified: Secondary | ICD-10-CM

## 2024-01-24 DIAGNOSIS — I152 Hypertension secondary to endocrine disorders: Secondary | ICD-10-CM | POA: Diagnosis not present

## 2024-01-24 DIAGNOSIS — Z7985 Long-term (current) use of injectable non-insulin antidiabetic drugs: Secondary | ICD-10-CM | POA: Diagnosis not present

## 2024-01-24 DIAGNOSIS — R809 Proteinuria, unspecified: Secondary | ICD-10-CM | POA: Diagnosis not present

## 2024-01-24 DIAGNOSIS — E785 Hyperlipidemia, unspecified: Secondary | ICD-10-CM | POA: Diagnosis not present

## 2024-01-24 LAB — BAYER DCA HB A1C WAIVED: HB A1C (BAYER DCA - WAIVED): 6.4 % — ABNORMAL HIGH (ref 4.8–5.6)

## 2024-01-24 MED ORDER — LAMOTRIGINE 150 MG PO TABS: 150.0000 mg | ORAL_TABLET | Freq: Two times a day (BID) | ORAL | 7 refills | Status: DC

## 2024-01-24 MED ORDER — AMLODIPINE BESYLATE 5 MG PO TABS: 5.0000 mg | ORAL_TABLET | Freq: Every day | ORAL | 12 refills | Status: DC

## 2024-01-24 MED ORDER — OMEPRAZOLE 40 MG PO CPDR: DELAYED_RELEASE_CAPSULE | ORAL | 5 refills | Status: DC

## 2024-01-24 MED ORDER — LEVOTHYROXINE SODIUM 150 MCG PO TABS: 150.0000 ug | ORAL_TABLET | Freq: Every day | ORAL | 2 refills | Status: DC

## 2024-01-24 MED ORDER — METFORMIN HCL 500 MG PO TABS: 500.0000 mg | ORAL_TABLET | Freq: Two times a day (BID) | ORAL | 5 refills | Status: DC

## 2024-01-24 MED ORDER — ROSUVASTATIN CALCIUM 40 MG PO TABS: 40.0000 mg | ORAL_TABLET | Freq: Every day | ORAL | 3 refills | Status: DC

## 2024-01-24 MED ORDER — TRULICITY 4.5 MG/0.5ML ~~LOC~~ SOAJ: 4.5000 mg | SUBCUTANEOUS | 4 refills | Status: DC

## 2024-01-24 NOTE — Assessment & Plan Note (Signed)
 Chronic, ongoing with A1c 6.4% today and urine ALB 80 (February 2025).  She lost a total of 38 pounds since January 2024.  Continue Losartan  for kidney protection.  Tolerating Trulicity  only, unable to get Mounjaro  anymore due to coverage.  Will continue Metformin  as ordered.  Educated on BS goals in morning and 2 hours after meals.   - On Statin and ARB - Eye and Foot exams up to date - Vaccinations up to date with exception of Shingrix and Td which she wishes to think about - Return in 3 months.

## 2024-01-24 NOTE — Progress Notes (Signed)
 BP 121/79   Pulse 71   Temp 99.5 F (37.5 C) (Oral)   Ht 5' 6 (1.676 m)   Wt 233 lb (105.7 kg)   SpO2 96%   BMI 37.61 kg/m    Subjective:    Patient ID: Lori Knight, female    DOB: 1971-03-10, 53 y.o.   MRN: [id number]  HPI: Lori Knight is a 53 y.o. female  Chief Complaint  Patient presents with   Diabetes   Shoulder Pain    Patient states she has been having pain in her L shoulder blade daily for the last month. States the pain feels sharp at times.    DIABETES August February 6%. Taking Metformin  500 MG BID and Trulicity .  No longer taking Mounjaro  due to cost and not covered.  Is maintaining her weight loss from this. Neuropathy has gotten a bit worse in her feet and off balance. Hypoglycemic episodes:no Polydipsia/polyuria: no Visual disturbance: no Chest pain: no Paresthesias: no Glucose Monitoring: no             Accucheck frequency: not checking             Fasting glucose:              Post prandial:              Evening:             Before meals: Taking Insulin ?: no             Long acting insulin :             Short acting insulin : Blood Pressure Monitoring: not checking Retinal Examination: Not Up to Date -- [facility] [place] Foot Exam: Up to Date Pneumovax: Up to Date Influenza: Up to Date Aspirin: no   SHOULDER PAIN Is having left shoulder pain for one month.   Duration: weeks Involved shoulder: left Mechanism of injury: unknown Location: posterior Onset:sudden Severity: 4/10  Quality:  sharp and throbbing Frequency: intermittent Radiation: no Aggravating factors: nothing  Alleviating factors: rest and Ibuprofen  Status: stable Treatments attempted: rest and ibuprofen   Relief with NSAIDs?:  moderate Weakness: no Numbness: no Decreased grip strength: no Redness: no Swelling: no Bruising: no Fevers: no   HYPERTENSION / HYPERLIPIDEMIA Taking Losartan  100 MG, Amlodipine  5 MG and Rosuvastatin  40 MG QHS. Echo 2019 noted  EF 50-55%.  Follows with neurosurgery, Dr. Bluford, for meningioma -- no changes made -- imaging 09/14/22. This remains stable -- to repeat in 3 years.  Duration of hypertension: chronic BP monitoring frequency: not checking BP range:  BP medication side effects: no Duration of hyperlipidemia: chronic Aspirin: no Recent stressors: no Recurrent headaches: no Visual changes: no Palpitations: no Dyspnea: no Chest pain: no Lower extremity edema: no Dizzy/lightheaded: no The ASCVD Risk score (Arnett DK, et al., 2019) failed to calculate for the following reasons:   Risk score cannot be calculated because patient has a medical history suggesting prior/existing ASCVD   HYPOTHYROIDISM Continues Levothyroxine  150 MCG daily.   Thyroid  control status:stable Satisfied with current treatment? yes Medication side effects: no Medication compliance: good compliance Etiology of hypothyroidism:  Recent dose adjustment: yes Fatigue: no Cold intolerance: no Heat intolerance: no  Weight gain: no Weight loss: no Constipation: no Diarrhea/loose stools: no Palpitations: no Lower extremity edema: no Anxiety/depressed mood: no   BIPOLAR DISORDER Taking Lamictal  BID.  She is working with psychology, last visit 12/07/23. Her psychiatrist left and she is working on getting a  new provider. Mood status: stable Satisfied with current treatment?: yes Symptom severity: moderate  Duration of current treatment : chronic Side effects: no Medication compliance: good compliance Psychotherapy/counseling: at present -- with Elvie Depressed mood: no Anxious mood: yes Anhedonia: no Significant weight loss or gain: no Insomnia: no Fatigue: no Feelings of worthlessness or guilt: no Impaired concentration/indecisiveness: yes Suicidal ideations: no Hopelessness: no Crying spells: no    01/24/2024    3:30 PM 10/31/2023   11:28 AM 08/29/2023    9:24 AM 05/13/2023   11:31 AM 03/10/2023    2:04 PM  Depression  screen PHQ 2/9  Decreased Interest 1 0 0 1   Down, Depressed, Hopeless 0 0 0 1   PHQ - 2 Score 1 0 0 2   Altered sleeping 1 2 2  0   Tired, decreased energy 2 2 2 2    Change in appetite 2 2 2  0   Feeling bad or failure about yourself  0 0 0 0   Trouble concentrating 1 1 2 3    Moving slowly or fidgety/restless 0 0 0 0   Suicidal thoughts 0 0 0 0   PHQ-9 Score 7 7 8 7    Difficult doing work/chores Not difficult at all Not difficult at all Not difficult at all       Information is confidential and restricted. Go to Review Flowsheets to unlock data.       01/24/2024    3:30 PM 10/31/2023   11:28 AM 08/29/2023    9:24 AM 05/13/2023   11:31 AM  GAD 7 : Generalized Anxiety Score  Nervous, Anxious, on Edge 2 1 2 1   Control/stop worrying 1 1 2 2   Worry too much - different things 1 1 2 2   Trouble relaxing 1 1 2 2   Restless 1 1 2 2   Easily annoyed or irritable 2 2 2 3   Afraid - awful might happen 2 1 0 3  Total GAD 7 Score 10 8 12 15   Anxiety Difficulty Not difficult at all Not difficult at all Not difficult at all    Relevant past medical, surgical, family and social history reviewed and updated as indicated. Interim medical history since our last visit reviewed. Allergies and medications reviewed and updated.  Review of Systems  Constitutional:  Negative for activity change, appetite change, diaphoresis, fatigue and fever.  Respiratory:  Negative for cough, chest tightness, shortness of breath and wheezing.   Cardiovascular:  Negative for chest pain, palpitations and leg swelling.  Gastrointestinal: Negative.   Endocrine: Negative for cold intolerance, heat intolerance, polydipsia, polyphagia and polyuria.  Musculoskeletal:  Positive for arthralgias.  Neurological: Negative.   Psychiatric/Behavioral: Negative.     Per HPI unless specifically indicated above     Objective:    BP 121/79   Pulse 71   Temp 99.5 F (37.5 C) (Oral)   Ht 5' 6 (1.676 m)   Wt 233 lb (105.7 kg)    SpO2 96%   BMI 37.61 kg/m   Wt Readings from Last 3 Encounters:  01/24/24 233 lb (105.7 kg)  01/02/24 234 lb 6.4 oz (106.3 kg)  10/31/23 234 lb 6.4 oz (106.3 kg)    Physical Exam Vitals and nursing note reviewed.  Constitutional:      General: She is awake. She is not in acute distress.    Appearance: She is well-developed and well-groomed. She is obese. She is not ill-appearing or toxic-appearing.  HENT:     Head: Normocephalic.  Right Ear: Hearing and external ear normal.     Left Ear: Hearing and external ear normal.  Eyes:     General: Lids are normal.        Right eye: No discharge.        Left eye: No discharge.     Conjunctiva/sclera: Conjunctivae normal.     Pupils: Pupils are equal, round, and reactive to light.  Neck:     Thyroid : No thyromegaly.     Vascular: No carotid bruit.  Cardiovascular:     Rate and Rhythm: Normal rate and regular rhythm.     Heart sounds: Normal heart sounds. No murmur heard.    No gallop.  Pulmonary:     Effort: Pulmonary effort is normal. No accessory muscle usage or respiratory distress.     Breath sounds: Normal breath sounds. No decreased breath sounds, wheezing or rhonchi.  Abdominal:     General: Bowel sounds are normal. There is no distension.     Palpations: Abdomen is soft.     Tenderness: There is no abdominal tenderness.  Musculoskeletal:     Right shoulder: Normal.     Left shoulder: Tenderness (to posterior aspect) present. No swelling, laceration or crepitus. Normal range of motion (discomfort with upward movement and lateral). Normal strength. Normal pulse.     Cervical back: Normal range of motion and neck supple.     Right lower leg: No edema.     Left lower leg: No edema.  Lymphadenopathy:     Cervical: No cervical adenopathy.  Skin:    General: Skin is warm and dry.  Neurological:     Mental Status: She is alert and oriented to person, place, and time.     Deep Tendon Reflexes: Reflexes are normal and  symmetric.     Reflex Scores:      Brachioradialis reflexes are 2+ on the right side and 2+ on the left side.      Patellar reflexes are 2+ on the right side and 2+ on the left side. Psychiatric:        Attention and Perception: Attention normal.        Mood and Affect: Mood normal.        Speech: Speech normal.        Behavior: Behavior normal. Behavior is cooperative.        Thought Content: Thought content normal.    Results for orders placed or performed in visit on 01/24/24  Bayer DCA Hb A1c Waived   Collection Time: 01/24/24  3:29 PM  Result Value Ref Range   HB A1C (BAYER DCA - WAIVED) 6.4 (H) 4.8 - 5.6 %      Assessment & Plan:   Problem List Items Addressed This Visit       Cardiovascular and Mediastinum   Hypertension associated with diabetes (HCC)   Chronic, ongoing. BP well below goal.  Continue current medication regimen and adjust as needed.  Focus on DASH diet and regular exercise at home.  Recommend she check BP at few days a week at home and document for provider.  Losartan  for kidney protection, urine ALB 80 February 2025.  LABS: CMP.        Relevant Medications   amLODipine  (NORVASC ) 5 MG tablet   Dulaglutide  (TRULICITY ) 4.5 MG/0.5ML SOAJ   metFORMIN  (GLUCOPHAGE ) 500 MG tablet   rosuvastatin  (CRESTOR ) 40 MG tablet   Other Relevant Orders   Bayer DCA Hb A1c Waived (Completed)   Aortic atherosclerosis (HCC)  Chronic.  Noted on imaging 03/17/2018.  Recommend continued use of statin daily and consider addition of ASA in future for prevention.      Relevant Medications   amLODipine  (NORVASC ) 5 MG tablet   rosuvastatin  (CRESTOR ) 40 MG tablet     Endocrine   Type 2 diabetes mellitus with proteinuria (HCC)   Chronic, ongoing with A1c 6.4% today and urine ALB 80 (February 2025).  She lost a total of 38 pounds since January 2024.  Continue Losartan  for kidney protection.  Tolerating Trulicity  only, unable to get Mounjaro  anymore due to coverage.  Will continue  Metformin  as ordered.  Educated on BS goals in morning and 2 hours after meals.   - On Statin and ARB - Eye and Foot exams up to date - Vaccinations up to date with exception of Shingrix and Td which she wishes to think about - Return in 3 months.      Relevant Medications   Dulaglutide  (TRULICITY ) 4.5 MG/0.5ML SOAJ   metFORMIN  (GLUCOPHAGE ) 500 MG tablet   omeprazole  (PRILOSEC) 40 MG capsule   rosuvastatin  (CRESTOR ) 40 MG tablet   Other Relevant Orders   Bayer DCA Hb A1c Waived (Completed)   Type 2 diabetes mellitus with morbid obesity (HCC) - Primary   Chronic, ongoing with A1c 6.4% today and urine ALB 80 (February 2025).  She lost a total of 38 pounds since January 2024.  Continue Losartan  for kidney protection.  Tolerating Trulicity  only, unable to get Mounjaro  anymore due to coverage.  Will continue Metformin  as ordered.  Educated on BS goals in morning and 2 hours after meals.   - On Statin and ARB - Eye and Foot exams up to date - Vaccinations up to date with exception of Shingrix and Td which she wishes to think about - Return in 3 months.      Relevant Medications   Dulaglutide  (TRULICITY ) 4.5 MG/0.5ML SOAJ   metFORMIN  (GLUCOPHAGE ) 500 MG tablet   rosuvastatin  (CRESTOR ) 40 MG tablet   Other Relevant Orders   Bayer DCA Hb A1c Waived (Completed)   Hyperlipidemia associated with type 2 diabetes mellitus (HCC)   Chronic, ongoing.  Continue on statin which is offering benefit.  Lipid panel today.       Relevant Medications   amLODipine  (NORVASC ) 5 MG tablet   Dulaglutide  (TRULICITY ) 4.5 MG/0.5ML SOAJ   metFORMIN  (GLUCOPHAGE ) 500 MG tablet   rosuvastatin  (CRESTOR ) 40 MG tablet   Other Relevant Orders   Bayer DCA Hb A1c Waived (Completed)   Comprehensive metabolic panel with GFR   Lipid Panel w/o Chol/HDL Ratio   Hashimoto's thyroiditis   Chronic, ongoing.  Continue Levothyroxine  150 MCG daily.  Educated her on this.  Thyroid  labs stable in April 2025.      Relevant  Medications   levothyroxine  (SYNTHROID ) 150 MCG tablet   Diabetes mellitus treated with injections of non-insulin  medication (HCC)   Refer to diabetes with obesity plan of care.      Relevant Medications   Dulaglutide  (TRULICITY ) 4.5 MG/0.5ML SOAJ   metFORMIN  (GLUCOPHAGE ) 500 MG tablet   rosuvastatin  (CRESTOR ) 40 MG tablet   Other Relevant Orders   Bayer DCA Hb A1c Waived (Completed)     Nervous and Auditory   Meningioma (HCC)   Followed by neurology, continue collaboration with neurology team and recent notes reviewed.        Other   Vitamin D  deficiency   Chronic, ongoing.  Will continue daily supplement and adjust as needed. Labs today.  Relevant Orders   VITAMIN D  25 Hydroxy (Vit-D Deficiency, Fractures)   Obesity   BMI BMI 37.61 with T2DM, HTN/HLD, did lose weight with Mounjaro  and is maintaining this at present with Trulicity .  Recommended eating smaller high protein, low fat meals more frequently and exercising 30 mins a day 5 times a week with a goal of 10-15lb weight loss in the next 3 months. Patient voiced their understanding and motivation to adhere to these recommendations.        Relevant Medications   Dulaglutide  (TRULICITY ) 4.5 MG/0.5ML SOAJ   metFORMIN  (GLUCOPHAGE ) 500 MG tablet   Bipolar I disorder, single manic episode (HCC)   Chronic, stable.  Denies SI/HI.  Followed by psychology only at present, has not found a new psychiatrist.  Will continue this collaboration and current medication regimen.  PCP will refill if needed until she finds new psychiatrist.      Acute pain of left shoulder   Acute, overall stable with no red flags on exam.  No rashes.  Recommend simple treatment at home.  Tylenol  as needed.  Voltaren gel and Icy/Hot patches or Thermacare patches.  May use heating pad as needed.  Will continue to monitor and if any worsening obtain imaging.        Follow up plan: Return in about 3 months (around 04/25/2024) for T2DM, HTN/HLD, MOOD,  THYROID .

## 2024-01-24 NOTE — Assessment & Plan Note (Signed)
Chronic.  Noted on imaging 03/17/2018.  Recommend continued use of statin daily and consider addition of ASA in future for prevention. 

## 2024-01-24 NOTE — Assessment & Plan Note (Signed)
Chronic, ongoing.  Continue on statin which is offering benefit.  Lipid panel today.

## 2024-01-24 NOTE — Assessment & Plan Note (Signed)
 Acute, overall stable with no red flags on exam.  No rashes.  Recommend simple treatment at home.  Tylenol  as needed.  Voltaren gel and Icy/Hot patches or Thermacare patches.  May use heating pad as needed.  Will continue to monitor and if any worsening obtain imaging.

## 2024-01-24 NOTE — Assessment & Plan Note (Signed)
Chronic, ongoing.  Will continue daily supplement and adjust as needed. Labs today.

## 2024-01-24 NOTE — Assessment & Plan Note (Signed)
Followed by neurology, continue collaboration with neurology team and recent notes reviewed. °

## 2024-01-24 NOTE — Assessment & Plan Note (Signed)
 Chronic, ongoing.  Continue Levothyroxine  150 MCG daily.  Educated her on this.  Thyroid  labs stable in April 2025.

## 2024-01-24 NOTE — Assessment & Plan Note (Signed)
 Refer to diabetes with obesity plan of care.

## 2024-01-24 NOTE — Assessment & Plan Note (Signed)
 BMI BMI 37.61 with T2DM, HTN/HLD, did lose weight with Mounjaro  and is maintaining this at present with Trulicity .  Recommended eating smaller high protein, low fat meals more frequently and exercising 30 mins a day 5 times a week with a goal of 10-15lb weight loss in the next 3 months. Patient voiced their understanding and motivation to adhere to these recommendations.

## 2024-01-24 NOTE — Assessment & Plan Note (Signed)
 Chronic, stable.  Denies SI/HI.  Followed by psychology only at present, has not found a new psychiatrist.  Will continue this collaboration and current medication regimen.  PCP will refill if needed until she finds new psychiatrist.

## 2024-01-24 NOTE — Assessment & Plan Note (Signed)
 Chronic, ongoing. BP well below goal.  Continue current medication regimen and adjust as needed.  Focus on DASH diet and regular exercise at home.  Recommend she check BP at few days a week at home and document for provider.  Losartan for kidney protection, urine ALB 80 February 2025.  LABS: CMP.

## 2024-01-25 ENCOUNTER — Ambulatory Visit: Payer: Self-pay | Admitting: Nurse Practitioner

## 2024-01-25 LAB — COMPREHENSIVE METABOLIC PANEL WITH GFR
ALT: 34 IU/L — ABNORMAL HIGH (ref 0–32)
AST: 24 IU/L (ref 0–40)
Albumin: 4.2 g/dL (ref 3.8–4.9)
Alkaline Phosphatase: 92 IU/L (ref 44–121)
BUN/Creatinine Ratio: 13 (ref 9–23)
BUN: 10 mg/dL (ref 6–24)
Bilirubin Total: 0.2 mg/dL (ref 0.0–1.2)
CO2: 23 mmol/L (ref 20–29)
Calcium: 9.6 mg/dL (ref 8.7–10.2)
Chloride: 101 mmol/L (ref 96–106)
Creatinine, Ser: 0.75 mg/dL (ref 0.57–1.00)
Globulin, Total: 2.5 g/dL (ref 1.5–4.5)
Glucose: 96 mg/dL (ref 70–99)
Potassium: 4.2 mmol/L (ref 3.5–5.2)
Sodium: 139 mmol/L (ref 134–144)
Total Protein: 6.7 g/dL (ref 6.0–8.5)
eGFR: 95 mL/min/1.73 (ref >59)

## 2024-01-25 LAB — LIPID PANEL W/O CHOL/HDL RATIO
Cholesterol, Total: 127 mg/dL (ref 100–199)
HDL: 38 mg/dL — ABNORMAL LOW (ref >39)
LDL Chol Calc (NIH): 66 mg/dL (ref 0–99)
Triglycerides: 131 mg/dL (ref 0–149)
VLDL Cholesterol Cal: 23 mg/dL (ref 5–40)

## 2024-01-25 LAB — VITAMIN D 25 HYDROXY (VIT D DEFICIENCY, FRACTURES): Vit D, 25-Hydroxy: 85.5 ng/mL (ref 30.0–100.0)

## 2024-01-25 NOTE — Progress Notes (Signed)
 Contacted via MyChart  Good morning Lori Knight, your labs have returned and overall look great.  There is very mild elevation in one of your liver tests, ALT, but AST is normal.  We can continue to monitor this and ALT has trended down from last check.  Continue all current medications:)  Keep being amazing!!  Thank you for allowing me to participate in your care.  I appreciate you. Kindest regards, Alicianna Litchford

## 2024-01-29 ENCOUNTER — Other Ambulatory Visit: Payer: Self-pay | Admitting: Nurse Practitioner

## 2024-01-31 NOTE — Telephone Encounter (Signed)
 Requested Prescriptions  Pending Prescriptions Disp Refills   losartan  (COZAAR ) 50 MG tablet [Pharmacy Med Name: LOSARTAN  POTASSIUM 50 MG TAB] 60 tablet 5    Sig: TAKE 2 TABLETS BY MOUTH EVERY DAY     Cardiovascular:  Angiotensin Receptor Blockers Passed - 01/31/2024 11:44 AM      Passed - Cr in normal range and within 180 days    Creatinine, Ser  Date Value Ref Range Status  01/24/2024 0.75 0.57 - 1.00 mg/dL Final         Passed - K in normal range and within 180 days    Potassium  Date Value Ref Range Status  01/24/2024 4.2 3.5 - 5.2 mmol/L Final         Passed - Patient is not pregnant      Passed - Last BP in normal range    BP Readings from Last 1 Encounters:  01/24/24 121/79         Passed - Valid encounter within last 6 months    Recent Outpatient Visits           1 week ago Type 2 diabetes mellitus with morbid obesity (HCC)   [place] [facility] [place], Macclenny T, NP   3 months ago Class 2 severe obesity due to excess calories with serious comorbidity and body mass index (BMI) of 37.0 to 37.9 in adult [facility])   [place] [facility] [place], Jolene T, NP   5 months ago Type 2 diabetes mellitus with proteinuria (HCC)   [place] [facility] [place], Melanie DASEN, NP

## 2024-02-02 ENCOUNTER — Encounter: Payer: Self-pay | Admitting: Urology

## 2024-02-02 ENCOUNTER — Other Ambulatory Visit: Payer: Self-pay

## 2024-02-02 DIAGNOSIS — Z87448 Personal history of other diseases of urinary system: Secondary | ICD-10-CM

## 2024-02-04 ENCOUNTER — Encounter: Payer: Self-pay | Admitting: Nurse Practitioner

## 2024-02-06 ENCOUNTER — Ambulatory Visit
Admission: RE | Admit: 2024-02-06 | Discharge: 2024-02-06 | Disposition: A | Discharge status: Home / Self Care | Source: Ambulatory Visit | Attending: Urology | Admitting: Urology

## 2024-02-06 DIAGNOSIS — I700 Atherosclerosis of aorta: Secondary | ICD-10-CM | POA: Diagnosis not present

## 2024-02-06 DIAGNOSIS — Z87448 Personal history of other diseases of urinary system: Secondary | ICD-10-CM

## 2024-02-06 DIAGNOSIS — K5730 Diverticulosis of large intestine without perforation or abscess without bleeding: Secondary | ICD-10-CM | POA: Diagnosis not present

## 2024-02-06 DIAGNOSIS — D1771 Benign lipomatous neoplasm of kidney: Secondary | ICD-10-CM | POA: Diagnosis not present

## 2024-02-06 MED ORDER — IOPAMIDOL (ISOVUE-300) INJECTION 61%
100.0000 mL | Freq: Once | INTRAVENOUS | Status: AC | PRN
  Administered 2024-02-06: 100 mL via INTRAVENOUS

## 2024-02-08 ENCOUNTER — Other Ambulatory Visit: Payer: Self-pay

## 2024-02-09 ENCOUNTER — Ambulatory Visit (INDEPENDENT_AMBULATORY_CARE_PROVIDER_SITE_OTHER): Admitting: Psychology

## 2024-02-09 DIAGNOSIS — F3110 Bipolar disorder, current episode manic without psychotic features, unspecified: Secondary | ICD-10-CM | POA: Diagnosis not present

## 2024-02-09 DIAGNOSIS — F309 Manic episode, unspecified: Secondary | ICD-10-CM

## 2024-02-09 NOTE — Progress Notes (Signed)
 [facility] Counselor/Therapist Progress Note  Patient ID: Lori Knight, MRN: [id number]   Date: 02/09/24  Time Spent: 09:04 am - 9:51 am : 47 Minutes  Treatment Type: Individual Therapy.  Reported Symptoms: Bipolar, depression, and anxiety.   Mental Status Exam: Appearance:  Casual     Behavior: Appropriate  Motor: Normal  Speech/Language:  Clear and Coherent  Affect: Congruent  Mood: anxious  Thought process: normal  Thought content:   WNL  Sensory/Perceptual disturbances:   WNL  Orientation: oriented to person, place, time/date, and situation  Attention: Good  Concentration: Good  Memory: WNL  Fund of knowledge:  Good  Insight:   Good  Judgment:  Good  Impulse Control: Good   Risk Assessment: Danger to Self:  No Self-injurious Behavior: No Danger to Others: No Duty to Warn:no Physical Aggression / Violence:No  Access to Firearms a concern: No  Gang Involvement:No   Subjective:   Lori Knight participated from home, via video and consented to treatment. Therapist participated from home office. Lori Knight noted the events of the past week. She noted missing the past appointment due to a lack of privacy for the session. We processed this during the session. She noted feeling targeted during a recent work meeting and noted feeling reluctant to engage during a follow-up meeting. She noted her feelings regarding this experience but noted meeting one-on-one to provide feedback. She noted anxiety regarding the viability of this professional role due to negative interactions and mixed messages from management. She noted consideration of leaving to a new position but noted anxiety regarding the possible transition, possible system changes, and unknown variables. She noted anxiety about the unknown details and the possible stressors that come with a job change. We worked on exploring this during the session. We worked on reviewing tools to manage rumination. Therapist  normalized anxiety during times of transition and noted the importance of focus on symptom management. Lori Knight was engaged and motivated during the session. She expressed commitment towards goals. Therapist praised Lori Knight for her effort during the session and encouraged mindfulness of mood. Therapist provided supportive therapy and a follow-up was scheduled for continued treatment, which she benefits from.   Interventions: CBT and interpersonal  Diagnosis:  Bipolar I disorder, single manic episode Lori Knight)  Psychiatric Treatment: Yes , Lori Knight, M.D. ([facility])    Treatment Plan:  Client Abilities/Strengths Lori Knight is intelligent, consistent, and motivated for change.   Support System: Family and friends.   Client Treatment Preferences Outpatient Therapy.   Client Statement of Needs Lori Knight would like to process events, bolster coping skills, reduce negative self-talk,  improve frustration tolerance, verbalizing thoughts and feelings, being more mindful, self-care, working through Lori Knight, managing cognitive distortions (jumps to conclusions), Identify areas of control and lack of control, and navigate interpersonal stressors.   Treatment Level Weekly  Symptoms  Depression:  feeling down, hypersomia, lethargy, difficulty concentrating.  (Status: maintained) Anxiety: feeling anxious, difficulty managing worry, worrying about different things, irritability, and feeling afraid something awful might happen .   (Status: maintained)  Goals:   Lori experiences symptoms of   Treatment plan signed and available on s-drive:  No, pending signature.    Target Date: 03/27/24 Frequency: Weekly  Progress: 10% Modality: individual    Therapist will provide referrals for additional resources as appropriate.  Therapist will provide psycho-education regarding Lori Knight's diagnosis and corresponding treatment approaches and interventions. Licensed Clinical Social Worker, Lori Knight, Lori Knight  will support the patient's ability to achieve the goals identified. will  employ CBT, BA, Problem-solving, Solution Focused, Mindfulness,  coping skills, & other evidenced-based practices will be used to promote progress towards healthy functioning to help manage decrease symptoms associated with her diagnosis.   Reduce overall level, frequency, and intensity of the feelings of depression, anxiety and panic evidenced by decreased overall symptoms from 6 to 7 days/week to 0 to 1 days/week per client report for at least 3 consecutive months. Verbally express understanding of the relationship between feelings of depression, anxiety and their impact on thinking patterns and behaviors. Verbalize an understanding of the role that distorted thinking plays in creating fears, excessive worry, and ruminations.  Lori Knight participated in the creation of the treatment plan)   Lori Knight, Lori Knight

## 2024-02-12 ENCOUNTER — Ambulatory Visit: Payer: Self-pay | Admitting: Urology

## 2024-02-12 ENCOUNTER — Encounter: Payer: Self-pay | Admitting: Nurse Practitioner

## 2024-02-12 DIAGNOSIS — D1771 Benign lipomatous neoplasm of kidney: Secondary | ICD-10-CM

## 2024-03-06 ENCOUNTER — Ambulatory Visit (INDEPENDENT_AMBULATORY_CARE_PROVIDER_SITE_OTHER): Admitting: Psychology

## 2024-03-06 DIAGNOSIS — F309 Manic episode, unspecified: Secondary | ICD-10-CM

## 2024-03-06 DIAGNOSIS — F3110 Bipolar disorder, current episode manic without psychotic features, unspecified: Secondary | ICD-10-CM

## 2024-03-06 NOTE — Progress Notes (Signed)
 [facility] Counselor/Therapist Progress Note  Patient ID: Lori Knight, MRN: [id number]   Date: 03/06/24  Time Spent: 09:02 am - 9:38 am : 36 Minutes  Treatment Type: Individual Therapy.  Reported Symptoms: Bipolar, depression, and anxiety.   Mental Status Exam: Appearance:  Casual     Behavior: Appropriate  Motor: Normal  Speech/Language:  Clear and Coherent  Affect: Congruent  Mood: anxious  Thought process: normal  Thought content:   WNL  Sensory/Perceptual disturbances:   WNL  Orientation: oriented to person, place, time/date, and situation  Attention: Good  Concentration: Good  Memory: WNL  Fund of knowledge:  Good  Insight:   Good  Judgment:  Good  Impulse Control: Good   Risk Assessment: Danger to Self:  No Self-injurious Behavior: No Danger to Others: No Duty to Warn:no Physical Aggression / Violence:No  Access to Firearms a concern: No  Gang Involvement:No   Subjective:   [name] participated from home, via video and consented to treatment. Therapist participated from office. Vinita noted the events of the past week. She noted her continued consideration of leaving her current job and pursuing a different opportunity. She noted the various changes that are leading her to consider the move. We worked on exploring the pros and cons of a change during the session. She noted a need for a transition to be financially and logistically viable. We worked on exploring and processing her various needs during the session. She noted some improvement in her anxiety upon her discontinuation of Minjaro. She noted maintaining her weight-loss since that time via her diet. She noted improvement in her daughter's overall mood and functioning. Therapist validated Belinda's feelings and experience, encouraged continued self-care. Jaedynn was engaged and motivated during the session. Therapist provided supportive therapy and a follow-up was scheduled for continued  treatment which she benefits from.   Interventions: CBT and interpersonal  Diagnosis:  Bipolar I disorder, single manic episode [facility])  Psychiatric Treatment: Yes , Toribio Murdoch, M.D. ([facility])    Treatment Plan:  Client Abilities/Strengths Kenitha is intelligent, consistent, and motivated for change.   Support System: Family and friends.   Client Treatment Preferences Outpatient Therapy.   Client Statement of Needs Leyda would like to process events, bolster coping skills, reduce negative self-talk,  improve frustration tolerance, verbalizing thoughts and feelings, being more mindful, self-care, working through [name], managing cognitive distortions (jumps to conclusions), Identify areas of control and lack of control, and navigate interpersonal stressors.   Treatment Level Weekly  Symptoms  Depression:  feeling down, hypersomia, lethargy, difficulty concentrating.  (Status: maintained) Anxiety: feeling anxious, difficulty managing worry, worrying about different things, irritability, and feeling afraid something awful might happen .   (Status: maintained)  Goals:   Vina experiences symptoms of   Treatment plan signed and available on s-drive:  No, pending signature.    Target Date: 03/27/24 Frequency: Weekly  Progress: 10% Modality: individual    Therapist will provide referrals for additional resources as appropriate.  Therapist will provide psycho-education regarding Maleny's diagnosis and corresponding treatment approaches and interventions. Licensed Clinical Social Worker, [place], LCSW will support the patient's ability to achieve the goals identified. will employ CBT, BA, Problem-solving, Solution Focused, Mindfulness,  coping skills, & other evidenced-based practices will be used to promote progress towards healthy functioning to help manage decrease symptoms associated with her diagnosis.   Reduce overall level, frequency, and intensity of the  feelings of depression, anxiety and panic evidenced by decreased overall symptoms from 6  to 7 days/week to 0 to 1 days/week per client report for at least 3 consecutive months. Verbally express understanding of the relationship between feelings of depression, anxiety and their impact on thinking patterns and behaviors. Verbalize an understanding of the role that distorted thinking plays in creating fears, excessive worry, and ruminations.  [name])   Elvie Mullet, LCSW

## 2024-03-07 ENCOUNTER — Ambulatory Visit: Admitting: Nurse Practitioner

## 2024-03-14 ENCOUNTER — Encounter: Payer: Self-pay | Admitting: Nurse Practitioner

## 2024-03-14 NOTE — Patient Instructions (Signed)

## 2024-03-16 ENCOUNTER — Ambulatory Visit (INDEPENDENT_AMBULATORY_CARE_PROVIDER_SITE_OTHER): Admitting: Nurse Practitioner

## 2024-03-16 VITALS — BP 123/77 | HR 72 | Temp 98.8°F | Resp 16 | Ht 66.0 in | Wt 233.0 lb

## 2024-03-16 DIAGNOSIS — R399 Unspecified symptoms and signs involving the genitourinary system: Secondary | ICD-10-CM | POA: Diagnosis not present

## 2024-03-16 DIAGNOSIS — R8281 Pyuria: Secondary | ICD-10-CM | POA: Diagnosis not present

## 2024-03-16 LAB — URINALYSIS, ROUTINE W REFLEX MICROSCOPIC
Bilirubin, UA: NEGATIVE
Glucose, UA: NEGATIVE
Ketones, UA: NEGATIVE
Nitrite, UA: NEGATIVE
Protein,UA: NEGATIVE
RBC, UA: NEGATIVE
Specific Gravity, UA: 1.01 (ref 1.005–1.030)
Urobilinogen, Ur: 0.2 mg/dL (ref 0.2–1.0)
pH, UA: 6 (ref 5.0–7.5)

## 2024-03-16 LAB — WET PREP FOR TRICH, YEAST, CLUE
Clue Cell Exam: NEGATIVE
Trichomonas Exam: NEGATIVE
Yeast Exam: NEGATIVE

## 2024-03-16 LAB — MICROSCOPIC EXAMINATION
Bacteria, UA: NONE SEEN
RBC, Urine: NONE SEEN /HPF (ref 0–2)

## 2024-03-16 NOTE — Assessment & Plan Note (Signed)
 Acute since having a GI bug.  UA today with trace Leuks only.  Wet prep negative.  Will send urine for culture and treat as needed.  Discussed results and plan with patient.

## 2024-03-16 NOTE — Progress Notes (Signed)
 BP 123/77 (BP Location: Left Arm, Patient Position: Sitting, Cuff Size: Normal)   Pulse 72   Temp 98.8 F (37.1 C) (Oral)   Resp 16   Ht 5' 6 (1.676 m)   Wt 233 lb (105.7 kg)   SpO2 99%   BMI 37.61 kg/m    Subjective:    Patient ID: Lori Knight, female    DOB: 1970/07/29, 53 y.o.   MRN: [id number]  HPI: Lori Knight is a 53 y.o. female  Chief Complaint  Patient presents with   Urinary Tract Infection   URINARY SYMPTOMS Feels pressure often.  Has food poisoning a couple weeks ago, for 2 days of symptoms.  She is concerned this may have triggered a urine infection. Dysuria: no Urinary frequency: yes Urgency: yes Small volume voids: no Symptom severity: yes Urinary incontinence: no Foul odor: yes Hematuria: no Abdominal pain: no Back pain: yes Suprapubic pain/pressure: yes Flank pain: no Fever:  no Vomiting: yes Status: stable Previous urinary tract infection: yes Recurrent urinary tract infection: no Sexual activity: monogamous History of sexually transmitted disease: no Treatments attempted: increasing fluids    Relevant past medical, surgical, family and social history reviewed and updated as indicated. Interim medical history since our last visit reviewed. Allergies and medications reviewed and updated.  Review of Systems  Constitutional:  Negative for activity change, appetite change, diaphoresis, fatigue and fever.  Respiratory:  Negative for cough, chest tightness, shortness of breath and wheezing.   Cardiovascular:  Negative for chest pain, palpitations and leg swelling.  Gastrointestinal: Negative.   Genitourinary:  Negative for decreased urine volume, difficulty urinating, dysuria, flank pain, hematuria, pelvic pain and urgency.  Neurological: Negative.   Psychiatric/Behavioral: Negative.      Per HPI unless specifically indicated above     Objective:    BP 123/77 (BP Location: Left Arm, Patient Position: Sitting, Cuff Size: Normal)    Pulse 72   Temp 98.8 F (37.1 C) (Oral)   Resp 16   Ht 5' 6 (1.676 m)   Wt 233 lb (105.7 kg)   SpO2 99%   BMI 37.61 kg/m   Wt Readings from Last 3 Encounters:  03/16/24 233 lb (105.7 kg)  01/24/24 233 lb (105.7 kg)  01/02/24 234 lb 6.4 oz (106.3 kg)    Physical Exam Vitals and nursing note reviewed.  Constitutional:      General: She is awake. She is not in acute distress.    Appearance: She is well-developed and well-groomed. She is obese. She is not ill-appearing or toxic-appearing.  HENT:     Head: Normocephalic.     Right Ear: Hearing and external ear normal.     Left Ear: Hearing and external ear normal.  Eyes:     General: Lids are normal.        Right eye: No discharge.        Left eye: No discharge.     Conjunctiva/sclera: Conjunctivae normal.     Pupils: Pupils are equal, round, and reactive to light.  Neck:     Thyroid : No thyromegaly.     Vascular: No carotid bruit.  Cardiovascular:     Rate and Rhythm: Normal rate and regular rhythm.     Heart sounds: Normal heart sounds. No murmur heard.    No gallop.  Pulmonary:     Effort: Pulmonary effort is normal. No accessory muscle usage or respiratory distress.     Breath sounds: Normal breath sounds.  Abdominal:     General:  Bowel sounds are normal. There is no distension.     Palpations: Abdomen is soft.     Tenderness: There is no abdominal tenderness. There is no right CVA tenderness or left CVA tenderness.  Musculoskeletal:     Cervical back: Normal range of motion and neck supple.     Right lower leg: No edema.     Left lower leg: No edema.  Lymphadenopathy:     Cervical: No cervical adenopathy.  Skin:    General: Skin is warm and dry.  Neurological:     Mental Status: She is alert and oriented to person, place, and time.     Deep Tendon Reflexes: Reflexes are normal and symmetric.     Reflex Scores:      Brachioradialis reflexes are 2+ on the right side and 2+ on the left side.      Patellar  reflexes are 2+ on the right side and 2+ on the left side. Psychiatric:        Attention and Perception: Attention normal.        Mood and Affect: Mood normal.        Speech: Speech normal.        Behavior: Behavior normal. Behavior is cooperative.        Thought Content: Thought content normal.     Results for orders placed or performed in visit on 01/24/24  Bayer DCA Hb A1c Waived   Collection Time: 01/24/24  3:29 PM  Result Value Ref Range   HB A1C (BAYER DCA - WAIVED) 6.4 (H) 4.8 - 5.6 %  Comprehensive metabolic panel with GFR   Collection Time: 01/24/24  3:32 PM  Result Value Ref Range   Glucose 96 70 - 99 mg/dL   BUN 10 6 - 24 mg/dL   Creatinine, Ser 9.24 0.57 - 1.00 mg/dL   eGFR 95 >40 fO/fpw/8.26   BUN/Creatinine Ratio 13 9 - 23   Sodium 139 134 - 144 mmol/L   Potassium 4.2 3.5 - 5.2 mmol/L   Chloride 101 96 - 106 mmol/L   CO2 23 20 - 29 mmol/L   Calcium  9.6 8.7 - 10.2 mg/dL   Total Protein 6.7 6.0 - 8.5 g/dL   Albumin 4.2 3.8 - 4.9 g/dL   Globulin, Total 2.5 1.5 - 4.5 g/dL   Bilirubin Total 0.2 0.0 - 1.2 mg/dL   Alkaline Phosphatase 92 44 - 121 IU/L   AST 24 0 - 40 IU/L   ALT 34 (H) 0 - 32 IU/L  Lipid Panel w/o Chol/HDL Ratio   Collection Time: 01/24/24  3:32 PM  Result Value Ref Range   Cholesterol, Total 127 100 - 199 mg/dL   Triglycerides 868 0 - 149 mg/dL   HDL 38 (L) >60 mg/dL   VLDL Cholesterol Cal 23 5 - 40 mg/dL   LDL Chol Calc (NIH) 66 0 - 99 mg/dL  VITAMIN D  25 Hydroxy (Vit-D Deficiency, Fractures)   Collection Time: 01/24/24  3:32 PM  Result Value Ref Range   Vit D, 25-Hydroxy 85.5 30.0 - 100.0 ng/mL      Assessment & Plan:   Problem List Items Addressed This Visit       Other   Urinary symptom or sign - Primary   Acute since having a GI bug.  UA today with trace Leuks only.  Wet prep negative.  Will send urine for culture and treat as needed.  Discussed results and plan with patient.      Relevant Orders  WET PREP FOR TRICH, YEAST,  CLUE   Urinalysis, Routine w reflex microscopic   Other Visit Diagnoses       Pyuria       Send urine for culture   Relevant Orders   Urine Culture        Follow up plan: Return if symptoms worsen or fail to improve.

## 2024-03-18 ENCOUNTER — Ambulatory Visit: Payer: Self-pay | Admitting: Nurse Practitioner

## 2024-03-18 LAB — URINE CULTURE

## 2024-03-18 NOTE — Progress Notes (Signed)
 Contacted via MyChart   No urine infection present:)

## 2024-03-19 LAB — OPHTHALMOLOGY REPORT-SCANNED

## 2024-03-27 ENCOUNTER — Ambulatory Visit (INDEPENDENT_AMBULATORY_CARE_PROVIDER_SITE_OTHER): Admitting: Psychology

## 2024-03-27 DIAGNOSIS — F309 Manic episode, unspecified: Secondary | ICD-10-CM

## 2024-03-27 DIAGNOSIS — F3110 Bipolar disorder, current episode manic without psychotic features, unspecified: Secondary | ICD-10-CM

## 2024-03-27 NOTE — Progress Notes (Signed)
 [facility] Counselor/Therapist Progress Note  Patient ID: Lori Knight, MRN: [id number]   Date: 03/27/24  Time Spent: 3:06 pm - 3:49 pm : 43 Minutes  Treatment Type: Individual Therapy.  Reported Symptoms: Bipolar, depression, and anxiety.   Mental Status Exam: Appearance:  Casual     Behavior: Appropriate  Motor: Normal  Speech/Language:  Clear and Coherent  Affect: Congruent  Mood: anxious  Thought process: normal  Thought content:   WNL  Sensory/Perceptual disturbances:   WNL  Orientation: oriented to person, place, time/date, and situation  Attention: Good  Concentration: Good  Memory: WNL  Fund of knowledge:  Good  Insight:   Good  Judgment:  Good  Impulse Control: Good   Risk Assessment: Danger to Self:  No Self-injurious Behavior: No Danger to Others: No Duty to Warn:no Physical Aggression / Violence:No  Access to Firearms a concern: No  Gang Involvement:No   Subjective:   [name] participated from home, via video and consented to treatment. Therapist participated from office. Joellyn noted the events of the past week. She noted feeling depressed and discussed that she has been feeling this way for sometime but blaming it on various issues. She noted not feeling right and noted a need to meet with a psychiatric provider. She noted her more recent psychiatric provider leaving the practice ~5 months ago. She noted a barrier to this being that she does not like change and noted worry about having to try different medications again. She noted feeling more depressed and agitated. We worked on exploring this during the session. Therapist will provide Maritsa with a list of local providers for her to review and consider going forward. Ether was tearful during the session as she discussed the recent political events. We worked on exploring this during the session. She noted this being uncertain and frightening to her. She noted feeling out of  control and noted worry that this will escalate going forward. We worked on exploring this during the session and identifying the evidence for and against this assertion. Therapist highlighted the importance of managing this anxiety, highlighting the areas of control and lack of control, and being mindful the possibility vs probability during the session. Therapist provided psycho-education regarding Anxiety. Therapist encouraged Elliona to proactively manage her symptoms. Therapist provided supportive therapy. A follow-up was scheduled for continued treatment, which she benefits from.   Interventions: CBT Diagnosis:  Bipolar I disorder, single manic episode [facility])  Psychiatric Treatment: Yes , Toribio Murdoch, M.D. ([facility])    Treatment Plan:  Client Abilities/Strengths Kasidy is intelligent, consistent, and motivated for change.   Support System: Family and friends.   Client Treatment Preferences Outpatient Therapy.   Client Statement of Needs Christinamarie would like to process events, bolster coping skills, reduce negative self-talk,  improve frustration tolerance, verbalizing thoughts and feelings, being more mindful, self-care, working through [name], managing cognitive distortions (jumps to conclusions), Identify areas of control and lack of control, and navigate interpersonal stressors.   Treatment Level Weekly  Symptoms  Depression:  feeling down, hypersomia, lethargy, difficulty concentrating.  (Status: maintained) Anxiety: feeling anxious, difficulty managing worry, worrying about different things, irritability, and feeling afraid something awful might happen .   (Status: maintained)  Goals:   Vina experiences symptoms of   Treatment plan signed and available on s-drive:  No, pending signature.    Target Date: 9/31/25 Frequency: Weekly  Progress: 10% Modality: individual    Therapist will provide referrals for additional resources as appropriate.  Therapist  will  provide psycho-education regarding Khristen's diagnosis and corresponding treatment approaches and interventions. Licensed Clinical Social Worker, [place], LCSW will support the patient's ability to achieve the goals identified. will employ CBT, BA, Problem-solving, Solution Focused, Mindfulness,  coping skills, & other evidenced-based practices will be used to promote progress towards healthy functioning to help manage decrease symptoms associated with her diagnosis.   Reduce overall level, frequency, and intensity of the feelings of depression, anxiety and panic evidenced by decreased overall symptoms from 6 to 7 days/week to 0 to 1 days/week per client report for at least 3 consecutive months. Verbally express understanding of the relationship between feelings of depression, anxiety and their impact on thinking patterns and behaviors. Verbalize an understanding of the role that distorted thinking plays in creating fears, excessive worry, and ruminations.  [name])   Elvie Mullet, LCSW

## 2024-03-31 ENCOUNTER — Other Ambulatory Visit: Payer: Self-pay | Admitting: Nurse Practitioner

## 2024-04-02 NOTE — Telephone Encounter (Signed)
 Labs in date.  Requested Prescriptions  Pending Prescriptions Disp Refills   lubiprostone  (AMITIZA ) 24 MCG capsule [Pharmacy Med Name: LUBIPROSTONE  24 MCG CAPSULE] 60 capsule 4    Sig: TAKE 1 CAPSULE (24 MCG TOTAL) BY MOUTH 2 (TWO) TIMES DAILY WITH A MEAL.     Gastroenterology: Irritable Bowel Syndrome - lubiprostone  Failed - 04/02/2024 11:30 AM      Failed - ALT in normal range and within 360 days    ALT  Date Value Ref Range Status  01/24/2024 34 (H) 0 - 32 IU/L Final         Passed - AST in normal range and within 360 days    AST  Date Value Ref Range Status  01/24/2024 24 0 - 40 IU/L Final         Passed - Valid encounter within last 12 months    Recent Outpatient Visits           2 weeks ago Urinary symptom or sign   [place] [facility] [place], Emory T, NP   2 months ago Type 2 diabetes mellitus with morbid obesity (HCC)   [place] [facility] [place], Lindy T, NP   5 months ago Class 2 severe obesity due to excess calories with serious comorbidity and body mass index (BMI) of 37.0 to 37.9 in adult [facility])   [place] [facility] [place], Jolene T, NP   7 months ago Type 2 diabetes mellitus with proteinuria [facility])   [place] [facility] [place], Melanie DASEN, NP

## 2024-04-13 ENCOUNTER — Ambulatory Visit (INDEPENDENT_AMBULATORY_CARE_PROVIDER_SITE_OTHER): Admitting: Psychology

## 2024-04-13 DIAGNOSIS — F309 Manic episode, unspecified: Secondary | ICD-10-CM

## 2024-04-13 DIAGNOSIS — F319 Bipolar disorder, unspecified: Secondary | ICD-10-CM

## 2024-04-13 NOTE — Progress Notes (Signed)
 [facility] Counselor/Therapist Progress Note  Patient ID: Lori Knight, MRN: [id number]   Date: 04/13/24  Time Spent: 10:03 am - 10:37 am : 34 Minutes  Treatment Type: Individual Therapy.  Reported Symptoms: Bipolar, depression, and anxiety.   Mental Status Exam: Appearance:  Casual     Behavior: Appropriate  Motor: Normal  Speech/Language:  Clear and Coherent  Affect: Congruent  Mood: anxious  Thought process: normal  Thought content:   WNL  Sensory/Perceptual disturbances:   WNL  Orientation: oriented to person, place, time/date, and situation  Attention: Good  Concentration: Good  Memory: WNL  Fund of knowledge:  Good  Insight:   Good  Judgment:  Good  Impulse Control: Good   Risk Assessment: Danger to Self:  No Self-injurious Behavior: No Danger to Others: No Duty to Warn:no Physical Aggression / Violence:No  Access to Firearms a concern: No  Gang Involvement:No   Subjective:   Lori Knight participated from home, via video and consented to treatment. Therapist participated from office. Lori Knight noted the events of the past week. Lori Knight noted feeling overwhelmed by work tasks that have to be addressed. 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 She noted some deals in limbo due to the market. We worked on reviewing how she has been coping with her stress. 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 She noted her husband continues to work but noted that finances have been much tighter and noted a conversation with her husband to pursue a more work, which he has. She noted her husband's interest in traveling but Lori Knight noted being against this due to financial implications. She noted working on following up with a psychiatrist but noted objection to the various releases that she needs to sign. We worked on identifying a plan of  action to address this need and Lori Knight noted her intent to contact the provider to inquire about the paperwork or alternatively identify a provider within the practice that she is is already connected to. Therapist validated Lori Knight's feelings and experience during the session, encouraged self-care, and provided supportive therapy. A Follow-up was scheduled for continued treatment which Lori Knight benefits from. Lori Knight will work on completing the paperwork supplied by the office to facilitate the annual reevaluation.    Interventions: CBT Diagnosis:  Bipolar I disorder, single manic episode [facility])  Psychiatric Treatment: Yes , Lori Knight, M.D. ([facility])    Treatment Plan:  Client Abilities/Strengths Lori Knight is intelligent, consistent, and motivated for change.   Support System: Family and friends.   Client Treatment Preferences Outpatient Therapy.   Client Statement of Needs Lori Knight would like to process events, bolster coping skills, reduce negative self-talk,  improve frustration tolerance, verbalizing thoughts and feelings, being more mindful, self-care, working through [name], managing cognitive distortions (jumps to conclusions), Identify areas of control and lack of control, and navigate interpersonal stressors.   Treatment Level Weekly  Symptoms  Depression:  feeling down, hypersomia, lethargy, difficulty concentrating.  (Status: maintained) Anxiety: feeling anxious, difficulty managing worry, worrying about different things, irritability, and feeling afraid something awful might happen .   (Status: maintained)  Goals:   Lori experiences symptoms of   Treatment plan signed and available on s-drive:  No, pending signature.    Target Date: 04/24/24 Frequency: Weekly  Progress: 10% Modality: individual    Therapist will provide referrals for additional resources as  appropriate.  Therapist will provide psycho-education regarding Lori Knight's diagnosis and corresponding  treatment approaches and interventions. Licensed Clinical Social Worker, [place], LCSW will support the patient's ability to achieve the goals identified. will employ CBT, BA, Problem-solving, Solution Focused, Mindfulness,  coping skills, & other evidenced-based practices will be used to promote progress towards healthy functioning to help manage decrease symptoms associated with her diagnosis.   Reduce overall level, frequency, and intensity of the feelings of depression, anxiety and panic evidenced by decreased overall symptoms from 6 to 7 days/week to 0 to 1 days/week per client report for at least 3 consecutive months. Verbally express understanding of the relationship between feelings of depression, anxiety and their impact on thinking patterns and behaviors. Verbalize an understanding of the role that distorted thinking plays in creating fears, excessive worry, and ruminations.  Lori Knight participated in the creation of the treatment plan)   Elvie Mullet, LCSW

## 2024-04-22 NOTE — Patient Instructions (Incomplete)

## 2024-04-25 DIAGNOSIS — Z86018 Personal history of other benign neoplasm: Secondary | ICD-10-CM | POA: Diagnosis not present

## 2024-04-25 DIAGNOSIS — L720 Epidermal cyst: Secondary | ICD-10-CM | POA: Diagnosis not present

## 2024-04-25 DIAGNOSIS — Z872 Personal history of diseases of the skin and subcutaneous tissue: Secondary | ICD-10-CM | POA: Diagnosis not present

## 2024-04-25 DIAGNOSIS — L578 Other skin changes due to chronic exposure to nonionizing radiation: Secondary | ICD-10-CM | POA: Diagnosis not present

## 2024-04-26 ENCOUNTER — Ambulatory Visit: Admitting: Nurse Practitioner

## 2024-04-26 DIAGNOSIS — E063 Autoimmune thyroiditis: Secondary | ICD-10-CM

## 2024-04-26 DIAGNOSIS — E119 Type 2 diabetes mellitus without complications: Secondary | ICD-10-CM

## 2024-04-26 DIAGNOSIS — D329 Benign neoplasm of meninges, unspecified: Secondary | ICD-10-CM

## 2024-04-26 DIAGNOSIS — F309 Manic episode, unspecified: Secondary | ICD-10-CM

## 2024-04-26 DIAGNOSIS — E1169 Type 2 diabetes mellitus with other specified complication: Secondary | ICD-10-CM

## 2024-04-26 DIAGNOSIS — E1129 Type 2 diabetes mellitus with other diabetic kidney complication: Secondary | ICD-10-CM

## 2024-04-26 DIAGNOSIS — Z6839 Body mass index (BMI) 39.0-39.9, adult: Secondary | ICD-10-CM

## 2024-04-26 DIAGNOSIS — G4733 Obstructive sleep apnea (adult) (pediatric): Secondary | ICD-10-CM

## 2024-04-26 DIAGNOSIS — I152 Hypertension secondary to endocrine disorders: Secondary | ICD-10-CM

## 2024-04-27 ENCOUNTER — Ambulatory Visit: Admitting: Psychology

## 2024-04-30 ENCOUNTER — Ambulatory Visit
Admission: EM | Admit: 2024-04-30 | Discharge: 2024-04-30 | Disposition: A | Discharge status: Home / Self Care | Attending: Physician Assistant | Admitting: Physician Assistant

## 2024-04-30 ENCOUNTER — Encounter: Payer: Self-pay | Admitting: Emergency Medicine

## 2024-04-30 DIAGNOSIS — M545 Low back pain, unspecified: Secondary | ICD-10-CM | POA: Diagnosis not present

## 2024-04-30 DIAGNOSIS — J029 Acute pharyngitis, unspecified: Secondary | ICD-10-CM | POA: Diagnosis not present

## 2024-04-30 DIAGNOSIS — J9801 Acute bronchospasm: Secondary | ICD-10-CM | POA: Insufficient documentation

## 2024-04-30 DIAGNOSIS — R051 Acute cough: Secondary | ICD-10-CM | POA: Diagnosis not present

## 2024-04-30 LAB — SARS CORONAVIRUS 2 BY RT PCR: SARS Coronavirus 2 by RT PCR: NEGATIVE

## 2024-04-30 LAB — GROUP A STREP BY PCR: Group A Strep by PCR: NOT DETECTED

## 2024-04-30 MED ORDER — BACLOFEN 10 MG PO TABS: 10.0000 mg | ORAL_TABLET | Freq: Three times a day (TID) | ORAL | 0 refills | Status: AC | PRN

## 2024-04-30 MED ORDER — ALBUTEROL SULFATE HFA 108 (90 BASE) MCG/ACT IN AERS: 1.0000 | INHALATION_SPRAY | Freq: Four times a day (QID) | RESPIRATORY_TRACT | 0 refills | Status: AC | PRN

## 2024-04-30 MED ORDER — PROMETHAZINE-DM 6.25-15 MG/5ML PO SYRP: 5.0000 mL | ORAL_SOLUTION | Freq: Four times a day (QID) | ORAL | 0 refills | Status: DC | PRN

## 2024-04-30 NOTE — ED Provider Notes (Signed)
 [facility]    CSN: [id number] Arrival date & time: 04/30/24  1709      History   Chief Complaint Chief Complaint  Patient presents with   Back Pain   Sore Throat    HPI Lori Knight is a 53 y.o. female presenting for low back pain x 3 days. No injury.  No associated urinary symptoms, chest pain or shortness of breath.  Also reports sudden onset of cough, sore throat and burning sensation in chest after getting into her newly detailed car.  She says it had a very strong new car smell and she thinks this triggered something.  Reports getting into coughing fits which caused her to feel short of breath.  No wheezing.  Denies fever, fatigue, body aches, headaches.  Patient has not had any congestion.  She says she does not feel sick and thinks this is something different.  She has no history of asthma, reactive airway disease, COPD.  Former smoker.  Denies sick contacts.  No other complaints.  HPI  Past Medical History:  Diagnosis Date   Adult hypothyroidism 06/21/2012   Anxiety    Back pain    Benign paroxysmal positional nystagmus    Bilateral polycystic ovarian syndrome 06/21/2012   Biliary calculus with cholecystitis 08/14/2012   Bipolar 1 disorder (HCC)    BP (high blood pressure) 02/14/2014   Bronchitis    Constipation    COVID-19 07/2020   Depression    Diabetes mellitus (HCC) 08/14/2012   Diabetes mellitus without complication (HCC)    pre-diabetic   Dysplastic nevus 2023   posterior pelvis sacral area-mild atypia   Dysplastic nevus 2019   right suprascapular back-mild atypia   Dysplastic nevus 2019   left posterior thigh - mild atypia   Dysrhythmia    wore heart monitor 2016. Corrected by changing Levothyroxine  dose.   Edema of both lower extremities    Gallbladder problem    GERD (gastroesophageal reflux disease)    Heart murmur    followed by PCP-AS A CHILD-ASYMPTOMATIC   Hyperlipidemia    Hyperprolactinemia 06/21/2012   Hypertension     Hypothyroid    Hypoxia 04/17/2018   Joint pain    Kidney problem    Meningioma (HCC)    Motion sickness    carnival rides   Neuropathy    PONV (postoperative nausea and vomiting)    Low BP after sinus surgery. WAKES UP CRYING   Shortness of breath dyspnea    stairs. related to wt.   Sleep apnea    has CPAP. has not used since 2011   Swallowing difficulty    Vertigo 2016   none recently   Vitamin D  deficiency     Patient Active Problem List   Diagnosis Date Noted   Acute pain of left shoulder 01/24/2024   Diabetes mellitus treated with injections of non-insulin  medication (HCC) 08/27/2023   Constipation 07/11/2023   Right shoulder pain 12/02/2022   Macromastia 03/04/2022   Chronic midline low back pain without sciatica 01/15/2022   Hemorrhoids, external 12/02/2021   Aortic atherosclerosis 09/30/2020   Type 2 diabetes mellitus with proteinuria (HCC) 02/20/2020   Meningioma (HCC) 12/24/2019   Hyperlipidemia associated with type 2 diabetes mellitus (HCC) 08/13/2019   Gastroesophageal reflux disease without esophagitis 06/20/2019   Type 2 diabetes mellitus with morbid obesity (HCC) 12/01/2015   OSA on CPAP 09/22/2015   Internal hemorrhoids 08/28/2015   Vitamin D  deficiency 08/28/2015   Obesity 08/27/2015   Hypertension associated with  diabetes (HCC) 02/14/2014   Benign paroxysmal positional nystagmus 02/14/2014   Angiomyolipoma of both kidneys 08/14/2012   Bipolar I disorder, single manic episode (HCC) 08/14/2012   Hashimoto's thyroiditis 06/21/2012   Bilateral polycystic ovarian syndrome 06/21/2012    Past Surgical History:  Procedure Laterality Date   CHOLECYSTECTOMY     COLONOSCOPY WITH PROPOFOL  N/A 06/19/2015   Procedure: COLONOSCOPY WITH PROPOFOL ;  Surgeon: Rogelia Copping, MD;  Location: [facility];  Service: Endoscopy;  Laterality: N/A;  Diabetic - oral meds    DENTAL SURGERY     ESOPHAGOGASTRODUODENOSCOPY (EGD) WITH PROPOFOL  N/A 01/16/2021   Procedure:  ESOPHAGOGASTRODUODENOSCOPY (EGD) WITH PROPOFOL ;  Surgeon: Copping Rogelia, MD;  Location: [facility];  Service: Endoscopy;  Laterality: N/A;  Diabetic - oral meds   ETHMOIDECTOMY Bilateral 04/17/2018   Procedure: ETHMOIDECTOMY;  Surgeon: Edda Mt, MD;  Location: [facility];  Service: ENT;  Laterality: Bilateral;   GALLBLADDER SURGERY     IMAGE GUIDED SINUS SURGERY N/A 04/17/2018   Procedure: IMAGE GUIDED SINUS SURGERY;  Surgeon: Edda Mt, MD;  Location: [facility];  Service: ENT;  Laterality: N/A;   MAXILLARY ANTROSTOMY Bilateral 04/17/2018   Procedure: MAXILLARY ANTROSTOMY;  Surgeon: Edda Mt, MD;  Location: [facility];  Service: ENT;  Laterality: Bilateral;   NASAL SEPTOPLASTY W/ TURBINOPLASTY Bilateral 04/17/2018   Procedure: NASAL SEPTOPLASTY WITH TURBINATE REDUCTION;  Surgeon: Edda Mt, MD;  Location: [facility];  Service: ENT;  Laterality: Bilateral;   TOTAL ABDOMINAL HYSTERECTOMY  2012   cervical dysplasia/ovaries remian   TRANSURETHRAL RESECTION OF BLADDER TUMOR N/A 04/17/2018   Procedure: NTRANSURETHRAL RESECTION OF BLADDER TUMOR (TURBT) WITH GEMCITABINE ;  Surgeon: Penne Knee, MD;  Location: [facility];  Service: Urology;  Laterality: N/A;    OB History     Gravida  1   Para  1   Term      Preterm      AB      Living         SAB      IAB      Ectopic      Multiple      Live Births               Home Medications    Prior to Admission medications   Medication Sig Start Date End Date Taking? Authorizing Provider  baclofen (LIORESAL) 10 MG tablet Take 1 tablet (10 mg total) by mouth 3 (three) times daily as needed for muscle spasms. 04/30/24  Yes Lori Huxley B, PA-C  promethazine -dextromethorphan (PROMETHAZINE -DM) 6.25-15 MG/5ML syrup Take 5 mLs by mouth 4 (four) times daily as needed. 04/30/24  Yes Lori Huxley NOVAK, PA-C  acetaminophen  (TYLENOL ) 500 MG tablet Take 500 mg by mouth every 6 (six) hours as needed.    [provider]  amLODipine  (NORVASC ) 5 MG tablet Take 1 tablet (5 mg total) by mouth daily. 01/24/24   Cannady, Jolene T, NP  Ascorbic Acid (VITAMIN C) 1000 MG tablet Take 1,000 mg by mouth daily.    [provider]  Cholecalciferol  5000 units capsule Take 1 capsule (5,000 Units total) by mouth daily. 11/28/15   Plonk, Elsie, MD  Dulaglutide  (TRULICITY ) 4.5 MG/0.5ML SOAJ Inject 4.5 mg as directed once a week. 01/24/24   Cannady, Jolene T, NP  EPINEPHrine  0.3 mg/0.3 mL IJ SOAJ injection Inject 0.3 mg into the muscle as needed for anaphylaxis. 11/17/21   Cannady, Jolene T, NP  glucose blood (CONTOUR NEXT TEST) test strip USE TO CHECK BS  UP TO 3 TIMES DAILY FOR DIABETES DX: E11.9 12/03/22   Cannady, Jolene T, NP  ibuprofen  (ADVIL ) 400 MG tablet Take 1-2 tablets (400-800 mg total) by mouth every 8 (eight) hours as needed. 10/24/23   Brimage, Vondra, DO  lamoTRIgine  (LAMICTAL ) 150 MG tablet Take 1 tablet (150 mg total) by mouth 2 (two) times daily. 01/24/24   Cannady, Jolene T, NP  levothyroxine  (SYNTHROID ) 150 MCG tablet Take 1 tablet (150 mcg total) by mouth daily. 01/24/24   Cannady, Jolene T, NP  losartan  (COZAAR ) 50 MG tablet TAKE 2 TABLETS BY MOUTH EVERY DAY 01/31/24   Cannady, Jolene T, NP  lubiprostone  (AMITIZA ) 24 MCG capsule TAKE 1 CAPSULE (24 MCG TOTAL) BY MOUTH 2 (TWO) TIMES DAILY WITH A MEAL. 04/02/24   Cannady, Jolene T, NP  metFORMIN  (GLUCOPHAGE ) 500 MG tablet Take 1 tablet (500 mg total) by mouth 2 (two) times daily. 01/24/24   Cannady, Jolene T, NP  methocarbamol  (ROBAXIN ) 500 MG tablet Take 1 tablet (500 mg total) by mouth 2 (two) times daily. 10/24/23   Brimage, Vondra, DO  mometasone (ELOCON) 0.1 % cream SMARTSIG:1 Application Topical 01/28/21   [provider]  nystatin  (MYCOSTATIN /NYSTOP ) powder Apply 1 application. topically 3 (three) times daily. 12/02/21   Cannady, Jolene T, NP  Omega-3 Fatty Acids (FISH OIL) 1200 MG CAPS Take 1,200 mg by mouth daily.     [provider]   omeprazole  (PRILOSEC) 40 MG capsule TAKE 1 CAPSULE BY MOUTH EVERY DAY 01/24/24   Cannady, Jolene T, NP  ondansetron  (ZOFRAN -ODT) 4 MG disintegrating tablet TAKE 1 TABLET BY MOUTH EVERY 8 HOURS AS NEEDED FOR NAUSEA AND VOMITING 09/26/23   Cannady, Jolene T, NP  rosuvastatin  (CRESTOR ) 40 MG tablet Take 1 tablet (40 mg total) by mouth daily. 01/24/24   Cannady, Jolene T, NP    Family History Family History  Problem Relation Age of Onset   Diabetes Maternal Grandmother    Hypertension Maternal Grandmother    Hyperlipidemia Maternal Grandmother    Hypothyroidism Maternal Grandmother    Heart disease Maternal Grandmother    Diabetes Mother    Hypertension Mother    Hypothyroidism Mother    Bipolar disorder Mother    Heart disease Mother    Kidney disease Mother    Thyroid  disease Mother    Depression Mother    Anxiety disorder Mother    Drug abuse Mother    Obesity Mother    Alcohol abuse Father    Depression Daughter    Breast cancer Neg Hx     Social History Social History   Tobacco Use   Smoking status: Former    Current packs/day: 0.00    Average packs/day: 1 pack/day for 10.0 years (10.0 ttl pk-yrs)    Types: Cigarettes    Start date: 07/12/1992    Quit date: 07/12/2002    Years since quitting: 21.8   Smokeless tobacco: Never   Tobacco comments:    quit 2004  Vaping Use   Vaping status: Never Used  Substance Use Topics   Alcohol use: Yes    Alcohol/week: 0.0 standard drinks of alcohol    Comment: rarely   Drug use: No     Allergies   Acyclovir and related, Hctz [hydrochlorothiazide ], Lisinopril, Latex, and Prednisone   Review of Systems Review of Systems  Constitutional:  Negative for chills, diaphoresis, fatigue and fever.  HENT:  Positive for sore throat. Negative for congestion, ear pain and rhinorrhea.   Respiratory:  Positive for  cough. Negative for shortness of breath.   Cardiovascular:  Negative for chest pain.  Gastrointestinal:  Negative for abdominal  pain, nausea and vomiting.  Genitourinary:  Negative for difficulty urinating, dysuria and frequency.  Musculoskeletal:  Positive for back pain. Negative for arthralgias and myalgias.  Skin:  Negative for rash.  Neurological:  Negative for weakness and headaches.  Hematological:  Negative for adenopathy.     Physical Exam Triage Vital Signs ED Triage Vitals  Encounter Vitals Group     BP      Girls Systolic BP Percentile      Girls Diastolic BP Percentile      Boys Systolic BP Percentile      Boys Diastolic BP Percentile      Pulse      Resp      Temp      Temp src      SpO2      Weight      Height      Head Circumference      Peak Flow      Pain Score      Pain Loc      Pain Education      Exclude from Growth Chart    No data found.  Updated Vital Signs BP (!) 136/91 (BP Location: Left Arm)   Pulse 79   Temp 98.6 F (37 C) (Oral)   Resp 16   SpO2 100%     Physical Exam Vitals and nursing note reviewed.  Constitutional:      General: She is not in acute distress.    Appearance: Normal appearance. She is not ill-appearing or toxic-appearing.  HENT:     Head: Normocephalic and atraumatic.     Nose: Nose normal.     Mouth/Throat:     Mouth: Mucous membranes are moist.     Pharynx: Oropharynx is clear. Posterior oropharyngeal erythema present.  Eyes:     General: No scleral icterus.       Right eye: No discharge.        Left eye: No discharge.     Conjunctiva/sclera: Conjunctivae normal.  Cardiovascular:     Rate and Rhythm: Normal rate and regular rhythm.     Heart sounds: Normal heart sounds.  Pulmonary:     Effort: Pulmonary effort is normal. No respiratory distress.     Breath sounds: Normal breath sounds.  Musculoskeletal:     Cervical back: Neck supple.     Lumbar back: Tenderness (generalized throughout lumbar region bilaterally) present. Normal range of motion.  Skin:    General: Skin is dry.  Neurological:     General: No focal deficit  present.     Mental Status: She is alert. Mental status is at baseline.     Motor: No weakness.     Gait: Gait normal.  Psychiatric:        Mood and Affect: Mood normal.        Behavior: Behavior normal.      UC Treatments / Results  Labs (all labs ordered are listed, but only abnormal results are displayed) Labs Reviewed  GROUP A STREP BY PCR  SARS CORONAVIRUS 2 BY RT PCR    EKG   Radiology No results found.  Procedures Procedures (including critical care time)  Medications Ordered in UC Medications - No data to display  Initial Impression / Assessment and Plan / UC Course  I have reviewed the triage vital signs and the nursing notes.  Pertinent labs & imaging results that were available during my care of the patient were reviewed by me and considered in my medical decision making (see chart for details).   53 year old female presents for right low back pain x 3 days.  No injury.  Also reports sore throat, cough and burning sensation in chest with sudden onset 2 days ago after she got into a newly detailed car.  Denies fever, fatigue, aches.  No sick contacts.  Strep and COVID test obtained.  Viral illness versus reactive airway disease/bronchospasm.  Advised patient I would call her with the results of her strep and COVID test.  Low back pain could be related to muscle spasm and strain.  Reports increased pain when she moves but not when she is still.  Sent baclofen.  Encouraged heat, ice, Tylenol , Motrin , muscle rubs.  Sent baclofen.  Reviewed ED precautions and handout.  Negative strep and COVID. No change to treatment plan.    Final Clinical Impressions(s) / UC Diagnoses   Final diagnoses:  Acute bronchospasm  Acute cough  Acute bilateral low back pain without sciatica  Sore throat     Discharge Instructions      -Will call if COVID or strep positive   BACK PAIN: Stressed avoiding painful activities . RICE (REST, ICE, COMPRESSION, ELEVATION)  guidelines reviewed. May alternate ice and heat. Consider use of muscle rubs, Salonpas patches, etc. Use medications as directed including muscle relaxers if prescribed. Take anti-inflammatory medications as prescribed or OTC NSAIDs/Tylenol .  F/u with PCP in 7-10 days for reexamination, and please feel free to call or return to the urgent care at any time for any questions or concerns you may have and we will be happy to help you!   BACK PAIN RED FLAGS: If the back pain acutely worsens or there are any red flag symptoms such as numbness/tingling, leg weakness, saddle anesthesia, or loss of bowel/bladder control, go immediately to the ER. Follow up with us  as scheduled or sooner if the pain does not begin to resolve or if it worsens before the follow up       ED Prescriptions     Medication Sig Dispense Auth. Provider   promethazine -dextromethorphan (PROMETHAZINE -DM) 6.25-15 MG/5ML syrup Take 5 mLs by mouth 4 (four) times daily as needed. 118 mL Lori Huxley B, PA-C   baclofen (LIORESAL) 10 MG tablet Take 1 tablet (10 mg total) by mouth 3 (three) times daily as needed for muscle spasms. 20 each Lori Knight      PDMP not reviewed this encounter.   Lori Huxley KATHEE, PA-C 04/30/24 1909

## 2024-04-30 NOTE — ED Triage Notes (Signed)
 Pt presents with some cough, sore throat and back pain x 2 days. Pt has not taken anything for her symptoms.

## 2024-04-30 NOTE — Discharge Instructions (Addendum)
-  Will call if COVID or strep positive   BACK PAIN: Stressed avoiding painful activities . RICE (REST, ICE, COMPRESSION, ELEVATION) guidelines reviewed. May alternate ice and heat. Consider use of muscle rubs, Salonpas patches, etc. Use medications as directed including muscle relaxers if prescribed. Take anti-inflammatory medications as prescribed or OTC NSAIDs/Tylenol .  F/u with PCP in 7-10 days for reexamination, and please feel free to call or return to the urgent care at any time for any questions or concerns you may have and we will be happy to help you!   BACK PAIN RED FLAGS: If the back pain acutely worsens or there are any red flag symptoms such as numbness/tingling, leg weakness, saddle anesthesia, or loss of bowel/bladder control, go immediately to the ER. Follow up with us  as scheduled or sooner if the pain does not begin to resolve or if it worsens before the follow up

## 2024-05-14 ENCOUNTER — Encounter: Payer: Self-pay | Admitting: Nurse Practitioner

## 2024-05-14 ENCOUNTER — Ambulatory Visit: Admitting: Nurse Practitioner

## 2024-05-15 DIAGNOSIS — F1211 Cannabis abuse, in remission: Secondary | ICD-10-CM | POA: Diagnosis not present

## 2024-05-15 DIAGNOSIS — F3181 Bipolar II disorder: Secondary | ICD-10-CM | POA: Diagnosis not present

## 2024-05-15 NOTE — Telephone Encounter (Signed)
 Called and spoke with patient. Emailed patient a link to [employer] with the insurance we currently accept.

## 2024-05-17 DIAGNOSIS — K5903 Drug induced constipation: Secondary | ICD-10-CM | POA: Diagnosis not present

## 2024-05-20 NOTE — Patient Instructions (Signed)

## 2024-05-23 ENCOUNTER — Ambulatory Visit: Admitting: Nurse Practitioner

## 2024-05-23 ENCOUNTER — Encounter: Payer: Self-pay | Admitting: Nurse Practitioner

## 2024-05-23 VITALS — BP 116/84 | HR 88 | Temp 98.5°F | Resp 18 | Ht 65.98 in | Wt 237.0 lb

## 2024-05-23 DIAGNOSIS — D329 Benign neoplasm of meninges, unspecified: Secondary | ICD-10-CM | POA: Diagnosis not present

## 2024-05-23 DIAGNOSIS — E1169 Type 2 diabetes mellitus with other specified complication: Secondary | ICD-10-CM

## 2024-05-23 DIAGNOSIS — E119 Type 2 diabetes mellitus without complications: Secondary | ICD-10-CM

## 2024-05-23 DIAGNOSIS — E785 Hyperlipidemia, unspecified: Secondary | ICD-10-CM

## 2024-05-23 DIAGNOSIS — I152 Hypertension secondary to endocrine disorders: Secondary | ICD-10-CM

## 2024-05-23 DIAGNOSIS — F309 Manic episode, unspecified: Secondary | ICD-10-CM | POA: Diagnosis not present

## 2024-05-23 DIAGNOSIS — E1159 Type 2 diabetes mellitus with other circulatory complications: Secondary | ICD-10-CM

## 2024-05-23 DIAGNOSIS — E66812 Obesity, class 2: Secondary | ICD-10-CM

## 2024-05-23 DIAGNOSIS — E1129 Type 2 diabetes mellitus with other diabetic kidney complication: Secondary | ICD-10-CM

## 2024-05-23 DIAGNOSIS — Z7985 Long-term (current) use of injectable non-insulin antidiabetic drugs: Secondary | ICD-10-CM | POA: Diagnosis not present

## 2024-05-23 DIAGNOSIS — G4733 Obstructive sleep apnea (adult) (pediatric): Secondary | ICD-10-CM

## 2024-05-23 DIAGNOSIS — R809 Proteinuria, unspecified: Secondary | ICD-10-CM | POA: Diagnosis not present

## 2024-05-23 MED ORDER — LAMOTRIGINE 150 MG PO TABS: 150.0000 mg | ORAL_TABLET | Freq: Every day | ORAL | Status: AC

## 2024-05-23 NOTE — Assessment & Plan Note (Signed)
 Chronic, ongoing with A1c 6.1% today and urine ALB 80 (February 2025).  She lost a total of 38 pounds while on Mounjaro .  Continue Losartan  for kidney protection.  Tolerating Trulicity  only, unable to get Mounjaro  anymore due to coverage.  Will continue Metformin  as ordered.  Educated on BS goals in morning and 2 hours after meals.   - On Statin and ARB - Eye and Foot exams up to date - Vaccinations up to date with exception of Shingrix and Td which she wishes to think about - Return in 3 months.

## 2024-05-23 NOTE — Assessment & Plan Note (Signed)
Followed by neurology, continue collaboration with neurology team and recent notes reviewed. °

## 2024-05-23 NOTE — Assessment & Plan Note (Signed)
Chronic, ongoing.  Continue on statin which is offering benefit.  Lipid panel today.

## 2024-05-23 NOTE — Progress Notes (Addendum)
 BP 116/84 (BP Location: Left Arm, Patient Position: Sitting, Cuff Size: Large)   Pulse 88   Temp 98.5 F (36.9 C) (Oral)   Resp 18   Ht 5' 5.98 (1.676 m)   Wt 237 lb (107.5 kg)   SpO2 96%   BMI 38.27 kg/m    Subjective:    Patient ID: Lori Knight, female    DOB: [birth year], 53 y.o.   MRN: [id number]  HPI: Lori Knight is a 53 y.o. female  Chief Complaint  Patient presents with   Diabetes    Not been good about checking her sugars at home.    Hypertension    Also not checking at home but not aware of problems.    Mood    Feeling much better with recent dose change and says its a joy now to not be angry.    DIABETES July A1c 6.4%. Continues Metformin  500 MG BID and Trulicity  4.5 MG weekly. No longer taking Mounjaro  due to cost and not covered -- did well with this and had reduced appetite/weight loss, currently with Trulicity  is not noticing as much decreased appetite. Is gaining weight at this time due to being off Mounjaro . Hypoglycemic episodes:no Polydipsia/polyuria: no Visual disturbance: no Chest pain: no Paresthesias: no Glucose Monitoring: no             Accucheck frequency: not checking             Fasting glucose:              Post prandial:              Evening:             Before meals: Taking Insulin ?: no             Long acting insulin :             Short acting insulin : Blood Pressure Monitoring: not checking Retinal Examination: Up to Date -- [facility] Foot Exam: Up to Date Pneumovax: Up to Date Influenza: Up to Date Aspirin: no   HYPERTENSION / HYPERLIPIDEMIA Continues Losartan  100 MG, Amlodipine  5 MG and Rosuvastatin  40 MG QHS. Echo 2019 noted EF 50-55%. Follows with neurosurgery, Dr. Bluford, for meningioma. No changes made, imaging 09/14/22. This remains stable -- to repeat in 3 years.  Duration of hypertension: chronic BP monitoring frequency: not checking BP range:  BP medication side effects: no Duration of hyperlipidemia:  chronic Aspirin: no Recent stressors: no Recurrent headaches: no Visual changes: no Palpitations: no Dyspnea: recently due to a strong scent Chest pain: no Lower extremity edema: no Dizzy/lightheaded: no The ASCVD Risk score (Arnett DK, et al., 2019) failed to calculate for the following reasons:   Risk score cannot be calculated because patient has a medical history suggesting prior/existing ASCVD   HYPOTHYROIDISM Takes Levothyroxine  150 MCG daily.   Thyroid  control status:stable Satisfied with current treatment? yes Medication side effects: no Medication compliance: good compliance Etiology of hypothyroidism:  Recent dose adjustment: yes Fatigue: no Cold intolerance: no Heat intolerance: no  Weight gain: no Weight loss: no Constipation: no Diarrhea/loose stools: no Palpitations: no Lower extremity edema: no Anxiety/depressed mood: no   BIPOLAR DISORDER Saw psychiatry recently, Dr. Chipper, and medication changes were made to Lamictal  which she reports much benefit from, feeling less angry. Psychiatry needs labs performed, she brought form to office today on what is needed. Therapy last seen 04/13/24. Mood status: stable Satisfied with current treatment?: yes Symptom severity: moderate  Duration of current treatment : chronic Side effects: no Medication compliance: good compliance Psychotherapy/counseling: at present -- with [facility] Depressed mood: no Anxious mood: occasional due to life changes Anhedonia: no Significant weight loss or gain: no Insomnia: no Fatigue: yes Feelings of worthlessness or guilt: no Impaired concentration/indecisiveness: yes Suicidal ideations: no Hopelessness: no Crying spells: no    05/23/2024    4:15 PM 03/16/2024   11:15 AM 01/24/2024    3:30 PM 10/31/2023   11:28 AM 08/29/2023    9:24 AM  Depression screen PHQ 2/9  Decreased Interest 0 1 1 0 0  Down, Depressed, Hopeless 0 1 0 0 0  PHQ - 2 Score 0 2 1 0 0  Altered sleeping 1 0 1 2 2    Tired, decreased energy 1 2 2 2 2   Change in appetite 3 2 2 2 2   Feeling bad or failure about yourself  0 0 0 0 0  Trouble concentrating 1 2 1 1 2   Moving slowly or fidgety/restless 0 0 0 0 0  Suicidal thoughts 0 0 0 0 0  PHQ-9 Score 6 8  7  7  8    Difficult doing work/chores Not difficult at all  Not difficult at all Not difficult at all Not difficult at all     Data saved with a previous flowsheet row definition       05/23/2024    4:16 PM 03/16/2024   11:15 AM 01/24/2024    3:30 PM 10/31/2023   11:28 AM  GAD 7 : Generalized Anxiety Score  Nervous, Anxious, on Edge 2 1 2 1   Control/stop worrying 1 1 1 1   Worry too much - different things 1 1 1 1   Trouble relaxing 1 1 1 1   Restless 1 1 1 1   Easily annoyed or irritable 1 1 2 2   Afraid - awful might happen 0 0 2 1  Total GAD 7 Score 7 6 10 8   Anxiety Difficulty Not difficult at all  Not difficult at all Not difficult at all   Relevant past medical, surgical, family and social history reviewed and updated as indicated. Interim medical history since our last visit reviewed. Allergies and medications reviewed and updated.  Review of Systems  Constitutional:  Negative for activity change, appetite change, diaphoresis, fatigue and fever.  Respiratory:  Negative for cough, chest tightness, shortness of breath and wheezing.   Cardiovascular:  Negative for chest pain, palpitations and leg swelling.  Gastrointestinal: Negative.   Endocrine: Negative for cold intolerance, heat intolerance, polydipsia, polyphagia and polyuria.  Musculoskeletal:  Positive for arthralgias.  Neurological: Negative.   Psychiatric/Behavioral: Negative.     Per HPI unless specifically indicated above     Objective:    BP 116/84 (BP Location: Left Arm, Patient Position: Sitting, Cuff Size: Large)   Pulse 88   Temp 98.5 F (36.9 C) (Oral)   Resp 18   Ht 5' 5.98 (1.676 m)   Wt 237 lb (107.5 kg)   SpO2 96%   BMI 38.27 kg/m   Wt Readings from Last 3  Encounters:  05/23/24 237 lb (107.5 kg)  03/16/24 233 lb (105.7 kg)  01/24/24 233 lb (105.7 kg)    Physical Exam Vitals and nursing note reviewed.  Constitutional:      General: She is awake. She is not in acute distress.    Appearance: She is well-developed and well-groomed. She is obese. She is not ill-appearing or toxic-appearing.  HENT:     Head: Normocephalic.  Right Ear: Hearing and external ear normal.     Left Ear: Hearing and external ear normal.  Eyes:     General: Lids are normal.        Right eye: No discharge.        Left eye: No discharge.     Conjunctiva/sclera: Conjunctivae normal.     Pupils: Pupils are equal, round, and reactive to light.  Neck:     Thyroid : No thyromegaly.     Vascular: No carotid bruit.  Cardiovascular:     Rate and Rhythm: Normal rate and regular rhythm.     Heart sounds: Normal heart sounds. No murmur heard.    No gallop.  Pulmonary:     Effort: Pulmonary effort is normal. No accessory muscle usage or respiratory distress.     Breath sounds: Normal breath sounds.  Abdominal:     General: Bowel sounds are normal. There is no distension.     Palpations: Abdomen is soft.     Tenderness: There is no abdominal tenderness.  Musculoskeletal:     Cervical back: Normal range of motion and neck supple.     Right lower leg: No edema.     Left lower leg: No edema.  Lymphadenopathy:     Cervical: No cervical adenopathy.  Skin:    General: Skin is warm and dry.  Neurological:     Mental Status: She is alert and oriented to person, place, and time.     Deep Tendon Reflexes: Reflexes are normal and symmetric.     Reflex Scores:      Brachioradialis reflexes are 2+ on the right side and 2+ on the left side.      Patellar reflexes are 2+ on the right side and 2+ on the left side. Psychiatric:        Attention and Perception: Attention normal.        Mood and Affect: Mood normal.        Speech: Speech normal.        Behavior: Behavior normal.  Behavior is cooperative.        Thought Content: Thought content normal.    Diabetic Foot Exam - Simple   Simple Foot Form Visual Inspection No deformities, no ulcerations, no other skin breakdown bilaterally: Yes Sensation Testing Intact to touch and monofilament testing bilaterally: Yes Pulse Check Posterior Tibialis and Dorsalis pulse intact bilaterally: Yes Comments     Results for orders placed or performed during the hospital encounter of 04/30/24  Group A Strep by PCR   Collection Time: 04/30/24  6:01 PM   Specimen: Throat; Sterile Swab  Result Value Ref Range   Group A Strep by PCR NOT DETECTED NOT DETECTED  SARS Coronavirus 2 by RT PCR (hospital order, performed in [facility] hospital lab) *cepheid single result test* Anterior Nasal Swab   Collection Time: 04/30/24  6:01 PM   Specimen: Anterior Nasal Swab  Result Value Ref Range   SARS Coronavirus 2 by RT PCR NEGATIVE NEGATIVE      Assessment & Plan:   Problem List Items Addressed This Visit       Cardiovascular and Mediastinum   Hypertension associated with diabetes (HCC)   Chronic, ongoing. BP at goal today.  Continue current medication regimen and adjust as needed.  Focus on DASH diet and regular exercise at home.  Recommend she check BP at few days a week at home and document for provider.  Losartan  for kidney protection, urine ALB 80 February  2025.  LABS: CMP.        Relevant Orders   Bayer DCA Hb A1c Waived     Endocrine   Type 2 diabetes mellitus with proteinuria (HCC) - Primary   Chronic, ongoing with A1c 6.1% today and urine ALB 80 (February 2025).  She lost a total of 38 pounds while on Mounjaro .  Continue Losartan  for kidney protection.  Tolerating Trulicity  only, unable to get Mounjaro  anymore due to coverage.  Will continue Metformin  as ordered.  Educated on BS goals in morning and 2 hours after meals.   - On Statin and ARB - Eye and Foot exams up to date - Vaccinations up to date with exception of  Shingrix and Td which she wishes to think about - Return in 3 months.      Relevant Orders   Bayer DCA Hb A1c Waived   Type 2 diabetes mellitus with morbid obesity (HCC)   Chronic, ongoing with A1c 6.1% today and urine ALB 80 (February 2025).  She lost a total of 38 pounds while on Mounjaro .  Continue Losartan  for kidney protection.  Tolerating Trulicity  only, unable to get Mounjaro  anymore due to coverage.  Will continue Metformin  as ordered.  Educated on BS goals in morning and 2 hours after meals.   - On Statin and ARB - Eye and Foot exams up to date - Vaccinations up to date with exception of Shingrix and Td which she wishes to think about - Return in 3 months.      Relevant Orders   Bayer DCA Hb A1c Waived   Hyperlipidemia associated with type 2 diabetes mellitus (HCC)   Chronic, ongoing.  Continue on statin which is offering benefit.  Lipid panel today.       Relevant Orders   Bayer DCA Hb A1c Waived   Comprehensive metabolic panel with GFR   Lipid Panel w/o Chol/HDL Ratio   Diabetes mellitus treated with injections of non-insulin  medication (HCC)   Refer to diabetes with obesity plan of care.      Relevant Orders   Bayer DCA Hb A1c Waived     Nervous and Auditory   Meningioma [facility])   Followed by neurology, continue collaboration with neurology team and recent notes reviewed.        Other   Obesity   BMI BMI 8.27 with T2DM, HTN/HLD, did lose weight with Mounjaro  and is maintaining this at present with Trulicity .  Recommended eating smaller high protein, low fat meals more frequently and exercising 30 mins a day 5 times a week with a goal of 10-15lb weight loss in the next 3 months. Patient voiced their understanding and motivation to adhere to these recommendations.        Bipolar I disorder, single manic episode (HCC)   Chronic, stable.  Denies SI/HI.  Followed by psychology and psychiatry, appreciate their input. Recent notes reviewed. Will obtain B12, Folate, and  UDS today for psychiatry.      Relevant Orders   Folate   Vitamin B12   [id number] 11+Oxyco+Alc+Crt-Bund     Follow up plan: Return in about 3 months (around 08/23/2024) for T2DM, HTN/HLD, Bipolar Disorder.

## 2024-05-23 NOTE — Assessment & Plan Note (Signed)
 BMI BMI 8.27 with T2DM, HTN/HLD, did lose weight with Mounjaro  and is maintaining this at present with Trulicity .  Recommended eating smaller high protein, low fat meals more frequently and exercising 30 mins a day 5 times a week with a goal of 10-15lb weight loss in the next 3 months. Patient voiced their understanding and motivation to adhere to these recommendations.

## 2024-05-23 NOTE — Assessment & Plan Note (Signed)
 Chronic, stable.  Denies SI/HI.  Followed by psychology and psychiatry, appreciate their input. Recent notes reviewed. Will obtain B12, Folate, and UDS today for psychiatry.

## 2024-05-23 NOTE — Assessment & Plan Note (Signed)
 Chronic, ongoing. BP at goal today.  Continue current medication regimen and adjust as needed.  Focus on DASH diet and regular exercise at home.  Recommend she check BP at few days a week at home and document for provider.  Losartan  for kidney protection, urine ALB 80 February 2025.  LABS: CMP.

## 2024-05-23 NOTE — Assessment & Plan Note (Signed)
 Refer to diabetes with obesity plan of care.

## 2024-05-24 ENCOUNTER — Ambulatory Visit: Payer: Self-pay | Admitting: Nurse Practitioner

## 2024-05-24 LAB — DRUG SCREEN 764883 11+OXYCO+ALC+CRT-BUND
Amphetamines, Urine: NEGATIVE ng/mL
BENZODIAZ UR QL: NEGATIVE ng/mL
Barbiturate screen, urine: NEGATIVE ng/mL
Cannabinoid Quant, Ur: NEGATIVE ng/mL
Cocaine (Metab.): NEGATIVE ng/mL
Creatinine, Urine: 27.4 mg/dL (ref 20.0–300.0)
Ethanol, Urine: NEGATIVE %
Meperidine: NEGATIVE ng/mL
Methadone Screen, Urine: NEGATIVE ng/mL
Nitrite Urine, Quantitative: NEGATIVE ug/mL
OPIATE SCREEN URINE: NEGATIVE ng/mL
Oxycodone/Oxymorphone, Urine: NEGATIVE ng/mL
PCP Quant, Ur: NEGATIVE ng/mL
Propoxyphene: NEGATIVE ng/mL
Tramadol: NEGATIVE ng/mL
pH, Urine: 5.5 (ref 4.5–8.9)

## 2024-05-24 LAB — COMPREHENSIVE METABOLIC PANEL WITH GFR
ALT: 23 IU/L (ref 0–32)
AST: 18 IU/L (ref 0–40)
Albumin: 4.2 g/dL (ref 3.8–4.9)
Alkaline Phosphatase: 113 IU/L (ref 49–135)
BUN/Creatinine Ratio: 11 (ref 9–23)
BUN: 8 mg/dL (ref 6–24)
Bilirubin Total: <0.2 mg/dL (ref 0.0–1.2)
CO2: 22 mmol/L (ref 20–29)
Calcium: 9.5 mg/dL (ref 8.7–10.2)
Chloride: 103 mmol/L (ref 96–106)
Creatinine, Ser: 0.75 mg/dL (ref 0.57–1.00)
Globulin, Total: 2.3 g/dL (ref 1.5–4.5)
Glucose: 106 mg/dL — ABNORMAL HIGH (ref 70–99)
Potassium: 3.9 mmol/L (ref 3.5–5.2)
Sodium: 140 mmol/L (ref 134–144)
Total Protein: 6.5 g/dL (ref 6.0–8.5)
eGFR: 95 mL/min/1.73 (ref >59)

## 2024-05-24 LAB — LIPID PANEL W/O CHOL/HDL RATIO
Cholesterol, Total: 219 mg/dL — ABNORMAL HIGH (ref 100–199)
HDL: 45 mg/dL (ref >39)
LDL Chol Calc (NIH): 130 mg/dL — ABNORMAL HIGH (ref 0–99)
Triglycerides: 249 mg/dL — ABNORMAL HIGH (ref 0–149)
VLDL Cholesterol Cal: 44 mg/dL — ABNORMAL HIGH (ref 5–40)

## 2024-05-24 LAB — BAYER DCA HB A1C WAIVED: HB A1C (BAYER DCA - WAIVED): 6.1 % — ABNORMAL HIGH (ref 4.8–5.6)

## 2024-05-24 LAB — FOLATE: Folate: >20 ng/mL (ref >3.0)

## 2024-05-24 LAB — VITAMIN B12: Vitamin B-12: 1115 pg/mL (ref 232–1245)

## 2024-05-24 NOTE — Progress Notes (Signed)
 Contacted via MyChart  Good afternoon Lori Knight, your labs have returned: - Kidney function, creatinine and eGFR, remains normal, as is liver function, AST and ALT.  - Folate and B12 levels normal. - Lipid panel is showing elevations. Are you taking Rosuvastatin  daily? Please let me know as this is important for stroke prevention and heart protection.  I will message Dr. Chipper with current labs needed. Any questions? Keep being stellar!!  Thank you for allowing me to participate in your care.  I appreciate you. Kindest regards, Myalynn Lingle

## 2024-05-28 DIAGNOSIS — F1211 Cannabis abuse, in remission: Secondary | ICD-10-CM | POA: Diagnosis not present

## 2024-05-28 DIAGNOSIS — F3181 Bipolar II disorder: Secondary | ICD-10-CM | POA: Diagnosis not present

## 2024-06-26 DIAGNOSIS — F1211 Cannabis abuse, in remission: Secondary | ICD-10-CM | POA: Diagnosis not present

## 2024-06-26 DIAGNOSIS — F3181 Bipolar II disorder: Secondary | ICD-10-CM | POA: Diagnosis not present

## 2024-06-30 ENCOUNTER — Telehealth

## 2024-06-30 DIAGNOSIS — H539 Unspecified visual disturbance: Secondary | ICD-10-CM

## 2024-06-30 NOTE — Progress Notes (Signed)
 " Virtual Visit Consent   Lori Knight, you are scheduled for a virtual visit with a [facility] provider today. Just as with appointments in the office, your consent must be obtained to participate. Your consent will be active for this visit and any virtual visit you may have with one of our providers in the next 365 days. If you have a MyChart account, a copy of this consent can be sent to you electronically.  As this is a virtual visit, video technology does not allow for your provider to perform a traditional examination. This may limit your provider's ability to fully assess your condition. If your provider identifies any concerns that need to be evaluated in person or the need to arrange testing (such as labs, EKG, etc.), we will make arrangements to do so. Although advances in technology are sophisticated, we cannot ensure that it will always work on either your end or our end. If the connection with a video visit is poor, the visit may have to be switched to a telephone visit. With either a video or telephone visit, we are not always able to ensure that we have a secure connection.  By engaging in this virtual visit, you consent to the provision of healthcare and authorize for your insurance to be billed (if applicable) for the services provided during this visit. Depending on your insurance coverage, you may receive a charge related to this service.  I need to obtain your verbal consent now. Are you willing to proceed with your visit today? Sueann Goga has provided verbal consent on 06/30/2024 for a virtual visit (video or telephone). Loa Lamp, FNP  Date: 06/30/2024 11:37 AM   Virtual Visit via Video Note   I, Loa Lamp, connected with  [name]  ([id number], 1971/06/18) on 06/30/2024 at 11:30 AM EST by a video-enabled telemedicine application and verified that I am speaking with the correct person using two identifiers.  Location: Patient: Virtual Visit Location Patient:  Home Provider: Virtual Visit Location Provider: Home Office   I discussed the limitations of evaluation and management by telemedicine and the availability of in person appointments. The patient expressed understanding and agreed to proceed.    History of Present Illness: Lori Knight is a 53 y.o. who identifies as a female who was assigned female at birth, and is being seen today for floaters and dark spots in left eye now. Same sx in rt eye last year. She could not get apptmt at urgent care. SABRA  HPI: HPI  Problems:  Patient Active Problem List   Diagnosis Date Noted   Acute pain of left shoulder 01/24/2024   Diabetes mellitus treated with injections of non-insulin  medication (HCC) 08/27/2023   Constipation 07/11/2023   Right shoulder pain 12/02/2022   Macromastia 03/04/2022   Chronic midline low back pain without sciatica 01/15/2022   Hemorrhoids, external 12/02/2021   Aortic atherosclerosis 09/30/2020   Type 2 diabetes mellitus with proteinuria (HCC) 02/20/2020   Meningioma (HCC) 12/24/2019   Hyperlipidemia associated with type 2 diabetes mellitus (HCC) 08/13/2019   Gastroesophageal reflux disease without esophagitis 06/20/2019   Type 2 diabetes mellitus with morbid obesity (HCC) 12/01/2015   OSA on CPAP 09/22/2015   Internal hemorrhoids 08/28/2015   Vitamin D  deficiency 08/28/2015   Obesity 08/27/2015   Hypertension associated with diabetes (HCC) 02/14/2014   Benign paroxysmal positional nystagmus 02/14/2014   Angiomyolipoma of both kidneys 08/14/2012   Bipolar I disorder, single manic episode (HCC) 08/14/2012   Hashimoto's thyroiditis 06/21/2012  Bilateral polycystic ovarian syndrome 06/21/2012    Allergies: Allergies[1] Medications: Current Medications[2]  Observations/Objective: Patient is well-developed, well-nourished in no acute distress.  Resting comfortably  at home.  Head is normocephalic, atraumatic.  No labored breathing.  Speech is clear and coherent  with logical content.  Patient is alert and oriented at baseline.    Assessment and Plan: 1. Vision changes (Primary)  Call ophthalmology for on call or go to ED.   Follow Up Instructions: I discussed the assessment and treatment plan with the patient. The patient was provided an opportunity to ask questions and all were answered. The patient agreed with the plan and demonstrated an understanding of the instructions.  A copy of instructions were sent to the patient via MyChart unless otherwise noted below.     The patient was advised to call back or seek an in-person evaluation if the symptoms worsen or if the condition fails to improve as anticipated.    Tali Cleaves, FNP     [1]  Allergies Allergen Reactions   Acyclovir And Related    Hctz [Hydrochlorothiazide ] Cough   Lisinopril Cough   Latex Rash    Condoms only   Prednisone Anxiety    Paranoia  [2]  Current Outpatient Medications:    acetaminophen  (TYLENOL ) 500 MG tablet, Take 500 mg by mouth every 6 (six) hours as needed., Disp: , Rfl:    albuterol  (VENTOLIN  HFA) 108 (90 Base) MCG/ACT inhaler, Inhale 1-2 puffs into the lungs every 6 (six) hours as needed for wheezing or shortness of breath., Disp: 1 each, Rfl: 0   amLODipine  (NORVASC ) 5 MG tablet, Take 1 tablet (5 mg total) by mouth daily., Disp: 30 tablet, Rfl: 12   Ascorbic Acid (VITAMIN C) 1000 MG tablet, Take 1,000 mg by mouth daily., Disp: , Rfl:    baclofen  (LIORESAL ) 10 MG tablet, Take 1 tablet (10 mg total) by mouth 3 (three) times daily as needed for muscle spasms., Disp: 20 each, Rfl: 0   Cholecalciferol  5000 units capsule, Take 1 capsule (5,000 Units total) by mouth daily., Disp: , Rfl:    Dulaglutide  (TRULICITY ) 4.5 MG/0.5ML SOAJ, Inject 4.5 mg as directed once a week., Disp: 6 mL, Rfl: 4   EPINEPHrine  0.3 mg/0.3 mL IJ SOAJ injection, Inject 0.3 mg into the muscle as needed for anaphylaxis., Disp: 1 each, Rfl: 4   glucose blood (CONTOUR NEXT TEST) test strip,  USE TO CHECK BS UP TO 3 TIMES DAILY FOR DIABETES DX: E11.9, Disp: 100 strip, Rfl: 12   ibuprofen  (ADVIL ) 400 MG tablet, Take 1-2 tablets (400-800 mg total) by mouth every 8 (eight) hours as needed., Disp: 30 tablet, Rfl: 0   lamoTRIgine  (LAMICTAL ) 150 MG tablet, Take 1 tablet (150 mg total) by mouth at bedtime., Disp: , Rfl:    lamoTRIgine  (LAMICTAL ) 200 MG tablet, Take 200 mg by mouth every morning., Disp: , Rfl:    levothyroxine  (SYNTHROID ) 150 MCG tablet, Take 1 tablet (150 mcg total) by mouth daily., Disp: 90 tablet, Rfl: 2   losartan  (COZAAR ) 50 MG tablet, TAKE 2 TABLETS BY MOUTH EVERY DAY, Disp: 60 tablet, Rfl: 5   metFORMIN  (GLUCOPHAGE ) 500 MG tablet, Take 1 tablet (500 mg total) by mouth 2 (two) times daily., Disp: 60 tablet, Rfl: 5   methocarbamol  (ROBAXIN ) 500 MG tablet, Take 1 tablet (500 mg total) by mouth 2 (two) times daily., Disp: 20 tablet, Rfl: 0   mometasone (ELOCON) 0.1 % cream, SMARTSIG:1 Application Topical, Disp: , Rfl:  nystatin  (MYCOSTATIN /NYSTOP ) powder, Apply 1 application. topically 3 (three) times daily., Disp: 15 g, Rfl: 4   Omega-3 Fatty Acids (FISH OIL) 1200 MG CAPS, Take 1,200 mg by mouth daily. , Disp: , Rfl:    omeprazole  (PRILOSEC) 40 MG capsule, TAKE 1 CAPSULE BY MOUTH EVERY DAY, Disp: 60 capsule, Rfl: 5   ondansetron  (ZOFRAN -ODT) 4 MG disintegrating tablet, TAKE 1 TABLET BY MOUTH EVERY 8 HOURS AS NEEDED FOR NAUSEA AND VOMITING, Disp: 40 tablet, Rfl: 1   promethazine -dextromethorphan (PROMETHAZINE -DM) 6.25-15 MG/5ML syrup, Take 5 mLs by mouth 4 (four) times daily as needed., Disp: 118 mL, Rfl: 0   rosuvastatin  (CRESTOR ) 40 MG tablet, Take 1 tablet (40 mg total) by mouth daily., Disp: 90 tablet, Rfl: 3  "

## 2024-06-30 NOTE — Patient Instructions (Signed)
 Eye Floaters  Eye floaters are spots in your vision caused by shadows from specks of material that float inside your eye. Floaters may be more obvious when you look up at the sky or at a bright, blank background.  Floaters do not usually cause vision problems, but it is still important to get an eye exam to make sure that they are not a sign of a more serious condition.  What are the causes?  In most cases, this condition is caused by age-related changes in the eye. As you age, the jelly-like fluid (vitreous) inside the eyeball shrinks and can become stringy. The stringy strands of vitreous cast shadows on the back of the eye (retina). These shadows show up as floaters in your vision.  Other possible causes of eye floaters include:  A torn retina.  Bleeding inside the eye. Diabetes and other conditions can cause blood vessels in the retina to bleed.  A blood clot in the major vein of the retina or its branches (retinal vein occlusion).  Separation (detachment) of the:  Vitreous.  Retina.  Eye inflammation (uveitis).  Eye infection.  What increases the risk?  You are more likely to develop this condition if:  You are older. The risk increases with age.  You have nearsightedness.  You have diabetes.  You have had cataracts removed, or other eye surgery.  You have or have had an injury or trauma to the eye.  What are the signs or symptoms?      The main symptom of this condition is the appearance of small, shadowy shapes moving across your vision. These shapes are called floaters. The shapes move as your eyes move. They often drift out of your central vision when you keep your eyes still. These shapes may look like:  Specks.  Dots.  Circles.  Squiggly lines.  Thread.  Sometimes floaters appear along with flashes. Flashes usually occur at the edge of your vision. They may look like:  Bursts of light.  Flashing lights.  Lightning streaks.  What is commonly referred to as "stars."  In some cases, seeing flashes can be a  sign of a torn or detached retina. This is a serious condition that requires emergency evaluation.  How is this diagnosed?  This condition may be diagnosed based on:  Your symptoms and medical history.  An exam by a health care provider who specializes in conditions and diseases of the eye (ophthalmologist). The exam may include:  Putting eye drops in your eye to make the pupil wider (dilated). The pupil is the opening in the center of the eye.  Checking the back of your eye with a microscope. This exam is the best way to determine whether your floaters are a normal part of aging or a warning sign of a more serious eye problem.  Imaging tests such as an ultrasound. This may be needed if the specialist is unable to see well enough with the microscopes.  How is this treated?  Treatment for this condition may depend on the cause of the condition.  If your floaters are the result of aging, you do not need treatment unless they start to affect your vision. Floaters often improve but may not go away completely. Most people adapt to the floaters or, over time, the floaters settle below the line of sight.  If a retinal tear or detachment is causing your floaters, you may need surgery.  If you have an underlying condition that is causing floaters, such as  an infection, the floaters should go away when that condition is treated.  In rare cases, if the floaters are affecting your vision, surgery to remove the vitreous and replace it with a saltwater solution (vitrectomy) may be considered.  Follow these instructions at home:  Take over-the-counter and prescription medicines only as told by your health care provider.  Do not drive if you have trouble seeing. Ask your health care provider for guidance about when it is safe for you to drive.  Keep all follow-up visits. This is important.  Contact a health care provider if:  You cannot see well because of your floaters.  You develop any new symptoms.  Get help right away if:  You  develop a sudden increase in the number of floaters you see.  You develop flashes along with floaters.  It looks as if a curtain is blocking part of your vision.  Your vision suddenly changes, or you lose your vision completely.  These symptoms may be an emergency. Get help right away. Call 911.  Do not wait to see if the symptoms will go away.  Do not drive yourself to the hospital.  Summary  Eye floaters are spots in your vision caused by specks of material that float around inside your eye.  In most cases, eye floaters are caused by age-related changes in the eye.  Most people do not need treatment for eye floaters unless there is another condition causing the floaters, such as a retinal tear or detachment or an infection.  If you develop a sudden increase in the number of floaters, see flashes, or notice a curtain blocking part of your vision, you should see an eye care provider right away.  This information is not intended to replace advice given to you by your health care provider. Make sure you discuss any questions you have with your health care provider.  Document Revised: 01/14/2021 Document Reviewed: 01/14/2021  Elsevier Patient Education  2024 [employer].

## 2024-07-01 ENCOUNTER — Other Ambulatory Visit: Payer: Self-pay | Admitting: Nurse Practitioner

## 2024-07-01 NOTE — Patient Instructions (Signed)
 Chronic Knee Pain, Adult Knee pain that lasts longer than 3 months is called chronic knee pain. You may have pain in one or both knees. Symptoms of chronic knee pain may also include swelling and stiffness. Many conditions can cause chronic knee pain. The most common cause is wear and tear of your knee joint as you get older. Other possible causes include: A disease that causes inflammation of the knee, such as rheumatoid arthritis. This usually affects both knees. A condition called inflammatory arthritis, such as gout. An injury to the knee that causes arthritis. An injury to the knee that damages the ligaments. Ligaments are tissues that connect bones to each other. Runner's knee or pain behind the kneecap. Treatment for chronic knee pain depends on the cause. The main treatments for chronic knee pain are: Doing exercises to help your knee move better and get stronger, called physical therapy. Losing weight if you are overweight. This condition may also be treated with medicines, injections, a knee sleeve or brace, and by using crutches. You health care provider may also recommend rest, ice, pressure (compression), and elevation, also called RICE therapy. Follow these instructions at home: If you have a knee sleeve or brace that can be taken off:  Wear the knee sleeve or brace as told by your provider. Take it off only if your provider says that you can. Check the skin around it every day. Tell your provider if you see problems. Loosen the knee sleeve or brace if your toes tingle, are numb, or turn cold and blue. Keep the knee sleeve or brace clean and dry. Bathing If the knee sleeve or brace is not waterproof: Do not let it get wet. Cover it when you take a bath or shower. Use a cover that does not let any water in. Managing pain, stiffness, and swelling     If told, put heat on the area. Do this as often as told. Use the heat source that your provider recommends, such as a moist  heat pack or a heating pad. If you have a knee sleeve or brace that you can take off, remove it as told. Place a towel between your skin and the heat source. Leave the heat on for 20-30 minutes. If told, put ice on the area. If you have a knee sleeve or brace that you can take off, remove it as told. Put ice in a plastic bag. Place a towel between your skin and the bag. Leave the ice on for 20 minutes, 2-3 times a day. If your skin turns bright red, remove the ice or heat right away to prevent skin damage. The risk of damage is higher if you cannot feel pain, heat, or cold. Move your toes often to reduce stiffness and swelling. Raise the injured area above the level of your heart while you are sitting or lying down. Use a pillow to support your foot as needed. Activity Avoid activities where both feet leave the ground at the same time. Avoid running, jumping rope, and doing jumping jacks. Follow the exercise plan that your provider made for you. Your provider may suggest that you: Avoid activities that make knee pain worse. This may mean that you need to change your exercise routines, sports, or job duties. Wear shoes with cushioned soles. Avoid sports that require running and sudden changes in direction. Do physical therapy. Physical therapy helps your knee move better and get stronger. Exercise as told. Do exercises that increase balance and strength, such as  tai chi and yoga. Do not stand or walk on your injured knee until you're told it's okay. Use crutches as told. Return to normal activities when you're told. Ask what things are safe for you to do. General instructions Take your medicines only as told by your provider. If you are overweight, work with your provider and an expert in healthy eating called a dietitian to set goals to lose weight. Losing even a little weight can reduce knee pain. Being overweight can make your knee hurt more. Do not smoke, vape, or use products with  nicotine or tobacco in them. If you need help quitting, talk with your provider. Keep all follow-up visits. Your provider will monitor your pain and try other treatments if needed. Contact a health care provider if: You have knee pain that is not getting better or gets worse. You are not able to do your exercises due to knee pain. Get help right away if: Your knee swells and the swelling becomes worse. You cannot move your knee. You have severe knee pain. This information is not intended to replace advice given to you by your health care provider. Make sure you discuss any questions you have with your health care provider. Document Revised: 03/31/2023 Document Reviewed: 08/23/2022 Elsevier Patient Education  2024 [employer].

## 2024-07-03 ENCOUNTER — Ambulatory Visit (INDEPENDENT_AMBULATORY_CARE_PROVIDER_SITE_OTHER): Admitting: Nurse Practitioner

## 2024-07-03 ENCOUNTER — Encounter: Payer: Self-pay | Admitting: Nurse Practitioner

## 2024-07-03 VITALS — BP 128/87 | HR 73 | Temp 97.5°F | Resp 17 | Ht 65.98 in | Wt 240.6 lb

## 2024-07-03 DIAGNOSIS — N644 Mastodynia: Secondary | ICD-10-CM

## 2024-07-03 DIAGNOSIS — E063 Autoimmune thyroiditis: Secondary | ICD-10-CM | POA: Diagnosis not present

## 2024-07-03 DIAGNOSIS — G8929 Other chronic pain: Secondary | ICD-10-CM

## 2024-07-03 DIAGNOSIS — K219 Gastro-esophageal reflux disease without esophagitis: Secondary | ICD-10-CM | POA: Diagnosis not present

## 2024-07-03 DIAGNOSIS — M25562 Pain in left knee: Secondary | ICD-10-CM | POA: Diagnosis not present

## 2024-07-03 DIAGNOSIS — M545 Low back pain, unspecified: Secondary | ICD-10-CM

## 2024-07-03 MED ORDER — PANTOPRAZOLE SODIUM 20 MG PO TBEC: 20.0000 mg | DELAYED_RELEASE_TABLET | Freq: Two times a day (BID) | ORAL | 4 refills | Status: DC

## 2024-07-03 NOTE — Assessment & Plan Note (Signed)
 Chronic with acute flare at present, but worsening moving upwards. Will obtain imaging of lumbar and thoracic spine to further assess. With OSA would avoid muscle relaxer and pain medications. Continue OTC regimen, including Voltaren gel and Tylenol  as needed. Heating pad as needed. Ensure daily stretching at home. Determine next steps after imaging returns. PT may be beneficial. No red flags.

## 2024-07-03 NOTE — Progress Notes (Signed)
 "  BP 128/87 (BP Location: Left Arm, Patient Position: Sitting, Cuff Size: Normal)   Pulse 73   Temp (!) 97.5 F (36.4 C) (Oral)   Resp 17   Ht 5' 5.98 (1.676 m)   Wt 240 lb 9.6 oz (109.1 kg)   SpO2 97%   BMI 38.85 kg/m    Subjective:    Patient ID: Lori Knight, female    DOB: 1970/11/19, 53 y.o.   MRN: [id number]  HPI: Lori Knight is a 53 y.o. female  Chief Complaint  Patient presents with   Back Pain    Back is hurting all the way up her back, husband says when he is putting the cream on back from pain, he can feel that she is hot all over   Abdominal Pain    Abdominal discomfort anything that touches her stomach makes her nauseous    Knee Pain    Left knee pain    Breast Problem    Soreness up under left breath, Has had some thing up under breast before that she had to have removed by surgeon    BACK PAIN Lower mid back pain has been going all the way up back to her shoulders -- 2 months ongoing and worse over past 2 weeks.  Her husband reports her back is hot. She reports having imaging at [facility] on 04/30/24, not available in chart.   Has noticed she has stomach discomfort under her breasts when anything pressure wise touches area. Gets very nauseous and feels like she will puke, but does not puke. Continues to have constipation all the time, but not as excessive as when was on Mounjaro . Takes gummy fibers daily. Takes Omeprazole  daily for reflux, but does notice tenderness in epigastric area. Has underlying thyroid  disease. Duration: months Mechanism of injury: unknown Location: midline, bilateral, and low back and thoracic Onset: gradual Severity: 10/10 Quality: dull, aching, shooting, and throbbing on fire upward pain Frequency: intermittent Radiation: none Aggravating factors: sitting on the couch Alleviating factors: Bengay and Naproxen  Status: fluctuating Treatments attempted: Bengay and Naproxen   Relief with NSAIDs?: moderate Nighttime pain:  yes --  getting in and out of bed Paresthesias / decreased sensation:  no Bowel / bladder incontinence:  no Fevers:  no Dysuria / urinary frequency:  no   KNEE PAIN (LEFT) Is off and on at baseline. Over past months getting off couch had to grab knee. This is stable at present, no pain today. Did have swelling. Duration: months Involved knee: left Mechanism of injury: unknown Location:anterior Onset: gradual Severity: 5/10  Quality:  dull, aching, and throbbing Frequency: constant Radiation: no Aggravating factors: getting up after sitting for a period  Alleviating factors: Naproxen   Status: stable Treatments attempted: Naproxen   Relief with NSAIDs?:  moderate Weakness with weight bearing or walking: no Sensation of giving way: no Locking: no Popping: no Bruising: no Swelling: yes Redness: no Paresthesias/decreased sensation: no Fevers: no   BREAST PAIN Having soreness left breast to area of previous cyst removal. Has been wearing a bra more often, this has helped area. Duration :days Location: left Onset: gradual Severity: 3/10 if touched, if not touched is not present Quality: sharp, aching, and throbbing Frequency: intermittent Redness: no Swelling: no Trauma: no trauma Breastfeeding: no Associated with menstral cycle: no Nipple discharge: no Breast lump: no Status: stable Treatments attempted: support from bra Previous mammogram: yes      07/03/2024    3:18 PM 05/23/2024    4:15 PM 03/16/2024  11:15 AM 01/24/2024    3:30 PM 10/31/2023   11:28 AM  Depression screen PHQ 2/9  Decreased Interest 0 0 1 1 0  Down, Depressed, Hopeless 0 0 1 0 0  PHQ - 2 Score 0 0 2 1 0  Altered sleeping 0 1 0 1 2  Tired, decreased energy 0 1 2 2 2   Change in appetite 0 3 2 2 2   Feeling bad or failure about yourself  0 0 0 0 0  Trouble concentrating 0 1 2 1 1   Moving slowly or fidgety/restless 0 0 0 0 0  Suicidal thoughts 0 0 0 0 0  PHQ-9 Score 0 6 8  7  7    Difficult doing  work/chores  Not difficult at all  Not difficult at all Not difficult at all     Data saved with a previous flowsheet row definition       07/03/2024    3:18 PM 05/23/2024    4:16 PM 03/16/2024   11:15 AM 01/24/2024    3:30 PM  GAD 7 : Generalized Anxiety Score  Nervous, Anxious, on Edge 0 2 1 2   Control/stop worrying 0 1 1 1   Worry too much - different things 0 1 1 1   Trouble relaxing 0 1 1 1   Restless 0 1 1 1   Easily annoyed or irritable 0 1 1 2   Afraid - awful might happen 0 0 0 2  Total GAD 7 Score 0 7 6 10   Anxiety Difficulty  Not difficult at all  Not difficult at all      Relevant past medical, surgical, family and social history reviewed and updated as indicated. Interim medical history since our last visit reviewed. Allergies and medications reviewed and updated.  Review of Systems  Constitutional:  Negative for activity change, appetite change, diaphoresis, fatigue and fever.  Respiratory:  Negative for cough, chest tightness, shortness of breath and wheezing.   Cardiovascular:  Negative for chest pain, palpitations and leg swelling.  Gastrointestinal:  Positive for abdominal pain, constipation and nausea. Negative for abdominal distention, diarrhea and vomiting.  Musculoskeletal:  Positive for arthralgias.  Neurological: Negative.   Psychiatric/Behavioral: Negative.      Per HPI unless specifically indicated above     Objective:    BP 128/87 (BP Location: Left Arm, Patient Position: Sitting, Cuff Size: Normal)   Pulse 73   Temp (!) 97.5 F (36.4 C) (Oral)   Resp 17   Ht 5' 5.98 (1.676 m)   Wt 240 lb 9.6 oz (109.1 kg)   SpO2 97%   BMI 38.85 kg/m   Wt Readings from Last 3 Encounters:  07/03/24 240 lb 9.6 oz (109.1 kg)  05/23/24 237 lb (107.5 kg)  03/16/24 233 lb (105.7 kg)    Physical Exam Vitals and nursing note reviewed.  Constitutional:      General: She is awake. She is not in acute distress.    Appearance: She is well-developed and well-groomed.  She is obese. She is not ill-appearing or toxic-appearing.  HENT:     Head: Normocephalic.     Right Ear: Hearing and external ear normal.     Left Ear: Hearing and external ear normal.  Eyes:     General: Lids are normal.        Right eye: No discharge.        Left eye: No discharge.     Conjunctiva/sclera: Conjunctivae normal.     Pupils: Pupils are equal, round, and reactive  to light.  Neck:     Thyroid : No thyromegaly.     Vascular: No carotid bruit.  Cardiovascular:     Rate and Rhythm: Normal rate and regular rhythm.     Heart sounds: Normal heart sounds. No murmur heard.    No gallop.  Pulmonary:     Effort: Pulmonary effort is normal. No accessory muscle usage or respiratory distress.     Breath sounds: Normal breath sounds. No decreased breath sounds, wheezing or rales.  Chest:     Comments: No tenderness under left breast on palpation. No redness, edema, or warmth. Old incision area present. No rashes. Abdominal:     General: Bowel sounds are normal. There is no distension.     Palpations: Abdomen is soft.     Tenderness: There is abdominal tenderness in the epigastric area. There is no right CVA tenderness or left CVA tenderness.  Musculoskeletal:     Cervical back: Normal range of motion and neck supple.     Thoracic back: No swelling, spasms or tenderness. Normal range of motion.     Lumbar back: No swelling or tenderness. Normal range of motion. Negative right straight leg raise test and negative left straight leg raise test.     Right knee: Normal.     Left knee: No swelling or crepitus. Normal range of motion. No tenderness.     Right lower leg: No edema.     Left lower leg: No edema.     Comments: No rashes noted to back area.  Lymphadenopathy:     Cervical: No cervical adenopathy.  Skin:    General: Skin is warm and dry.  Neurological:     Mental Status: She is alert and oriented to person, place, and time.     Deep Tendon Reflexes: Reflexes are normal and  symmetric.     Reflex Scores:      Brachioradialis reflexes are 2+ on the right side and 2+ on the left side.      Patellar reflexes are 2+ on the right side and 2+ on the left side. Psychiatric:        Attention and Perception: Attention normal.        Mood and Affect: Mood normal.        Speech: Speech normal.        Behavior: Behavior normal. Behavior is cooperative.        Thought Content: Thought content normal.    Results for orders placed or performed in visit on 05/24/24  OPHTHALMOLOGY REPORT-SCANNED   Collection Time: 03/19/24  9:19 AM  Result Value Ref Range   HM Diabetic Eye Exam     A Comment        Assessment & Plan:   Problem List Items Addressed This Visit       Digestive   Gastroesophageal reflux disease without esophagitis   Chronic, ongoing.  ?if current upper abdominal pain related to reflux issues, tender to epigastric area. Will stop Omeprazole  and trial change to Protonix  20 MG BID to see if more benefit. Recommend keeping food journal to assess for triggers. Avoid any foods that cause worsening symptoms. Return as scheduled in February, sooner if worsening. Discussed with her GLP1 medications can cause worsening reflux issues.      Relevant Medications   pantoprazole  (PROTONIX ) 20 MG tablet     Endocrine   Hashimoto's thyroiditis   Chronic, ongoing.  Continue Levothyroxine  150 MCG daily.  Educated her on this.  Thyroid  labs  today to assess due to current symptoms.      Relevant Orders   T4, free   TSH     Other   Chronic midline low back pain without sciatica - Primary   Chronic with acute flare at present, but worsening moving upwards. Will obtain imaging of lumbar and thoracic spine to further assess. With OSA would avoid muscle relaxer and pain medications. Continue OTC regimen, including Voltaren gel and Tylenol  as needed. Heating pad as needed. Ensure daily stretching at home. Determine next steps after imaging returns. PT may be beneficial. No  red flags.      Relevant Orders   DG Lumbar Spine Complete   DG Thoracic Spine W/Swimmers   Breast pain, left   To area of previous cyst removal, is finding benefit from wearing bra more often with support. Recommend continue this. No masses noted on exam or rashes. Suspect more muscular or nerve pain to past incision area.      Acute pain of left knee   Off and on at baseline but worse over past months. At this time no current pain. Will check uric acid level on labs today to r/o gouty arthritis. Obtain imaging of knee. Continue OTC regimen at home, including Voltaren gel. Would benefit compression sleeve to support knee if pain returns. Consider PT in future.      Relevant Orders   Uric acid   DG Knee Complete 4 Views Left    I personally spent a total of 25 minutes in the care of the patient today including preparing to see the patient, getting/reviewing separately obtained history, performing a medically appropriate exam/evaluation, counseling and educating, placing orders, documenting clinical information in the EHR, and coordinating care.   Follow up plan: Return for as scheduled in February, sooner if worsening symptoms.      "

## 2024-07-03 NOTE — Assessment & Plan Note (Signed)
 To area of previous cyst removal, is finding benefit from wearing bra more often with support. Recommend continue this. No masses noted on exam or rashes. Suspect more muscular or nerve pain to past incision area.

## 2024-07-03 NOTE — Assessment & Plan Note (Signed)
 Off and on at baseline but worse over past months. At this time no current pain. Will check uric acid level on labs today to r/o gouty arthritis. Obtain imaging of knee. Continue OTC regimen at home, including Voltaren gel. Would benefit compression sleeve to support knee if pain returns. Consider PT in future.

## 2024-07-03 NOTE — Assessment & Plan Note (Signed)
 Chronic, ongoing.  Continue Levothyroxine  150 MCG daily.  Educated her on this.  Thyroid  labs today to assess due to current symptoms.

## 2024-07-03 NOTE — Assessment & Plan Note (Signed)
 Chronic, ongoing.  ?if current upper abdominal pain related to reflux issues, tender to epigastric area. Will stop Omeprazole  and trial change to Protonix  20 MG BID to see if more benefit. Recommend keeping food journal to assess for triggers. Avoid any foods that cause worsening symptoms. Return as scheduled in February, sooner if worsening. Discussed with her GLP1 medications can cause worsening reflux issues.

## 2024-07-04 ENCOUNTER — Ambulatory Visit: Payer: Self-pay | Admitting: Nurse Practitioner

## 2024-07-04 LAB — TSH: TSH: 0.65 u[IU]/mL (ref 0.450–4.500)

## 2024-07-04 LAB — URIC ACID: Uric Acid: 3.3 mg/dL (ref 3.0–7.2)

## 2024-07-04 LAB — T4, FREE: Free T4: 1.38 ng/dL (ref 0.82–1.77)

## 2024-07-04 NOTE — Progress Notes (Signed)
 Contacted via MyChart  Good morning Lori Knight, your labs have returned and overall are stable. No changes needed. Any questions? Keep being amazing!!  Thank you for allowing me to participate in your care.  I appreciate you. Kindest regards, Alban Marucci

## 2024-07-10 ENCOUNTER — Ambulatory Visit
Admission: RE | Admit: 2024-07-10 | Discharge: 2024-07-10 | Disposition: A | Discharge status: Home / Self Care | Source: Ambulatory Visit | Attending: Nurse Practitioner | Admitting: Nurse Practitioner

## 2024-07-10 ENCOUNTER — Ambulatory Visit
Admission: RE | Admit: 2024-07-10 | Discharge: 2024-07-10 | Disposition: A | Discharge status: Home / Self Care | Attending: Nurse Practitioner | Admitting: Nurse Practitioner

## 2024-07-10 DIAGNOSIS — M25562 Pain in left knee: Secondary | ICD-10-CM

## 2024-07-10 DIAGNOSIS — M545 Low back pain, unspecified: Secondary | ICD-10-CM

## 2024-07-13 ENCOUNTER — Encounter: Payer: Self-pay | Admitting: Nurse Practitioner

## 2024-07-16 ENCOUNTER — Other Ambulatory Visit (HOSPITAL_COMMUNITY): Payer: Self-pay

## 2024-07-16 ENCOUNTER — Telehealth: Payer: Self-pay

## 2024-07-16 NOTE — Telephone Encounter (Signed)
 Pharmacy Patient Advocate Encounter   Received notification from Patient Advice Request messages that prior authorization for Trulicity  4.5MG /0.5ML auto-injectors is required/requested.   Insurance verification completed.   The patient is insured through [facility].   Per test claim: PA required; PA submitted to above mentioned insurance via CoverMyMeds Key/confirmation #/EOC [id number] Status is pending

## 2024-07-17 ENCOUNTER — Other Ambulatory Visit (HOSPITAL_COMMUNITY): Payer: Self-pay

## 2024-07-17 NOTE — Telephone Encounter (Signed)
 Pharmacy Patient Advocate Encounter  Received notification from [employer] that Prior Authorization for Trulicity  4.5MG /0.5ML auto-injectors  has been APPROVED from 07/16/24 to 07/16/25   PA #/Case ID/Reference #: [id number]  -Patient has a deductible to be met with insurance.

## 2024-07-23 NOTE — Progress Notes (Signed)
 Contacted via MyChart  Good morning Lemya, your imaging has returned: - Lower back shows no abnormal findings, but mid back does note moderate degenerative changes which in turn can cause pain down to lower back area. Arthritis. - Left knee does show a exostosis, or bone spur, this is a benign bony growth and can be related to arthritis as well. I would recommend a visit with ortho or physical therapy if you continue to have pain. Any questions? Keep being amazing!!  Thank you for allowing me to participate in your care.  I appreciate you. Kindest regards, Koby Hartfield

## 2024-07-23 NOTE — Progress Notes (Signed)
 Wrote via other results.

## 2024-07-25 ENCOUNTER — Ambulatory Visit: Admitting: Nurse Practitioner

## 2024-07-27 ENCOUNTER — Other Ambulatory Visit (HOSPITAL_COMMUNITY): Payer: Self-pay

## 2024-08-01 ENCOUNTER — Other Ambulatory Visit: Payer: Self-pay | Admitting: Nurse Practitioner

## 2024-08-01 NOTE — Telephone Encounter (Signed)
 Requested Prescriptions  Pending Prescriptions Disp Refills   metFORMIN  (GLUCOPHAGE ) 500 MG tablet [Pharmacy Med Name: METFORMIN  HCL 500 MG TABLET] 60 tablet 0    Sig: TAKE 1 TABLET BY MOUTH TWICE A DAY     Endocrinology:  Diabetes - Biguanides Failed - 08/01/2024 12:00 PM      Failed - CBC within normal limits and completed in the last 12 months    WBC  Date Value Ref Range Status  02/23/2023 9.0 3.4 - 10.8 x10E3/uL Final  07/13/2020 7.0 4.0 - 10.5 K/uL Final   RBC  Date Value Ref Range Status  02/23/2023 5.10 3.77 - 5.28 x10E6/uL Final  07/13/2020 4.88 3.87 - 5.11 MIL/uL Final   Hemoglobin  Date Value Ref Range Status  02/23/2023 14.0 11.1 - 15.9 g/dL Final   Hematocrit  Date Value Ref Range Status  02/23/2023 43.4 34.0 - 46.6 % Final   MCHC  Date Value Ref Range Status  02/23/2023 32.3 31.5 - 35.7 g/dL Final  98/97/7977 67.8 30.0 - 36.0 g/dL Final   [facility]  Date Value Ref Range Status  02/23/2023 27.5 26.6 - 33.0 pg Final  07/13/2020 27.5 26.0 - 34.0 pg Final   MCV  Date Value Ref Range Status  02/23/2023 85 79 - 97 fL Final   No results found for: PLTCOUNTKUC, LABPLAT, POCPLA RDW  Date Value Ref Range Status  02/23/2023 13.3 11.7 - 15.4 % Final         Passed - Cr in normal range and within 360 days    Creatinine, Ser  Date Value Ref Range Status  05/23/2024 0.75 0.57 - 1.00 mg/dL Final         Passed - HBA1C is between 0 and 7.9 and within 180 days    Hemoglobin A1C  Date Value Ref Range Status  03/19/2020 6.7%  Final   HB A1C (BAYER DCA - WAIVED)  Date Value Ref Range Status  05/23/2024 6.1 (H) 4.8 - 5.6 % Final    Comment:             Prediabetes: 5.7 - 6.4          Diabetes: >6.4          Glycemic control for adults with diabetes: <7.0          Passed - eGFR in normal range and within 360 days    GFR calc Af Amer  Date Value Ref Range Status  02/20/2020 85 >59 mL/min/1.73 Final    Comment:    **[facility] currently reports eGFR in  compliance with the current**   recommendations of the [employer]. [facility] will   update reporting as new guidelines are published from the NKF-ASN   Task force.    GFR calc non Af Amer  Date Value Ref Range Status  02/20/2020 74 >59 mL/min/1.73 Final   eGFR  Date Value Ref Range Status  05/23/2024 95 >59 mL/min/1.73 Final         Passed - B12 Level in normal range and within 720 days    Vitamin B-12  Date Value Ref Range Status  05/23/2024 1,115 232 - 1,245 pg/mL Final         Passed - Valid encounter within last 6 months    Recent Outpatient Visits           4 weeks ago Chronic midline low back pain without sciatica   [place] [facility] [place], Melanie T, NP   2 months ago  Type 2 diabetes mellitus with proteinuria (HCC)   [place] [facility] Haskins, Ossian T, NP   4 months ago Urinary symptom or sign   [place] [facility] [place], [place] T, NP   6 months ago Type 2 diabetes mellitus with morbid obesity (HCC)   [place] [facility] [place], Ehrhardt T, NP   9 months ago Class 2 severe obesity due to excess calories with serious comorbidity and body mass index (BMI) of 37.0 to 37.9 in adult   [facility] [place], Melanie T, NP               losartan  (COZAAR ) 50 MG tablet [Pharmacy Med Name: LOSARTAN  POTASSIUM 50 MG TAB] 60 tablet 0    Sig: TAKE 2 TABLETS BY MOUTH EVERY DAY     Cardiovascular:  Angiotensin Receptor Blockers Passed - 08/01/2024 12:00 PM      Passed - Cr in normal range and within 180 days    Creatinine, Ser  Date Value Ref Range Status  05/23/2024 0.75 0.57 - 1.00 mg/dL Final         Passed - K in normal range and within 180 days    Potassium  Date Value Ref Range Status  05/23/2024 3.9 3.5 - 5.2 mmol/L Final         Passed - Patient is not pregnant      Passed - Last BP in normal range    BP Readings from Last 1 Encounters:  07/03/24  128/87         Passed - Valid encounter within last 6 months    Recent Outpatient Visits           4 weeks ago Chronic midline low back pain without sciatica   [facility] [place], Melanie T, NP   2 months ago Type 2 diabetes mellitus with proteinuria (HCC)   [place] [facility] [place], [place] T, NP   4 months ago Urinary symptom or sign   [place] [facility] [place], [place] T, NP   6 months ago Type 2 diabetes mellitus with morbid obesity (HCC)   [place], [place] T, NP   9 months ago Class 2 severe obesity due to excess calories with serious comorbidity and body mass index (BMI) of 37.0 to 37.9 in adult   [facility] [place], Melanie DASEN, NP

## 2024-08-04 ENCOUNTER — Encounter: Payer: Self-pay | Admitting: Nurse Practitioner

## 2024-08-04 DIAGNOSIS — I152 Hypertension secondary to endocrine disorders: Secondary | ICD-10-CM

## 2024-08-07 MED ORDER — TRULICITY 4.5 MG/0.5ML ~~LOC~~ SOAJ: 4.5000 mg | SUBCUTANEOUS | 4 refills | Status: DC

## 2024-08-07 MED ORDER — PANTOPRAZOLE SODIUM 20 MG PO TBEC: 20.0000 mg | DELAYED_RELEASE_TABLET | Freq: Two times a day (BID) | ORAL | 4 refills | Status: AC

## 2024-08-07 MED ORDER — LEVOTHYROXINE SODIUM 150 MCG PO TABS: 150.0000 ug | ORAL_TABLET | Freq: Every day | ORAL | 2 refills | Status: AC

## 2024-08-07 MED ORDER — ROSUVASTATIN CALCIUM 40 MG PO TABS: 40.0000 mg | ORAL_TABLET | Freq: Every day | ORAL | 3 refills | Status: AC

## 2024-08-07 MED ORDER — METFORMIN HCL 500 MG PO TABS: 500.0000 mg | ORAL_TABLET | Freq: Two times a day (BID) | ORAL | 0 refills | Status: AC

## 2024-08-07 MED ORDER — AMLODIPINE BESYLATE 5 MG PO TABS: 5.0000 mg | ORAL_TABLET | Freq: Every day | ORAL | 12 refills | Status: AC

## 2024-08-07 MED ORDER — LOSARTAN POTASSIUM 50 MG PO TABS: 100.0000 mg | ORAL_TABLET | Freq: Every day | ORAL | 0 refills | Status: AC

## 2024-08-09 MED ORDER — TRULICITY 4.5 MG/0.5ML ~~LOC~~ SOAJ: 4.5000 mg | SUBCUTANEOUS | 4 refills | Status: AC

## 2024-08-09 NOTE — Addendum Note (Signed)
 Addended by: Terrill Wauters T on: 08/09/2024 12:33 PM   Modules accepted: Orders

## 2024-08-15 ENCOUNTER — Ambulatory Visit: Admitting: Psychology

## 2024-08-15 DIAGNOSIS — F3110 Bipolar disorder, current episode manic without psychotic features, unspecified: Secondary | ICD-10-CM

## 2024-08-15 DIAGNOSIS — F309 Manic episode, unspecified: Secondary | ICD-10-CM

## 2024-08-15 NOTE — Progress Notes (Signed)
 Comprehensive Clinical Assessment (CCA) Note  08/15/2024 Lori Knight [id number]  Time Spent: 8:06 am - 8:48 am: 42 Minutes  Chief Complaint: No chief complaint on file.  Visit Diagnosis: Bipolar Dx (by history and record)   Guardian/Payee:  self    Paperwork requested: No   Reason for Visit /Presenting Problem: F30.9  Mental Status Exam: Appearance:   Casual     Behavior:  Appropriate  Motor:  Normal  Speech/Language:   Clear and Coherent  Affect:  Congruent  Mood:  normal  Thought process:  normal  Thought content:    WNL  Sensory/Perceptual disturbances:    WNL  Orientation:  oriented to person, place, time/date, and situation  Attention:  Good  Concentration:  Good  Memory:  WNL  Fund of knowledge:   Good  Insight:    Good  Judgment:   Good  Impulse Control:  Good   Reported Symptoms:  Bipolar  Risk Assessment: Danger to Self:  No Self-injurious Behavior: No Danger to Others: No Duty to Warn:no Physical Aggression / Violence:No  Access to Firearms a concern: No  Gang Involvement:No  Patient / guardian was educated about steps to take if suicide or homicide risk level increases between visits: n/a While future psychiatric events cannot be accurately predicted, the patient does not currently require acute inpatient psychiatric care and does not currently meet Hoffman  involuntary commitment criteria.  Substance Abuse History: Current substance abuse: No     Caffeine: 1x chai tea latte Tobacco: denied. Alcohol: denied.  Substance use: denied.   Past Psychiatric History:   Previous psychological history is significant for Bipolar. Outpatient Providers:Daniel Norris, M.D. ([facility]) and Maylene Crocker, LCSW [facility] Ronal Landry [place], [place], [place] speech therapy. Hemang K. Maree, MD (neurologist).  History of Psych Hospitalization: yes, at 17, no hospitalization but had pumped stomach.  Psychological Testing: Hemang K. Maree, MD   Abuse History:   Victim of: No., na   Report needed: No. Victim of Neglect:No. Perpetrator of NA  Witness / Exposure to Domestic Violence: No   Protective Services Involvement: No  Witness to [employer] Violence:  No   Family History:  Family History  Problem Relation Age of Onset   Diabetes Maternal Grandmother    Hypertension Maternal Grandmother    Hyperlipidemia Maternal Grandmother    Hypothyroidism Maternal Grandmother    Heart disease Maternal Grandmother    Diabetes Mother    Hypertension Mother    Hypothyroidism Mother    Bipolar disorder Mother    Heart disease Mother    Kidney disease Mother    Thyroid  disease Mother    Depression Mother    Anxiety disorder Mother    Drug abuse Mother    Obesity Mother    Alcohol abuse Father    Depression Daughter    Breast cancer Neg Hx    Living situation: the patient lives with their spouse  Sexual Orientation: Straight  Relationship Status: married  Name of spouse / other: Rolan (Together~14 years and married ~1.5 years) If a parent, number of children / ages: Melina (68) (Depression, ADHD)  Support Systems: spouse  Surveyor, Quantity Stress:  No . Noted having to pay taxes and noted making more this year than last.   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   Military Service: No   Educational History: Education: college graduate  Religion/Sprituality/World View: Christian  Any cultural differences that may affect / interfere with treatment:  not applicable   Recreation/Hobbies: Volunteering, rescue dogs and cats, care for animals,  video game (Fallout 76), & traveling.  Stressors: Other: Work Stressors (did not change positions), Forensic scientist and the effect on coverage.     Strengths: Supportive Relationships, Family, Friends, Charity Fundraiser, Spirituality, Hopefulness, Journalist, Newspaper, and Able to Communicate Effectively  Barriers:  Finances and mood.    Legal History: Pending legal issue /  charges: The patient has no significant history of legal issues. History of legal issue / charges: NA  Medical History/Surgical History: reviewed Past Medical History:  Diagnosis Date   Adult hypothyroidism 06/21/2012   Anxiety    Back pain    Benign paroxysmal positional nystagmus    Bilateral polycystic ovarian syndrome 06/21/2012   Biliary calculus with cholecystitis 08/14/2012   Bipolar 1 disorder (HCC)    BP (high blood pressure) 02/14/2014   Bronchitis    Constipation    COVID-19 07/2020   Depression    Diabetes mellitus (HCC) 08/14/2012   Diabetes mellitus without complication (HCC)    pre-diabetic   Dysplastic nevus 2023   posterior pelvis sacral area-mild atypia   Dysplastic nevus 2019   right suprascapular back-mild atypia   Dysplastic nevus 2019   left posterior thigh - mild atypia   Dysrhythmia    wore heart monitor 2016. Corrected by changing Levothyroxine  dose.   Edema of both lower extremities    Gallbladder problem    GERD (gastroesophageal reflux disease)    Heart murmur    followed by PCP-AS A CHILD-ASYMPTOMATIC   Hyperlipidemia    Hyperprolactinemia 06/21/2012   Hypertension    Hypothyroid    Hypoxia 04/17/2018   Joint pain    Kidney problem    Meningioma (HCC)    Motion sickness    carnival rides   Neuropathy    PONV (postoperative nausea and vomiting)    Low BP after sinus surgery. WAKES UP CRYING   Shortness of breath dyspnea    stairs. related to wt.   Sleep apnea    has CPAP. has not used since 2011   Swallowing difficulty    Vertigo 2016   none recently   Vitamin D  deficiency     Past Surgical History:  Procedure Laterality Date   CHOLECYSTECTOMY     COLONOSCOPY WITH PROPOFOL  N/A 06/19/2015   Procedure: COLONOSCOPY WITH PROPOFOL ;  Surgeon: Rogelia Copping, MD;  [facility];  Service: Endoscopy;  Laterality: N/A;  Diabetic - oral meds    DENTAL SURGERY     ESOPHAGOGASTRODUODENOSCOPY (EGD) WITH PROPOFOL  N/A  01/16/2021   Procedure: ESOPHAGOGASTRODUODENOSCOPY (EGD) WITH PROPOFOL ;  Surgeon: Copping Rogelia, MD;  Location: [facility];  Service: Endoscopy;  Laterality: N/A;  Diabetic - oral meds   ETHMOIDECTOMY Bilateral 04/17/2018   Procedure: ETHMOIDECTOMY;  Surgeon: Edda Mt, MD;  Location: [facility];  Service: ENT;  Laterality: Bilateral;   GALLBLADDER SURGERY     IMAGE GUIDED SINUS SURGERY N/A 04/17/2018   Procedure: IMAGE GUIDED SINUS SURGERY;  Surgeon: Edda Mt, MD;  Location: [facility];  Service: ENT;  Laterality: N/A;   MAXILLARY ANTROSTOMY Bilateral 04/17/2018   Procedure: MAXILLARY ANTROSTOMY;  Surgeon: Edda Mt, MD;  Location: [facility];  Service: ENT;  Laterality: Bilateral;   NASAL SEPTOPLASTY W/ TURBINOPLASTY Bilateral 04/17/2018   Procedure: NASAL SEPTOPLASTY WITH TURBINATE REDUCTION;  Surgeon: Edda Mt, MD;  Location: [facility];  Service: ENT;  Laterality: Bilateral;   TOTAL ABDOMINAL HYSTERECTOMY  2012   cervical dysplasia/ovaries remian   TRANSURETHRAL RESECTION OF BLADDER TUMOR N/A 04/17/2018   Procedure: NTRANSURETHRAL RESECTION  OF BLADDER TUMOR (TURBT) WITH GEMCITABINE ;  Surgeon: Penne Knee, MD;  Location: [facility];  Service: Urology;  Laterality: N/A;    Medications: Current Outpatient Medications  Medication Sig Dispense Refill   acetaminophen  (TYLENOL ) 500 MG tablet Take 500 mg by mouth every 6 (six) hours as needed.     albuterol  (VENTOLIN  HFA) 108 (90 Base) MCG/ACT inhaler Inhale 1-2 puffs into the lungs every 6 (six) hours as needed for wheezing or shortness of breath. 1 each 0   amLODipine  (NORVASC ) 5 MG tablet Take 1 tablet (5 mg total) by mouth daily. 30 tablet 12   Ascorbic Acid (VITAMIN C) 1000 MG tablet Take 1,000 mg by mouth daily.     baclofen  (LIORESAL ) 10 MG tablet Take 1 tablet (10 mg total) by mouth 3 (three) times daily as needed for muscle spasms. 20 each 0   Cholecalciferol  5000 units capsule Take 1 capsule (5,000 Units total) by  mouth daily.     Dulaglutide  (TRULICITY ) 4.5 MG/0.5ML SOAJ Inject 4.5 mg as directed once a week. 6 mL 4   EPINEPHrine  0.3 mg/0.3 mL IJ SOAJ injection Inject 0.3 mg into the muscle as needed for anaphylaxis. 1 each 4   glucose blood (CONTOUR NEXT TEST) test strip USE TO CHECK BS UP TO 3 TIMES DAILY FOR DIABETES DX: E11.9 100 strip 12   ibuprofen  (ADVIL ) 400 MG tablet Take 1-2 tablets (400-800 mg total) by mouth every 8 (eight) hours as needed. 30 tablet 0   lamoTRIgine  (LAMICTAL ) 150 MG tablet Take 1 tablet (150 mg total) by mouth at bedtime.     lamoTRIgine  (LAMICTAL ) 200 MG tablet Take 200 mg by mouth every morning.     levothyroxine  (SYNTHROID ) 150 MCG tablet Take 1 tablet (150 mcg total) by mouth daily. 90 tablet 2   losartan  (COZAAR ) 50 MG tablet Take 2 tablets (100 mg total) by mouth daily. 60 tablet 0   metFORMIN  (GLUCOPHAGE ) 500 MG tablet Take 1 tablet (500 mg total) by mouth 2 (two) times daily. 60 tablet 0   mometasone (ELOCON) 0.1 % cream SMARTSIG:1 Application Topical     nystatin  (MYCOSTATIN /NYSTOP ) powder Apply 1 application. topically 3 (three) times daily. 15 g 4   Omega-3 Fatty Acids (FISH OIL) 1200 MG CAPS Take 1,200 mg by mouth daily.      ondansetron  (ZOFRAN -ODT) 4 MG disintegrating tablet TAKE 1 TABLET BY MOUTH EVERY 8 HOURS AS NEEDED FOR NAUSEA AND VOMITING 18 tablet 4   pantoprazole  (PROTONIX ) 20 MG tablet Take 1 tablet (20 mg total) by mouth 2 (two) times daily before a meal. 180 tablet 4   rosuvastatin  (CRESTOR ) 40 MG tablet Take 1 tablet (40 mg total) by mouth daily. 90 tablet 3   No current facility-administered medications for this visit.    Allergies  Allergen Reactions   Acyclovir And Related    Hctz [Hydrochlorothiazide ] Cough   Lisinopril Cough   Latex Rash    Condoms only   Prednisone Anxiety    Paranoia    Diagnoses:  Bipolar I disorder, single manic episode (HCC)  Plan of Care: Outpatient Therapy.   Narrative:   [name] participated  from home, via video, and consented to treatment. Therapist participated from home office.   We discussed the limits of confidentiality prior to the start of evaluation and Abbigael expressed understanding and willingness to proceed.  This is Joshlynn's annual reassessment. She was initially referred for counseling due to Bi-polar and stress. She noted continuing to see Viviane Drone, MD,  for psychiatric treatment, but noted seeing her quarterly due to having to pay out of pocket as Dr. Chipper does not accept [employer] insurance. Her medication regimen includes Lamotragin 200mg  am and 150mg  pm and takes her medication consistently and without concern. She has a history of diabetes, hyperlipidemia, acid reflux, thyroid , hypertension. She has recent labs with her provider, which are available it the chart.   Her depressive symptoms include feeling down, lethargy, and difficulty concentrating. We reviewed the results of her PHQ-9 and theapist highlighted two missing prompts. Nyaira denied feeling bad about self and denied any SI. She noted eating like crap again. She noted not eating fast food but eating various snacks and not maintaining her diet. She noted note being a meat eater and noted this contributing to an increase in carbs. She noted a need to improve her overall diet. Stressors include work related stressors and stressors related to insurance change, which has limited her access to providers and could put her at risk of having to pay back a large amount of money. Maleeyah noted recently considering leaving her current job and noted her consideration for the past year but noted electing to stay due to various issues including location and possible limitations. She noted work suspecting this possible change and this resulting in interpersonal stressors. She noted being offered improved compensation but noted this coming with more responsibility and pressure. She noted incoming work related changes due to her company being  bought out. She noted difficulty with changes and noted this affecting her mood. She noted having to change insurance and noted being quite limited with the various options and noted the cost increase along with possible penalties, should she earn past a specific limit. She noted the stress this resulted in. Additionally, she noted that her new plan does not provide the breadth of coverage as before, which resulted in having to pay for her psychiatric provider out of pocket. Royann continues to present as forthcoming and motivated for change. A follow-up was scheduled for continued treatment, beginning with the creation of a treatment plan.    Elvie Mullet, LCSW

## 2024-08-23 ENCOUNTER — Ambulatory Visit: Admitting: Nurse Practitioner

## 2024-08-29 ENCOUNTER — Ambulatory Visit: Admitting: Psychology
